# Patient Record
Sex: Female | Born: 1955 | Race: Black or African American | Hispanic: No | Marital: Single | Smoking: Never smoker
Health system: Southern US, Community
[De-identification: ages and names within clinical notes are randomized; demographics above are authoritative.]

## PROBLEM LIST (undated history)

## (undated) DIAGNOSIS — Z923 Personal history of irradiation: Secondary | ICD-10-CM

## (undated) DIAGNOSIS — M1990 Unspecified osteoarthritis, unspecified site: Secondary | ICD-10-CM

## (undated) DIAGNOSIS — E119 Type 2 diabetes mellitus without complications: Secondary | ICD-10-CM

## (undated) DIAGNOSIS — I10 Essential (primary) hypertension: Secondary | ICD-10-CM

## (undated) DIAGNOSIS — Z9221 Personal history of antineoplastic chemotherapy: Secondary | ICD-10-CM

## (undated) DIAGNOSIS — C801 Malignant (primary) neoplasm, unspecified: Secondary | ICD-10-CM

## (undated) DIAGNOSIS — C50919 Malignant neoplasm of unspecified site of unspecified female breast: Secondary | ICD-10-CM

## (undated) DIAGNOSIS — R519 Headache, unspecified: Secondary | ICD-10-CM

## (undated) DIAGNOSIS — K219 Gastro-esophageal reflux disease without esophagitis: Secondary | ICD-10-CM

## (undated) DIAGNOSIS — Z9289 Personal history of other medical treatment: Secondary | ICD-10-CM

## (undated) HISTORY — DX: Personal history of other medical treatment: Z92.89

## (undated) HISTORY — DX: Malignant neoplasm of unspecified site of unspecified female breast: C50.919

## (undated) HISTORY — DX: Type 2 diabetes mellitus without complications: E11.9

## (undated) HISTORY — DX: Essential (primary) hypertension: I10

## (undated) HISTORY — DX: Unspecified osteoarthritis, unspecified site: M19.90

---

## 2020-01-11 ENCOUNTER — Other Ambulatory Visit: Payer: Self-pay | Admitting: Family Medicine

## 2020-01-11 DIAGNOSIS — N6311 Unspecified lump in the right breast, upper outer quadrant: Secondary | ICD-10-CM

## 2020-01-18 ENCOUNTER — Other Ambulatory Visit: Payer: Self-pay

## 2020-01-18 DIAGNOSIS — N6310 Unspecified lump in the right breast, unspecified quadrant: Secondary | ICD-10-CM

## 2020-01-24 ENCOUNTER — Ambulatory Visit: Payer: Self-pay | Admitting: *Deleted

## 2020-01-24 ENCOUNTER — Other Ambulatory Visit: Payer: Self-pay

## 2020-01-24 VITALS — BP 122/86 | Temp 97.3°F | Wt 218.2 lb

## 2020-01-24 DIAGNOSIS — N6311 Unspecified lump in the right breast, upper outer quadrant: Secondary | ICD-10-CM

## 2020-01-24 DIAGNOSIS — N6315 Unspecified lump in the right breast, overlapping quadrants: Secondary | ICD-10-CM

## 2020-01-24 DIAGNOSIS — N644 Mastodynia: Secondary | ICD-10-CM

## 2020-01-24 DIAGNOSIS — Z1239 Encounter for other screening for malignant neoplasm of breast: Secondary | ICD-10-CM

## 2020-01-24 NOTE — Patient Instructions (Signed)
Explained breast self awareness with [name]. Patient did not need a Pap smear today due to last Pap smear and HPV typing was in 2017 per patient. Let her know [facility] will cover Pap smears and HPV typing every 5 years unless has a history of abnormal Pap smears. Referred patient to the [facility] for a diagnostic mammogram. Appointment scheduled Tuesday, January 31, 2020 at 1000. Patient aware of appointment and will be there. [name] verbalized understanding.  Shayleen Eppinger, Arvil Chaco, RN 1:17 PM

## 2020-01-24 NOTE — Progress Notes (Signed)
[name] is a 64 y.o. female who presents to [facility] clinic today with complaint of right breast lump and right axillary pain x 3 months. Patient states the pain comes and goes. Patient rates her pain at a 5 out of 10.    Pap Smear: Pap smear not completed today. Last Pap smear was in 2017 at a clinic in the [place] and was normalwith negative HPV per patient. Per patient has no history of an abnormal Pap smear. Last Pap smear result is not available in Epic.   Physical exam: Breasts Breasts symmetrical. No skin abnormalities bilateral breasts. No nipple retraction bilateral breasts. No nipple discharge bilateral breasts. No lymphadenopathy. No lumps palpated left breast. Palpated two lumps within the right breast at 12 o'clock 1 cm from the nipple and a mobile lump at 11 o'clock 1.5 cm from the nipple. Complaints of right outer breast and axillary tenderness on exam.      Pelvic/Bimanual Pap is not indicated today per [facility] guidelines.   Smoking History: Patient has never smoked.   Patient Navigation: Patient education provided. Access to services provided for patient through [facility] program.   Colorectal Cancer Screening: Per patient has never had colonoscopy completed. No complaints today.    Breast and Cervical Cancer Risk Assessment: Patient does not have family history of breast cancer, known genetic mutations, or radiation treatment to the chest before age 80. Patient does not have history of cervical dysplasia, immunocompromised, or DES exposure in-utero.  Risk Assessment    Risk Scores      01/24/2020   Last edited by: Demetrius Revel, LPN   5-year risk: 1.5 %   Lifetime risk: 6.7 %          A: [facility] exam without pap smear Complaint of right breast lump and pain x 3 months.  P: Referred patient to the [facility] for a diagnostic mammogram. Appointment scheduled Tuesday, January 31, 2020 at 1000.  Loletta Parish, RN 01/24/2020 1:17 PM

## 2020-01-31 ENCOUNTER — Other Ambulatory Visit: Payer: Self-pay | Admitting: Obstetrics and Gynecology

## 2020-01-31 ENCOUNTER — Other Ambulatory Visit: Payer: Self-pay

## 2020-01-31 ENCOUNTER — Ambulatory Visit: Payer: Self-pay

## 2020-01-31 ENCOUNTER — Ambulatory Visit
Admission: RE | Admit: 2020-01-31 | Discharge: 2020-01-31 | Disposition: A | Discharge status: Home / Self Care | Payer: No Typology Code available for payment source | Source: Ambulatory Visit | Attending: Obstetrics and Gynecology | Admitting: Obstetrics and Gynecology

## 2020-01-31 ENCOUNTER — Ambulatory Visit
Admission: RE | Admit: 2020-01-31 | Discharge: 2020-01-31 | Disposition: A | Discharge status: Home / Self Care | Payer: Self-pay | Source: Ambulatory Visit | Attending: Obstetrics and Gynecology | Admitting: Obstetrics and Gynecology

## 2020-01-31 DIAGNOSIS — R928 Other abnormal and inconclusive findings on diagnostic imaging of breast: Secondary | ICD-10-CM

## 2020-01-31 DIAGNOSIS — N6310 Unspecified lump in the right breast, unspecified quadrant: Secondary | ICD-10-CM

## 2020-01-31 IMAGING — US US BREAST*L* LIMITED INC AXILLA
1 series · 5 of 5 positions shown · non-contrast
Comparison: None.

CLINICAL DATA: Patient presents for palpable mass within the right
axilla.

EXAM:
DIGITAL DIAGNOSTIC BILATERAL MAMMOGRAM WITH CAD AND TOMO
ULTRASOUND BILATERAL BREAST

[Series 1: us breast*left* limited inc axilla · 0.06mm/px · 5 of 5 slices shown]
[im 1/5]
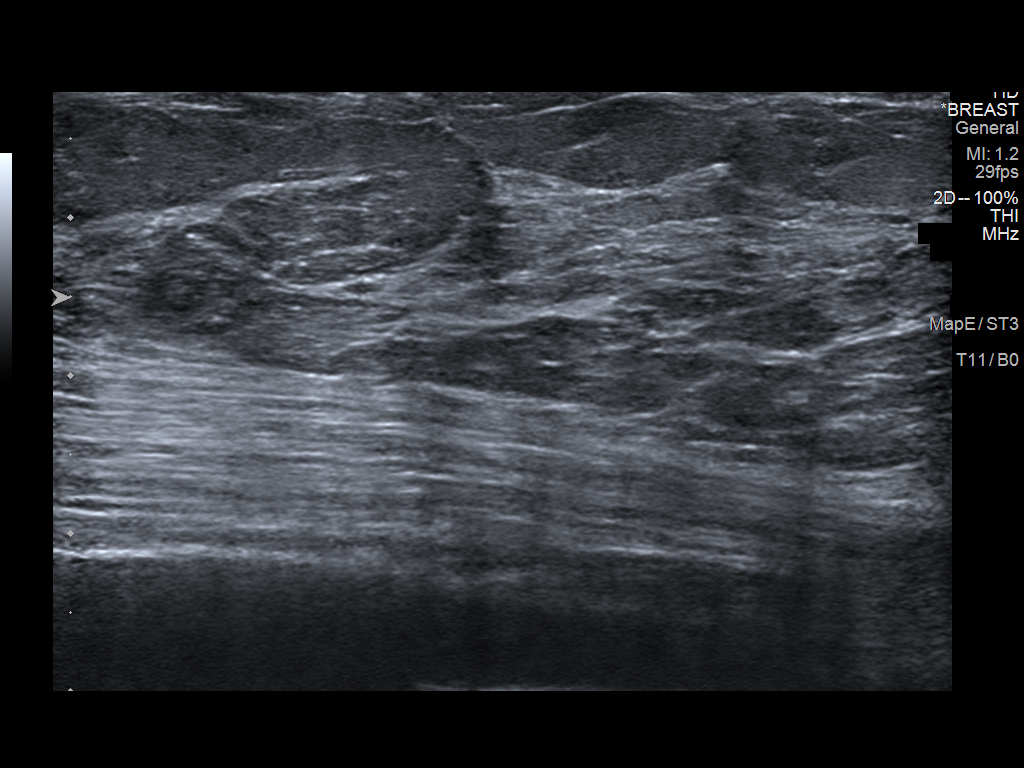
[im 2/5]
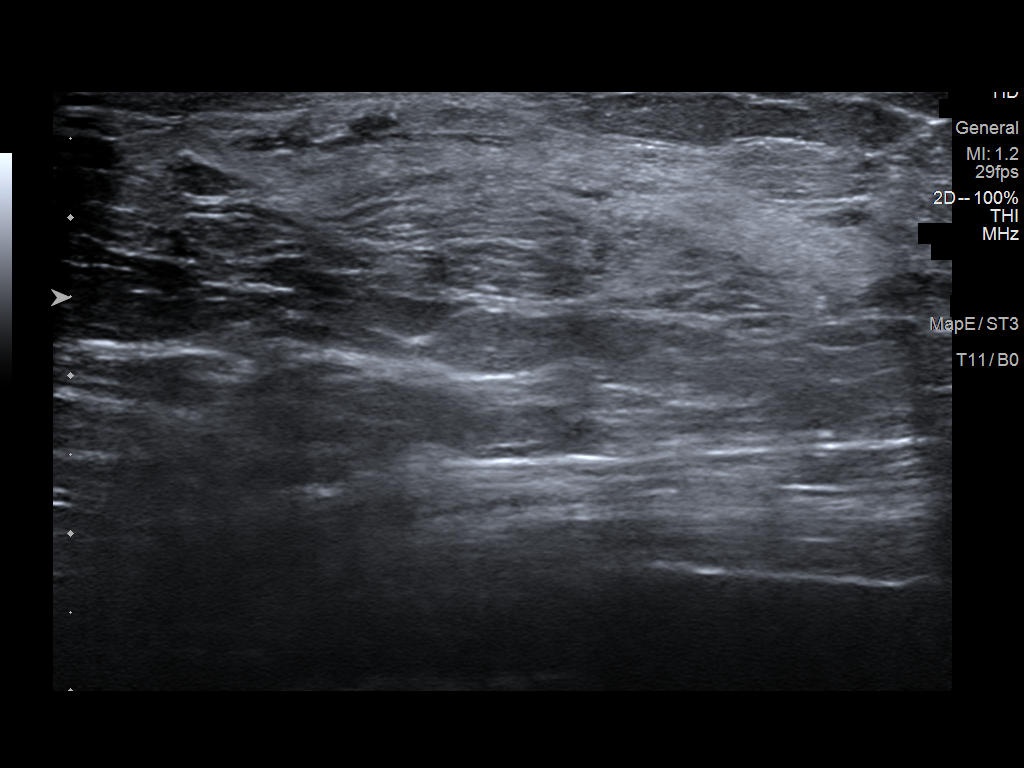
[im 3/5]
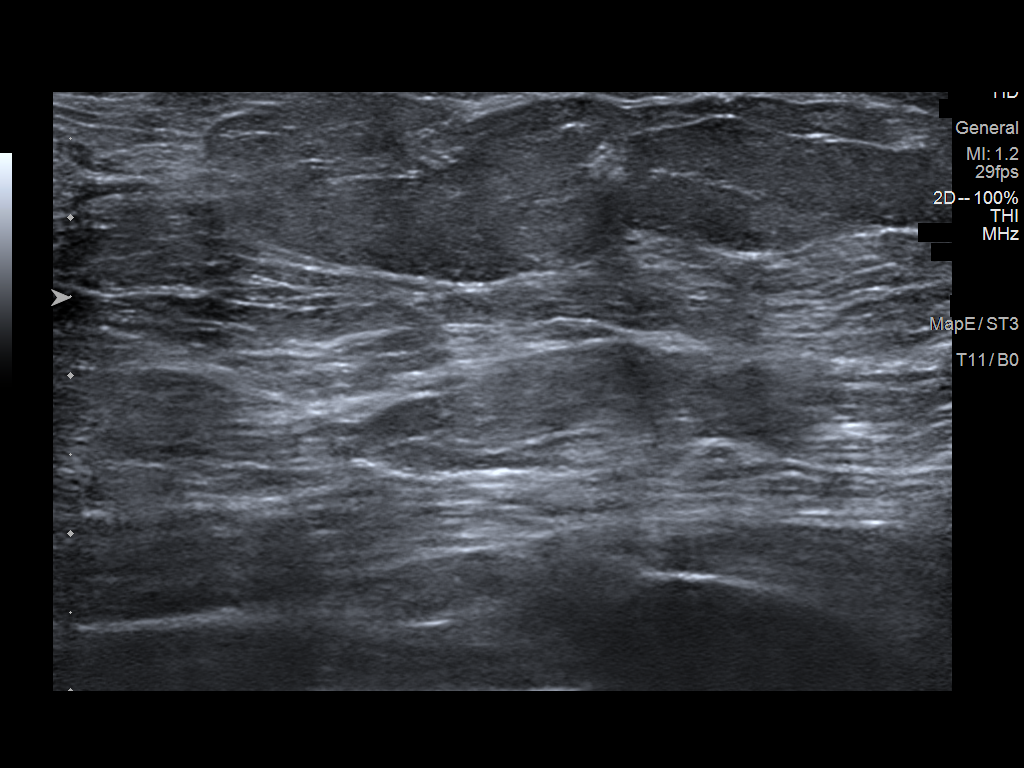
[im 4/5]
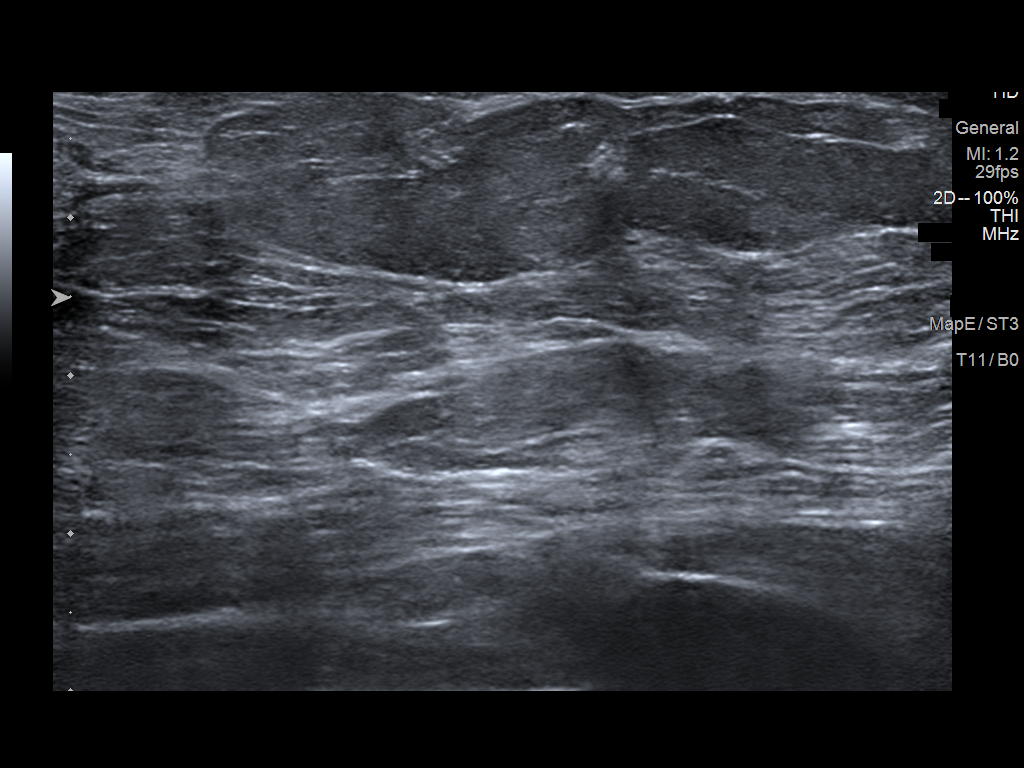
[im 5/5]
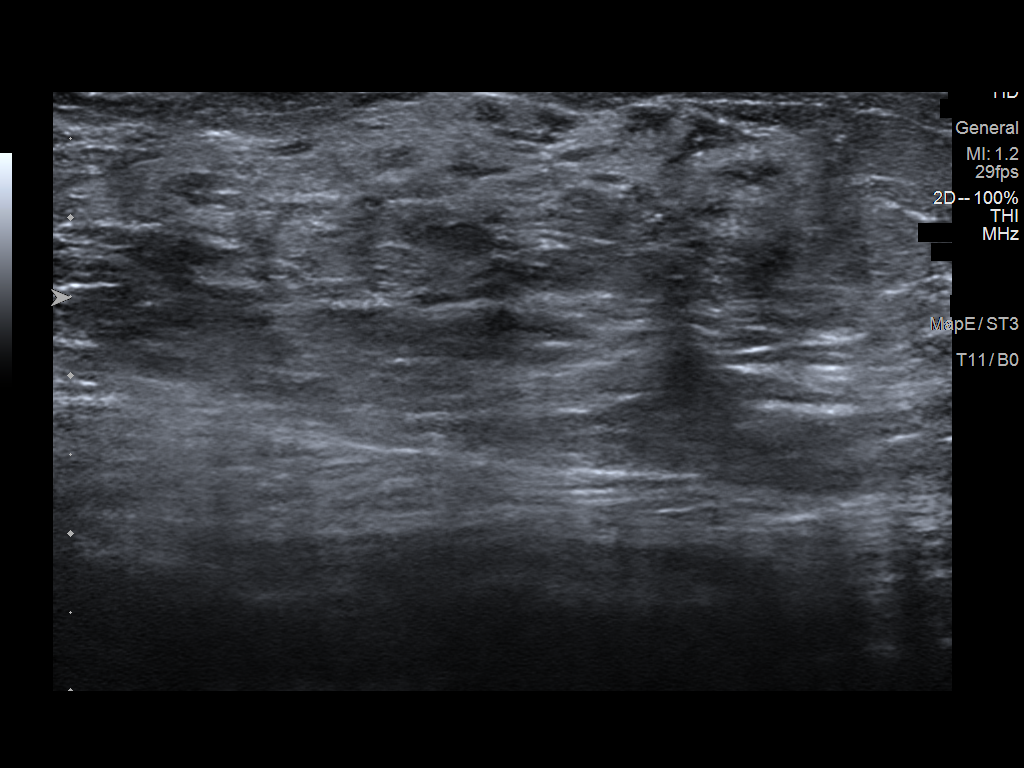

[5 of 5 positions shown; findings below may reference images not displayed]

ACR Breast Density Category b: There are scattered areas of
fibroglandular density.
FINDINGS: Underlying the palpable marker within the right axilla are 2
adjacent enlarged lymph nodes.

Within the anterior central right breast there are 2 adjacent
irregular masses measuring 1.6 and 2.0 cm.

Within the upper inner right breast posterior depth there is a
persistent small lobular mass.

Questioned asymmetry within the outer right breast appeared to
resolve with additional imaging.

Questioned asymmetry within the anterior left breast appeared to
resolve with additional imaging.

Mammographic images were processed with CAD.

Targeted ultrasound is performed, showing a 1.9 x 1.7 x 1.7 cm
irregular hypoechoic right breast mass 12 o'clock position 1 cm from
nipple.

There is an adjacent 2.0 x 2.3 x 1.0 cm irregular hypoechoic mass
right breast 11 o'clock position 3 cm from nipple.

There is a 0.4 x 0.3 x 0.3 cm irregular hypoechoic mass right breast
1 o'clock position 8 cm from nipple.

There are 2 adjacent enlarged thickened lymph nodes measuring up to
2.1 cm.

No suspicious abnormality within the retroareolar left breast. Dense
tissue is visualized.
IMPRESSION: 1. There are 3 suspicious masses within the right breast concerning
primary breast malignancy.
2. Two adjacent enlarged thickened right axillary lymph nodes
concerning for metastatic adenopathy.

RECOMMENDATION:
Ultrasound-guided core needle biopsy of the 3 right breast masses
and 1 of the thickened right axillary lymph nodes.

Patient will be scheduled for 2 separate days.

On the first biopsy day, recommend biopsy of the right breast mass
12 o'clock position and 1 of the thickened right axillary lymph
nodes.

On the second biopsy day, recommend biopsy of the right breast
masses 11 o'clock position and 1 o'clock position.

I have discussed the findings and recommendations with the patient.
If applicable, a reminder letter will be sent to the patient
regarding the next appointment.

BI-RADS CATEGORY  5: Highly suggestive of malignancy.

## 2020-01-31 IMAGING — MG DIGITAL DIAGNOSTIC BILAT W/ TOMO W/ CAD
8 of 18 series · 8 of 40 positions shown · non-contrast
Comparison: None.

CLINICAL DATA: Patient presents for palpable mass within the right
axilla.

EXAM:
DIGITAL DIAGNOSTIC BILATERAL MAMMOGRAM WITH CAD AND TOMO
ULTRASOUND BILATERAL BREAST

[L MLO synth-2D (1 of 2)]
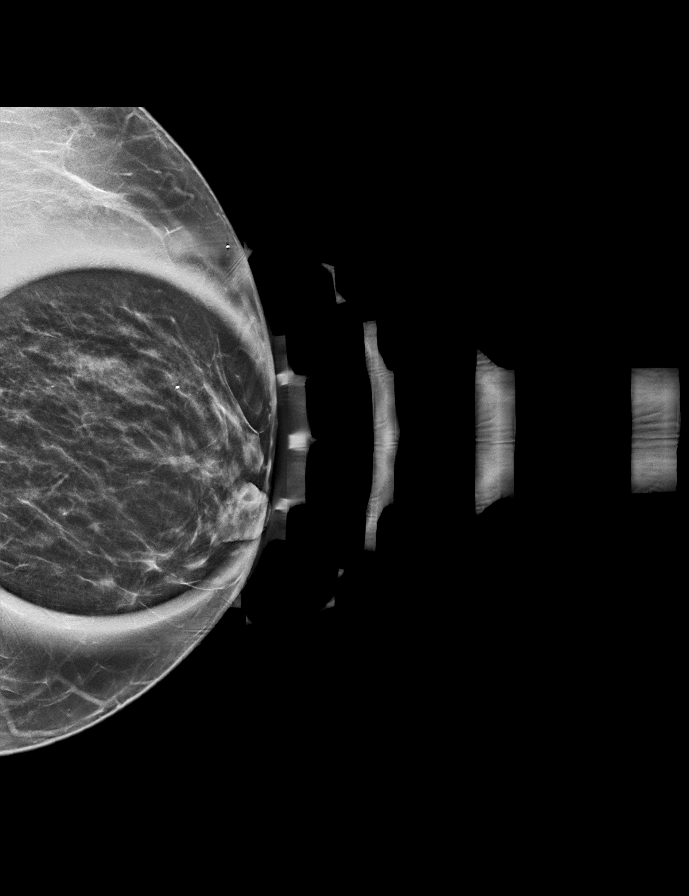

[L ML synth-2D]
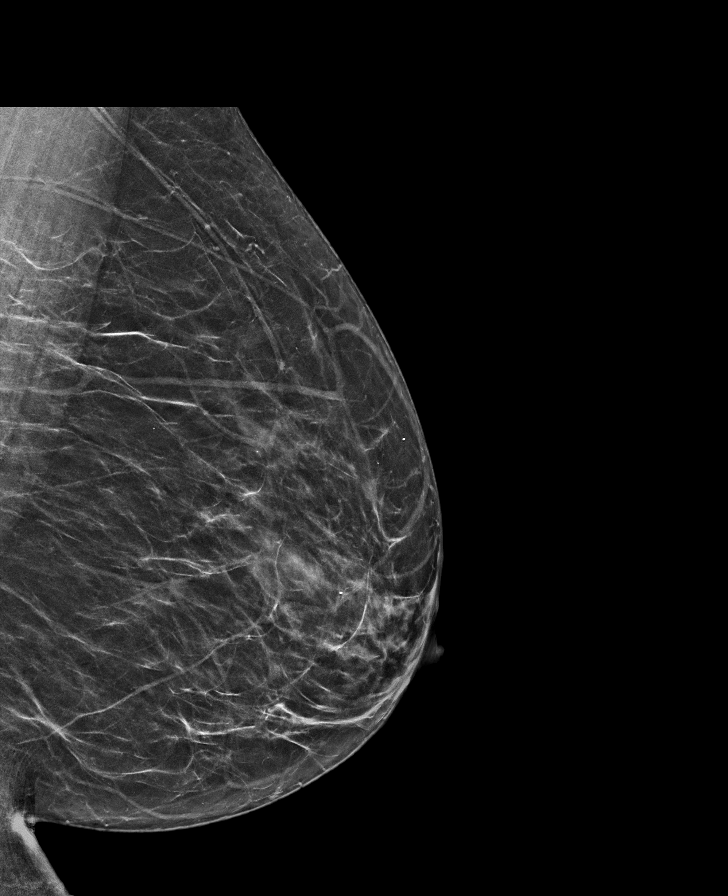

[R TAN synth-2D]
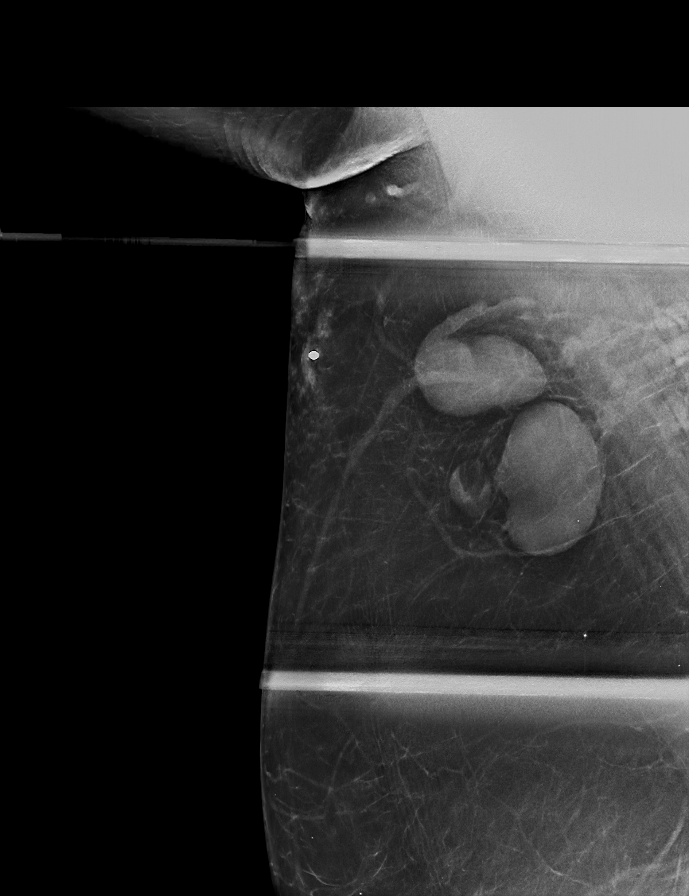

[L MLO synth-2D (2 of 2)]
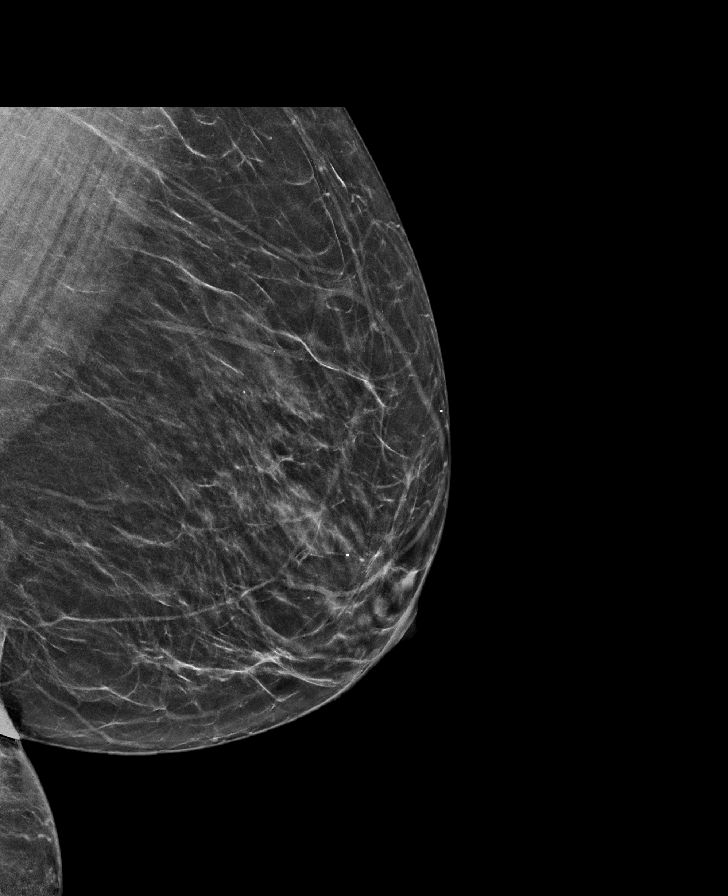

[R CC synth-2D]
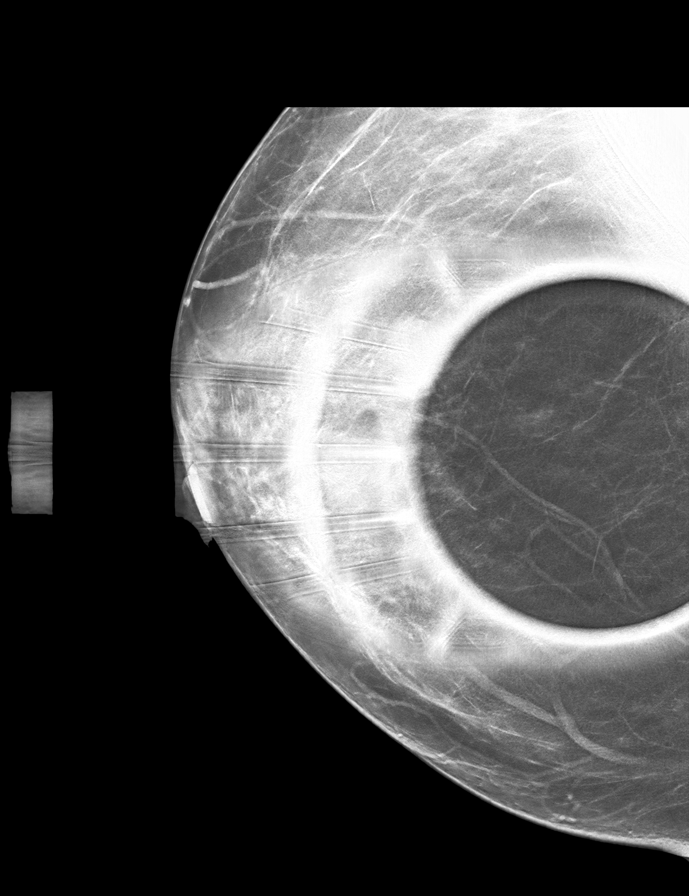

[R MLO synth-2D]
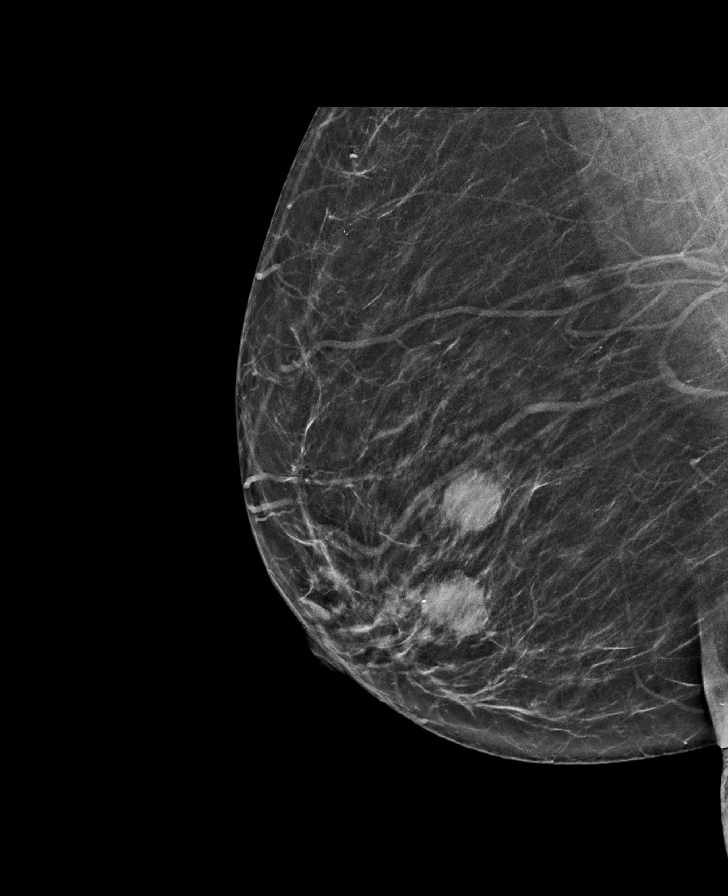

[R ML synth-2D]
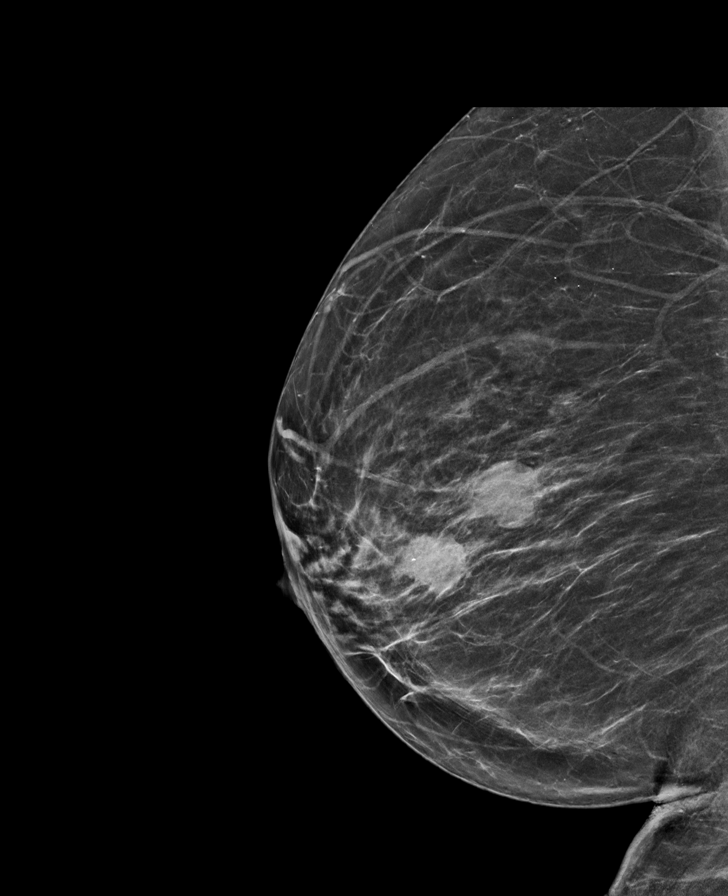

[L CC tomo · tomo slice 31/61.0]
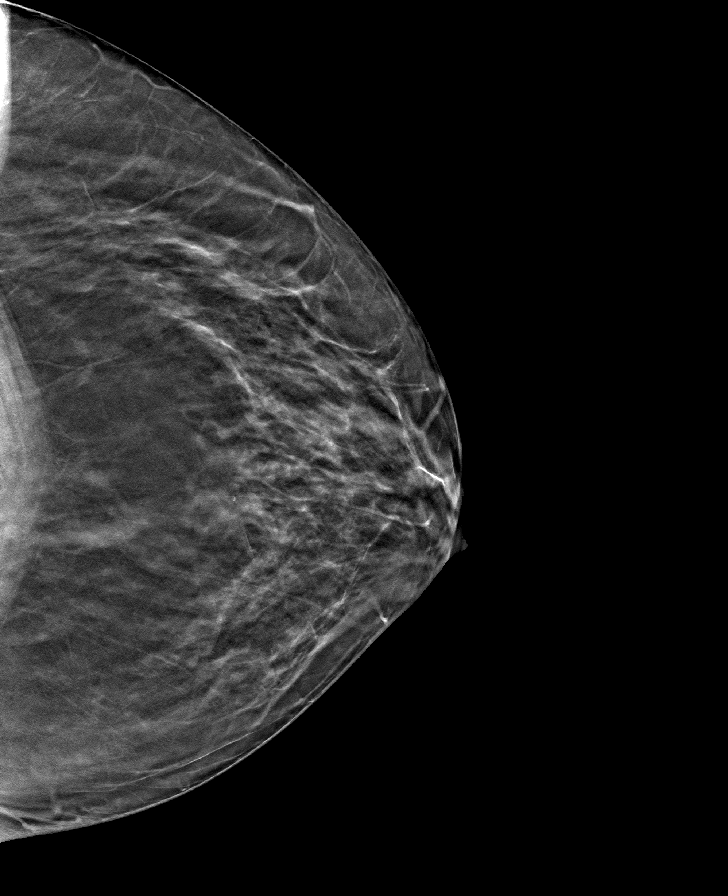

[8 of 40 positions shown; findings below may reference images not displayed]

ACR Breast Density Category b: There are scattered areas of
fibroglandular density.
FINDINGS: Underlying the palpable marker within the right axilla are 2
adjacent enlarged lymph nodes.

Within the anterior central right breast there are 2 adjacent
irregular masses measuring 1.6 and 2.0 cm.

Within the upper inner right breast posterior depth there is a
persistent small lobular mass.

Questioned asymmetry within the outer right breast appeared to
resolve with additional imaging.

Questioned asymmetry within the anterior left breast appeared to
resolve with additional imaging.

Mammographic images were processed with CAD.

Targeted ultrasound is performed, showing a 1.9 x 1.7 x 1.7 cm
irregular hypoechoic right breast mass 12 o'clock position 1 cm from
nipple.

There is an adjacent 2.0 x 2.3 x 1.0 cm irregular hypoechoic mass
right breast 11 o'clock position 3 cm from nipple.

There is a 0.4 x 0.3 x 0.3 cm irregular hypoechoic mass right breast
1 o'clock position 8 cm from nipple.

There are 2 adjacent enlarged thickened lymph nodes measuring up to
2.1 cm.

No suspicious abnormality within the retroareolar left breast. Dense
tissue is visualized.
IMPRESSION: 1. There are 3 suspicious masses within the right breast concerning
primary breast malignancy.
2. Two adjacent enlarged thickened right axillary lymph nodes
concerning for metastatic adenopathy.

RECOMMENDATION:
Ultrasound-guided core needle biopsy of the 3 right breast masses
and 1 of the thickened right axillary lymph nodes.

Patient will be scheduled for 2 separate days.

On the first biopsy day, recommend biopsy of the right breast mass
12 o'clock position and 1 of the thickened right axillary lymph
nodes.

On the second biopsy day, recommend biopsy of the right breast
masses 11 o'clock position and 1 o'clock position.

I have discussed the findings and recommendations with the patient.
If applicable, a reminder letter will be sent to the patient
regarding the next appointment.

BI-RADS CATEGORY  5: Highly suggestive of malignancy.

## 2020-02-03 ENCOUNTER — Ambulatory Visit
Admission: RE | Admit: 2020-02-03 | Discharge: 2020-02-03 | Disposition: A | Discharge status: Home / Self Care | Payer: No Typology Code available for payment source | Source: Ambulatory Visit | Attending: Obstetrics and Gynecology | Admitting: Obstetrics and Gynecology

## 2020-02-03 ENCOUNTER — Other Ambulatory Visit: Payer: Self-pay | Admitting: Obstetrics and Gynecology

## 2020-02-03 ENCOUNTER — Other Ambulatory Visit: Payer: Self-pay

## 2020-02-03 DIAGNOSIS — N6310 Unspecified lump in the right breast, unspecified quadrant: Secondary | ICD-10-CM

## 2020-02-03 HISTORY — PX: BREAST BIOPSY: SHX20

## 2020-02-03 IMAGING — US US  BREAST BX W/ LOC DEV 1ST LESION IMG BX SPEC US GUIDE*R*
1 series · 16 of 20 positions shown · non-contrast
Comparison: Previous exam(s).
COMPARISON: Previous exam(s).

Addendum:
CLINICAL DATA: Suspicious mass in the 12 o'clock region of the
right breast and suspicious right axillary lymph node.

EXAM:
ULTRASOUND GUIDED RIGHT BREAST CORE NEEDLE BIOPSIES

[Series 1: us breast bx w/ loc dev 1st lesion img bx spec us  · 0.09mm/px · 16 of 20 slices shown]
[im 1/20]
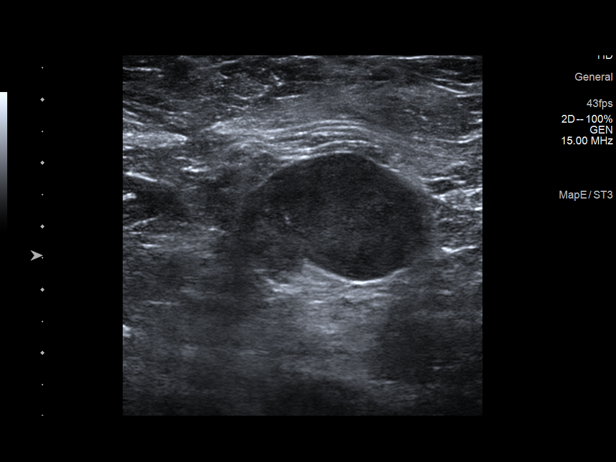
[im 2/20]
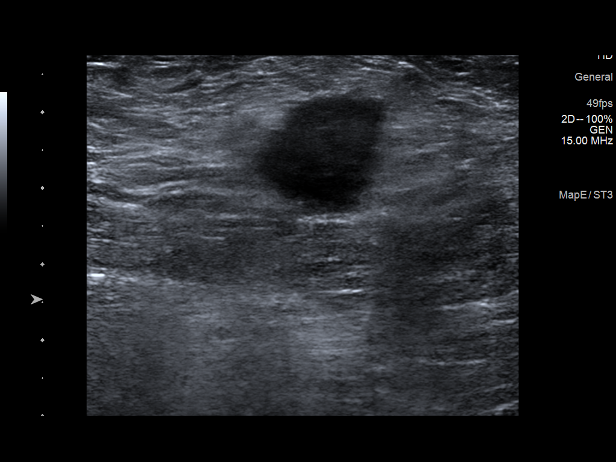
[im 4/20]
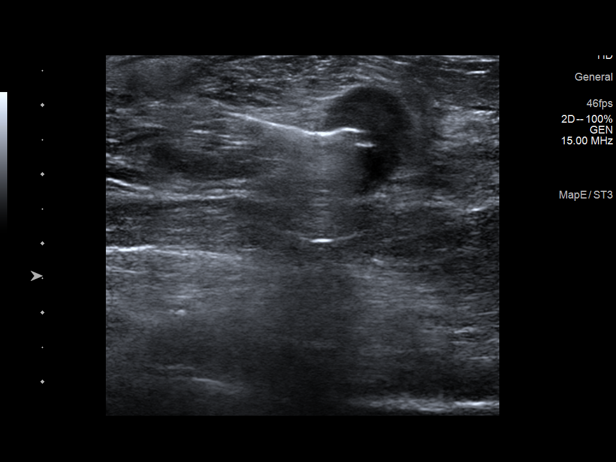
[im 5/20]
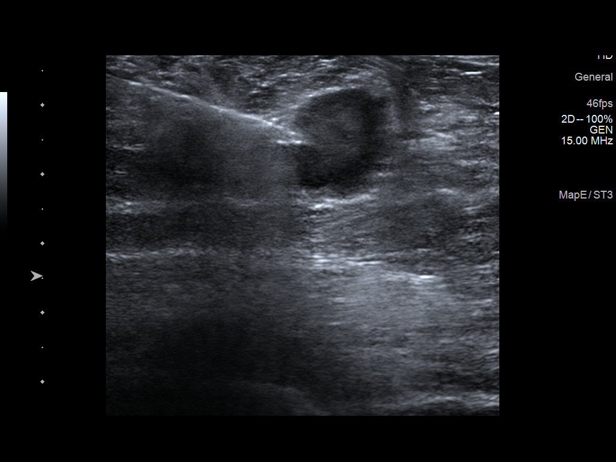
[im 6/20]
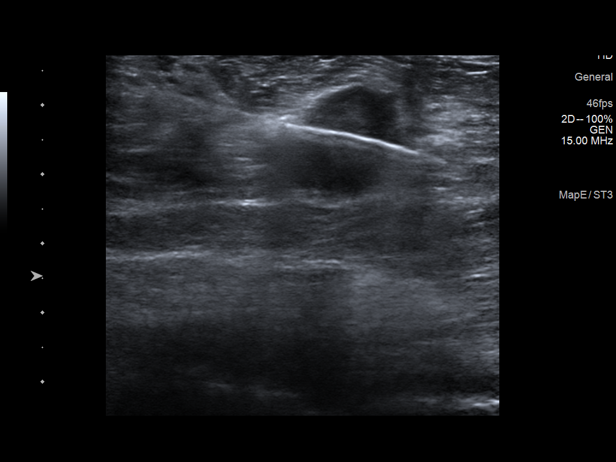
[im 7/20]
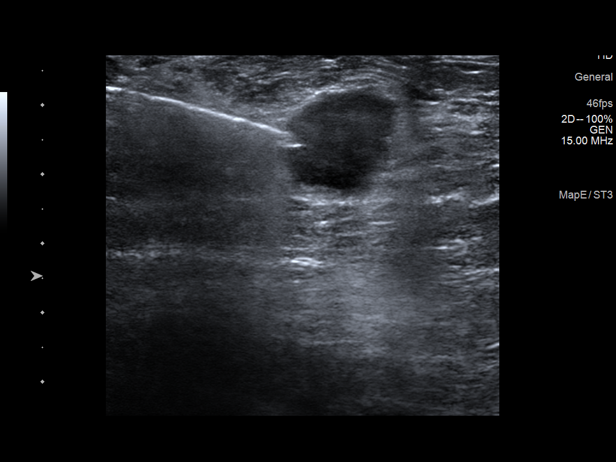
[im 9/20]
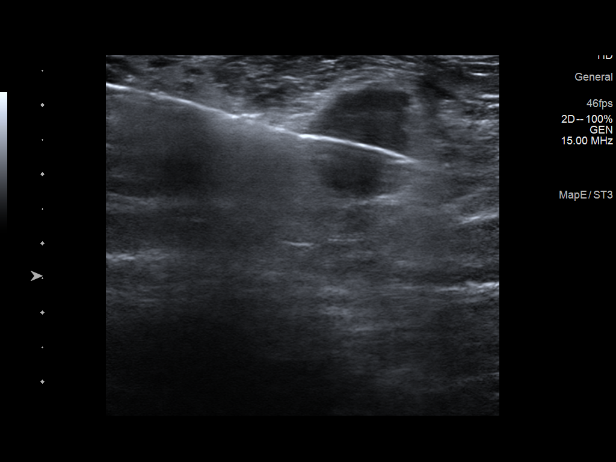
[im 10/20]
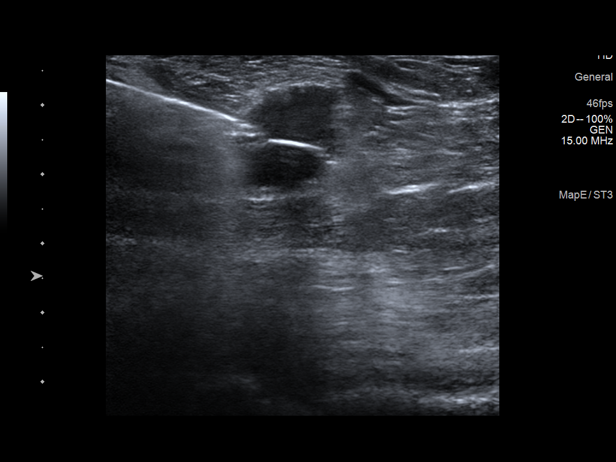
[im 11/20]
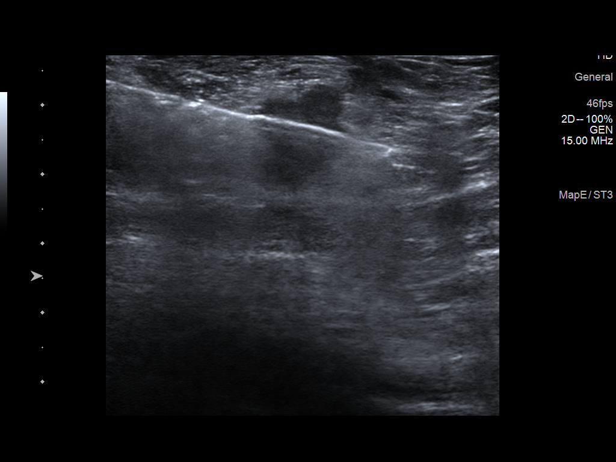
[im 12/20]
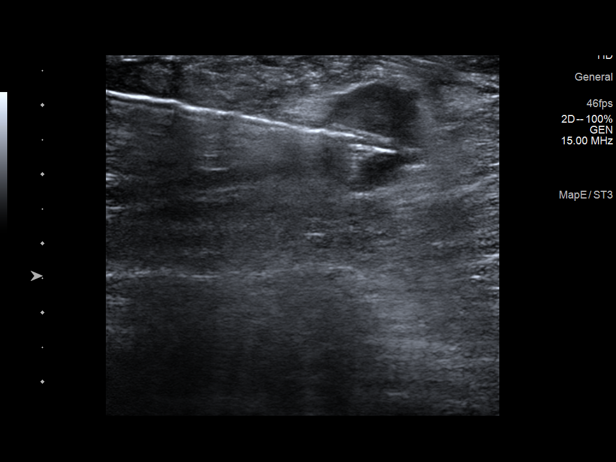
[im 14/20]
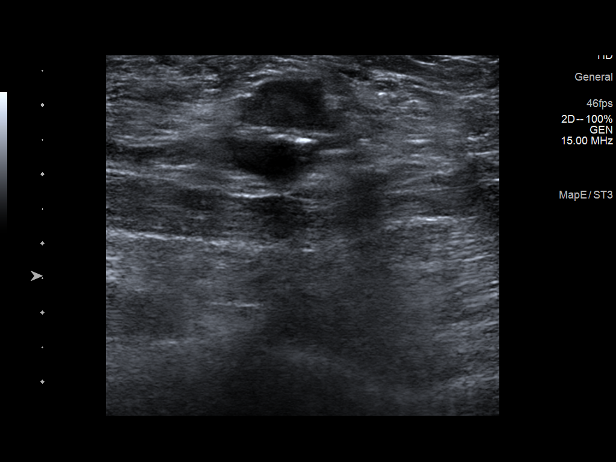
[im 15/20]
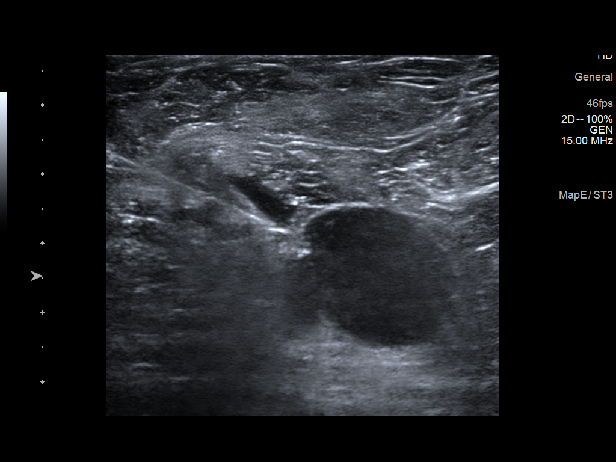
[im 16/20]
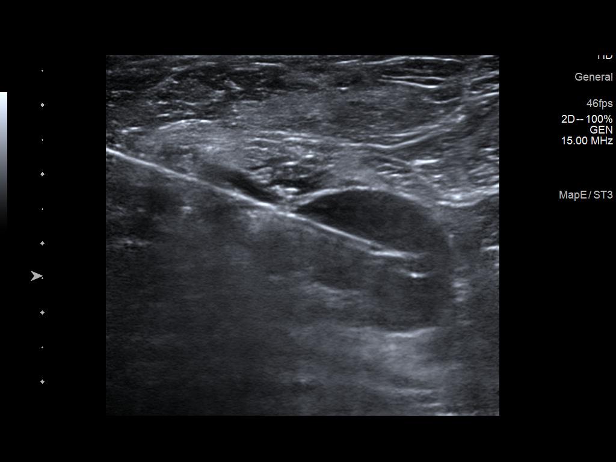
[im 17/20]
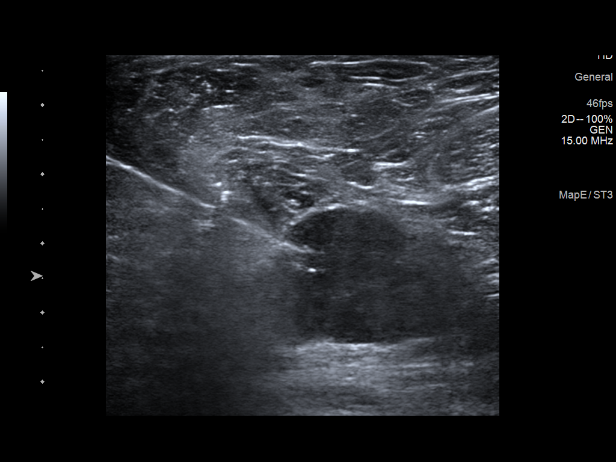
[im 19/20]
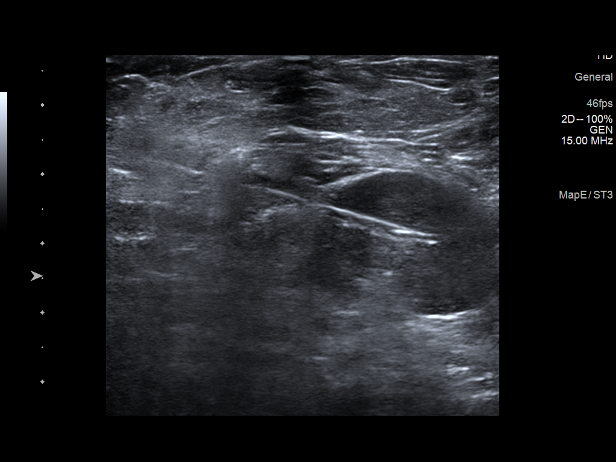
[im 20/20]
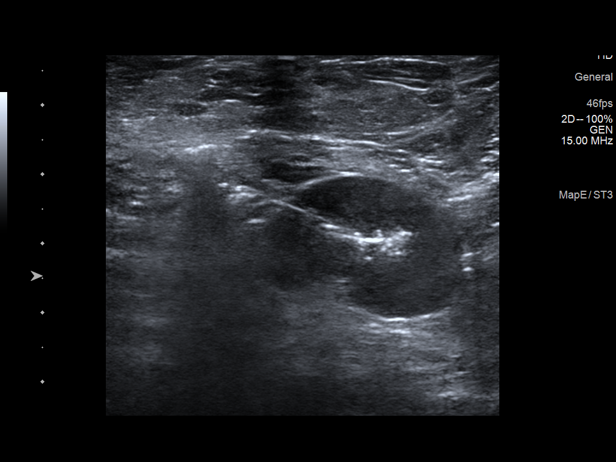

[16 of 20 positions shown; findings below may reference images not displayed]



Lesion quadrant: 12 o'clock

Using sterile technique and 1% lidocaine and 1% lidocaine with
epinephrine as local anesthetic, under direct ultrasound
visualization, a 14 gauge JUMPER device was used to perform
biopsy of a mass in the 12 o'clock region of the right breast using
a lateral to medial approach. At the conclusion of the procedure a
ribbon shaped tissue marker clip was deployed into the biopsy
cavity. Follow up 2 view mammogram was performed and dictated
separately.

I met with the patient and we discussed the procedure of
ultrasound-guided biopsy, including benefits and alternatives. We
discussed the high likelihood of a successful procedure. We
discussed the risks of the procedure, including infection, bleeding,
tissue injury, clip migration, and inadequate sampling. Informed
written consent was given. The usual time-out protocol was performed
immediately prior to the procedure.

Lesion quadrant: Right axilla

Using sterile technique and 1% lidocaine and 1% lidocaine with
epinephrine as local anesthetic, under direct ultrasound
visualization, a 14 gauge JUMPER device was used to perform
biopsy of a right axillary lymph node using a lateral to medial
approach. At the conclusion of the procedure HydroMARK tissue marker
clip was deployed into the biopsy cavity. Follow up 2 view mammogram
was performed and dictated separately.
IMPRESSION: Ultrasound guided biopsies of a mass in the 12 o'clock region of the
right breast and a right axillary lymph node. No apparent
complications.

ADDENDUM:
Pathology revealed GRADE III INVASIVE DUCTAL CARCINOMA of the RIGHT
breast, 12 o'clock. This was found to be concordant by Dr. JUMPER
JUMPER.

Pathology revealed METASTATIC CARCINOMA INVOLVING A LYMPH NODE of
the RIGHT axilla. This was found to be concordant by Dr. JUMPER.

Pathology results were discussed with the patient by telephone. The
patient reported doing well after the biopsies with tenderness at
the sites. Post biopsy instructions and care were reviewed and
questions were answered. The patient was encouraged to call The

2 additional ultrasound guided biopsies of RIGHT breast are
scheduled for [DATE].

Surgical consultation has been arranged with Dr. JUMPER at
[REDACTED] on [DATE].

Pathology results reported by JUMPER RN on [DATE].



Lesion quadrant: 12 o'clock

Using sterile technique and 1% lidocaine and 1% lidocaine with
epinephrine as local anesthetic, under direct ultrasound
visualization, a 14 gauge JUMPER device was used to perform
biopsy of a mass in the 12 o'clock region of the right breast using
a lateral to medial approach. At the conclusion of the procedure a
ribbon shaped tissue marker clip was deployed into the biopsy
cavity. Follow up 2 view mammogram was performed and dictated
separately.

I met with the patient and we discussed the procedure of
ultrasound-guided biopsy, including benefits and alternatives. We
discussed the high likelihood of a successful procedure. We
discussed the risks of the procedure, including infection, bleeding,
tissue injury, clip migration, and inadequate sampling. Informed
written consent was given. The usual time-out protocol was performed
immediately prior to the procedure.

Lesion quadrant: Right axilla

Using sterile technique and 1% lidocaine and 1% lidocaine with
epinephrine as local anesthetic, under direct ultrasound
visualization, a 14 gauge JUMPER device was used to perform
biopsy of a right axillary lymph node using a lateral to medial
approach. At the conclusion of the procedure HydroMARK tissue marker
clip was deployed into the biopsy cavity. Follow up 2 view mammogram
was performed and dictated separately.
IMPRESSION: Ultrasound guided biopsies of a mass in the 12 o'clock region of the
right breast and a right axillary lymph node. No apparent
complications.

## 2020-02-03 IMAGING — MG MM BREAST LOCALIZATION CLIP
6 of 10 series · 6 of 30 positions shown · non-contrast
Comparison: Previous exam(s).

CLINICAL DATA: Status post ultrasound-guided core biopsies of a
mass in the 12 o'clock region of the right breast and a right
axillary lymph node.

EXAM:
3D DIAGNOSTIC right MAMMOGRAM POST ULTRASOUND BIOPSIES

[R ML synth-2D]
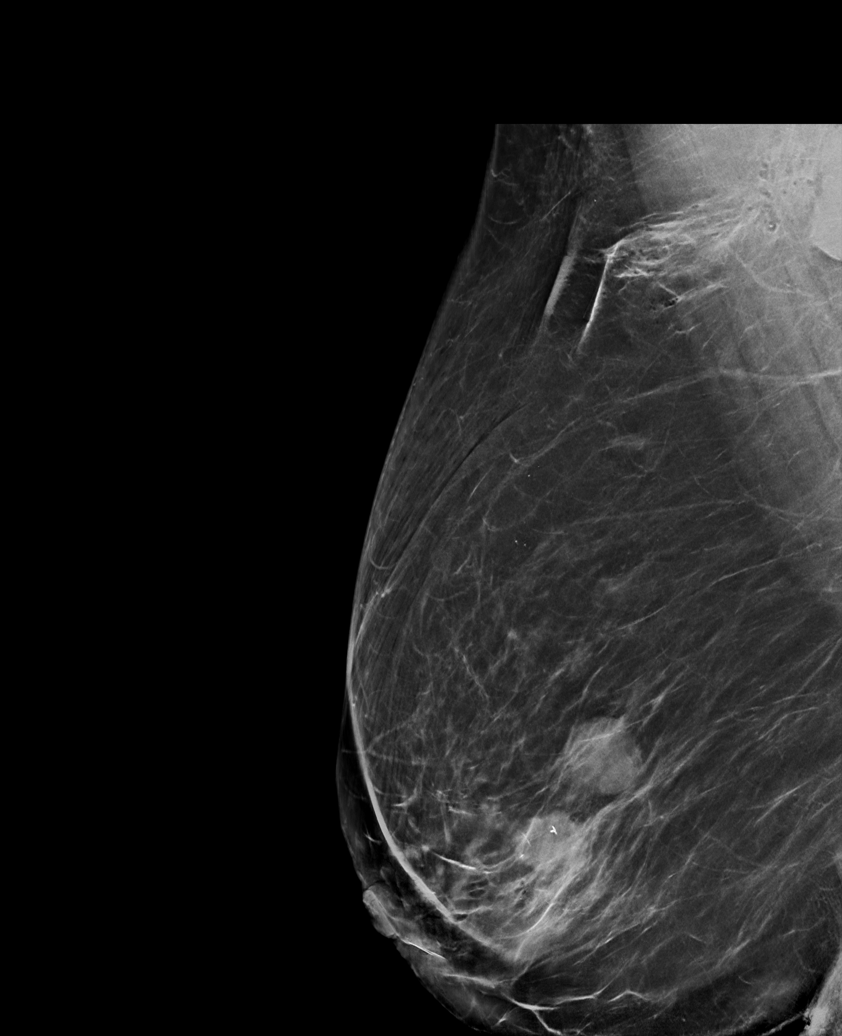

[R CC synth-2D]
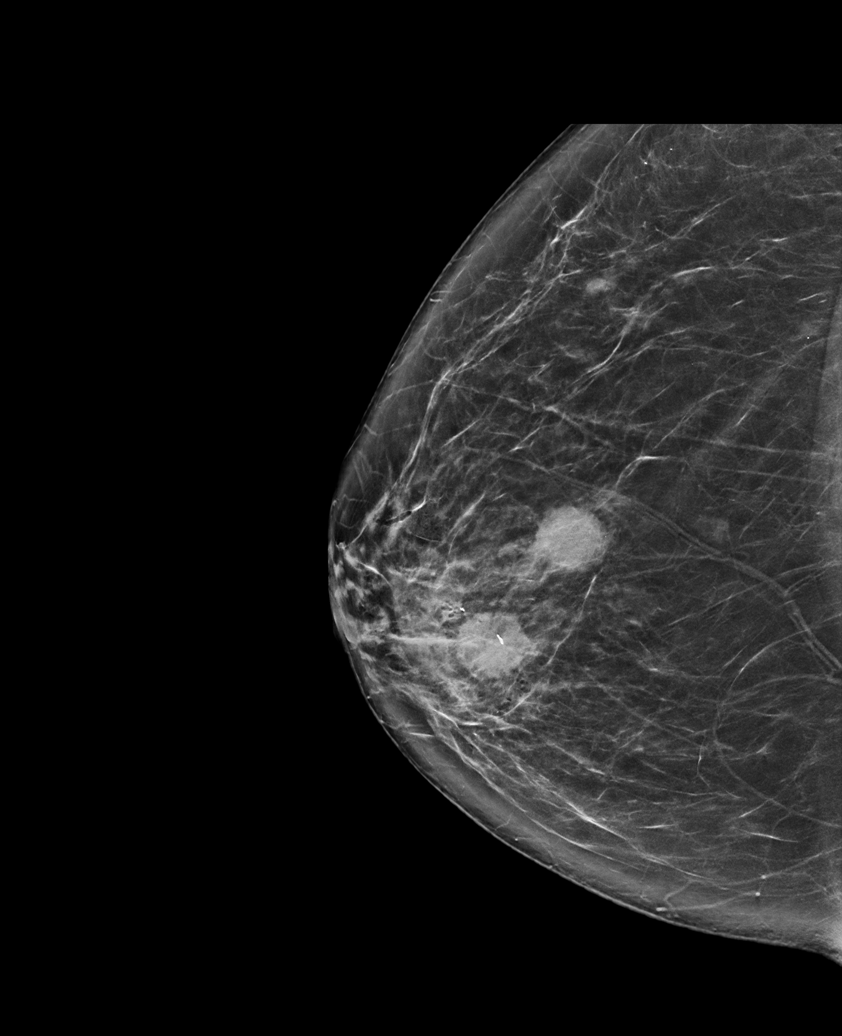

[R TAN synth-2D]
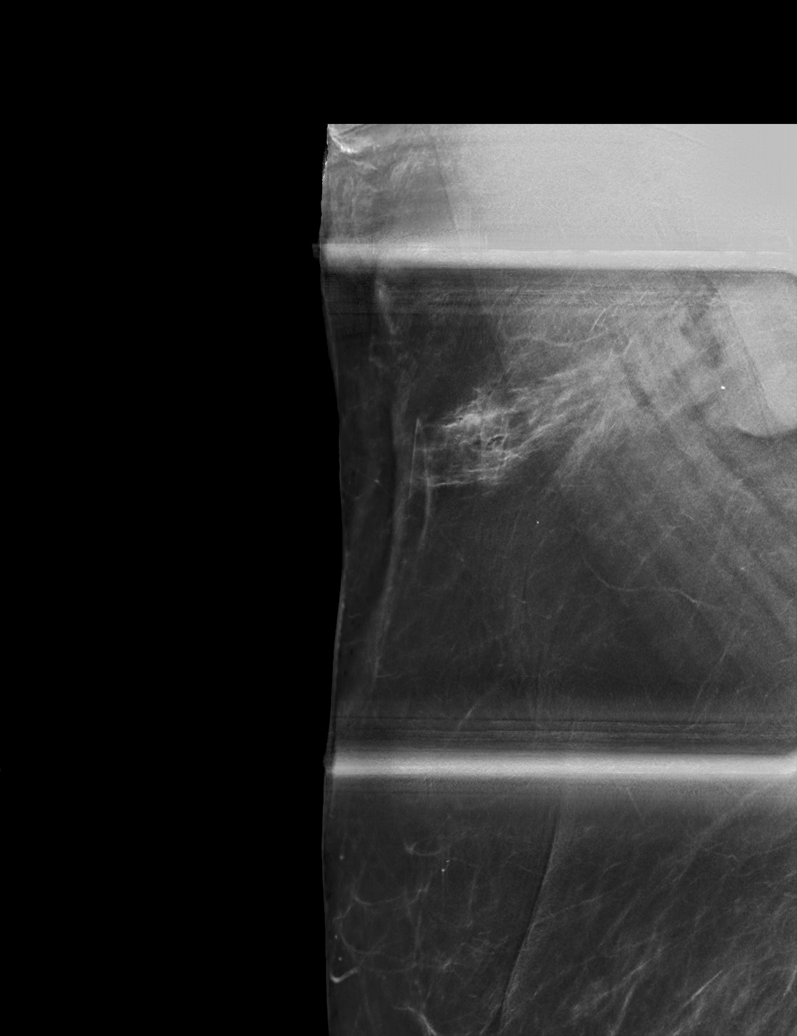

[R MLO synth-2D (1 of 2)]
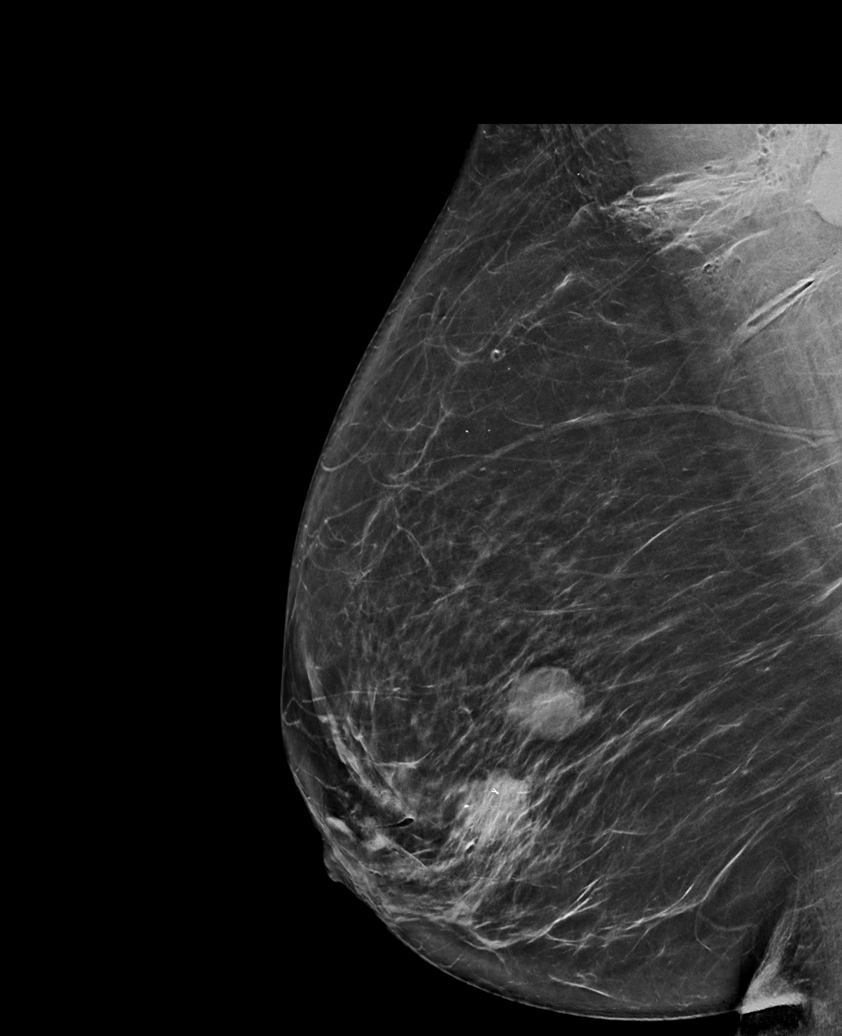

[R MLO synth-2D (2 of 2)]
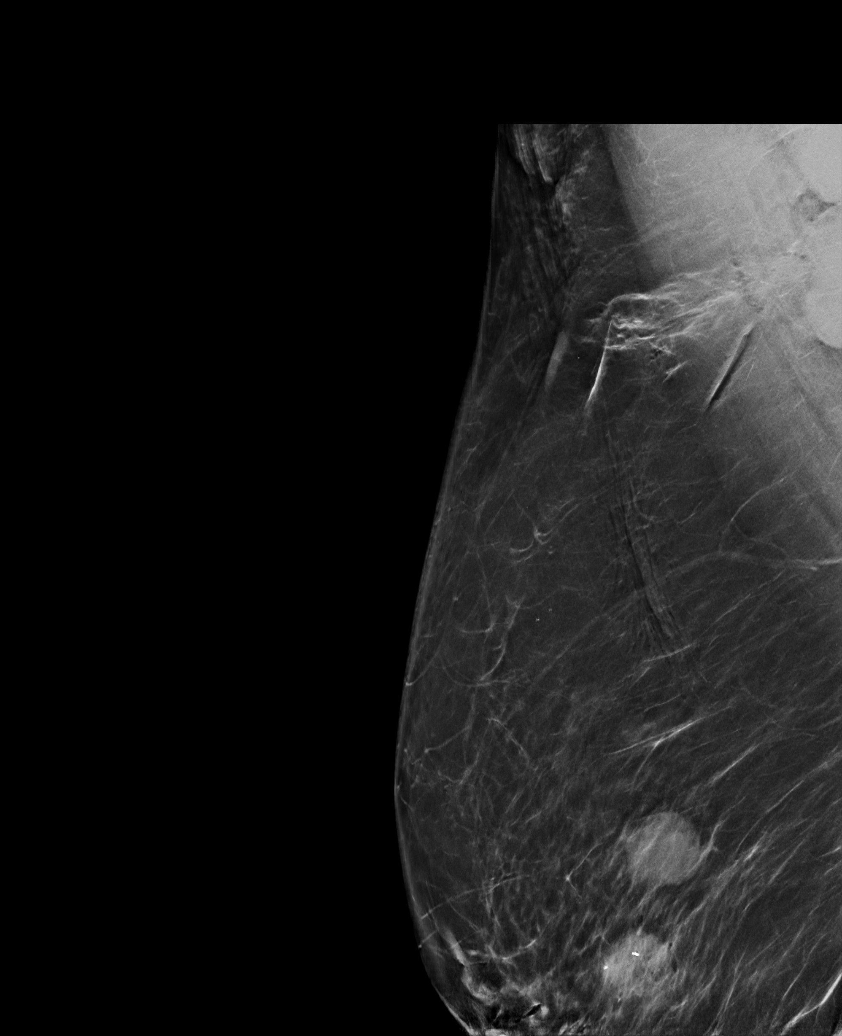

[R CC tomo · tomo slice 41/81.0]
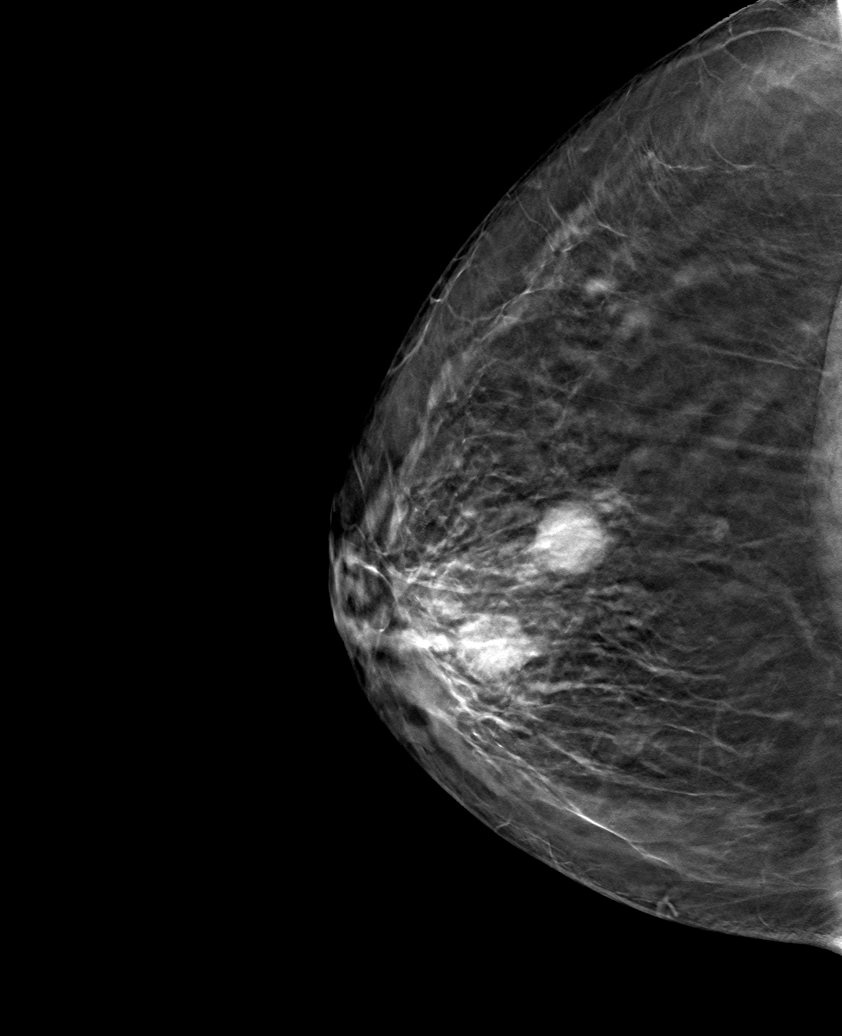

[6 of 30 positions shown; findings below may reference images not displayed]

FINDINGS: 3D Mammographic images were obtained following ultrasound guided
core biopsies of a mass in the 12 o'clock region of the right breast
and a right axillary lymph node. Mammographic images show there is a
ribbon shaped clip in the mass in the 12 o'clock region of the right
breast. The HydroMARK clip in the right axilla is not visualized
secondary to the posterior location of the lymph node.
IMPRESSION: Appropriate positioning of the ribbon shaped clip in the 12 o'clock
region of the right breast. The HydroMARK clip was not visualized
secondary to the posterior location of the lymph node.

Final Assessment: Post Procedure Mammograms for Marker Placement

## 2020-02-06 ENCOUNTER — Other Ambulatory Visit: Payer: Self-pay | Admitting: Obstetrics and Gynecology

## 2020-02-06 ENCOUNTER — Ambulatory Visit
Admission: RE | Admit: 2020-02-06 | Discharge: 2020-02-06 | Disposition: A | Discharge status: Home / Self Care | Payer: No Typology Code available for payment source | Source: Ambulatory Visit | Attending: Obstetrics and Gynecology | Admitting: Obstetrics and Gynecology

## 2020-02-06 ENCOUNTER — Other Ambulatory Visit: Payer: Self-pay

## 2020-02-06 DIAGNOSIS — N6310 Unspecified lump in the right breast, unspecified quadrant: Secondary | ICD-10-CM

## 2020-02-06 HISTORY — PX: BREAST BIOPSY: SHX20

## 2020-02-06 IMAGING — US US  BREAST BX W/ LOC DEV 1ST LESION IMG BX SPEC US GUIDE*R*
1 series · 12 of 19 positions shown · non-contrast
Comparison: Previous exam(s).
COMPARISON: Previous exam(s).

Addendum:
CLINICAL DATA: [age] presenting for ultrasound-guided
biopsy of 2 right breast masses.

EXAM:
ULTRASOUND GUIDED RIGHT BREAST CORE NEEDLE BIOPSY

[Series 1: us breast bx w/ loc dev 1st lesion img bx spec us  · 0.06mm/px · 12 of 19 slices shown]
[im 1/19]
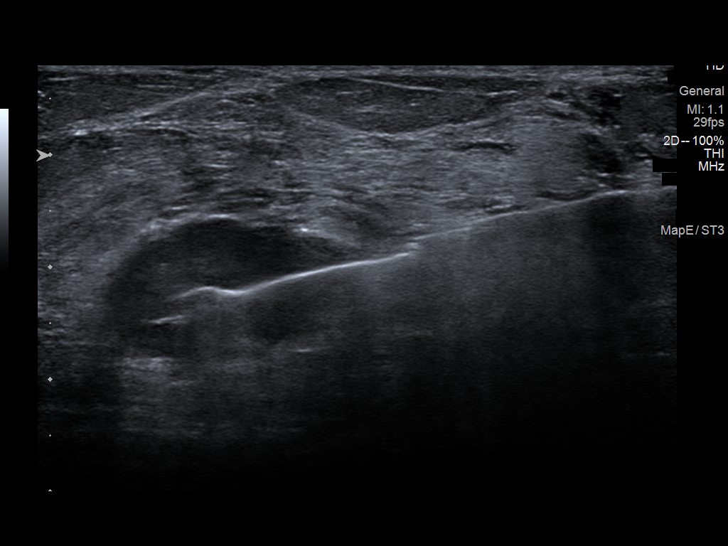
[im 3/19]
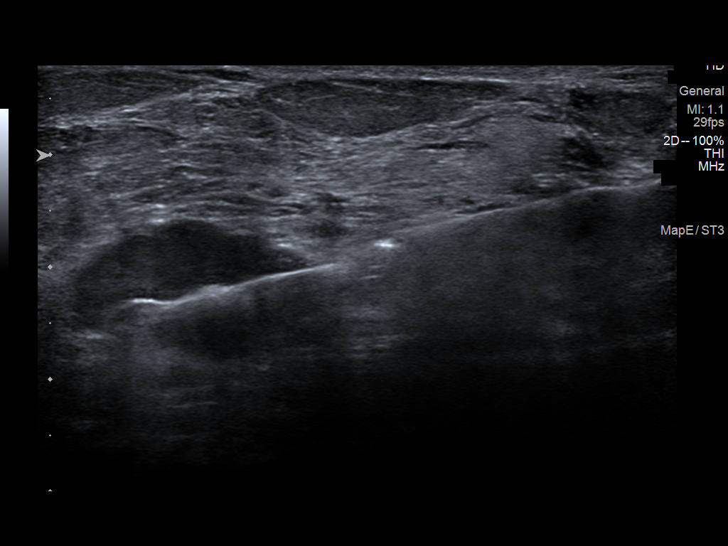
[im 4/19]
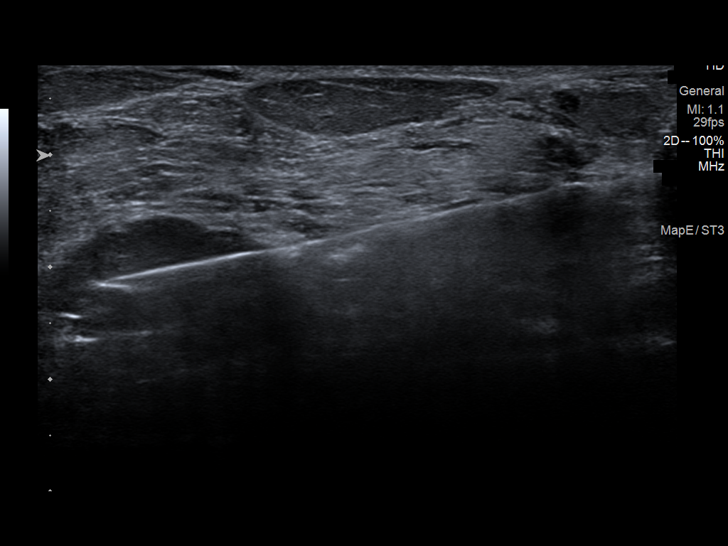
[im 6/19]
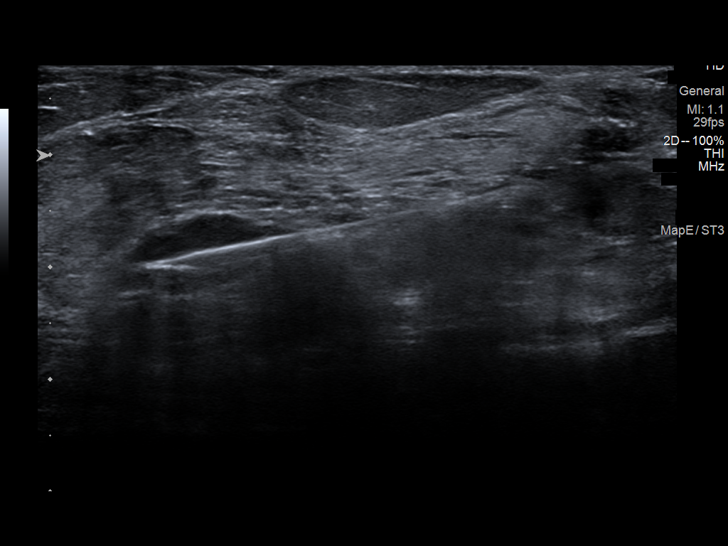
[im 8/19]
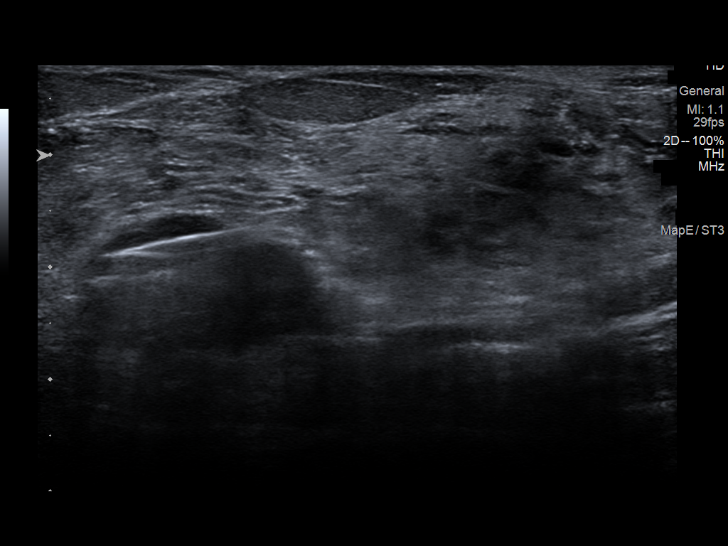
[im 9/19]
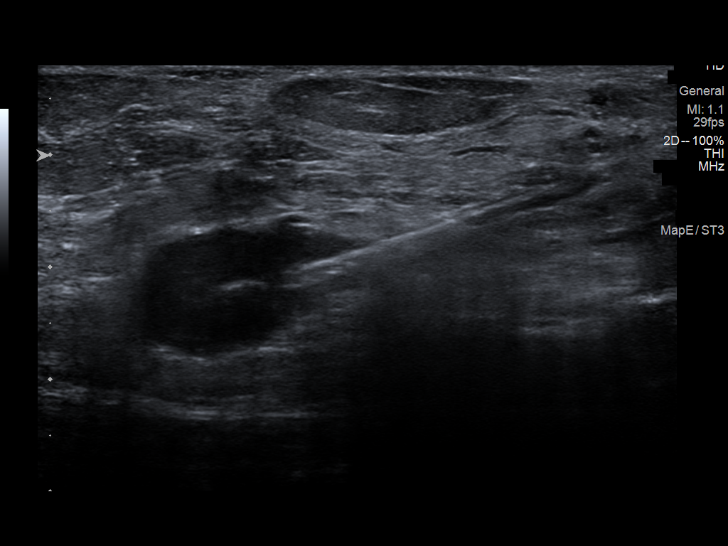
[im 11/19]
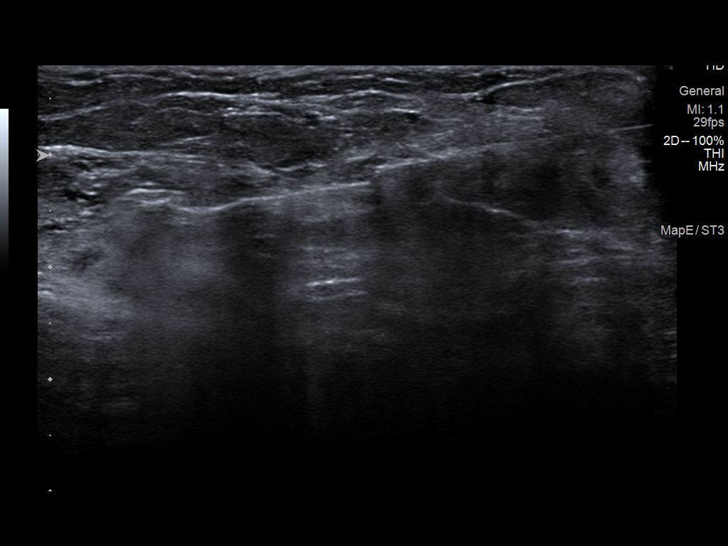
[im 12/19]
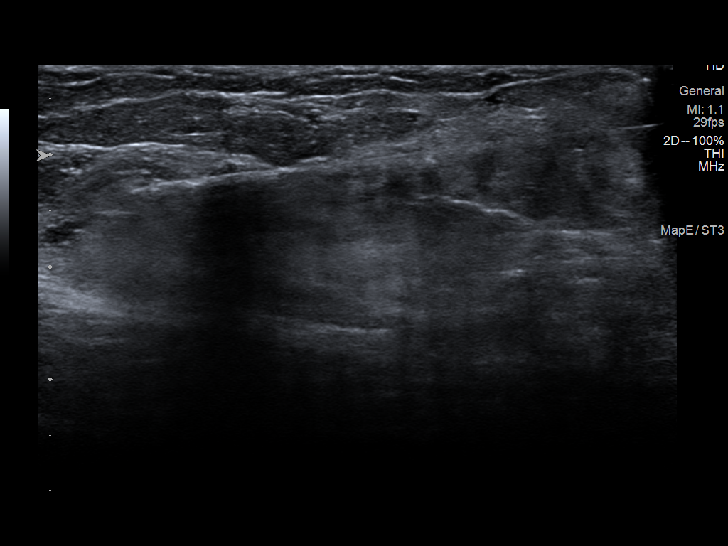
[im 14/19]
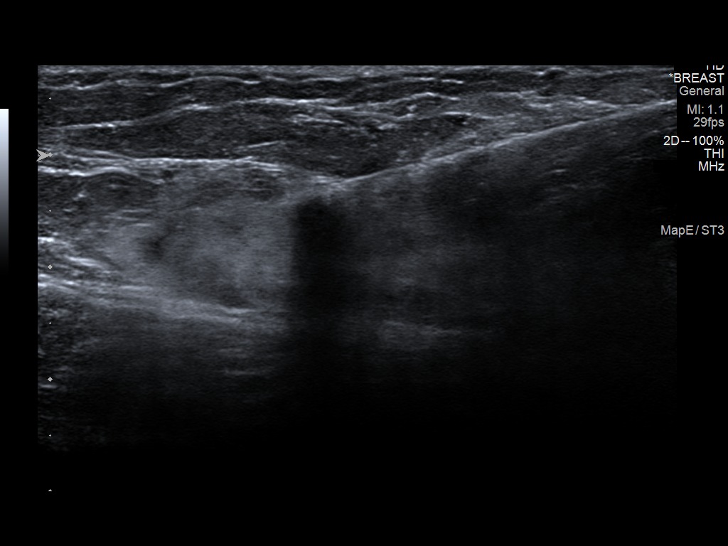
[im 16/19]
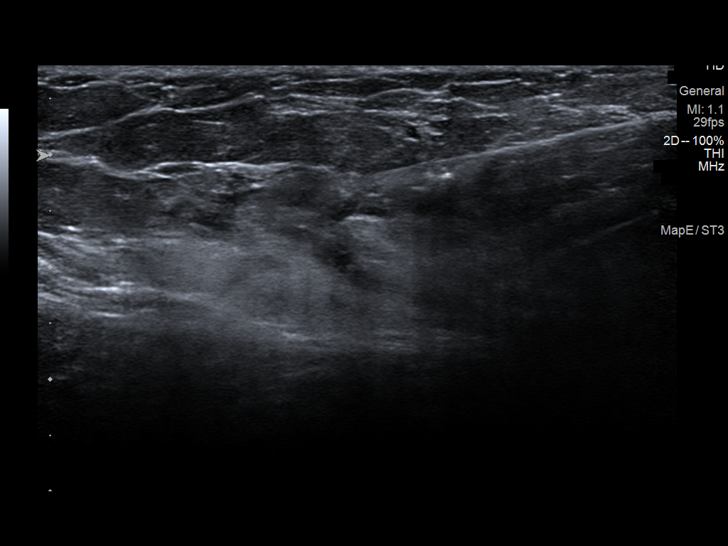
[im 17/19]
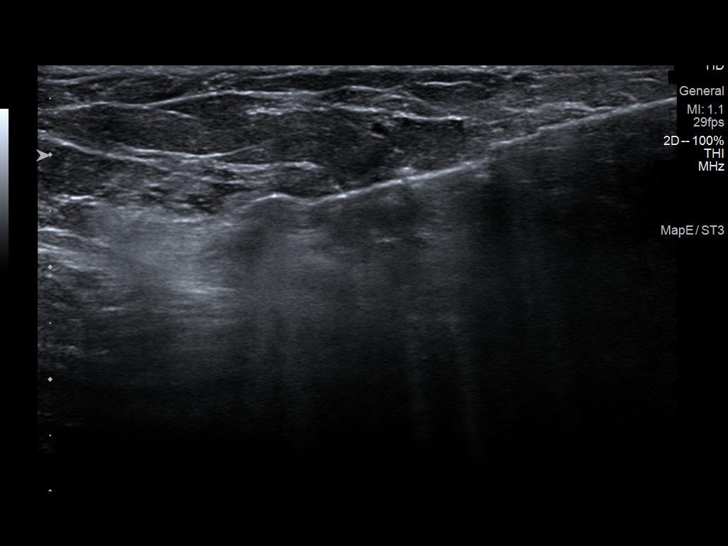
[im 19/19]
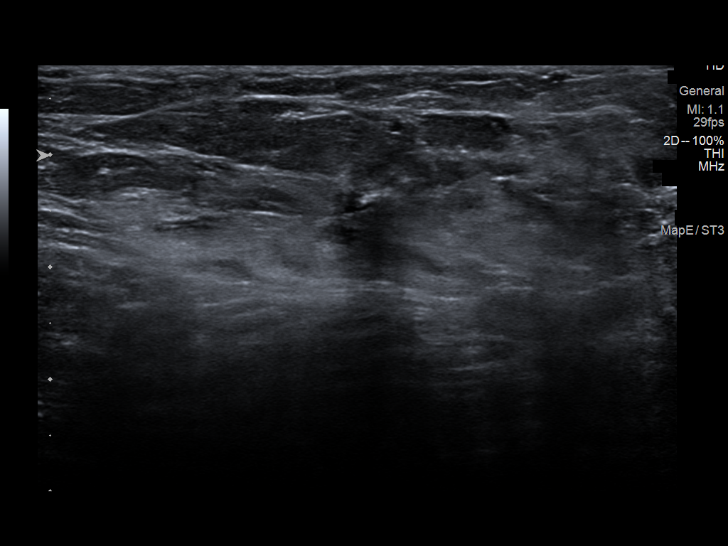

[12 of 19 positions shown; findings below may reference images not displayed]



Lesion quadrant: Upper outer quadrant

Using sterile technique and 1% Lidocaine as local anesthetic, under
direct ultrasound visualization, a 14 gauge NOMASIBULELE device was
used to perform biopsy of a mass in right breast at 11 o'clock using
an inferior approach. At the conclusion of the procedure a coil
shaped tissue marker clip was deployed into the biopsy cavity.

--------------------------------------------------

Lesion quadrant: Upper inner quadrant

Using sterile technique and 1% Lidocaine as local anesthetic, under
direct ultrasound visualization, a 14 gauge NOMASIBULELE device was
used to perform biopsy of a mass in the right breast at 1 o'clock
using an inferolateral approach. At the conclusion of the procedure
a heart shaped tissue marker clip was deployed into the biopsy
cavity.

Follow up 2 view mammogram was performed and dictated separately.
IMPRESSION: 1. Ultrasound guided biopsy of a right breast mass at 11 o'clock. No
apparent complications.

2. Ultrasound-guided biopsy of a right breast mass at 1 o'clock. No
apparent complications.

ADDENDUM:
Pathology revealed GRADE II INVASIVE DUCTAL CARCINOMA of the RIGHT
breast, 11 o'clock mass. This was found to be concordant by Dr.
NOMASIBULELE.

Pathology revealed FIBROCYSTIC CHANGE of the Right breast, 1 o'clock
mass. This was found to be concordant by Dr. NOMASIBULELE.

Pathology results were discussed with the patient and her daughter
by telephone. The patient reported doing well after the biopsies
with tenderness at the sites. Post biopsy instructions and care were
reviewed and questions were answered. The patient was encouraged to
call The [REDACTED] for any additional
concerns.

Surgical consultation has been arranged with Dr. NOMASIBULELE at
[REDACTED] on [DATE].

Pathology results reported by NOMASIBULELE RN on [DATE].



Lesion quadrant: Upper outer quadrant

Using sterile technique and 1% Lidocaine as local anesthetic, under
direct ultrasound visualization, a 14 gauge NOMASIBULELE device was
used to perform biopsy of a mass in right breast at 11 o'clock using
an inferior approach. At the conclusion of the procedure a coil
shaped tissue marker clip was deployed into the biopsy cavity.

--------------------------------------------------

Lesion quadrant: Upper inner quadrant

Using sterile technique and 1% Lidocaine as local anesthetic, under
direct ultrasound visualization, a 14 gauge NOMASIBULELE device was
used to perform biopsy of a mass in the right breast at 1 o'clock
using an inferolateral approach. At the conclusion of the procedure
a heart shaped tissue marker clip was deployed into the biopsy
cavity.

Follow up 2 view mammogram was performed and dictated separately.
IMPRESSION: 1. Ultrasound guided biopsy of a right breast mass at 11 o'clock. No
apparent complications.

2. Ultrasound-guided biopsy of a right breast mass at 1 o'clock. No
apparent complications.

## 2020-02-06 IMAGING — MG MM BREAST LOCALIZATION CLIP
4 series · 4 of 12 positions shown · non-contrast
Comparison: Previous exam(s).

CLINICAL DATA: Post biopsy mammogram of the right breast for clip
placement.

EXAM:
DIAGNOSTIC RIGHT MAMMOGRAM POST ULTRASOUND BIOPSY

[R CC synth-2D]
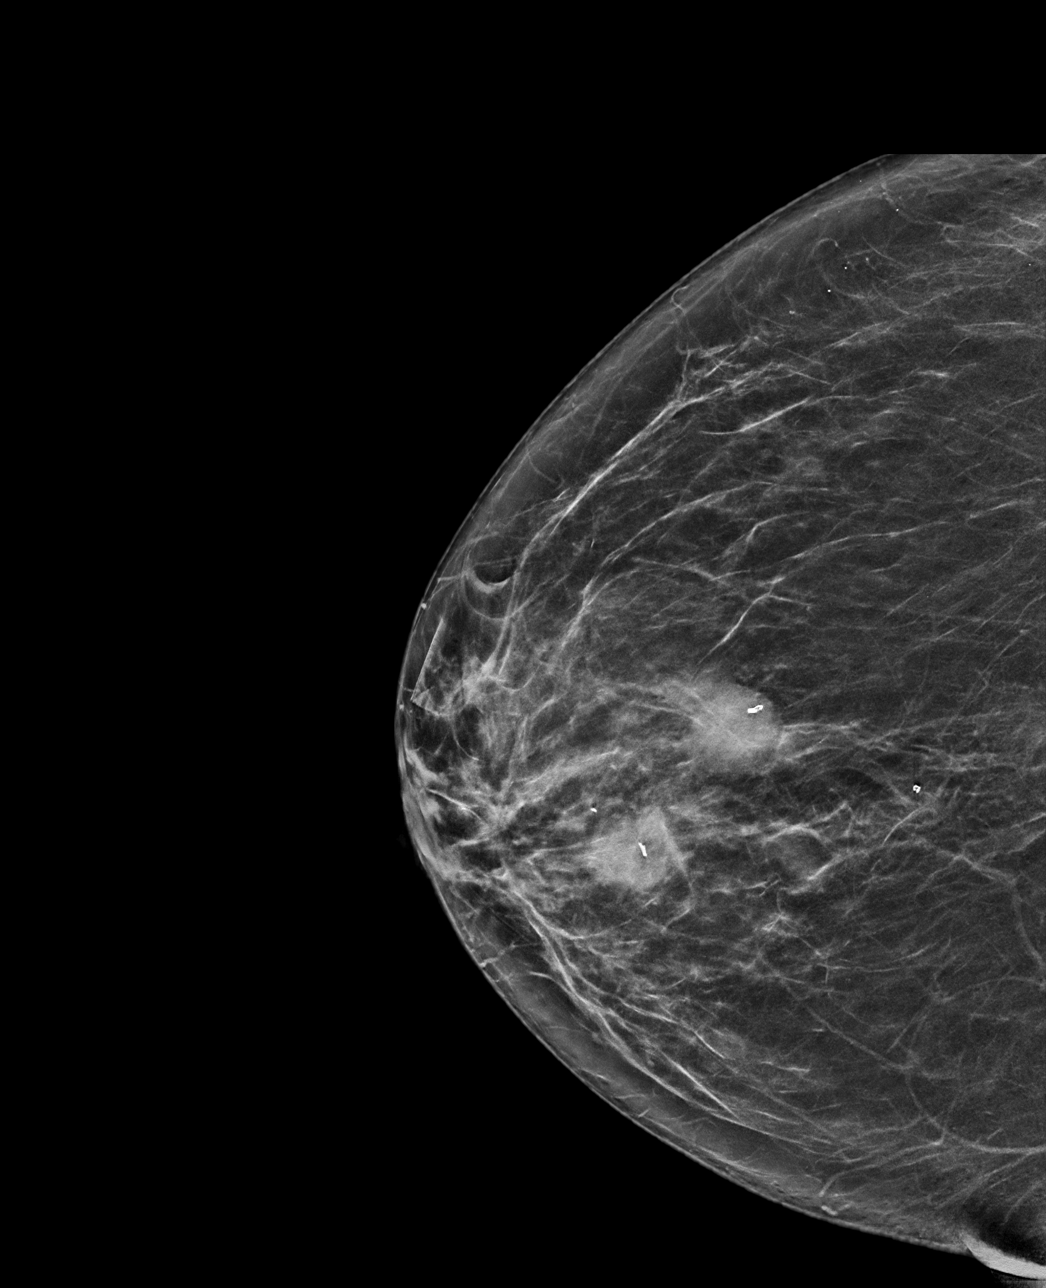

[R ML synth-2D]
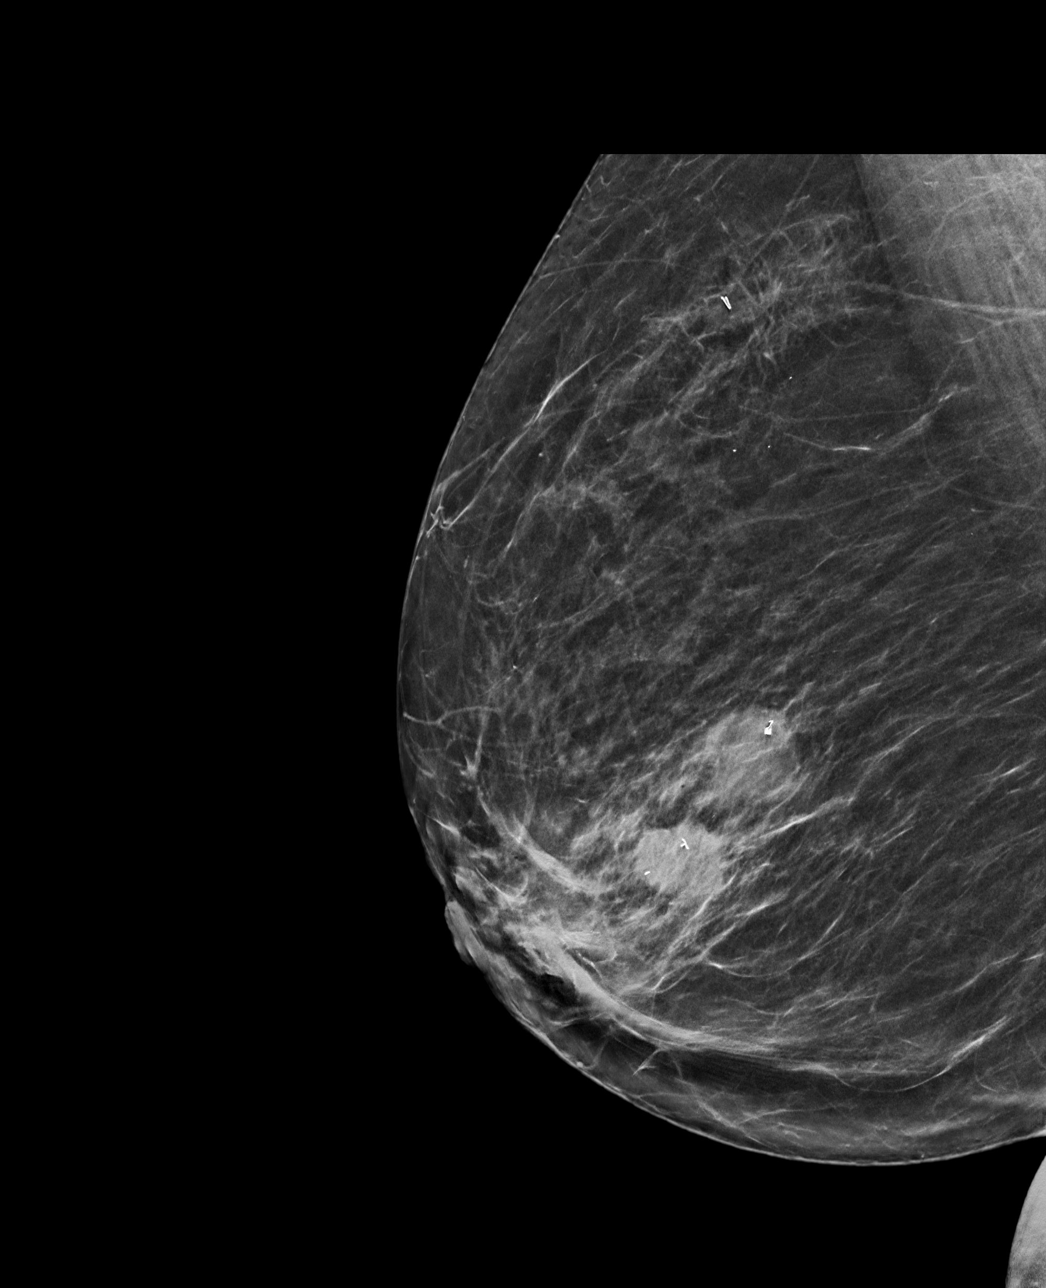

[R CC tomo · tomo slice 37/72.0]
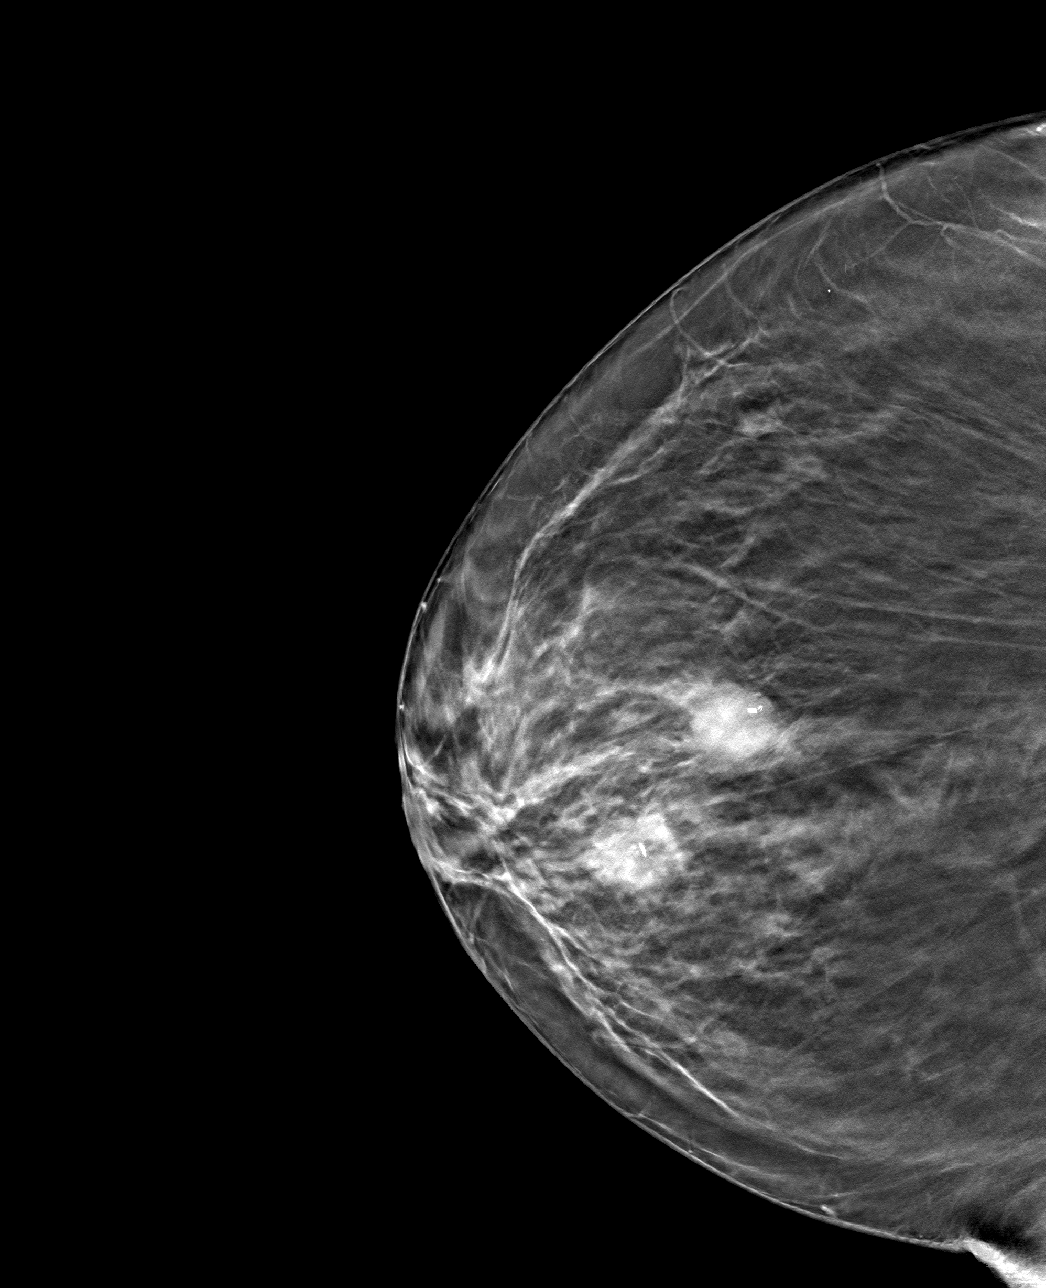

[R ML tomo · tomo slice 34/67.0]
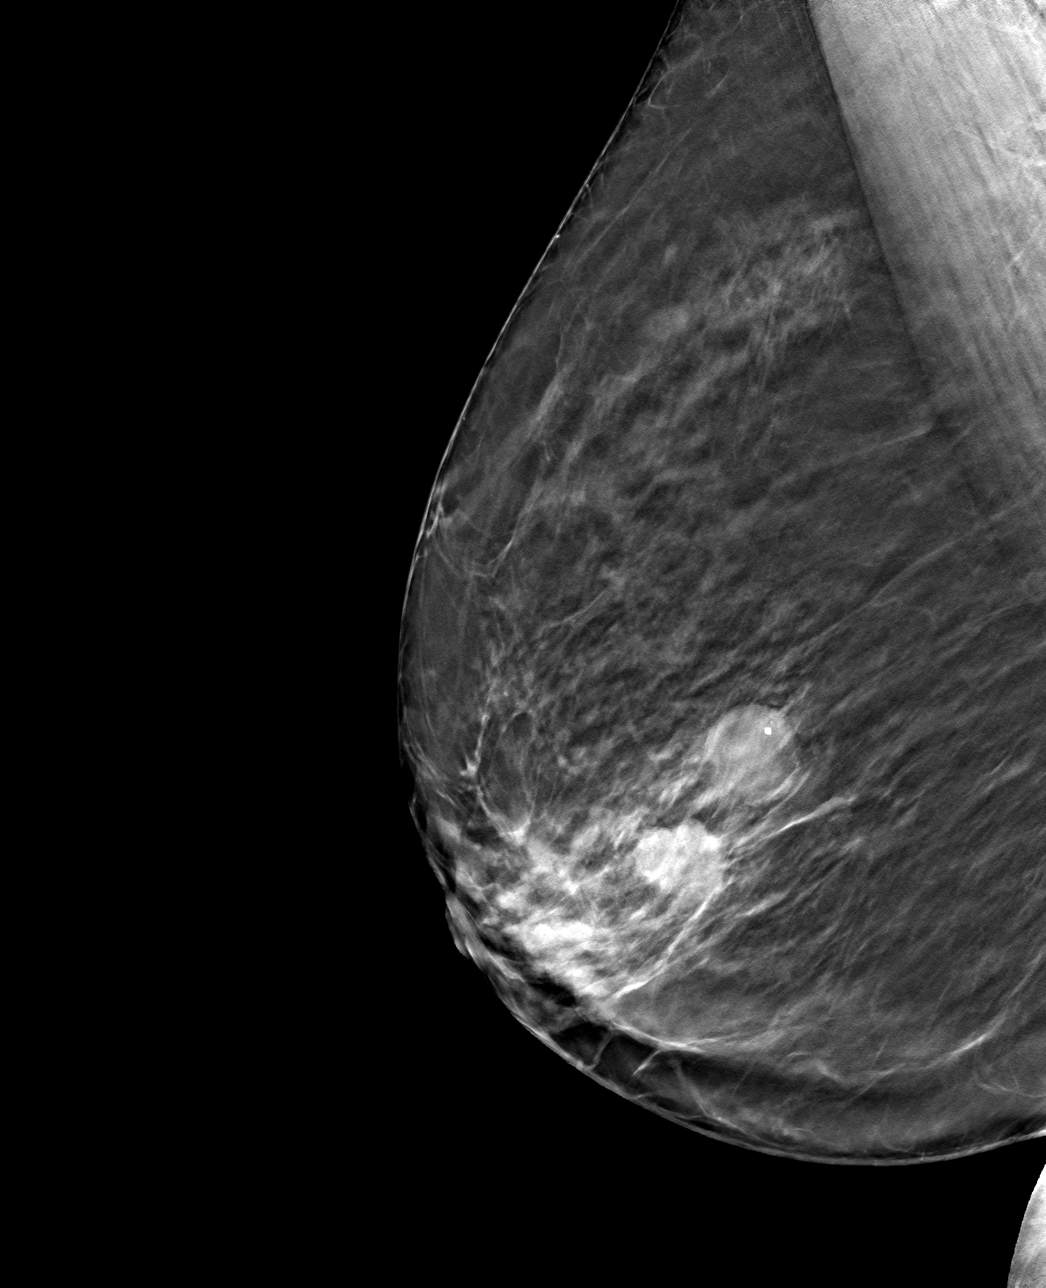

[4 of 12 positions shown; findings below may reference images not displayed]

FINDINGS: Mammographic images were obtained following ultrasound guided biopsy
of 2 masses in the right breast. The biopsy marking clips in
expected position at the sites of biopsy.
IMPRESSION: 1. Appropriate positioning of the coil shaped biopsy marking clip at
the site of biopsy in the upper-outer right breast (11 o'clock).

2. Appropriate positioning of the heart shaped biopsy marking clip
in the upper inner right breast (1 o'clock).

Final Assessment: Post Procedure Mammograms for Marker Placement

## 2020-02-13 ENCOUNTER — Other Ambulatory Visit: Payer: Self-pay | Admitting: *Deleted

## 2020-02-13 ENCOUNTER — Ambulatory Visit: Payer: Self-pay | Admitting: Surgery

## 2020-02-13 ENCOUNTER — Encounter: Payer: Self-pay | Admitting: *Deleted

## 2020-02-13 DIAGNOSIS — C50911 Malignant neoplasm of unspecified site of right female breast: Secondary | ICD-10-CM

## 2020-02-13 DIAGNOSIS — Z170 Estrogen receptor positive status [ER+]: Secondary | ICD-10-CM

## 2020-02-13 NOTE — H&P (Signed)
[name] Appointment: 02/13/2020 9:20 AM Location: [facility] Patient #: [id number] DOB: 1956/03/10 Single / Language: [name] [name] / Race: Black or African American Female  History of Present Illness Marcello Moores A. Waunetta Riggle MD; 02/13/2020 12:15 PM) Patient words: Patient seen at request of the [facility] [place] for multifocal right breast cancer. She has not had a recent mammogram but presented for mammography last month. 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 His many years since her last mammogram. She has no complaints. There were 2 areas in the right breast at 11 and 12:00. She also lymph node biopsy that was positive was noted to have multiple large lymph nodes by ultrasound. Left breast was normal.       ADDITIONAL INFORMATION: 1. FLUORESCENCE IN-SITU HYBRIDIZATION Results: GROUP 5: HER2 **NEGATIVE** Equivocal form of amplification of the HER2 gene was detected in the IHC 2+ tissue sample received from this individual. HER2 FISH was performed by a technologist and cell imaging and analysis on the [employer]. RATIO OF HER2/CEN17 SIGNALS 1.29 AVERAGE HER2 COPY NUMBER PER CELL 2.70 The ratio of HER2/CEN 17 is within the range < 2.0 of HER2/CEN 17 and a copy number of HER2 signals per cell is <4.0. Arch Pathol Lab Med 1:1,2018 Thressa Sheller MD Pathologist, Electronic Signature ( Signed 02/12/2020) 1. PROGNOSTIC INDICATORS Results: IMMUNOHISTOCHEMICAL AND MORPHOMETRIC ANALYSIS PERFORMED MANUALLY The tumor cells are EQUIVOCAL for Her2 (2+). Her2 by FISH will be performed and results reported separately. Estrogen Receptor: 100%, POSITIVE, STRONG STAINING INTENSITY Progesterone Receptor: 0%, NEGATIVE Proliferation Marker Ki67: 15% COMMENT: The negative hormone receptor study(ies) in this case has no internal positive control. REFERENCE RANGE  ESTROGEN RECEPTOR NEGATIVE 0% 1 of 3 FINAL for [name] [name], [name] [name] ([id number]) ADDITIONAL INFORMATION:(continued) POSITIVE =>1% REFERENCE RANGE PROGESTERONE RECEPTOR NEGATIVE 0% POSITIVE =>1% All controls stained appropriately Thressa Sheller MD Pathologist, Electronic Signature ( Signed 02/08/2020) FINAL DIAGNOSIS Diagnosis 1. 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 2. Breast, right, needle core biopsy, 1 o'clock mass - FIBROCYSTIC CHANGE. - NO MALIGNANCY IDENTIFIED. Microscopic Comment 1. The carcinoma appears grade 2 and measures 11 mm in greatest linear extent. Prognostic makers will be ordered. The case was called to [facility] [employer] on 02/07/2020. [name] Males MD Pathologist, Electronic Signature (Case signed 02/07/2020)     Diagnosis 1. Breast, right, needle core biopsy, 12 o'clock - INVASIVE DUCTAL CARCINOMA - SEE COMMENT 2. Lymph node, needle/core biopsy, axilla - METASTATIC CARCINOMA INVOLVING A LYMPH NODE - SEE COMMENT.  The patient is a 64 year old female.   Past Surgical History (Chanel Teressa Senter, [place]; 02/13/2020 9:22 AM) No pertinent past surgical history  Diagnostic Studies History (Chanel Teressa Senter, [place]; 02/13/2020 9:22 AM) Colonoscopy never Mammogram within last year Pap Smear 1-5 years ago  Allergies (Chanel Teressa Senter, CMA; 02/13/2020 9:22 AM) Sulfa Antibiotics Allergies Reconciled  Medication History (Chanel Teressa Senter, CMA; 02/13/2020 9:23 AM) Carvedilol (6.25MG Tablet, Oral) Active. Losartan Potassium (100MG Tablet, Oral) Active. metFORMIN HCl (1000MG Tablet, Oral) Active. CeleBREX (200MG Capsule, Oral) Active. Medications Reconciled  Social History Antonietta Jewel, CMA; 02/13/2020 9:22 AM) Alcohol use Occasional alcohol use. Caffeine use Coffee, Tea. No drug use Tobacco use Former smoker.  Family History ([place], [place]; 02/13/2020 9:22 AM) Diabetes Mellitus Mother. Heart  Disease Mother.  Pregnancy / Birth History Antonietta Jewel, [place]; 02/13/2020 9:22 AM) Age  of menopause 51-55 Gravida 1 Irregular periods Length (months) of breastfeeding 3-6 Maternal age 75-30 Para 1  Other Problems (Chanel Teressa Senter, [place]; 02/13/2020 9:22 AM) Arthritis Back Pain Bladder Problems Diabetes Mellitus Gastroesophageal Reflux Disease Heart murmur High blood pressure     Review of Systems (Chanel Nolan CMA; 02/13/2020 9:22 AM) General Not Present- Appetite Loss, Chills, Fatigue, Fever, Night Sweats, Weight Gain and Weight Loss. Skin Not Present- Change in Wart/Mole, Dryness, Hives, Jaundice, New Lesions, Non-Healing Wounds, Rash and Ulcer. HEENT Present- Ringing in the Ears, Visual Disturbances and Wears glasses/contact lenses. Not Present- Earache, Hearing Loss, Hoarseness, Nose Bleed, Oral Ulcers, Seasonal Allergies, Sinus Pain, Sore Throat and Yellow Eyes. Respiratory Not Present- Bloody sputum, Chronic Cough, Difficulty Breathing, Snoring and Wheezing. Breast Present- Breast Mass. Not Present- Breast Pain, Nipple Discharge and Skin Changes. Cardiovascular Not Present- Chest Pain, Difficulty Breathing Lying Down, Leg Cramps, Palpitations, Rapid Heart Rate, Shortness of Breath and Swelling of Extremities. Gastrointestinal Present- Indigestion. Not Present- Abdominal Pain, Bloating, Bloody Stool, Change in Bowel Habits, Chronic diarrhea, Constipation, Difficulty Swallowing, Excessive gas, Gets full quickly at meals, Hemorrhoids, Nausea, Rectal Pain and Vomiting. Female Genitourinary Present- Painful Urination and Pelvic Pain. Not Present- Frequency, Nocturia and Urgency. Musculoskeletal Present- Back Pain, Joint Pain, Joint Stiffness and Swelling of Extremities. Not Present- Muscle Pain and Muscle Weakness. Neurological Present- Headaches. Not Present- Decreased Memory, Fainting, Numbness, Seizures, Tingling, Tremor, Trouble walking and Weakness. Psychiatric Not  Present- Anxiety, Bipolar, Change in Sleep Pattern, Depression, Fearful and Frequent crying. Endocrine Present- Hot flashes. Not Present- Cold Intolerance, Excessive Hunger, Hair Changes, Heat Intolerance and New Diabetes. Hematology Not Present- Blood Thinners, Easy Bruising, Excessive bleeding, Gland problems, HIV and Persistent Infections.  Vitals (Chanel Nolan CMA; 02/13/2020 9:23 AM) 02/13/2020 9:23 AM Weight: 218.25 lb Height: 66in Body Surface Area: 2.08 m Body Mass Index: 35.23 kg/m  Temp.: 97.61F  Pulse: 68 (Regular)  BP: 126/74(Sitting, Left Arm, Standard)        Physical Exam (Brittanni Cariker A. Falisa Lamora MD; 02/13/2020 12:16 PM)  General Mental Status-Alert. General Appearance-Consistent with stated age. Hydration-Well hydrated. Voice-Normal.  Head and Neck Head-normocephalic, atraumatic with no lesions or palpable masses. Trachea-midline. Thyroid Gland Characteristics - normal size and consistency.  Chest and Lung Exam Chest and lung exam reveals -quiet, even and easy respiratory effort with no use of accessory muscles and on auscultation, normal breath sounds, no adventitious sounds and normal vocal resonance. Inspection Chest Wall - Normal. Back - normal.  Breast Note: Right breast has postbiopsy changes at the 11 and 12 o'clock position. Left breast is normal.  Cardiovascular Cardiovascular examination reveals -normal heart sounds, regular rate and rhythm with no murmurs and normal pedal pulses bilaterally.  Neurologic Neurologic evaluation reveals -alert and oriented x 3 with no impairment of recent or remote memory. Mental Status-Normal.  Musculoskeletal Normal Exam - Left-Upper Extremity Strength Normal and Lower Extremity Strength Normal. Normal Exam - Right-Upper Extremity Strength Normal and Lower Extremity Strength Normal.  Lymphatic Axillary -Note:Bulky right axillary adenopathy. Nonfixed No left axillary  adenopathy.     Assessment & Plan (Ovide Dusek A. Jaylon Boylen MD; 02/13/2020 12:19 PM)  BREAST CANCER METASTASIZED TO AXILLARY LYMPH NODE, RIGHT (C50.911) Impression: Locally advanced multifocal right breast cancer. Refer to medical oncology Referring radiation oncology Attending magnetic resonance imaging to get a better idea of size and given multifocal nature we'll plan surgical management. Risk of lumpectomy include bleeding, infection, seroma, more surgery, use of seed/wire, wound care, cosmetic deformity and the need for other treatments, death ,  blood clots, death. Pt agrees to proceed. Pt requires port placement for chemotherapy. Risk include bleeding, infection, pneumothorax, hemothorax, mediastinal injury, nerve injury , blood vessel injury, stroke, blood clots, death, migration. embolization and need for additional procedures. Pt agrees to proceed. Risk of lymph node dissection including bleeding, infection, major vascular injury, major nerve injury, lymphedema, decreased function, pain, stiffness, need for additional treatments She should be a good right breast lumpectomy candidate given her breast size even with multifocal disease which she is in agreement to. She will need a right axillary lymph node dissection due to the bulky adenopathy she has as well. She may be chemotherapy therefore will have her see medical oncology and talk to her today about port placement as well. She and her daughter understand the above plan. She would like to conserve her breast if possible.  total time 45 minutes  Current Plans You are being scheduled for surgery- Our schedulers will call you.  You should hear from our office's scheduling department within 5 working days about the location, date, and time of surgery. We try to make accommodations for patient's preferences in scheduling surgery, but sometimes the OR schedule or the surgeon's schedule prevents [place] from making those accommodations.  If you have  not heard from our office [phone]) in 5 working days, call the office and ask for your surgeon's nurse.  If you have other questions about your diagnosis, plan, or surgery, call the office and ask for your surgeon's nurse.  Pt Education - [facility] Breast Cancer Information Given - [facility] "Breast Journey" Package Pt Education - [facility] breast cancer surgery: discussed with patient and provided information. We discussed the staging and pathophysiology of breast cancer. We discussed all of the different options for treatment for breast cancer including surgery, chemotherapy, radiation therapy, Herceptin, and antiestrogen therapy. We discussed a sentinel lymph node biopsy as she does not appear to having lymph node involvement right now. We discussed the performance of that with injection of radioactive tracer and blue dye. We discussed that she would have an incision underneath her axillary hairline. We discussed that there is a bout a 10-20% chance of having a positive node with a sentinel lymph node biopsy and we will await the permanent pathology to make any other first further decisions in terms of her treatment. One of these options might be to return to the operating room to perform an axillary lymph node dissection. We discussed about a 1-2% risk lifetime of chronic shoulder pain as well as lymphedema associated with a sentinel lymph node biopsy. We discussed the options for treatment of the breast cancer which included lumpectomy versus a mastectomy. We discussed the performance of the lumpectomy with a wire placement. We discussed a 10-20% chance of a positive margin requiring reexcision in the operating room. We also discussed that she may need radiation therapy or antiestrogen therapy or both if she undergoes lumpectomy. We discussed the mastectomy and the postoperative care for that as well. We discussed that there is no difference in her survival whether she undergoes lumpectomy with radiation  therapy or antiestrogen therapy versus a mastectomy. There is a slight difference in the local recurrence rate being 3-5% with lumpectomy and about 1% with a mastectomy. We discussed the risks of operation including bleeding, infection, possible reoperation. She understands her further therapy will be based on what her stages at the time of her operation.  Pt Education - ABC (After Breast Cancer) Class Info: discussed with patient and provided information.

## 2020-02-14 ENCOUNTER — Other Ambulatory Visit: Payer: Self-pay | Admitting: *Deleted

## 2020-02-14 DIAGNOSIS — C50411 Malignant neoplasm of upper-outer quadrant of right female breast: Secondary | ICD-10-CM

## 2020-02-15 ENCOUNTER — Ambulatory Visit: Payer: No Typology Code available for payment source | Attending: Surgery | Admitting: Physical Therapy

## 2020-02-15 ENCOUNTER — Inpatient Hospital Stay: Payer: Self-pay | Attending: Oncology | Admitting: Oncology

## 2020-02-15 ENCOUNTER — Encounter: Payer: Self-pay | Admitting: Physical Therapy

## 2020-02-15 ENCOUNTER — Telehealth: Payer: Self-pay | Admitting: *Deleted

## 2020-02-15 ENCOUNTER — Other Ambulatory Visit: Payer: Self-pay

## 2020-02-15 ENCOUNTER — Inpatient Hospital Stay: Payer: No Typology Code available for payment source

## 2020-02-15 VITALS — BP 171/83 | HR 61 | Temp 97.4°F | Resp 20 | Ht 66.0 in | Wt 218.3 lb

## 2020-02-15 DIAGNOSIS — C50911 Malignant neoplasm of unspecified site of right female breast: Secondary | ICD-10-CM | POA: Insufficient documentation

## 2020-02-15 DIAGNOSIS — C50411 Malignant neoplasm of upper-outer quadrant of right female breast: Secondary | ICD-10-CM

## 2020-02-15 DIAGNOSIS — Z170 Estrogen receptor positive status [ER+]: Secondary | ICD-10-CM

## 2020-02-15 DIAGNOSIS — Z79899 Other long term (current) drug therapy: Secondary | ICD-10-CM | POA: Insufficient documentation

## 2020-02-15 DIAGNOSIS — R293 Abnormal posture: Secondary | ICD-10-CM | POA: Insufficient documentation

## 2020-02-15 DIAGNOSIS — Z5111 Encounter for antineoplastic chemotherapy: Secondary | ICD-10-CM | POA: Insufficient documentation

## 2020-02-15 LAB — CBC WITH DIFFERENTIAL (CANCER CENTER ONLY)
Abs Immature Granulocytes: 0.02 10*3/uL (ref 0.00–0.07)
Basophils Absolute: 0.1 10*3/uL (ref 0.0–0.1)
Basophils Relative: 1 %
Eosinophils Absolute: 0.1 10*3/uL (ref 0.0–0.5)
Eosinophils Relative: 2 %
HCT: 38.2 % (ref 36.0–46.0)
Hemoglobin: 12.2 g/dL (ref 12.0–15.0)
Immature Granulocytes: 0 %
Lymphocytes Relative: 41 %
Lymphs Abs: 2.6 10*3/uL (ref 0.7–4.0)
MCH: 28.6 pg (ref 26.0–34.0)
MCHC: 31.9 g/dL (ref 30.0–36.0)
MCV: 89.5 fL (ref 80.0–100.0)
Monocytes Absolute: 0.4 10*3/uL (ref 0.1–1.0)
Monocytes Relative: 6 %
Neutro Abs: 3.2 10*3/uL (ref 1.7–7.7)
Neutrophils Relative %: 50 %
Platelet Count: 252 10*3/uL (ref 150–400)
RBC: 4.27 MIL/uL (ref 3.87–5.11)
RDW: 13.8 % (ref 11.5–15.5)
WBC Count: 6.3 10*3/uL (ref 4.0–10.5)
nRBC: 0 % (ref 0.0–0.2)

## 2020-02-15 LAB — CMP (CANCER CENTER ONLY)
ALT: 38 U/L (ref 0–44)
AST: 23 U/L (ref 15–41)
Albumin: 4 g/dL (ref 3.5–5.0)
Alkaline Phosphatase: 81 U/L (ref 38–126)
Anion gap: 5 (ref 5–15)
BUN: 9 mg/dL (ref 8–23)
CO2: 30 mmol/L (ref 22–32)
Calcium: 9.5 mg/dL (ref 8.9–10.3)
Chloride: 105 mmol/L (ref 98–111)
Creatinine: 0.75 mg/dL (ref 0.44–1.00)
GFR, Estimated: >60 mL/min (ref >60)
Glucose, Bld: 132 mg/dL — ABNORMAL HIGH (ref 70–99)
Potassium: 4 mmol/L (ref 3.5–5.1)
Sodium: 140 mmol/L (ref 135–145)
Total Bilirubin: 0.5 mg/dL (ref 0.3–1.2)
Total Protein: 7.1 g/dL (ref 6.5–8.1)

## 2020-02-15 MED ORDER — PROCHLORPERAZINE MALEATE 10 MG PO TABS: 10.0000 mg | ORAL_TABLET | Freq: Three times a day (TID) | ORAL | 1 refills | Status: DC

## 2020-02-15 MED ORDER — DEXAMETHASONE 4 MG PO TABS: 8.0000 mg | ORAL_TABLET | Freq: Two times a day (BID) | ORAL | 1 refills | Status: DC

## 2020-02-15 MED ORDER — LIDOCAINE-PRILOCAINE 2.5-2.5 % EX CREA: TOPICAL_CREAM | CUTANEOUS | 3 refills | Status: DC

## 2020-02-15 MED ORDER — LORAZEPAM 0.5 MG PO TABS: 0.5000 mg | ORAL_TABLET | Freq: Every evening | ORAL | 0 refills | Status: DC | PRN

## 2020-02-15 NOTE — Progress Notes (Signed)
START OFF PATHWAY REGIMEN - Breast   OFF00004:Docetaxel + Cyclophosphamide (TC):   A cycle is every 21 days:     Docetaxel      Cyclophosphamide   **Always confirm dose/schedule in your pharmacy ordering system**  Patient Characteristics: Preoperative or Nonsurgical Candidate (Clinical Staging), Neoadjuvant Therapy followed by Surgery, Invasive Disease, Chemotherapy, HER2 Negative/Unknown/Equivocal, ER Positive Therapeutic Status: Preoperative or Nonsurgical Candidate (Clinical Staging) AJCC M Category: cM0 AJCC Grade: G3 Breast Surgical Plan: Neoadjuvant Therapy followed by Surgery ER Status: Positive (+) AJCC 8 Stage Grouping: IIIA HER2 Status: Negative (-) AJCC T Category: cT2 AJCC N Category: cN1 PR Status: Negative (-) Intent of Therapy: Curative Intent, Discussed with Patient

## 2020-02-15 NOTE — Telephone Encounter (Signed)
°  Oncology Nurse Navigator Documentation  Navigator Location: [facility] (02/15/20 1700)   )Navigator Encounter Type: Appt/Treatment Plan Review (02/15/20 1700)                                                    Time Spent with Patient: 15 (02/15/20 1700)

## 2020-02-15 NOTE — Progress Notes (Signed)
[place]  [phone]) [phone] [phone]     ID: Leonarda Leis DOB: [birth year]  MR#: [id number]  DXA#:[id number]  Patient Care Team: Patient, No Pcp Per as PCP - General (General Practice) Mauro Kaufmann, RN as Oncology Nurse Navigator Rockwell Germany, RN as Oncology Nurse Navigator Drexel Ivey, Virgie Dad, MD as Consulting Physician (Oncology) Erroll Luna, MD as Consulting Physician (General Surgery) Eppie Gibson, MD as Attending Physician (Radiation Oncology) Chauncey Cruel, MD OTHER MD:  CHIEF COMPLAINT: Estrogen receptor positive breast cancer  CURRENT TREATMENT: Neoadjuvant chemotherapy   HISTORY OF CURRENT ILLNESS: Ennis Delpozo herself palpated a mass in the right axilla "a long time ago", more recently with complaints of associated pain. She brought it to medical attention and underwent bilateral diagnostic mammography with tomography and bilateral breast ultrasonography at [facility] on 01/31/2020 showing: breast density category B; three right breast masses-- 1.9 cm at 12 o'clock, 2.3 cm at 11 o'clock, 0.4 cm at 1 o'clock; two adjacent enlarged thickened lymph nodes measuring up to 2.1 cm; no evidence of left breast malignancy.  Accordingly on 02/03/2020 she proceeded to biopsy of the right breast area at 12 o'clock and right axilla. The pathology from this procedure ([id number]) showed: invasive ductal carcinoma, grade 3. Prognostic indicators significant for: estrogen receptor, 100% positive with strong staining intensity and progesterone receptor, 0% negative. Proliferation marker Ki67 at 60%. HER2 equivocal by immunohistochemistry (2+), but negative by fluorescent in situ hybridization with a signals ratio 0.81 and number per cell 2.7.  The biopsied lymph node was found to show metastatic carcinoma. The morphology was considered different from the biopsied mass, and a prognostic panel was performed. Estrogen receptor, 100% positive with  strong staining intensity and progesterone receptor, 0% negative. Proliferation marker Ki67 at 70%. HER2 equivcoal by immunohistochemistry (2+), but negative) by fluorescent in situ hybridization with a signals ratio 1.16 and number per cell 3.9.  She underwent additional right breast biopsies on 02/06/2020. Pathology [id number]) from the mass at 11 o'clock showed: invasive ductal carcinoma, grade 2. Prognostic indicators significant for: estrogen receptor, 100% positive with strong staining intensity and progesterone receptor, 0% negative. Proliferation marker Ki67 at 15%. HER2 equivocal by immunohistochemistry (2+), but negative by fluorescent in situ hybridization with a signals ratio 1.29 and number per cell 2.7.  The biopsied mass at 1 o'clock showed only fibrocystic change.  The patient's subsequent history is as detailed below.   INTERVAL HISTORY: Tujuana was evaluated in the breast cancer clinic on 02/15/2020.  Her daughter [name] participated by speaker phone.  The patient's case was also presented at the multidisciplinary breast cancer conference on the same day. At that time a preliminary plan was proposed: Neoadjuvant chemotherapy, followed by breast conserving surgery, adjuvant radiation, and antiestrogens.   REVIEW OF SYSTEMS:  The patient denies unusual headaches, visual changes, nausea, vomiting, stiff neck, dizziness, or gait imbalance.  She uses a cane and has very bad knees she says.  At home she mostly takes care of the 2 grandchildren sitting down.  She does not do much housework, or cooking.  There has been no cough, phlegm production, or pleurisy, no chest pain or pressure, and no change in bowel or bladder habits. The patient denies fever, rash, bleeding, unexplained fatigue or unexplained weight loss.  She received both doses of a vaccine but she does not recall the name.  A detailed review of systems was otherwise noncontributory   PAST MEDICAL HISTORY: Past Medical History:   Diagnosis Date  .  Arthritis   . Diabetes mellitus without complication ([place])   . Hypertension     PAST SURGICAL HISTORY: No past surgical history on file.  FAMILY HISTORY: Family History  Problem Relation Age of Onset  . Asthma Mother   . Arthritis Sister   . Asthma Brother   . Arthritis Brother   [name] died in his 5s from causes unknown to the patient.  [name] died from heart disease at age 73.  The patient had 4 brothers, 1 sister, with no history of cancer in the family to her knowledge   GYNECOLOGIC HISTORY:  No LMP recorded. Patient is postmenopausal. Menarche: 64 years old Age at first live birth: 64 years old [place] P 1 LMP 54s Contraceptive HRT no  Hysterectomy? no BSO? no   SOCIAL HISTORY: (updated 02/2020)  Eiko is originally from [place].  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  She describes herself as single.  At home she lives with her daughter [name] who works for [employer], her husband [name] who is a English as a second language teacher, and their children who are 58 months old and 50 years old.  The patient is in [place]: Not in place.  The patient tells me she would intend to name her daughter [name] as her healthcare power of attorney   HEALTH MAINTENANCE: Social History   Tobacco Use  . Smoking status: Never Smoker  . Smokeless tobacco: Never Used  Vaping Use  . Vaping Use: Never used  Substance Use Topics  . Alcohol use: Yes    Comment: occasionally  . Drug use: Never     Colonoscopy: never done  PAP: 2017 (performed in [place])  Bone density:    Allergies  Allergen Reactions  . Sulfa Antibiotics Itching    Current Outpatient Medications  Medication Sig Dispense Refill  . carvedilol (COREG) 6.25 MG tablet Take 6.25 mg by mouth 2 (two) times daily with a meal.    . celecoxib (CELEBREX) 200 MG capsule Take 200 mg by mouth daily.     Marland Kitchen losartan (COZAAR) 100 MG tablet Take 100  mg by mouth daily.    . metFORMIN (GLUCOPHAGE) 1000 MG tablet Take 1,000 mg by mouth daily with breakfast.      No current facility-administered medications for this visit.    OBJECTIVE: African-American woman who appears older than stated age  81:   02/15/20 1602  BP: (!) 171/83  Pulse: 61  Resp: 20  Temp: (!) 97.4 F (36.3 C)  SpO2: 100%     Body mass index is 35.23 kg/m.   Wt Readings from Last 3 Encounters:  02/15/20 218 lb 4.8 oz (99 kg)  01/24/20 218 lb 3.2 oz (99 kg)      ECOG FS:2 - Symptomatic, <50% confined to bed  Ocular: Sclerae unicteric, pupils round and equal Ear-nose-throat: Wearing a mask Lymphatic: No cervical or supraclavicular adenopathy Lungs no rales or rhonchi Heart regular rate and rhythm Abd soft, nontender, positive bowel sounds MSK no focal spinal tenderness, no joint edema Neuro: non-focal, well-oriented, appropriate affect Breasts: The mass in the right breast is palpable and movable, difficult to delineate, with no skin or nipple involvement.  The left breast is benign.  I do not palpate any adenopathy in either axilla   LAB RESULTS:  CMP  No results found for: NA, K, CL, CO2, GLUCOSE, BUN, CREATININE, CALCIUM, PROT, ALBUMIN, AST, ALT, ALKPHOS, BILITOT, GFRNONAA, GFRAA  No results found for: TOTALPROTELP, ALBUMINELP, A1GS, A2GS, BETS, BETA2SER, GAMS, MSPIKE, SPEI  Lab Results  Component Value Date   WBC 6.3 02/15/2020   NEUTROABS 3.2 02/15/2020   HGB 12.2 02/15/2020   HCT 38.2 02/15/2020   MCV 89.5 02/15/2020   PLT 252 02/15/2020    No results found for: LABCA2  No components found for: [id number]  No results for input(s): INR in the last 168 hours.  No results found for: LABCA2  No results found for: XYD289  No results found for: TVN504  No results found for: HJS438  No results found for: CA2729  No components found for: HGQUANT  No results found for: CEA1 / No results found for: CEA1   No results found for:  AFPTUMOR  No results found for: CHROMOGRNA  No results found for: KPAFRELGTCHN, LAMBDASER, KAPLAMBRATIO (kappa/lambda light chains)  No results found for: HGBA, HGBA2QUANT, HGBFQUANT, HGBSQUAN (Hemoglobinopathy evaluation)   No results found for: LDH  No results found for: IRON, TIBC, IRONPCTSAT (Iron and TIBC)  No results found for: FERRITIN  Urinalysis No results found for: COLORURINE, APPEARANCEUR, LABSPEC, PHURINE, GLUCOSEU, HGBUR, BILIRUBINUR, KETONESUR, PROTEINUR, UROBILINOGEN, NITRITE, LEUKOCYTESUR   STUDIES: US BREAST LTD UNI LEFT INC AXILLA  Result Date: 01/31/2020 CLINICAL DATA:  Patient presents for palpable mass within the right axilla. EXAM: DIGITAL DIAGNOSTIC BILATERAL MAMMOGRAM WITH CAD AND TOMO ULTRASOUND BILATERAL BREAST COMPARISON:  None. ACR Breast Density Category b: There are scattered areas of fibroglandular density. FINDINGS: Underlying the palpable marker within the right axilla are 2 adjacent enlarged lymph nodes. Within the anterior central right breast there are 2 adjacent irregular masses measuring 1.6 and 2.0 cm. Within the upper inner right breast posterior depth there is a persistent small lobular mass. Questioned asymmetry within the outer right breast appeared to resolve with additional imaging. Questioned asymmetry within the anterior left breast appeared to resolve with additional imaging. Mammographic images were processed with CAD. Targeted ultrasound is performed, showing a 1.9 x 1.7 x 1.7 cm irregular hypoechoic right breast mass 12 o'clock position 1 cm from nipple. There is an adjacent 2.0 x 2.3 x 1.0 cm irregular hypoechoic mass right breast 11 o'clock position 3 cm from nipple. There is a 0.4 x 0.3 x 0.3 cm irregular hypoechoic mass right breast 1 o'clock position 8 cm from nipple. There are 2 adjacent enlarged thickened lymph nodes measuring up to 2.1 cm. No suspicious abnormality within the retroareolar left breast. Dense tissue is visualized.  IMPRESSION: 1. There are 3 suspicious masses within the right breast concerning primary breast malignancy. 2. Two adjacent enlarged thickened right axillary lymph nodes concerning for metastatic adenopathy. RECOMMENDATION: Ultrasound-guided core needle biopsy of the 3 right breast masses and 1 of the thickened right axillary lymph nodes. Patient will be scheduled for 2 separate days. On the first biopsy day, recommend biopsy of the right breast mass 12 o'clock position and 1 of the thickened right axillary lymph nodes. On the second biopsy day, recommend biopsy of the right breast masses 11 o'clock position and 1 o'clock position. I have discussed the findings and recommendations with the patient. If applicable, a reminder letter will be sent to the patient regarding the next appointment. BI-RADS CATEGORY  5: Highly suggestive of malignancy. Electronically Signed   By: Annia Belt M.D.   On: 01/31/2020 13:37   US BREAST LTD UNI RIGHT INC AXILLA  Result Date: 01/31/2020 CLINICAL DATA:  Patient presents for palpable mass within the right axilla. EXAM: DIGITAL DIAGNOSTIC  BILATERAL MAMMOGRAM WITH CAD AND TOMO ULTRASOUND BILATERAL BREAST COMPARISON:  None. ACR Breast Density Category b: There are scattered areas of fibroglandular density. FINDINGS: Underlying the palpable marker within the right axilla are 2 adjacent enlarged lymph nodes. Within the anterior central right breast there are 2 adjacent irregular masses measuring 1.6 and 2.0 cm. Within the upper inner right breast posterior depth there is a persistent small lobular mass. Questioned asymmetry within the outer right breast appeared to resolve with additional imaging. Questioned asymmetry within the anterior left breast appeared to resolve with additional imaging. Mammographic images were processed with CAD. Targeted ultrasound is performed, showing a 1.9 x 1.7 x 1.7 cm irregular hypoechoic right breast mass 12 o'clock position 1 cm from nipple. There is an  adjacent 2.0 x 2.3 x 1.0 cm irregular hypoechoic mass right breast 11 o'clock position 3 cm from nipple. There is a 0.4 x 0.3 x 0.3 cm irregular hypoechoic mass right breast 1 o'clock position 8 cm from nipple. There are 2 adjacent enlarged thickened lymph nodes measuring up to 2.1 cm. No suspicious abnormality within the retroareolar left breast. Dense tissue is visualized. IMPRESSION: 1. There are 3 suspicious masses within the right breast concerning primary breast malignancy. 2. Two adjacent enlarged thickened right axillary lymph nodes concerning for metastatic adenopathy. RECOMMENDATION: Ultrasound-guided core needle biopsy of the 3 right breast masses and 1 of the thickened right axillary lymph nodes. Patient will be scheduled for 2 separate days. On the first biopsy day, recommend biopsy of the right breast mass 12 o'clock position and 1 of the thickened right axillary lymph nodes. On the second biopsy day, recommend biopsy of the right breast masses 11 o'clock position and 1 o'clock position. I have discussed the findings and recommendations with the patient. If applicable, a reminder letter will be sent to the patient regarding the next appointment. BI-RADS CATEGORY  5: Highly suggestive of malignancy. Electronically Signed   By: Lovey Newcomer M.D.   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 This was found to be concordant by Dr. Lillia Mountain. Pathology revealed METASTATIC CARCINOMA INVOLVING A LYMPH NODE of the RIGHT axilla. This was found to be concordant by Dr. Lillia Mountain. Pathology results were discussed with the patient by telephone. The patient reported doing well after the biopsies with tenderness at the sites. Post biopsy instructions and care were reviewed and questions were answered. The patient was encouraged to call [facility] for any additional concerns. 2 additional ultrasound guided biopsies of RIGHT breast are scheduled for 02/06/2020. Surgical consultation has been arranged with Dr. Erroll Luna at [facility] on February 13, 2020. Pathology results reported by Stacie Acres RN on 02/08/2020. Electronically Signed   By: Lillia Mountain M.D.   On: 02/08/2020 08:35   Result Date: 02/08/2020 CLINICAL DATA:  Suspicious mass in the 12 o'clock region of the right breast and suspicious right axillary lymph node. EXAM: ULTRASOUND GUIDED RIGHT BREAST CORE NEEDLE BIOPSIES COMPARISON:  Previous exam(s). PROCEDURE: I met with the patient and we discussed the procedure of ultrasound-guided biopsy, including benefits and alternatives. We discussed the high likelihood of a successful procedure. We discussed the risks of the procedure, including infection, bleeding, tissue injury, clip migration, and inadequate sampling. Informed written consent was given. The usual time-out protocol was performed  immediately prior to the procedure. Lesion quadrant: 12 o'clock Using sterile technique and 1% lidocaine and 1% lidocaine with epinephrine as local anesthetic, under direct ultrasound visualization, a 14 gauge spring-loaded device was used to perform biopsy of a mass in the 12 o'clock region of the right breast using a lateral to medial approach. At the conclusion of the procedure a ribbon shaped tissue marker clip was deployed into the biopsy cavity. Follow up 2 view mammogram was performed and dictated separately. I met with the patient and we discussed the procedure of ultrasound-guided biopsy, including benefits and alternatives. We discussed the high likelihood of a successful procedure. We discussed the risks of the procedure, including infection, bleeding, tissue injury, clip migration, and inadequate sampling. Informed written consent was given. The usual time-out protocol was performed immediately prior  to the procedure. Lesion quadrant: Right axilla Using sterile technique and 1% lidocaine and 1% lidocaine with epinephrine as local anesthetic, under direct ultrasound visualization, a 14 gauge spring-loaded device was used to perform biopsy of a right axillary lymph node using a lateral to medial approach. At the conclusion of the procedure [facility] tissue marker clip was deployed into the biopsy cavity. Follow up 2 view mammogram was performed and dictated separately. IMPRESSION: Ultrasound guided biopsies of a mass in the 12 o'clock region of the right breast and a right axillary lymph node. No apparent complications. Electronically Signed: By: Lillia Mountain M.D. On: 02/03/2020 14:46   MS DIGITAL DIAG TOMO BILAT  Result Date: 01/31/2020 CLINICAL DATA:  Patient presents for palpable mass within the right axilla. EXAM: DIGITAL DIAGNOSTIC BILATERAL MAMMOGRAM WITH CAD AND TOMO ULTRASOUND BILATERAL BREAST COMPARISON:  None. ACR Breast Density Category b: There are scattered areas of fibroglandular density. FINDINGS: Underlying the palpable marker within the right axilla are 2 adjacent enlarged lymph nodes. Within the anterior central right breast there are 2 adjacent irregular masses measuring 1.6 and 2.0 cm. Within the upper inner right breast posterior depth there is a persistent small lobular mass. Questioned asymmetry within the outer right breast appeared to resolve with additional imaging. Questioned asymmetry within the anterior left breast appeared to resolve with additional imaging. Mammographic images were processed with CAD. Targeted ultrasound is performed, showing a 1.9 x 1.7 x 1.7 cm irregular hypoechoic right breast mass 12 o'clock position 1 cm from nipple. There is an adjacent 2.0 x 2.3 x 1.0 cm irregular hypoechoic mass right breast 11 o'clock position 3 cm from nipple. There is a 0.4 x 0.3 x 0.3 cm irregular hypoechoic mass right breast 1 o'clock position 8 cm from nipple. There are 2 adjacent  enlarged thickened lymph nodes measuring up to 2.1 cm. No suspicious abnormality within the retroareolar left breast. Dense tissue is visualized. IMPRESSION: 1. There are 3 suspicious masses within the right breast concerning primary breast malignancy. 2. Two adjacent enlarged thickened right axillary lymph nodes concerning for metastatic adenopathy. RECOMMENDATION: Ultrasound-guided core needle biopsy of the 3 right breast masses and 1 of the thickened right axillary lymph nodes. Patient will be scheduled for 2 separate days. On the first biopsy day, recommend biopsy of the right breast mass 12 o'clock position and 1 of the thickened right axillary lymph nodes. On the second biopsy day, recommend biopsy of the right breast masses 11 o'clock position and 1 o'clock position. I have discussed the findings and recommendations with the patient. If applicable, a reminder letter will be sent to the patient regarding the next appointment. BI-RADS CATEGORY  5: Highly suggestive  of malignancy. Electronically Signed   By: Lovey Newcomer M.D.   On: 01/31/2020 13:37   MM CLIP PLACEMENT RIGHT  Result Date: 02/06/2020 CLINICAL DATA:  Post biopsy mammogram of the right breast for clip placement. EXAM: DIAGNOSTIC RIGHT MAMMOGRAM POST ULTRASOUND BIOPSY COMPARISON:  Previous exam(s). FINDINGS: Mammographic images were obtained following ultrasound guided biopsy of 2 masses in the right breast. The biopsy marking clips in expected position at the sites of biopsy. IMPRESSION: 1. Appropriate positioning of the coil shaped biopsy marking clip at the site of biopsy in the upper-outer right breast (11 o'clock). 2. Appropriate positioning of the heart shaped biopsy marking clip in the upper inner right breast (1 o'clock). Final Assessment: Post Procedure Mammograms for Marker Placement Electronically Signed   By: Ammie Ferrier M.D.   On: 02/06/2020 10:34   MM CLIP PLACEMENT RIGHT  Result Date: 02/03/2020 CLINICAL DATA:  Status post  ultrasound-guided core biopsies of a mass in the 12 o'clock region of the right breast and a right axillary lymph node. EXAM: 3D DIAGNOSTIC right MAMMOGRAM POST ULTRASOUND BIOPSIES COMPARISON:  Previous exam(s). FINDINGS: 3D Mammographic images were obtained following ultrasound guided core biopsies of a mass in the 12 o'clock region of the right breast and a right axillary lymph node. Mammographic images show there is a ribbon shaped clip in the mass in the 12 o'clock region of the right breast. The HydroMARK clip in the right axilla is not visualized secondary to the posterior location of the lymph node. IMPRESSION: Appropriate positioning of the ribbon shaped clip in the 12 o'clock region of the right breast. The HydroMARK clip was not visualized secondary to the posterior location of the lymph node. Final Assessment: Post Procedure Mammograms for Marker Placement Electronically Signed   By: Lillia Mountain M.D.   On: 02/03/2020 15:15   [place] RT BREAST BX W LOC DEV 1ST LESION IMG BX SPEC US GUIDE  Addendum Date: 02/08/2020   ADDENDUM REPORT: 02/08/2020 08:35 ADDENDUM: Pathology revealed GRADE III INVASIVE DUCTAL CARCINOMA of the RIGHT breast, 12 o'clock. This was found to be concordant by Dr. Lillia Mountain. Pathology revealed METASTATIC CARCINOMA INVOLVING A LYMPH NODE of the RIGHT axilla. This was found to be concordant by Dr. Lillia Mountain. Pathology results were discussed with the patient by telephone. The patient reported doing well after the biopsies with tenderness at the sites. Post biopsy instructions and care were reviewed and questions were answered. The patient was encouraged to call [facility] for any additional concerns. 2 additional ultrasound guided biopsies of RIGHT breast are scheduled for 02/06/2020. Surgical consultation has been arranged with Dr. Erroll Luna at [facility] on February 13, 2020. Pathology results reported by Stacie Acres RN on 02/08/2020.  Electronically Signed   By: Lillia Mountain M.D.   On: 02/08/2020 08:35   Result Date: 02/08/2020 CLINICAL DATA:  Suspicious mass in the 12 o'clock region of the right breast and suspicious right axillary lymph node. EXAM: ULTRASOUND GUIDED RIGHT BREAST CORE NEEDLE BIOPSIES COMPARISON:  Previous exam(s). PROCEDURE: I met with the patient and we discussed the procedure of ultrasound-guided biopsy, including benefits and alternatives. We discussed the high likelihood of a successful procedure. We discussed the risks of the procedure, including infection, bleeding, tissue injury, clip migration, and inadequate sampling. Informed written consent was given. The usual time-out protocol was performed immediately prior to the procedure. Lesion quadrant: 12 o'clock Using sterile technique and 1% lidocaine and 1% lidocaine with epinephrine as  local anesthetic, under direct ultrasound visualization, a 14 gauge spring-loaded device was used to perform biopsy of a mass in the 12 o'clock region of the right breast using a lateral to medial approach. At the conclusion of the procedure a ribbon shaped tissue marker clip was deployed into the biopsy cavity. Follow up 2 view mammogram was performed and dictated separately. I met with the patient and we discussed the procedure of ultrasound-guided biopsy, including benefits and alternatives. We discussed the high likelihood of a successful procedure. We discussed the risks of the procedure, including infection, bleeding, tissue injury, clip migration, and inadequate sampling. Informed written consent was given. The usual time-out protocol was performed immediately prior to the procedure. Lesion quadrant: Right axilla Using sterile technique and 1% lidocaine and 1% lidocaine with epinephrine as local anesthetic, under direct ultrasound visualization, a 14 gauge spring-loaded device was used to perform biopsy of a right axillary lymph node using a lateral to medial approach. At the  conclusion of the procedure [facility] tissue marker clip was deployed into the biopsy cavity. Follow up 2 view mammogram was performed and dictated separately. IMPRESSION: Ultrasound guided biopsies of a mass in the 12 o'clock region of the right breast and a right axillary lymph node. No apparent complications. Electronically Signed: By: Lillia Mountain M.D. On: 02/03/2020 14:46   [place] RT BREAST BX W LOC DEV 1ST LESION IMG BX SPEC US GUIDE  Addendum Date: 02/07/2020   ADDENDUM REPORT: 02/07/2020 15:00 ADDENDUM: Pathology revealed GRADE II INVASIVE DUCTAL CARCINOMA of the RIGHT breast, 11 o'clock mass. This was found to be concordant by Dr. Ammie Ferrier. 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 This was found to be concordant by Dr. Ammie Ferrier. Pathology results were discussed with the patient and her daughter by telephone. The patient reported doing well after the biopsies with tenderness at the sites. Post biopsy instructions and care were reviewed and questions were answered. The patient was encouraged to call [facility] for any additional concerns. Surgical consultation has been arranged with Dr. Erroll Luna at [facility] on February 13, 2020. Pathology results reported by Stacie Acres RN on 02/07/2020. Electronically Signed   By: Ammie Ferrier M.D.   On: 02/07/2020 15:00   Result Date: 02/07/2020 CLINICAL DATA:  81 year presenting for ultrasound-guided biopsy of 2 right breast masses. EXAM: ULTRASOUND GUIDED RIGHT BREAST CORE NEEDLE BIOPSY COMPARISON:  Previous exam(s). PROCEDURE: I met with the patient and we discussed the procedure of ultrasound-guided biopsy, including benefits and alternatives. We discussed the high likelihood of a successful procedure. We discussed the risks of the procedure, including infection, bleeding, tissue injury, clip migration, and inadequate sampling. Informed written consent was  given. The usual time-out protocol was performed immediately prior to the procedure. Lesion quadrant: Upper outer quadrant Using sterile technique and 1% Lidocaine as local anesthetic, under direct ultrasound visualization, a 14 gauge spring-loaded device was used to perform biopsy of a mass in right breast at 11 o'clock using an inferior approach. At the conclusion of the procedure a coil shaped tissue marker clip was deployed into the biopsy cavity. -------------------------------------------------- Lesion quadrant: Upper inner quadrant Using sterile technique and 1% Lidocaine as local anesthetic, under direct ultrasound visualization, a 14 gauge spring-loaded device was used to perform biopsy of a mass in the right breast at 1 o'clock using an inferolateral approach. At the conclusion of the procedure a heart shaped tissue marker clip was deployed into  the biopsy cavity. Follow up 2 view mammogram was performed and dictated separately. IMPRESSION: 1. Ultrasound guided biopsy of a right breast mass at 11 o'clock. No apparent complications. 2. Ultrasound-guided biopsy of a right breast mass at 1 o'clock. No apparent complications. Electronically Signed: By: Ammie Ferrier M.D. On: 02/06/2020 10:26   [place] RT BREAST BX W LOC DEV EA ADD LESION IMG BX SPEC US GUIDE  Addendum Date: 02/07/2020   ADDENDUM REPORT: 02/07/2020 15:00 ADDENDUM: Pathology revealed GRADE II INVASIVE DUCTAL CARCINOMA of the RIGHT breast, 11 o'clock mass. This was found to be concordant by Dr. Ammie Ferrier. 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 This was found to be concordant by Dr. Ammie Ferrier. Pathology results were discussed with the patient and her daughter by telephone. The patient reported doing well after the biopsies with tenderness at the sites. Post biopsy instructions and care were reviewed and questions were answered. The patient was encouraged to call [facility] for any additional concerns. Surgical consultation has been arranged with [facility]. Erroll Luna at [facility] on February 13, 2020. Pathology results reported by Stacie Acres RN on 02/07/2020. Electronically Signed   By: Ammie Ferrier M.D.   On: 02/07/2020 15:00   Result Date: 02/07/2020 CLINICAL DATA:  84 year presenting for ultrasound-guided biopsy of 2 right breast masses. EXAM: ULTRASOUND GUIDED RIGHT BREAST CORE NEEDLE BIOPSY COMPARISON:  Previous exam(s). PROCEDURE: I met with the patient and we discussed the procedure of ultrasound-guided biopsy, including benefits and alternatives. We discussed the high likelihood of a successful procedure. We discussed the risks of the procedure, including infection, bleeding, tissue injury, clip migration, and inadequate sampling. Informed written consent was given. The usual time-out protocol was performed immediately prior to the procedure. Lesion quadrant: Upper outer quadrant Using sterile technique and 1% Lidocaine as local anesthetic, under direct ultrasound visualization, a 14 gauge spring-loaded device was used to perform biopsy of a mass in right breast at 11 o'clock using an inferior approach. At the conclusion of the procedure a coil shaped tissue marker clip was deployed into the biopsy cavity. -------------------------------------------------- Lesion quadrant: Upper inner quadrant Using sterile technique and 1% Lidocaine as local anesthetic, under direct ultrasound visualization, a 14 gauge spring-loaded device was used to perform biopsy of a mass in the right breast at 1 o'clock using an inferolateral approach. At the conclusion of the procedure a heart shaped tissue marker clip was deployed into the biopsy cavity. Follow up 2 view mammogram was performed and dictated separately. IMPRESSION: 1. Ultrasound guided biopsy of a right breast mass at 11 o'clock. No apparent complications. 2. Ultrasound-guided biopsy of a right breast  mass at 1 o'clock. No apparent complications. Electronically Signed: By: Ammie Ferrier M.D. On: 02/06/2020 10:26     ELIGIBLE FOR AVAILABLE RESEARCH PROTOCOL: AET  ASSESSMENT: 64 y.o. 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  (a) biopsy of a second right upper outer quadrant T2 lesion (11 o'clock) 02/06/2020 also showed invasive ductal carcinoma, but grade 2, estrogen receptor positive, progesterone receptor and HER-2 negative, with an MIB-1 of 15%  (b) biopsy of a third right breast lesion (1:00) was benign    (1) neoadjuvant chemotherapy will consist of cyclophosphamide and docetaxel every 21  days x 4  (2) definitive surgery to follow  (3) adjuvant radiation  (4) antiestrogens  PLAN: I met today with Ivie to review her new diagnosis. Specifically we discussed the biology of her breast cancer, its diagnosis, staging, treatment  options and prognosis. We first reviewed the fact that cancer is not one disease but more than 100 different diseases and that it is important to keep them separate-- otherwise when friends and relatives discuss their own cancer experiences with Alannie confusion can result. Similarly we explained that if breast cancer spreads to the bone or liver, the patient would not have bone cancer or liver cancer, but breast cancer in the bone and breast cancer in the liver: one cancer in three places-- not 3 different cancers which otherwise would have to be treated in 3 different ways.  In terms of loco-regional treatment, lumpectomy plus radiation is equivalent to mastectomy as far as survival is concerned. For this reason, and because the cosmetic results are generally superior, we recommend breast conserving surgery.  We also  noted that in terms of sequencing of treatments, whether systemic therapy or surgery is done first does not affect the ultimate outcome.  In her case the surgeon would prefer that we do neoadjuvant chemo to shrink the tumor as much as possible prior to surgery.  This will make the surgery easier and provide better cosmesis.  Next we went over the options for systemic therapy which are anti-estrogens, anti-HER-2 immunotherapy, and chemotherapy. Glender does not meet criteria for anti-HER-2 immunotherapy. She is a good candidate for anti-estrogens.  The question of chemotherapy is more complicated. Chemotherapy is most effective in rapidly growing, aggressive tumors like her.  For that reason, and given the progesterone receptor negative a day, we are bypassing the Oncotype and proceeding with chemotherapy.  More specifically we are going to go with cyclophosphamide and docetaxel.  I do not believe Ms. Dexter could tolerate more aggressive or longer chemotherapy than that.  Tentatively we could start 03/05/2020.  Before that she would need to come for chemo school and have a port placed today we did discuss the possible toxicities of effects and complications and a preliminary way and she is particularly aware of concerns regarding hair loss and immunocompromise  I will see her 03/05/2020 at 9 AM and I will see her again a week later to make sure she tolerated the first cycle well  Nemiah has a good understanding of the overall plan. She agrees with it. She knows the goal of treatment in her case is cure. She will call with any problems that may develop before her next visit here.  Total encounter time 65 minutes.Sarajane Jews C. Braidyn Peace, MD 02/15/2020 4:21 PM Medical Oncology and Hematology [facility] [place], [place] [address] Tel. [phone]    Fax. [phone]   This document serves as a record of services personally performed by Lurline Del, MD. It was created  on his behalf by Wilburn Mylar, a trained medical scribe. The creation of this record is based on the scribe's personal observations and the provider's statements to them.   I, Lurline Del MD, have reviewed the above documentation for accuracy and completeness, and I agree with the above.    *Total Encounter Time as defined by the Centers for Medicare and Medicaid Services includes, in addition to the face-to-face time of a patient visit (documented in the note above) non-face-to-face time: obtaining and reviewing outside history, ordering and reviewing  medications, tests or procedures, care coordination (communications with other health care professionals or caregivers) and documentation in the medical record.  

## 2020-02-15 NOTE — Therapy (Signed)
[place], [place], [address] Phone: [phone]   Fax:  [phone]  Physical Therapy Evaluation  Patient Details  Name: [name] [name] MRN: [id number] Date of Birth: [birth year] Referring Provider (PT): Cornett   Encounter Date: 02/15/2020   PT End of Session - 02/15/20 1441    Visit Number 1    Number of Visits 2    Date for PT Re-Evaluation 04/11/20    PT Start Time 1402    PT Stop Time 1440    PT Time Calculation (min) 38 min    Activity Tolerance Patient tolerated treatment well    Behavior During Therapy [facility] for tasks assessed/performed           Past Medical History:  Diagnosis Date  . Arthritis   . Diabetes mellitus without complication ([place])   . Hypertension     History reviewed. No pertinent surgical history.  There were no vitals filed for this visit.    Subjective Assessment - 02/15/20 1405    Subjective I am not really sure why they sent me here today.    Patient is accompained by: Family member    Pertinent History R breast cancer with mets to lymphnodes, plan is to undergo a lumpectomy and node biopsy, diabetes, plan is to undergo radiation after the lumpectomy    Patient Stated Goals to gain info from provider    Currently in Pain? No/denies    Multiple Pain Sites No              [facility] PT Assessment - 02/15/20 0001      Assessment   Medical Diagnosis R breast cancer    Referring Provider (PT) Cornett    Hand Dominance Left    Prior Therapy none      Precautions   Precautions Other (comment)    Precaution Comments active cancer      Restrictions   Weight Bearing Restrictions No      Balance Screen   Has the patient fallen in the past 6 months No    Has the patient had a decrease in activity level because of a fear of falling?  Yes   pt reports she has bad knees and needs replacements   Is the patient reluctant to leave their home because of a fear of  falling?  No      Home Social worker Private residence    Living Arrangements Children   daughter   Available Help at Discharge Family    Type of [place]      Prior Function   Level of [place] Retired    Leisure pt reports she walks but not regularly      Cognition   Overall Cognitive Status Within Functional Limits for tasks assessed      Posture/Postural Control   Posture/Postural Control Postural limitations    Postural Limitations Rounded Shoulders;Forward head      ROM / Strength   AROM / PROM / Strength AROM      AROM   AROM Assessment Site Shoulder    Right/Left Shoulder Right;Left    Right Shoulder Extension 73 Degrees    Right Shoulder Flexion 161 Degrees    Right Shoulder ABduction 160 Degrees    Right Shoulder Internal Rotation 51 Degrees    Right Shoulder External Rotation 90 Degrees    Left Shoulder Extension 74 Degrees    Left Shoulder Flexion 170 Degrees  Left Shoulder ABduction 167 Degrees    Left Shoulder Internal Rotation 68 Degrees    Left Shoulder External Rotation 90 Degrees             LYMPHEDEMA/ONCOLOGY QUESTIONNAIRE - 02/15/20 0001      Type   Cancer Type right breast cancer      Lymphedema Assessments   Lymphedema Assessments Upper extremities      Right Upper Extremity Lymphedema   15 cm Proximal to Olecranon Process 32 cm    Olecranon Process 28 cm    15 cm Proximal to Ulnar Styloid Process 24.5 cm    Just Proximal to Ulnar Styloid Process 18 cm    Across Hand at [employer] 21 cm    At [place] of 2nd Digit 7 cm      Left Upper Extremity Lymphedema   15 cm Proximal to Olecranon Process 34.6 cm    Olecranon Process 29 cm    15 cm Proximal to Ulnar Styloid Process 26.1 cm    Just Proximal to Ulnar Styloid Process 18.5 cm    Across Hand at [employer] 23 cm    At [place] of 2nd Digit 7.5 cm           L-DEX FLOWSHEETS - 02/15/20 1400      L-DEX LYMPHEDEMA SCREENING    Measurement Type Unilateral    L-DEX MEASUREMENT EXTREMITY Upper Extremity    POSITION  Standing    DOMINANT SIDE Left    At Risk Side Right    BASELINE SCORE (UNILATERAL) 0           The patient was assessed using the L-Dex machine today to produce a lymphedema index baseline score. The patient will be reassessed on a regular basis (typically every 3 months) to obtain new L-Dex scores. If the score is > 6.5 points away from his/her baseline score indicating onset of subclinical lymphedema, it will be recommended to wear a compression garment for 4 weeks, 12 hours per day and then be reassessed. If the score continues to be > 6.5 points from baseline at reassessment, we will initiate lymphedema treatment. Assessing in this manner has a 95% rate of preventing clinically significant lymphedema.        Objective measurements completed on examination: See above findings.               PT Education - 02/15/20 1446    Education Details anatomy and physioogy of lymphatic system, lymphedema risk reduction practices, post op breast exercises, ABC class          Patient was instructed today in a home exercise program today for post op shoulder range of motion. These included active assist shoulder flexion in sitting, scapular retraction, wall walking with shoulder abduction, and hands behind head external rotation.  She was encouraged to do these twice a day, holding 3 seconds and repeating 5 times when permitted by her physician.         PT Long Term Goals - 02/15/20 1445      PT LONG TERM GOAL #1   Title Pt will return to baseline measurements for shoulder ROM and RUE circumferences to return to PLOF.    Time 8    Period Weeks    Status New    Target Date 04/11/20           Breast Clinic Goals - 02/15/20 1445      Patient will be able to verbalize understanding of pertinent  lymphedema risk reduction practices relevant to her diagnosis specifically related to skin  care.   Time 1    Period Days    Status Achieved      Patient will be able to return demonstrate and/or verbalize understanding of the post-op home exercise program related to regaining shoulder range of motion.   Time 1    Period Days    Status Achieved      Patient will be able to verbalize understanding of the importance of attending the postoperative After Breast Cancer Class for further lymphedema risk reduction education and therapeutic exercise.   Time 1    Period Days    Status Achieved                 Plan - 02/15/20 1441    Clinical Impression Statement Pt presents to PT with recently diagnosed R breast cancer that has metastasized the the lymph nodes. She will be having an MRI to look for additional mets. Pt plans on undergoing a lumpectomy and either node biopsy of dissection in the next 2 weeks. She will then have to complete radiation.Pt was instructed in post op exercises and educated that if she has a mastectomy then not to begin exercises until a week after the last drain is removed. Baseline ROM and SOZO measurements taken today. She will benefit from a post op PT reassessment to determine needs and in every 3 months for additional L dex screening to detect subclinical lymphedema.    Personal Factors and Comorbidities Comorbidity 1    Comorbidities diabetes    Stability/Clinical Decision Making Stable/Uncomplicated    Clinical Decision Making Low    Rehab Potential Excellent    PT Frequency --   eval and 1 f/u visit post op   PT Duration 8 weeks    PT Treatment/Interventions ADLs/Self Care Home Management;Patient/family education;Therapeutic exercise    PT Next Visit Plan remeasure baselines    PT Home Exercise Plan post op breast ex    Consulted and Agree with Plan of Care Patient           Patient will benefit from skilled therapeutic intervention in order to improve the following deficits and impairments:  Postural dysfunction, Pain, Decreased knowledge  of precautions  Visit Diagnosis: Abnormal posture  Malignant neoplasm of right female breast, unspecified estrogen receptor status, unspecified site of breast [facility])   Patient will follow up at outpatient cancer rehab 3-4 weeks following surgery.  If the patient requires physical therapy at that time, a specific plan will be dictated and sent to the referring physician for approval. The patient was educated today on appropriate basic range of motion exercises to begin post operatively and the importance of attending the After Breast Cancer class following surgery.  Patient was educated today on lymphedema risk reduction practices as it pertains to recommendations that will benefit the patient immediately following surgery.  She verbalized good understanding.      Problem List Patient Active Problem List   Diagnosis Date Noted  . Malignant neoplasm of upper-outer quadrant of right breast in female, estrogen receptor positive ([place]) 02/13/2020    Allyson Sabal [facility] 02/15/2020, 2:47 PM  [place], [place], [address] Phone: [phone]   Fax:  [phone]  Name: Demaria Deeney MRN: [id number] Date of Birth: 30-Sep-1955  Manus Gunning, PT 02/15/20 2:47 PM

## 2020-02-16 ENCOUNTER — Encounter: Payer: Self-pay | Admitting: *Deleted

## 2020-02-16 ENCOUNTER — Telehealth: Payer: Self-pay | Admitting: *Deleted

## 2020-02-16 NOTE — Telephone Encounter (Signed)
Called pt and spoke to daughter [name]. Provided navigation resources and contact information. Denies questions or concerns regarding dx or treatment care plan. Discussed port placement with IR as well as calls for chemo class and chemo infusions. Encourage pt and daughter to call with needs. Received verbal understanding.

## 2020-02-20 ENCOUNTER — Telehealth: Payer: Self-pay | Admitting: Oncology

## 2020-02-20 ENCOUNTER — Encounter: Payer: Self-pay | Admitting: Licensed Clinical Social Worker

## 2020-02-20 NOTE — Telephone Encounter (Signed)
Per 10/6 LOS called patient  Patient is aware of upcoming appointments

## 2020-02-20 NOTE — Progress Notes (Signed)
[place] Work  Holiday representative contacted pt's daughter by phone to offer support and assess for needs for patient with newly diagnosed breast cancer.  Pt lives with daughter, son-in-law, and grandchildren. No current resource concerns in regard to transportation, food, housing, utilities.  Patient has application pending submission for [employer] Medicaid.  CSW informed pt's daughter of the support team and support services at [facility].  CSW provided contact information and encouraged patient to call with any questions or concerns.      Jaquala Fuller, [place], [place] Worker [facility]

## 2020-02-21 ENCOUNTER — Encounter: Payer: Self-pay | Admitting: *Deleted

## 2020-02-22 ENCOUNTER — Other Ambulatory Visit: Payer: Self-pay | Admitting: Physician Assistant

## 2020-02-22 ENCOUNTER — Ambulatory Visit
Admission: RE | Admit: 2020-02-22 | Discharge: 2020-02-22 | Disposition: A | Discharge status: Home / Self Care | Payer: No Typology Code available for payment source | Source: Ambulatory Visit | Attending: Radiation Oncology | Admitting: Radiation Oncology

## 2020-02-22 ENCOUNTER — Encounter: Payer: Self-pay | Admitting: Radiation Oncology

## 2020-02-22 ENCOUNTER — Other Ambulatory Visit: Payer: Self-pay | Admitting: Student

## 2020-02-22 DIAGNOSIS — Z170 Estrogen receptor positive status [ER+]: Secondary | ICD-10-CM

## 2020-02-22 NOTE — Progress Notes (Signed)
Radiation Oncology         [phone] ________________________________  Initial Outpatient Consultation by telephone.  The patient opted for telemedicine to maximize safety during the pandemic.  MyChart video was not obtainable.   Name: [name] [name] MRN: [id number]  Date: 02/22/2020  DOB: 09-09-1955  FT:DDUKGUR, No Pcp Per  Erroll Luna, MD   REFERRING PHYSICIAN: Erroll Luna, MD  DIAGNOSIS:    ICD-10-CM   1. Malignant neoplasm of upper-outer quadrant of right breast in female, estrogen receptor positive ([place])  C50.411    Z17.0     Cancer Staging Malignant neoplasm of upper-outer quadrant of right breast in female, estrogen receptor positive ([place]) Staging form: Breast, AJCC 8th Edition - Clinical stage from 02/06/2020: Stage IIIA (cT2, cN1, cM0, G3, ER+, PR-, HER2-) - Signed by Eppie Gibson, MD on 02/22/2020  Stage IIB (T1c, N1) Right Breast UOQ, Invasive Ductal Carcinoma, ER+ / PR- / Her2-, Grade 3  CHIEF COMPLAINT: Here to discuss management of right breast cancer  HISTORY OF PRESENT ILLNESS::[name] [name] is a 64 y.o. female who presented with right breast abnormality on the following imaging: bilateral diagnostic mammogram on the date of 01/31/2020. Symptoms, if any, at that time, were: three-month history of right breast lump and right axillary pain. Ultrasound of breast on 01/31/2020 revealed three suspicious masses within the right breast that were concerning for malignancy. It also showed two adjacent enlarged thickened right axillary lymph nodes that were concerning for metastatic adenopathy. Biopsy of the mass at the 12 o'clock position on the date of 02/03/2020 showed invasive ductal carcinoma with metastatic carcinoma involving a right axillary lymph node. ER status: 100% strong; PR status: 0%, Her2 status: negative; Grade 3. Biopsy of the mass at the 11 o'clock position on the date of 02/06/2020 showed invasive ductal carcinoma. ER status: 100% strong; PR status: 0%,  Her2 status: negative; Grade 2. An additional mass at the 1 o'clock position was also biopsied and showed fibrocystic change without malignancy.  The patient was seen in consultation with Dr. Brantley Stage on 02/13/2020 and with Dr. Jana Hakim on 02/15/2020. It was been recommended that she proceed with neoadjuvant chemotherapy consisting of Cyclophosphamide and Docetaxel followed by surgery, adjuvant radiation, and antiestrogens.  Lymphedema issues, if any:  Patient denies     Pain issues, if any:  Patient denies any breast pain, but does report chronic right hip pain (she states he has some impact on her mobility)   SAFETY ISSUES:  Prior radiation? No  Pacemaker/ICD? No  Possible current pregnancy? No-postmenopausal  Is the patient on methotrexate? No  She received the [employer] COVID-19 vaccine in [place].  PREVIOUS RADIATION THERAPY: No  PAST MEDICAL HISTORY:  has a past medical history of Arthritis, Diabetes mellitus without complication ([place]), and Hypertension.    PAST SURGICAL HISTORY:History reviewed. No pertinent surgical history.  FAMILY HISTORY: family history includes Arthritis in her brother and sister; Asthma in her brother and mother.  SOCIAL HISTORY:  reports that she has never smoked. She has never used smokeless tobacco. She reports current alcohol use of about 1.0 standard drink of alcohol per week. She reports that she does not use drugs.  ALLERGIES: Sulfa antibiotics  MEDICATIONS:  Current Outpatient Medications  Medication Sig Dispense Refill  . amLODipine (NORVASC) 5 MG tablet Take 5 mg by mouth daily.    . ASPIRIN 81 81 MG chewable tablet Chew 1 tablet by mouth daily.    Marland Kitchen atorvastatin (LIPITOR) 40 MG tablet Take 40 mg by mouth daily.    Marland Kitchen  carvedilol (COREG) 6.25 MG tablet Take 6.25 mg by mouth 2 (two) times daily with a meal.    . celecoxib (CELEBREX) 200 MG capsule Take 200 mg by mouth daily.     Marland Kitchen dexamethasone (DECADRON) 4 MG tablet Take 2 tablets (8 mg  total) by mouth 2 (two) times daily. Start the day before Taxotere. Then again the day after chemo for 3 days. 30 tablet 1  . lansoprazole (PREVACID) 30 MG capsule Take 30 mg by mouth every morning.    . lidocaine-prilocaine (EMLA) cream Apply to affected area once 30 g 3  . LORazepam (ATIVAN) 0.5 MG tablet Take 1 tablet (0.5 mg total) by mouth at bedtime as needed (Nausea or vomiting). 30 tablet 0  . losartan (COZAAR) 100 MG tablet Take 100 mg by mouth daily.    . metFORMIN (GLUCOPHAGE) 1000 MG tablet Take 1,000 mg by mouth daily with breakfast.     . prochlorperazine (COMPAZINE) 10 MG tablet Take 1 tablet (10 mg total) by mouth 4 (four) times daily -  before meals and at bedtime. Start the evening of chemotherapy and continue for 2 days, then take as needed. 30 tablet 1   No current facility-administered medications for this encounter.    REVIEW OF SYSTEMS: As above   PHYSICAL EXAM:  vitals were not taken for this visit.   General: Alert and oriented, in no acute distress   LABORATORY DATA:  Lab Results  Component Value Date   WBC 6.3 02/15/2020   HGB 12.2 02/15/2020   HCT 38.2 02/15/2020   MCV 89.5 02/15/2020   PLT 252 02/15/2020   CMP     Component Value Date/Time   NA 140 02/15/2020 1552   K 4.0 02/15/2020 1552   CL 105 02/15/2020 1552   CO2 30 02/15/2020 1552   GLUCOSE 132 (H) 02/15/2020 1552   BUN 9 02/15/2020 1552   CREATININE 0.75 02/15/2020 1552   CALCIUM 9.5 02/15/2020 1552   PROT 7.1 02/15/2020 1552   ALBUMIN 4.0 02/15/2020 1552   AST 23 02/15/2020 1552   ALT 38 02/15/2020 1552   ALKPHOS 81 02/15/2020 1552   BILITOT 0.5 02/15/2020 1552   GFRNONAA >60 02/15/2020 1552         RADIOGRAPHY: US BREAST LTD UNI LEFT INC AXILLA  Result Date: 01/31/2020 CLINICAL DATA:  Patient presents for palpable mass within the right axilla. EXAM: DIGITAL DIAGNOSTIC BILATERAL MAMMOGRAM WITH CAD AND TOMO ULTRASOUND BILATERAL BREAST COMPARISON:  None. ACR Breast Density Category  b: There are scattered areas of fibroglandular density. FINDINGS: Underlying the palpable marker within the right axilla are 2 adjacent enlarged lymph nodes. Within the anterior central right breast there are 2 adjacent irregular masses measuring 1.6 and 2.0 cm. Within the upper inner right breast posterior depth there is a persistent small lobular mass. Questioned asymmetry within the outer right breast appeared to resolve with additional imaging. Questioned asymmetry within the anterior left breast appeared to resolve with additional imaging. Mammographic images were processed with CAD. Targeted ultrasound is performed, showing a 1.9 x 1.7 x 1.7 cm irregular hypoechoic right breast mass 12 o'clock position 1 cm from nipple. There is an adjacent 2.0 x 2.3 x 1.0 cm irregular hypoechoic mass right breast 11 o'clock position 3 cm from nipple. There is a 0.4 x 0.3 x 0.3 cm irregular hypoechoic mass right breast 1 o'clock position 8 cm from nipple. There are 2 adjacent enlarged thickened lymph nodes measuring up to 2.1 cm. No suspicious abnormality  within the retroareolar left breast. Dense tissue is visualized. IMPRESSION: 1. There are 3 suspicious masses within the right breast concerning primary breast malignancy. 2. Two adjacent enlarged thickened right axillary lymph nodes concerning for metastatic adenopathy. RECOMMENDATION: Ultrasound-guided core needle biopsy of the 3 right breast masses and 1 of the thickened right axillary lymph nodes. Patient will be scheduled for 2 separate days. On the first biopsy day, recommend biopsy of the right breast mass 12 o'clock position and 1 of the thickened right axillary lymph nodes. On the second biopsy day, recommend biopsy of the right breast masses 11 o'clock position and 1 o'clock position. I have discussed the findings and recommendations with the patient. If applicable, a reminder letter will be sent to the patient regarding the next appointment. BI-RADS CATEGORY  5:  Highly suggestive of malignancy. Electronically Signed   By: Lovey Newcomer M.D.   On: 01/31/2020 13:37   US BREAST LTD UNI RIGHT INC AXILLA  Result Date: 01/31/2020 CLINICAL DATA:  Patient presents for palpable mass within the right axilla. EXAM: DIGITAL DIAGNOSTIC BILATERAL MAMMOGRAM WITH CAD AND TOMO ULTRASOUND BILATERAL BREAST COMPARISON:  None. ACR Breast Density Category b: There are scattered areas of fibroglandular density. FINDINGS: Underlying the palpable marker within the right axilla are 2 adjacent enlarged lymph nodes. Within the anterior central right breast there are 2 adjacent irregular masses measuring 1.6 and 2.0 cm. Within the upper inner right breast posterior depth there is a persistent small lobular mass. Questioned asymmetry within the outer right breast appeared to resolve with additional imaging. Questioned asymmetry within the anterior left breast appeared to resolve with additional imaging. Mammographic images were processed with CAD. Targeted ultrasound is performed, showing a 1.9 x 1.7 x 1.7 cm irregular hypoechoic right breast mass 12 o'clock position 1 cm from nipple. There is an adjacent 2.0 x 2.3 x 1.0 cm irregular hypoechoic mass right breast 11 o'clock position 3 cm from nipple. There is a 0.4 x 0.3 x 0.3 cm irregular hypoechoic mass right breast 1 o'clock position 8 cm from nipple. There are 2 adjacent enlarged thickened lymph nodes measuring up to 2.1 cm. No suspicious abnormality within the retroareolar left breast. Dense tissue is visualized. IMPRESSION: 1. There are 3 suspicious masses within the right breast concerning primary breast malignancy. 2. Two adjacent enlarged thickened right axillary lymph nodes concerning for metastatic adenopathy. RECOMMENDATION: Ultrasound-guided core needle biopsy of the 3 right breast masses and 1 of the thickened right axillary lymph nodes. Patient will be scheduled for 2 separate days. On the first biopsy day, recommend biopsy of the right  breast mass 12 o'clock position and 1 of the thickened right axillary lymph nodes. On the second biopsy day, recommend biopsy of the right breast masses 11 o'clock position and 1 o'clock position. I have discussed the findings and recommendations with the patient. If applicable, a reminder letter will be sent to the patient regarding the next appointment. BI-RADS CATEGORY  5: Highly suggestive of malignancy. Electronically Signed   By: Lovey Newcomer M.D.   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 This was found to be concordant by Dr. Lillia Mountain. Pathology revealed METASTATIC CARCINOMA INVOLVING A LYMPH NODE of the RIGHT axilla. This was found to be concordant by Dr. Lillia Mountain. Pathology results were discussed with the patient by telephone. The  patient reported doing well after the biopsies with tenderness at the sites. Post biopsy instructions and care were reviewed and questions were answered. The patient was encouraged to call [facility] for any additional concerns. 2 additional ultrasound guided biopsies of RIGHT breast are scheduled for 02/06/2020. Surgical consultation has been arranged with Dr. Erroll Luna at [facility] on February 13, 2020. Pathology results reported by Stacie Acres RN on 02/08/2020. Electronically Signed   By: Lillia Mountain M.D.   On: 02/08/2020 08:35   Result Date: 02/08/2020 CLINICAL DATA:  Suspicious mass in the 12 o'clock region of the right breast and suspicious right axillary lymph node. EXAM: ULTRASOUND GUIDED RIGHT BREAST CORE NEEDLE BIOPSIES COMPARISON:  Previous exam(s). PROCEDURE: I met with the patient and we discussed the procedure of ultrasound-guided biopsy, including benefits and alternatives. We discussed the high likelihood of a successful procedure. We  discussed the risks of the procedure, including infection, bleeding, tissue injury, clip migration, and inadequate sampling. Informed written consent was given. The usual time-out protocol was performed immediately prior to the procedure. Lesion quadrant: 12 o'clock Using sterile technique and 1% lidocaine and 1% lidocaine with epinephrine as local anesthetic, under direct ultrasound visualization, a 14 gauge spring-loaded device was used to perform biopsy of a mass in the 12 o'clock region of the right breast using a lateral to medial approach. At the conclusion of the procedure a ribbon shaped tissue marker clip was deployed into the biopsy cavity. Follow up 2 view mammogram was performed and dictated separately. I met with the patient and we discussed the procedure of ultrasound-guided biopsy, including benefits and alternatives. We discussed the high likelihood of a successful procedure. We discussed the risks of the procedure, including infection, bleeding, tissue injury, clip migration, and inadequate sampling. Informed written consent was given. The usual time-out protocol was performed immediately prior to the procedure. Lesion quadrant: Right axilla Using sterile technique and 1% lidocaine and 1% lidocaine with epinephrine as local anesthetic, under direct ultrasound visualization, a 14 gauge spring-loaded device was used to perform biopsy of a right axillary lymph node using a lateral to medial approach. At the conclusion of the procedure [facility] tissue marker clip was deployed into the biopsy cavity. Follow up 2 view mammogram was performed and dictated separately. IMPRESSION: Ultrasound guided biopsies of a mass in the 12 o'clock region of the right breast and a right axillary lymph node. No apparent complications. Electronically Signed: By: Lillia Mountain M.D. On: 02/03/2020 14:46   MS DIGITAL DIAG TOMO BILAT  Result Date: 01/31/2020 CLINICAL DATA:  Patient presents for palpable mass within the right  axilla. EXAM: DIGITAL DIAGNOSTIC BILATERAL MAMMOGRAM WITH CAD AND TOMO ULTRASOUND BILATERAL BREAST COMPARISON:  None. ACR Breast Density Category b: There are scattered areas of fibroglandular density. FINDINGS: Underlying the palpable marker within the right axilla are 2 adjacent enlarged lymph nodes. Within the anterior central right breast there are 2 adjacent irregular masses measuring 1.6 and 2.0 cm. Within the upper inner right breast posterior depth there is a persistent small lobular mass. Questioned asymmetry within the outer right breast appeared to resolve with additional imaging. Questioned asymmetry within the anterior left breast appeared to resolve with additional imaging. Mammographic images were processed with CAD. Targeted ultrasound is performed, showing a 1.9 x 1.7 x 1.7 cm irregular hypoechoic right breast mass 12 o'clock position 1 cm from nipple. There is an adjacent 2.0 x 2.3 x 1.0 cm irregular hypoechoic mass right breast  11 o'clock position 3 cm from nipple. There is a 0.4 x 0.3 x 0.3 cm irregular hypoechoic mass right breast 1 o'clock position 8 cm from nipple. There are 2 adjacent enlarged thickened lymph nodes measuring up to 2.1 cm. No suspicious abnormality within the retroareolar left breast. Dense tissue is visualized. IMPRESSION: 1. There are 3 suspicious masses within the right breast concerning primary breast malignancy. 2. Two adjacent enlarged thickened right axillary lymph nodes concerning for metastatic adenopathy. RECOMMENDATION: Ultrasound-guided core needle biopsy of the 3 right breast masses and 1 of the thickened right axillary lymph nodes. Patient will be scheduled for 2 separate days. On the first biopsy day, recommend biopsy of the right breast mass 12 o'clock position and 1 of the thickened right axillary lymph nodes. On the second biopsy day, recommend biopsy of the right breast masses 11 o'clock position and 1 o'clock position. I have discussed the findings and  recommendations with the patient. If applicable, a reminder letter will be sent to the patient regarding the next appointment. BI-RADS CATEGORY  5: Highly suggestive of malignancy. Electronically Signed   By: Lovey Newcomer M.D.   On: 01/31/2020 13:37   MM CLIP PLACEMENT RIGHT  Result Date: 02/06/2020 CLINICAL DATA:  Post biopsy mammogram of the right breast for clip placement. EXAM: DIAGNOSTIC RIGHT MAMMOGRAM POST ULTRASOUND BIOPSY COMPARISON:  Previous exam(s). FINDINGS: Mammographic images were obtained following ultrasound guided biopsy of 2 masses in the right breast. The biopsy marking clips in expected position at the sites of biopsy. IMPRESSION: 1. Appropriate positioning of the coil shaped biopsy marking clip at the site of biopsy in the upper-outer right breast (11 o'clock). 2. Appropriate positioning of the heart shaped biopsy marking clip in the upper inner right breast (1 o'clock). Final Assessment: Post Procedure Mammograms for Marker Placement Electronically Signed   By: Ammie Ferrier M.D.   On: 02/06/2020 10:34   MM CLIP PLACEMENT RIGHT  Result Date: 02/03/2020 CLINICAL DATA:  Status post ultrasound-guided core biopsies of a mass in the 12 o'clock region of the right breast and a right axillary lymph node. EXAM: 3D DIAGNOSTIC right MAMMOGRAM POST ULTRASOUND BIOPSIES COMPARISON:  Previous exam(s). FINDINGS: 3D Mammographic images were obtained following ultrasound guided core biopsies of a mass in the 12 o'clock region of the right breast and a right axillary lymph node. Mammographic images show there is a ribbon shaped clip in the mass in the 12 o'clock region of the right breast. The HydroMARK clip in the right axilla is not visualized secondary to the posterior location of the lymph node. IMPRESSION: Appropriate positioning of the ribbon shaped clip in the 12 o'clock region of the right breast. The HydroMARK clip was not visualized secondary to the posterior location of the lymph node.  Final Assessment: Post Procedure Mammograms for Marker Placement Electronically Signed   By: Lillia Mountain M.D.   On: 02/03/2020 15:15   [place] RT BREAST BX W LOC DEV 1ST LESION IMG BX SPEC US GUIDE  Addendum Date: 02/08/2020   ADDENDUM REPORT: 02/08/2020 08:35 ADDENDUM: Pathology revealed GRADE III INVASIVE DUCTAL CARCINOMA of the RIGHT breast, 12 o'clock. This was found to be concordant by Dr. Lillia Mountain. Pathology revealed METASTATIC CARCINOMA INVOLVING A LYMPH NODE of the RIGHT axilla. This was found to be concordant by Dr. Lillia Mountain. Pathology results were discussed with the patient by telephone. The patient reported doing well after the biopsies with tenderness at the sites. Post biopsy instructions and care were reviewed and questions  were answered. The patient was encouraged to call [facility] for any additional concerns. 2 additional ultrasound guided biopsies of RIGHT breast are scheduled for 02/06/2020. Surgical consultation has been arranged with Dr. Erroll Luna at [facility] on February 13, 2020. Pathology results reported by Stacie Acres RN on 02/08/2020. Electronically Signed   By: Lillia Mountain M.D.   On: 02/08/2020 08:35   Result Date: 02/08/2020 CLINICAL DATA:  Suspicious mass in the 12 o'clock region of the right breast and suspicious right axillary lymph node. EXAM: ULTRASOUND GUIDED RIGHT BREAST CORE NEEDLE BIOPSIES COMPARISON:  Previous exam(s). PROCEDURE: I met with the patient and we discussed the procedure of ultrasound-guided biopsy, including benefits and alternatives. We discussed the high likelihood of a successful procedure. We discussed the risks of the procedure, including infection, bleeding, tissue injury, clip migration, and inadequate sampling. Informed written consent was given. The usual time-out protocol was performed immediately prior to the procedure. Lesion quadrant: 12 o'clock Using sterile technique and 1% lidocaine and 1% lidocaine  with epinephrine as local anesthetic, under direct ultrasound visualization, a 14 gauge spring-loaded device was used to perform biopsy of a mass in the 12 o'clock region of the right breast using a lateral to medial approach. At the conclusion of the procedure a ribbon shaped tissue marker clip was deployed into the biopsy cavity. Follow up 2 view mammogram was performed and dictated separately. I met with the patient and we discussed the procedure of ultrasound-guided biopsy, including benefits and alternatives. We discussed the high likelihood of a successful procedure. We discussed the risks of the procedure, including infection, bleeding, tissue injury, clip migration, and inadequate sampling. Informed written consent was given. The usual time-out protocol was performed immediately prior to the procedure. Lesion quadrant: Right axilla Using sterile technique and 1% lidocaine and 1% lidocaine with epinephrine as local anesthetic, under direct ultrasound visualization, a 14 gauge spring-loaded device was used to perform biopsy of a right axillary lymph node using a lateral to medial approach. At the conclusion of the procedure [facility] tissue marker clip was deployed into the biopsy cavity. Follow up 2 view mammogram was performed and dictated separately. IMPRESSION: Ultrasound guided biopsies of a mass in the 12 o'clock region of the right breast and a right axillary lymph node. No apparent complications. Electronically Signed: By: Lillia Mountain M.D. On: 02/03/2020 14:46   [place] RT BREAST BX W LOC DEV 1ST LESION IMG BX SPEC US GUIDE  Addendum Date: 02/07/2020   ADDENDUM REPORT: 02/07/2020 15:00 ADDENDUM: Pathology revealed GRADE II INVASIVE DUCTAL CARCINOMA of the RIGHT breast, 11 o'clock mass. This was found to be concordant by Dr. Ammie Ferrier. 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 This was found to be concordant by Dr. Ammie Ferrier. Pathology results were discussed  with the patient and her daughter by telephone. The patient reported doing well after the biopsies with tenderness at the sites. Post biopsy instructions and care were reviewed and questions were answered. The patient was encouraged to call [facility] for any additional concerns. Surgical consultation has been arranged with Dr. Erroll Luna at [facility] on February 13, 2020. Pathology results reported by Stacie Acres RN on 02/07/2020. Electronically Signed   By: Ammie Ferrier M.D.   On: 02/07/2020 15:00   Result Date: 02/07/2020 CLINICAL DATA:  39 year presenting for ultrasound-guided biopsy of 2 right breast masses. EXAM: ULTRASOUND GUIDED RIGHT BREAST CORE  NEEDLE BIOPSY COMPARISON:  Previous exam(s). PROCEDURE: I met with the patient and we discussed the procedure of ultrasound-guided biopsy, including benefits and alternatives. We discussed the high likelihood of a successful procedure. We discussed the risks of the procedure, including infection, bleeding, tissue injury, clip migration, and inadequate sampling. Informed written consent was given. The usual time-out protocol was performed immediately prior to the procedure. Lesion quadrant: Upper outer quadrant Using sterile technique and 1% Lidocaine as local anesthetic, under direct ultrasound visualization, a 14 gauge spring-loaded device was used to perform biopsy of a mass in right breast at 11 o'clock using an inferior approach. At the conclusion of the procedure a coil shaped tissue marker clip was deployed into the biopsy cavity. -------------------------------------------------- Lesion quadrant: Upper inner quadrant Using sterile technique and 1% Lidocaine as local anesthetic, under direct ultrasound visualization, a 14 gauge spring-loaded device was used to perform biopsy of a mass in the right breast at 1 o'clock using an inferolateral approach. At the conclusion of the procedure a heart shaped  tissue marker clip was deployed into the biopsy cavity. Follow up 2 view mammogram was performed and dictated separately. IMPRESSION: 1. Ultrasound guided biopsy of a right breast mass at 11 o'clock. No apparent complications. 2. Ultrasound-guided biopsy of a right breast mass at 1 o'clock. No apparent complications. Electronically Signed: By: Ammie Ferrier M.D. On: 02/06/2020 10:26   [place] RT BREAST BX W LOC DEV EA ADD LESION IMG BX SPEC US GUIDE  Addendum Date: 02/07/2020   ADDENDUM REPORT: 02/07/2020 15:00 ADDENDUM: Pathology revealed GRADE II INVASIVE DUCTAL CARCINOMA of the RIGHT breast, 11 o'clock mass. This was found to be concordant by Dr. Ammie Ferrier. 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 This was found to be concordant by Dr. Ammie Ferrier. Pathology results were discussed with the patient and her daughter by telephone. The patient reported doing well after the biopsies with tenderness at the sites. Post biopsy instructions and care were reviewed and questions were answered. The patient was encouraged to call [facility] for any additional concerns. Surgical consultation has been arranged with Dr. Erroll Luna at [facility] on February 13, 2020. Pathology results reported by Stacie Acres RN on 02/07/2020. Electronically Signed   By: Ammie Ferrier M.D.   On: 02/07/2020 15:00   Result Date: 02/07/2020 CLINICAL DATA:  69 year presenting for ultrasound-guided biopsy of 2 right breast masses. EXAM: ULTRASOUND GUIDED RIGHT BREAST CORE NEEDLE BIOPSY COMPARISON:  Previous exam(s). PROCEDURE: I met with the patient and we discussed the procedure of ultrasound-guided biopsy, including benefits and alternatives. We discussed the high likelihood of a successful procedure. We discussed the risks of the procedure, including infection, bleeding, tissue injury, clip migration, and inadequate sampling. Informed written  consent was given. The usual time-out protocol was performed immediately prior to the procedure. Lesion quadrant: Upper outer quadrant Using sterile technique and 1% Lidocaine as local anesthetic, under direct ultrasound visualization, a 14 gauge spring-loaded device was used to perform biopsy of a mass in right breast at 11 o'clock using an inferior approach. At the conclusion of the procedure a coil shaped tissue marker clip was deployed into the biopsy cavity. -------------------------------------------------- Lesion quadrant: Upper inner quadrant Using sterile technique and 1% Lidocaine as local anesthetic, under direct ultrasound visualization, a 14 gauge spring-loaded device was used to perform biopsy of a mass in the right breast at 1 o'clock using an inferolateral approach. At the conclusion of  the procedure a heart shaped tissue marker clip was deployed into the biopsy cavity. Follow up 2 view mammogram was performed and dictated separately. IMPRESSION: 1. Ultrasound guided biopsy of a right breast mass at 11 o'clock. No apparent complications. 2. Ultrasound-guided biopsy of a right breast mass at 1 o'clock. No apparent complications. Electronically Signed: By: Ammie Ferrier M.D. On: 02/06/2020 10:26      IMPRESSION/PLAN: Right breast cancer  It was a pleasure meeting the patient today. We discussed the risks, benefits, and side effects of radiotherapy. I recommend radiotherapy to the right breast and regional nodes to reduce her risk of locoregional recurrence by 2/3.  We discussed that radiation would take approximately 6 weeks to complete and that I would give the patient a few weeks to heal following surgery before starting treatment planning.  We spoke about acute effects including skin irritation and fatigue as well as much less common late effects including internal organ injury or irritation. We spoke about the latest technology that is used to minimize the risk of late effects for patients  undergoing radiotherapy to the breast or chest wall. No guarantees of treatment were given. The patient is enthusiastic about proceeding with treatment. I look forward to participating in the patient's care.  I will await her referral back to me for postoperative follow-up and eventual CT simulation/treatment planning.  On date of service, in total, I spent 32 minutes on this encounter.  This encounter was provided by telemedicine platform by telephone.  The patient opted for telemedicine to maximize safety during the pandemic.  MyChart video was not obtainable. The patient has given verbal consent for this type of encounter and has been advised to only accept a meeting of this type in a secure network environment. The attendants for this meeting include Eppie Gibson  and [name].  During the encounter, Eppie Gibson was located at [facility].  Sierria Bruney was located at home.    __________________________________________   Eppie Gibson, MD  This document serves as a record of services personally performed by Eppie Gibson, MD. It was created on his behalf by Clerance Lav, a trained medical scribe. The creation of this record is based on the scribe's personal observations and the provider's statements to them. This document has been checked and approved by the attending provider.

## 2020-02-22 NOTE — Progress Notes (Signed)
Location of Breast Cancer: Malignant neoplasm of upper-outer quadrant of RIGHT breast, estrogen receptor positive   Histology per Pathology Report: (definitive pathology pending on future lumpectomy) 02/03/2020 Diagnosis 1. Breast, right, needle core biopsy, 12 o'clock - INVASIVE DUCTAL CARCINOMA - SEE COMMENT 2. Lymph node, needle/core biopsy, axilla - METASTATIC CARCINOMA INVOLVING A LYMPH NODE - SEE COMMENT  Receptor Status:  Breast: ER(100%), PR (0%), Her2-neu (negative), Ki-67(60%) Lymph node: ER(100%), PR (0%), Her2-neu (equivocal), Ki-67(70%)  Did patient present with symptoms (if so, please note symptoms) or was this found on screening mammography?:  Patient palpated a mass in the right axilla "a long time ago", more recently with complaints of associated pain. She brought it to medical attention and underwent bilateral diagnostic mammography with tomography and bilateral breast ultrasonography at [facility] on 01/31/2020 showing: breast density category B; three right breast masses-- 1.9 cm at 12 o'clock, 2.3 cm at 11 o'clock, 0.4 cm at 1 o'clock; two adjacent enlarged thickened lymph nodes measuring up to 2.1 cm; no evidence of left breast malignancy.  Past/Anticipated interventions by surgeon, if any: Plan for right breast lumpectomy and axillary node dissection by Dr. Marcello Moores Cornett: TBD  Past/Anticipated interventions by medical oncology, if any:  Under care of Dr. Sarajane Jews Magrinat 02/15/2020 (1) neoadjuvant chemotherapy will consist of cyclophosphamide and docetaxel every 21 days x 4 (2) definitive surgery to follow (3) adjuvant radiation (4) antiestrogens I will see her 03/05/2020 at 9 AM and I will see her again a week later to make sure she tolerated the first cycle well  Lymphedema issues, if any:  Patient denies     Pain issues, if any:  Patient denies any breast pain, but does report chronic right hip pain (she states he has some impact on her mobility)    SAFETY ISSUES:  Prior radiation? No  Pacemaker/ICD? No  Possible current pregnancy? No-postmenopausal  Is the patient on methotrexate? No  Current Complaints / other details:  Received COVID vaccine [employer]eron[employer]) in [place]. Lives with her daughter, son-in-law and 2 grandchildren

## 2020-02-23 ENCOUNTER — Ambulatory Visit (HOSPITAL_COMMUNITY)
Admission: RE | Admit: 2020-02-23 | Discharge: 2020-02-23 | Disposition: A | Discharge status: Home / Self Care | Payer: No Typology Code available for payment source | Source: Ambulatory Visit | Attending: Surgery | Admitting: Surgery

## 2020-02-23 ENCOUNTER — Other Ambulatory Visit: Payer: Self-pay

## 2020-02-23 ENCOUNTER — Ambulatory Visit (HOSPITAL_COMMUNITY)
Admission: RE | Admit: 2020-02-23 | Discharge: 2020-02-23 | Disposition: A | Discharge status: Home / Self Care | Payer: Self-pay | Source: Ambulatory Visit | Attending: Surgery | Admitting: Surgery

## 2020-02-23 ENCOUNTER — Other Ambulatory Visit: Payer: Self-pay | Admitting: Surgery

## 2020-02-23 ENCOUNTER — Encounter (HOSPITAL_COMMUNITY): Payer: Self-pay

## 2020-02-23 DIAGNOSIS — Z79899 Other long term (current) drug therapy: Secondary | ICD-10-CM | POA: Insufficient documentation

## 2020-02-23 DIAGNOSIS — Z170 Estrogen receptor positive status [ER+]: Secondary | ICD-10-CM

## 2020-02-23 DIAGNOSIS — I10 Essential (primary) hypertension: Secondary | ICD-10-CM | POA: Insufficient documentation

## 2020-02-23 DIAGNOSIS — Z7982 Long term (current) use of aspirin: Secondary | ICD-10-CM | POA: Insufficient documentation

## 2020-02-23 DIAGNOSIS — Z7984 Long term (current) use of oral hypoglycemic drugs: Secondary | ICD-10-CM | POA: Insufficient documentation

## 2020-02-23 DIAGNOSIS — C50411 Malignant neoplasm of upper-outer quadrant of right female breast: Secondary | ICD-10-CM | POA: Insufficient documentation

## 2020-02-23 DIAGNOSIS — E119 Type 2 diabetes mellitus without complications: Secondary | ICD-10-CM | POA: Insufficient documentation

## 2020-02-23 HISTORY — PX: IR IMAGING GUIDED PORT INSERTION: IMG5740

## 2020-02-23 LAB — GLUCOSE, CAPILLARY: Glucose-Capillary: 110 mg/dL — ABNORMAL HIGH (ref 70–99)

## 2020-02-23 IMAGING — US IR IMAGING GUIDED PORT INSERTION
2 series · 2 of 2 positions shown · non-contrast
Comparison: none

CLINICAL DATA: Right breast carcinoma and need for porta cath to
begin chemotherapy.

[Series 1: (id) · 1 of 1 slices shown]
[im 1/1]
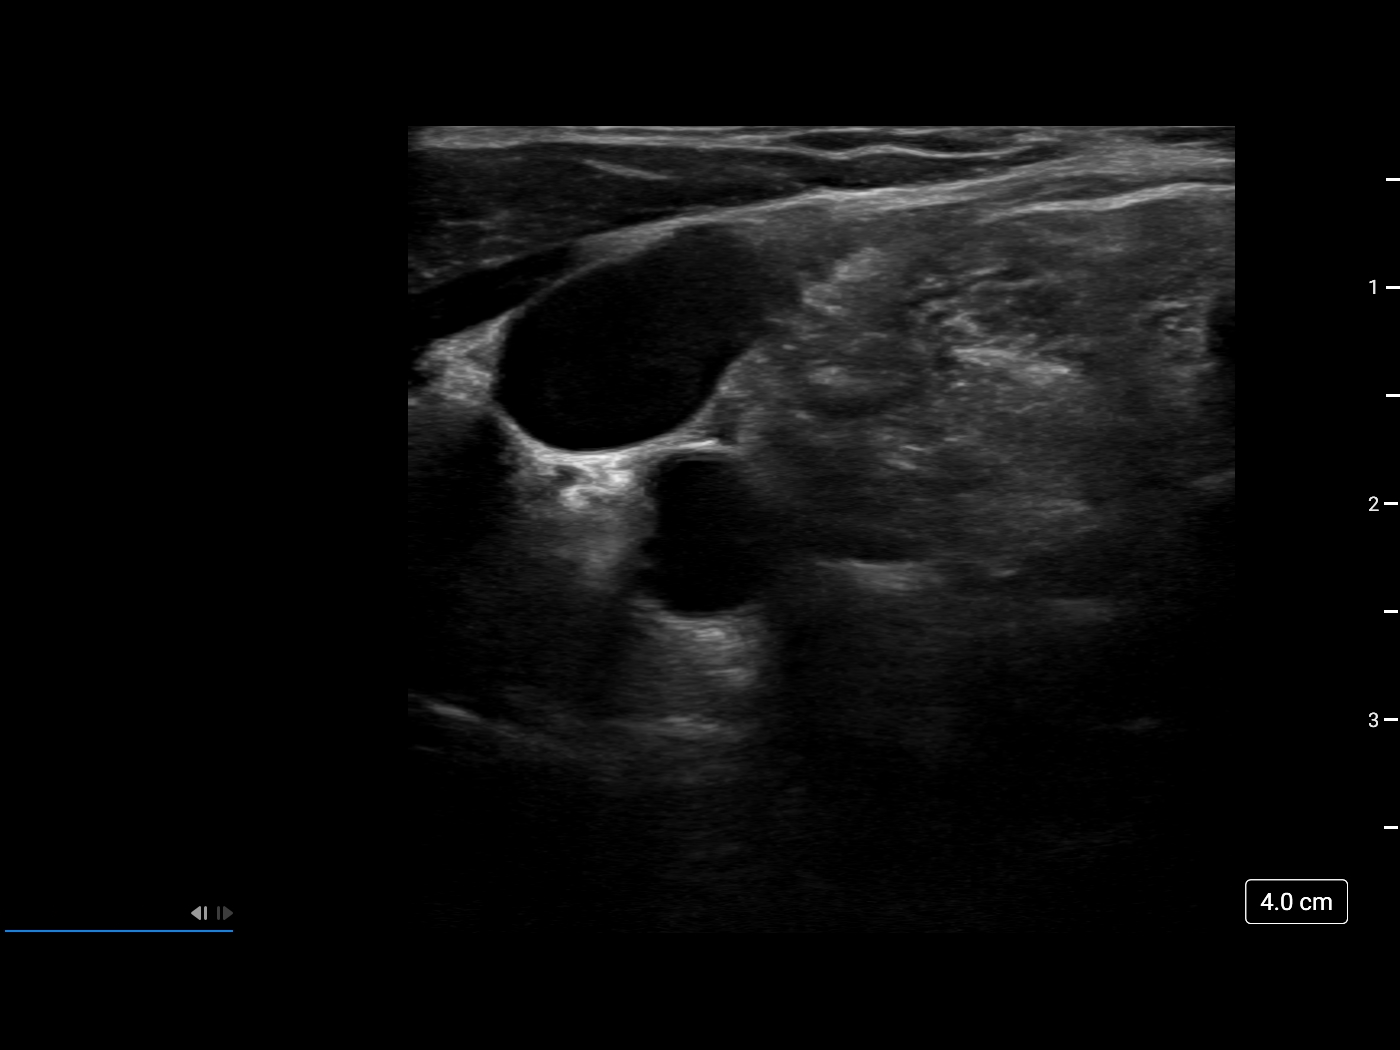

[Series 300: line placements · 1 of 1 slices shown]
[im 1/1]
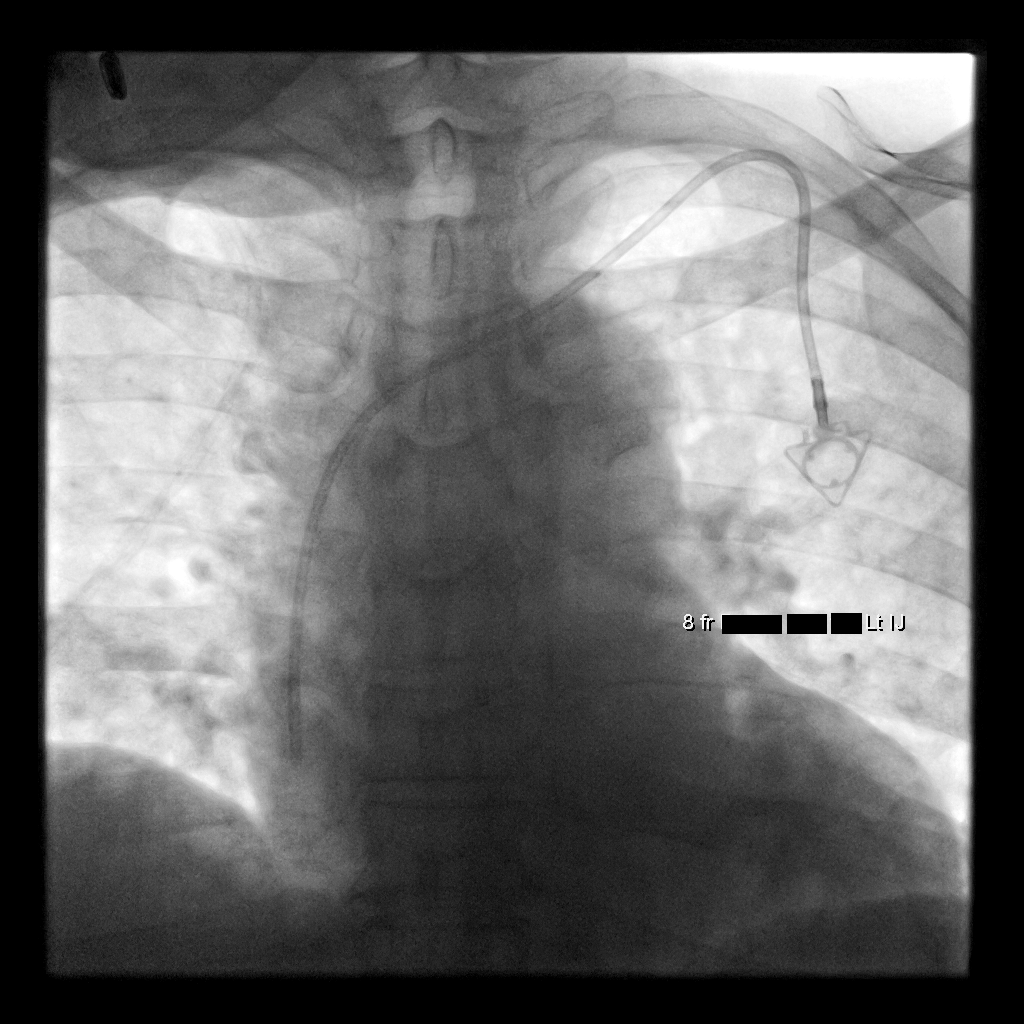

[2 of 2 positions shown; findings below may reference images not displayed]

EXAM:
IMPLANTED PORT A CATH PLACEMENT WITH ULTRASOUND AND FLUOROSCOPIC
GUIDANCE

ANESTHESIA/SEDATION:
4.0 mg IV Versed; 100 mcg IV Fentanyl

Total Moderate Sedation Time:  37 minutes

The patient's level of consciousness and physiologic status were
continuously monitored during the procedure by Radiology nursing.

Additional Medications: 2 g IV Ancef.

FLUOROSCOPY TIME:  30 seconds.  9.0 mGy.

PROCEDURE:
The procedure, risks, benefits, and alternatives were explained to
the patient. Questions regarding the procedure were encouraged and
answered. The patient understands and consents to the procedure. A
time-out was performed prior to initiating the procedure.

Ultrasound was utilized to confirm patency of the left internal
jugular vein. The left neck and chest were prepped with
chlorhexidine in a sterile fashion, and a sterile drape was applied
covering the operative field. Maximum barrier sterile technique with
sterile gowns and gloves were used for the procedure. Local
anesthesia was provided with 1% lidocaine.

After creating a small venotomy incision, a 21 gauge needle was
advanced into the left internal jugular vein under direct, real-time
ultrasound guidance. Ultrasound image documentation was performed.
After securing guidewire access, an 8 Fr dilator was placed. A
J-wire was kinked to measure appropriate catheter length.

A subcutaneous port pocket was then created along the upper chest
wall utilizing sharp and blunt dissection. Portable cautery was
utilized. The pocket was irrigated with sterile saline.

A single lumen power injectable port was chosen for placement. The 8
Fr catheter was tunneled from the port pocket site to the venotomy
incision. The port was placed in the pocket. External catheter was
trimmed to appropriate length based on guidewire measurement.

At the venotomy, an 8 Fr peel-away sheath was placed over a
guidewire. The catheter was then placed through the sheath and the
sheath removed. Final catheter positioning was confirmed and
documented with a fluoroscopic spot image. The port was accessed
with a needle and aspirated and flushed with heparinized saline. The
access needle was removed.

The venotomy and port pocket incisions were closed with subcutaneous
3-0 Monocryl and subcuticular 4-0 Vicryl. Dermabond was applied to
both incisions.

COMPLICATIONS:
COMPLICATIONS
None
FINDINGS: After catheter placement, the tip lies at the SAYRA junction.
The catheter aspirates normally and is ready for immediate use.
IMPRESSION: Placement of single lumen port a cath via left internal jugular
vein. The catheter tip lies at the SAYRA junction. A power
injectable port a cath was placed and is ready for immediate use.

## 2020-02-23 MED ORDER — FENTANYL CITRATE (PF) 100 MCG/2ML IJ SOLN
INTRAMUSCULAR | Status: AC | PRN
  Administered 2020-02-23 (×2): 50 ug via INTRAVENOUS

## 2020-02-23 MED ORDER — LIDOCAINE HCL (PF) 1 % IJ SOLN
INTRAMUSCULAR | Status: AC | PRN
  Administered 2020-02-23 (×2): 8 mL via INTRADERMAL

## 2020-02-23 MED ORDER — HEPARIN SOD (PORK) LOCK FLUSH 100 UNIT/ML IV SOLN
INTRAVENOUS | Status: AC
  Filled 2020-02-23: qty 5

## 2020-02-23 MED ORDER — HEPARIN SOD (PORK) LOCK FLUSH 100 UNIT/ML IV SOLN
INTRAVENOUS | Status: AC | PRN
  Administered 2020-02-23: 500 [IU] via INTRAVENOUS

## 2020-02-23 MED ORDER — CEFAZOLIN SODIUM-DEXTROSE 2-4 GM/100ML-% IV SOLN: 2.0000 g | Freq: Once | INTRAVENOUS | Status: AC

## 2020-02-23 MED ORDER — LIDOCAINE HCL 1 % IJ SOLN
INTRAMUSCULAR | Status: AC
  Filled 2020-02-23: qty 20

## 2020-02-23 MED ORDER — MIDAZOLAM HCL 2 MG/2ML IJ SOLN
INTRAMUSCULAR | Status: AC
  Filled 2020-02-23: qty 4

## 2020-02-23 MED ORDER — MIDAZOLAM HCL 2 MG/2ML IJ SOLN
INTRAMUSCULAR | Status: AC | PRN
  Administered 2020-02-23 (×4): 1 mg via INTRAVENOUS

## 2020-02-23 MED ORDER — FENTANYL CITRATE (PF) 100 MCG/2ML IJ SOLN
INTRAMUSCULAR | Status: AC
  Filled 2020-02-23: qty 2

## 2020-02-23 MED ORDER — CEFAZOLIN SODIUM-DEXTROSE 2-4 GM/100ML-% IV SOLN
INTRAVENOUS | Status: AC
  Administered 2020-02-23: 2 g via INTRAVENOUS
  Filled 2020-02-23: qty 100

## 2020-02-23 MED ORDER — SODIUM CHLORIDE 0.9 % IV SOLN: INTRAVENOUS | Status: DC

## 2020-02-23 NOTE — Discharge Instructions (Signed)
Implanted Port Insertion, Care After °This sheet gives you information about how to care for yourself after your procedure. Your health care provider may also give you more specific instructions. If you have problems or questions, contact your health care provider. °What can I expect after the procedure? °After the procedure, it is common to have: °· Discomfort at the port insertion site. °· Bruising on the skin over the port. This should improve over 3-4 days. °Follow these instructions at home: °Port care °· After your port is placed, you will get a manufacturer's information card. The card has information about your port. Keep this card with you at all times. °· Take care of the port as told by your health care provider. Ask your health care provider if you or a family member can get training for taking care of the port at home. A home health care nurse may also take care of the port. °· Make sure to remember what type of port you have. °Incision care ° °  ° °· Follow instructions from your health care provider about how to take care of your port insertion site. Make sure you: °? Wash your hands with soap and water before and after you change your bandage (dressing). If soap and water are not available, use hand sanitizer. °? Change your dressing as told by your health care provider. °? Leave stitches (sutures), skin glue, or adhesive strips in place. These skin closures may need to stay in place for 2 weeks or longer. If adhesive strip edges start to loosen and curl up, you may trim the loose edges. Do not remove adhesive strips completely unless your health care provider tells you to do that. °· Check your port insertion site every day for signs of infection. Check for: °? Redness, swelling, or pain. °? Fluid or blood. °? Warmth. °? Pus or a bad smell. °Activity °· Return to your normal activities as told by your health care provider. Ask your health care provider what activities are safe for you. °· Do not  lift anything that is heavier than 10 lb (4.5 kg), or the limit that you are told, until your health care provider says that it is safe. °General instructions °· Take over-the-counter and prescription medicines only as told by your health care provider. °· Do not take baths, swim, or use a hot tub until your health care provider approves. Ask your health care provider if you may take showers. You may only be allowed to take sponge baths. °· Do not drive for 24 hours if you were given a sedative during your procedure. °· Wear a medical alert bracelet in case of an emergency. This will tell any health care providers that you have a port. °· Keep all follow-up visits as told by your health care provider. This is important. °Contact a health care provider if: °· You cannot flush your port with saline as directed, or you cannot draw blood from the port. °· You have a fever or chills. °· You have redness, swelling, or pain around your port insertion site. °· You have fluid or blood coming from your port insertion site. °· Your port insertion site feels warm to the touch. °· You have pus or a bad smell coming from the port insertion site. °Get help right away if: °· You have chest pain or shortness of breath. °· You have bleeding from your port that you cannot control. °Summary °· Take care of the port as told by your health   care provider. Keep the manufacturer's information card with you at all times. °· Change your dressing as told by your health care provider. °· Contact a health care provider if you have a fever or chills or if you have redness, swelling, or pain around your port insertion site. °· Keep all follow-up visits as told by your health care provider. °This information is not intended to replace advice given to you by your health care provider. Make sure you discuss any questions you have with your health care provider. °Document Revised: 11/24/2017 Document Reviewed: 11/24/2017 °Elsevier Patient Education ©  2020 [employer] °Moderate Conscious Sedation, Adult, Care After °These instructions provide you with information about caring for yourself after your procedure. Your health care provider may also give you more specific instructions. Your treatment has been planned according to current medical practices, but problems sometimes occur. Call your health care provider if you have any problems or questions after your procedure. °What can I expect after the procedure? °After your procedure, it is common: °· To feel sleepy for several hours. °· To feel clumsy and have poor balance for several hours. °· To have poor judgment for several hours. °· To vomit if you eat too soon. °Follow these instructions at home: °For at least 24 hours after the procedure: ° °· Do not: °? Participate in activities where you could fall or become injured. °? Drive. °? Use heavy machinery. °? Drink alcohol. °? Take sleeping pills or medicines that cause drowsiness. °? Make important decisions or sign legal documents. °? Take care of children on your own. °· Rest. °Eating and drinking °· Follow the diet recommended by your health care provider. °· If you vomit: °? Drink water, juice, or soup when you can drink without vomiting. °? Make sure you have little or no nausea before eating solid foods. °General instructions °· Have a responsible adult stay with you until you are awake and alert. °· Take over-the-counter and prescription medicines only as told by your health care provider. °· If you smoke, do not smoke without supervision. °· Keep all follow-up visits as told by your health care provider. This is important. °Contact a health care provider if: °· You keep feeling nauseous or you keep vomiting. °· You feel light-headed. °· You develop a rash. °· You have a fever. °Get help right away if: °· You have trouble breathing. °This information is not intended to replace advice given to you by your health care provider. Make sure you discuss any  questions you have with your health care provider. °Document Revised: 04/10/2017 Document Reviewed: 08/18/2015 °Elsevier Patient Education © 2020 [employer] ° °

## 2020-02-23 NOTE — H&P (Signed)
Chief Complaint: Patient was seen in consultation today for breast cancer/Port-a-cath placement.  Referring Physician(s): Cornett,Thomas  Supervising Physician: Aletta Edouard  Patient Status: [facility] - Out-pt  History of Present Illness: [name] [name] is a 64 y.o. female with a past medical history of hypertension, diabetes mellitus, breast cancer, and arthritis. She was unfortunately diagnosed with breast cancer in 01/2020. Her cancer is managed by Dr. Gwenlyn Perking (medical oncology) and Dr. Brantley Stage (surgery). She has tentative plans to begin systemic chemotherapy as management.  IR consulted by Dr. Brantley Stage for possible image-guided Port-a-cath placement. Patient awake and alert laying in bed watching TV with no complaints at this time. Denies fever, chills, chest pain, dyspnea, abdominal pain, or headache.   Past Medical History:  Diagnosis Date  . Arthritis   . Diabetes mellitus without complication ([place])   . Hypertension     History reviewed. No pertinent surgical history.  Allergies: Sulfa antibiotics  Medications: Prior to Admission medications   Medication Sig Start Date End Date Taking? Authorizing Provider  amLODipine (NORVASC) 5 MG tablet Take 5 mg by mouth daily. 01/09/20  Yes [provider]  ASPIRIN 81 81 MG chewable tablet Chew 1 tablet by mouth daily. 12/05/19  Yes [provider]  atorvastatin (LIPITOR) 40 MG tablet Take 40 mg by mouth daily. 02/16/20  Yes [provider]  carvedilol (COREG) 6.25 MG tablet Take 6.25 mg by mouth 2 (two) times daily with a meal.   Yes [provider]  celecoxib (CELEBREX) 200 MG capsule Take 200 mg by mouth daily.    Yes [provider]  lansoprazole (PREVACID) 30 MG capsule Take 30 mg by mouth every morning. 11/30/19  Yes [provider]  losartan (COZAAR) 100 MG tablet Take 100 mg by mouth daily.   Yes [provider]  metFORMIN (GLUCOPHAGE) 1000 MG tablet Take 1,000 mg by  mouth daily with breakfast.    Yes [provider]  dexamethasone (DECADRON) 4 MG tablet Take 2 tablets (8 mg total) by mouth 2 (two) times daily. Start the day before Taxotere. Then again the day after chemo for 3 days. 02/15/20   Magrinat, Virgie Dad, MD  lidocaine-prilocaine (EMLA) cream Apply to affected area once 02/15/20   Magrinat, Virgie Dad, MD  LORazepam (ATIVAN) 0.5 MG tablet Take 1 tablet (0.5 mg total) by mouth at bedtime as needed (Nausea or vomiting). 02/15/20   Magrinat, Virgie Dad, MD  prochlorperazine (COMPAZINE) 10 MG tablet Take 1 tablet (10 mg total) by mouth 4 (four) times daily -  before meals and at bedtime. Start the evening of chemotherapy and continue for 2 days, then take as needed. 02/15/20   Magrinat, Virgie Dad, MD     Family History  Problem Relation Age of Onset  . Asthma Mother   . Arthritis Sister   . Asthma Brother   . Arthritis Brother     Social History   Socioeconomic History  . Marital status: Single    Spouse name: Not on file  . Number of children: 1  . Years of education: Not on file  . Highest education level: 9th grade  Occupational History  . Not on file  Tobacco Use  . Smoking status: Never Smoker  . Smokeless tobacco: Never Used  Vaping Use  . Vaping Use: Never used  Substance and Sexual Activity  . Alcohol use: Yes    Alcohol/week: 1.0 standard drink    Types: 1 Glasses of wine per week    Comment: occasionally  .  Drug use: Never  . Sexual activity: Not Currently  Other Topics Concern  . Not on file  Social History Narrative  . Not on file   Social Determinants of Health   Financial Resource Strain:   . Difficulty of Paying Living Expenses: Not on file  Food Insecurity:   . Worried About Charity fundraiser in the Last Year: Not on file  . Ran Out of Food in the Last Year: Not on file  Transportation Needs: No Transportation Needs  . Lack of Transportation (Medical): No  . Lack of Transportation (Non-Medical): No    Physical Activity:   . Days of Exercise per Week: Not on file  . Minutes of Exercise per Session: Not on file  Stress:   . Feeling of Stress : Not on file  Social Connections:   . Frequency of Communication with Friends and Family: Not on file  . Frequency of Social Gatherings with Friends and Family: Not on file  . Attends Religious Services: Not on file  . Active Member of Clubs or Organizations: Not on file  . Attends Archivist Meetings: Not on file  . Marital Status: Not on file     Review of Systems: A 12 point ROS discussed and pertinent positives are indicated in the HPI above.  All other systems are negative.  Review of Systems  Constitutional: Negative for chills and fever.  Respiratory: Negative for shortness of breath and wheezing.   Cardiovascular: Negative for chest pain and palpitations.  Gastrointestinal: Negative for abdominal pain.  Neurological: Negative for headaches.  Psychiatric/Behavioral: Negative for behavioral problems and confusion.    Vital Signs: BP (!) 161/89   Pulse 65   Temp 98.9 F (37.2 C) (Oral)   Resp 16   SpO2 100%   Physical Exam Vitals and nursing note reviewed.  Constitutional:      General: She is not in acute distress.    Appearance: Normal appearance.  Cardiovascular:     Rate and Rhythm: Normal rate and regular rhythm.     Heart sounds: Normal heart sounds. No murmur heard.   Pulmonary:     Effort: Pulmonary effort is normal. No respiratory distress.     Breath sounds: Normal breath sounds. No wheezing.  Skin:    General: Skin is warm and dry.  Neurological:     Mental Status: She is alert and oriented to person, place, and time.  Psychiatric:        Mood and Affect: Mood normal.        Behavior: Behavior normal.      MD Evaluation Airway: WNL Heart: WNL Abdomen: WNL Chest/ Lungs: WNL ASA  Classification: 3 Mallampati/Airway Score: Two   Imaging: US BREAST LTD UNI LEFT INC AXILLA  Result Date:  01/31/2020 CLINICAL DATA:  Patient presents for palpable mass within the right axilla. EXAM: DIGITAL DIAGNOSTIC BILATERAL MAMMOGRAM WITH CAD AND TOMO ULTRASOUND BILATERAL BREAST COMPARISON:  None. ACR Breast Density Category b: There are scattered areas of fibroglandular density. FINDINGS: Underlying the palpable marker within the right axilla are 2 adjacent enlarged lymph nodes. Within the anterior central right breast there are 2 adjacent irregular masses measuring 1.6 and 2.0 cm. Within the upper inner right breast posterior depth there is a persistent small lobular mass. Questioned asymmetry within the outer right breast appeared to resolve with additional imaging. Questioned asymmetry within the anterior left breast appeared to resolve with additional imaging. Mammographic images were processed with CAD. Targeted ultrasound is  performed, showing a 1.9 x 1.7 x 1.7 cm irregular hypoechoic right breast mass 12 o'clock position 1 cm from nipple. There is an adjacent 2.0 x 2.3 x 1.0 cm irregular hypoechoic mass right breast 11 o'clock position 3 cm from nipple. There is a 0.4 x 0.3 x 0.3 cm irregular hypoechoic mass right breast 1 o'clock position 8 cm from nipple. There are 2 adjacent enlarged thickened lymph nodes measuring up to 2.1 cm. No suspicious abnormality within the retroareolar left breast. Dense tissue is visualized. IMPRESSION: 1. There are 3 suspicious masses within the right breast concerning primary breast malignancy. 2. Two adjacent enlarged thickened right axillary lymph nodes concerning for metastatic adenopathy. RECOMMENDATION: Ultrasound-guided core needle biopsy of the 3 right breast masses and 1 of the thickened right axillary lymph nodes. Patient will be scheduled for 2 separate days. On the first biopsy day, recommend biopsy of the right breast mass 12 o'clock position and 1 of the thickened right axillary lymph nodes. On the second biopsy day, recommend biopsy of the right breast masses 11  o'clock position and 1 o'clock position. I have discussed the findings and recommendations with the patient. If applicable, a reminder letter will be sent to the patient regarding the next appointment. BI-RADS CATEGORY  5: Highly suggestive of malignancy. Electronically Signed   By: Lovey Newcomer M.D.   On: 01/31/2020 13:37   US BREAST LTD UNI RIGHT INC AXILLA  Result Date: 01/31/2020 CLINICAL DATA:  Patient presents for palpable mass within the right axilla. EXAM: DIGITAL DIAGNOSTIC BILATERAL MAMMOGRAM WITH CAD AND TOMO ULTRASOUND BILATERAL BREAST COMPARISON:  None. ACR Breast Density Category b: There are scattered areas of fibroglandular density. FINDINGS: Underlying the palpable marker within the right axilla are 2 adjacent enlarged lymph nodes. Within the anterior central right breast there are 2 adjacent irregular masses measuring 1.6 and 2.0 cm. Within the upper inner right breast posterior depth there is a persistent small lobular mass. Questioned asymmetry within the outer right breast appeared to resolve with additional imaging. Questioned asymmetry within the anterior left breast appeared to resolve with additional imaging. Mammographic images were processed with CAD. Targeted ultrasound is performed, showing a 1.9 x 1.7 x 1.7 cm irregular hypoechoic right breast mass 12 o'clock position 1 cm from nipple. There is an adjacent 2.0 x 2.3 x 1.0 cm irregular hypoechoic mass right breast 11 o'clock position 3 cm from nipple. There is a 0.4 x 0.3 x 0.3 cm irregular hypoechoic mass right breast 1 o'clock position 8 cm from nipple. There are 2 adjacent enlarged thickened lymph nodes measuring up to 2.1 cm. No suspicious abnormality within the retroareolar left breast. Dense tissue is visualized. IMPRESSION: 1. There are 3 suspicious masses within the right breast concerning primary breast malignancy. 2. Two adjacent enlarged thickened right axillary lymph nodes concerning for metastatic adenopathy.  RECOMMENDATION: Ultrasound-guided core needle biopsy of the 3 right breast masses and 1 of the thickened right axillary lymph nodes. Patient will be scheduled for 2 separate days. On the first biopsy day, recommend biopsy of the right breast mass 12 o'clock position and 1 of the thickened right axillary lymph nodes. On the second biopsy day, recommend biopsy of the right breast masses 11 o'clock position and 1 o'clock position. I have discussed the findings and recommendations with the patient. If applicable, a reminder letter will be sent to the patient regarding the next appointment. BI-RADS CATEGORY  5: Highly suggestive of malignancy. Electronically Signed   By: Lovey Newcomer  M.D.   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 This was found to be concordant by Dr. Lillia Mountain. Pathology revealed METASTATIC CARCINOMA INVOLVING A LYMPH NODE of the RIGHT axilla. This was found to be concordant by Dr. Lillia Mountain. Pathology results were discussed with the patient by telephone. The patient reported doing well after the biopsies with tenderness at the sites. Post biopsy instructions and care were reviewed and questions were answered. The patient was encouraged to call [facility] for any additional concerns. 2 additional ultrasound guided biopsies of RIGHT breast are scheduled for 02/06/2020. Surgical consultation has been arranged with Dr. Erroll Luna at [facility] on February 13, 2020. Pathology results reported by Stacie Acres RN on 02/08/2020. Electronically Signed   By: Lillia Mountain M.D.   On: 02/08/2020 08:35   Result Date: 02/08/2020 CLINICAL DATA:  Suspicious mass in the 12 o'clock region of the right breast and suspicious right axillary lymph node. EXAM: ULTRASOUND GUIDED RIGHT BREAST CORE NEEDLE  BIOPSIES COMPARISON:  Previous exam(s). PROCEDURE: I met with the patient and we discussed the procedure of ultrasound-guided biopsy, including benefits and alternatives. We discussed the high likelihood of a successful procedure. We discussed the risks of the procedure, including infection, bleeding, tissue injury, clip migration, and inadequate sampling. Informed written consent was given. The usual time-out protocol was performed immediately prior to the procedure. Lesion quadrant: 12 o'clock Using sterile technique and 1% lidocaine and 1% lidocaine with epinephrine as local anesthetic, under direct ultrasound visualization, a 14 gauge spring-loaded device was used to perform biopsy of a mass in the 12 o'clock region of the right breast using a lateral to medial approach. At the conclusion of the procedure a ribbon shaped tissue marker clip was deployed into the biopsy cavity. Follow up 2 view mammogram was performed and dictated separately. I met with the patient and we discussed the procedure of ultrasound-guided biopsy, including benefits and alternatives. We discussed the high likelihood of a successful procedure. We discussed the risks of the procedure, including infection, bleeding, tissue injury, clip migration, and inadequate sampling. Informed written consent was given. The usual time-out protocol was performed immediately prior to the procedure. Lesion quadrant: Right axilla Using sterile technique and 1% lidocaine and 1% lidocaine with epinephrine as local anesthetic, under direct ultrasound visualization, a 14 gauge spring-loaded device was used to perform biopsy of a right axillary lymph node using a lateral to medial approach. At the conclusion of the procedure [facility] tissue marker clip was deployed into the biopsy cavity. Follow up 2 view mammogram was performed and dictated separately. IMPRESSION: Ultrasound guided biopsies of a mass in the 12 o'clock region of the right breast and a right  axillary lymph node. No apparent complications. Electronically Signed: By: Lillia Mountain M.D. On: 02/03/2020 14:46   MS DIGITAL DIAG TOMO BILAT  Result Date: 01/31/2020 CLINICAL DATA:  Patient presents for palpable mass within the right axilla. EXAM: DIGITAL DIAGNOSTIC BILATERAL MAMMOGRAM WITH CAD AND TOMO ULTRASOUND BILATERAL BREAST COMPARISON:  None. ACR Breast Density Category b: There are scattered areas of fibroglandular density. FINDINGS: Underlying the palpable marker within the right axilla are 2 adjacent enlarged lymph nodes. Within the anterior central right breast there are 2 adjacent irregular masses measuring 1.6 and 2.0 cm. Within the upper inner right breast  posterior depth there is a persistent small lobular mass. Questioned asymmetry within the outer right breast appeared to resolve with additional imaging. Questioned asymmetry within the anterior left breast appeared to resolve with additional imaging. Mammographic images were processed with CAD. Targeted ultrasound is performed, showing a 1.9 x 1.7 x 1.7 cm irregular hypoechoic right breast mass 12 o'clock position 1 cm from nipple. There is an adjacent 2.0 x 2.3 x 1.0 cm irregular hypoechoic mass right breast 11 o'clock position 3 cm from nipple. There is a 0.4 x 0.3 x 0.3 cm irregular hypoechoic mass right breast 1 o'clock position 8 cm from nipple. There are 2 adjacent enlarged thickened lymph nodes measuring up to 2.1 cm. No suspicious abnormality within the retroareolar left breast. Dense tissue is visualized. IMPRESSION: 1. There are 3 suspicious masses within the right breast concerning primary breast malignancy. 2. Two adjacent enlarged thickened right axillary lymph nodes concerning for metastatic adenopathy. RECOMMENDATION: Ultrasound-guided core needle biopsy of the 3 right breast masses and 1 of the thickened right axillary lymph nodes. Patient will be scheduled for 2 separate days. On the first biopsy day, recommend biopsy of the  right breast mass 12 o'clock position and 1 of the thickened right axillary lymph nodes. On the second biopsy day, recommend biopsy of the right breast masses 11 o'clock position and 1 o'clock position. I have discussed the findings and recommendations with the patient. If applicable, a reminder letter will be sent to the patient regarding the next appointment. BI-RADS CATEGORY  5: Highly suggestive of malignancy. Electronically Signed   By: Lovey Newcomer M.D.   On: 01/31/2020 13:37   MM CLIP PLACEMENT RIGHT  Result Date: 02/06/2020 CLINICAL DATA:  Post biopsy mammogram of the right breast for clip placement. EXAM: DIAGNOSTIC RIGHT MAMMOGRAM POST ULTRASOUND BIOPSY COMPARISON:  Previous exam(s). FINDINGS: Mammographic images were obtained following ultrasound guided biopsy of 2 masses in the right breast. The biopsy marking clips in expected position at the sites of biopsy. IMPRESSION: 1. Appropriate positioning of the coil shaped biopsy marking clip at the site of biopsy in the upper-outer right breast (11 o'clock). 2. Appropriate positioning of the heart shaped biopsy marking clip in the upper inner right breast (1 o'clock). Final Assessment: Post Procedure Mammograms for Marker Placement Electronically Signed   By: Ammie Ferrier M.D.   On: 02/06/2020 10:34   MM CLIP PLACEMENT RIGHT  Result Date: 02/03/2020 CLINICAL DATA:  Status post ultrasound-guided core biopsies of a mass in the 12 o'clock region of the right breast and a right axillary lymph node. EXAM: 3D DIAGNOSTIC right MAMMOGRAM POST ULTRASOUND BIOPSIES COMPARISON:  Previous exam(s). FINDINGS: 3D Mammographic images were obtained following ultrasound guided core biopsies of a mass in the 12 o'clock region of the right breast and a right axillary lymph node. Mammographic images show there is a ribbon shaped clip in the mass in the 12 o'clock region of the right breast. The HydroMARK clip in the right axilla is not visualized secondary to the  posterior location of the lymph node. IMPRESSION: Appropriate positioning of the ribbon shaped clip in the 12 o'clock region of the right breast. The HydroMARK clip was not visualized secondary to the posterior location of the lymph node. Final Assessment: Post Procedure Mammograms for Marker Placement Electronically Signed   By: Lillia Mountain M.D.   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 This was found to be concordant by Dr. Lillia Mountain. Pathology revealed METASTATIC CARCINOMA INVOLVING A LYMPH NODE of the RIGHT axilla. This was found to be concordant by Dr. Lillia Mountain. Pathology results were discussed with the patient by telephone. The patient reported doing well after the biopsies with tenderness at the sites. Post biopsy instructions and care were reviewed and questions were answered. The patient was encouraged to call [facility] for any additional concerns. 2 additional ultrasound guided biopsies of RIGHT breast are scheduled for 02/06/2020. Surgical consultation has been arranged with Dr. Erroll Luna at [facility] on February 13, 2020. Pathology results reported by Stacie Acres RN on 02/08/2020. Electronically Signed   By: Lillia Mountain M.D.   On: 02/08/2020 08:35   Result Date: 02/08/2020 CLINICAL DATA:  Suspicious mass in the 12 o'clock region of the right breast and suspicious right axillary lymph node. EXAM: ULTRASOUND GUIDED RIGHT BREAST CORE NEEDLE BIOPSIES COMPARISON:  Previous exam(s). PROCEDURE: I met with the patient and we discussed the procedure of ultrasound-guided biopsy, including benefits and alternatives. We discussed the high likelihood of a successful procedure. We discussed the risks of the procedure, including infection, bleeding, tissue injury,  clip migration, and inadequate sampling. Informed written consent was given. The usual time-out protocol was performed immediately prior to the procedure. Lesion quadrant: 12 o'clock Using sterile technique and 1% lidocaine and 1% lidocaine with epinephrine as local anesthetic, under direct ultrasound visualization, a 14 gauge spring-loaded device was used to perform biopsy of a mass in the 12 o'clock region of the right breast using a lateral to medial approach. At the conclusion of the procedure a ribbon shaped tissue marker clip was deployed into the biopsy cavity. Follow up 2 view mammogram was performed and dictated separately. I met with the patient and we discussed the procedure of ultrasound-guided biopsy, including benefits and alternatives. We discussed the high likelihood of a successful procedure. We discussed the risks of the procedure, including infection, bleeding, tissue injury, clip migration, and inadequate sampling. Informed written consent was given. The usual time-out protocol was performed immediately prior to the procedure. Lesion quadrant: Right axilla Using sterile technique and 1% lidocaine and 1% lidocaine with epinephrine as local anesthetic, under direct ultrasound visualization, a 14 gauge spring-loaded device was used to perform biopsy of a right axillary lymph node using a lateral to medial approach. At the conclusion of the procedure [facility] tissue marker clip was deployed into the biopsy cavity. Follow up 2 view mammogram was performed and dictated separately. IMPRESSION: Ultrasound guided biopsies of a mass in the 12 o'clock region of the right breast and a right axillary lymph node. No apparent complications. Electronically Signed: By: Lillia Mountain M.D. On: 02/03/2020 14:46   [place] RT BREAST BX W LOC DEV 1ST LESION IMG BX SPEC US GUIDE  Addendum Date: 02/07/2020   ADDENDUM REPORT: 02/07/2020 15:00 ADDENDUM: Pathology revealed GRADE II INVASIVE DUCTAL CARCINOMA of the RIGHT  breast, 11 o'clock mass. This was found to be concordant by Dr. Ammie Ferrier. 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 This was found to be concordant by Dr. Ammie Ferrier. Pathology results were discussed with the patient and her daughter by telephone. The patient reported doing well after the biopsies with tenderness at the sites. Post biopsy instructions and care were reviewed and questions were answered. The  patient was encouraged to call [facility] for any additional concerns. Surgical consultation has been arranged with Dr. Erroll Luna at [facility] on February 13, 2020. Pathology results reported by Stacie Acres RN on 02/07/2020. Electronically Signed   By: Ammie Ferrier M.D.   On: 02/07/2020 15:00   Result Date: 02/07/2020 CLINICAL DATA:  75 year presenting for ultrasound-guided biopsy of 2 right breast masses. EXAM: ULTRASOUND GUIDED RIGHT BREAST CORE NEEDLE BIOPSY COMPARISON:  Previous exam(s). PROCEDURE: I met with the patient and we discussed the procedure of ultrasound-guided biopsy, including benefits and alternatives. We discussed the high likelihood of a successful procedure. We discussed the risks of the procedure, including infection, bleeding, tissue injury, clip migration, and inadequate sampling. Informed written consent was given. The usual time-out protocol was performed immediately prior to the procedure. Lesion quadrant: Upper outer quadrant Using sterile technique and 1% Lidocaine as local anesthetic, under direct ultrasound visualization, a 14 gauge spring-loaded device was used to perform biopsy of a mass in right breast at 11 o'clock using an inferior approach. At the conclusion of the procedure a coil shaped tissue marker clip was deployed into the biopsy cavity. -------------------------------------------------- Lesion quadrant: Upper inner quadrant Using sterile technique and 1% Lidocaine  as local anesthetic, under direct ultrasound visualization, a 14 gauge spring-loaded device was used to perform biopsy of a mass in the right breast at 1 o'clock using an inferolateral approach. At the conclusion of the procedure a heart shaped tissue marker clip was deployed into the biopsy cavity. Follow up 2 view mammogram was performed and dictated separately. IMPRESSION: 1. Ultrasound guided biopsy of a right breast mass at 11 o'clock. No apparent complications. 2. Ultrasound-guided biopsy of a right breast mass at 1 o'clock. No apparent complications. Electronically Signed: By: Ammie Ferrier M.D. On: 02/06/2020 10:26   [place] RT BREAST BX W LOC DEV EA ADD LESION IMG BX SPEC US GUIDE  Addendum Date: 02/07/2020   ADDENDUM REPORT: 02/07/2020 15:00 ADDENDUM: Pathology revealed GRADE II INVASIVE DUCTAL CARCINOMA of the RIGHT breast, 11 o'clock mass. This was found to be concordant by Dr. Ammie Ferrier. 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 This was found to be concordant by Dr. Ammie Ferrier. Pathology results were discussed with the patient and her daughter by telephone. The patient reported doing well after the biopsies with tenderness at the sites. Post biopsy instructions and care were reviewed and questions were answered. The patient was encouraged to call [facility] for any additional concerns. Surgical consultation has been arranged with Dr. Erroll Luna at [facility] on February 13, 2020. Pathology results reported by Stacie Acres RN on 02/07/2020. Electronically Signed   By: Ammie Ferrier M.D.   On: 02/07/2020 15:00   Result Date: 02/07/2020 CLINICAL DATA:  16 year presenting for ultrasound-guided biopsy of 2 right breast masses. EXAM: ULTRASOUND GUIDED RIGHT BREAST CORE NEEDLE BIOPSY COMPARISON:  Previous exam(s). PROCEDURE: I met with the patient and we discussed the procedure of ultrasound-guided biopsy,  including benefits and alternatives. We discussed the high likelihood of a successful procedure. We discussed the risks of the procedure, including infection, bleeding, tissue injury, clip migration, and inadequate sampling. Informed written consent was given. The usual time-out protocol was performed immediately prior to the procedure. Lesion quadrant: Upper outer quadrant Using sterile technique and 1% Lidocaine as local anesthetic, under direct ultrasound visualization, a 14 gauge spring-loaded device was used to  perform biopsy of a mass in right breast at 11 o'clock using an inferior approach. At the conclusion of the procedure a coil shaped tissue marker clip was deployed into the biopsy cavity. -------------------------------------------------- Lesion quadrant: Upper inner quadrant Using sterile technique and 1% Lidocaine as local anesthetic, under direct ultrasound visualization, a 14 gauge spring-loaded device was used to perform biopsy of a mass in the right breast at 1 o'clock using an inferolateral approach. At the conclusion of the procedure a heart shaped tissue marker clip was deployed into the biopsy cavity. Follow up 2 view mammogram was performed and dictated separately. IMPRESSION: 1. Ultrasound guided biopsy of a right breast mass at 11 o'clock. No apparent complications. 2. Ultrasound-guided biopsy of a right breast mass at 1 o'clock. No apparent complications. Electronically Signed: By: Ammie Ferrier M.D. On: 02/06/2020 10:26    Labs:  CBC: Recent Labs    02/15/20 1552  WBC 6.3  HGB 12.2  HCT 38.2  PLT 252    COAGS: No results for input(s): INR, APTT in the last 8760 hours.  BMP: Recent Labs    02/15/20 1552  NA 140  K 4.0  CL 105  CO2 30  GLUCOSE 132*  BUN 9  CALCIUM 9.5  CREATININE 0.75  GFRNONAA >60    LIVER FUNCTION TESTS: Recent Labs    02/15/20 1552  BILITOT 0.5  AST 23  ALT 38  ALKPHOS 81  PROT 7.1  ALBUMIN 4.0     Assessment and  Plan:  Breast cancer with tentative plans to begin systemic chemotherapy as management. Plan for image-guided Port-a-cath placement today in IR. Patient is NPO. Afebrile. She does not take blood thinners.  Risks and benefits of image-guided Port-a-catheter placement were discussed with the patient including, but not limited to bleeding, infection, pneumothorax, or fibrin sheath development and need for additional procedures. All of the patient's questions were answered, patient is agreeable to proceed. Consent signed and in chart.   Thank you for this interesting consult.  I greatly enjoyed meeting Dorethy Tomey and look forward to participating in their care.  A copy of this report was sent to the requesting provider on this date.  Electronically Signed: Earley Abide, PA-C 02/23/2020, 9:03 AM   I spent a total of 30 Minutes in face to face in clinical consultation, greater than 50% of which was counseling/coordinating care for breast cancer/Port-a-cath placement.

## 2020-02-23 NOTE — Procedures (Signed)
Interventional Radiology Procedure Note  Procedure: Single Lumen Power Port Placement    Access:  Left IJ vein.  Findings: Catheter tip positioned at SVC/RA junction. Port is ready for immediate use.   Complications: None  EBL: < 10 mL  Recommendations:  - Ok to shower in 24 hours - Do not submerge for 7 days - Routine line care   Keyondre Hepburn T. Braeley Buskey, M.D Pager:  [phone]   

## 2020-02-24 ENCOUNTER — Other Ambulatory Visit (HOSPITAL_COMMUNITY): Payer: No Typology Code available for payment source

## 2020-02-27 ENCOUNTER — Other Ambulatory Visit: Payer: Self-pay

## 2020-02-27 ENCOUNTER — Inpatient Hospital Stay: Payer: No Typology Code available for payment source

## 2020-02-27 ENCOUNTER — Encounter: Payer: Self-pay | Admitting: Oncology

## 2020-02-27 NOTE — Progress Notes (Signed)
Met with patient at registration to introduce myself as Arboriculturist and to offer available resources.  Discussed one-time $1000 Radio broadcast assistant to assist with personal expenses while going though treatment.  Gave her my card if interested in applying and for any additional financial questions or concerns.

## 2020-03-01 ENCOUNTER — Telehealth: Payer: Self-pay | Admitting: *Deleted

## 2020-03-01 NOTE — Progress Notes (Signed)
Pharmacist Chemotherapy Monitoring - Initial Assessment    Anticipated start date: 03/05/2020   Regimen:  . Are orders appropriate based on the patient's diagnosis, regimen, and cycle? Yes . Does the plan date match the patient's scheduled date? Yes . Is the sequencing of drugs appropriate? Yes . Are the premedications appropriate for the patient's regimen? Yes . Prior Authorization for treatment is: Pending o If applicable, is the correct biosimilar selected based on the patient's insurance? not applicable  Organ Function and Labs: Marland Kitchen Are dose adjustments needed based on the patient's renal function, hepatic function, or hematologic function? Yes . Are appropriate labs ordered prior to the start of patient's treatment? Yes . Other organ system assessment, if indicated: N/A . The following baseline labs, if indicated, have been ordered: N/A  Dose Assessment: . Are the drug doses appropriate? Yes . Are the following correct: o Drug concentrations Yes o IV fluid compatible with drug Yes o Administration routes Yes o Timing of therapy Yes . If applicable, does the patient have documented access for treatment and/or plans for port-a-cath placement? yes . If applicable, have lifetime cumulative doses been properly documented and assessed? not applicable Lifetime Dose Tracking  No doses have been documented on this patient for the following tracked chemicals: Doxorubicin, Epirubicin, Idarubicin, Daunorubicin, Mitoxantrone, Bleomycin, Oxaliplatin, Carboplatin, Liposomal Doxorubicin  o   Toxicity Monitoring/Prevention: . The patient has the following take home antiemetics prescribed: Prochlorperazine and Dexamethasone . The patient has the following take home medications prescribed: N/A . Medication allergies and previous infusion related reactions, if applicable, have been reviewed and addressed. No . The patient's current medication list has been assessed for drug-drug interactions with  their chemotherapy regimen. no significant drug-drug interactions were identified on review.  Order Review: . Are the treatment plan orders signed? No . Is the patient scheduled to see a provider prior to their treatment? Yes  I verify that I have reviewed each item in the above checklist and answered each question accordingly.  Sameka Bagent D 03/01/2020 3:59 PM

## 2020-03-01 NOTE — Telephone Encounter (Signed)
Left vm informing dexamethasone is being fill at confirmed pharmacy and ready for pick up. Contact information provided for further questions or needs.

## 2020-03-02 ENCOUNTER — Other Ambulatory Visit: Payer: Self-pay | Admitting: *Deleted

## 2020-03-02 DIAGNOSIS — C50411 Malignant neoplasm of upper-outer quadrant of right female breast: Secondary | ICD-10-CM

## 2020-03-02 NOTE — Progress Notes (Signed)
The following Medication is approved for drug replacement program by [employer] for [employer] . The enrollment period is from 02/29/2020 to 03/02/2021. Reason for Assistance: Self Pay. ID: [id number] First DOS covered is 03/07/2020.

## 2020-03-04 NOTE — Progress Notes (Signed)
[place]  [phone] [phone]     ID: [name] [name] DOB: 1955-12-09  MR#: [id number]  VPX#:[id number]  Patient Care Team: Patient, No Pcp Per as PCP - General (General Practice) Mauro Kaufmann, RN as Oncology Nurse Navigator Rockwell Germany, RN as Oncology Nurse Navigator Magaline Steinberg, Virgie Dad, MD as Consulting Physician (Oncology) Erroll Luna, MD as Consulting Physician (General Surgery) Eppie Gibson, MD as Attending Physician (Radiation Oncology) Chauncey Cruel, MD OTHER MD:  CHIEF COMPLAINT: Estrogen receptor positive breast cancer  CURRENT TREATMENT: Neoadjuvant chemotherapy   INTERVAL HISTORY: [name] [name] returns today for follow up of her estrogen receptor positive breast cancer. She was evaluated in the breast cancer clinic on 02/15/2020.  She underwent port placement on 02/23/2020 in anticipation of beginning neoadjuvant chemotherapy today.   Her chemotherapy will consist of cyclophosphamide and docetaxel every 21 days for 4 cycles.  She met with the chemo teaching nurse and received a root map of how to take her medications.  She tells me she took 1 tablet of Decadron yesterday morning and then realized her mistake and took 2 in the evening.  She slept okay.  She had no side effects from that medication  She also met with Dr. Isidore Moos on 02/22/2020 to discuss adjuvant radiation therapy. The plan is for radiation therapy to take place following chemotherapy and definitive surgery.   REVIEW OF SYSTEMS: [name] [name] is appropriately concerned and a little bit anxious since she has not had chemo before but otherwise a detailed review of systems today was stable.  She does not check her blood sugar regularly at home and this is discussed below.  A detailed review of systems was otherwise noncontributory   HISTORY OF CURRENT ILLNESS: From the original intake note:  [name] [name] herself palpated a mass in the right axilla "a long time ago", more  recently with complaints of associated pain. She brought it to medical attention and underwent bilateral diagnostic mammography with tomography and bilateral breast ultrasonography at [facility] on 01/31/2020 showing: breast density category B; three right breast masses-- 1.9 cm at 12 o'clock, 2.3 cm at 11 o'clock, 0.4 cm at 1 o'clock; two adjacent enlarged thickened lymph nodes measuring up to 2.1 cm; no evidence of left breast malignancy.  Accordingly on 02/03/2020 she proceeded to biopsy of the right breast area at 12 o'clock and right axilla. The pathology from this procedure ([id number]) showed: invasive ductal carcinoma, grade 3. Prognostic indicators significant for: estrogen receptor, 100% positive with strong staining intensity and progesterone receptor, 0% negative. Proliferation marker Ki67 at 60%. HER2 equivocal by immunohistochemistry (2+), but negative by fluorescent in situ hybridization with a signals ratio 0.81 and number per cell 2.7.  The biopsied lymph node was found to show metastatic carcinoma. The morphology was considered different from the biopsied mass, and a prognostic panel was performed. Estrogen receptor, 100% positive with strong staining intensity and progesterone receptor, 0% negative. Proliferation marker Ki67 at 70%. HER2 equivcoal by immunohistochemistry (2+), but negative) by fluorescent in situ hybridization with a signals ratio 1.16 and number per cell 3.9.  She underwent additional right breast biopsies on 02/06/2020. Pathology [id number]) from the mass at 11 o'clock showed: invasive ductal carcinoma, grade 2. Prognostic indicators significant for: estrogen receptor, 100% positive with strong staining intensity and progesterone receptor, 0% negative. Proliferation marker Ki67 at 15%. HER2 equivocal by immunohistochemistry (2+), but negative by fluorescent in situ hybridization with a signals ratio 1.29 and number per cell 2.7.  The  biopsied mass at 1 o'clock  showed only fibrocystic change.  The patient's subsequent history is as detailed below.   PAST MEDICAL HISTORY: Past Medical History:  Diagnosis Date   Arthritis    Diabetes mellitus without complication ([place])    Hypertension     PAST SURGICAL HISTORY: Past Surgical History:  Procedure Laterality Date   IR IMAGING GUIDED PORT INSERTION  02/23/2020    FAMILY HISTORY: Family History  Problem Relation Age of Onset   Asthma Mother    Arthritis Sister    Asthma Brother    Arthritis Brother   [name] died in his 15s from causes unknown to the patient.  [name] died from heart disease at age 67.  The patient had 4 brothers, 1 sister, with no history of cancer in the family to her knowledge   GYNECOLOGIC HISTORY:  No LMP recorded. Patient is postmenopausal. Menarche: 64 years old Age at first live birth: 64 years old [place] P 1 LMP 46s Contraceptive HRT no  Hysterectomy? no BSO? no   SOCIAL HISTORY: (updated 02/2020)  [name] [name] is originally from [place].  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  She describes herself as single.  At home she lives with her daughter [name] [name] who works for [employer], her daughters husband [name] who is a English as a second language teacher, and their children who are 31 months old and 59 years old.  The patient is in [place]: Not in place.  The patient tells me she would intend to name her daughter [name] [name] as her healthcare power of attorney   HEALTH MAINTENANCE: Social History   Tobacco Use   Smoking status: Never Smoker   Smokeless tobacco: Never Used  Vaping Use   Vaping Use: Never used  Substance Use Topics   Alcohol use: Yes    Alcohol/week: 1.0 standard drink    Types: 1 Glasses of wine per week    Comment: occasionally   Drug use: Never     Colonoscopy: never done  PAP: 2017 (performed in [place])  Bone density:    Allergies  Allergen Reactions   Sulfa  Antibiotics Itching    Current Outpatient Medications  Medication Sig Dispense Refill   amLODipine (NORVASC) 5 MG tablet Take 5 mg by mouth daily.     ASPIRIN 81 81 MG chewable tablet Chew 1 tablet by mouth daily.     atorvastatin (LIPITOR) 40 MG tablet Take 40 mg by mouth daily.     carvedilol (COREG) 6.25 MG tablet Take 6.25 mg by mouth 2 (two) times daily with a meal.     celecoxib (CELEBREX) 200 MG capsule Take 200 mg by mouth daily.      dexamethasone (DECADRON) 4 MG tablet Take 2 tablets (8 mg total) by mouth 2 (two) times daily. Start the day before Taxotere. Then again the day after chemo for 3 days. 30 tablet 1   lansoprazole (PREVACID) 30 MG capsule Take 30 mg by mouth every morning.     lidocaine-prilocaine (EMLA) cream Apply to affected area once 30 g 3   LORazepam (ATIVAN) 0.5 MG tablet Take 1 tablet (0.5 mg total) by mouth at bedtime as needed (Nausea or vomiting). 30 tablet 0   losartan (COZAAR) 100 MG tablet Take 100 mg by mouth daily.     metFORMIN (GLUCOPHAGE) 1000 MG tablet Take 1,000 mg by mouth daily with breakfast.      prochlorperazine (COMPAZINE) 10 MG  tablet Take 1 tablet (10 mg total) by mouth 4 (four) times daily -  before meals and at bedtime. Start the evening of chemotherapy and continue for 2 days, then take as needed. 30 tablet 1   No current facility-administered medications for this visit.    OBJECTIVE: African-American woman who appears older than stated age  65:   03/05/20 0902  BP: (!) 141/76  Pulse: 79  Resp: 18  Temp: 97.9 F (36.6 C)  SpO2: 100%     Body mass index is 35.48 kg/m.   Wt Readings from Last 3 Encounters:  03/05/20 219 lb 12.8 oz (99.7 kg)  02/15/20 218 lb 4.8 oz (99 kg)  01/24/20 218 lb 3.2 oz (99 kg)      ECOG FS:2 - Symptomatic, <50% confined to bed  Sclerae unicteric, EOMs intact Wearing a mask No cervical or supraclavicular adenopathy Lungs no rales or rhonchi Heart regular rate and rhythm Abd soft,  nontender, positive bowel sounds MSK no focal spinal tenderness, no upper extremity lymphedema Neuro: nonfocal, well oriented, appropriate affect Breasts: The mass in the right breast is perhaps 3 cm by palpation, movable, and does not involve the skin or nipple.  The left breast is benign and both axillae are benign   LAB RESULTS:  CMP     Component Value Date/Time   NA 140 02/15/2020 1552   K 4.0 02/15/2020 1552   CL 105 02/15/2020 1552   CO2 30 02/15/2020 1552   GLUCOSE 132 (H) 02/15/2020 1552   BUN 9 02/15/2020 1552   CREATININE 0.75 02/15/2020 1552   CALCIUM 9.5 02/15/2020 1552   PROT 7.1 02/15/2020 1552   ALBUMIN 4.0 02/15/2020 1552   AST 23 02/15/2020 1552   ALT 38 02/15/2020 1552   ALKPHOS 81 02/15/2020 1552   BILITOT 0.5 02/15/2020 1552   GFRNONAA >60 02/15/2020 1552    No results found for: TOTALPROTELP, ALBUMINELP, A1GS, A2GS, BETS, BETA2SER, GAMS, MSPIKE, SPEI  Lab Results  Component Value Date   WBC 6.3 02/15/2020   NEUTROABS 3.2 02/15/2020   HGB 12.2 02/15/2020   HCT 38.2 02/15/2020   MCV 89.5 02/15/2020   PLT 252 02/15/2020    No results found for: LABCA2  No components found for: EHOZYY482  No results for input(s): INR in the last 168 hours.  No results found for: LABCA2  No results found for: NOI370  No results found for: WUG891  No results found for: QXI503  No results found for: CA2729  No components found for: HGQUANT  No results found for: CEA1 / No results found for: CEA1   No results found for: AFPTUMOR  No results found for: CHROMOGRNA  No results found for: KPAFRELGTCHN, LAMBDASER, KAPLAMBRATIO (kappa/lambda light chains)  No results found for: HGBA, HGBA2QUANT, HGBFQUANT, HGBSQUAN (Hemoglobinopathy evaluation)   No results found for: LDH  No results found for: IRON, TIBC, IRONPCTSAT (Iron and TIBC)  No results found for: FERRITIN  Urinalysis No results found for: COLORURINE, APPEARANCEUR, LABSPEC, PHURINE,  GLUCOSEU, HGBUR, BILIRUBINUR, KETONESUR, PROTEINUR, UROBILINOGEN, NITRITE, LEUKOCYTESUR   STUDIES: MM CLIP PLACEMENT RIGHT  Result Date: 02/06/2020 CLINICAL DATA:  Post biopsy mammogram of the right breast for clip placement. EXAM: DIAGNOSTIC RIGHT MAMMOGRAM POST ULTRASOUND BIOPSY COMPARISON:  Previous exam(s). FINDINGS: Mammographic images were obtained following ultrasound guided biopsy of 2 masses in the right breast. The biopsy marking clips in expected position at the sites of biopsy. IMPRESSION: 1. Appropriate positioning of the coil shaped biopsy marking clip at the  site of biopsy in the upper-outer right breast (11 o'clock). 2. Appropriate positioning of the heart shaped biopsy marking clip in the upper inner right breast (1 o'clock). Final Assessment: Post Procedure Mammograms for Marker Placement Electronically Signed   By: Ammie Ferrier M.D.   On: 02/06/2020 10:34   [place] RT BREAST BX W LOC DEV 1ST LESION IMG BX SPEC US GUIDE  Addendum Date: 02/07/2020   ADDENDUM REPORT: 02/07/2020 15:00 ADDENDUM: Pathology revealed GRADE II INVASIVE DUCTAL CARCINOMA of the RIGHT breast, 11 o'clock mass. This was found to be concordant by Dr. Ammie Ferrier. 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 This was found to be concordant by Dr. Ammie Ferrier. Pathology results were discussed with the patient and her daughter by telephone. The patient reported doing well after the biopsies with tenderness at the sites. Post biopsy instructions and care were reviewed and questions were answered. The patient was encouraged to call [facility] for any additional concerns. Surgical consultation has been arranged with Dr. Erroll Luna at [facility] on February 13, 2020. Pathology results reported by Stacie Acres RN on 02/07/2020. Electronically Signed   By: Ammie Ferrier M.D.   On: 02/07/2020 15:00   Result Date: 02/07/2020 CLINICAL DATA:   26 year presenting for ultrasound-guided biopsy of 2 right breast masses. EXAM: ULTRASOUND GUIDED RIGHT BREAST CORE NEEDLE BIOPSY COMPARISON:  Previous exam(s). PROCEDURE: I met with the patient and we discussed the procedure of ultrasound-guided biopsy, including benefits and alternatives. We discussed the high likelihood of a successful procedure. We discussed the risks of the procedure, including infection, bleeding, tissue injury, clip migration, and inadequate sampling. Informed written consent was given. The usual time-out protocol was performed immediately prior to the procedure. Lesion quadrant: Upper outer quadrant Using sterile technique and 1% Lidocaine as local anesthetic, under direct ultrasound visualization, a 14 gauge spring-loaded device was used to perform biopsy of a mass in right breast at 11 o'clock using an inferior approach. At the conclusion of the procedure a coil shaped tissue marker clip was deployed into the biopsy cavity. -------------------------------------------------- Lesion quadrant: Upper inner quadrant Using sterile technique and 1% Lidocaine as local anesthetic, under direct ultrasound visualization, a 14 gauge spring-loaded device was used to perform biopsy of a mass in the right breast at 1 o'clock using an inferolateral approach. At the conclusion of the procedure a heart shaped tissue marker clip was deployed into the biopsy cavity. Follow up 2 view mammogram was performed and dictated separately. IMPRESSION: 1. Ultrasound guided biopsy of a right breast mass at 11 o'clock. No apparent complications. 2. Ultrasound-guided biopsy of a right breast mass at 1 o'clock. No apparent complications. Electronically Signed: By: Ammie Ferrier M.D. On: 02/06/2020 10:26   [place] RT BREAST BX W LOC DEV EA ADD LESION IMG BX SPEC US GUIDE  Addendum Date: 02/07/2020   ADDENDUM REPORT: 02/07/2020 15:00 ADDENDUM: Pathology revealed GRADE II INVASIVE DUCTAL CARCINOMA of the RIGHT  breast, 11 o'clock mass. This was found to be concordant by Dr. Ammie Ferrier. 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 This was found to be concordant by Dr. Ammie Ferrier. Pathology results were discussed with the patient and her daughter by telephone. The patient reported doing well after the biopsies with tenderness at the sites. Post biopsy instructions and care were reviewed and questions were answered. The patient was encouraged to call [facility] for any  additional concerns. Surgical consultation has been arranged with Dr. Erroll Luna at [facility] on February 13, 2020. Pathology results reported by Stacie Acres RN on 02/07/2020. Electronically Signed   By: Ammie Ferrier M.D.   On: 02/07/2020 15:00   Result Date: 02/07/2020 CLINICAL DATA:  58 year presenting for ultrasound-guided biopsy of 2 right breast masses. EXAM: ULTRASOUND GUIDED RIGHT BREAST CORE NEEDLE BIOPSY COMPARISON:  Previous exam(s). PROCEDURE: I met with the patient and we discussed the procedure of ultrasound-guided biopsy, including benefits and alternatives. We discussed the high likelihood of a successful procedure. We discussed the risks of the procedure, including infection, bleeding, tissue injury, clip migration, and inadequate sampling. Informed written consent was given. The usual time-out protocol was performed immediately prior to the procedure. Lesion quadrant: Upper outer quadrant Using sterile technique and 1% Lidocaine as local anesthetic, under direct ultrasound visualization, a 14 gauge spring-loaded device was used to perform biopsy of a mass in right breast at 11 o'clock using an inferior approach. At the conclusion of the procedure a coil shaped tissue marker clip was deployed into the biopsy cavity. -------------------------------------------------- Lesion quadrant: Upper inner quadrant Using sterile technique and 1% Lidocaine  as local anesthetic, under direct ultrasound visualization, a 14 gauge spring-loaded device was used to perform biopsy of a mass in the right breast at 1 o'clock using an inferolateral approach. At the conclusion of the procedure a heart shaped tissue marker clip was deployed into the biopsy cavity. Follow up 2 view mammogram was performed and dictated separately. IMPRESSION: 1. Ultrasound guided biopsy of a right breast mass at 11 o'clock. No apparent complications. 2. Ultrasound-guided biopsy of a right breast mass at 1 o'clock. No apparent complications. Electronically Signed: By: Ammie Ferrier M.D. On: 02/06/2020 10:26   IR IMAGING GUIDED PORT INSERTION  Result Date: 02/23/2020 CLINICAL DATA:  Right breast carcinoma and need for porta cath to begin chemotherapy. EXAM: IMPLANTED PORT A CATH PLACEMENT WITH ULTRASOUND AND FLUOROSCOPIC GUIDANCE ANESTHESIA/SEDATION: 4.0 mg IV Versed; 100 mcg IV Fentanyl Total Moderate Sedation Time:  37 minutes The patient's level of consciousness and physiologic status were continuously monitored during the procedure by Radiology nursing. Additional Medications: 2 g IV Ancef. FLUOROSCOPY TIME:  30 seconds.  9.0 mGy. PROCEDURE: The procedure, risks, benefits, and alternatives were explained to the patient. Questions regarding the procedure were encouraged and answered. The patient understands and consents to the procedure. A time-out was performed prior to initiating the procedure. Ultrasound was utilized to confirm patency of the left internal jugular vein. The left neck and chest were prepped with chlorhexidine in a sterile fashion, and a sterile drape was applied covering the operative field. Maximum barrier sterile technique with sterile gowns and gloves were used for the procedure. Local anesthesia was provided with 1% lidocaine. After creating a small venotomy incision, a 21 gauge needle was advanced into the left internal jugular vein under direct, real-time  ultrasound guidance. Ultrasound image documentation was performed. After securing guidewire access, an 8 Fr dilator was placed. A J-wire was kinked to measure appropriate catheter length. A subcutaneous port pocket was then created along the upper chest wall utilizing sharp and blunt dissection. Portable cautery was utilized. The pocket was irrigated with sterile saline. A single lumen power injectable port was chosen for placement. The 8 Fr catheter was tunneled from the port pocket site to the venotomy incision. The port was placed in the pocket. External catheter was trimmed to appropriate length based on guidewire measurement. At  the venotomy, an 8 Fr peel-away sheath was placed over a guidewire. The catheter was then placed through the sheath and the sheath removed. Final catheter positioning was confirmed and documented with a fluoroscopic spot image. The port was accessed with a needle and aspirated and flushed with heparinized saline. The access needle was removed. The venotomy and port pocket incisions were closed with subcutaneous 3-0 Monocryl and subcuticular 4-0 Vicryl. Dermabond was applied to both incisions. COMPLICATIONS: COMPLICATIONS None FINDINGS: After catheter placement, the tip lies at the cavo-atrial junction. The catheter aspirates normally and is ready for immediate use. IMPRESSION: Placement of single lumen port a cath via left internal jugular vein. The catheter tip lies at the cavo-atrial junction. A power injectable port a cath was placed and is ready for immediate use. Electronically Signed   By: Aletta Edouard M.D.   On: 02/23/2020 12:28     ELIGIBLE FOR AVAILABLE RESEARCH PROTOCOL: AET  ASSESSMENT: 64 y.o. [place] woman status post right breast upper outer quadrant (12:00) and axillary lymph node biopsy 02/03/2020, both positive for a clinical T1c N1, stage IIB invasive ductal carcinoma, grade 3, estrogen receptor positive, HER-2 and progesterone receptor negative, with an  MIB-1 of 60%.  (a) biopsy of a second right upper outer quadrant T2 lesion (11 o'clock) 02/06/2020 also showed invasive ductal carcinoma, but grade 2, estrogen receptor positive, progesterone receptor and HER-2 negative, with an MIB-1 of 15%  (b) biopsy of a third right breast lesion (1:00) was benign   (1) neoadjuvant chemotherapy will consist of cyclophosphamide and docetaxel every 21 days x 4 starting 03/05/2020  (2) definitive surgery to follow  (3) adjuvant radiation  (4) antiestrogens   PLAN:  Autymn will start her chemo treatments today.  The fact that she was able to correct her mistake yesterday is very favorable--it means he is paying attention to the routing sheet and knows how to read it.  We reviewed side effects toxicities and complications again and I have asked her to keep a diary of symptoms beginning tonight.  This will facilitate our troubleshooting problems when she sees [place] again in a week.  I have asked her to check her blood sugar tonight and tomorrow morning.  She should write those down and bring those on next visit.  We might be able to lower her Decadron further about this first time at least we are doing the usual doses.  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  We will have to follow this closely and if necessary discontinue the docetaxel and switch to Gemzar.    She knows she needs to come back in 3 days to get her PEG fill gastrium and then we will see her again in a year to troubleshoot symptoms  Total encounter time 25 minutes.Sarajane Jews C. Shadd Dunstan, MD 03/05/2020 9:16 AM Medical Oncology and Hematology [facility] [place], [place] [address] Tel. [phone]    Fax. [phone]   This document serves as a record of services personally performed by Lurline Del, MD. It was created on his behalf by Wilburn Mylar, a trained medical scribe. The creation of  this record is based on the scribe's personal observations and the provider's statements to them.   I, Lurline Del MD, have reviewed the above documentation for accuracy and completeness, and I agree with the above.   *Total Encounter Time as defined by the Centers for Medicare and Medicaid  Services includes, in addition to the face-to-face time of a patient visit (documented in the note above) non-face-to-face time: obtaining and reviewing outside history, ordering and reviewing medications, tests or procedures, care coordination (communications with other health care professionals or caregivers) and documentation in the medical record.

## 2020-03-05 ENCOUNTER — Inpatient Hospital Stay: Payer: Self-pay

## 2020-03-05 ENCOUNTER — Other Ambulatory Visit: Payer: Self-pay

## 2020-03-05 ENCOUNTER — Encounter: Payer: Self-pay | Admitting: *Deleted

## 2020-03-05 ENCOUNTER — Inpatient Hospital Stay (HOSPITAL_BASED_OUTPATIENT_CLINIC_OR_DEPARTMENT_OTHER): Payer: Self-pay | Admitting: Oncology

## 2020-03-05 VITALS — BP 160/80 | HR 69 | Resp 18

## 2020-03-05 VITALS — BP 141/76 | HR 79 | Temp 97.9°F | Resp 18 | Ht 66.0 in | Wt 219.8 lb

## 2020-03-05 DIAGNOSIS — Z170 Estrogen receptor positive status [ER+]: Secondary | ICD-10-CM

## 2020-03-05 DIAGNOSIS — C50411 Malignant neoplasm of upper-outer quadrant of right female breast: Secondary | ICD-10-CM

## 2020-03-05 DIAGNOSIS — Z95828 Presence of other vascular implants and grafts: Secondary | ICD-10-CM

## 2020-03-05 LAB — CMP (CANCER CENTER ONLY)
ALT: 14 U/L (ref 0–44)
AST: 13 U/L — ABNORMAL LOW (ref 15–41)
Albumin: 3.9 g/dL (ref 3.5–5.0)
Alkaline Phosphatase: 85 U/L (ref 38–126)
Anion gap: 11 (ref 5–15)
BUN: 10 mg/dL (ref 8–23)
CO2: 22 mmol/L (ref 22–32)
Calcium: 10.3 mg/dL (ref 8.9–10.3)
Chloride: 107 mmol/L (ref 98–111)
Creatinine: 0.77 mg/dL (ref 0.44–1.00)
GFR, Estimated: >60 mL/min (ref >60)
Glucose, Bld: 158 mg/dL — ABNORMAL HIGH (ref 70–99)
Potassium: 3.4 mmol/L — ABNORMAL LOW (ref 3.5–5.1)
Sodium: 140 mmol/L (ref 135–145)
Total Bilirubin: 0.6 mg/dL (ref 0.3–1.2)
Total Protein: 7 g/dL (ref 6.5–8.1)

## 2020-03-05 LAB — CBC WITH DIFFERENTIAL (CANCER CENTER ONLY)
Abs Immature Granulocytes: 0.02 10*3/uL (ref 0.00–0.07)
Basophils Absolute: 0 10*3/uL (ref 0.0–0.1)
Basophils Relative: 0 %
Eosinophils Absolute: 0 10*3/uL (ref 0.0–0.5)
Eosinophils Relative: 0 %
HCT: 37.6 % (ref 36.0–46.0)
Hemoglobin: 12.5 g/dL (ref 12.0–15.0)
Immature Granulocytes: 0 %
Lymphocytes Relative: 16 %
Lymphs Abs: 1.2 10*3/uL (ref 0.7–4.0)
MCH: 29.2 pg (ref 26.0–34.0)
MCHC: 33.2 g/dL (ref 30.0–36.0)
MCV: 87.9 fL (ref 80.0–100.0)
Monocytes Absolute: 0.3 10*3/uL (ref 0.1–1.0)
Monocytes Relative: 4 %
Neutro Abs: 6 10*3/uL (ref 1.7–7.7)
Neutrophils Relative %: 80 %
Platelet Count: 270 10*3/uL (ref 150–400)
RBC: 4.28 MIL/uL (ref 3.87–5.11)
RDW: 13.5 % (ref 11.5–15.5)
WBC Count: 7.5 10*3/uL (ref 4.0–10.5)
nRBC: 0 % (ref 0.0–0.2)

## 2020-03-05 MED ORDER — SODIUM CHLORIDE 0.9 % IV SOLN
600.0000 mg/m2 | Freq: Once | INTRAVENOUS | Status: AC
  Administered 2020-03-05: 1300 mg via INTRAVENOUS
  Filled 2020-03-05: qty 65

## 2020-03-05 MED ORDER — SODIUM CHLORIDE 0.9% FLUSH
10.0000 mL | Freq: Once | INTRAVENOUS | Status: AC
  Administered 2020-03-05: 10 mL via INTRAVENOUS
  Filled 2020-03-05: qty 10

## 2020-03-05 MED ORDER — SODIUM CHLORIDE 0.9% FLUSH
10.0000 mL | INTRAVENOUS | Status: DC | PRN
  Administered 2020-03-05: 10 mL
  Filled 2020-03-05: qty 10

## 2020-03-05 MED ORDER — SODIUM CHLORIDE 0.9 % IV SOLN
Freq: Once | INTRAVENOUS | Status: AC
  Filled 2020-03-05: qty 250

## 2020-03-05 MED ORDER — PALONOSETRON HCL INJECTION 0.25 MG/5ML
INTRAVENOUS | Status: AC
  Filled 2020-03-05: qty 5

## 2020-03-05 MED ORDER — PALONOSETRON HCL INJECTION 0.25 MG/5ML
0.2500 mg | Freq: Once | INTRAVENOUS | Status: AC
  Administered 2020-03-05: 0.25 mg via INTRAVENOUS

## 2020-03-05 MED ORDER — HEPARIN SOD (PORK) LOCK FLUSH 100 UNIT/ML IV SOLN
500.0000 [IU] | Freq: Once | INTRAVENOUS | Status: AC | PRN
  Administered 2020-03-05: 500 [IU]
  Filled 2020-03-05: qty 5

## 2020-03-05 MED ORDER — SODIUM CHLORIDE 0.9 % IV SOLN
75.0000 mg/m2 | Freq: Once | INTRAVENOUS | Status: AC
  Administered 2020-03-05: 160 mg via INTRAVENOUS
  Filled 2020-03-05: qty 16

## 2020-03-05 MED ORDER — SODIUM CHLORIDE 0.9 % IV SOLN
10.0000 mg | Freq: Once | INTRAVENOUS | Status: AC
  Administered 2020-03-05: 10 mg via INTRAVENOUS
  Filled 2020-03-05: qty 10

## 2020-03-05 NOTE — Patient Instructions (Signed)
[facility] Discharge Instructions for Patients Receiving Chemotherapy  Today you received the following chemotherapy agents docetaxel, cyclophosphamide.  To help prevent nausea and vomiting after your treatment, we encourage you to take your nausea medication as directed.    If you develop nausea and vomiting that is not controlled by your nausea medication, call the clinic.   BELOW ARE SYMPTOMS THAT SHOULD BE REPORTED IMMEDIATELY:  *FEVER GREATER THAN 100.5 F  *CHILLS WITH OR WITHOUT FEVER  NAUSEA AND VOMITING THAT IS NOT CONTROLLED WITH YOUR NAUSEA MEDICATION  *UNUSUAL SHORTNESS OF BREATH  *UNUSUAL BRUISING OR BLEEDING  TENDERNESS IN MOUTH AND THROAT WITH OR WITHOUT PRESENCE OF ULCERS  *URINARY PROBLEMS  *BOWEL PROBLEMS  UNUSUAL RASH Items with * indicate a potential emergency and should be followed up as soon as possible.  Feel free to call the clinic should you have any questions or concerns. The clinic phone number is [phone].  Please show the [facility] at check-in to the Emergency Department and triage nurse.  Docetaxel injection What is this medicine? DOCETAXEL (doe se TAX el) is a chemotherapy drug. It targets fast dividing cells, like cancer cells, and causes these cells to die. This medicine is used to treat many types of cancers like breast cancer, certain stomach cancers, head and neck cancer, lung cancer, and prostate cancer. This medicine may be used for other purposes; ask your health care provider or pharmacist if you have questions. COMMON BRAND NAME(S): Docefrez, Taxotere What should I tell my health care provider before I take this medicine? They need to know if you have any of these conditions:  infection (especially a virus infection such as chickenpox, cold sores, or herpes)  liver disease  low blood counts, like low white cell, platelet, or red cell counts  an unusual or allergic reaction to docetaxel, polysorbate 80,  other chemotherapy agents, other medicines, foods, dyes, or preservatives  pregnant or trying to get pregnant  breast-feeding How should I use this medicine? This drug is given as an infusion into a vein. It is administered in a hospital or clinic by a specially trained health care professional. Talk to your pediatrician regarding the use of this medicine in children. Special care may be needed. Overdosage: If you think you have taken too much of this medicine contact a poison control center or emergency room at once. NOTE: This medicine is only for you. Do not share this medicine with others. What if I miss a dose? It is important not to miss your dose. Call your doctor or health care professional if you are unable to keep an appointment. What may interact with this medicine?  aprepitant  certain antibiotics like erythromycin or clarithromycin  certain antivirals for HIV or hepatitis  certain medicines for fungal infections like fluconazole, itraconazole, ketoconazole, posaconazole, or voriconazole  cimetidine  ciprofloxacin  conivaptan  cyclosporine  dronedarone  fluvoxamine  grapefruit juice  imatinib  verapamil This list may not describe all possible interactions. Give your health care provider a list of all the medicines, herbs, non-prescription drugs, or dietary supplements you use. Also tell them if you smoke, drink alcohol, or use illegal drugs. Some items may interact with your medicine. What should I watch for while using this medicine? Your condition will be monitored carefully while you are receiving this medicine. You will need important blood work done while you are taking this medicine. Call your doctor or health care professional for advice if you get a fever,  chills or sore throat, or other symptoms of a cold or flu. Do not treat yourself. This drug decreases your body's ability to fight infections. Try to avoid being around people who are sick. Some  products may contain alcohol. Ask your health care professional if this medicine contains alcohol. Be sure to tell all health care professionals you are taking this medicine. Certain medicines, like metronidazole and disulfiram, can cause an unpleasant reaction when taken with alcohol. The reaction includes flushing, headache, nausea, vomiting, sweating, and increased thirst. The reaction can last from 30 minutes to several hours. You may get drowsy or dizzy. Do not drive, use machinery, or do anything that needs mental alertness until you know how this medicine affects you. Do not stand or sit up quickly, especially if you are an older patient. This reduces the risk of dizzy or fainting spells. Alcohol may interfere with the effect of this medicine. Talk to your health care professional about your risk of cancer. You may be more at risk for certain types of cancer if you take this medicine. Do not become pregnant while taking this medicine or for 6 months after stopping it. Women should inform their doctor if they wish to become pregnant or think they might be pregnant. There is a potential for serious side effects to an unborn child. Talk to your health care professional or pharmacist for more information. Do not breast-feed an infant while taking this medicine or for 1 week after stopping it. Males who get this medicine must use a condom during sex with females who can get pregnant. If you get a woman pregnant, the baby could have birth defects. The baby could die before they are born. You will need to continue wearing a condom for 3 months after stopping the medicine. Tell your health care provider right away if your partner becomes pregnant while you are taking this medicine. This may interfere with the ability to father a child. You should talk to your doctor or health care professional if you are concerned about your fertility. What side effects may I notice from receiving this medicine? Side effects  that you should report to your doctor or health care professional as soon as possible:  allergic reactions like skin rash, itching or hives, swelling of the face, lips, or tongue  blurred vision  breathing problems  changes in vision  low blood counts - This drug may decrease the number of white blood cells, red blood cells and platelets. You may be at increased risk for infections and bleeding.  nausea and vomiting  pain, redness or irritation at site where injected  pain, tingling, numbness in the hands or feet  redness, blistering, peeling, or loosening of the skin, including inside the mouth  signs of decreased platelets or bleeding - bruising, pinpoint red spots on the skin, black, tarry stools, nosebleeds  signs of decreased red blood cells - unusually weak or tired, fainting spells, lightheadedness  signs of infection - fever or chills, cough, sore throat, pain or difficulty passing urine  swelling of the ankle, feet, hands Side effects that usually do not require medical attention (report to your doctor or health care professional if they continue or are bothersome):  constipation  diarrhea  fingernail or toenail changes  hair loss  loss of appetite  mouth sores  muscle pain This list may not describe all possible side effects. Call your doctor for medical advice about side effects. You may report side effects to FDA at 1-800-FDA-1088.  Where should I keep my medicine? This drug is given in a hospital or clinic and will not be stored at home. NOTE: This sheet is a summary. It may not cover all possible information. If you have questions about this medicine, talk to your doctor, pharmacist, or health care provider.  2020 Elsevier/Gold Standard (2018-12-23 10:19:06)  Cyclophosphamide Injection What is this medicine? CYCLOPHOSPHAMIDE (sye kloe FOSS fa mide) is a chemotherapy drug. It slows the growth of cancer cells. This medicine is used to treat many types of  cancer like lymphoma, myeloma, leukemia, breast cancer, and ovarian cancer, to name a few. This medicine may be used for other purposes; ask your health care provider or pharmacist if you have questions. COMMON BRAND NAME(S): Cytoxan, Neosar What should I tell my health care provider before I take this medicine? They need to know if you have any of these conditions:  heart disease  history of irregular heartbeat  infection  kidney disease  liver disease  low blood counts, like white cells, platelets, or red blood cells  on hemodialysis  recent or ongoing radiation therapy  scarring or thickening of the lungs  trouble passing urine  an unusual or allergic reaction to cyclophosphamide, other medicines, foods, dyes, or preservatives  pregnant or trying to get pregnant  breast-feeding How should I use this medicine? This drug is usually given as an injection into a vein or muscle or by infusion into a vein. It is administered in a hospital or clinic by a specially trained health care professional. Talk to your pediatrician regarding the use of this medicine in children. Special care may be needed. Overdosage: If you think you have taken too much of this medicine contact a poison control center or emergency room at once. NOTE: This medicine is only for you. Do not share this medicine with others. What if I miss a dose? It is important not to miss your dose. Call your doctor or health care professional if you are unable to keep an appointment. What may interact with this medicine?  amphotericin B  azathioprine  certain antivirals for HIV or hepatitis  certain medicines for blood pressure, heart disease, irregular heart beat  certain medicines that treat or prevent blood clots like warfarin  certain other medicines for cancer  cyclosporine  etanercept  indomethacin  medicines that relax muscles for surgery  medicines to increase blood counts  metronidazole This  list may not describe all possible interactions. Give your health care provider a list of all the medicines, herbs, non-prescription drugs, or dietary supplements you use. Also tell them if you smoke, drink alcohol, or use illegal drugs. Some items may interact with your medicine. What should I watch for while using this medicine? Your condition will be monitored carefully while you are receiving this medicine. You may need blood work done while you are taking this medicine. Drink water or other fluids as directed. Urinate often, even at night. Some products may contain alcohol. Ask your health care professional if this medicine contains alcohol. Be sure to tell all health care professionals you are taking this medicine. Certain medicines, like metronidazole and disulfiram, can cause an unpleasant reaction when taken with alcohol. The reaction includes flushing, headache, nausea, vomiting, sweating, and increased thirst. The reaction can last from 30 minutes to several hours. Do not become pregnant while taking this medicine or for 1 year after stopping it. Women should inform their health care professional if they wish to become pregnant or think they  might be pregnant. Men should not father a child while taking this medicine and for 4 months after stopping it. There is potential for serious side effects to an unborn child. Talk to your health care professional for more information. Do not breast-feed an infant while taking this medicine or for 1 week after stopping it. This medicine has caused ovarian failure in some women. This medicine may make it more difficult to get pregnant. Talk to your health care professional if you are concerned about your fertility. This medicine has caused decreased sperm counts in some men. This may make it more difficult to father a child. Talk to your health care professional if you are concerned about your fertility. Call your health care professional for advice if you  get a fever, chills, or sore throat, or other symptoms of a cold or flu. Do not treat yourself. This medicine decreases your body's ability to fight infections. Try to avoid being around people who are sick. Avoid taking medicines that contain aspirin, acetaminophen, ibuprofen, naproxen, or ketoprofen unless instructed by your health care professional. These medicines may hide a fever. Talk to your health care professional about your risk of cancer. You may be more at risk for certain types of cancer if you take this medicine. If you are going to need surgery or other procedure, tell your health care professional that you are using this medicine. Be careful brushing or flossing your teeth or using a toothpick because you may get an infection or bleed more easily. If you have any dental work done, tell your dentist you are receiving this medicine. What side effects may I notice from receiving this medicine? Side effects that you should report to your doctor or health care professional as soon as possible:  allergic reactions like skin rash, itching or hives, swelling of the face, lips, or tongue  breathing problems  nausea, vomiting  signs and symptoms of bleeding such as bloody or black, tarry stools; red or dark brown urine; spitting up blood or brown material that looks like coffee grounds; red spots on the skin; unusual bruising or bleeding from the eyes, gums, or nose  signs and symptoms of heart failure like fast, irregular heartbeat, sudden weight gain; swelling of the ankles, feet, hands  signs and symptoms of infection like fever; chills; cough; sore throat; pain or trouble passing urine  signs and symptoms of kidney injury like trouble passing urine or change in the amount of urine  signs and symptoms of liver injury like dark yellow or brown urine; general ill feeling or flu-like symptoms; light-colored stools; loss of appetite; nausea; right upper belly pain; unusually weak or tired;  yellowing of the eyes or skin Side effects that usually do not require medical attention (report to your doctor or health care professional if they continue or are bothersome):  confusion  decreased hearing  diarrhea  facial flushing  hair loss  headache  loss of appetite  missed menstrual periods  signs and symptoms of low red blood cells or anemia such as unusually weak or tired; feeling faint or lightheaded; falls  skin discoloration This list may not describe all possible side effects. Call your doctor for medical advice about side effects. You may report side effects to FDA at 1-800-FDA-1088. Where should I keep my medicine? This drug is given in a hospital or clinic and will not be stored at home. NOTE: This sheet is a summary. It may not cover all possible information. If you have  questions about this medicine, talk to your doctor, pharmacist, or health care provider.  2020 Elsevier/Gold Standard (2019-01-31 09:53:29)

## 2020-03-06 ENCOUNTER — Telehealth: Payer: Self-pay | Admitting: *Deleted

## 2020-03-06 NOTE — Telephone Encounter (Signed)
-----   Message from Gillian Shields, RN sent at 03/05/2020  1:33 PM EDT ----- Regarding: Dr. Jana Hakim-- first time follow up First time docetaxel / cyclophosphamide. Tolerated well.

## 2020-03-06 NOTE — Telephone Encounter (Signed)
Called pt to see how she did with her  Treatment yest.  She reports doing well with no c/o's.  Denied any questions/concerns.  She knows how to reach [place].

## 2020-03-07 ENCOUNTER — Inpatient Hospital Stay: Payer: Self-pay

## 2020-03-07 ENCOUNTER — Other Ambulatory Visit: Payer: Self-pay

## 2020-03-07 VITALS — BP 146/90 | HR 86 | Temp 98.4°F | Resp 18

## 2020-03-07 DIAGNOSIS — Z170 Estrogen receptor positive status [ER+]: Secondary | ICD-10-CM

## 2020-03-07 MED ORDER — PEGFILGRASTIM-CBQV 6 MG/0.6ML ~~LOC~~ SOSY
6.0000 mg | PREFILLED_SYRINGE | Freq: Once | SUBCUTANEOUS | Status: AC
  Administered 2020-03-07: 6 mg via SUBCUTANEOUS

## 2020-03-07 MED ORDER — PEGFILGRASTIM-CBQV 6 MG/0.6ML ~~LOC~~ SOSY
PREFILLED_SYRINGE | SUBCUTANEOUS | Status: AC
  Filled 2020-03-07: qty 0.6

## 2020-03-07 NOTE — Patient Instructions (Signed)

## 2020-03-13 ENCOUNTER — Other Ambulatory Visit: Payer: Self-pay

## 2020-03-13 DIAGNOSIS — Z170 Estrogen receptor positive status [ER+]: Secondary | ICD-10-CM

## 2020-03-13 DIAGNOSIS — C50411 Malignant neoplasm of upper-outer quadrant of right female breast: Secondary | ICD-10-CM

## 2020-03-14 ENCOUNTER — Encounter: Payer: Self-pay | Admitting: *Deleted

## 2020-03-14 ENCOUNTER — Other Ambulatory Visit: Payer: Self-pay

## 2020-03-14 ENCOUNTER — Encounter: Payer: Self-pay | Admitting: Adult Health

## 2020-03-14 ENCOUNTER — Inpatient Hospital Stay: Payer: Self-pay

## 2020-03-14 ENCOUNTER — Inpatient Hospital Stay: Payer: Self-pay | Attending: Oncology | Admitting: Adult Health

## 2020-03-14 VITALS — BP 94/56 | HR 79 | Temp 98.1°F | Resp 18 | Ht 66.0 in | Wt 211.9 lb

## 2020-03-14 DIAGNOSIS — C50411 Malignant neoplasm of upper-outer quadrant of right female breast: Secondary | ICD-10-CM | POA: Insufficient documentation

## 2020-03-14 DIAGNOSIS — Z170 Estrogen receptor positive status [ER+]: Secondary | ICD-10-CM | POA: Insufficient documentation

## 2020-03-14 DIAGNOSIS — Z5111 Encounter for antineoplastic chemotherapy: Secondary | ICD-10-CM | POA: Insufficient documentation

## 2020-03-14 DIAGNOSIS — Z79899 Other long term (current) drug therapy: Secondary | ICD-10-CM | POA: Insufficient documentation

## 2020-03-14 DIAGNOSIS — R5383 Other fatigue: Secondary | ICD-10-CM | POA: Insufficient documentation

## 2020-03-14 LAB — CBC WITH DIFFERENTIAL (CANCER CENTER ONLY)
Abs Immature Granulocytes: 3.2 10*3/uL — ABNORMAL HIGH (ref 0.00–0.07)
Band Neutrophils: 10 %
Basophils Absolute: 0 10*3/uL (ref 0.0–0.1)
Basophils Relative: 0 %
Eosinophils Absolute: 0 10*3/uL (ref 0.0–0.5)
Eosinophils Relative: 0 %
HCT: 39.5 % (ref 36.0–46.0)
Hemoglobin: 13.4 g/dL (ref 12.0–15.0)
Lymphocytes Relative: 32 %
Lymphs Abs: 8.6 10*3/uL — ABNORMAL HIGH (ref 0.7–4.0)
MCH: 29.2 pg (ref 26.0–34.0)
MCHC: 33.9 g/dL (ref 30.0–36.0)
MCV: 86.1 fL (ref 80.0–100.0)
Metamyelocytes Relative: 3 %
Monocytes Absolute: 1.9 10*3/uL — ABNORMAL HIGH (ref 0.1–1.0)
Monocytes Relative: 7 %
Myelocytes: 9 %
Neutro Abs: 13.1 10*3/uL — ABNORMAL HIGH (ref 1.7–7.7)
Neutrophils Relative %: 39 %
Platelet Count: 282 10*3/uL (ref 150–400)
RBC: 4.59 MIL/uL (ref 3.87–5.11)
RDW: 13.2 % (ref 11.5–15.5)
WBC Count: 26.8 10*3/uL — ABNORMAL HIGH (ref 4.0–10.5)
nRBC: 0.2 % (ref 0.0–0.2)

## 2020-03-14 LAB — CMP (CANCER CENTER ONLY)
ALT: 17 U/L (ref 0–44)
AST: 13 U/L — ABNORMAL LOW (ref 15–41)
Albumin: 3.5 g/dL (ref 3.5–5.0)
Alkaline Phosphatase: 86 U/L (ref 38–126)
Anion gap: 10 (ref 5–15)
BUN: 9 mg/dL (ref 8–23)
CO2: 25 mmol/L (ref 22–32)
Calcium: 8.9 mg/dL (ref 8.9–10.3)
Chloride: 103 mmol/L (ref 98–111)
Creatinine: 0.83 mg/dL (ref 0.44–1.00)
GFR, Estimated: >60 mL/min (ref >60)
Glucose, Bld: 98 mg/dL (ref 70–99)
Potassium: 3.8 mmol/L (ref 3.5–5.1)
Sodium: 138 mmol/L (ref 135–145)
Total Bilirubin: 0.4 mg/dL (ref 0.3–1.2)
Total Protein: 5.9 g/dL — ABNORMAL LOW (ref 6.5–8.1)

## 2020-03-14 MED ORDER — FLUCONAZOLE 200 MG PO TABS: 200.0000 mg | ORAL_TABLET | Freq: Every day | ORAL | 0 refills | Status: DC

## 2020-03-14 NOTE — Progress Notes (Signed)
Uncertain  [phone] Fax:([phone]     ID: [name] DOB: 09-25-1955  MR#: [id number]  SFK#:[id number]  Patient Care Team: Patient, No Pcp Per as PCP - General (General Practice) Mauro Kaufmann, RN as Oncology Nurse Navigator Rockwell Germany, RN as Oncology Nurse Navigator Magrinat, Virgie Dad, MD as Consulting Physician (Oncology) Erroll Luna, MD as Consulting Physician (General Surgery) Eppie Gibson, MD as Attending Physician (Radiation Oncology) Scot Dock, NP OTHER MD:  CHIEF COMPLAINT: Estrogen receptor positive breast cancer  CURRENT TREATMENT: Neoadjuvant chemotherapy   INTERVAL HISTORY: [name] returns today for follow up of her estrogen receptor positive breast cancer.   She is undergoing neoadjuvant chemotherapy with cyclophosphamide and docetaxel every 21 days for 4 cycles.  She receives growth factor support with Pegfilgrastim (or biosimilar) on day 3 of every cycle.  Today is cycle 1 day 8 of her treatment.  She is here for her nadir check.    REVIEW OF SYSTEMS: [name] [name] is doing moderately well.  She has some dry lips.  She is mildly fatigued.  She had slight loose BMs after she first received chemotherapy that have since resolved.  She denies any appetite decrease and notes she is eating and drinking well. She has mild intermittent numbness in her toes that is chronic.  This has not worsened since her first cycle of chemotherapy.    Otherwise she has no nausea, vomiting, bladder changes, headaches, vision issues, or any other concrens.  A detailed ROS was otherwise non contributory.    HISTORY OF CURRENT ILLNESS: From the original intake note:  [name] [name] herself palpated a mass in the right axilla "a long time ago", more recently with complaints of associated pain. She brought it to medical attention and underwent bilateral diagnostic mammography with tomography and bilateral breast ultrasonography at [facility] on  01/31/2020 showing: breast density category B; three right breast masses-- 1.9 cm at 12 o'clock, 2.3 cm at 11 o'clock, 0.4 cm at 1 o'clock; two adjacent enlarged thickened lymph nodes measuring up to 2.1 cm; no evidence of left breast malignancy.  Accordingly on 02/03/2020 she proceeded to biopsy of the right breast area at 12 o'clock and right axilla. The pathology from this procedure ([id number]) showed: invasive ductal carcinoma, grade 3. Prognostic indicators significant for: estrogen receptor, 100% positive with strong staining intensity and progesterone receptor, 0% negative. Proliferation marker Ki67 at 60%. HER2 equivocal by immunohistochemistry (2+), but negative by fluorescent in situ hybridization with a signals ratio 0.81 and number per cell 2.7.  The biopsied lymph node was found to show metastatic carcinoma. The morphology was considered different from the biopsied mass, and a prognostic panel was performed. Estrogen receptor, 100% positive with strong staining intensity and progesterone receptor, 0% negative. Proliferation marker Ki67 at 70%. HER2 equivcoal by immunohistochemistry (2+), but negative) by fluorescent in situ hybridization with a signals ratio 1.16 and number per cell 3.9.  She underwent additional right breast biopsies on 02/06/2020. Pathology [id number]) from the mass at 11 o'clock showed: invasive ductal carcinoma, grade 2. Prognostic indicators significant for: estrogen receptor, 100% positive with strong staining intensity and progesterone receptor, 0% negative. Proliferation marker Ki67 at 15%. HER2 equivocal by immunohistochemistry (2+), but negative by fluorescent in situ hybridization with a signals ratio 1.29 and number per cell 2.7.  The biopsied mass at 1 o'clock showed only fibrocystic change.  The patient's subsequent history is as detailed below.   PAST MEDICAL HISTORY: Past Medical History:  Diagnosis  Date  . Arthritis   . Diabetes mellitus without  complication ([place])   . Hypertension     PAST SURGICAL HISTORY: Past Surgical History:  Procedure Laterality Date  . IR IMAGING GUIDED PORT INSERTION  02/23/2020    FAMILY HISTORY: Family History  Problem Relation Age of Onset  . Asthma Mother   . Arthritis Sister   . Asthma Brother   . Arthritis Brother   [name] died in his 67s from causes unknown to the patient.  [name] died from heart disease at age 20.  The patient had 4 brothers, 1 sister, with no history of cancer in the family to her knowledge   GYNECOLOGIC HISTORY:  No LMP recorded. Patient is postmenopausal. Menarche: 64 years old Age at first live birth: 64 years old [place] P 1 LMP 12s Contraceptive HRT no  Hysterectomy? no BSO? no   SOCIAL HISTORY: (updated 02/2020)  Mishika is originally from [place].  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  She describes herself as single.  At home she lives with her daughter [name] who works for [employer], her daughters husband [name] who is a English as a second language teacher, and their children who are 26 months old and 37 years old.  The patient is in [place]: Not in place.  The patient tells me she would intend to name her daughter [name] as her healthcare power of attorney   HEALTH MAINTENANCE: Social History   Tobacco Use  . Smoking status: Never Smoker  . Smokeless tobacco: Never Used  Vaping Use  . Vaping Use: Never used  Substance Use Topics  . Alcohol use: Yes    Alcohol/week: 1.0 standard drink    Types: 1 Glasses of wine per week    Comment: occasionally  . Drug use: Never     Colonoscopy: never done  PAP: 2017 (performed in [place])  Bone density:    Allergies  Allergen Reactions  . Sulfa Antibiotics Itching    Current Outpatient Medications  Medication Sig Dispense Refill  . amLODipine (NORVASC) 5 MG tablet Take 5 mg by mouth daily.    . ASPIRIN 81 81 MG chewable tablet Chew 1 tablet by  mouth daily.    Marland Kitchen atorvastatin (LIPITOR) 40 MG tablet Take 40 mg by mouth daily.    . carvedilol (COREG) 6.25 MG tablet Take 6.25 mg by mouth 2 (two) times daily with a meal.    . celecoxib (CELEBREX) 200 MG capsule Take 200 mg by mouth daily.     Marland Kitchen dexamethasone (DECADRON) 4 MG tablet Take 2 tablets (8 mg total) by mouth 2 (two) times daily. Start the day before Taxotere. Then again the day after chemo for 3 days. 30 tablet 1  . lansoprazole (PREVACID) 30 MG capsule Take 30 mg by mouth every morning.    . lidocaine-prilocaine (EMLA) cream Apply to affected area once 30 g 3  . LORazepam (ATIVAN) 0.5 MG tablet Take 1 tablet (0.5 mg total) by mouth at bedtime as needed (Nausea or vomiting). 30 tablet 0  . losartan (COZAAR) 100 MG tablet Take 100 mg by mouth daily.    . metFORMIN (GLUCOPHAGE) 1000 MG tablet Take 1,000 mg by mouth daily with breakfast.     . prochlorperazine (COMPAZINE) 10 MG tablet Take 1 tablet (10 mg total) by mouth 4 (four) times daily -  before meals and at bedtime. Start the evening of chemotherapy and continue for  2 days, then take as needed. 30 tablet 1  . fluconazole (DIFLUCAN) 200 MG tablet Take 1 tablet (200 mg total) by mouth daily. 30 tablet 0   No current facility-administered medications for this visit.    OBJECTIVE:   Vitals:   03/14/20 0815  BP: (!) 94/56  Pulse: 79  Resp: 18  Temp: 98.1 F (36.7 C)  SpO2: 99%     Body mass index is 34.2 kg/m.   Wt Readings from Last 3 Encounters:  03/14/20 211 lb 14.4 oz (96.1 kg)  03/05/20 219 lb 12.8 oz (99.7 kg)  02/15/20 218 lb 4.8 oz (99 kg)      ECOG FS:2 - Symptomatic, <50% confined to bed GENERAL: Patient is a well appearing female in no acute distress HEENT:  Sclerae anicteric. + thrush in buccual mucosa bilaterally, and posterior pharynx. Neck is supple.  NODES:  No cervical, supraclavicular, or axillary lymphadenopathy palpated.  BREAST EXAM:  Deferred. LUNGS:  Clear to auscultation bilaterally.  No  wheezes or rhonchi. HEART:  Regular rate and rhythm. No murmur appreciated. ABDOMEN:  Soft, nontender.  Positive, normoactive bowel sounds. No organomegaly palpated. MSK:  No focal spinal tenderness to palpation. Full range of motion bilaterally in the upper extremities. EXTREMITIES:  No peripheral edema.   SKIN:  Clear with no obvious rashes or skin changes. No nail dyscrasia. NEURO:  Nonfocal. Well oriented.  Appropriate affect.     LAB RESULTS:  CMP     Component Value Date/Time   NA 138 03/14/2020 0756   K 3.8 03/14/2020 0756   CL 103 03/14/2020 0756   CO2 25 03/14/2020 0756   GLUCOSE 98 03/14/2020 0756   BUN 9 03/14/2020 0756   CREATININE 0.83 03/14/2020 0756   CALCIUM 8.9 03/14/2020 0756   PROT 5.9 (L) 03/14/2020 0756   ALBUMIN 3.5 03/14/2020 0756   AST 13 (L) 03/14/2020 0756   ALT 17 03/14/2020 0756   ALKPHOS 86 03/14/2020 0756   BILITOT 0.4 03/14/2020 0756   GFRNONAA >60 03/14/2020 0756    No results found for: TOTALPROTELP, ALBUMINELP, A1GS, A2GS, BETS, BETA2SER, GAMS, MSPIKE, SPEI  Lab Results  Component Value Date   WBC 26.8 (H) 03/14/2020   NEUTROABS 13.1 (H) 03/14/2020   HGB 13.4 03/14/2020   HCT 39.5 03/14/2020   MCV 86.1 03/14/2020   PLT 282 03/14/2020    No results found for: LABCA2  No components found for: OMBTDH741  No results for input(s): INR in the last 168 hours.  No results found for: LABCA2  No results found for: ULA453  No results found for: MIW803  No results found for: OZY248  No results found for: CA2729  No components found for: HGQUANT  No results found for: CEA1 / No results found for: CEA1   No results found for: AFPTUMOR  No results found for: CHROMOGRNA  No results found for: KPAFRELGTCHN, LAMBDASER, KAPLAMBRATIO (kappa/lambda light chains)  No results found for: HGBA, HGBA2QUANT, HGBFQUANT, HGBSQUAN (Hemoglobinopathy evaluation)   No results found for: LDH  No results found for: IRON, TIBC,  IRONPCTSAT (Iron and TIBC)  No results found for: FERRITIN  Urinalysis No results found for: COLORURINE, APPEARANCEUR, LABSPEC, PHURINE, GLUCOSEU, HGBUR, BILIRUBINUR, KETONESUR, PROTEINUR, UROBILINOGEN, NITRITE, LEUKOCYTESUR   STUDIES: IR IMAGING GUIDED PORT INSERTION  Result Date: 02/23/2020 CLINICAL DATA:  Right breast carcinoma and need for porta cath to begin chemotherapy. EXAM: IMPLANTED PORT A CATH PLACEMENT WITH ULTRASOUND AND FLUOROSCOPIC GUIDANCE ANESTHESIA/SEDATION: 4.0 mg IV Versed; 100 mcg IV  Fentanyl Total Moderate Sedation Time:  37 minutes The patient's level of consciousness and physiologic status were continuously monitored during the procedure by Radiology nursing. Additional Medications: 2 g IV Ancef. FLUOROSCOPY TIME:  30 seconds.  9.0 mGy. PROCEDURE: The procedure, risks, benefits, and alternatives were explained to the patient. Questions regarding the procedure were encouraged and answered. The patient understands and consents to the procedure. A time-out was performed prior to initiating the procedure. Ultrasound was utilized to confirm patency of the left internal jugular vein. The left neck and chest were prepped with chlorhexidine in a sterile fashion, and a sterile drape was applied covering the operative field. Maximum barrier sterile technique with sterile gowns and gloves were used for the procedure. Local anesthesia was provided with 1% lidocaine. After creating a small venotomy incision, a 21 gauge needle was advanced into the left internal jugular vein under direct, real-time ultrasound guidance. Ultrasound image documentation was performed. After securing guidewire access, an 8 Fr dilator was placed. A J-wire was kinked to measure appropriate catheter length. A subcutaneous port pocket was then created along the upper chest wall utilizing sharp and blunt dissection. Portable cautery was utilized. The pocket was irrigated with sterile saline. A single lumen power  injectable port was chosen for placement. The 8 Fr catheter was tunneled from the port pocket site to the venotomy incision. The port was placed in the pocket. External catheter was trimmed to appropriate length based on guidewire measurement. At the venotomy, an 8 Fr peel-away sheath was placed over a guidewire. The catheter was then placed through the sheath and the sheath removed. Final catheter positioning was confirmed and documented with a fluoroscopic spot image. The port was accessed with a needle and aspirated and flushed with heparinized saline. The access needle was removed. The venotomy and port pocket incisions were closed with subcutaneous 3-0 Monocryl and subcuticular 4-0 Vicryl. Dermabond was applied to both incisions. COMPLICATIONS: COMPLICATIONS None FINDINGS: After catheter placement, the tip lies at the cavo-atrial junction. The catheter aspirates normally and is ready for immediate use. IMPRESSION: Placement of single lumen port a cath via left internal jugular vein. The catheter tip lies at the cavo-atrial junction. A power injectable port a cath was placed and is ready for immediate use. Electronically Signed   By: Aletta Edouard M.D.   On: 02/23/2020 12:28     ELIGIBLE FOR AVAILABLE RESEARCH PROTOCOL: AET  ASSESSMENT: 64 y.o. [place] woman status post right breast upper outer quadrant (12:00) and axillary lymph node biopsy 02/03/2020, both positive for a clinical T1c N1, stage IIB invasive ductal carcinoma, grade 3, estrogen receptor positive, HER-2 and progesterone receptor negative, with an MIB-1 of 60%.  (a) biopsy of a second right upper outer quadrant T2 lesion (11 o'clock) 02/06/2020 also showed invasive ductal carcinoma, but grade 2, estrogen receptor positive, progesterone receptor and HER-2 negative, with an MIB-1 of 15%  (b) biopsy of a third right breast lesion (1:00) was benign   (1) neoadjuvant chemotherapy will consist of cyclophosphamide and docetaxel every 21  days x 4 starting 03/05/2020  (2) definitive surgery to follow  (3) adjuvant radiation  (4) antiestrogens   PLAN:  [name] [name] is here for f/u after receiving her first cycle of neoadjuvant chemotherapy.  Overall she tolerated it moderately well.  Her CBC is stable, with her WBC demonstrating pegfilgrastim effect, and her CMP is normal.  I reviewed this with her in detail.    Her BP and weight have decreased.  I recommended that  she increase her fluid intake.  She does not have any symptoms of dehydration and knows to call if she develops these.  I am reassured that she is not tachycardic, she has MMM on exam, and is asymptomatic.    Joann has thrush from her chemotherapy.  I sent in Fluconazole 210m for her to take for 3-7 days until this resolves.  Once resolved, I instructed her to start the Fluconazole back on day one of cycle 2 of her chemotherapy.  I wrote these instructions down for her.    EMarikawill return back in 2 weeks for labs, f/u and her next treatment.  She knows to call for any questions that may arise between now and her next appointment.  We are happy to see her sooner if needed.  Total encounter time 30 minutes.*Wilber Bihari NP 03/14/20 9:05 AM Medical Oncology and Hematology [facility] [place] [id number]. [phone]   Fax. [phone]  *Total Encounter Time as defined by the Centers for Medicare and Medicaid Services includes, in addition to the face-to-face time of a patient visit (documented in the note above) non-face-to-face time: obtaining and reviewing outside history, ordering and reviewing medications, tests or procedures, care coordination (communications with other health care professionals or caregivers) and documentation in the medical record.

## 2020-03-19 ENCOUNTER — Telehealth: Payer: Self-pay | Admitting: Adult Health

## 2020-03-19 NOTE — Telephone Encounter (Signed)
Scheduled per 1/13 los. Pt will receive an updated appt calendar per next visit appt notes  

## 2020-03-26 ENCOUNTER — Encounter: Payer: Self-pay | Admitting: *Deleted

## 2020-03-26 ENCOUNTER — Inpatient Hospital Stay: Payer: Self-pay

## 2020-03-26 ENCOUNTER — Other Ambulatory Visit: Payer: Self-pay

## 2020-03-26 ENCOUNTER — Encounter: Payer: Self-pay | Admitting: Adult Health

## 2020-03-26 ENCOUNTER — Inpatient Hospital Stay (HOSPITAL_BASED_OUTPATIENT_CLINIC_OR_DEPARTMENT_OTHER): Payer: No Typology Code available for payment source | Admitting: Adult Health

## 2020-03-26 ENCOUNTER — Encounter: Payer: Self-pay | Admitting: Oncology

## 2020-03-26 VITALS — BP 154/80 | HR 74 | Temp 98.3°F | Resp 18 | Ht 66.0 in | Wt 223.2 lb

## 2020-03-26 DIAGNOSIS — C50411 Malignant neoplasm of upper-outer quadrant of right female breast: Secondary | ICD-10-CM

## 2020-03-26 DIAGNOSIS — Z170 Estrogen receptor positive status [ER+]: Secondary | ICD-10-CM

## 2020-03-26 DIAGNOSIS — Z95828 Presence of other vascular implants and grafts: Secondary | ICD-10-CM

## 2020-03-26 LAB — CBC WITH DIFFERENTIAL (CANCER CENTER ONLY)
Abs Immature Granulocytes: 0.08 10*3/uL — ABNORMAL HIGH (ref 0.00–0.07)
Basophils Absolute: 0 10*3/uL (ref 0.0–0.1)
Basophils Relative: 0 %
Eosinophils Absolute: 0 10*3/uL (ref 0.0–0.5)
Eosinophils Relative: 0 %
HCT: 35.3 % — ABNORMAL LOW (ref 36.0–46.0)
Hemoglobin: 11.5 g/dL — ABNORMAL LOW (ref 12.0–15.0)
Immature Granulocytes: 1 %
Lymphocytes Relative: 10 %
Lymphs Abs: 1 10*3/uL (ref 0.7–4.0)
MCH: 28.6 pg (ref 26.0–34.0)
MCHC: 32.6 g/dL (ref 30.0–36.0)
MCV: 87.8 fL (ref 80.0–100.0)
Monocytes Absolute: 0.6 10*3/uL (ref 0.1–1.0)
Monocytes Relative: 6 %
Neutro Abs: 8.9 10*3/uL — ABNORMAL HIGH (ref 1.7–7.7)
Neutrophils Relative %: 83 %
Platelet Count: 283 10*3/uL (ref 150–400)
RBC: 4.02 MIL/uL (ref 3.87–5.11)
RDW: 14.6 % (ref 11.5–15.5)
WBC Count: 10.6 10*3/uL — ABNORMAL HIGH (ref 4.0–10.5)
nRBC: 0 % (ref 0.0–0.2)

## 2020-03-26 LAB — CMP (CANCER CENTER ONLY)
ALT: 12 U/L (ref 0–44)
AST: 9 U/L — ABNORMAL LOW (ref 15–41)
Albumin: 3.6 g/dL (ref 3.5–5.0)
Alkaline Phosphatase: 67 U/L (ref 38–126)
Anion gap: 9 (ref 5–15)
BUN: 9 mg/dL (ref 8–23)
CO2: 24 mmol/L (ref 22–32)
Calcium: 9.1 mg/dL (ref 8.9–10.3)
Chloride: 108 mmol/L (ref 98–111)
Creatinine: 0.65 mg/dL (ref 0.44–1.00)
GFR, Estimated: >60 mL/min (ref >60)
Glucose, Bld: 108 mg/dL — ABNORMAL HIGH (ref 70–99)
Potassium: 3.6 mmol/L (ref 3.5–5.1)
Sodium: 141 mmol/L (ref 135–145)
Total Bilirubin: 0.7 mg/dL (ref 0.3–1.2)
Total Protein: 6 g/dL — ABNORMAL LOW (ref 6.5–8.1)

## 2020-03-26 MED ORDER — SODIUM CHLORIDE 0.9 % IV SOLN
10.0000 mg | Freq: Once | INTRAVENOUS | Status: AC
  Administered 2020-03-26: 10 mg via INTRAVENOUS
  Filled 2020-03-26: qty 10

## 2020-03-26 MED ORDER — SODIUM CHLORIDE 0.9% FLUSH
10.0000 mL | INTRAVENOUS | Status: DC | PRN
  Administered 2020-03-26: 10 mL
  Filled 2020-03-26: qty 10

## 2020-03-26 MED ORDER — SODIUM CHLORIDE 0.9 % IV SOLN
75.0000 mg/m2 | Freq: Once | INTRAVENOUS | Status: AC
  Administered 2020-03-26: 160 mg via INTRAVENOUS
  Filled 2020-03-26: qty 16

## 2020-03-26 MED ORDER — SODIUM CHLORIDE 0.9 % IV SOLN
600.0000 mg/m2 | Freq: Once | INTRAVENOUS | Status: AC
  Administered 2020-03-26: 1300 mg via INTRAVENOUS
  Filled 2020-03-26: qty 65

## 2020-03-26 MED ORDER — SODIUM CHLORIDE 0.9% FLUSH
10.0000 mL | Freq: Once | INTRAVENOUS | Status: AC
  Administered 2020-03-26: 10 mL
  Filled 2020-03-26: qty 10

## 2020-03-26 MED ORDER — HEPARIN SOD (PORK) LOCK FLUSH 100 UNIT/ML IV SOLN
500.0000 [IU] | Freq: Once | INTRAVENOUS | Status: AC | PRN
  Administered 2020-03-26: 500 [IU]
  Filled 2020-03-26: qty 5

## 2020-03-26 MED ORDER — PALONOSETRON HCL INJECTION 0.25 MG/5ML
INTRAVENOUS | Status: AC
  Filled 2020-03-26: qty 5

## 2020-03-26 MED ORDER — PALONOSETRON HCL INJECTION 0.25 MG/5ML
0.2500 mg | Freq: Once | INTRAVENOUS | Status: AC
  Administered 2020-03-26: 0.25 mg via INTRAVENOUS

## 2020-03-26 MED ORDER — SODIUM CHLORIDE 0.9 % IV SOLN
Freq: Once | INTRAVENOUS | Status: AC
  Filled 2020-03-26: qty 250

## 2020-03-26 NOTE — Patient Instructions (Signed)
[facility] Discharge Instructions for Patients Receiving Chemotherapy  Today you received the following chemotherapy agents Taxotere; Cytoxin  To help prevent nausea and vomiting after your treatment, we encourage you to take your nausea medication as directed   If you develop nausea and vomiting that is not controlled by your nausea medication, call the clinic.   BELOW ARE SYMPTOMS THAT SHOULD BE REPORTED IMMEDIATELY:  *FEVER GREATER THAN 100.5 F  *CHILLS WITH OR WITHOUT FEVER  NAUSEA AND VOMITING THAT IS NOT CONTROLLED WITH YOUR NAUSEA MEDICATION  *UNUSUAL SHORTNESS OF BREATH  *UNUSUAL BRUISING OR BLEEDING  TENDERNESS IN MOUTH AND THROAT WITH OR WITHOUT PRESENCE OF ULCERS  *URINARY PROBLEMS  *BOWEL PROBLEMS  UNUSUAL RASH Items with * indicate a potential emergency and should be followed up as soon as possible.  Feel free to call the clinic should you have any questions or concerns. The clinic phone number is [phone].  Please show the CHEMO ALERT CARD at check-in to the Emergency Department and triage nurse.   

## 2020-03-26 NOTE — Progress Notes (Signed)
[place]  [phone] [phone]     ID: [name] [name] DOB: [birth year]  MR#: [id number]  ESP#:[id number]  Patient Care Team: Patient, No Pcp Per as PCP - General (General Practice) Mauro Kaufmann, RN as Oncology Nurse Navigator Rockwell Germany, RN as Oncology Nurse Navigator Magrinat, Virgie Dad, MD as Consulting Physician (Oncology) Erroll Luna, MD as Consulting Physician (General Surgery) Eppie Gibson, MD as Attending Physician (Radiation Oncology) Scot Dock, NP OTHER MD:  CHIEF COMPLAINT: Estrogen receptor positive breast cancer  CURRENT TREATMENT: Neoadjuvant chemotherapy   INTERVAL HISTORY: [name] [name] returns today for follow up of her estrogen receptor positive breast cancer.   She is undergoing neoadjuvant chemotherapy with cyclophosphamide and docetaxel every 21 days for 4 cycles.  She receives growth factor support with Pegfilgrastim (or biosimilar) on day 3 of every cycle.  Today is cycle 2 day 1 of her treatment.    REVIEW OF SYSTEMS: [name] [name] is doing moderately well.  She has a mild intermittent peripheral neuropathy she notices more when she wakes up from sleeping.  It is in the very tips of the pads of her fingers, but she denies anything currently.  She has a mild dull 3/10 generalized headache.  She has intermittent ringing in the ears.  This has been persistent, but was present prior to her starting chemotherapy.  She says that it started about one year ago.      She had thrush at her last appt.  She was prescribed fluconazole and this has since resolved.  She celebrated her birthday yesterday.  She denies any other issues and a detailed ROS was otherwise non contributory.    HISTORY OF CURRENT ILLNESS: From the original intake note:  [name] [name] herself palpated a mass in the right axilla "a long time ago", more recently with complaints of associated pain. She brought it to medical attention and underwent bilateral  diagnostic mammography with tomography and bilateral breast ultrasonography at [facility] on 01/31/2020 showing: breast density category B; three right breast masses-- 1.9 cm at 12 o'clock, 2.3 cm at 11 o'clock, 0.4 cm at 1 o'clock; two adjacent enlarged thickened lymph nodes measuring up to 2.1 cm; no evidence of left breast malignancy.  Accordingly on 02/03/2020 she proceeded to biopsy of the right breast area at 12 o'clock and right axilla. The pathology from this procedure ([id number]) showed: invasive ductal carcinoma, grade 3. Prognostic indicators significant for: estrogen receptor, 100% positive with strong staining intensity and progesterone receptor, 0% negative. Proliferation marker Ki67 at 60%. HER2 equivocal by immunohistochemistry (2+), but negative by fluorescent in situ hybridization with a signals ratio 0.81 and number per cell 2.7.  The biopsied lymph node was found to show metastatic carcinoma. The morphology was considered different from the biopsied mass, and a prognostic panel was performed. Estrogen receptor, 100% positive with strong staining intensity and progesterone receptor, 0% negative. Proliferation marker Ki67 at 70%. HER2 equivcoal by immunohistochemistry (2+), but negative) by fluorescent in situ hybridization with a signals ratio 1.16 and number per cell 3.9.  She underwent additional right breast biopsies on 02/06/2020. Pathology [id number]) from the mass at 11 o'clock showed: invasive ductal carcinoma, grade 2. Prognostic indicators significant for: estrogen receptor, 100% positive with strong staining intensity and progesterone receptor, 0% negative. Proliferation marker Ki67 at 15%. HER2 equivocal by immunohistochemistry (2+), but negative by fluorescent in situ hybridization with a signals ratio 1.29 and number per cell 2.7.  The biopsied mass at 1 o'clock showed only  fibrocystic change.  The patient's subsequent history is as detailed below.   PAST MEDICAL  HISTORY: Past Medical History:  Diagnosis Date  . Arthritis   . Diabetes mellitus without complication ([place])   . Hypertension     PAST SURGICAL HISTORY: Past Surgical History:  Procedure Laterality Date  . IR IMAGING GUIDED PORT INSERTION  02/23/2020    FAMILY HISTORY: Family History  Problem Relation Age of Onset  . Asthma Mother   . Arthritis Sister   . Asthma Brother   . Arthritis Brother   [name] died in his 63s from causes unknown to the patient.  [name] died from heart disease at age 75.  The patient had 4 brothers, 1 sister, with no history of cancer in the family to her knowledge   GYNECOLOGIC HISTORY:  No LMP recorded. Patient is postmenopausal. Menarche: 64 years old Age at first live birth: 64 years old [place] P 1 LMP 63s Contraceptive HRT no  Hysterectomy? no BSO? no   SOCIAL HISTORY: (updated 02/2020)  [name] [name] is originally from [place].  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  She describes herself as single.  At home she lives with her daughter [name] [name] who works for [employer], her daughters husband [name] [name] who is a English as a second language teacher, and their children who are 6 months old and 51 years old.  The patient is in [place]: Not in place.  The patient tells me she would intend to name her daughter [name] [name] as her healthcare power of attorney   HEALTH MAINTENANCE: Social History   Tobacco Use  . Smoking status: Never Smoker  . Smokeless tobacco: Never Used  Vaping Use  . Vaping Use: Never used  Substance Use Topics  . Alcohol use: Yes    Alcohol/week: 1.0 standard drink    Types: 1 Glasses of wine per week    Comment: occasionally  . Drug use: Never     Colonoscopy: never done  PAP: 2017 (performed in [place])  Bone density:    Allergies  Allergen Reactions  . Sulfa Antibiotics Itching    Current Outpatient Medications  Medication Sig Dispense Refill  . amLODipine (NORVASC) 5  MG tablet Take 5 mg by mouth daily.    Marland Kitchen atorvastatin (LIPITOR) 40 MG tablet Take 40 mg by mouth daily.    . carvedilol (COREG) 6.25 MG tablet Take 6.25 mg by mouth 2 (two) times daily with a meal.    . dexamethasone (DECADRON) 4 MG tablet Take 2 tablets (8 mg total) by mouth 2 (two) times daily. Start the day before Taxotere. Then again the day after chemo for 3 days. 30 tablet 1  . lansoprazole (PREVACID) 30 MG capsule Take 30 mg by mouth every morning.    . lidocaine-prilocaine (EMLA) cream Apply to affected area once 30 g 3  . LORazepam (ATIVAN) 0.5 MG tablet Take 1 tablet (0.5 mg total) by mouth at bedtime as needed (Nausea or vomiting). 30 tablet 0  . losartan (COZAAR) 100 MG tablet Take 100 mg by mouth daily.    . metFORMIN (GLUCOPHAGE) 1000 MG tablet Take 1,000 mg by mouth daily with breakfast.     . prochlorperazine (COMPAZINE) 10 MG tablet Take 1 tablet (10 mg total) by mouth 4 (four) times daily -  before meals and at bedtime. Start the evening of chemotherapy and continue for 2 days, then take as needed. 30 tablet 1  .  ASPIRIN 81 81 MG chewable tablet Chew 1 tablet by mouth daily. (Patient not taking: Reported on 03/26/2020)    . celecoxib (CELEBREX) 200 MG capsule Take 200 mg by mouth daily.     . fluconazole (DIFLUCAN) 200 MG tablet Take 1 tablet (200 mg total) by mouth daily. (Patient not taking: Reported on 03/26/2020) 30 tablet 0   No current facility-administered medications for this visit.    OBJECTIVE:   Vitals:   03/26/20 1029  BP: (!) 154/80  Pulse: 74  Resp: 18  Temp: 98.3 F (36.8 C)  SpO2: 100%     Body mass index is 36.03 kg/m.   Wt Readings from Last 3 Encounters:  03/26/20 223 lb 3.2 oz (101.2 kg)  03/14/20 211 lb 14.4 oz (96.1 kg)  03/05/20 219 lb 12.8 oz (99.7 kg)      ECOG FS:2 - Symptomatic, <50% confined to bed GENERAL: Patient is a well appearing female in no acute distress HEENT:  Sclerae anicteric.  MMM, no oropharyngeal ulcerations or candida  noted. Neck is supple.  NODES:  No cervical, supraclavicular, or axillary lymphadenopathy palpated.  BREAST EXAM:  Deferred. LUNGS:  Clear to auscultation bilaterally.  No wheezes or rhonchi. HEART:  Regular rate and rhythm. No murmur appreciated. ABDOMEN:  Soft, nontender.  Positive, normoactive bowel sounds. No organomegaly palpated. MSK:  No focal spinal tenderness to palpation. Full range of motion bilaterally in the upper extremities. EXTREMITIES:  No peripheral edema.   SKIN:  Clear with no obvious rashes or skin changes. No nail dyscrasia. NEURO:  Nonfocal. Well oriented.  Appropriate affect.     LAB RESULTS:  CMP     Component Value Date/Time   NA 138 03/14/2020 0756   K 3.8 03/14/2020 0756   CL 103 03/14/2020 0756   CO2 25 03/14/2020 0756   GLUCOSE 98 03/14/2020 0756   BUN 9 03/14/2020 0756   CREATININE 0.83 03/14/2020 0756   CALCIUM 8.9 03/14/2020 0756   PROT 5.9 (L) 03/14/2020 0756   ALBUMIN 3.5 03/14/2020 0756   AST 13 (L) 03/14/2020 0756   ALT 17 03/14/2020 0756   ALKPHOS 86 03/14/2020 0756   BILITOT 0.4 03/14/2020 0756   GFRNONAA >60 03/14/2020 0756    No results found for: TOTALPROTELP, ALBUMINELP, A1GS, A2GS, BETS, BETA2SER, GAMS, MSPIKE, SPEI  Lab Results  Component Value Date   WBC 10.6 (H) 03/26/2020   NEUTROABS 8.9 (H) 03/26/2020   HGB 11.5 (L) 03/26/2020   HCT 35.3 (L) 03/26/2020   MCV 87.8 03/26/2020   PLT 283 03/26/2020    No results found for: LABCA2  No components found for: [id number]  No results for input(s): INR in the last 168 hours.  No results found for: LABCA2  No results found for: EPP295  No results found for: JOA416  No results found for: SAY301  No results found for: CA2729  No components found for: HGQUANT  No results found for: CEA1 / No results found for: CEA1   No results found for: AFPTUMOR  No results found for: CHROMOGRNA  No results found for: KPAFRELGTCHN, LAMBDASER, KAPLAMBRATIO (kappa/lambda light  chains)  No results found for: HGBA, HGBA2QUANT, HGBFQUANT, HGBSQUAN (Hemoglobinopathy evaluation)   No results found for: LDH  No results found for: IRON, TIBC, IRONPCTSAT (Iron and TIBC)  No results found for: FERRITIN  Urinalysis No results found for: COLORURINE, APPEARANCEUR, LABSPEC, PHURINE, GLUCOSEU, HGBUR, BILIRUBINUR, KETONESUR, PROTEINUR, UROBILINOGEN, NITRITE, LEUKOCYTESUR   STUDIES: No results found.   ELIGIBLE FOR AVAILABLE  RESEARCH PROTOCOL: AET  ASSESSMENT: 64 y.o. [place] woman status post right breast upper outer quadrant (12:00) and axillary lymph node biopsy 02/03/2020, both positive for a clinical T1c N1, stage IIB invasive ductal carcinoma, grade 3, estrogen receptor positive, HER-2 and progesterone receptor negative, with an MIB-1 of 60%.  (a) biopsy of a second right upper outer quadrant T2 lesion (11 o'clock) 02/06/2020 also showed invasive ductal carcinoma, but grade 2, estrogen receptor positive, progesterone receptor and HER-2 negative, with an MIB-1 of 15%  (b) biopsy of a third right breast lesion (1:00) was benign   (1) neoadjuvant chemotherapy will consist of cyclophosphamide and docetaxel every 21 days x 4 starting 03/05/2020  (2) definitive surgery to follow  (3) adjuvant radiation  (4) antiestrogens   PLAN:  [name] [name] returns today to receive cycle 2 of her neoadjuvant docetaxel and cyclophosphamide.  Her CBC is stable thus far, we are awaiting her CMET.  She will proceed with treatment so long as her CMET is within parameters.    She has a very mild intermittent peripheral neuropathy.  This is not constant, and only present usually in the evenings after sleeping which makes you think it is ? Positional.  We will monitor this closely and I instructed her to pay attention to how often she has it and if she has any issues related to it.    The ringing of the ears is a chronic problem.  I think she would likely benefit from MRI, however we will  monitor her during chemotherapy and may be able to get the MRI after she completes her treatment.  She has no hearing change and it has been no worse than it was previously.    [name] [name] will return in 2 days for her injection and in one week for labs and f/u.  She knows to call for any questions that may arise between now and her next appointment.  We are happy to see her sooner if needed.  Total encounter time 30 minutes.Wilber Bihari, NP 03/26/20 10:45 AM Medical Oncology and Hematology [facility] [place], [place] [address] Tel. [phone]    Fax. [phone]   *Total Encounter Time as defined by the Centers for Medicare and Medicaid Services includes, in addition to the face-to-face time of a patient visit (documented in the note above) non-face-to-face time: obtaining and reviewing outside history, ordering and reviewing medications, tests or procedures, care coordination (communications with other health care professionals or caregivers) and documentation in the medical record.

## 2020-03-27 ENCOUNTER — Telehealth: Payer: Self-pay | Admitting: Adult Health

## 2020-03-27 NOTE — Telephone Encounter (Signed)
No 11/15 los. No changes made to pt's schedule.

## 2020-03-28 ENCOUNTER — Other Ambulatory Visit: Payer: Self-pay

## 2020-03-28 ENCOUNTER — Inpatient Hospital Stay: Payer: Self-pay

## 2020-03-28 VITALS — BP 143/87 | HR 78 | Resp 18

## 2020-03-28 DIAGNOSIS — Z170 Estrogen receptor positive status [ER+]: Secondary | ICD-10-CM

## 2020-03-28 MED ORDER — PEGFILGRASTIM-CBQV 6 MG/0.6ML ~~LOC~~ SOSY
PREFILLED_SYRINGE | SUBCUTANEOUS | Status: AC
  Filled 2020-03-28: qty 0.6

## 2020-03-28 MED ORDER — PEGFILGRASTIM-CBQV 6 MG/0.6ML ~~LOC~~ SOSY
6.0000 mg | PREFILLED_SYRINGE | Freq: Once | SUBCUTANEOUS | Status: AC
  Administered 2020-03-28: 6 mg via SUBCUTANEOUS

## 2020-03-30 ENCOUNTER — Other Ambulatory Visit: Payer: Self-pay

## 2020-03-30 DIAGNOSIS — C50411 Malignant neoplasm of upper-outer quadrant of right female breast: Secondary | ICD-10-CM

## 2020-04-02 ENCOUNTER — Inpatient Hospital Stay (HOSPITAL_BASED_OUTPATIENT_CLINIC_OR_DEPARTMENT_OTHER): Payer: Self-pay | Admitting: Adult Health

## 2020-04-02 ENCOUNTER — Other Ambulatory Visit: Payer: Self-pay

## 2020-04-02 ENCOUNTER — Inpatient Hospital Stay: Payer: Self-pay

## 2020-04-02 ENCOUNTER — Ambulatory Visit (HOSPITAL_COMMUNITY)
Admission: RE | Admit: 2020-04-02 | Discharge: 2020-04-02 | Disposition: A | Discharge status: Home / Self Care | Payer: Self-pay | Source: Ambulatory Visit | Attending: Adult Health | Admitting: Adult Health

## 2020-04-02 ENCOUNTER — Encounter: Payer: Self-pay | Admitting: Adult Health

## 2020-04-02 VITALS — BP 130/64 | HR 75 | Temp 97.7°F | Resp 18 | Ht 66.0 in | Wt 220.0 lb

## 2020-04-02 DIAGNOSIS — C50411 Malignant neoplasm of upper-outer quadrant of right female breast: Secondary | ICD-10-CM | POA: Insufficient documentation

## 2020-04-02 DIAGNOSIS — Z170 Estrogen receptor positive status [ER+]: Secondary | ICD-10-CM

## 2020-04-02 LAB — CBC WITH DIFFERENTIAL (CANCER CENTER ONLY)
Abs Immature Granulocytes: 0.09 10*3/uL — ABNORMAL HIGH (ref 0.00–0.07)
Basophils Absolute: 0 10*3/uL (ref 0.0–0.1)
Basophils Relative: 1 %
Eosinophils Absolute: 0 10*3/uL (ref 0.0–0.5)
Eosinophils Relative: 0 %
HCT: 35.8 % — ABNORMAL LOW (ref 36.0–46.0)
Hemoglobin: 11.6 g/dL — ABNORMAL LOW (ref 12.0–15.0)
Immature Granulocytes: 2 %
Lymphocytes Relative: 18 %
Lymphs Abs: 0.7 10*3/uL (ref 0.7–4.0)
MCH: 28.9 pg (ref 26.0–34.0)
MCHC: 32.4 g/dL (ref 30.0–36.0)
MCV: 89.3 fL (ref 80.0–100.0)
Monocytes Absolute: 0.6 10*3/uL (ref 0.1–1.0)
Monocytes Relative: 15 %
Neutro Abs: 2.5 10*3/uL (ref 1.7–7.7)
Neutrophils Relative %: 64 %
Platelet Count: 273 10*3/uL (ref 150–400)
RBC: 4.01 MIL/uL (ref 3.87–5.11)
RDW: 14.6 % (ref 11.5–15.5)
WBC Count: 3.9 10*3/uL — ABNORMAL LOW (ref 4.0–10.5)
nRBC: 0 % (ref 0.0–0.2)

## 2020-04-02 LAB — CMP (CANCER CENTER ONLY)
ALT: 14 U/L (ref 0–44)
AST: 10 U/L — ABNORMAL LOW (ref 15–41)
Albumin: 3.5 g/dL (ref 3.5–5.0)
Alkaline Phosphatase: 73 U/L (ref 38–126)
Anion gap: 7 (ref 5–15)
BUN: 11 mg/dL (ref 8–23)
CO2: 26 mmol/L (ref 22–32)
Calcium: 9.3 mg/dL (ref 8.9–10.3)
Chloride: 105 mmol/L (ref 98–111)
Creatinine: 0.76 mg/dL (ref 0.44–1.00)
GFR, Estimated: >60 mL/min (ref >60)
Glucose, Bld: 158 mg/dL — ABNORMAL HIGH (ref 70–99)
Potassium: 4.1 mmol/L (ref 3.5–5.1)
Sodium: 138 mmol/L (ref 135–145)
Total Bilirubin: 0.6 mg/dL (ref 0.3–1.2)
Total Protein: 6.1 g/dL — ABNORMAL LOW (ref 6.5–8.1)

## 2020-04-02 MED ORDER — DEXAMETHASONE 4 MG PO TABS: ORAL_TABLET | ORAL | 0 refills | Status: DC

## 2020-04-02 NOTE — Progress Notes (Signed)
Left upper extremity venous duplex has been completed. Preliminary results can be found in CV Proc through chart review.  Results were given to Wilber Bihari NP.  04/02/20 10:58 AM [name] [name] RVT

## 2020-04-02 NOTE — Patient Instructions (Signed)
Take Tylenol 500mg  three times a day as needed for pain.    Take Dexamethasone 4mg  daily x 5 days after you finish your first 3 days of after chemotherapy anti nausea Dexamethasone directions.

## 2020-04-02 NOTE — Progress Notes (Signed)
[place]  [phone] [phone]     ID: [name] [name] DOB: 1955/07/27  MR#: [id number]  VOJ#:[id number]  Patient Care Team: Patient, No Pcp Per as PCP - General (General Practice) [name] Kaufmann, RN as Oncology Nurse Navigator [name] Germany, RN as Oncology Nurse Navigator [name] [name], [name] Dad, MD as Consulting Physician (Oncology) [name] Luna, MD as Consulting Physician (General Surgery) [name] Gibson, MD as Attending Physician (Radiation Oncology) [name] Dock, NP OTHER MD:  CHIEF COMPLAINT: Estrogen receptor positive breast cancer  CURRENT TREATMENT: Neoadjuvant chemotherapy   INTERVAL HISTORY: [name] [name] returns today for follow up of her estrogen receptor positive breast cancer.   She is undergoing neoadjuvant chemotherapy with cyclophosphamide and docetaxel every 21 days for 4 cycles.  She receives growth factor support with Pegfilgrastim (or biosimilar) on day 3 of every cycle.  Today is cycle 2 day 8 of her treatment.    REVIEW OF SYSTEMS: [name] [name] is feeling tired and her bones ache since she received her growth factor injection.  She denies any new issues such as fever or chills.  She notes some left arm swelling.  She has mild intermittent peripheral neuropathy, that she notices while sleeping at night.  She is fatigued, and notes that she is having difficulty with this.    She has no cough, shortness of breath, mucositis, nausea, vomiting, bowel/bladder changes, headaches, or any other concerns today.  She notes she is eating and drinking well.  A detailed ROS was otherwise non contributory.    HISTORY OF CURRENT ILLNESS: From the original intake note:  [name] [name] herself palpated a mass in the right axilla "a long time ago", more recently with complaints of associated pain. She brought it to medical attention and underwent bilateral diagnostic mammography with tomography and bilateral breast ultrasonography at [facility] on  01/31/2020 showing: breast density category B; three right breast masses-- 1.9 cm at 12 o'clock, 2.3 cm at 11 o'clock, 0.4 cm at 1 o'clock; two adjacent enlarged thickened lymph nodes measuring up to 2.1 cm; no evidence of left breast malignancy.  Accordingly on 02/03/2020 she proceeded to biopsy of the right breast area at 12 o'clock and right axilla. The pathology from this procedure ([id number]) showed: invasive ductal carcinoma, grade 3. Prognostic indicators significant for: estrogen receptor, 100% positive with strong staining intensity and progesterone receptor, 0% negative. Proliferation marker Ki67 at 60%. HER2 equivocal by immunohistochemistry (2+), but negative by fluorescent in situ hybridization with a signals ratio 0.81 and number per cell 2.7.  The biopsied lymph node was found to show metastatic carcinoma. The morphology was considered different from the biopsied mass, and a prognostic panel was performed. Estrogen receptor, 100% positive with strong staining intensity and progesterone receptor, 0% negative. Proliferation marker Ki67 at 70%. HER2 equivcoal by immunohistochemistry (2+), but negative) by fluorescent in situ hybridization with a signals ratio 1.16 and number per cell 3.9.  She underwent additional right breast biopsies on 02/06/2020. Pathology [id number]) from the mass at 11 o'clock showed: invasive ductal carcinoma, grade 2. Prognostic indicators significant for: estrogen receptor, 100% positive with strong staining intensity and progesterone receptor, 0% negative. Proliferation marker Ki67 at 15%. HER2 equivocal by immunohistochemistry (2+), but negative by fluorescent in situ hybridization with a signals ratio 1.29 and number per cell 2.7.  The biopsied mass at 1 o'clock showed only fibrocystic change.  The patient's subsequent history is as detailed below.   PAST MEDICAL HISTORY: Past Medical History:  Diagnosis Date  .  Arthritis   . Diabetes mellitus without  complication ([place])   . Hypertension     PAST SURGICAL HISTORY: Past Surgical History:  Procedure Laterality Date  . IR IMAGING GUIDED PORT INSERTION  02/23/2020    FAMILY HISTORY: Family History  Problem Relation Age of Onset  . Asthma Mother   . Arthritis Sister   . Asthma Brother   . Arthritis Brother   [name] died in his 31s from causes unknown to the patient.  [name] died from heart disease at age 53.  The patient had 4 brothers, 1 sister, with no history of cancer in the family to her knowledge   GYNECOLOGIC HISTORY:  No LMP recorded. Patient is postmenopausal. Menarche: 64 years old Age at first live birth: 64 years old [name] [name] 1 LMP 65s Contraceptive HRT no  Hysterectomy? no BSO? no   SOCIAL HISTORY: (updated 02/2020)  [name] [name] is originally from [place].  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  She describes herself as single.  At home she lives with her daughter [name] [name] who works for [employer], her daughters husband [name] [name] who is a English as a second language teacher, and their children who are 32 months old and 77 years old.  The patient is in [place]: Not in place.  The patient tells me she would intend to name her daughter [name] [name] as her healthcare power of attorney   HEALTH MAINTENANCE: Social History   Tobacco Use  . Smoking status: Never Smoker  . Smokeless tobacco: Never Used  Vaping Use  . Vaping Use: Never used  Substance Use Topics  . Alcohol use: Yes    Alcohol/week: 1.0 standard drink    Types: 1 Glasses of wine per week    Comment: occasionally  . Drug use: Never     Colonoscopy: never done  PAP: 2017 (performed in [place])  Bone density:    Allergies  Allergen Reactions  . Sulfa Antibiotics Itching    Current Outpatient Medications  Medication Sig Dispense Refill  . amLODipine (NORVASC) 5 MG tablet Take 5 mg by mouth daily.    . ASPIRIN 81 81 MG chewable tablet Chew 1 tablet by  mouth daily. (Patient not taking: Reported on 03/26/2020)    . atorvastatin (LIPITOR) 40 MG tablet Take 40 mg by mouth daily.    . carvedilol (COREG) 6.25 MG tablet Take 6.25 mg by mouth 2 (two) times daily with a meal.    . celecoxib (CELEBREX) 200 MG capsule Take 200 mg by mouth daily.     Marland Kitchen dexamethasone (DECADRON) 4 MG tablet Take 2 tablets (8 mg total) by mouth 2 (two) times daily. Start the day before Taxotere. Then again the day after chemo for 3 days. 30 tablet 1  . fluconazole (DIFLUCAN) 200 MG tablet Take 1 tablet (200 mg total) by mouth daily. (Patient not taking: Reported on 03/26/2020) 30 tablet 0  . lansoprazole (PREVACID) 30 MG capsule Take 30 mg by mouth every morning.    . lidocaine-prilocaine (EMLA) cream Apply to affected area once 30 g 3  . LORazepam (ATIVAN) 0.5 MG tablet Take 1 tablet (0.5 mg total) by mouth at bedtime as needed (Nausea or vomiting). 30 tablet 0  . losartan (COZAAR) 100 MG tablet Take 100 mg by mouth daily.    . metFORMIN (GLUCOPHAGE) 1000 MG tablet Take 1,000 mg by mouth daily with breakfast.     . prochlorperazine (COMPAZINE) 10 MG  tablet Take 1 tablet (10 mg total) by mouth 4 (four) times daily -  before meals and at bedtime. Start the evening of chemotherapy and continue for 2 days, then take as needed. 30 tablet 1   No current facility-administered medications for this visit.    OBJECTIVE:   Vitals:   04/02/20 0941  BP: 130/64  Pulse: 75  Resp: 18  Temp: 97.7 F (36.5 C)  SpO2: 100%     Body mass index is 35.51 kg/m.   Wt Readings from Last 3 Encounters:  04/02/20 220 lb (99.8 kg)  03/26/20 223 lb 3.2 oz (101.2 kg)  03/14/20 211 lb 14.4 oz (96.1 kg)      ECOG FS:2 - Symptomatic, <50% confined to bed GENERAL: Patient is a well appearing female in no acute distress HEENT:  Sclerae anicteric.  MMM, no oropharyngeal ulcerations or candida noted. Neck is supple.  NODES:  No cervical, supraclavicular, or axillary lymphadenopathy palpated.    BREAST EXAM:  Softer, no progression noted in right breast/axillary mass LUNGS:  Clear to auscultation bilaterally.  No wheezes or rhonchi. HEART:  Regular rate and rhythm. No murmur appreciated. ABDOMEN:  Soft, nontender.  Positive, normoactive bowel sounds. No organomegaly palpated. MSK:  No focal spinal tenderness to palpation. Full range of motion bilaterally in the upper extremities. EXTREMITIES:  No peripheral edema.  Slight swelling in left arm noted, port is on left side SKIN:  Clear with no obvious rashes or skin changes. No nail dyscrasia. NEURO:  Nonfocal. Well oriented.  Appropriate affect.     LAB RESULTS:  CMP     Component Value Date/Time   NA 138 04/02/2020 0900   K 4.1 04/02/2020 0900   CL 105 04/02/2020 0900   CO2 26 04/02/2020 0900   GLUCOSE 158 (H) 04/02/2020 0900   BUN 11 04/02/2020 0900   CREATININE 0.76 04/02/2020 0900   CALCIUM 9.3 04/02/2020 0900   PROT 6.1 (L) 04/02/2020 0900   ALBUMIN 3.5 04/02/2020 0900   AST 10 (L) 04/02/2020 0900   ALT 14 04/02/2020 0900   ALKPHOS 73 04/02/2020 0900   BILITOT 0.6 04/02/2020 0900   GFRNONAA >60 04/02/2020 0900    No results found for: TOTALPROTELP, ALBUMINELP, A1GS, A2GS, BETS, BETA2SER, GAMS, MSPIKE, SPEI  Lab Results  Component Value Date   WBC 3.9 (L) 04/02/2020   NEUTROABS 2.5 04/02/2020   HGB 11.6 (L) 04/02/2020   HCT 35.8 (L) 04/02/2020   MCV 89.3 04/02/2020   PLT 273 04/02/2020    No results found for: LABCA2  No components found for: FGHWEX937  No results for input(s): INR in the last 168 hours.  No results found for: LABCA2  No results found for: JIR678  No results found for: LFY101  No results found for: BPZ025  No results found for: CA2729  No components found for: HGQUANT  No results found for: CEA1 / No results found for: CEA1   No results found for: AFPTUMOR  No results found for: CHROMOGRNA  No results found for: KPAFRELGTCHN, LAMBDASER, KAPLAMBRATIO (kappa/lambda  light chains)  No results found for: HGBA, HGBA2QUANT, HGBFQUANT, HGBSQUAN (Hemoglobinopathy evaluation)   No results found for: LDH  No results found for: IRON, TIBC, IRONPCTSAT (Iron and TIBC)  No results found for: FERRITIN  Urinalysis No results found for: COLORURINE, APPEARANCEUR, LABSPEC, PHURINE, GLUCOSEU, HGBUR, BILIRUBINUR, KETONESUR, PROTEINUR, UROBILINOGEN, NITRITE, LEUKOCYTESUR   STUDIES: No results found.   ELIGIBLE FOR AVAILABLE RESEARCH PROTOCOL: AET  ASSESSMENT: 64 y.o. [place] woman  status post right breast upper outer quadrant (12:00) and axillary lymph node biopsy 02/03/2020, both positive for a clinical T1c N1, stage IIB invasive ductal carcinoma, grade 3, estrogen receptor positive, HER-2 and progesterone receptor negative, with an MIB-1 of 60%.  (a) biopsy of a second right upper outer quadrant T2 lesion (11 o'clock) 02/06/2020 also showed invasive ductal carcinoma, but grade 2, estrogen receptor positive, progesterone receptor and HER-2 negative, with an MIB-1 of 15%  (b) biopsy of a third right breast lesion (1:00) was benign   (1) neoadjuvant chemotherapy will consist of cyclophosphamide and docetaxel every 21 days x 4 starting 03/05/2020  (2) definitive surgery to follow  (3) adjuvant radiation  (4) antiestrogens   PLAN:  [name] [name] returns today for f/u after receiving her second cycle of neoadjuvant chemotherapy.  She notes her mass is improving, which is consistent with clinical response to treatment.  This is favorable.  Her labs are stable today and she is not neutropenic.  I reviewed her labs with her in detail.    I recommended that she continue taking the Tylenol TID PRN for her pain in addition to the celebrex she already takes daily.  This is likely secondary to her growth factor injection and should resolve in a couple of days.  Due to her fatigue I recommended she take Dexamethasone 83m daily x 5 days following her BID dosing after  chemotherapy.  I updated this in her prescription.    She will undergo a left arm doppler to evaluate and r/o thrombus in her left upper extremity since she does have a port on that side.  She will return to clinic if it is positive.    [name] [name] return in 2 weeks for labs, f/u, and her next treatment.  She knows to call for any questions that may arise between now and her next appointment.  We are happy to see her sooner if needed.   Total encounter time 30 minutes.*Wilber Bihari NP 04/02/20 10:12 AM [facility] On[facility] [place] [id number]. [phone]   Fax. [phone]  *Total Encounter Time as defined by the Centers for Medicare and Medicaid Services includes, in addition to the face-to-face time of a patient visit (documented in the note above) non-face-to-face time: obtaining and reviewing outside history, ordering and reviewing medications, tests or procedures, care coordination (communications with other health care professionals or caregivers) and documentation in the medical record.

## 2020-04-03 ENCOUNTER — Telehealth: Payer: Self-pay | Admitting: Adult Health

## 2020-04-03 NOTE — Telephone Encounter (Signed)
No 11/22 los. No changes made to pt's schedule.

## 2020-04-10 ENCOUNTER — Telehealth: Payer: Self-pay

## 2020-04-10 NOTE — Telephone Encounter (Signed)
Pt's daughter called stating pt has tooth ache and facial swelling. This LPN advised pt to see dentist. [name] (daughter) explained that pt would need clearance from [place] for pt to be seen. This LPN spoke with Sandi Mealy, PA and reviewed pt's last tx and labs, Lucianne Lei says OK for pt to be seen by dentist and tooth extraction if needed. [facility] will go to [facility], and Mercedes from [facility] knows to fax [place] a release form if pt needs intervention. [name] is aware of all and is making appt for pt today.

## 2020-04-15 NOTE — Progress Notes (Signed)
[place]  [phone] [phone]     ID: [name] [name] DOB: [birth year]  MR#: [id number]  ACZ#:[id number]  Patient Care Team: Patient, No Pcp Per as PCP - General (General Practice) Mauro Kaufmann, RN as Oncology Nurse Navigator Rockwell Germany, RN as Oncology Nurse Navigator Nixon Sparr, Virgie Dad, MD as Consulting Physician (Oncology) Erroll Luna, MD as Consulting Physician (General Surgery) Eppie Gibson, MD as Attending Physician (Radiation Oncology) Chauncey Cruel, MD OTHER MD:  CHIEF COMPLAINT: Estrogen receptor positive breast cancer  CURRENT TREATMENT: Neoadjuvant chemotherapy   INTERVAL HISTORY: [name] [name] returns today for follow up and treatment of her estrogen receptor positive breast cancer.   She is undergoing neoadjuvant chemotherapy with cyclophosphamide and docetaxel every 21 days for 4 cycles.  She receives growth factor support with Pegfilgrastim (or biosimilar) on day 3 of every cycle.  Today is cycle 3 day 1 of her treatment.    Since her last visit, she underwent left upper extremity venous ultrasound on 04/02/2020 showing no evidence of deep vein or superficial vein thrombosis.   REVIEW OF SYSTEMS: [name] [name] tells me she had mild headaches, a slight nosebleed, a little bit of blood in her tissue after a bowel movement, and some palpitations over the weekend.  It was "not so bad".  However when asked specifically about numbness or tingling in her fingers or toes she says she did have some numbness in her fingertips.  I made sure this was not over the nailbeds (which incidentally are darkening) but actually on the pads of the fingers.  She has none this minute.  However she says her toes are little bit numb in both feet.  A detailed review of systems is otherwise stable.   COVID 19 VACCINATION STATUS:    HISTORY OF CURRENT ILLNESS: From the original intake note:  [name] [name] herself palpated a mass in the right axilla "a long  time ago", more recently with complaints of associated pain. She brought it to medical attention and underwent bilateral diagnostic mammography with tomography and bilateral breast ultrasonography at [facility] on 01/31/2020 showing: breast density category B; three right breast masses-- 1.9 cm at 12 o'clock, 2.3 cm at 11 o'clock, 0.4 cm at 1 o'clock; two adjacent enlarged thickened lymph nodes measuring up to 2.1 cm; no evidence of left breast malignancy.  Accordingly on 02/03/2020 she proceeded to biopsy of the right breast area at 12 o'clock and right axilla. The pathology from this procedure ([id number]) showed: invasive ductal carcinoma, grade 3. Prognostic indicators significant for: estrogen receptor, 100% positive with strong staining intensity and progesterone receptor, 0% negative. Proliferation marker Ki67 at 60%. HER2 equivocal by immunohistochemistry (2+), but negative by fluorescent in situ hybridization with a signals ratio 0.81 and number per cell 2.7.  The biopsied lymph node was found to show metastatic carcinoma. The morphology was considered different from the biopsied mass, and a prognostic panel was performed. Estrogen receptor, 100% positive with strong staining intensity and progesterone receptor, 0% negative. Proliferation marker Ki67 at 70%. HER2 equivcoal by immunohistochemistry (2+), but negative) by fluorescent in situ hybridization with a signals ratio 1.16 and number per cell 3.9.  She underwent additional right breast biopsies on 02/06/2020. Pathology [id number] [id number]) from the mass at 11 o'clock showed: invasive ductal carcinoma, grade 2. Prognostic indicators significant for: estrogen receptor, 100% positive with strong staining intensity and progesterone receptor, 0% negative. Proliferation marker Ki67 at 15%. HER2 equivocal by immunohistochemistry (2+), but negative by fluorescent in situ hybridization  with a signals ratio 1.29 and number per cell 2.7.  The biopsied mass at  1 o'clock showed only fibrocystic change.  The patient's subsequent history is as detailed below.   PAST MEDICAL HISTORY: Past Medical History:  Diagnosis Date  . Arthritis   . Diabetes mellitus without complication ([place])   . Hypertension     PAST SURGICAL HISTORY: Past Surgical History:  Procedure Laterality Date  . IR IMAGING GUIDED PORT INSERTION  02/23/2020    FAMILY HISTORY: Family History  Problem Relation Age of Onset  . Asthma Mother   . Arthritis Sister   . Asthma Brother   . Arthritis Brother   [name] died in his 68s from causes unknown to the patient.  [name] died from heart disease at age 8.  The patient had 4 brothers, 1 sister, with no history of cancer in the family to her knowledge   GYNECOLOGIC HISTORY:  No LMP recorded. Patient is postmenopausal. Menarche: 64 years old Age at first live birth: 64 years old [place] P 1 LMP 63s Contraceptive HRT no  Hysterectomy? no BSO? no   SOCIAL HISTORY: (updated 02/2020)  [name] [name] is originally from [place].  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  She describes herself as single.  At home she lives with her daughter [name] [name] who works for [employer], her daughters husband [name] [name] who is a English as a second language teacher, and their children who are 39 months old and 53 years old.  The patient is in [place]: Not in place.  The patient tells me she would intend to name her daughter [name] [name] as her healthcare power of attorney   HEALTH MAINTENANCE: Social History   Tobacco Use  . Smoking status: Never Smoker  . Smokeless tobacco: Never Used  Vaping Use  . Vaping Use: Never used  Substance Use Topics  . Alcohol use: Yes    Alcohol/week: 1.0 standard drink    Types: 1 Glasses of wine per week    Comment: occasionally  . Drug use: Never     Colonoscopy: never done  PAP: 2017 (performed in [place])  Bone density:    Allergies  Allergen Reactions  .  Sulfa Antibiotics Itching    Current Outpatient Medications  Medication Sig Dispense Refill  . acetaminophen (TYLENOL) 500 MG tablet Take 500 mg by mouth 3 (three) times daily as needed.    Marland Kitchen amLODipine (NORVASC) 5 MG tablet Take 5 mg by mouth daily.    . ASPIRIN 81 81 MG chewable tablet Chew 1 tablet by mouth daily. (Patient not taking: Reported on 03/26/2020)    . atorvastatin (LIPITOR) 40 MG tablet Take 40 mg by mouth daily.    . carvedilol (COREG) 6.25 MG tablet Take 6.25 mg by mouth 2 (two) times daily with a meal.    . celecoxib (CELEBREX) 200 MG capsule Take 200 mg by mouth daily.     Marland Kitchen dexamethasone (DECADRON) 4 MG tablet Take 2 tablets BID the day before chemotherapy, then 2 tablets BID for three days after chemotherapy.  Then take 1 tablet daily x 5 days, then stop. 30 tablet 0  . dexamethasone (DECADRON) 4 MG tablet Take 2 tablets (8 mg total) by mouth daily. Take daily for 3 days after chemo. Take with food. 30 tablet 1  . fluconazole (DIFLUCAN) 200 MG tablet Take 1 tablet (200 mg total) by mouth daily. (Patient not taking: Reported on 03/26/2020) 30 tablet  0  . lansoprazole (PREVACID) 30 MG capsule Take 30 mg by mouth every morning.    Marland Kitchen losartan (COZAAR) 100 MG tablet Take 100 mg by mouth daily.    . metFORMIN (GLUCOPHAGE) 1000 MG tablet Take 1,000 mg by mouth daily with breakfast.     . prochlorperazine (COMPAZINE) 10 MG tablet Take 1 tablet (10 mg total) by mouth every 6 (six) hours as needed (Nausea or vomiting). 30 tablet 1   No current facility-administered medications for this visit.    OBJECTIVE: African-American woman who appears older than stated age  32:   04/16/20 0822  BP: 128/68  Pulse: 100  Resp: 18  Temp: 97.8 F (36.6 C)  SpO2: 100%     Body mass index is 35.8 kg/m.   Wt Readings from Last 3 Encounters:  04/16/20 221 lb 12.8 oz (100.6 kg)  04/02/20 220 lb (99.8 kg)  03/26/20 223 lb 3.2 oz (101.2 kg)      ECOG FS:2 - Symptomatic, <50% confined  to bed  Sclerae unicteric, EOMs intact Wearing a mask No cervical or supraclavicular adenopathy Lungs no rales or rhonchi Heart regular rate and rhythm Abd soft, nontender, positive bowel sounds MSK no focal spinal tenderness, no upper extremity lymphedema Neuro: nonfocal, well oriented, appropriate affect Breasts: The right axillary mass is still palpable.  It is movable.  It is about 2-1/2 cm.  The right breast mass is also still palpable.  It appears slightly smaller and softer than prior.   LAB RESULTS:  CMP     Component Value Date/Time   NA 138 04/02/2020 0900   K 4.1 04/02/2020 0900   CL 105 04/02/2020 0900   CO2 26 04/02/2020 0900   GLUCOSE 158 (H) 04/02/2020 0900   BUN 11 04/02/2020 0900   CREATININE 0.76 04/02/2020 0900   CALCIUM 9.3 04/02/2020 0900   PROT 6.1 (L) 04/02/2020 0900   ALBUMIN 3.5 04/02/2020 0900   AST 10 (L) 04/02/2020 0900   ALT 14 04/02/2020 0900   ALKPHOS 73 04/02/2020 0900   BILITOT 0.6 04/02/2020 0900   GFRNONAA >60 04/02/2020 0900    No results found for: TOTALPROTELP, ALBUMINELP, A1GS, A2GS, BETS, BETA2SER, GAMS, MSPIKE, SPEI  Lab Results  Component Value Date   WBC 3.5 (L) 04/16/2020   NEUTROABS 2.8 04/16/2020   HGB 10.4 (L) 04/16/2020   HCT 31.7 (L) 04/16/2020   MCV 89.5 04/16/2020   PLT 327 04/16/2020    No results found for: LABCA2  No components found for: KYHCWC376  No results for input(s): INR in the last 168 hours.  No results found for: LABCA2  No results found for: EGB151  No results found for: VOH607  No results found for: PXT062  No results found for: CA2729  No components found for: HGQUANT  No results found for: CEA1 / No results found for: CEA1   No results found for: AFPTUMOR  No results found for: CHROMOGRNA  No results found for: KPAFRELGTCHN, LAMBDASER, KAPLAMBRATIO (kappa/lambda light chains)  No results found for: HGBA, HGBA2QUANT, HGBFQUANT, HGBSQUAN (Hemoglobinopathy evaluation)   No  results found for: LDH  No results found for: IRON, TIBC, IRONPCTSAT (Iron and TIBC)  No results found for: FERRITIN  Urinalysis No results found for: COLORURINE, APPEARANCEUR, LABSPEC, PHURINE, GLUCOSEU, HGBUR, BILIRUBINUR, KETONESUR, PROTEINUR, UROBILINOGEN, NITRITE, LEUKOCYTESUR   STUDIES: VAS [place] UPPER EXTREMITY VENOUS DUPLEX  Result Date: 04/02/2020 UPPER VENOUS STUDY  Indications: Swelling Comparison Study: No prior studies. Performing Technologist: Oliver Hum RVT  Examination  Guidelines: A complete evaluation includes B-mode imaging, spectral Doppler, color Doppler, and power Doppler as needed of all accessible portions of each vessel. Bilateral testing is considered an integral part of a complete examination. Limited examinations for reoccurring indications may be performed as noted.  Right Findings: +----------+------------+---------+-----------+----------+-------+ RIGHT     CompressiblePhasicitySpontaneousPropertiesSummary +----------+------------+---------+-----------+----------+-------+ Subclavian    Full       Yes       Yes                      +----------+------------+---------+-----------+----------+-------+  Left Findings: +----------+------------+---------+-----------+----------+-------+ LEFT      CompressiblePhasicitySpontaneousPropertiesSummary +----------+------------+---------+-----------+----------+-------+ IJV           Full       Yes       Yes                      +----------+------------+---------+-----------+----------+-------+ Subclavian    Full       Yes       Yes                      +----------+------------+---------+-----------+----------+-------+ Axillary      Full       Yes       Yes                      +----------+------------+---------+-----------+----------+-------+ Brachial      Full       Yes       Yes                      +----------+------------+---------+-----------+----------+-------+ Radial        Full                                           +----------+------------+---------+-----------+----------+-------+ Ulnar         Full                                          +----------+------------+---------+-----------+----------+-------+ Cephalic      Full                                          +----------+------------+---------+-----------+----------+-------+ Basilic       Full                                          +----------+------------+---------+-----------+----------+-------+  Summary:  Right: No evidence of thrombosis in the subclavian.  Left: No evidence of deep vein thrombosis in the upper extremity. No evidence of superficial vein thrombosis in the upper extremity.  *See table(s) above for measurements and observations.  Diagnosing physician: Monica Martinez MD Electronically signed by Monica Martinez MD on 04/02/2020 at 4:47:11 PM.    Final      ELIGIBLE FOR AVAILABLE RESEARCH PROTOCOL: AET  ASSESSMENT: 64 y.o.  woman status post right breast upper outer quadrant (12:00) and axillary lymph node biopsy 02/03/2020, both positive for a clinical T1c N1, stage IIB invasive ductal carcinoma, grade 3, estrogen receptor positive, HER-2 and progesterone receptor  negative, with an MIB-1 of 60%.  (a) biopsy of a second right upper outer quadrant T2 lesion (11 o'clock) 02/06/2020 also showed invasive ductal carcinoma, but grade 2, estrogen receptor positive, progesterone receptor and HER-2 negative, with an MIB-1 of 15%  (b) biopsy of a third right breast lesion (1:00) was benign   (1) neoadjuvant chemotherapy consisting of cyclophosphamide and docetaxel every 21 days x 4 starting 03/05/2020  (a) discontinued after 2 cycles because of peripheral neuropathy  (b) to start cyclophosphamide/doxorubicin 04/24/2020  (2) definitive surgery to follow  (3) adjuvant radiation  (4) antiestrogens   PLAN: [name] [name] is a little equivocal regarding her symptoms but to the  best of my ability to tell as she is developing some neuropathy.  This is grade 1 at present.  It is today not present in her hands but it is persisting in her feet.  I do not think we can continue the docetaxel or for that matter give her paclitaxel later on.  We are switching her chemotherapy to cyclophosphamide and doxorubicin and we will do 2-4 cycles of those agents depending on tolerance and evidence of response  I did discuss the possible toxicities side effects and complications with the patient.  I have set her up for an echocardiogram hopefully to be done next week.  I am not sure why she has some swelling in the left upper extremity.  I am going to obtain a CT of the chest just to make sure were not missing anything.Marland Kitchen  She will see me again 04/24/2020 hopefully to start her new cycle of chemotherapy at that time.  Total encounter time 30 minutes.Sarajane Jews C. Scarlett Portlock, MD 04/16/20 8:43 AM Medical Oncology and Hematology [facility] [place], [place] [address] Tel. [phone]    Fax. [phone]   I, Wilburn Mylar, am acting as scribe for Dr. Virgie Dad. Brittny Spangle.  I, Lurline Del MD, have reviewed the above documentation for accuracy and completeness, and I agree with the above.    *Total Encounter Time as defined by the Centers for Medicare and Medicaid Services includes, in addition to the face-to-face time of a patient visit (documented in the note above) non-face-to-face time: obtaining and reviewing outside history, ordering and reviewing medications, tests or procedures, care coordination (communications with other health care professionals or caregivers) and documentation in the medical record.

## 2020-04-16 ENCOUNTER — Inpatient Hospital Stay: Payer: Self-pay | Attending: Oncology | Admitting: Oncology

## 2020-04-16 ENCOUNTER — Inpatient Hospital Stay: Payer: Self-pay

## 2020-04-16 ENCOUNTER — Other Ambulatory Visit: Payer: Self-pay

## 2020-04-16 VITALS — BP 128/68 | HR 100 | Temp 97.8°F | Resp 18 | Ht 66.0 in | Wt 221.8 lb

## 2020-04-16 DIAGNOSIS — Z7901 Long term (current) use of anticoagulants: Secondary | ICD-10-CM | POA: Insufficient documentation

## 2020-04-16 DIAGNOSIS — Z86718 Personal history of other venous thrombosis and embolism: Secondary | ICD-10-CM | POA: Insufficient documentation

## 2020-04-16 DIAGNOSIS — E860 Dehydration: Secondary | ICD-10-CM | POA: Insufficient documentation

## 2020-04-16 DIAGNOSIS — E1142 Type 2 diabetes mellitus with diabetic polyneuropathy: Secondary | ICD-10-CM | POA: Insufficient documentation

## 2020-04-16 DIAGNOSIS — Z79899 Other long term (current) drug therapy: Secondary | ICD-10-CM | POA: Insufficient documentation

## 2020-04-16 DIAGNOSIS — Z5111 Encounter for antineoplastic chemotherapy: Secondary | ICD-10-CM | POA: Insufficient documentation

## 2020-04-16 DIAGNOSIS — C50411 Malignant neoplasm of upper-outer quadrant of right female breast: Secondary | ICD-10-CM

## 2020-04-16 DIAGNOSIS — Z170 Estrogen receptor positive status [ER+]: Secondary | ICD-10-CM

## 2020-04-16 LAB — COMPREHENSIVE METABOLIC PANEL
ALT: 34 U/L (ref 0–44)
AST: 19 U/L (ref 15–41)
Albumin: 3.3 g/dL — ABNORMAL LOW (ref 3.5–5.0)
Alkaline Phosphatase: 115 U/L (ref 38–126)
Anion gap: 9 (ref 5–15)
BUN: 9 mg/dL (ref 8–23)
CO2: 23 mmol/L (ref 22–32)
Calcium: 9.5 mg/dL (ref 8.9–10.3)
Chloride: 109 mmol/L (ref 98–111)
Creatinine, Ser: 0.67 mg/dL (ref 0.44–1.00)
GFR, Estimated: >60 mL/min (ref >60)
Glucose, Bld: 204 mg/dL — ABNORMAL HIGH (ref 70–99)
Potassium: 3.6 mmol/L (ref 3.5–5.1)
Sodium: 141 mmol/L (ref 135–145)
Total Bilirubin: 0.4 mg/dL (ref 0.3–1.2)
Total Protein: 6.4 g/dL — ABNORMAL LOW (ref 6.5–8.1)

## 2020-04-16 LAB — CBC WITH DIFFERENTIAL/PLATELET
Abs Immature Granulocytes: 0.03 10*3/uL (ref 0.00–0.07)
Basophils Absolute: 0 10*3/uL (ref 0.0–0.1)
Basophils Relative: 0 %
Eosinophils Absolute: 0 10*3/uL (ref 0.0–0.5)
Eosinophils Relative: 0 %
HCT: 31.7 % — ABNORMAL LOW (ref 36.0–46.0)
Hemoglobin: 10.4 g/dL — ABNORMAL LOW (ref 12.0–15.0)
Immature Granulocytes: 1 %
Lymphocytes Relative: 17 %
Lymphs Abs: 0.6 10*3/uL — ABNORMAL LOW (ref 0.7–4.0)
MCH: 29.4 pg (ref 26.0–34.0)
MCHC: 32.8 g/dL (ref 30.0–36.0)
MCV: 89.5 fL (ref 80.0–100.0)
Monocytes Absolute: 0.1 10*3/uL (ref 0.1–1.0)
Monocytes Relative: 3 %
Neutro Abs: 2.8 10*3/uL (ref 1.7–7.7)
Neutrophils Relative %: 79 %
Platelets: 327 10*3/uL (ref 150–400)
RBC: 3.54 MIL/uL — ABNORMAL LOW (ref 3.87–5.11)
RDW: 15.6 % — ABNORMAL HIGH (ref 11.5–15.5)
WBC: 3.5 10*3/uL — ABNORMAL LOW (ref 4.0–10.5)
nRBC: 0 % (ref 0.0–0.2)

## 2020-04-16 MED ORDER — DEXAMETHASONE 4 MG PO TABS: 8.0000 mg | ORAL_TABLET | Freq: Every day | ORAL | 1 refills | Status: DC

## 2020-04-16 MED ORDER — PROCHLORPERAZINE MALEATE 10 MG PO TABS: 10.0000 mg | ORAL_TABLET | Freq: Four times a day (QID) | ORAL | 1 refills | Status: DC | PRN

## 2020-04-16 NOTE — Progress Notes (Signed)
DISCONTINUE OFF PATHWAY REGIMEN - Breast   OFF00004:Docetaxel + Cyclophosphamide (TC):   A cycle is every 21 days:     Docetaxel      Cyclophosphamide   **Always confirm dose/schedule in your pharmacy ordering system**  REASON: Toxicities / Adverse Event PRIOR TREATMENT: Off Pathway: Docetaxel + Cyclophosphamide (TC) TREATMENT RESPONSE: Partial Response (PR)  START ON PATHWAY REGIMEN - Breast     Cycles 1 through 4: A cycle is every 14 days:     Doxorubicin      Cyclophosphamide      Pegfilgrastim-xxxx    Cycles 5 through 16: A cycle is every 7 days:     Paclitaxel   **Always confirm dose/schedule in your pharmacy ordering system**  Patient Characteristics: Preoperative or Nonsurgical Candidate (Clinical Staging), Neoadjuvant Therapy followed by Surgery, Invasive Disease, Chemotherapy, HER2 Negative/Unknown/Equivocal, ER Positive Therapeutic Status: Preoperative or Nonsurgical Candidate (Clinical Staging) AJCC M Category: cM0 AJCC Grade: G3 Breast Surgical Plan: Neoadjuvant Therapy followed by Surgery ER Status: Positive (+) AJCC 8 Stage Grouping: IIIA HER2 Status: Negative (-) AJCC T Category: cT2 AJCC N Category: cN1 PR Status: Negative (-) Intent of Therapy: Curative Intent, Discussed with Patient

## 2020-04-17 ENCOUNTER — Telehealth: Payer: Self-pay | Admitting: Oncology

## 2020-04-17 ENCOUNTER — Ambulatory Visit (HOSPITAL_COMMUNITY)
Admission: RE | Admit: 2020-04-17 | Discharge: 2020-04-17 | Disposition: A | Discharge status: Home / Self Care | Payer: Self-pay | Source: Ambulatory Visit | Attending: Oncology | Admitting: Oncology

## 2020-04-17 DIAGNOSIS — Z170 Estrogen receptor positive status [ER+]: Secondary | ICD-10-CM | POA: Insufficient documentation

## 2020-04-17 DIAGNOSIS — I10 Essential (primary) hypertension: Secondary | ICD-10-CM | POA: Insufficient documentation

## 2020-04-17 DIAGNOSIS — E119 Type 2 diabetes mellitus without complications: Secondary | ICD-10-CM | POA: Insufficient documentation

## 2020-04-17 DIAGNOSIS — Z0189 Encounter for other specified special examinations: Secondary | ICD-10-CM

## 2020-04-17 DIAGNOSIS — C50411 Malignant neoplasm of upper-outer quadrant of right female breast: Secondary | ICD-10-CM | POA: Insufficient documentation

## 2020-04-17 LAB — ECHOCARDIOGRAM COMPLETE
Area-P 1/2: 2.62 cm2
S' Lateral: 2.6 cm

## 2020-04-17 NOTE — Progress Notes (Signed)
Pharmacist Chemotherapy Monitoring - Initial Assessment    Anticipated start date: 04/24/20   Regimen:  . Are orders appropriate based on the patient's diagnosis, regimen, and cycle? Yes . Does the plan date match the patient's scheduled date? Yes . Is the sequencing of drugs appropriate? Yes . Are the premedications appropriate for the patient's regimen? Yes . Prior Authorization for treatment is: Uninsured o If applicable, is the correct biosimilar selected based on the patient's insurance? yes  Organ Function and Labs: Marland Kitchen Are dose adjustments needed based on the patient's renal function, hepatic function, or hematologic function? No . Are appropriate labs ordered prior to the start of patient's treatment? Yes . Other organ system assessment, if indicated: anthracyclines: Echo/ MUGA . The following baseline labs, if indicated, have been ordered: N/A  Dose Assessment: . Are the drug doses appropriate? Yes . Are the following correct: o Drug concentrations Yes o IV fluid compatible with drug Yes o Administration routes Yes o Timing of therapy Yes . If applicable, does the patient have documented access for treatment and/or plans for port-a-cath placement? yes . If applicable, have lifetime cumulative doses been properly documented and assessed? yes Lifetime Dose Tracking  No doses have been documented on this patient for the following tracked chemicals: Doxorubicin, Epirubicin, Idarubicin, Daunorubicin, Mitoxantrone, Bleomycin, Oxaliplatin, Carboplatin, Liposomal Doxorubicin  o   Toxicity Monitoring/Prevention: . The patient has the following take home antiemetics prescribed: Prochlorperazine and Dexamethasone . The patient has the following take home medications prescribed: N/A . Medication allergies and previous infusion related reactions, if applicable, have been reviewed and addressed. Yes . The patient's current medication list has been assessed for drug-drug interactions with  their chemotherapy regimen. no significant drug-drug interactions were identified on review.  Order Review: . Are the treatment plan orders signed? Yes . Is the patient scheduled to see a provider prior to their treatment? Yes  I verify that I have reviewed each item in the above checklist and answered each question accordingly.  Kennith Center, Pharm.D., CPP 04/17/2020@4 :44 PM

## 2020-04-17 NOTE — Telephone Encounter (Signed)
Scheduled appts per 12/6 los. Pt to get updated appt calendar at next visit per appt notes.

## 2020-04-17 NOTE — Progress Notes (Signed)
  Echocardiogram 2D Echocardiogram has been performed.  Charliegh Vasudevan G Rosmery Duggin 04/17/2020, 11:46 AM

## 2020-04-18 ENCOUNTER — Inpatient Hospital Stay: Payer: Self-pay

## 2020-04-20 ENCOUNTER — Telehealth: Payer: Self-pay | Admitting: Oncology

## 2020-04-20 NOTE — Telephone Encounter (Signed)
Rescheduled appt 1/3 appt per 12/3 sch msg. Pt to get updated appt calendar per appt notes.

## 2020-04-23 ENCOUNTER — Other Ambulatory Visit: Payer: Self-pay

## 2020-04-23 ENCOUNTER — Encounter: Payer: Self-pay | Admitting: Oncology

## 2020-04-23 ENCOUNTER — Ambulatory Visit (HOSPITAL_COMMUNITY)
Admission: RE | Admit: 2020-04-23 | Discharge: 2020-04-23 | Disposition: A | Discharge status: Home / Self Care | Payer: Self-pay | Source: Ambulatory Visit | Attending: Oncology | Admitting: Oncology

## 2020-04-23 DIAGNOSIS — Z170 Estrogen receptor positive status [ER+]: Secondary | ICD-10-CM | POA: Insufficient documentation

## 2020-04-23 DIAGNOSIS — C50411 Malignant neoplasm of upper-outer quadrant of right female breast: Secondary | ICD-10-CM | POA: Insufficient documentation

## 2020-04-23 IMAGING — CT CT CHEST W/ CM
2 of 4 series · 15 of 36 positions shown, 18 images · IV contrast (omnipaque)
Comparison: None.

CLINICAL DATA: Left upper extremity swelling. Estrogen receptor
positive breast cancer on neoadjuvant chemotherapy.

EXAM:
CT CHEST WITH CONTRAST
TECHNIQUE: Multidetector CT imaging of the chest was performed during
intravenous contrast administration.
CONTRAST:  75mL OMNIPAQUE IOHEXOL 300 MG/ML  SOLN

[Series 2: axial st · axial · 0.71mm/px · z∈[-460,-192]mm · 12 of 160 slices shown, 15 images]
[im 13/160  mediastinal]
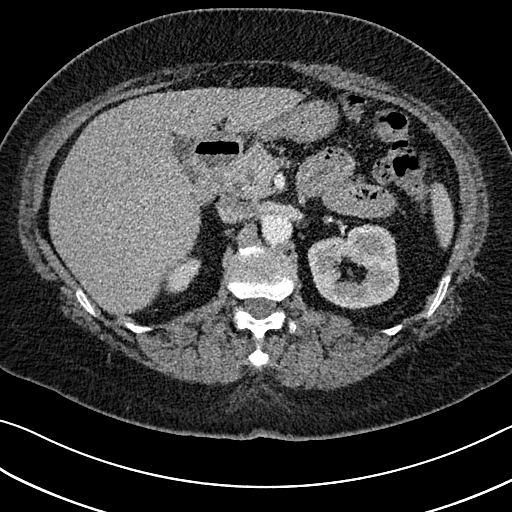
[im 13/160  lung]
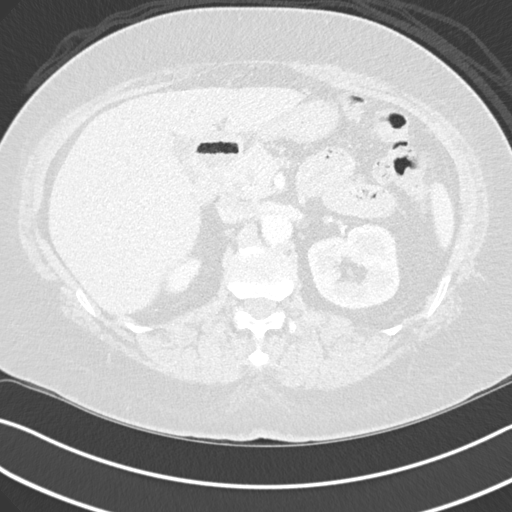
[im 25/160  lung]
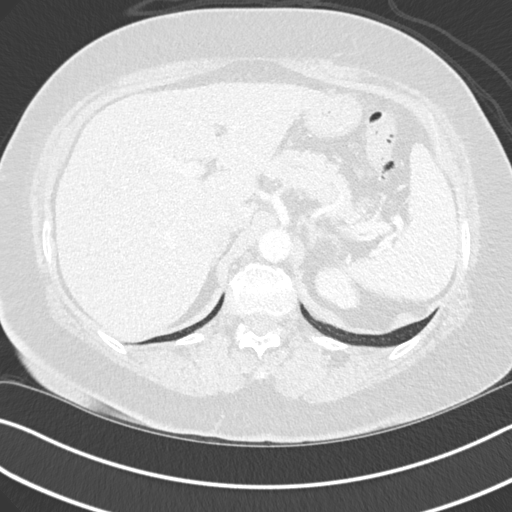
[im 37/160  lung]
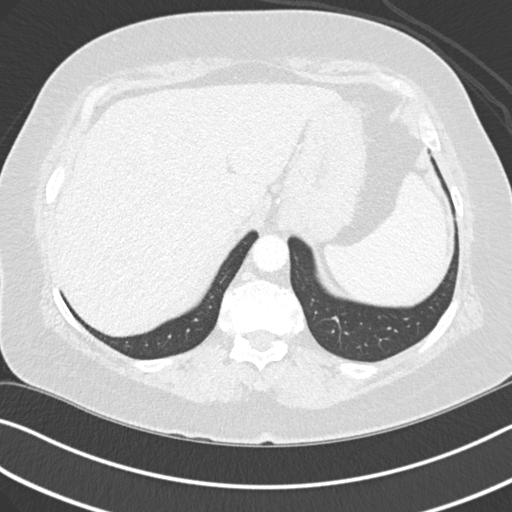
[im 49/160  lung]
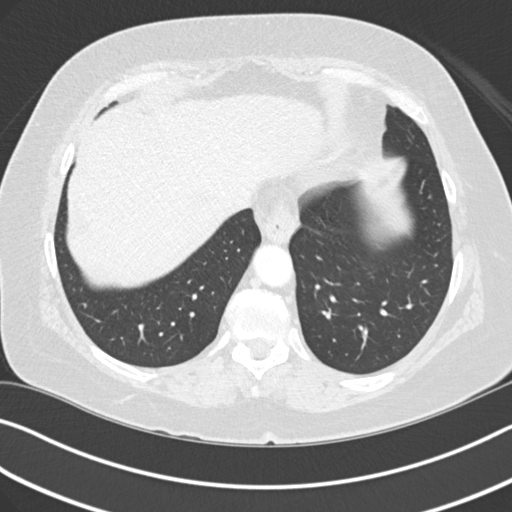
[im 62/160  mediastinal]
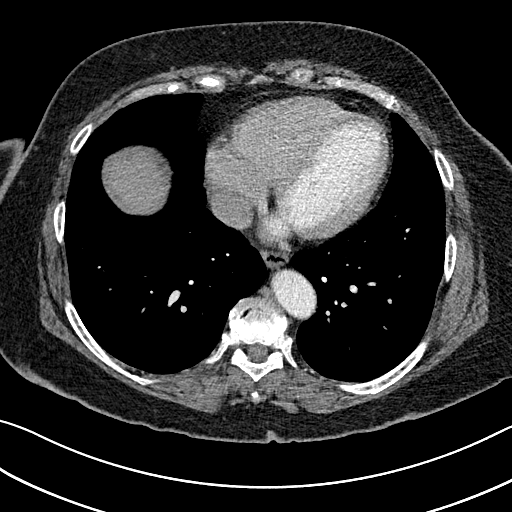
[im 62/160  lung]
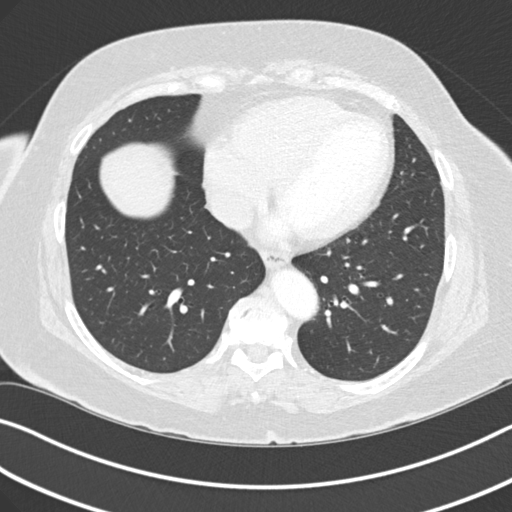
[im 74/160  lung]
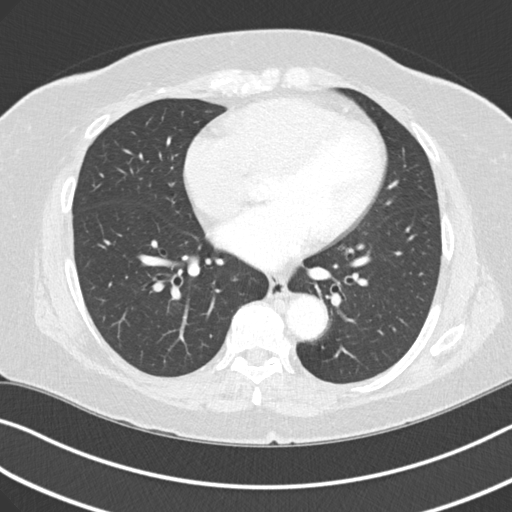
[im 86/160  lung]
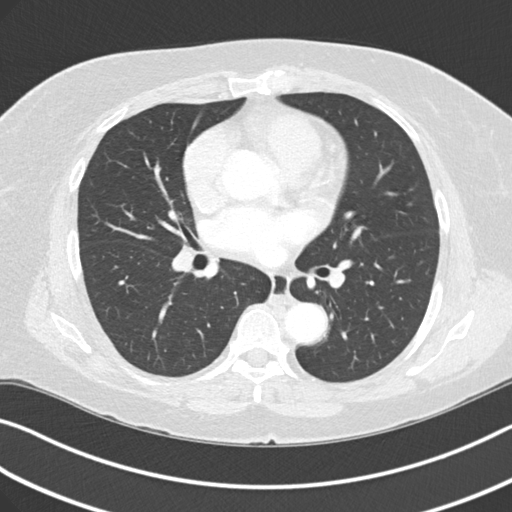
[im 98/160  lung]
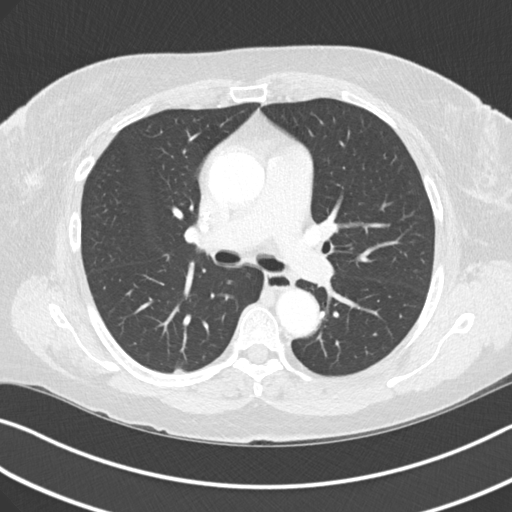
[im 111/160  mediastinal]
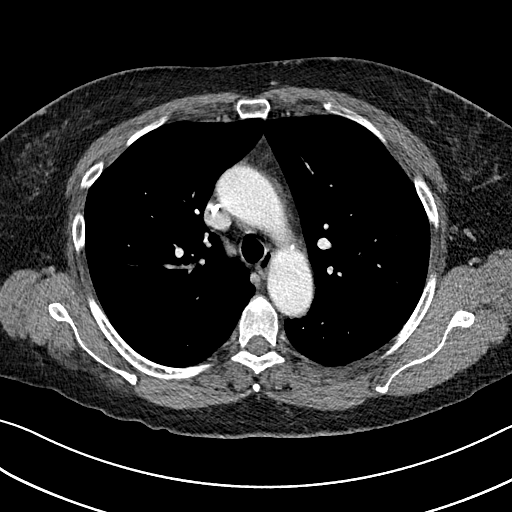
[im 111/160  lung]
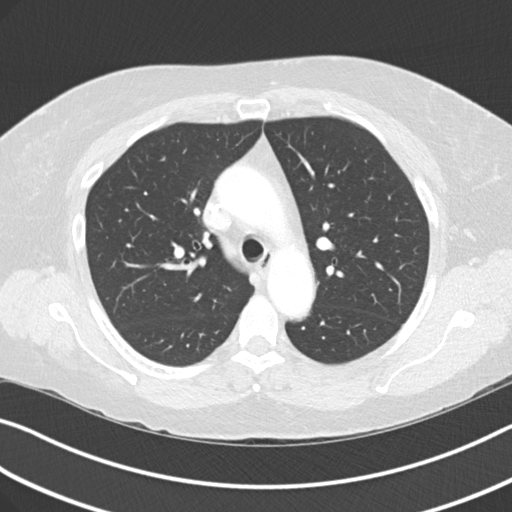
[im 123/160  lung]
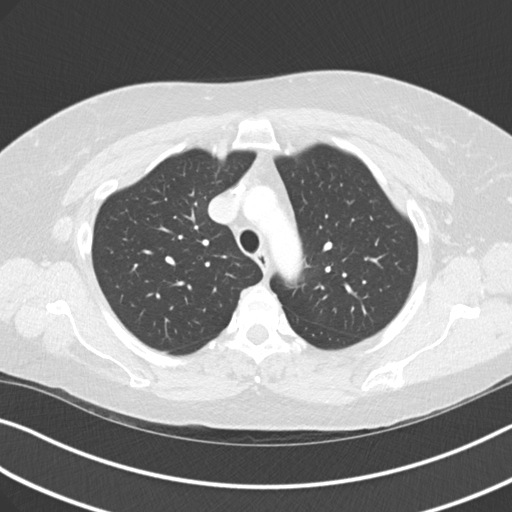
[im 135/160  lung]
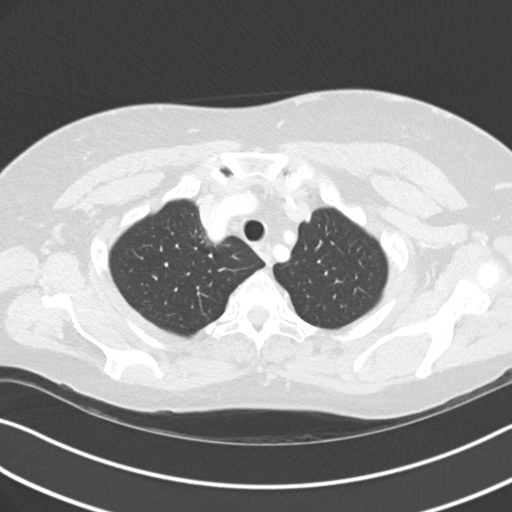
[im 147/160  lung]
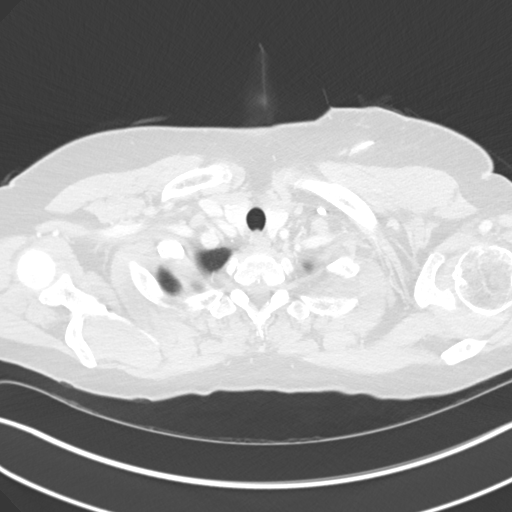

[Series 6: coronal · coronal · 0.64mm/px · 3 of 146 slices shown]
[im 30/146  lung]
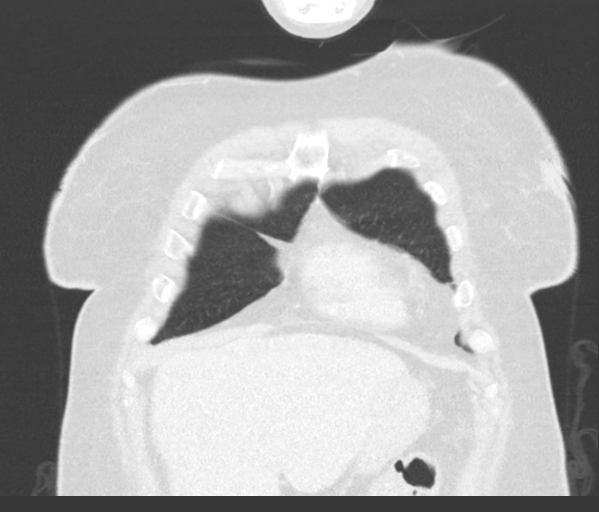
[im 59/146  lung]
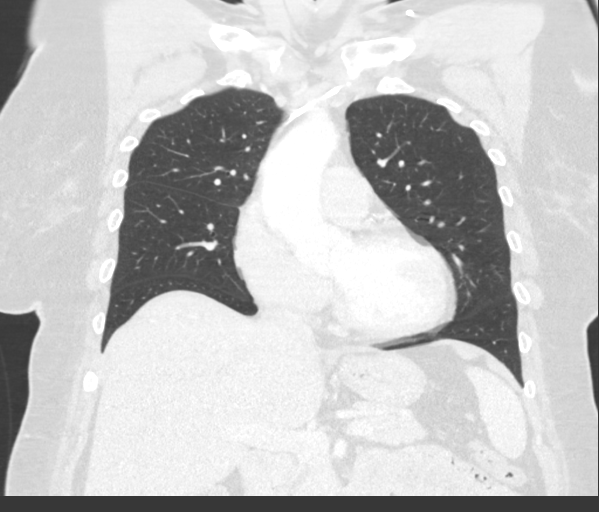
[im 88/146  lung]
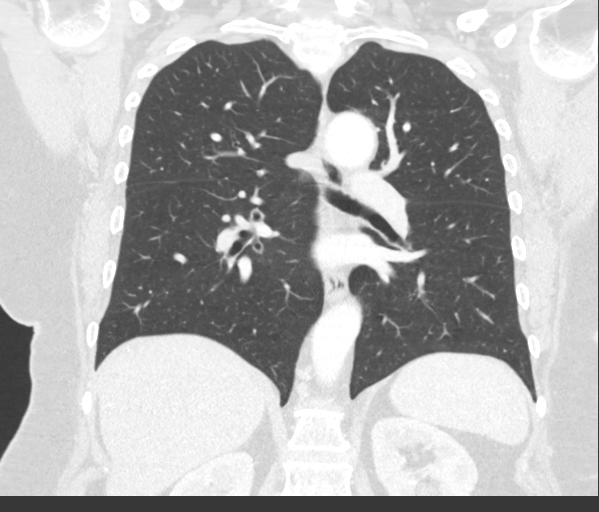

[15 of 36 positions shown; findings below may reference images not displayed]

FINDINGS: Cardiovascular: Normal heart size. No significant pericardial
effusion/thickening. Left internal jugular Port-A-Cath terminates in
the lower third of the SVC. Suggestion of a small nonocclusive
catheter associated thrombus in the left brachiocephalic vein
(series 2/image 31). Atherosclerotic nonaneurysmal thoracic aorta.
Top-normal caliber main pulmonary artery (3.3 cm diameter). No
central pulmonary emboli.

Mediastinum/Nodes: No discrete thyroid nodules. Unremarkable
esophagus. Multiple mildly enlarged right axillary lymph nodes,
largest with short axis diameter 1.4 cm (series 2/image 36). No left
axillary adenopathy.

Lungs/Pleura: No pneumothorax. No pleural effusion. No acute
consolidative airspace disease or lung masses. Tiny 2 mm posterior
right upper lobe pulmonary nodule (series 5/image 27). No additional
significant pulmonary nodules.

Upper abdomen: Small hiatal hernia.

Musculoskeletal: No aggressive appearing focal osseous lesions.
Clustered right breast 1.5 cm and 1.4 cm soft tissue masses (series
2/images 60 and 58, respectively). Moderate lower thoracic
spondylosis.
IMPRESSION: 1. Suggestion of a small nonocclusive catheter associated thrombus
in the left brachiocephalic vein. Left internal jugular Port-A-Cath
is well positioned with tip in the lower third of the SVC.
2. Clustered right breast soft tissue masses, presumably
representing known primary breast malignancy.
3. Multiple mildly enlarged right axillary lymph nodes, compatible
with metastatic disease.
4. Tiny 2 mm posterior right upper lobe pulmonary nodule, for which
3 month chest CT follow-up is recommended.
5. Small hiatal hernia.
6. Aortic Atherosclerosis ([0F]-[0F]).

These results will be called to the ordering clinician or
representative by the Radiologist Assistant, and communication
documented in the PACS or [REDACTED].

## 2020-04-23 MED ORDER — HEPARIN SOD (PORK) LOCK FLUSH 100 UNIT/ML IV SOLN
INTRAVENOUS | Status: AC
  Filled 2020-04-23: qty 5

## 2020-04-23 MED ORDER — SODIUM CHLORIDE (PF) 0.9 % IJ SOLN
INTRAMUSCULAR | Status: AC
  Filled 2020-04-23: qty 50

## 2020-04-23 MED ORDER — IOHEXOL 300 MG/ML  SOLN
75.0000 mL | Freq: Once | INTRAMUSCULAR | Status: AC | PRN
  Administered 2020-04-23: 16:00:00 75 mL via INTRAVENOUS

## 2020-04-23 MED ORDER — HEPARIN SOD (PORK) LOCK FLUSH 100 UNIT/ML IV SOLN
500.0000 [IU] | Freq: Once | INTRAVENOUS | Status: AC
  Administered 2020-04-23: 500 [IU] via INTRAVENOUS

## 2020-04-23 NOTE — Progress Notes (Signed)
Patient's daughter called to discuss grant details. Advised what is needed to apply.  Will meet with patient 04/24/20 to obtain letter of support and grant signature.  They have my card for any additional financial questions or concerns.

## 2020-04-23 NOTE — Progress Notes (Signed)
[place]  [phone] Fax:(336) [phone]     ID: [name] [name] DOB: 1956-03-02  MR#: [id number]  VOH#:[id number]  Patient Care Team: Kristie Cowman, MD as PCP - General (Family Medicine) Mauro Kaufmann, RN as Oncology Nurse Navigator Rockwell Germany, RN as Oncology Nurse Navigator Magrinat, Virgie Dad, MD as Consulting Physician (Oncology) Erroll Luna, MD as Consulting Physician (General Surgery) Eppie Gibson, MD as Attending Physician (Radiation Oncology) Chauncey Cruel, MD OTHER MD:  CHIEF COMPLAINT: Estrogen receptor positive breast cancer  CURRENT TREATMENT: Neoadjuvant chemotherapy   INTERVAL HISTORY: [name] [name] returns today for follow up and treatment of her estrogen receptor positive breast cancer.   We are switching her chemotherapy to cyclophosphamide and doxorubicin because of her problems with neuropathy, and we will do 2-4 cycles of those agents, every 2 or 3 weeks depending on tolerance and evidence of response.  She underwent echocardiogram on 04/17/2020 showing an ejection fraction of 55-60%.  She also underwent chest CT yesterday 04/23/2020 to evaluate for possible causes of her left upper extremity swelling. Results suggest a small nonocclusive thrombus associated with the catheter in the left brachiocephalic; then you see the masses in the right breast and multiple mildly enlarged right axillary lymph nodes.  She has a small hiatal hernia, aortic atherosclerosis, and a 0.2 cm right upper lobe nodule which is of undetermined importance.  There is no clear evidence of metastatic disease   REVIEW OF SYSTEMS: Alaiya is generally doing well.  She is at her baseline.  She has occasional headaches on the back of her head she says.  She has pain in her joints.  Left arm continues swollen but not erythematous or tender.   COVID 19 VACCINATION STATUS: s/p    X2, most recently June 2021   HISTORY OF CURRENT ILLNESS: From the original intake  note:  [name] [name] herself palpated a mass in the right axilla "a long time ago", more recently with complaints of associated pain. She brought it to medical attention and underwent bilateral diagnostic mammography with tomography and bilateral breast ultrasonography at [facility] on 01/31/2020 showing: breast density category B; three right breast masses-- 1.9 cm at 12 o'clock, 2.3 cm at 11 o'clock, 0.4 cm at 1 o'clock; two adjacent enlarged thickened lymph nodes measuring up to 2.1 cm; no evidence of left breast malignancy.  Accordingly on 02/03/2020 she proceeded to biopsy of the right breast area at 12 o'clock and right axilla. The pathology from this procedure ([id number]) showed: invasive ductal carcinoma, grade 3. Prognostic indicators significant for: estrogen receptor, 100% positive with strong staining intensity and progesterone receptor, 0% negative. Proliferation marker Ki67 at 60%. HER2 equivocal by immunohistochemistry (2+), but negative by fluorescent in situ hybridization with a signals ratio 0.81 and number per cell 2.7.  The biopsied lymph node was found to show metastatic carcinoma. The morphology was considered different from the biopsied mass, and a prognostic panel was performed. Estrogen receptor, 100% positive with strong staining intensity and progesterone receptor, 0% negative. Proliferation marker Ki67 at 70%. HER2 equivcoal by immunohistochemistry (2+), but negative) by fluorescent in situ hybridization with a signals ratio 1.16 and number per cell 3.9.  She underwent additional right breast biopsies on 02/06/2020. Pathology [id number]) from the mass at 11 o'clock showed: invasive ductal carcinoma, grade 2. Prognostic indicators significant for: estrogen receptor, 100% positive with strong staining intensity and progesterone receptor, 0% negative. Proliferation marker Ki67 at 15%. HER2 equivocal by immunohistochemistry (2+), but negative by fluorescent in  situ hybridization  with a signals ratio 1.29 and number per cell 2.7.  The biopsied mass at 1 o'clock showed only fibrocystic change.  The patient's subsequent history is as detailed below.   PAST MEDICAL HISTORY: Past Medical History:  Diagnosis Date  . Arthritis   . Diabetes mellitus without complication ([place])   . Hypertension     PAST SURGICAL HISTORY: Past Surgical History:  Procedure Laterality Date  . IR IMAGING GUIDED PORT INSERTION  02/23/2020    FAMILY HISTORY: Family History  Problem Relation Age of Onset  . Asthma Mother   . Arthritis Sister   . Asthma Brother   . Arthritis Brother   [name] died in his 79s from causes unknown to the patient.  [name] died from heart disease at age 38.  The patient had 4 brothers, 1 sister, with no history of cancer in the family to her knowledge   GYNECOLOGIC HISTORY:  No LMP recorded. Patient is postmenopausal. Menarche: 64 years old Age at first live birth: 64 years old [place] P 1 LMP 18s Contraceptive HRT no  Hysterectomy? no BSO? no   SOCIAL HISTORY: (updated 02/2020)  Ariza is originally from [place].  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  She describes herself as single.  At home she lives with her daughter [name] who works for [employer], her daughters husband [name] who is a English as a second language teacher, and their children who are 75 months old and 71 years old.  The patient is in [place]: Not in place.  The patient tells me she would intend to name her daughter [name] as her healthcare power of attorney   HEALTH MAINTENANCE: Social History   Tobacco Use  . Smoking status: Never Smoker  . Smokeless tobacco: Never Used  Vaping Use  . Vaping Use: Never used  Substance Use Topics  . Alcohol use: Yes    Alcohol/week: 1.0 standard drink    Types: 1 Glasses of wine per week    Comment: occasionally  . Drug use: Never     Colonoscopy: never done  PAP: 2017 (performed  in [place])  Bone density:    Allergies  Allergen Reactions  . Sulfa Antibiotics Itching    Current Outpatient Medications  Medication Sig Dispense Refill  . acetaminophen (TYLENOL) 500 MG tablet Take 500 mg by mouth 3 (three) times daily as needed.    Marland Kitchen amLODipine (NORVASC) 5 MG tablet Take 5 mg by mouth daily.    . ASPIRIN 81 81 MG chewable tablet Chew 1 tablet by mouth daily. (Patient not taking: Reported on 03/26/2020)    . atorvastatin (LIPITOR) 40 MG tablet Take 40 mg by mouth daily.    . carvedilol (COREG) 6.25 MG tablet Take 6.25 mg by mouth 2 (two) times daily with a meal.    . celecoxib (CELEBREX) 200 MG capsule Take 200 mg by mouth daily.     Marland Kitchen dexamethasone (DECADRON) 4 MG tablet Take 2 tablets BID the day before chemotherapy, then 2 tablets BID for three days after chemotherapy.  Then take 1 tablet daily x 5 days, then stop. 30 tablet 0  . dexamethasone (DECADRON) 4 MG tablet Take 2 tablets (8 mg total) by mouth daily. Take daily for 3 days after chemo. Take with food. 30 tablet 1  . fluconazole (DIFLUCAN) 200 MG tablet Take 1 tablet (200 mg total) by mouth daily. (Patient not taking: Reported on 03/26/2020)  30 tablet 0  . lansoprazole (PREVACID) 30 MG capsule Take 30 mg by mouth every morning.    Marland Kitchen losartan (COZAAR) 100 MG tablet Take 100 mg by mouth daily.    . metFORMIN (GLUCOPHAGE) 1000 MG tablet Take 1,000 mg by mouth daily with breakfast.     . prochlorperazine (COMPAZINE) 10 MG tablet Take 1 tablet (10 mg total) by mouth every 6 (six) hours as needed (Nausea or vomiting). 30 tablet 1   No current facility-administered medications for this visit.    OBJECTIVE: African-American woman who appears older than stated age  12:   04/24/20 0921  BP: 128/71  Pulse: 99  Resp: 18  Temp: 97.6 F (36.4 C)  SpO2: 100%     Body mass index is 35.51 kg/m.   Wt Readings from Last 3 Encounters:  04/24/20 220 lb (99.8 kg)  04/16/20 221 lb 12.8 oz (100.6 kg)  04/02/20  220 lb (99.8 kg)      ECOG FS:2 - Symptomatic, <50% confined to bed  Sclerae unicteric, EOMs intact Wearing a mask No cervical or supraclavicular adenopathy Lungs no rales or rhonchi Heart regular rate and rhythm Abd soft, nontender, positive bowel sounds MSK no focal spinal tenderness, left upper extremity lymphedema as previously noted, no erythema Neuro: nonfocal, well oriented, appropriate affect Breasts: I do not palpate a well-defined mass in the right breast or right axilla.   LAB RESULTS:  CMP     Component Value Date/Time   NA 141 04/24/2020 0852   K 4.5 04/24/2020 0852   CL 108 04/24/2020 0852   CO2 22 04/24/2020 0852   GLUCOSE 168 (H) 04/24/2020 0852   BUN 10 04/24/2020 0852   CREATININE 0.79 04/24/2020 0852   CREATININE 0.76 04/02/2020 0900   CALCIUM 10.0 04/24/2020 0852   PROT 6.7 04/24/2020 0852   ALBUMIN 3.7 04/24/2020 0852   AST 13 (L) 04/24/2020 0852   AST 10 (L) 04/02/2020 0900   ALT 18 04/24/2020 0852   ALT 14 04/02/2020 0900   ALKPHOS 89 04/24/2020 0852   BILITOT 0.7 04/24/2020 0852   BILITOT 0.6 04/02/2020 0900   GFRNONAA >60 04/24/2020 0852   GFRNONAA >60 04/02/2020 0900    No results found for: TOTALPROTELP, ALBUMINELP, A1GS, A2GS, BETS, BETA2SER, GAMS, MSPIKE, SPEI  Lab Results  Component Value Date   WBC 3.6 (L) 04/24/2020   NEUTROABS 2.8 04/24/2020   HGB 11.2 (L) 04/24/2020   HCT 35.2 (L) 04/24/2020   MCV 92.1 04/24/2020   PLT 367 04/24/2020    No results found for: LABCA2  No components found for: MGNOIB704  No results for input(s): INR in the last 168 hours.  No results found for: LABCA2  No results found for: UGQ916  No results found for: XIH038  No results found for: UEK800  No results found for: CA2729  No components found for: HGQUANT  No results found for: CEA1 / No results found for: CEA1   No results found for: AFPTUMOR  No results found for: CHROMOGRNA  No results found for: KPAFRELGTCHN, LAMBDASER,  KAPLAMBRATIO (kappa/lambda light chains)  No results found for: HGBA, HGBA2QUANT, HGBFQUANT, HGBSQUAN (Hemoglobinopathy evaluation)   No results found for: LDH  No results found for: IRON, TIBC, IRONPCTSAT (Iron and TIBC)  No results found for: FERRITIN  Urinalysis No results found for: COLORURINE, APPEARANCEUR, LABSPEC, PHURINE, GLUCOSEU, HGBUR, BILIRUBINUR, KETONESUR, PROTEINUR, UROBILINOGEN, NITRITE, LEUKOCYTESUR   STUDIES: CT Chest W Contrast  Result Date: 04/24/2020 CLINICAL DATA:  Left upper extremity  swelling. Estrogen receptor positive breast cancer on neoadjuvant chemotherapy. EXAM: CT CHEST WITH CONTRAST TECHNIQUE: Multidetector CT imaging of the chest was performed during intravenous contrast administration. CONTRAST:  12m OMNIPAQUE IOHEXOL 300 MG/ML  SOLN COMPARISON:  None. FINDINGS: Cardiovascular: Normal heart size. No significant pericardial effusion/thickening. Left internal jugular Port-A-Cath terminates in the lower third of the SVC. Suggestion of a small nonocclusive catheter associated thrombus in the left brachiocephalic vein (series 2/image 31). Atherosclerotic nonaneurysmal thoracic aorta. Top-normal caliber main pulmonary artery (3.3 cm diameter). No central pulmonary emboli. Mediastinum/Nodes: No discrete thyroid nodules. Unremarkable esophagus. Multiple mildly enlarged right axillary lymph nodes, largest with short axis diameter 1.4 cm (series 2/image 36). No left axillary adenopathy. Lungs/Pleura: No pneumothorax. No pleural effusion. No acute consolidative airspace disease or lung masses. Tiny 2 mm posterior right upper lobe pulmonary nodule (series 5/image 27). No additional significant pulmonary nodules. Upper abdomen: Small hiatal hernia. Musculoskeletal: No aggressive appearing focal osseous lesions. Clustered right breast 1.5 cm and 1.4 cm soft tissue masses (series 2/images 60 and 58, respectively). Moderate lower thoracic spondylosis. IMPRESSION: 1. Suggestion  of a small nonocclusive catheter associated thrombus in the left brachiocephalic vein. Left internal jugular Port-A-Cath is well positioned with tip in the lower third of the SVC. 2. Clustered right breast soft tissue masses, presumably representing known primary breast malignancy. 3. Multiple mildly enlarged right axillary lymph nodes, compatible with metastatic disease. 4. Tiny 2 mm posterior right upper lobe pulmonary nodule, for which 3 month chest CT follow-up is recommended. 5. Small hiatal hernia. 6. Aortic Atherosclerosis (ICD10-I70.0). These results will be called to the ordering clinician or representative by the Radiologist Assistant, and communication documented in the PACS or [employer] Electronically Signed   By: JIlona SorrelM.D.   On: 04/24/2020 09:10   ECHOCARDIOGRAM COMPLETE  Result Date: 04/17/2020    ECHOCARDIOGRAM REPORT   [facility] Date of Exam: 04/17/2020 Medical Rec #:  [id number]  Height:       66.0 in Accession #:    [id number] Weight:       221.8 lb Date of Birth:  [birth year] BSA:          2.090 m Patient Age:    89years    BP:           97/59 mmHg Patient Gender: F           HR:           69 bpm. Exam Location:  Outpatient Procedure: 2D Echo, 3D Echo, Cardiac Doppler, Color Doppler and Strain Analysis Indications:    Z51.11 Encounter for antineoplastic chemotheraphy  History:        Patient has no prior history of Echocardiogram examinations.                 Risk Factors:Hypertension and Diabetes.  Sonographer:    TJonelle SidleDance Referring Phys: 8[place] 1. Left ventricular ejection fraction, by estimation, is 55 to 60%. The left ventricle has normal function. The left ventricle has no regional wall motion abnormalities. There is mild concentric left ventricular hypertrophy. Left ventricular diastolic parameters are consistent with Grade I diastolic dysfunction (impaired relaxation). The average left ventricular global longitudinal  strain is -16.5 %. The global longitudinal strain is abnormal.  2. Right ventricular systolic function is normal. The right ventricular size is normal. There is normal pulmonary artery systolic pressure.  3. Left atrial size was mildly dilated.  4. The mitral valve is normal in structure. Trivial mitral valve regurgitation. No evidence of mitral stenosis.  5. The aortic valve is normal in structure. Aortic valve regurgitation is not visualized. No aortic stenosis is present.  6. There is borderline dilatation of the ascending aorta, measuring 40 mm.  7. The inferior vena cava is normal in size with greater than 50% respiratory variability, suggesting right atrial pressure of 3 mmHg. Comparison(s): No prior Echocardiogram. FINDINGS  Left Ventricle: Left ventricular ejection fraction, by estimation, is 55 to 60%. The left ventricle has normal function. The left ventricle has no regional wall motion abnormalities. The average left ventricular global longitudinal strain is -16.5 %. The global longitudinal strain is abnormal. The left ventricular internal cavity size was normal in size. There is mild concentric left ventricular hypertrophy. Left ventricular diastolic parameters are consistent with Grade I diastolic dysfunction (impaired relaxation). Normal left ventricular filling pressure. Right Ventricle: The right ventricular size is normal. No increase in right ventricular wall thickness. Right ventricular systolic function is normal. There is normal pulmonary artery systolic pressure. The tricuspid regurgitant velocity is 2.17 m/s, and  with an assumed right atrial pressure of 3 mmHg, the estimated right ventricular systolic pressure is 46.2 mmHg. Left Atrium: Left atrial size was mildly dilated. Right Atrium: Right atrial size was normal in size. Pericardium: There is no evidence of pericardial effusion. Mitral Valve: The mitral valve is normal in structure. Trivial mitral valve regurgitation. No evidence of mitral  valve stenosis. Tricuspid Valve: The tricuspid valve is normal in structure. Tricuspid valve regurgitation is mild . No evidence of tricuspid stenosis. Aortic Valve: The aortic valve is normal in structure. Aortic valve regurgitation is not visualized. No aortic stenosis is present. Pulmonic Valve: The pulmonic valve was normal in structure. Pulmonic valve regurgitation is not visualized. No evidence of pulmonic stenosis. Aorta: The aortic root is normal in size and structure. There is borderline dilatation of the ascending aorta, measuring 40 mm. Venous: The inferior vena cava is normal in size with greater than 50% respiratory variability, suggesting right atrial pressure of 3 mmHg. IAS/Shunts: No atrial level shunt detected by color flow Doppler.  LEFT VENTRICLE PLAX 2D LVIDd:         4.00 cm  Diastology LVIDs:         2.60 cm  LV e' medial:    7.40 cm/s LV PW:         1.40 cm  LV E/e' medial:  10.1 LV IVS:        1.00 cm  LV e' lateral:   9.57 cm/s LVOT diam:     2.00 cm  LV E/e' lateral: 7.8 LV SV:         76 LV SV Index:   36       2D Longitudinal Strain LVOT Area:     3.14 cm 2D Strain GLS Avg:     -16.5 %                          3D Volume EF:                         3D EF:        58 %                         LV EDV:       142 ml  LV ESV:       59 ml                         LV SV:        83 ml RIGHT VENTRICLE             IVC RV Basal diam:  2.90 cm     IVC diam: 1.50 cm RV S prime:     12.70 cm/s TAPSE (M-mode): 2.1 cm LEFT ATRIUM             Index       RIGHT ATRIUM           Index LA diam:        4.30 cm 2.06 cm/m  RA Area:     14.60 cm LA Vol (A2C):   69.3 ml 33.16 ml/m RA Volume:   33.40 ml  15.98 ml/m LA Vol (A4C):   49.6 ml 23.74 ml/m LA Biplane Vol: 63.3 ml 30.29 ml/m  AORTIC VALVE LVOT Vmax:   132.00 cm/s LVOT Vmean:  84.300 cm/s LVOT VTI:    0.242 m  AORTA Ao Root diam: 3.10 cm Ao Asc diam:  4.00 cm MITRAL VALVE               TRICUSPID VALVE MV Area (PHT): 2.62 cm     TR Peak grad:   18.8 mmHg MV Decel Time: 289 msec    TR Vmax:        217.00 cm/s MV E velocity: 74.70 cm/s MV A velocity: 92.40 cm/s  SHUNTS MV E/A ratio:  0.81        Systemic VTI:  0.24 m                            Systemic Diam: 2.00 cm Ena Dawley MD Electronically signed by Ena Dawley MD Signature Date/Time: 04/17/2020/12:05:08 PM    Final    VAS [place] UPPER EXTREMITY VENOUS DUPLEX  Result Date: 04/02/2020 UPPER VENOUS STUDY  Indications: Swelling Comparison Study: No prior studies. Performing Technologist: Oliver Hum RVT  Examination Guidelines: A complete evaluation includes B-mode imaging, spectral Doppler, color Doppler, and power Doppler as needed of all accessible portions of each vessel. Bilateral testing is considered an integral part of a complete examination. Limited examinations for reoccurring indications may be performed as noted.  Right Findings: +----------+------------+---------+-----------+----------+-------+ RIGHT     CompressiblePhasicitySpontaneousPropertiesSummary +----------+------------+---------+-----------+----------+-------+ Subclavian    Full       Yes       Yes                      +----------+------------+---------+-----------+----------+-------+  Left Findings: +----------+------------+---------+-----------+----------+-------+ LEFT      CompressiblePhasicitySpontaneousPropertiesSummary +----------+------------+---------+-----------+----------+-------+ IJV           Full       Yes       Yes                      +----------+------------+---------+-----------+----------+-------+ Subclavian    Full       Yes       Yes                      +----------+------------+---------+-----------+----------+-------+ Axillary      Full       Yes       Yes                      +----------+------------+---------+-----------+----------+-------+  Brachial      Full       Yes       Yes                       +----------+------------+---------+-----------+----------+-------+ Radial        Full                                          +----------+------------+---------+-----------+----------+-------+ Ulnar         Full                                          +----------+------------+---------+-----------+----------+-------+ Cephalic      Full                                          +----------+------------+---------+-----------+----------+-------+ Basilic       Full                                          +----------+------------+---------+-----------+----------+-------+  Summary:  Right: No evidence of thrombosis in the subclavian.  Left: No evidence of deep vein thrombosis in the upper extremity. No evidence of superficial vein thrombosis in the upper extremity.  *See table(s) above for measurements and observations.  Diagnosing physician: Monica Martinez MD Electronically signed by Monica Martinez MD on 04/02/2020 at 4:47:11 PM.    Final      ELIGIBLE FOR AVAILABLE RESEARCH PROTOCOL: AET  ASSESSMENT: 64 y.o. [name] [name] woman status post right breast upper outer quadrant (12:00) and axillary lymph node biopsy 02/03/2020, both positive for a clinical T1c N1, stage IIB invasive ductal carcinoma, grade 3, estrogen receptor positive, HER-2 and progesterone receptor negative, with an MIB-1 of 60%.  (a) biopsy of a second right upper outer quadrant T2 lesion (11 o'clock) 02/06/2020 also showed invasive ductal carcinoma, but grade 2, estrogen receptor positive, progesterone receptor and HER-2 negative, with an MIB-1 of 15%  (b) biopsy of a third right breast lesion (1:00) was benign   (1) neoadjuvant chemotherapy consisting of cyclophosphamide and docetaxel every 21 days x 4 starting 03/05/2020  (a) discontinued after 2 cycles because of peripheral neuropathy  (b) cyclophosphamide/doxorubicin started 04/24/2020, to be repeated x4  (c) echo 04/17/2020 shows EF of 55-60%  (2)  definitive surgery to follow  (3) adjuvant radiation  (4) antiestrogens to follow  (5) CT scan of the chest 04/23/2020 shows a left brachiocephalicl catheter associated clot  (a) rivaroxaban started 04/24/2020   PLAN: [place] will start her new chemotherapy today.  I am going to see her again in 7 days to check her nadir counts and to assess tolerance.  She has a clot in the left brachiocephalic which is the reason for her left upper extremity lymphedema.  I am going to start her on rivaroxaban at standard doses.  She has good renal function.  I asked her to stop her aspirin which she tells me she already was not taking.  Rivaroxaban can be very expensive.  I am writing it through her pharmacy in hopes we can obtain some help so  she can receive this medication at a reasonable cost or for free  If she tolerates the Cytoxan and doxorubicin well we can do it every 2 weeks otherwise it will be every 3 weeks.  I hope to be able to get her through 4 cycles of this  Total encounter time 40 minutes.Sarajane Jews C. Magrinat, MD 04/24/20 9:41 AM Medical Oncology and Hematology [facility] [place], [place] [address] Tel. [phone]    Fax. [phone]   I, Wilburn Mylar, am acting as scribe for Dr. Virgie Dad. Magrinat.  I, Lurline Del MD, have reviewed the above documentation for accuracy and completeness, and I agree with the above.   *Total Encounter Time as defined by the Centers for Medicare and Medicaid Services includes, in addition to the face-to-face time of a patient visit (documented in the note above) non-face-to-face time: obtaining and reviewing outside history, ordering and reviewing medications, tests or procedures, care coordination (communications with other health care professionals or caregivers) and documentation in the medical record.

## 2020-04-24 ENCOUNTER — Other Ambulatory Visit: Payer: Self-pay

## 2020-04-24 ENCOUNTER — Inpatient Hospital Stay: Payer: Self-pay

## 2020-04-24 ENCOUNTER — Telehealth: Payer: Self-pay | Admitting: *Deleted

## 2020-04-24 ENCOUNTER — Telehealth: Payer: Self-pay | Admitting: Oncology

## 2020-04-24 ENCOUNTER — Inpatient Hospital Stay (HOSPITAL_BASED_OUTPATIENT_CLINIC_OR_DEPARTMENT_OTHER): Payer: Self-pay | Admitting: Oncology

## 2020-04-24 ENCOUNTER — Encounter: Payer: Self-pay | Admitting: Oncology

## 2020-04-24 ENCOUNTER — Other Ambulatory Visit: Payer: Self-pay | Admitting: Oncology

## 2020-04-24 VITALS — BP 128/71 | HR 99 | Temp 97.6°F | Resp 18 | Ht 66.0 in | Wt 220.0 lb

## 2020-04-24 DIAGNOSIS — Z170 Estrogen receptor positive status [ER+]: Secondary | ICD-10-CM

## 2020-04-24 DIAGNOSIS — C50411 Malignant neoplasm of upper-outer quadrant of right female breast: Secondary | ICD-10-CM

## 2020-04-24 DIAGNOSIS — I82409 Acute embolism and thrombosis of unspecified deep veins of unspecified lower extremity: Secondary | ICD-10-CM | POA: Insufficient documentation

## 2020-04-24 DIAGNOSIS — I700 Atherosclerosis of aorta: Secondary | ICD-10-CM | POA: Insufficient documentation

## 2020-04-24 DIAGNOSIS — I82622 Acute embolism and thrombosis of deep veins of left upper extremity: Secondary | ICD-10-CM | POA: Insufficient documentation

## 2020-04-24 LAB — CBC WITH DIFFERENTIAL/PLATELET
Abs Immature Granulocytes: 0.02 10*3/uL (ref 0.00–0.07)
Basophils Absolute: 0 10*3/uL (ref 0.0–0.1)
Basophils Relative: 0 %
Eosinophils Absolute: 0 10*3/uL (ref 0.0–0.5)
Eosinophils Relative: 0 %
HCT: 35.2 % — ABNORMAL LOW (ref 36.0–46.0)
Hemoglobin: 11.2 g/dL — ABNORMAL LOW (ref 12.0–15.0)
Immature Granulocytes: 1 %
Lymphocytes Relative: 19 %
Lymphs Abs: 0.7 10*3/uL (ref 0.7–4.0)
MCH: 29.3 pg (ref 26.0–34.0)
MCHC: 31.8 g/dL (ref 30.0–36.0)
MCV: 92.1 fL (ref 80.0–100.0)
Monocytes Absolute: 0.1 10*3/uL (ref 0.1–1.0)
Monocytes Relative: 2 %
Neutro Abs: 2.8 10*3/uL (ref 1.7–7.7)
Neutrophils Relative %: 78 %
Platelets: 367 10*3/uL (ref 150–400)
RBC: 3.82 MIL/uL — ABNORMAL LOW (ref 3.87–5.11)
RDW: 16.2 % — ABNORMAL HIGH (ref 11.5–15.5)
WBC: 3.6 10*3/uL — ABNORMAL LOW (ref 4.0–10.5)
nRBC: 0 % (ref 0.0–0.2)

## 2020-04-24 LAB — COMPREHENSIVE METABOLIC PANEL
ALT: 18 U/L (ref 0–44)
AST: 13 U/L — ABNORMAL LOW (ref 15–41)
Albumin: 3.7 g/dL (ref 3.5–5.0)
Alkaline Phosphatase: 89 U/L (ref 38–126)
Anion gap: 11 (ref 5–15)
BUN: 10 mg/dL (ref 8–23)
CO2: 22 mmol/L (ref 22–32)
Calcium: 10 mg/dL (ref 8.9–10.3)
Chloride: 108 mmol/L (ref 98–111)
Creatinine, Ser: 0.79 mg/dL (ref 0.44–1.00)
GFR, Estimated: >60 mL/min (ref >60)
Glucose, Bld: 168 mg/dL — ABNORMAL HIGH (ref 70–99)
Potassium: 4.5 mmol/L (ref 3.5–5.1)
Sodium: 141 mmol/L (ref 135–145)
Total Bilirubin: 0.7 mg/dL (ref 0.3–1.2)
Total Protein: 6.7 g/dL (ref 6.5–8.1)

## 2020-04-24 MED ORDER — SODIUM CHLORIDE 0.9 % IV SOLN
Freq: Once | INTRAVENOUS | Status: AC
  Filled 2020-04-24: qty 250

## 2020-04-24 MED ORDER — PALONOSETRON HCL INJECTION 0.25 MG/5ML
0.2500 mg | Freq: Once | INTRAVENOUS | Status: AC
  Administered 2020-04-24: 11:00:00 0.25 mg via INTRAVENOUS

## 2020-04-24 MED ORDER — SODIUM CHLORIDE 0.9 % IV SOLN
10.0000 mg | Freq: Once | INTRAVENOUS | Status: AC
  Administered 2020-04-24: 11:00:00 10 mg via INTRAVENOUS
  Filled 2020-04-24: qty 10

## 2020-04-24 MED ORDER — HEPARIN SOD (PORK) LOCK FLUSH 100 UNIT/ML IV SOLN
500.0000 [IU] | Freq: Once | INTRAVENOUS | Status: AC | PRN
  Administered 2020-04-24: 13:00:00 500 [IU]
  Filled 2020-04-24: qty 5

## 2020-04-24 MED ORDER — PALONOSETRON HCL INJECTION 0.25 MG/5ML
INTRAVENOUS | Status: AC
  Filled 2020-04-24: qty 5

## 2020-04-24 MED ORDER — DOXORUBICIN HCL CHEMO IV INJECTION 2 MG/ML
60.0000 mg/m2 | Freq: Once | INTRAVENOUS | Status: AC
  Administered 2020-04-24: 12:00:00 130 mg via INTRAVENOUS
  Filled 2020-04-24: qty 65

## 2020-04-24 MED ORDER — SODIUM CHLORIDE 0.9 % IV SOLN
600.0000 mg/m2 | Freq: Once | INTRAVENOUS | Status: AC
  Administered 2020-04-24: 12:00:00 1300 mg via INTRAVENOUS
  Filled 2020-04-24: qty 65

## 2020-04-24 MED ORDER — RIVAROXABAN 15 MG PO TABS: 15.0000 mg | ORAL_TABLET | Freq: Two times a day (BID) | ORAL | 0 refills | Status: DC

## 2020-04-24 MED ORDER — SODIUM CHLORIDE 0.9% FLUSH
10.0000 mL | INTRAVENOUS | Status: DC | PRN
  Administered 2020-04-24: 13:00:00 10 mL
  Filled 2020-04-24: qty 10

## 2020-04-24 MED ORDER — SODIUM CHLORIDE 0.9 % IV SOLN
150.0000 mg | Freq: Once | INTRAVENOUS | Status: AC
  Administered 2020-04-24: 11:00:00 150 mg via INTRAVENOUS
  Filled 2020-04-24: qty 150

## 2020-04-24 MED FILL — XARELTO 15 MG TABLET: 15 | 21 days supply | Qty: 42 | Fill #0

## 2020-04-24 NOTE — Progress Notes (Signed)
Blood return noted before and after adriamycin.   Patient discharged in stable condition with no complaints.

## 2020-04-24 NOTE — Telephone Encounter (Signed)
Scheduled appts per 12/14 los. Gave pt a print out of AVS.  

## 2020-04-24 NOTE — Progress Notes (Signed)
Met with patient at registration to obtain signature for the [employer].  Discussed in detail expenses and how they are covered. Gave her a copy of the approval letter and expense sheet along with the Outpatient pharmacy information. She received a gas card today from her grant.  She has my card for any additional financial questions or concerns.

## 2020-04-24 NOTE — Patient Instructions (Signed)
[facility] Discharge Instructions for Patients Receiving Chemotherapy  Today you received the following chemotherapy agents Adriamyicin; cytoxin  To help prevent nausea and vomiting after your treatment, we encourage you to take your nausea medication as directed   If you develop nausea and vomiting that is not controlled by your nausea medication, call the clinic.   BELOW ARE SYMPTOMS THAT SHOULD BE REPORTED IMMEDIATELY:  *FEVER GREATER THAN 100.5 F  *CHILLS WITH OR WITHOUT FEVER  NAUSEA AND VOMITING THAT IS NOT CONTROLLED WITH YOUR NAUSEA MEDICATION  *UNUSUAL SHORTNESS OF BREATH  *UNUSUAL BRUISING OR BLEEDING  TENDERNESS IN MOUTH AND THROAT WITH OR WITHOUT PRESENCE OF ULCERS  *URINARY PROBLEMS  *BOWEL PROBLEMS  UNUSUAL RASH Items with * indicate a potential emergency and should be followed up as soon as possible.  Feel free to call the clinic should you have any questions or concerns. The clinic phone number is [phone].  Please show the [facility] at check-in to the Emergency Department and triage nurse.  Doxorubicin injection What is this medicine? DOXORUBICIN (dox oh ROO bi sin) is a chemotherapy drug. It is used to treat many kinds of cancer like leukemia, lymphoma, neuroblastoma, sarcoma, and Wilms' tumor. It is also used to treat bladder cancer, breast cancer, lung cancer, ovarian cancer, stomach cancer, and thyroid cancer. This medicine may be used for other purposes; ask your health care provider or pharmacist if you have questions. COMMON BRAND NAME(S): Adriamycin, Adriamycin PFS, Adriamycin RDF, Rubex What should I tell my health care provider before I take this medicine? They need to know if you have any of these conditions:  heart disease  history of low blood counts caused by a medicine  liver disease  recent or ongoing radiation therapy  an unusual or allergic reaction to doxorubicin, other chemotherapy agents, other  medicines, foods, dyes, or preservatives  pregnant or trying to get pregnant  breast-feeding How should I use this medicine? This drug is given as an infusion into a vein. It is administered in a hospital or clinic by a specially trained health care professional. If you have pain, swelling, burning or any unusual feeling around the site of your injection, tell your health care professional right away. Talk to your pediatrician regarding the use of this medicine in children. Special care may be needed. Overdosage: If you think you have taken too much of this medicine contact a poison control center or emergency room at once. NOTE: This medicine is only for you. Do not share this medicine with others. What if I miss a dose? It is important not to miss your dose. Call your doctor or health care professional if you are unable to keep an appointment. What may interact with this medicine? This medicine may interact with the following medications:  6-mercaptopurine  paclitaxel  phenytoin  St. John's Wort  trastuzumab  verapamil This list may not describe all possible interactions. Give your health care provider a list of all the medicines, herbs, non-prescription drugs, or dietary supplements you use. Also tell them if you smoke, drink alcohol, or use illegal drugs. Some items may interact with your medicine. What should I watch for while using this medicine? This drug may make you feel generally unwell. This is not uncommon, as chemotherapy can affect healthy cells as well as cancer cells. Report any side effects. Continue your course of treatment even though you feel ill unless your doctor tells you to stop. There is a maximum amount of  this medicine you should receive throughout your life. The amount depends on the medical condition being treated and your overall health. Your doctor will watch how much of this medicine you receive in your lifetime. Tell your doctor if you have taken this  medicine before. You may need blood work done while you are taking this medicine. Your urine may turn red for a few days after your dose. This is not blood. If your urine is dark or brown, call your doctor. In some cases, you may be given additional medicines to help with side effects. Follow all directions for their use. Call your doctor or health care professional for advice if you get a fever, chills or sore throat, or other symptoms of a cold or flu. Do not treat yourself. This drug decreases your body's ability to fight infections. Try to avoid being around people who are sick. This medicine may increase your risk to bruise or bleed. Call your doctor or health care professional if you notice any unusual bleeding. Talk to your doctor about your risk of cancer. You may be more at risk for certain types of cancers if you take this medicine. Do not become pregnant while taking this medicine or for 6 months after stopping it. Women should inform their doctor if they wish to become pregnant or think they might be pregnant. Men should not father a child while taking this medicine and for 6 months after stopping it. There is a potential for serious side effects to an unborn child. Talk to your health care professional or pharmacist for more information. Do not breast-feed an infant while taking this medicine. This medicine has caused ovarian failure in some women and reduced sperm counts in some men This medicine may interfere with the ability to have a child. Talk with your doctor or health care professional if you are concerned about your fertility. This medicine may cause a decrease in Co-Enzyme Q-10. You should make sure that you get enough Co-Enzyme Q-10 while you are taking this medicine. Discuss the foods you eat and the vitamins you take with your health care professional. What side effects may I notice from receiving this medicine? Side effects that you should report to your doctor or health care  professional as soon as possible:  allergic reactions like skin rash, itching or hives, swelling of the face, lips, or tongue  breathing problems  chest pain  fast or irregular heartbeat  low blood counts - this medicine may decrease the number of white blood cells, red blood cells and platelets. You may be at increased risk for infections and bleeding.  pain, redness, or irritation at site where injected  signs of infection - fever or chills, cough, sore throat, pain or difficulty passing urine  signs of decreased platelets or bleeding - bruising, pinpoint red spots on the skin, black, tarry stools, blood in the urine  swelling of the ankles, feet, hands  tiredness  weakness Side effects that usually do not require medical attention (report to your doctor or health care professional if they continue or are bothersome):  diarrhea  hair loss  mouth sores  nail discoloration or damage  nausea  red colored urine  vomiting This list may not describe all possible side effects. Call your doctor for medical advice about side effects. You may report side effects to FDA at 1-800-FDA-1088. Where should I keep my medicine? This drug is given in a hospital or clinic and will not be stored at home. NOTE:  This sheet is a summary. It may not cover all possible information. If you have questions about this medicine, talk to your doctor, pharmacist, or health care provider.  2020 [employer]vier/[employer] Standard (2016-12-10 11:01:26)

## 2020-04-24 NOTE — Telephone Encounter (Signed)
No entry 

## 2020-04-25 ENCOUNTER — Telehealth: Payer: Self-pay | Admitting: *Deleted

## 2020-04-25 ENCOUNTER — Encounter: Payer: Self-pay | Admitting: Licensed Clinical Social Worker

## 2020-04-25 NOTE — Progress Notes (Signed)
[facility] CSW Progress Note  Clinical Education officer, museum contacted daughter by phone to follow-up on resources. Pt's Medicaid application was denied. Sherie from [employer] trying to determine cause. CSW gave information to family on [place] Navigator consortium to look into [employer]. Also reached out to [employer] for cone assistance information.  Per daughter, they are still able to manage other non-medical bills, but are open to exploring cancer foundation assistance. CSW e-mailed information as requested.    Christeen Douglas , LCSW

## 2020-04-26 ENCOUNTER — Other Ambulatory Visit: Payer: Self-pay

## 2020-04-26 ENCOUNTER — Inpatient Hospital Stay: Payer: Self-pay

## 2020-04-26 ENCOUNTER — Encounter: Payer: Self-pay | Admitting: Oncology

## 2020-04-26 VITALS — BP 131/76 | HR 70 | Temp 98.6°F | Resp 18

## 2020-04-26 DIAGNOSIS — Z170 Estrogen receptor positive status [ER+]: Secondary | ICD-10-CM

## 2020-04-26 MED ORDER — PEGFILGRASTIM-CBQV 6 MG/0.6ML ~~LOC~~ SOSY
PREFILLED_SYRINGE | SUBCUTANEOUS | Status: AC
  Filled 2020-04-26: qty 0.6

## 2020-04-26 MED ORDER — PEGFILGRASTIM-CBQV 6 MG/0.6ML ~~LOC~~ SOSY
6.0000 mg | PREFILLED_SYRINGE | Freq: Once | SUBCUTANEOUS | Status: AC
  Administered 2020-04-26: 12:00:00 6 mg via SUBCUTANEOUS

## 2020-04-26 NOTE — Patient Instructions (Signed)

## 2020-04-26 NOTE — Progress Notes (Signed)
Received communication from Education officer, museum that patient was not eligible for Medicaid per [employer].   Gave patient CAFA application to apply for hospital discount. Advised all uninsured patients automatically receive a 56% discount for being uninsured and that application is to determine if she is eligible for any more than the 56% discount. Advised she may return to address listed below or to me to be sent to Customer Service via interoffice mail for processing. They will notify her via mail with determination.  She has my card for any additional financial questions or concerns.

## 2020-04-27 ENCOUNTER — Telehealth: Payer: Self-pay

## 2020-04-27 NOTE — Telephone Encounter (Signed)
Patient's daughter, [name], informed that Medicaid application was denied due to patient having less than 5 years of citizenship, per DSS, should check with Market[employer] ([email or web address]) for insurance coverage. [name] stated that she was provided other links for assistance from the Navigator RN. Informed I will call with any changes. [name] verbalized understanding.

## 2020-04-30 NOTE — Progress Notes (Signed)
[place]  [phone] [phone]     ID: [name] [name] DOB: [birth year]  MR#: [id number]  QIH#:[id number]  Patient Care Team: Kristie Cowman, MD as PCP - General (Family Medicine) Mauro Kaufmann, RN as Oncology Nurse Navigator Rockwell Germany, RN as Oncology Nurse Navigator Duchess Armendarez, Virgie Dad, MD as Consulting Physician (Oncology) Erroll Luna, MD as Consulting Physician (General Surgery) Eppie Gibson, MD as Attending Physician (Radiation Oncology) Chauncey Cruel, MD OTHER MD:  CHIEF COMPLAINT: Estrogen receptor positive breast cancer  CURRENT TREATMENT: Neoadjuvant chemotherapy   INTERVAL HISTORY: [name] [name] returns today for follow up and treatment of her estrogen receptor positive breast cancer.   We are switching her chemotherapy to cyclophosphamide and doxorubicin because of her problems with neuropathy, and we will do 2-4 cycles of those agents, every 2 or 3 weeks depending on tolerance and evidence of response.  Today is day 8 cycle one of her cyclophosphamide/ doxorubicin treatments  Her most recent echocardiogram on 04/17/2020 showed an ejection fraction of 55-60%.   REVIEW OF SYSTEMS: [name] [name] tolerated the CA chemotherapy generally well.  She gets some tension headaches at times.  She has had fewer nosebleeds she says with this treatment.  Also her peripheral neuropathy is improving.  She sleeps a lot.  She had a little bit of diarrhea yesterday.  She did not take Imodium for that.  Of course she has lost her hair.  She is using bandanna sun hats to take care of that.  Aside from those issues a detailed review of systems today was stable   COVID 19 VACCINATION STATUS: s/p    X2, most recently June 2021, no booster as of 05/01/2020   HISTORY OF CURRENT ILLNESS: From the original intake note:  [name] [name] herself palpated a mass in the right axilla "a long time ago", more recently with complaints of associated pain. She brought it to  medical attention and underwent bilateral diagnostic mammography with tomography and bilateral breast ultrasonography at [facility] on 01/31/2020 showing: breast density category B; three right breast masses-- 1.9 cm at 12 o'clock, 2.3 cm at 11 o'clock, 0.4 cm at 1 o'clock; two adjacent enlarged thickened lymph nodes measuring up to 2.1 cm; no evidence of left breast malignancy.  Accordingly on 02/03/2020 she proceeded to biopsy of the right breast area at 12 o'clock and right axilla. The pathology from this procedure ([id number]) showed: invasive ductal carcinoma, grade 3. Prognostic indicators significant for: estrogen receptor, 100% positive with strong staining intensity and progesterone receptor, 0% negative. Proliferation marker Ki67 at 60%. HER2 equivocal by immunohistochemistry (2+), but negative by fluorescent in situ hybridization with a signals ratio 0.81 and number per cell 2.7.  The biopsied lymph node was found to show metastatic carcinoma. The morphology was considered different from the biopsied mass, and a prognostic panel was performed. Estrogen receptor, 100% positive with strong staining intensity and progesterone receptor, 0% negative. Proliferation marker Ki67 at 70%. HER2 equivcoal by immunohistochemistry (2+), but negative) by fluorescent in situ hybridization with a signals ratio 1.16 and number per cell 3.9.  She underwent additional right breast biopsies on 02/06/2020. Pathology [phone]) from the mass at 11 o'clock showed: invasive ductal carcinoma, grade 2. Prognostic indicators significant for: estrogen receptor, 100% positive with strong staining intensity and progesterone receptor, 0% negative. Proliferation marker Ki67 at 15%. HER2 equivocal by immunohistochemistry (2+), but negative by fluorescent in situ hybridization with a signals ratio 1.29 and number per cell 2.7.  The biopsied mass  at 1 o'clock showed only fibrocystic change.  The patient's subsequent history  is as detailed below.   PAST MEDICAL HISTORY: Past Medical History:  Diagnosis Date  . Arthritis   . Diabetes mellitus without complication ([place])   . Hypertension     PAST SURGICAL HISTORY: Past Surgical History:  Procedure Laterality Date  . IR IMAGING GUIDED PORT INSERTION  02/23/2020    FAMILY HISTORY: Family History  Problem Relation Age of Onset  . Asthma Mother   . Arthritis Sister   . Asthma Brother   . Arthritis Brother   [name] died in his 1s from causes unknown to the patient.  [name] died from heart disease at age 78.  The patient had 4 brothers, 1 sister, with no history of cancer in the family to her knowledge   GYNECOLOGIC HISTORY:  No LMP recorded. Patient is postmenopausal. Menarche: 64 years old Age at first live birth: 64 years old [place] P 1 LMP 32s Contraceptive HRT no  Hysterectomy? no BSO? no   SOCIAL HISTORY: (updated 02/2020)  [name] [name] is originally from [place].  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  She describes herself as single.  At home she lives with her daughter [name] who works for [employer], her daughters husband [name] who is a English as a second language teacher, and their children who are 71 months old and 61 years old.  The patient is in [place]: Not in place.  The patient tells me she would intend to name her daughter [name] as her healthcare power of attorney   HEALTH MAINTENANCE: Social History   Tobacco Use  . Smoking status: Never Smoker  . Smokeless tobacco: Never Used  Vaping Use  . Vaping Use: Never used  Substance Use Topics  . Alcohol use: Yes    Alcohol/week: 1.0 standard drink    Types: 1 Glasses of wine per week    Comment: occasionally  . Drug use: Never     Colonoscopy: never done  PAP: 2017 (performed in [place])  Bone density:    Allergies  Allergen Reactions  . Sulfa Antibiotics Itching    Current Outpatient Medications  Medication Sig  Dispense Refill  . acetaminophen (TYLENOL) 500 MG tablet Take 500 mg by mouth 3 (three) times daily as needed.    Marland Kitchen amLODipine (NORVASC) 5 MG tablet Take 5 mg by mouth daily.    Marland Kitchen atorvastatin (LIPITOR) 40 MG tablet Take 40 mg by mouth daily.    . carvedilol (COREG) 6.25 MG tablet Take 6.25 mg by mouth 2 (two) times daily with a meal.    . celecoxib (CELEBREX) 200 MG capsule Take 200 mg by mouth daily.     Marland Kitchen dexamethasone (DECADRON) 4 MG tablet Take 2 tablets BID the day before chemotherapy, then 2 tablets BID for three days after chemotherapy.  Then take 1 tablet daily x 5 days, then stop. 30 tablet 0  . dexamethasone (DECADRON) 4 MG tablet Take 2 tablets (8 mg total) by mouth daily. Take daily for 3 days after chemo. Take with food. 30 tablet 1  . fluconazole (DIFLUCAN) 200 MG tablet Take 1 tablet (200 mg total) by mouth daily. (Patient not taking: Reported on 03/26/2020) 30 tablet 0  . lansoprazole (PREVACID) 30 MG capsule Take 30 mg by mouth every morning.    Marland Kitchen losartan (COZAAR) 100 MG tablet Take 100 mg by mouth daily.    . metFORMIN (GLUCOPHAGE)  1000 MG tablet Take 1,000 mg by mouth daily with breakfast.     . prochlorperazine (COMPAZINE) 10 MG tablet Take 1 tablet (10 mg total) by mouth every 6 (six) hours as needed (Nausea or vomiting). 30 tablet 1  . Rivaroxaban (XARELTO) 15 MG TABS tablet Take 1 tablet (15 mg total) by mouth 2 (two) times daily with a meal. 42 tablet 0   No current facility-administered medications for this visit.    OBJECTIVE: African-American woman who appears older than stated age  39:   05/01/20 0952  BP: (!) 106/59  Pulse: 77  Resp: 17  Temp: 97.9 F (36.6 C)  SpO2: 100%     Body mass index is 34.52 kg/m.   Wt Readings from Last 3 Encounters:  05/01/20 213 lb 14.4 oz (97 kg)  04/24/20 220 lb (99.8 kg)  04/16/20 221 lb 12.8 oz (100.6 kg)      ECOG FS:2 - Symptomatic, <50% confined to bed  Sclerae unicteric, EOMs intact Wearing a mask No  cervical or supraclavicular adenopathy Lungs no rales or rhonchi Heart regular rate and rhythm Abd soft, nontender, positive bowel sounds MSK no focal spinal tenderness, no upper extremity lymphedema Neuro: nonfocal, well oriented, appropriate affect Breasts: I no longer feel a mass in the right breast.   LAB RESULTS:  CMP     Component Value Date/Time   NA 141 04/24/2020 0852   K 4.5 04/24/2020 0852   CL 108 04/24/2020 0852   CO2 22 04/24/2020 0852   GLUCOSE 168 (H) 04/24/2020 0852   BUN 10 04/24/2020 0852   CREATININE 0.79 04/24/2020 0852   CREATININE 0.76 04/02/2020 0900   CALCIUM 10.0 04/24/2020 0852   PROT 6.7 04/24/2020 0852   ALBUMIN 3.7 04/24/2020 0852   AST 13 (L) 04/24/2020 0852   AST 10 (L) 04/02/2020 0900   ALT 18 04/24/2020 0852   ALT 14 04/02/2020 0900   ALKPHOS 89 04/24/2020 0852   BILITOT 0.7 04/24/2020 0852   BILITOT 0.6 04/02/2020 0900   GFRNONAA >60 04/24/2020 0852   GFRNONAA >60 04/02/2020 0900    No results found for: TOTALPROTELP, ALBUMINELP, A1GS, A2GS, BETS, BETA2SER, GAMS, MSPIKE, SPEI  Lab Results  Component Value Date   WBC 2.5 (L) 05/01/2020   NEUTROABS PENDING 05/01/2020   HGB 11.3 (L) 05/01/2020   HCT 34.2 (L) 05/01/2020   MCV 90.0 05/01/2020   PLT 165 05/01/2020    No results found for: LABCA2  No components found for: [id number]  No results for input(s): INR in the last 168 hours.  No results found for: LABCA2  No results found for: XNT700  No results found for: FVC944  No results found for: HQP591  No results found for: CA2729  No components found for: HGQUANT  No results found for: CEA1 / No results found for: CEA1   No results found for: AFPTUMOR  No results found for: CHROMOGRNA  No results found for: KPAFRELGTCHN, LAMBDASER, KAPLAMBRATIO (kappa/lambda light chains)  No results found for: HGBA, HGBA2QUANT, HGBFQUANT, HGBSQUAN (Hemoglobinopathy evaluation)   No results found for: LDH  No results found  for: IRON, TIBC, IRONPCTSAT (Iron and TIBC)  No results found for: FERRITIN  Urinalysis No results found for: COLORURINE, APPEARANCEUR, LABSPEC, PHURINE, GLUCOSEU, HGBUR, BILIRUBINUR, KETONESUR, PROTEINUR, UROBILINOGEN, NITRITE, LEUKOCYTESUR   STUDIES: CT Chest W Contrast  Result Date: 04/24/2020 CLINICAL DATA:  Left upper extremity swelling. Estrogen receptor positive breast cancer on neoadjuvant chemotherapy. EXAM: CT CHEST WITH CONTRAST TECHNIQUE: Multidetector CT imaging  of the chest was performed during intravenous contrast administration. CONTRAST:  31m OMNIPAQUE IOHEXOL 300 MG/ML  SOLN COMPARISON:  None. FINDINGS: Cardiovascular: Normal heart size. No significant pericardial effusion/thickening. Left internal jugular Port-A-Cath terminates in the lower third of the SVC. Suggestion of a small nonocclusive catheter associated thrombus in the left brachiocephalic vein (series 2/image 31). Atherosclerotic nonaneurysmal thoracic aorta. Top-normal caliber main pulmonary artery (3.3 cm diameter). No central pulmonary emboli. Mediastinum/Nodes: No discrete thyroid nodules. Unremarkable esophagus. Multiple mildly enlarged right axillary lymph nodes, largest with short axis diameter 1.4 cm (series 2/image 36). No left axillary adenopathy. Lungs/Pleura: No pneumothorax. No pleural effusion. No acute consolidative airspace disease or lung masses. Tiny 2 mm posterior right upper lobe pulmonary nodule (series 5/image 27). No additional significant pulmonary nodules. Upper abdomen: Small hiatal hernia. Musculoskeletal: No aggressive appearing focal osseous lesions. Clustered right breast 1.5 cm and 1.4 cm soft tissue masses (series 2/images 60 and 58, respectively). Moderate lower thoracic spondylosis. IMPRESSION: 1. Suggestion of a small nonocclusive catheter associated thrombus in the left brachiocephalic vein. Left internal jugular Port-A-Cath is well positioned with tip in the lower third of the SVC. 2.  Clustered right breast soft tissue masses, presumably representing known primary breast malignancy. 3. Multiple mildly enlarged right axillary lymph nodes, compatible with metastatic disease. 4. Tiny 2 mm posterior right upper lobe pulmonary nodule, for which 3 month chest CT follow-up is recommended. 5. Small hiatal hernia. 6. Aortic Atherosclerosis (ICD10-I70.0). These results will be called to the ordering clinician or representative by the Radiologist Assistant, and communication documented in the PACS or [employer] Electronically Signed   By: JIlona SorrelM.D.   On: 04/24/2020 09:10   ECHOCARDIOGRAM COMPLETE  Result Date: 04/17/2020    ECHOCARDIOGRAM REPORT   [facility] of Exam: 04/17/2020 Medical Rec #:  [id number]  Height:       66.0 in Accession #:    [id number] Weight:       221.8 lb Date of Birth:  1September 26, 1957 BSA:          2.090 m Patient Age:    89years    BP:           97/59 mmHg Patient Gender: F           HR:           69 bpm. Exam Location:  Outpatient Procedure: 2D Echo, 3D Echo, Cardiac Doppler, Color Doppler and Strain Analysis Indications:    Z51.11 Encounter for antineoplastic chemotheraphy  History:        Patient has no prior history of Echocardiogram examinations.                 Risk Factors:Hypertension and Diabetes.  Sonographer:    TJonelle SidleDance Referring Phys: 8[place] 1. Left ventricular ejection fraction, by estimation, is 55 to 60%. The left ventricle has normal function. The left ventricle has no regional wall motion abnormalities. There is mild concentric left ventricular hypertrophy. Left ventricular diastolic parameters are consistent with Grade I diastolic dysfunction (impaired relaxation). The average left ventricular global longitudinal strain is -16.5 %. The global longitudinal strain is abnormal.  2. Right ventricular systolic function is normal. The right ventricular size is normal. There is normal pulmonary  artery systolic pressure.  3. Left atrial size was mildly dilated.  4. The mitral valve is normal in structure. Trivial mitral valve regurgitation. No evidence of mitral stenosis.  5. The aortic valve is normal in structure. Aortic valve regurgitation is not visualized. No aortic stenosis is present.  6. There is borderline dilatation of the ascending aorta, measuring 40 mm.  7. The inferior vena cava is normal in size with greater than 50% respiratory variability, suggesting right atrial pressure of 3 mmHg. Comparison(s): No prior Echocardiogram. FINDINGS  Left Ventricle: Left ventricular ejection fraction, by estimation, is 55 to 60%. The left ventricle has normal function. The left ventricle has no regional wall motion abnormalities. The average left ventricular global longitudinal strain is -16.5 %. The global longitudinal strain is abnormal. The left ventricular internal cavity size was normal in size. There is mild concentric left ventricular hypertrophy. Left ventricular diastolic parameters are consistent with Grade I diastolic dysfunction (impaired relaxation). Normal left ventricular filling pressure. Right Ventricle: The right ventricular size is normal. No increase in right ventricular wall thickness. Right ventricular systolic function is normal. There is normal pulmonary artery systolic pressure. The tricuspid regurgitant velocity is 2.17 m/s, and  with an assumed right atrial pressure of 3 mmHg, the estimated right ventricular systolic pressure is 89.1 mmHg. Left Atrium: Left atrial size was mildly dilated. Right Atrium: Right atrial size was normal in size. Pericardium: There is no evidence of pericardial effusion. Mitral Valve: The mitral valve is normal in structure. Trivial mitral valve regurgitation. No evidence of mitral valve stenosis. Tricuspid Valve: The tricuspid valve is normal in structure. Tricuspid valve regurgitation is mild . No evidence of tricuspid stenosis. Aortic Valve: The aortic  valve is normal in structure. Aortic valve regurgitation is not visualized. No aortic stenosis is present. Pulmonic Valve: The pulmonic valve was normal in structure. Pulmonic valve regurgitation is not visualized. No evidence of pulmonic stenosis. Aorta: The aortic root is normal in size and structure. There is borderline dilatation of the ascending aorta, measuring 40 mm. Venous: The inferior vena cava is normal in size with greater than 50% respiratory variability, suggesting right atrial pressure of 3 mmHg. IAS/Shunts: No atrial level shunt detected by color flow Doppler.  LEFT VENTRICLE PLAX 2D LVIDd:         4.00 cm  Diastology LVIDs:         2.60 cm  LV e' medial:    7.40 cm/s LV PW:         1.40 cm  LV E/e' medial:  10.1 LV IVS:        1.00 cm  LV e' lateral:   9.57 cm/s LVOT diam:     2.00 cm  LV E/e' lateral: 7.8 LV SV:         76 LV SV Index:   36       2D Longitudinal Strain LVOT Area:     3.14 cm 2D Strain GLS Avg:     -16.5 %                          3D Volume EF:                         3D EF:        58 %                         LV EDV:       142 ml  LV ESV:       59 ml                         LV SV:        83 ml RIGHT VENTRICLE             IVC RV Basal diam:  2.90 cm     IVC diam: 1.50 cm RV S prime:     12.70 cm/s TAPSE (M-mode): 2.1 cm LEFT ATRIUM             Index       RIGHT ATRIUM           Index LA diam:        4.30 cm 2.06 cm/m  RA Area:     14.60 cm LA Vol (A2C):   69.3 ml 33.16 ml/m RA Volume:   33.40 ml  15.98 ml/m LA Vol (A4C):   49.6 ml 23.74 ml/m LA Biplane Vol: 63.3 ml 30.29 ml/m  AORTIC VALVE LVOT Vmax:   132.00 cm/s LVOT Vmean:  84.300 cm/s LVOT VTI:    0.242 m  AORTA Ao Root diam: 3.10 cm Ao Asc diam:  4.00 cm MITRAL VALVE               TRICUSPID VALVE MV Area (PHT): 2.62 cm    TR Peak grad:   18.8 mmHg MV Decel Time: 289 msec    TR Vmax:        217.00 cm/s MV E velocity: 74.70 cm/s MV A velocity: 92.40 cm/s  SHUNTS MV E/A ratio:  0.81        Systemic  VTI:  0.24 m                            Systemic Diam: 2.00 cm [name] Dawley MD Electronically signed by [name] Dawley MD Signature Date/Time: 04/17/2020/12:05:08 PM    Final    VAS [place] UPPER EXTREMITY VENOUS DUPLEX  Result Date: 04/02/2020 UPPER VENOUS STUDY  Indications: Swelling Comparison Study: No prior studies. Performing Technologist: Oliver Hum RVT  Examination Guidelines: A complete evaluation includes B-mode imaging, spectral Doppler, color Doppler, and power Doppler as needed of all accessible portions of each vessel. Bilateral testing is considered an integral part of a complete examination. Limited examinations for reoccurring indications may be performed as noted.  Right Findings: +----------+------------+---------+-----------+----------+-------+ RIGHT     CompressiblePhasicitySpontaneousPropertiesSummary +----------+------------+---------+-----------+----------+-------+ Subclavian    Full       Yes       Yes                      +----------+------------+---------+-----------+----------+-------+  Left Findings: +----------+------------+---------+-----------+----------+-------+ LEFT      CompressiblePhasicitySpontaneousPropertiesSummary +----------+------------+---------+-----------+----------+-------+ IJV           Full       Yes       Yes                      +----------+------------+---------+-----------+----------+-------+ Subclavian    Full       Yes       Yes                      +----------+------------+---------+-----------+----------+-------+ Axillary      Full       Yes       Yes                      +----------+------------+---------+-----------+----------+-------+  Brachial      Full       Yes       Yes                      +----------+------------+---------+-----------+----------+-------+ Radial        Full                                          +----------+------------+---------+-----------+----------+-------+ Ulnar          Full                                          +----------+------------+---------+-----------+----------+-------+ Cephalic      Full                                          +----------+------------+---------+-----------+----------+-------+ Basilic       Full                                          +----------+------------+---------+-----------+----------+-------+  Summary:  Right: No evidence of thrombosis in the subclavian.  Left: No evidence of deep vein thrombosis in the upper extremity. No evidence of superficial vein thrombosis in the upper extremity.  *See table(s) above for measurements and observations.  Diagnosing physician: Monica Martinez MD Electronically signed by Monica Martinez MD on 04/02/2020 at 4:47:11 PM.    Final      ELIGIBLE FOR AVAILABLE RESEARCH PROTOCOL: AET  ASSESSMENT: 64 y.o. [place] woman status post right breast upper outer quadrant (12:00) and axillary lymph node biopsy 02/03/2020, both positive for a clinical T1c N1, stage IIB invasive ductal carcinoma, grade 3, estrogen receptor positive, HER-2 and progesterone receptor negative, with an MIB-1 of 60%.  (a) biopsy of a second right upper outer quadrant T2 lesion (11 o'clock) 02/06/2020 also showed invasive ductal carcinoma, but grade 2, estrogen receptor positive, progesterone receptor and HER-2 negative, with an MIB-1 of 15%  (b) biopsy of a third right breast lesion (1:00) was benign   (1) neoadjuvant chemotherapy consisting of cyclophosphamide and docetaxel every 21 days x 4 starting 03/05/2020  (a) discontinued after 2 cycles because of peripheral neuropathy  (b) cyclophosphamide/doxorubicin started 04/24/2020, to be repeated x4  (c) echo 04/17/2020 shows EF of 55-60%  (2) definitive surgery to follow  (3) adjuvant radiation  (4) antiestrogens to follow  (5) CT scan of the chest 04/23/2020 shows a left brachiocephalicl catheter associated clot  (a) rivaroxaban started  04/24/2020   PLAN: Aline tolerated her first cycle of cyclophosphamide/doxorubicin without any unusual side effects.  Her counts today are adequate.  She will not need between treatment antibiotics prophylactically.  I would like to give her a total of 4 cycles of cyclophosphamide/doxorubicin assuming she took continues to tolerate them as well as she has so far.  She is already scheduled for treatment next week in January 10 and I have added a final treatment on January 24.  Am very encouraged by clinical exam there is no palpable mass at present  I have asked her to make sure to call us if  she develops a fever or any other symptom of concern  Total encounter time 25 minutes.Sarajane Jews C. Ardra Kuznicki, MD 05/01/20 10:11 AM Medical Oncology and Hematology [facility] [place], [place] [address] Tel. [phone]    Fax. [phone]   I, Wilburn Mylar, am acting as scribe for Dr. Virgie Dad. Mayela Bullard.  I, Lurline Del MD, have reviewed the above documentation for accuracy and completeness, and I agree with the above.   *Total Encounter Time as defined by the Centers for Medicare and Medicaid Services includes, in addition to the face-to-face time of a patient visit (documented in the note above) non-face-to-face time: obtaining and reviewing outside history, ordering and reviewing medications, tests or procedures, care coordination (communications with other health care professionals or caregivers) and documentation in the medical record.

## 2020-05-01 ENCOUNTER — Telehealth: Payer: Self-pay | Admitting: Oncology

## 2020-05-01 ENCOUNTER — Other Ambulatory Visit: Payer: Self-pay

## 2020-05-01 ENCOUNTER — Inpatient Hospital Stay: Payer: Self-pay

## 2020-05-01 ENCOUNTER — Inpatient Hospital Stay (HOSPITAL_BASED_OUTPATIENT_CLINIC_OR_DEPARTMENT_OTHER): Payer: Self-pay | Admitting: Oncology

## 2020-05-01 VITALS — BP 106/59 | HR 77 | Temp 97.9°F | Resp 17 | Ht 66.0 in | Wt 213.9 lb

## 2020-05-01 DIAGNOSIS — C50411 Malignant neoplasm of upper-outer quadrant of right female breast: Secondary | ICD-10-CM

## 2020-05-01 DIAGNOSIS — Z170 Estrogen receptor positive status [ER+]: Secondary | ICD-10-CM

## 2020-05-01 DIAGNOSIS — I82622 Acute embolism and thrombosis of deep veins of left upper extremity: Secondary | ICD-10-CM

## 2020-05-01 DIAGNOSIS — Z95828 Presence of other vascular implants and grafts: Secondary | ICD-10-CM

## 2020-05-01 LAB — CBC WITH DIFFERENTIAL/PLATELET
Abs Immature Granulocytes: 0.03 10*3/uL (ref 0.00–0.07)
Basophils Absolute: 0.1 10*3/uL (ref 0.0–0.1)
Basophils Relative: 2 %
Eosinophils Absolute: 0.1 10*3/uL (ref 0.0–0.5)
Eosinophils Relative: 2 %
HCT: 34.2 % — ABNORMAL LOW (ref 36.0–46.0)
Hemoglobin: 11.3 g/dL — ABNORMAL LOW (ref 12.0–15.0)
Immature Granulocytes: 1 %
Lymphocytes Relative: 35 %
Lymphs Abs: 0.9 10*3/uL (ref 0.7–4.0)
MCH: 29.7 pg (ref 26.0–34.0)
MCHC: 33 g/dL (ref 30.0–36.0)
MCV: 90 fL (ref 80.0–100.0)
Monocytes Absolute: 0.1 10*3/uL (ref 0.1–1.0)
Monocytes Relative: 4 %
Neutro Abs: 1.4 10*3/uL — ABNORMAL LOW (ref 1.7–7.7)
Neutrophils Relative %: 56 %
Platelets: 165 10*3/uL (ref 150–400)
RBC: 3.8 MIL/uL — ABNORMAL LOW (ref 3.87–5.11)
RDW: 16.1 % — ABNORMAL HIGH (ref 11.5–15.5)
WBC: 2.5 10*3/uL — ABNORMAL LOW (ref 4.0–10.5)
nRBC: 0 % (ref 0.0–0.2)

## 2020-05-01 LAB — COMPREHENSIVE METABOLIC PANEL
ALT: 12 U/L (ref 0–44)
AST: 12 U/L — ABNORMAL LOW (ref 15–41)
Albumin: 3.5 g/dL (ref 3.5–5.0)
Alkaline Phosphatase: 98 U/L (ref 38–126)
Anion gap: 8 (ref 5–15)
BUN: 9 mg/dL (ref 8–23)
CO2: 23 mmol/L (ref 22–32)
Calcium: 8.8 mg/dL — ABNORMAL LOW (ref 8.9–10.3)
Chloride: 104 mmol/L (ref 98–111)
Creatinine, Ser: 0.75 mg/dL (ref 0.44–1.00)
GFR, Estimated: >60 mL/min (ref >60)
Glucose, Bld: 116 mg/dL — ABNORMAL HIGH (ref 70–99)
Potassium: 4.2 mmol/L (ref 3.5–5.1)
Sodium: 135 mmol/L (ref 135–145)
Total Bilirubin: 0.4 mg/dL (ref 0.3–1.2)
Total Protein: 6.4 g/dL — ABNORMAL LOW (ref 6.5–8.1)

## 2020-05-01 MED ORDER — HEPARIN SOD (PORK) LOCK FLUSH 100 UNIT/ML IV SOLN
250.0000 [IU] | Freq: Once | INTRAVENOUS | Status: AC
Start: 2020-05-01 — End: 2020-05-01
  Administered 2020-05-01: 10:00:00 500 [IU]
  Filled 2020-05-01: qty 5

## 2020-05-01 MED ORDER — SODIUM CHLORIDE 0.9% FLUSH
10.0000 mL | Freq: Once | INTRAVENOUS | Status: AC
  Administered 2020-05-01: 10:00:00 10 mL
  Filled 2020-05-01: qty 10

## 2020-05-01 NOTE — Patient Instructions (Signed)

## 2020-05-01 NOTE — Telephone Encounter (Signed)
Scheduled appts per 12/21 los. Gave pt a print out of AVS.  

## 2020-05-06 NOTE — Progress Notes (Signed)
[place]  [phone]) [phone] Fax:(336) [phone]     ID: [name] [name] DOB: [birth year]  MR#: [id number]  NVB#:[id number]  Patient Care Team: Kristie Cowman, MD as PCP - General (Family Medicine) Mauro Kaufmann, RN as Oncology Nurse Navigator Rockwell Germany, RN as Oncology Nurse Navigator Magrinat, Virgie Dad, MD as Consulting Physician (Oncology) Erroll Luna, MD as Consulting Physician (General Surgery) Eppie Gibson, MD as Attending Physician (Radiation Oncology) Scot Dock, NP OTHER MD:  CHIEF COMPLAINT: Estrogen receptor positive breast cancer  CURRENT TREATMENT: Neoadjuvant chemotherapy   INTERVAL HISTORY: [name] [name] returns today for follow up and treatment of her estrogen receptor positive breast cancer.   We are switching her chemotherapy to cyclophosphamide and doxorubicin because of her problems with neuropathy, and we will do 2-4 cycles of those agents, every 2 or 3 weeks depending on tolerance and evidence of response.    Today she is here for cycle 2 day 1 of treatment with Cyclophosphamdie and Doxorubicin.    Her most recent echocardiogram on 04/17/2020 showed an ejection fraction of 55-60%.   REVIEW OF SYSTEMS: [name] [name] feels tired.  She says she slept on Christmas.  She has a decreased appetite.  She says she doesn't feel hungry.  She notes her peripheral neruopathy is improving.  She denies any easy bruising or bleeding.  She is weak and notes she has had decreased oral intake.  She denies dizziness.  She says she cannot locate her breast mass.  A detailed ROS was otherwise non contributory.     COVID 35 VACCINATION STATUS: s/p    X2, most recently June 2021, no booster as of 05/01/2020   HISTORY OF CURRENT ILLNESS: From the original intake note:  [name] [name] herself palpated a mass in the right axilla "a long time ago", more recently with complaints of associated pain. She brought it to medical attention and underwent bilateral  diagnostic mammography with tomography and bilateral breast ultrasonography at [facility] on 01/31/2020 showing: breast density category B; three right breast masses-- 1.9 cm at 12 o'clock, 2.3 cm at 11 o'clock, 0.4 cm at 1 o'clock; two adjacent enlarged thickened lymph nodes measuring up to 2.1 cm; no evidence of left breast malignancy.  Accordingly on 02/03/2020 she proceeded to biopsy of the right breast area at 12 o'clock and right axilla. The pathology from this procedure ([id number]) showed: invasive ductal carcinoma, grade 3. Prognostic indicators significant for: estrogen receptor, 100% positive with strong staining intensity and progesterone receptor, 0% negative. Proliferation marker Ki67 at 60%. HER2 equivocal by immunohistochemistry (2+), but negative by fluorescent in situ hybridization with a signals ratio 0.81 and number per cell 2.7.  The biopsied lymph node was found to show metastatic carcinoma. The morphology was considered different from the biopsied mass, and a prognostic panel was performed. Estrogen receptor, 100% positive with strong staining intensity and progesterone receptor, 0% negative. Proliferation marker Ki67 at 70%. HER2 equivcoal by immunohistochemistry (2+), but negative) by fluorescent in situ hybridization with a signals ratio 1.16 and number per cell 3.9.  She underwent additional right breast biopsies on 02/06/2020. Pathology [id number]) from the mass at 11 o'clock showed: invasive ductal carcinoma, grade 2. Prognostic indicators significant for: estrogen receptor, 100% positive with strong staining intensity and progesterone receptor, 0% negative. Proliferation marker Ki67 at 15%. HER2 equivocal by immunohistochemistry (2+), but negative by fluorescent in situ hybridization with a signals ratio 1.29 and number per cell 2.7.  The biopsied mass at 1 o'clock showed only fibrocystic  change.  The patient's subsequent history is as detailed below.   PAST MEDICAL  HISTORY: Past Medical History:  Diagnosis Date  . Arthritis   . Diabetes mellitus without complication ([place])   . Hypertension     PAST SURGICAL HISTORY: Past Surgical History:  Procedure Laterality Date  . IR IMAGING GUIDED PORT INSERTION  02/23/2020    FAMILY HISTORY: Family History  Problem Relation Age of Onset  . Asthma Mother   . Arthritis Sister   . Asthma Brother   . Arthritis Brother   [name] died in his 81s from causes unknown to the patient.  [name] died from heart disease at age 23.  The patient had 4 brothers, 1 sister, with no history of cancer in the family to her knowledge   GYNECOLOGIC HISTORY:  No LMP recorded. Patient is postmenopausal. Menarche: 65 years old Age at first live birth: 65 years old [place] P 1 LMP 71s Contraceptive HRT no  Hysterectomy? no BSO? no   SOCIAL HISTORY: (updated 02/2020)  [name] [name] is originally from [place].  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  She describes herself as single.  At home she lives with her daughter [name] [name] who works for [employer], her daughters husband [name] who is a English as a second language teacher, and their children who are 78 months old and 69 years old.  The patient is in [place]: Not in place.  The patient tells me she would intend to name her daughter [name] [name] as her healthcare power of attorney   HEALTH MAINTENANCE: Social History   Tobacco Use  . Smoking status: Never Smoker  . Smokeless tobacco: Never Used  Vaping Use  . Vaping Use: Never used  Substance Use Topics  . Alcohol use: Yes    Alcohol/week: 1.0 standard drink    Types: 1 Glasses of wine per week    Comment: occasionally  . Drug use: Never     Colonoscopy: never done  PAP: 2017 (performed in [place])  Bone density:    Allergies  Allergen Reactions  . Sulfa Antibiotics Itching    Current Outpatient Medications  Medication Sig Dispense Refill  . acetaminophen  (TYLENOL) 500 MG tablet Take 500 mg by mouth 3 (three) times daily as needed.    Marland Kitchen amLODipine (NORVASC) 5 MG tablet Take 5 mg by mouth daily.    Marland Kitchen atorvastatin (LIPITOR) 40 MG tablet Take 40 mg by mouth daily.    . carvedilol (COREG) 6.25 MG tablet Take 6.25 mg by mouth 2 (two) times daily with a meal.    . celecoxib (CELEBREX) 200 MG capsule Take 200 mg by mouth daily.     Marland Kitchen dexamethasone (DECADRON) 4 MG tablet Take 2 tablets BID the day before chemotherapy, then 2 tablets BID for three days after chemotherapy.  Then take 1 tablet daily x 5 days, then stop. 30 tablet 0  . dexamethasone (DECADRON) 4 MG tablet Take 2 tablets (8 mg total) by mouth daily. Take daily for 3 days after chemo. Take with food. 30 tablet 1  . fluconazole (DIFLUCAN) 200 MG tablet Take 1 tablet (200 mg total) by mouth daily. (Patient not taking: Reported on 03/26/2020) 30 tablet 0  . lansoprazole (PREVACID) 30 MG capsule Take 30 mg by mouth every morning.    Marland Kitchen losartan (COZAAR) 100 MG tablet Take 100 mg by mouth daily.    . metFORMIN (GLUCOPHAGE) 1000 MG tablet Take 1,000 mg  by mouth daily with breakfast.     . prochlorperazine (COMPAZINE) 10 MG tablet Take 1 tablet (10 mg total) by mouth every 6 (six) hours as needed (Nausea or vomiting). 30 tablet 1  . Rivaroxaban (XARELTO) 15 MG TABS tablet Take 1 tablet (15 mg total) by mouth 2 (two) times daily with a meal. 42 tablet 0   Current Facility-Administered Medications  Medication Dose Route Frequency Provider Last Rate Last Admin  . 0.9 %  sodium chloride infusion   Intravenous Once Elior Robinette, Charlestine Massed, NP        OBJECTIVE: African-American woman who appears older than stated age  34:   05/07/20 0906  BP: (!) 91/54  Pulse: 83  Resp: 18  Temp: 97.9 F (36.6 C)  SpO2: 100%     Body mass index is 34.19 kg/m.   Wt Readings from Last 3 Encounters:  05/07/20 211 lb 12.8 oz (96.1 kg)  05/01/20 213 lb 14.4 oz (97 kg)  04/24/20 220 lb (99.8 kg)      ECOG  FS:2 - Symptomatic, <50% confined to bed GENERAL: Patient is a well appearing female in no acute distress HEENT:  Sclerae anicteric.  Dry MM. Neck is supple.  NODES:  No cervical, supraclavicular, or axillary lymphadenopathy palpated.  BREAST EXAM:  Unable to palpate right breast mass LUNGS:  Clear to auscultation bilaterally.  No wheezes or rhonchi. HEART:  Regular rate and rhythm. No murmur appreciated. ABDOMEN:  Soft, nontender.  Positive, normoactive bowel sounds. No organomegaly palpated. MSK:  No focal spinal tenderness to palpation. Full range of motion bilaterally in the upper extremities. EXTREMITIES:  No peripheral edema.   SKIN:  Clear with no obvious rashes or skin changes. No nail dyscrasia. NEURO:  Nonfocal. Well oriented.  Appropriate affect.     LAB RESULTS:  CMP     Component Value Date/Time   NA 130 (L) 05/07/2020 0841   K 4.0 05/07/2020 0841   CL 99 05/07/2020 0841   CO2 18 (L) 05/07/2020 0841   GLUCOSE 194 (H) 05/07/2020 0841   BUN 24 (H) 05/07/2020 0841   CREATININE 1.81 (H) 05/07/2020 0841   CREATININE 0.76 04/02/2020 0900   CALCIUM 8.9 05/07/2020 0841   PROT 6.6 05/07/2020 0841   ALBUMIN 3.7 05/07/2020 0841   AST 63 (H) 05/07/2020 0841   AST 10 (L) 04/02/2020 0900   ALT 36 05/07/2020 0841   ALT 14 04/02/2020 0900   ALKPHOS 98 05/07/2020 0841   BILITOT 0.3 05/07/2020 0841   BILITOT 0.6 04/02/2020 0900   GFRNONAA 31 (L) 05/07/2020 0841   GFRNONAA >60 04/02/2020 0900    No results found for: TOTALPROTELP, ALBUMINELP, A1GS, A2GS, BETS, BETA2SER, GAMS, MSPIKE, SPEI  Lab Results  Component Value Date   WBC 4.0 05/07/2020   NEUTROABS 2.5 05/07/2020   HGB 11.3 (L) 05/07/2020   HCT 34.2 (L) 05/07/2020   MCV 87.5 05/07/2020   PLT 191 05/07/2020    No results found for: LABCA2  No components found for: [id number]  No results for input(s): INR in the last 168 hours.  No results found for: LABCA2  No results found for: TFT732  No results found  for: KGU542  No results found for: HCW237  No results found for: CA2729  No components found for: HGQUANT  No results found for: CEA1 / No results found for: CEA1   No results found for: AFPTUMOR  No results found for: CHROMOGRNA  No results found for: KPAFRELGTCHN, LAMBDASER, KAPLAMBRATIO (kappa/lambda light  chains)  No results found for: HGBA, HGBA2QUANT, HGBFQUANT, HGBSQUAN (Hemoglobinopathy evaluation)   No results found for: LDH  No results found for: IRON, TIBC, IRONPCTSAT (Iron and TIBC)  No results found for: FERRITIN  Urinalysis No results found for: COLORURINE, APPEARANCEUR, LABSPEC, PHURINE, GLUCOSEU, HGBUR, BILIRUBINUR, KETONESUR, PROTEINUR, UROBILINOGEN, NITRITE, LEUKOCYTESUR   STUDIES: CT Chest W Contrast  Result Date: 04/24/2020 CLINICAL DATA:  Left upper extremity swelling. Estrogen receptor positive breast cancer on neoadjuvant chemotherapy. EXAM: CT CHEST WITH CONTRAST TECHNIQUE: Multidetector CT imaging of the chest was performed during intravenous contrast administration. CONTRAST:  56m OMNIPAQUE IOHEXOL 300 MG/ML  SOLN COMPARISON:  None. FINDINGS: Cardiovascular: Normal heart size. No significant pericardial effusion/thickening. Left internal jugular Port-A-Cath terminates in the lower third of the SVC. Suggestion of a small nonocclusive catheter associated thrombus in the left brachiocephalic vein (series 2/image 31). Atherosclerotic nonaneurysmal thoracic aorta. Top-normal caliber main pulmonary artery (3.3 cm diameter). No central pulmonary emboli. Mediastinum/Nodes: No discrete thyroid nodules. Unremarkable esophagus. Multiple mildly enlarged right axillary lymph nodes, largest with short axis diameter 1.4 cm (series 2/image 36). No left axillary adenopathy. Lungs/Pleura: No pneumothorax. No pleural effusion. No acute consolidative airspace disease or lung masses. Tiny 2 mm posterior right upper lobe pulmonary nodule (series 5/image 27). No additional  significant pulmonary nodules. Upper abdomen: Small hiatal hernia. Musculoskeletal: No aggressive appearing focal osseous lesions. Clustered right breast 1.5 cm and 1.4 cm soft tissue masses (series 2/images 60 and 58, respectively). Moderate lower thoracic spondylosis. IMPRESSION: 1. Suggestion of a small nonocclusive catheter associated thrombus in the left brachiocephalic vein. Left internal jugular Port-A-Cath is well positioned with tip in the lower third of the SVC. 2. Clustered right breast soft tissue masses, presumably representing known primary breast malignancy. 3. Multiple mildly enlarged right axillary lymph nodes, compatible with metastatic disease. 4. Tiny 2 mm posterior right upper lobe pulmonary nodule, for which 3 month chest CT follow-up is recommended. 5. Small hiatal hernia. 6. Aortic Atherosclerosis (ICD10-I70.0). These results will be called to the ordering clinician or representative by the Radiologist Assistant, and communication documented in the PACS or [employer] Electronically Signed   By: JIlona SorrelM.D.   On: 04/24/2020 09:10   ECHOCARDIOGRAM COMPLETE  Result Date: 04/17/2020    ECHOCARDIOGRAM REPORT   Patient Name:   [facility] Date of Exam: 04/17/2020 Medical Rec #:  [id number]  Height:       66.0 in Accession #:    [id number] Weight:       221.8 lb Date of Birth:  1Jan 16, 1957 BSA:          2.090 m Patient Age:    89years    BP:           97/59 mmHg Patient Gender: F           HR:           69 bpm. Exam Location:  Outpatient Procedure: 2D Echo, 3D Echo, Cardiac Doppler, Color Doppler and Strain Analysis Indications:    Z51.11 Encounter for antineoplastic chemotheraphy  History:        Patient has no prior history of Echocardiogram examinations.                 Risk Factors:Hypertension and Diabetes.  Sonographer:    TJonelle SidleDance Referring Phys: 8[place] 1. Left ventricular ejection fraction, by estimation, is 55 to 60%. The left ventricle  has normal function. The left ventricle has  no regional wall motion abnormalities. There is mild concentric left ventricular hypertrophy. Left ventricular diastolic parameters are consistent with Grade I diastolic dysfunction (impaired relaxation). The average left ventricular global longitudinal strain is -16.5 %. The global longitudinal strain is abnormal.  2. Right ventricular systolic function is normal. The right ventricular size is normal. There is normal pulmonary artery systolic pressure.  3. Left atrial size was mildly dilated.  4. The mitral valve is normal in structure. Trivial mitral valve regurgitation. No evidence of mitral stenosis.  5. The aortic valve is normal in structure. Aortic valve regurgitation is not visualized. No aortic stenosis is present.  6. There is borderline dilatation of the ascending aorta, measuring 40 mm.  7. The inferior vena cava is normal in size with greater than 50% respiratory variability, suggesting right atrial pressure of 3 mmHg. Comparison(s): No prior Echocardiogram. FINDINGS  Left Ventricle: Left ventricular ejection fraction, by estimation, is 55 to 60%. The left ventricle has normal function. The left ventricle has no regional wall motion abnormalities. The average left ventricular global longitudinal strain is -16.5 %. The global longitudinal strain is abnormal. The left ventricular internal cavity size was normal in size. There is mild concentric left ventricular hypertrophy. Left ventricular diastolic parameters are consistent with Grade I diastolic dysfunction (impaired relaxation). Normal left ventricular filling pressure. Right Ventricle: The right ventricular size is normal. No increase in right ventricular wall thickness. Right ventricular systolic function is normal. There is normal pulmonary artery systolic pressure. The tricuspid regurgitant velocity is 2.17 m/s, and  with an assumed right atrial pressure of 3 mmHg, the estimated right ventricular  systolic pressure is 19.3 mmHg. Left Atrium: Left atrial size was mildly dilated. Right Atrium: Right atrial size was normal in size. Pericardium: There is no evidence of pericardial effusion. Mitral Valve: The mitral valve is normal in structure. Trivial mitral valve regurgitation. No evidence of mitral valve stenosis. Tricuspid Valve: The tricuspid valve is normal in structure. Tricuspid valve regurgitation is mild . No evidence of tricuspid stenosis. Aortic Valve: The aortic valve is normal in structure. Aortic valve regurgitation is not visualized. No aortic stenosis is present. Pulmonic Valve: The pulmonic valve was normal in structure. Pulmonic valve regurgitation is not visualized. No evidence of pulmonic stenosis. Aorta: The aortic root is normal in size and structure. There is borderline dilatation of the ascending aorta, measuring 40 mm. Venous: The inferior vena cava is normal in size with greater than 50% respiratory variability, suggesting right atrial pressure of 3 mmHg. IAS/Shunts: No atrial level shunt detected by color flow Doppler.  LEFT VENTRICLE PLAX 2D LVIDd:         4.00 cm  Diastology LVIDs:         2.60 cm  LV e' medial:    7.40 cm/s LV PW:         1.40 cm  LV E/e' medial:  10.1 LV IVS:        1.00 cm  LV e' lateral:   9.57 cm/s LVOT diam:     2.00 cm  LV E/e' lateral: 7.8 LV SV:         76 LV SV Index:   36       2D Longitudinal Strain LVOT Area:     3.14 cm 2D Strain GLS Avg:     -16.5 %                          3D Volume  EF:                         3D EF:        58 %                         LV EDV:       142 ml                         LV ESV:       59 ml                         LV SV:        83 ml RIGHT VENTRICLE             IVC RV Basal diam:  2.90 cm     IVC diam: 1.50 cm RV S prime:     12.70 cm/s TAPSE (M-mode): 2.1 cm LEFT ATRIUM             Index       RIGHT ATRIUM           Index LA diam:        4.30 cm 2.06 cm/m  RA Area:     14.60 cm LA Vol (A2C):   69.3 ml 33.16 ml/m RA  Volume:   33.40 ml  15.98 ml/m LA Vol (A4C):   49.6 ml 23.74 ml/m LA Biplane Vol: 63.3 ml 30.29 ml/m  AORTIC VALVE LVOT Vmax:   132.00 cm/s LVOT Vmean:  84.300 cm/s LVOT VTI:    0.242 m  AORTA Ao Root diam: 3.10 cm Ao Asc diam:  4.00 cm MITRAL VALVE               TRICUSPID VALVE MV Area (PHT): 2.62 cm    TR Peak grad:   18.8 mmHg MV Decel Time: 289 msec    TR Vmax:        217.00 cm/s MV E velocity: 74.70 cm/s MV A velocity: 92.40 cm/s  SHUNTS MV E/A ratio:  0.81        Systemic VTI:  0.24 m                            Systemic Diam: 2.00 cm Ena Dawley MD Electronically signed by Ena Dawley MD Signature Date/Time: 04/17/2020/12:05:08 PM    Final      ELIGIBLE FOR AVAILABLE RESEARCH PROTOCOL: AET  ASSESSMENT: 64 y.o. [place] woman status post right breast upper outer quadrant (12:00) and axillary lymph node biopsy 02/03/2020, both positive for a clinical T1c N1, stage IIB invasive ductal carcinoma, grade 3, estrogen receptor positive, HER-2 and progesterone receptor negative, with an MIB-1 of 60%.  (a) biopsy of a second right upper outer quadrant T2 lesion (11 o'clock) 02/06/2020 also showed invasive ductal carcinoma, but grade 2, estrogen receptor positive, progesterone receptor and HER-2 negative, with an MIB-1 of 15%  (b) biopsy of a third right breast lesion (1:00) was benign   (1) neoadjuvant chemotherapy consisting of cyclophosphamide and docetaxel every 21 days x 4 starting 03/05/2020  (a) discontinued after 2 cycles because of peripheral neuropathy  (b) cyclophosphamide/doxorubicin started 04/24/2020, to be repeated x4  (c) echo 04/17/2020 shows EF of 55-60%  (2) definitive surgery to follow  (3) adjuvant radiation  (4) antiestrogens to follow  (  5) CT scan of the chest 04/23/2020 shows a left brachiocephalicl catheter associated clot  (a) rivaroxaban started 04/24/2020   PLAN: [name] [name] is unfortunately dehydrated and weak following her chemotherapy on 04/24/2020.  Her  creatinine is 1.87.  She will need IV fluids today and to have an addition week off of chemotherapy.  I cannot palpate her breast mass which indicates a continued response to chemotherapy.    She will return on Friday, 05/11/2020 for IV fluids as well.    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  Allyssia and I talked about her overall treatment goals, and surgery considerations.  Lana understands the above and will return as recommended.  She knows to call for any questions that may arise between now and her next appointment.  We are happy to see her sooner if needed.     Total encounter time 30 minutes.Wilber Bihari, NP 05/07/20 9:48 AM Medical Oncology and Hematology [facility] [place], [place] [address] Tel. [phone]    Fax. [phone]    *Total Encounter Time as defined by the Centers for Medicare and Medicaid Services includes, in addition to the face-to-face time of a patient visit (documented in the note above) non-face-to-face time: obtaining and reviewing outside history, ordering and reviewing medications, tests or procedures, care coordination (communications with other health care professionals or caregivers) and documentation in the medical record.

## 2020-05-07 ENCOUNTER — Inpatient Hospital Stay: Payer: Self-pay

## 2020-05-07 ENCOUNTER — Other Ambulatory Visit: Payer: Self-pay

## 2020-05-07 ENCOUNTER — Ambulatory Visit: Payer: No Typology Code available for payment source | Admitting: Nurse Practitioner

## 2020-05-07 ENCOUNTER — Encounter: Payer: Self-pay | Admitting: *Deleted

## 2020-05-07 ENCOUNTER — Inpatient Hospital Stay (HOSPITAL_BASED_OUTPATIENT_CLINIC_OR_DEPARTMENT_OTHER): Payer: Self-pay | Admitting: Adult Health

## 2020-05-07 ENCOUNTER — Encounter: Payer: Self-pay | Admitting: Adult Health

## 2020-05-07 ENCOUNTER — Encounter: Payer: Self-pay | Admitting: Oncology

## 2020-05-07 ENCOUNTER — Telehealth: Payer: Self-pay | Admitting: Oncology

## 2020-05-07 VITALS — BP 91/54 | HR 83 | Temp 97.9°F | Resp 18 | Ht 66.0 in | Wt 211.8 lb

## 2020-05-07 VITALS — BP 105/72 | HR 73 | Resp 16

## 2020-05-07 DIAGNOSIS — Z95828 Presence of other vascular implants and grafts: Secondary | ICD-10-CM

## 2020-05-07 DIAGNOSIS — C50411 Malignant neoplasm of upper-outer quadrant of right female breast: Secondary | ICD-10-CM

## 2020-05-07 DIAGNOSIS — Z170 Estrogen receptor positive status [ER+]: Secondary | ICD-10-CM

## 2020-05-07 LAB — CBC WITH DIFFERENTIAL/PLATELET
Abs Immature Granulocytes: 0.44 10*3/uL — ABNORMAL HIGH (ref 0.00–0.07)
Basophils Absolute: 0 10*3/uL (ref 0.0–0.1)
Basophils Relative: 1 %
Eosinophils Absolute: 0 10*3/uL (ref 0.0–0.5)
Eosinophils Relative: 0 %
HCT: 34.2 % — ABNORMAL LOW (ref 36.0–46.0)
Hemoglobin: 11.3 g/dL — ABNORMAL LOW (ref 12.0–15.0)
Immature Granulocytes: 11 %
Lymphocytes Relative: 16 %
Lymphs Abs: 0.6 10*3/uL — ABNORMAL LOW (ref 0.7–4.0)
MCH: 28.9 pg (ref 26.0–34.0)
MCHC: 33 g/dL (ref 30.0–36.0)
MCV: 87.5 fL (ref 80.0–100.0)
Monocytes Absolute: 0.4 10*3/uL (ref 0.1–1.0)
Monocytes Relative: 11 %
Neutro Abs: 2.5 10*3/uL (ref 1.7–7.7)
Neutrophils Relative %: 61 %
Platelets: 191 10*3/uL (ref 150–400)
RBC: 3.91 MIL/uL (ref 3.87–5.11)
RDW: 15.9 % — ABNORMAL HIGH (ref 11.5–15.5)
WBC: 4 10*3/uL (ref 4.0–10.5)
nRBC: 0 % (ref 0.0–0.2)

## 2020-05-07 LAB — COMPREHENSIVE METABOLIC PANEL
ALT: 36 U/L (ref 0–44)
AST: 63 U/L — ABNORMAL HIGH (ref 15–41)
Albumin: 3.7 g/dL (ref 3.5–5.0)
Alkaline Phosphatase: 98 U/L (ref 38–126)
Anion gap: 13 (ref 5–15)
BUN: 24 mg/dL — ABNORMAL HIGH (ref 8–23)
CO2: 18 mmol/L — ABNORMAL LOW (ref 22–32)
Calcium: 8.9 mg/dL (ref 8.9–10.3)
Chloride: 99 mmol/L (ref 98–111)
Creatinine, Ser: 1.81 mg/dL — ABNORMAL HIGH (ref 0.44–1.00)
GFR, Estimated: 31 mL/min — ABNORMAL LOW (ref >60)
Glucose, Bld: 194 mg/dL — ABNORMAL HIGH (ref 70–99)
Potassium: 4 mmol/L (ref 3.5–5.1)
Sodium: 130 mmol/L — ABNORMAL LOW (ref 135–145)
Total Bilirubin: 0.3 mg/dL (ref 0.3–1.2)
Total Protein: 6.6 g/dL (ref 6.5–8.1)

## 2020-05-07 MED ORDER — SODIUM CHLORIDE 0.9% FLUSH
10.0000 mL | Freq: Once | INTRAVENOUS | Status: AC
  Administered 2020-05-07: 10 mL
  Filled 2020-05-07: qty 10

## 2020-05-07 MED ORDER — HEPARIN SOD (PORK) LOCK FLUSH 100 UNIT/ML IV SOLN
500.0000 [IU] | Freq: Once | INTRAVENOUS | Status: AC
  Administered 2020-05-07: 500 [IU]
  Filled 2020-05-07: qty 5

## 2020-05-07 MED ORDER — SODIUM CHLORIDE 0.9 % IV SOLN
Freq: Once | INTRAVENOUS | Status: AC
  Filled 2020-05-07: qty 250

## 2020-05-07 MED ORDER — SODIUM CHLORIDE 0.9% FLUSH
10.0000 mL | Freq: Once | INTRAVENOUS | Status: AC
  Administered 2020-05-07: 09:00:00 10 mL
  Filled 2020-05-07: qty 10

## 2020-05-07 NOTE — Telephone Encounter (Signed)
Rescheduled appointment per 12/27 schedule message. Patient's daughter is aware of changes.

## 2020-05-07 NOTE — Patient Instructions (Signed)

## 2020-05-07 NOTE — Progress Notes (Signed)
Received document from patient daughter to be submitted with CAFA application.  Placed application in interoffice mail to be submitted to [employer]: Customer Service to be processed.  They will contact patient directly with determination or should any additional information be needed.

## 2020-05-09 ENCOUNTER — Inpatient Hospital Stay: Payer: Self-pay

## 2020-05-11 ENCOUNTER — Other Ambulatory Visit: Payer: Self-pay | Admitting: Adult Health

## 2020-05-11 ENCOUNTER — Other Ambulatory Visit: Payer: Self-pay

## 2020-05-11 ENCOUNTER — Inpatient Hospital Stay: Payer: Self-pay

## 2020-05-11 VITALS — BP 94/51 | HR 71 | Temp 97.0°F | Resp 18

## 2020-05-11 DIAGNOSIS — C50411 Malignant neoplasm of upper-outer quadrant of right female breast: Secondary | ICD-10-CM

## 2020-05-11 DIAGNOSIS — Z95828 Presence of other vascular implants and grafts: Secondary | ICD-10-CM

## 2020-05-11 DIAGNOSIS — Z170 Estrogen receptor positive status [ER+]: Secondary | ICD-10-CM

## 2020-05-11 LAB — BASIC METABOLIC PANEL
Anion gap: 9 (ref 5–15)
BUN: 11 mg/dL (ref 8–23)
CO2: 18 mmol/L — ABNORMAL LOW (ref 22–32)
Calcium: 8.1 mg/dL — ABNORMAL LOW (ref 8.9–10.3)
Chloride: 100 mmol/L (ref 98–111)
Creatinine, Ser: 1.16 mg/dL — ABNORMAL HIGH (ref 0.44–1.00)
GFR, Estimated: 53 mL/min — ABNORMAL LOW (ref >60)
Glucose, Bld: 123 mg/dL — ABNORMAL HIGH (ref 70–99)
Potassium: 4.6 mmol/L (ref 3.5–5.1)
Sodium: 127 mmol/L — ABNORMAL LOW (ref 135–145)

## 2020-05-11 MED ORDER — HEPARIN SOD (PORK) LOCK FLUSH 100 UNIT/ML IV SOLN
500.0000 [IU] | Freq: Once | INTRAVENOUS | Status: AC
  Administered 2020-05-11: 500 [IU]
  Filled 2020-05-11: qty 5

## 2020-05-11 MED ORDER — SODIUM CHLORIDE 0.9 % IV SOLN
Freq: Once | INTRAVENOUS | Status: DC
  Filled 2020-05-11: qty 250

## 2020-05-11 MED ORDER — SODIUM CHLORIDE 1 G PO TABS: 1.0000 g | ORAL_TABLET | Freq: Three times a day (TID) | ORAL | 0 refills | Status: DC

## 2020-05-11 MED ORDER — SODIUM CHLORIDE 0.9 % IV SOLN
Freq: Once | INTRAVENOUS | Status: AC
  Filled 2020-05-11: qty 250

## 2020-05-11 MED ORDER — SODIUM CHLORIDE 0.9% FLUSH
10.0000 mL | Freq: Once | INTRAVENOUS | Status: DC
  Filled 2020-05-11: qty 10

## 2020-05-11 MED FILL — SODIUM CHLORIDE 1 GM TABLET: 1 | 5 days supply | Qty: 15 | Fill #0

## 2020-05-11 NOTE — Progress Notes (Signed)
Spoke with [name] [name] about her low sodium.  She could not provide a urine sample before she left as [name], NP had ordered.  Let her know that salt tabs were called in for her to the pharmacy and she asked if we could change the pharmacy.  RN called Mardella Layman to have the pharmacy changed.  Patient education on her hyponatremia and after visit summary with patient instructions provided to patient. Lorayne Marek, RN

## 2020-05-11 NOTE — Patient Instructions (Signed)
Sodium Chloride for oral solution What is this medicine? SODIUM CHLORIDE (SOE dee um KLOOR ide) is a natural salt that is important for normal body functions. Too much or too little sodium in the body can cause health problems. This medicine is used to replace sodium in the body after excessive sweating in order to prevent and treat heat cramps. It is also used to replace sodium when replacement is needed for other medical reasons. This medicine may be used for other purposes; ask your health care provider or pharmacist if you have questions. What should I tell my health care provider before I take this medicine? They need to know if you have any of these conditions:  heart disease  high blood pressure  high levels of sodium in the blood  kidney disease  liver disease  low salt or sodium diet  an unusual or allergic reaction to any medicines, foods, dyes, or preservatives  pregnant or trying to get pregnant  breast-feeding How should I use this medicine? Take this medicine by mouth. Follow the directions on the prescription label. The tablet is dissolved in water to make a solution that can be swallowed. If dissolving the tablet, mix with 4 ounces (120 ml) of distilled water and use exactly as directed. Do not take your medicine more often than directed. Talk to your pediatrician regarding the use of this medicine in children. Special care may be needed. Overdosage: If you think you have taken too much of this medicine contact a poison control center or emergency room at once. NOTE: This medicine is only for you. Do not share this medicine with others. What if I miss a dose? This medicine is not for regular use. Take only those doses prescribed by your doctor or health care professional. Do not take double or extra doses. What may interact with this medicine?  certain medicines for high blood pressure  lithium This list may not describe all possible interactions. Give your health  care provider a list of all the medicines, herbs, non-prescription drugs, or dietary supplements you use. Also tell them if you smoke, drink alcohol, or use illegal drugs. Some items may interact with your medicine. What should I watch for while using this medicine? Stop taking this medicine and tell your doctor or healthcare professional if your symptoms of heat cramps continue for more than 24 hours or if they get worse. You may have a serious medical condition. Each tablet contains 394 mg of sodium. Inform your doctor or healthcare professional if you are on a low salt or sodium diet. What side effects may I notice from receiving this medicine? Side effects that you should report to your doctor or health care professional as soon as possible:  allergic reactions like skin rash, itching or hives, swelling of the face, lips, or tongue  confusion  muscle twitching  seizures  swelling of the ankles, feet, hands Side effects that usually do not require medical attention (report to your doctor or health care professional if they continue or are bothersome):  increased thirst This list may not describe all possible side effects. Call your doctor for medical advice about side effects. You may report side effects to FDA at 1-800-FDA-1088. Where should I keep my medicine? Keep out of the reach of children. Store at room temperature between 15 and 30 degrees C (59 and 86 degrees F). Keep container tightly closed. Throw away any unused medicine after the expiration date. NOTE: This sheet is a summary. It may  not cover all possible information. If you have questions about this medicine, talk to your doctor, pharmacist, or health care provider.  2020 Elsevier/Gold Standard (2015-05-31 12:47:29) Dehydration, Adult Dehydration is condition in which there is not enough water or other fluids in the body. This happens when a person loses more fluids than he or she takes in. Important body parts cannot  work right without the right amount of fluids. Any loss of fluids from the body can cause dehydration. Dehydration can be mild, worse, or very bad. It should be treated right away to keep it from getting very bad. What are the causes? This condition may be caused by:  Conditions that cause loss of water or other fluids, such as: ? Watery poop (diarrhea). ? Vomiting. ? Sweating a lot. ? Peeing (urinating) a lot.  Not drinking enough fluids, especially when you: ? Are ill. ? Are doing things that take a lot of energy to do.  Other illnesses and conditions, such as fever or infection.  Certain medicines, such as medicines that take extra fluid out of the body (diuretics).  Lack of safe drinking water.  Not being able to get enough water and food. What increases the risk? The following factors may make you more likely to develop this condition:  Having a long-term (chronic) illness that has not been treated the right way, such as: ? Diabetes. ? Heart disease. ? Kidney disease.  Being 20 years of age or older.  Having a disability.  Living in a place that is high above the ground or sea (high in altitude). The thinner, dried air causes more fluid loss.  Doing exercises that put stress on your body for a long time. What are the signs or symptoms? Symptoms of dehydration depend on how bad it is. Mild or worse dehydration  Thirst.  Dry lips or dry mouth.  Feeling dizzy or light-headed, especially when you stand up from sitting.  Muscle cramps.  Your body making: ? Dark pee (urine). Pee may be the color of tea. ? Less pee than normal. ? Less tears than normal.  Headache. Very bad dehydration  Changes in skin. Skin may: ? Be cold to the touch (clammy). ? Be blotchy or pale. ? Not go back to normal right after you lightly pinch it and let it go.  Little or no tears, pee, or sweat.  Changes in vital signs, such as: ? Fast breathing. ? Low blood pressure. ? Weak  pulse. ? Pulse that is more than 100 beats a minute when you are sitting still.  Other changes, such as: ? Feeling very thirsty. ? Eyes that look hollow (sunken). ? Cold hands and feet. ? Being mixed up (confused). ? Being very tired (lethargic) or having trouble waking from sleep. ? Short-term weight loss. ? Loss of consciousness. How is this treated? Treatment for this condition depends on how bad it is. Treatment should start right away. Do not wait until your condition gets very bad. Very bad dehydration is an emergency. You will need to go to a hospital.  Mild or worse dehydration can be treated at home. You may be asked to: ? Drink more fluids. ? Drink an oral rehydration solution (ORS). This drink helps get the right amounts of fluids and salts and minerals in the blood (electrolytes).  Very bad dehydration can be treated: ? With fluids through an IV tube. ? By getting normal levels of salts and minerals in your blood. This is often done by  giving salts and minerals through a tube. The tube is passed through your nose and into your stomach. ? By treating the root cause. Follow these instructions at home: Oral rehydration solution If told by your doctor, drink an ORS:  Make an ORS. Use instructions on the package.  Start by drinking small amounts, about  cup (120 mL) every 5-10 minutes.  Slowly drink more until you have had the amount that your doctor said to have. Eating and drinking         Drink enough clear fluid to keep your pee pale yellow. If you were told to drink an ORS, finish the ORS first. Then, start slowly drinking other clear fluids. Drink fluids such as: ? Water. Do not drink only water. Doing that can make the salt (sodium) level in your body get too low. ? Water from ice chips you suck on. ? Fruit juice that you have added water to (diluted). ? Low-calorie sports drinks.  Eat foods that have the right amounts of salts and minerals, such  as: ? Bananas. ? Oranges. ? Potatoes. ? Tomatoes. ? Spinach.  Do not drink alcohol.  Avoid: ? Drinks that have a lot of sugar. These include:  High-calorie sports drinks.  Fruit juice that you did not add water to.  Soda.  Caffeine. ? Foods that are greasy or have a lot of fat or sugar. General instructions  Take over-the-counter and prescription medicines only as told by your doctor.  Do not take salt tablets. Doing that can make the salt level in your body get too high.  Return to your normal activities as told by your doctor. Ask your doctor what activities are safe for you.  Keep all follow-up visits as told by your doctor. This is important. Contact a doctor if:  You have pain in your belly (abdomen) and the pain: ? Gets worse. ? Stays in one place.  You have a rash.  You have a stiff neck.  You get angry or annoyed (irritable) more easily than normal.  You are more tired or have a harder time waking than normal.  You feel: ? Weak or dizzy. ? Very thirsty. Get help right away if you have:  Any symptoms of very bad dehydration.  Symptoms of vomiting, such as: ? You cannot eat or drink without vomiting. ? Your vomiting gets worse or does not go away. ? Your vomit has blood or green stuff in it.  Symptoms that get worse with treatment.  A fever.  A very bad headache.  Problems with peeing or pooping (having a bowel movement), such as: ? Watery poop that gets worse or does not go away. ? Blood in your poop (stool). This may cause poop to look black and tarry. ? Not peeing in 6-8 hours. ? Peeing only a small amount of very dark pee in 6-8 hours.  Trouble breathing. These symptoms may be an emergency. Do not wait to see if the symptoms will go away. Get medical help right away. Call your local emergency services (911 in the [place] Do not drive yourself to the hospital. Summary  Dehydration is a condition in which there is not enough water or other  fluids in the body. This happens when a person loses more fluids than he or she takes in.  Treatment for this condition depends on how bad it is. Treatment should be started right away. Do not wait until your condition gets very bad.  Drink enough clear fluid to  keep your pee pale yellow. If you were told to drink an oral rehydration solution (ORS), finish the ORS first. Then, start slowly drinking other clear fluids.  Take over-the-counter and prescription medicines only as told by your doctor.  Get help right away if you have any symptoms of very bad dehydration. This information is not intended to replace advice given to you by your health care provider. Make sure you discuss any questions you have with your health care provider. Document Revised: 12/09/2018 Document Reviewed: 12/09/2018 Elsevier Patient Education  [place].

## 2020-05-11 NOTE — Progress Notes (Signed)
[name] [name] is feeling dehydrated and light headed today.  She was seen by me earlier this week for cycle 2 of Doxorubicin and Cyclophosphamide and was dehydrated.  She did not receive chemotherapy and instead received one liter of IV fluids.  She had AKI, and is here for additional fluids.  She has become more dehydrated and feels slightly lightheaded today.  We will get repeat labs and she will get another liter of IV fluids today.  Will reassess following fluids.    [name], NP

## 2020-05-14 ENCOUNTER — Ambulatory Visit: Payer: No Typology Code available for payment source | Admitting: Oncology

## 2020-05-14 ENCOUNTER — Inpatient Hospital Stay (HOSPITAL_BASED_OUTPATIENT_CLINIC_OR_DEPARTMENT_OTHER): Payer: Self-pay | Admitting: Adult Health

## 2020-05-14 ENCOUNTER — Encounter: Payer: Self-pay | Admitting: Adult Health

## 2020-05-14 ENCOUNTER — Inpatient Hospital Stay: Payer: Self-pay | Attending: Oncology

## 2020-05-14 ENCOUNTER — Other Ambulatory Visit: Payer: No Typology Code available for payment source

## 2020-05-14 ENCOUNTER — Other Ambulatory Visit: Payer: Self-pay

## 2020-05-14 ENCOUNTER — Inpatient Hospital Stay: Payer: Self-pay

## 2020-05-14 ENCOUNTER — Other Ambulatory Visit: Payer: Self-pay | Admitting: Adult Health

## 2020-05-14 ENCOUNTER — Encounter: Payer: Self-pay | Admitting: *Deleted

## 2020-05-14 VITALS — BP 105/64 | HR 73 | Temp 97.2°F | Resp 19 | Ht 66.0 in | Wt 211.4 lb

## 2020-05-14 DIAGNOSIS — Z7984 Long term (current) use of oral hypoglycemic drugs: Secondary | ICD-10-CM | POA: Insufficient documentation

## 2020-05-14 DIAGNOSIS — Z79899 Other long term (current) drug therapy: Secondary | ICD-10-CM | POA: Insufficient documentation

## 2020-05-14 DIAGNOSIS — I10 Essential (primary) hypertension: Secondary | ICD-10-CM | POA: Insufficient documentation

## 2020-05-14 DIAGNOSIS — C50411 Malignant neoplasm of upper-outer quadrant of right female breast: Secondary | ICD-10-CM | POA: Insufficient documentation

## 2020-05-14 DIAGNOSIS — E1142 Type 2 diabetes mellitus with diabetic polyneuropathy: Secondary | ICD-10-CM | POA: Insufficient documentation

## 2020-05-14 DIAGNOSIS — Z170 Estrogen receptor positive status [ER+]: Secondary | ICD-10-CM

## 2020-05-14 DIAGNOSIS — Z5111 Encounter for antineoplastic chemotherapy: Secondary | ICD-10-CM | POA: Insufficient documentation

## 2020-05-14 DIAGNOSIS — M1990 Unspecified osteoarthritis, unspecified site: Secondary | ICD-10-CM | POA: Insufficient documentation

## 2020-05-14 LAB — COMPREHENSIVE METABOLIC PANEL
ALT: 20 U/L (ref 0–44)
AST: 14 U/L — ABNORMAL LOW (ref 15–41)
Albumin: 3 g/dL — ABNORMAL LOW (ref 3.5–5.0)
Alkaline Phosphatase: 108 U/L (ref 38–126)
Anion gap: 8 (ref 5–15)
BUN: 6 mg/dL — ABNORMAL LOW (ref 8–23)
CO2: 21 mmol/L — ABNORMAL LOW (ref 22–32)
Calcium: 9.2 mg/dL (ref 8.9–10.3)
Chloride: 110 mmol/L (ref 98–111)
Creatinine, Ser: 0.81 mg/dL (ref 0.44–1.00)
GFR, Estimated: >60 mL/min (ref >60)
Glucose, Bld: 144 mg/dL — ABNORMAL HIGH (ref 70–99)
Potassium: 4.6 mmol/L (ref 3.5–5.1)
Sodium: 139 mmol/L (ref 135–145)
Total Bilirubin: 0.4 mg/dL (ref 0.3–1.2)
Total Protein: 6.5 g/dL (ref 6.5–8.1)

## 2020-05-14 LAB — CBC WITH DIFFERENTIAL/PLATELET
Abs Immature Granulocytes: 0.17 10*3/uL — ABNORMAL HIGH (ref 0.00–0.07)
Basophils Absolute: 0 10*3/uL (ref 0.0–0.1)
Basophils Relative: 0 %
Eosinophils Absolute: 0 10*3/uL (ref 0.0–0.5)
Eosinophils Relative: 0 %
HCT: 29.5 % — ABNORMAL LOW (ref 36.0–46.0)
Hemoglobin: 10 g/dL — ABNORMAL LOW (ref 12.0–15.0)
Immature Granulocytes: 2 %
Lymphocytes Relative: 11 %
Lymphs Abs: 0.8 10*3/uL (ref 0.7–4.0)
MCH: 29.4 pg (ref 26.0–34.0)
MCHC: 33.9 g/dL (ref 30.0–36.0)
MCV: 86.8 fL (ref 80.0–100.0)
Monocytes Absolute: 0.8 10*3/uL (ref 0.1–1.0)
Monocytes Relative: 10 %
Neutro Abs: 5.6 10*3/uL (ref 1.7–7.7)
Neutrophils Relative %: 77 %
Platelets: 537 10*3/uL — ABNORMAL HIGH (ref 150–400)
RBC: 3.4 MIL/uL — ABNORMAL LOW (ref 3.87–5.11)
RDW: 16 % — ABNORMAL HIGH (ref 11.5–15.5)
WBC: 7.3 10*3/uL (ref 4.0–10.5)
nRBC: 0 % (ref 0.0–0.2)

## 2020-05-14 MED ORDER — SODIUM CHLORIDE 0.9 % IV SOLN
600.0000 mg/m2 | Freq: Once | INTRAVENOUS | Status: DC
  Filled 2020-05-14: qty 65

## 2020-05-14 MED ORDER — SODIUM CHLORIDE 0.9% FLUSH
10.0000 mL | INTRAVENOUS | Status: DC | PRN
  Administered 2020-05-14: 10 mL
  Filled 2020-05-14: qty 10

## 2020-05-14 MED ORDER — DEXAMETHASONE SODIUM PHOSPHATE 100 MG/10ML IJ SOLN
10.0000 mg | Freq: Once | INTRAMUSCULAR | Status: AC
  Administered 2020-05-14: 10 mg via INTRAVENOUS
  Filled 2020-05-14: qty 1
  Filled 2020-05-14: qty 10

## 2020-05-14 MED ORDER — HEPARIN SOD (PORK) LOCK FLUSH 100 UNIT/ML IV SOLN
500.0000 [IU] | Freq: Once | INTRAVENOUS | Status: AC | PRN
  Administered 2020-05-14: 500 [IU]
  Filled 2020-05-14: qty 5

## 2020-05-14 MED ORDER — DOXORUBICIN HCL CHEMO IV INJECTION 2 MG/ML
60.0000 mg/m2 | Freq: Once | INTRAVENOUS | Status: DC
Start: 2020-05-14 — End: 2020-05-14
  Filled 2020-05-14: qty 65

## 2020-05-14 MED ORDER — PALONOSETRON HCL INJECTION 0.25 MG/5ML
INTRAVENOUS | Status: AC
  Filled 2020-05-14: qty 5

## 2020-05-14 MED ORDER — RIVAROXABAN 20 MG PO TABS: 20.0000 mg | ORAL_TABLET | Freq: Every day | ORAL | 2 refills | Status: DC

## 2020-05-14 MED ORDER — SODIUM CHLORIDE 0.9 % IV SOLN
500.0000 mg/m2 | Freq: Once | INTRAVENOUS | Status: AC
  Administered 2020-05-14: 1080 mg via INTRAVENOUS
  Filled 2020-05-14: qty 54

## 2020-05-14 MED ORDER — PALONOSETRON HCL INJECTION 0.25 MG/5ML
0.2500 mg | Freq: Once | INTRAVENOUS | Status: AC
  Administered 2020-05-14: 0.25 mg via INTRAVENOUS

## 2020-05-14 MED ORDER — SODIUM CHLORIDE 0.9 % IV SOLN
Freq: Once | INTRAVENOUS | Status: AC
  Filled 2020-05-14: qty 250

## 2020-05-14 MED ORDER — DOXORUBICIN HCL CHEMO IV INJECTION 2 MG/ML
50.0000 mg/m2 | Freq: Once | INTRAVENOUS | Status: AC
  Administered 2020-05-14: 108 mg via INTRAVENOUS
  Filled 2020-05-14: qty 54

## 2020-05-14 MED ORDER — SODIUM CHLORIDE 0.9 % IV SOLN
150.0000 mg | Freq: Once | INTRAVENOUS | Status: AC
  Administered 2020-05-14: 150 mg via INTRAVENOUS
  Filled 2020-05-14: qty 150
  Filled 2020-05-14: qty 5

## 2020-05-14 MED FILL — XARELTO 20 MG TABLET: 20 | 30 days supply | Qty: 30 | Fill #0

## 2020-05-14 NOTE — Patient Instructions (Signed)
[facility] Discharge Instructions for Patients Receiving Chemotherapy  Today you received the following chemotherapy agents Adriamyicin; cytoxin  To help prevent nausea and vomiting after your treatment, we encourage you to take your nausea medication as directed   If you develop nausea and vomiting that is not controlled by your nausea medication, call the clinic.   BELOW ARE SYMPTOMS THAT SHOULD BE REPORTED IMMEDIATELY:  *FEVER GREATER THAN 100.5 F  *CHILLS WITH OR WITHOUT FEVER  NAUSEA AND VOMITING THAT IS NOT CONTROLLED WITH YOUR NAUSEA MEDICATION  *UNUSUAL SHORTNESS OF BREATH  *UNUSUAL BRUISING OR BLEEDING  TENDERNESS IN MOUTH AND THROAT WITH OR WITHOUT PRESENCE OF ULCERS  *URINARY PROBLEMS  *BOWEL PROBLEMS  UNUSUAL RASH Items with * indicate a potential emergency and should be followed up as soon as possible.  Feel free to call the clinic should you have any questions or concerns. The clinic phone number is [phone].  Please show the CHEMO ALERT CARD at check-in to the Emergency Department and triage nurse.  Doxorubicin injection What is this medicine? DOXORUBICIN (dox oh ROO bi sin) is a chemotherapy drug. It is used to treat many kinds of cancer like leukemia, lymphoma, neuroblastoma, sarcoma, and Wilms' tumor. It is also used to treat bladder cancer, breast cancer, lung cancer, ovarian cancer, stomach cancer, and thyroid cancer. This medicine may be used for other purposes; ask your health care provider or pharmacist if you have questions. COMMON BRAND NAME(S): Adriamycin, Adriamycin PFS, Adriamycin RDF, Rubex What should I tell my health care provider before I take this medicine? They need to know if you have any of these conditions:  heart disease  history of low blood counts caused by a medicine  liver disease  recent or ongoing radiation therapy  an unusual or allergic reaction to doxorubicin, other chemotherapy agents, other  medicines, foods, dyes, or preservatives  pregnant or trying to get pregnant  breast-feeding How should I use this medicine? This drug is given as an infusion into a vein. It is administered in a hospital or clinic by a specially trained health care professional. If you have pain, swelling, burning or any unusual feeling around the site of your injection, tell your health care professional right away. Talk to your pediatrician regarding the use of this medicine in children. Special care may be needed. Overdosage: If you think you have taken too much of this medicine contact a poison control center or emergency room at once. NOTE: This medicine is only for you. Do not share this medicine with others. What if I miss a dose? It is important not to miss your dose. Call your doctor or health care professional if you are unable to keep an appointment. What may interact with this medicine? This medicine may interact with the following medications:  6-mercaptopurine  paclitaxel  phenytoin  St. John's Wort  trastuzumab  verapamil This list may not describe all possible interactions. Give your health care provider a list of all the medicines, herbs, non-prescription drugs, or dietary supplements you use. Also tell them if you smoke, drink alcohol, or use illegal drugs. Some items may interact with your medicine. What should I watch for while using this medicine? This drug may make you feel generally unwell. This is not uncommon, as chemotherapy can affect healthy cells as well as cancer cells. Report any side effects. Continue your course of treatment even though you feel ill unless your doctor tells you to stop. There is a maximum amount of   this medicine you should receive throughout your life. The amount depends on the medical condition being treated and your overall health. Your doctor will watch how much of this medicine you receive in your lifetime. Tell your doctor if you have taken this  medicine before. You may need blood work done while you are taking this medicine. Your urine may turn red for a few days after your dose. This is not blood. If your urine is dark or brown, call your doctor. In some cases, you may be given additional medicines to help with side effects. Follow all directions for their use. Call your doctor or health care professional for advice if you get a fever, chills or sore throat, or other symptoms of a cold or flu. Do not treat yourself. This drug decreases your body's ability to fight infections. Try to avoid being around people who are sick. This medicine may increase your risk to bruise or bleed. Call your doctor or health care professional if you notice any unusual bleeding. Talk to your doctor about your risk of cancer. You may be more at risk for certain types of cancers if you take this medicine. Do not become pregnant while taking this medicine or for 6 months after stopping it. Women should inform their doctor if they wish to become pregnant or think they might be pregnant. Men should not father a child while taking this medicine and for 6 months after stopping it. There is a potential for serious side effects to an unborn child. Talk to your health care professional or pharmacist for more information. Do not breast-feed an infant while taking this medicine. This medicine has caused ovarian failure in some women and reduced sperm counts in some men This medicine may interfere with the ability to have a child. Talk with your doctor or health care professional if you are concerned about your fertility. This medicine may cause a decrease in Co-Enzyme Q-10. You should make sure that you get enough Co-Enzyme Q-10 while you are taking this medicine. Discuss the foods you eat and the vitamins you take with your health care professional. What side effects may I notice from receiving this medicine? Side effects that you should report to your doctor or health care  professional as soon as possible:  allergic reactions like skin rash, itching or hives, swelling of the face, lips, or tongue  breathing problems  chest pain  fast or irregular heartbeat  low blood counts - this medicine may decrease the number of white blood cells, red blood cells and platelets. You may be at increased risk for infections and bleeding.  pain, redness, or irritation at site where injected  signs of infection - fever or chills, cough, sore throat, pain or difficulty passing urine  signs of decreased platelets or bleeding - bruising, pinpoint red spots on the skin, black, tarry stools, blood in the urine  swelling of the ankles, feet, hands  tiredness  weakness Side effects that usually do not require medical attention (report to your doctor or health care professional if they continue or are bothersome):  diarrhea  hair loss  mouth sores  nail discoloration or damage  nausea  red colored urine  vomiting This list may not describe all possible side effects. Call your doctor for medical advice about side effects. You may report side effects to FDA at 1-800-FDA-1088. Where should I keep my medicine? This drug is given in a hospital or clinic and will not be stored at home. NOTE:  This sheet is a summary. It may not cover all possible information. If you have questions about this medicine, talk to your doctor, pharmacist, or health care provider.  2020 Elsevier/Gold Standard (2016-12-10 11:01:26)  

## 2020-05-14 NOTE — Progress Notes (Signed)
[place]  Telephone:(336) [phone] Fax:(336) [phone]     ID: [name] [name] DOB: 1955/09/15  MR#: [id number]  WCB#:[id number]  Patient Care Team: Kristie Cowman, MD as PCP - General (Family Medicine) Mauro Kaufmann, RN as Oncology Nurse Navigator Rockwell Germany, RN as Oncology Nurse Navigator Magrinat, [name] Dad, MD as Consulting Physician (Oncology) Erroll Luna, MD as Consulting Physician (General Surgery) Eppie Gibson, MD as Attending Physician (Radiation Oncology) Scot Dock, NP OTHER MD:  CHIEF COMPLAINT: Estrogen receptor positive breast cancer  CURRENT TREATMENT: Neoadjuvant chemotherapy   INTERVAL HISTORY: [name] [name] returns today for follow up and treatment of her estrogen receptor positive breast cancer.   We are switching her chemotherapy to cyclophosphamide and doxorubicin because of her problems with neuropathy, and we will do 2-4 cycles of those agents, every 2 or 3 weeks depending on tolerance and evidence of response.    Today she is here for cycle 2 day 1 of treatment with Cyclophosphamdie and Doxorubicin.  I held her treatment last week due to her AKI, dehydration due to nausea, and hyponatremia.  She received one liter of IV fluids and returned the following Friday 05/11/20 for IV fluids.  On that day her kidney function had improved some, and her sodium was lower.  She took some salt tabs and today her labs have improved.  She is feeling better.    Her most recent echocardiogram on 04/17/2020 showed an ejection fraction of 55-60%.   REVIEW OF SYSTEMS: [name] [name] is feeling better today.  She notes the past 2 days have been good.  She denies any recent fever, chills, chest pain, palpitations, cough, bowel/bladder changes, headaches, vision issues, nausea, vomiting, or further concerns.  A detailed ROS Was otherwise non contributory.     COVID 4 VACCINATION STATUS: s/p    X2, most recently June 2021, no booster as of 05/01/2020   HISTORY OF  CURRENT ILLNESS: From the original intake note:  [name] [name] herself palpated a mass in the right axilla "a long time ago", more recently with complaints of associated pain. She brought it to medical attention and underwent bilateral diagnostic mammography with tomography and bilateral breast ultrasonography at [facility] on 01/31/2020 showing: breast density category B; three right breast masses-- 1.9 cm at 12 o'clock, 2.3 cm at 11 o'clock, 0.4 cm at 1 o'clock; two adjacent enlarged thickened lymph nodes measuring up to 2.1 cm; no evidence of left breast malignancy.  Accordingly on 02/03/2020 she proceeded to biopsy of the right breast area at 12 o'clock and right axilla. The pathology from this procedure ([id number]) showed: invasive ductal carcinoma, grade 3. Prognostic indicators significant for: estrogen receptor, 100% positive with strong staining intensity and progesterone receptor, 0% negative. Proliferation marker Ki67 at 60%. HER2 equivocal by immunohistochemistry (2+), but negative by fluorescent in situ hybridization with a signals ratio 0.81 and number per cell 2.7.  The biopsied lymph node was found to show metastatic carcinoma. The morphology was considered different from the biopsied mass, and a prognostic panel was performed. Estrogen receptor, 100% positive with strong staining intensity and progesterone receptor, 0% negative. Proliferation marker Ki67 at 70%. HER2 equivcoal by immunohistochemistry (2+), but negative) by fluorescent in situ hybridization with a signals ratio 1.16 and number per cell 3.9.  She underwent additional right breast biopsies on 02/06/2020. Pathology [id number]) from the mass at 11 o'clock showed: invasive ductal carcinoma, grade 2. Prognostic indicators significant for: estrogen receptor, 100% positive with strong staining intensity and progesterone receptor, 0%  negative. Proliferation marker Ki67 at 15%. HER2 equivocal by immunohistochemistry (2+), but  negative by fluorescent in situ hybridization with a signals ratio 1.29 and number per cell 2.7.  The biopsied mass at 1 o'clock showed only fibrocystic change.  The patient's subsequent history is as detailed below.   PAST MEDICAL HISTORY: Past Medical History:  Diagnosis Date  . Arthritis   . Diabetes mellitus without complication ([place])   . Hypertension     PAST SURGICAL HISTORY: Past Surgical History:  Procedure Laterality Date  . IR IMAGING GUIDED PORT INSERTION  02/23/2020    FAMILY HISTORY: Family History  Problem Relation Age of Onset  . Asthma Mother   . Arthritis Sister   . Asthma Brother   . Arthritis Brother   [name] died in his 2s from causes unknown to the patient.  [name] died from heart disease at age 67.  The patient had 4 brothers, 1 sister, with no history of cancer in the family to her knowledge   GYNECOLOGIC HISTORY:  No LMP recorded. Patient is postmenopausal. Menarche: 65 years old Age at first live birth: 65 years old [place] P 1 LMP 46s Contraceptive HRT no  Hysterectomy? no BSO? no   SOCIAL HISTORY: (updated 02/2020)  [name] [name] is originally from [place].  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  She describes herself as single.  At home she lives with her daughter [name] [name] who works for [employer], her daughters husband [name] who is a English as a second language teacher, and their children who are 40 months old and 32 years old.  The patient is in [place]: Not in place.  The patient tells me she would intend to name her daughter [name] [name] as her healthcare power of attorney   HEALTH MAINTENANCE: Social History   Tobacco Use  . Smoking status: Never Smoker  . Smokeless tobacco: Never Used  Vaping Use  . Vaping Use: Never used  Substance Use Topics  . Alcohol use: Yes    Alcohol/week: 1.0 standard drink    Types: 1 Glasses of wine per week    Comment: occasionally  . Drug use: Never      Colonoscopy: never done  PAP: 2017 (performed in [place])  Bone density:    Allergies  Allergen Reactions  . Sulfa Antibiotics Itching    Current Outpatient Medications  Medication Sig Dispense Refill  . acetaminophen (TYLENOL) 500 MG tablet Take 500 mg by mouth 3 (three) times daily as needed.    Marland Kitchen amLODipine (NORVASC) 5 MG tablet Take 5 mg by mouth daily.    Marland Kitchen atorvastatin (LIPITOR) 40 MG tablet Take 40 mg by mouth daily.    . carvedilol (COREG) 6.25 MG tablet Take 6.25 mg by mouth 2 (two) times daily with a meal.    . celecoxib (CELEBREX) 200 MG capsule Take 200 mg by mouth daily.     Marland Kitchen dexamethasone (DECADRON) 4 MG tablet Take 2 tablets BID the day before chemotherapy, then 2 tablets BID for three days after chemotherapy.  Then take 1 tablet daily x 5 days, then stop. 30 tablet 0  . dexamethasone (DECADRON) 4 MG tablet Take 2 tablets (8 mg total) by mouth daily. Take daily for 3 days after chemo. Take with food. 30 tablet 1  . fluconazole (DIFLUCAN) 200 MG tablet Take 1 tablet (200 mg total) by mouth daily. (Patient not taking: Reported on 03/26/2020) 30 tablet 0  . lansoprazole (  PREVACID) 30 MG capsule Take 30 mg by mouth every morning.    Marland Kitchen losartan (COZAAR) 100 MG tablet Take 100 mg by mouth daily.    . metFORMIN (GLUCOPHAGE) 1000 MG tablet Take 1,000 mg by mouth daily with breakfast.     . prochlorperazine (COMPAZINE) 10 MG tablet Take 1 tablet (10 mg total) by mouth every 6 (six) hours as needed (Nausea or vomiting). 30 tablet 1  . Rivaroxaban (XARELTO) 15 MG TABS tablet Take 1 tablet (15 mg total) by mouth 2 (two) times daily with a meal. 42 tablet 0  . sodium chloride 1 g tablet Take 1 tablet (1 g total) by mouth 3 (three) times daily. 15 tablet 0   No current facility-administered medications for this visit.    OBJECTIVE: African-American woman who appears older than stated age  65:   05/14/20 1021  BP: 105/64  Pulse: 73  Resp: 19  Temp: (!) 97.2 F  (36.2 C)  SpO2: 100%     Body mass index is 34.12 kg/m.   Wt Readings from Last 3 Encounters:  05/14/20 211 lb 6.4 oz (95.9 kg)  05/07/20 211 lb 12.8 oz (96.1 kg)  05/01/20 213 lb 14.4 oz (97 kg)      ECOG FS:2 - Symptomatic, <50% confined to bed GENERAL: Patient is a well appearing female in no acute distress HEENT:  Sclerae anicteric.  MMM. Neck is supple.  NODES:  No cervical, supraclavicular, or axillary lymphadenopathy palpated.  BREAST EXAM:  Unable to palpate right breast mass LUNGS:  Clear to auscultation bilaterally.  No wheezes or rhonchi. HEART:  Regular rate and rhythm. No murmur appreciated. ABDOMEN:  Soft, nontender.  Positive, normoactive bowel sounds. No organomegaly palpated. MSK:  No focal spinal tenderness to palpation. Full range of motion bilaterally in the upper extremities. EXTREMITIES:  No peripheral edema.   SKIN:  Clear with no obvious rashes or skin changes. No nail dyscrasia. NEURO:  Nonfocal. Well oriented.  Appropriate affect.     LAB RESULTS:  CMP     Component Value Date/Time   NA 139 05/14/2020 0935   K 4.6 05/14/2020 0935   CL 110 05/14/2020 0935   CO2 21 (L) 05/14/2020 0935   GLUCOSE 144 (H) 05/14/2020 0935   BUN 6 (L) 05/14/2020 0935   CREATININE 0.81 05/14/2020 0935   CREATININE 0.76 04/02/2020 0900   CALCIUM 9.2 05/14/2020 0935   PROT 6.5 05/14/2020 0935   ALBUMIN 3.0 (L) 05/14/2020 0935   AST 14 (L) 05/14/2020 0935   AST 10 (L) 04/02/2020 0900   ALT 20 05/14/2020 0935   ALT 14 04/02/2020 0900   ALKPHOS 108 05/14/2020 0935   BILITOT 0.4 05/14/2020 0935   BILITOT 0.6 04/02/2020 0900   GFRNONAA >60 05/14/2020 0935   GFRNONAA >60 04/02/2020 0900    No results found for: TOTALPROTELP, ALBUMINELP, A1GS, A2GS, BETS, BETA2SER, GAMS, MSPIKE, SPEI  Lab Results  Component Value Date   WBC 7.3 05/14/2020   NEUTROABS 5.6 05/14/2020   HGB 10.0 (L) 05/14/2020   HCT 29.5 (L) 05/14/2020   MCV 86.8 05/14/2020   PLT 537 (H) 05/14/2020     No results found for: LABCA2  No components found for: BWLSLH734  No results for input(s): INR in the last 168 hours.  No results found for: LABCA2  No results found for: KAJ681  No results found for: LXB262  No results found for: MBT597  No results found for: CA2729  No components found for: HGQUANT  No results found for: CEA1 / No results found for: CEA1   No results found for: AFPTUMOR  No results found for: CHROMOGRNA  No results found for: KPAFRELGTCHN, LAMBDASER, KAPLAMBRATIO (kappa/lambda light chains)  No results found for: HGBA, HGBA2QUANT, HGBFQUANT, HGBSQUAN (Hemoglobinopathy evaluation)   No results found for: LDH  No results found for: IRON, TIBC, IRONPCTSAT (Iron and TIBC)  No results found for: FERRITIN  Urinalysis No results found for: COLORURINE, APPEARANCEUR, LABSPEC, PHURINE, GLUCOSEU, HGBUR, BILIRUBINUR, KETONESUR, PROTEINUR, UROBILINOGEN, NITRITE, LEUKOCYTESUR   STUDIES: CT Chest W Contrast  Result Date: 04/24/2020 CLINICAL DATA:  Left upper extremity swelling. Estrogen receptor positive breast cancer on neoadjuvant chemotherapy. EXAM: CT CHEST WITH CONTRAST TECHNIQUE: Multidetector CT imaging of the chest was performed during intravenous contrast administration. CONTRAST:  24m OMNIPAQUE IOHEXOL 300 MG/ML  SOLN COMPARISON:  None. FINDINGS: Cardiovascular: Normal heart size. No significant pericardial effusion/thickening. Left internal jugular Port-A-Cath terminates in the lower third of the SVC. Suggestion of a small nonocclusive catheter associated thrombus in the left brachiocephalic vein (series 2/image 31). Atherosclerotic nonaneurysmal thoracic aorta. Top-normal caliber main pulmonary artery (3.3 cm diameter). No central pulmonary emboli. Mediastinum/Nodes: No discrete thyroid nodules. Unremarkable esophagus. Multiple mildly enlarged right axillary lymph nodes, largest with short axis diameter 1.4 cm (series 2/image 36). No left axillary  adenopathy. Lungs/Pleura: No pneumothorax. No pleural effusion. No acute consolidative airspace disease or lung masses. Tiny 2 mm posterior right upper lobe pulmonary nodule (series 5/image 27). No additional significant pulmonary nodules. Upper abdomen: Small hiatal hernia. Musculoskeletal: No aggressive appearing focal osseous lesions. Clustered right breast 1.5 cm and 1.4 cm soft tissue masses (series 2/images 60 and 58, respectively). Moderate lower thoracic spondylosis. IMPRESSION: 1. Suggestion of a small nonocclusive catheter associated thrombus in the left brachiocephalic vein. Left internal jugular Port-A-Cath is well positioned with tip in the lower third of the SVC. 2. Clustered right breast soft tissue masses, presumably representing known primary breast malignancy. 3. Multiple mildly enlarged right axillary lymph nodes, compatible with metastatic disease. 4. Tiny 2 mm posterior right upper lobe pulmonary nodule, for which 3 month chest CT follow-up is recommended. 5. Small hiatal hernia. 6. Aortic Atherosclerosis (ICD10-I70.0). These results will be called to the ordering clinician or representative by the Radiologist Assistant, and communication documented in the PACS or [employer] Electronically Signed   By: JIlona SorrelM.D.   On: 04/24/2020 09:10   ECHOCARDIOGRAM COMPLETE  Result Date: 04/17/2020    ECHOCARDIOGRAM REPORT   [facility] Date of Exam: 04/17/2020 Medical Rec #:  [id number]  Height:       66.0 in Accession #:    [id number] Weight:       221.8 lb Date of Birth:  1Dec 21, 1957 BSA:          2.090 m Patient Age:    89years    BP:           97/59 mmHg Patient Gender: F           HR:           69 bpm. Exam Location:  Outpatient Procedure: 2D Echo, 3D Echo, Cardiac Doppler, Color Doppler and Strain Analysis Indications:    Z51.11 Encounter for antineoplastic chemotheraphy  History:        Patient has no prior history of Echocardiogram examinations.                  Risk Factors:Hypertension and Diabetes.  Sonographer:  [name] Dance Referring Phys: [place]  1. Left ventricular ejection fraction, by estimation, is 55 to 60%. The left ventricle has normal function. The left ventricle has no regional wall motion abnormalities. There is mild concentric left ventricular hypertrophy. Left ventricular diastolic parameters are consistent with Grade I diastolic dysfunction (impaired relaxation). The average left ventricular global longitudinal strain is -16.5 %. The global longitudinal strain is abnormal.  2. Right ventricular systolic function is normal. The right ventricular size is normal. There is normal pulmonary artery systolic pressure.  3. Left atrial size was mildly dilated.  4. The mitral valve is normal in structure. Trivial mitral valve regurgitation. No evidence of mitral stenosis.  5. The aortic valve is normal in structure. Aortic valve regurgitation is not visualized. No aortic stenosis is present.  6. There is borderline dilatation of the ascending aorta, measuring 40 mm.  7. The inferior vena cava is normal in size with greater than 50% respiratory variability, suggesting right atrial pressure of 3 mmHg. Comparison(s): No prior Echocardiogram. FINDINGS  Left Ventricle: Left ventricular ejection fraction, by estimation, is 55 to 60%. The left ventricle has normal function. The left ventricle has no regional wall motion abnormalities. The average left ventricular global longitudinal strain is -16.5 %. The global longitudinal strain is abnormal. The left ventricular internal cavity size was normal in size. There is mild concentric left ventricular hypertrophy. Left ventricular diastolic parameters are consistent with Grade I diastolic dysfunction (impaired relaxation). Normal left ventricular filling pressure. Right Ventricle: The right ventricular size is normal. No increase in right ventricular wall thickness. Right ventricular systolic  function is normal. There is normal pulmonary artery systolic pressure. The tricuspid regurgitant velocity is 2.17 m/s, and  with an assumed right atrial pressure of 3 mmHg, the estimated right ventricular systolic pressure is 96.7 mmHg. Left Atrium: Left atrial size was mildly dilated. Right Atrium: Right atrial size was normal in size. Pericardium: There is no evidence of pericardial effusion. Mitral Valve: The mitral valve is normal in structure. Trivial mitral valve regurgitation. No evidence of mitral valve stenosis. Tricuspid Valve: The tricuspid valve is normal in structure. Tricuspid valve regurgitation is mild . No evidence of tricuspid stenosis. Aortic Valve: The aortic valve is normal in structure. Aortic valve regurgitation is not visualized. No aortic stenosis is present. Pulmonic Valve: The pulmonic valve was normal in structure. Pulmonic valve regurgitation is not visualized. No evidence of pulmonic stenosis. Aorta: The aortic root is normal in size and structure. There is borderline dilatation of the ascending aorta, measuring 40 mm. Venous: The inferior vena cava is normal in size with greater than 50% respiratory variability, suggesting right atrial pressure of 3 mmHg. IAS/Shunts: No atrial level shunt detected by color flow Doppler.  LEFT VENTRICLE PLAX 2D LVIDd:         4.00 cm  Diastology LVIDs:         2.60 cm  LV e' medial:    7.40 cm/s LV PW:         1.40 cm  LV E/e' medial:  10.1 LV IVS:        1.00 cm  LV e' lateral:   9.57 cm/s LVOT diam:     2.00 cm  LV E/e' lateral: 7.8 LV SV:         76 LV SV Index:   36       2D Longitudinal Strain LVOT Area:     3.14 cm 2D Strain GLS Avg:     -  16.5 %                          3D Volume EF:                         3D EF:        58 %                         LV EDV:       142 ml                         LV ESV:       59 ml                         LV SV:        83 ml RIGHT VENTRICLE             IVC RV Basal diam:  2.90 cm     IVC diam: 1.50 cm RV S prime:      12.70 cm/s TAPSE (M-mode): 2.1 cm LEFT ATRIUM             Index       RIGHT ATRIUM           Index LA diam:        4.30 cm 2.06 cm/m  RA Area:     14.60 cm LA Vol (A2C):   69.3 ml 33.16 ml/m RA Volume:   33.40 ml  15.98 ml/m LA Vol (A4C):   49.6 ml 23.74 ml/m LA Biplane Vol: 63.3 ml 30.29 ml/m  AORTIC VALVE LVOT Vmax:   132.00 cm/s LVOT Vmean:  84.300 cm/s LVOT VTI:    0.242 m  AORTA Ao Root diam: 3.10 cm Ao Asc diam:  4.00 cm MITRAL VALVE               TRICUSPID VALVE MV Area (PHT): 2.62 cm    TR Peak grad:   18.8 mmHg MV Decel Time: 289 msec    TR Vmax:        217.00 cm/s MV E velocity: 74.70 cm/s MV A velocity: 92.40 cm/s  SHUNTS MV E/A ratio:  0.81        Systemic VTI:  0.24 m                            Systemic Diam: 2.00 cm Ena Dawley MD Electronically signed by Ena Dawley MD Signature Date/Time: 04/17/2020/12:05:08 PM    Final      ELIGIBLE FOR AVAILABLE RESEARCH PROTOCOL: AET  ASSESSMENT: 65 y.o. [place] woman status post right breast upper outer quadrant (12:00) and axillary lymph node biopsy 02/03/2020, both positive for a clinical T1c N1, stage IIB invasive ductal carcinoma, grade 3, estrogen receptor positive, HER-2 and progesterone receptor negative, with an MIB-1 of 60%.  (a) biopsy of a second right upper outer quadrant T2 lesion (11 o'clock) 02/06/2020 also showed invasive ductal carcinoma, but grade 2, estrogen receptor positive, progesterone receptor and HER-2 negative, with an MIB-1 of 15%  (b) biopsy of a third right breast lesion (1:00) was benign   (1) neoadjuvant chemotherapy consisting of cyclophosphamide and docetaxel every 21 days x 4 starting 03/05/2020  (a) discontinued after 2 cycles because of peripheral neuropathy  (b) cyclophosphamide/doxorubicin  started 04/24/2020, to be repeated x4  (c) echo 04/17/2020 shows EF of 55-60%  (2) definitive surgery to follow  (3) adjuvant radiation  (4) antiestrogens to follow  (5) CT scan of the chest  04/23/2020 shows a left brachiocephalicl catheter associated clot  (a) rivaroxaban started 04/24/2020   PLAN: Emanuela is looking better since taking an additional week off of chemotherapy, and getting rehydrated.  She will proceed with her treatment today with Doxorubicin and Cyclophosphamide, however will do this at a dose reduction of 60m/m2 and 5059mm2.  I reviewed this change with pharmacy and this change was reviewed and discussed with Dr. GuLindi Adien detail.    EdVirnaill return more frequently for hydration, and we will continue to monitor her sodium level.  She has f/u with Dr. MaJana Hakimext week.    [name] [name] understands the above and will return as recommended.  She knows to call for any questions that may arise between now and her next appointment.  We are happy to see her sooner if needed.     Total encounter time 30 minutes.* Wilber BihariNP 05/14/20 10:54 AM [facility] On[facility] H[facility]4[place] [id number]. [phone]  Fax. [phone]  *Total Encounter Time as defined by the Centers for Medicare and Medicaid Services includes, in addition to the face-to-face time of a patient visit (documented in the note above) non-face-to-face time: obtaining and reviewing outside history, ordering and reviewing medications, tests or procedures, care coordination (communications with other health care professionals or caregivers) and documentation in the medical record.

## 2020-05-14 NOTE — Progress Notes (Signed)
Per NP Mardella Layman, will decrease both cyclophosphamide and doxorubicin this cycle. Will decrease cyclophosphamide to 500 mg/m2 and doxorubicin to 50 mg/m2. Orders are adjusted for this cycle and subsequent cycles.   Fara Olden, PharmD, BCPS PGY-2 Hematology/Oncology Pharmacy Resident

## 2020-05-16 ENCOUNTER — Other Ambulatory Visit: Payer: Self-pay

## 2020-05-16 ENCOUNTER — Inpatient Hospital Stay: Payer: Self-pay

## 2020-05-16 VITALS — BP 151/85 | HR 77 | Temp 98.4°F | Resp 18

## 2020-05-16 DIAGNOSIS — Z170 Estrogen receptor positive status [ER+]: Secondary | ICD-10-CM

## 2020-05-16 DIAGNOSIS — Z95828 Presence of other vascular implants and grafts: Secondary | ICD-10-CM

## 2020-05-16 DIAGNOSIS — C50411 Malignant neoplasm of upper-outer quadrant of right female breast: Secondary | ICD-10-CM

## 2020-05-16 MED ORDER — SODIUM CHLORIDE 0.9% FLUSH
10.0000 mL | Freq: Once | INTRAVENOUS | Status: AC
  Administered 2020-05-16: 10 mL
  Filled 2020-05-16: qty 10

## 2020-05-16 MED ORDER — SODIUM CHLORIDE 0.9 % IV SOLN
INTRAVENOUS | Status: AC
  Filled 2020-05-16 (×2): qty 250

## 2020-05-16 MED ORDER — HEPARIN SOD (PORK) LOCK FLUSH 100 UNIT/ML IV SOLN
500.0000 [IU] | Freq: Once | INTRAVENOUS | Status: AC
  Administered 2020-05-16: 500 [IU]
  Filled 2020-05-16: qty 5

## 2020-05-16 NOTE — Patient Instructions (Signed)

## 2020-05-19 ENCOUNTER — Inpatient Hospital Stay: Payer: Self-pay

## 2020-05-19 ENCOUNTER — Other Ambulatory Visit: Payer: Self-pay

## 2020-05-19 ENCOUNTER — Other Ambulatory Visit: Payer: Self-pay | Admitting: Adult Health

## 2020-05-19 VITALS — BP 100/70 | HR 74 | Temp 97.8°F | Resp 18

## 2020-05-19 DIAGNOSIS — C50411 Malignant neoplasm of upper-outer quadrant of right female breast: Secondary | ICD-10-CM

## 2020-05-19 DIAGNOSIS — Z170 Estrogen receptor positive status [ER+]: Secondary | ICD-10-CM

## 2020-05-19 DIAGNOSIS — Z95828 Presence of other vascular implants and grafts: Secondary | ICD-10-CM

## 2020-05-19 MED ORDER — SODIUM CHLORIDE 0.9% FLUSH
10.0000 mL | Freq: Once | INTRAVENOUS | Status: AC
  Administered 2020-05-19: 10 mL
  Filled 2020-05-19: qty 10

## 2020-05-19 MED ORDER — SODIUM CHLORIDE 0.9 % IV SOLN
Freq: Once | INTRAVENOUS | Status: AC
  Filled 2020-05-19: qty 250

## 2020-05-19 MED ORDER — HEPARIN SOD (PORK) LOCK FLUSH 100 UNIT/ML IV SOLN
500.0000 [IU] | Freq: Once | INTRAVENOUS | Status: AC
  Administered 2020-05-19: 500 [IU]
  Filled 2020-05-19: qty 5

## 2020-05-19 MED ORDER — SODIUM CHLORIDE 0.9 % IV SOLN
Freq: Once | INTRAVENOUS | Status: DC
  Filled 2020-05-19: qty 250

## 2020-05-19 NOTE — Patient Instructions (Signed)

## 2020-05-20 NOTE — Progress Notes (Signed)
[place]  Telephone:([phone] Fax:([phone]) [phone]     ID: [name] [name] DOB: November 29, 1955  MR#: [id number]  EPP#:[id number]  Patient Care Team: Kristie Cowman, MD as PCP - General (Family Medicine) Mauro Kaufmann, RN as Oncology Nurse Navigator Rockwell Germany, RN as Oncology Nurse Navigator Hiilei Gerst, Virgie Dad, MD as Consulting Physician (Oncology) Erroll Luna, MD as Consulting Physician (General Surgery) Eppie Gibson, MD as Attending Physician (Radiation Oncology) Chauncey Cruel, MD OTHER MD:  CHIEF COMPLAINT: Estrogen receptor positive breast cancer  CURRENT TREATMENT: Neoadjuvant chemotherapy   INTERVAL HISTORY: Radhika returns today for follow up and treatment of her estrogen receptor positive breast cancer.   She continues on neoadjuvant chemotherapy, now consisting of Cyclophosphamdie and Doxorubicin. Today is day 8 cycle 2 of AC (her fourth chemotherapy cycle altogether).  Her most recent echocardiogram on 04/17/2020 showed an ejection fraction of 55-60%.   REVIEW OF SYSTEMS: Azariya tells me her peripheral neuropathy is improved although not completely resolved.  She says her head "spins a bit" sometimes but she has not had any falls.  She feels tired.  She has a dry mouth but denies sores.  She has not had significant nausea or vomiting problems.  She had some loose bowel movements and took Imodium for that.  Sometimes when she lies down she can hear the heartbeat but it does not seem to have been fast.  She tells me her taste perversion is also improved.   COVID 19 VACCINATION STATUS: s/p  [employer]  X2, most recently June 2021, no booster as of 05/01/2020   HISTORY OF CURRENT ILLNESS: From the original intake note:  Ledia Hanford herself palpated a mass in the right axilla "a long time ago", more recently with complaints of associated pain. She brought it to medical attention and underwent bilateral diagnostic mammography with tomography and  bilateral breast ultrasonography at [facility] on 01/31/2020 showing: breast density category B; three right breast masses-- 1.9 cm at 12 o'clock, 2.3 cm at 11 o'clock, 0.4 cm at 1 o'clock; two adjacent enlarged thickened lymph nodes measuring up to 2.1 cm; no evidence of left breast malignancy.  Accordingly on 02/03/2020 she proceeded to biopsy of the right breast area at 12 o'clock and right axilla. The pathology from this procedure ([id number]) showed: invasive ductal carcinoma, grade 3. Prognostic indicators significant for: estrogen receptor, 100% positive with strong staining intensity and progesterone receptor, 0% negative. Proliferation marker Ki67 at 60%. HER2 equivocal by immunohistochemistry (2+), but negative by fluorescent in situ hybridization with a signals ratio 0.81 and number per cell 2.7.  The biopsied lymph node was found to show metastatic carcinoma. The morphology was considered different from the biopsied mass, and a prognostic panel was performed. Estrogen receptor, 100% positive with strong staining intensity and progesterone receptor, 0% negative. Proliferation marker Ki67 at 70%. HER2 equivcoal by immunohistochemistry (2+), but negative) by fluorescent in situ hybridization with a signals ratio 1.16 and number per cell 3.9.  She underwent additional right breast biopsies on 02/06/2020. Pathology [id number]) from the mass at 11 o'clock showed: invasive ductal carcinoma, grade 2. Prognostic indicators significant for: estrogen receptor, 100% positive with strong staining intensity and progesterone receptor, 0% negative. Proliferation marker Ki67 at 15%. HER2 equivocal by immunohistochemistry (2+), but negative by fluorescent in situ hybridization with a signals ratio 1.29 and number per cell 2.7.  The biopsied mass at 1 o'clock showed only fibrocystic change.  The patient's subsequent history is as detailed below.   PAST  MEDICAL HISTORY: Past Medical History:  Diagnosis  Date  . Arthritis   . Diabetes mellitus without complication ([address])   . Hypertension     PAST SURGICAL HISTORY: Past Surgical History:  Procedure Laterality Date  . IR IMAGING GUIDED PORT INSERTION  02/23/2020    FAMILY HISTORY: Family History  Problem Relation Age of Onset  . Asthma Mother   . Arthritis Sister   . Asthma Brother   . Arthritis Brother   [name] died in his 69s from causes unknown to the patient.  [name] died from heart disease at age 7.  The patient had 4 brothers, 1 sister, with no history of cancer in the family to her knowledge   GYNECOLOGIC HISTORY:  No LMP recorded. Patient is postmenopausal. Menarche: 65 years old Age at first live birth: 65 years old [place] P 1 LMP 35s Contraceptive HRT no  Hysterectomy? no BSO? no   SOCIAL HISTORY: (updated 02/2020)  Jelani is originally from [place].  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  She describes herself as single.  At home she lives with her daughter [name] who works for [employer], her daughters husband [name] who is a English as a second language teacher, and their children who are 20 months old and 93 years old.  The patient is in [place]: Not in place.  The patient tells me she would intend to name her daughter [name] as her healthcare power of attorney   HEALTH MAINTENANCE: Social History   Tobacco Use  . Smoking status: Never Smoker  . Smokeless tobacco: Never Used  Vaping Use  . Vaping Use: Never used  Substance Use Topics  . Alcohol use: Yes    Alcohol/week: 1.0 standard drink    Types: 1 Glasses of wine per week    Comment: occasionally  . Drug use: Never     Colonoscopy: never done  PAP: 2017 (performed in [place])  Bone density:    Allergies  Allergen Reactions  . Sulfa Antibiotics Itching    Current Outpatient Medications  Medication Sig Dispense Refill  . acetaminophen (TYLENOL) 500 MG tablet Take 500 mg by mouth 3  (three) times daily as needed.    Marland Kitchen amLODipine (NORVASC) 5 MG tablet Take 5 mg by mouth daily.    Marland Kitchen atorvastatin (LIPITOR) 40 MG tablet Take 40 mg by mouth daily.    . carvedilol (COREG) 6.25 MG tablet Take 6.25 mg by mouth 2 (two) times daily with a meal.    . celecoxib (CELEBREX) 200 MG capsule Take 200 mg by mouth daily.     Marland Kitchen dexamethasone (DECADRON) 4 MG tablet Take 2 tablets (8 mg total) by mouth daily. Take daily for 3 days after chemo. Take with food. 30 tablet 1  . fluconazole (DIFLUCAN) 200 MG tablet Take 1 tablet (200 mg total) by mouth daily. (Patient not taking: Reported on 03/26/2020) 30 tablet 0  . lansoprazole (PREVACID) 30 MG capsule Take 30 mg by mouth every morning.    Marland Kitchen losartan (COZAAR) 100 MG tablet Take 100 mg by mouth daily.    . metFORMIN (GLUCOPHAGE) 1000 MG tablet Take 1,000 mg by mouth daily with breakfast.     . prochlorperazine (COMPAZINE) 10 MG tablet Take 1 tablet (10 mg total) by mouth every 6 (six) hours as needed (Nausea or vomiting). 30 tablet 1  . rivaroxaban (XARELTO) 20 MG TABS tablet Take 1 tablet (20 mg total) by mouth  daily with supper. 90 tablet 2   No current facility-administered medications for this visit.    OBJECTIVE: African-American woman who appears older than stated age  Vitals:   05/21/20 1123  BP: 121/70  Pulse: 72  Resp: 18  Temp: 97.7 F (36.5 C)  SpO2: 99%     Body mass index is 34.41 kg/m.   Wt Readings from Last 3 Encounters:  05/21/20 213 lb 3.2 oz (96.7 kg)  05/14/20 211 lb 6.4 oz (95.9 kg)  05/07/20 211 lb 12.8 oz (96.1 kg)      ECOG FS:2 - Symptomatic, <50% confined to bed  Sclerae unicteric, EOMs intact Wearing a mask No cervical or supraclavicular adenopathy Lungs no rales or rhonchi Heart regular rate and rhythm Abd soft, nontender, positive bowel sounds MSK no focal spinal tenderness, no upper extremity lymphedema Neuro: nonfocal, well oriented, appropriate affect Breasts: I was not able to palpate a  well-defined right breast mass.   LAB RESULTS:  CMP     Component Value Date/Time   NA 139 05/14/2020 0935   K 4.6 05/14/2020 0935   CL 110 05/14/2020 0935   CO2 21 (L) 05/14/2020 0935   GLUCOSE 144 (H) 05/14/2020 0935   BUN 6 (L) 05/14/2020 0935   CREATININE 0.81 05/14/2020 0935   CREATININE 0.76 04/02/2020 0900   CALCIUM 9.2 05/14/2020 0935   PROT 6.5 05/14/2020 0935   ALBUMIN 3.0 (L) 05/14/2020 0935   AST 14 (L) 05/14/2020 0935   AST 10 (L) 04/02/2020 0900   ALT 20 05/14/2020 0935   ALT 14 04/02/2020 0900   ALKPHOS 108 05/14/2020 0935   BILITOT 0.4 05/14/2020 0935   BILITOT 0.6 04/02/2020 0900   GFRNONAA >60 05/14/2020 0935   GFRNONAA >60 04/02/2020 0900    No results found for: TOTALPROTELP, ALBUMINELP, A1GS, A2GS, BETS, BETA2SER, GAMS, MSPIKE, SPEI  Lab Results  Component Value Date   WBC 7.3 05/14/2020   NEUTROABS 5.6 05/14/2020   HGB 10.0 (L) 05/14/2020   HCT 29.5 (L) 05/14/2020   MCV 86.8 05/14/2020   PLT 537 (H) 05/14/2020    No results found for: LABCA2  No components found for: ASNKNL976  No results for input(s): INR in the last 168 hours.  No results found for: LABCA2  No results found for: BHA193  No results found for: XTK240  No results found for: XBD532  No results found for: CA2729  No components found for: HGQUANT  No results found for: CEA1 / No results found for: CEA1   No results found for: AFPTUMOR  No results found for: CHROMOGRNA  No results found for: KPAFRELGTCHN, LAMBDASER, KAPLAMBRATIO (kappa/lambda light chains)  No results found for: HGBA, HGBA2QUANT, HGBFQUANT, HGBSQUAN (Hemoglobinopathy evaluation)   No results found for: LDH  No results found for: IRON, TIBC, IRONPCTSAT (Iron and TIBC)  No results found for: FERRITIN  Urinalysis No results found for: COLORURINE, APPEARANCEUR, LABSPEC, PHURINE, GLUCOSEU, HGBUR, BILIRUBINUR, KETONESUR, PROTEINUR, UROBILINOGEN, NITRITE, LEUKOCYTESUR   STUDIES: CT Chest W  Contrast  Result Date: 04/24/2020 CLINICAL DATA:  Left upper extremity swelling. Estrogen receptor positive breast cancer on neoadjuvant chemotherapy. EXAM: CT CHEST WITH CONTRAST TECHNIQUE: Multidetector CT imaging of the chest was performed during intravenous contrast administration. CONTRAST:  22mL OMNIPAQUE IOHEXOL 300 MG/ML  SOLN COMPARISON:  None. FINDINGS: Cardiovascular: Normal heart size. No significant pericardial effusion/thickening. Left internal jugular Port-A-Cath terminates in the lower third of the SVC. Suggestion of a small nonocclusive catheter associated thrombus in the left brachiocephalic vein (series  2/image 31). Atherosclerotic nonaneurysmal thoracic aorta. Top-normal caliber main pulmonary artery (3.3 cm diameter). No central pulmonary emboli. Mediastinum/Nodes: No discrete thyroid nodules. Unremarkable esophagus. Multiple mildly enlarged right axillary lymph nodes, largest with short axis diameter 1.4 cm (series 2/image 36). No left axillary adenopathy. Lungs/Pleura: No pneumothorax. No pleural effusion. No acute consolidative airspace disease or lung masses. Tiny 2 mm posterior right upper lobe pulmonary nodule (series 5/image 27). No additional significant pulmonary nodules. Upper abdomen: Small hiatal hernia. Musculoskeletal: No aggressive appearing focal osseous lesions. Clustered right breast 1.5 cm and 1.4 cm soft tissue masses (series 2/images 60 and 58, respectively). Moderate lower thoracic spondylosis. IMPRESSION: 1. Suggestion of a small nonocclusive catheter associated thrombus in the left brachiocephalic vein. Left internal jugular Port-A-Cath is well positioned with tip in the lower third of the SVC. 2. Clustered right breast soft tissue masses, presumably representing known primary breast malignancy. 3. Multiple mildly enlarged right axillary lymph nodes, compatible with metastatic disease. 4. Tiny 2 mm posterior right upper lobe pulmonary nodule, for which 3 month chest CT  follow-up is recommended. 5. Small hiatal hernia. 6. Aortic Atherosclerosis (ICD10-I70.0). These results will be called to the ordering clinician or representative by the Radiologist Assistant, and communication documented in the PACS or [employer]. Electronically Signed   By: Ilona Sorrel M.D.   On: 04/24/2020 09:10     ELIGIBLE FOR AVAILABLE RESEARCH PROTOCOL: AET  ASSESSMENT: 65 y.o. [place] woman status post right breast upper outer quadrant (12:00) and axillary lymph node biopsy 02/03/2020, both positive for a clinical T1c N1, stage IIB invasive ductal carcinoma, grade 3, estrogen receptor positive, HER-2 and progesterone receptor negative, with an MIB-1 of 60%.  (a) biopsy of a second right upper outer quadrant T2 lesion (11 o'clock) 02/06/2020 also showed invasive ductal carcinoma, but grade 2, estrogen receptor positive, progesterone receptor and HER-2 negative, with an MIB-1 of 15%  (b) biopsy of a third right breast lesion (1:00) was benign   (1) neoadjuvant chemotherapy consisting of cyclophosphamide and docetaxel every 21 days x 4 starting 03/05/2020  (a) discontinued after 2 cycles because of peripheral neuropathy  (b) cyclophosphamide/doxorubicin started 04/24/2020, to be repeated x4  (c) echo 04/17/2020 shows EF of 55-60%  (2) definitive surgery to follow  (3) adjuvant radiation  (4) antiestrogens to follow  (5) CT scan of the chest 04/23/2020 shows a left brachiocephalicl catheter associated clot  (a) rivaroxaban started 04/24/2020   PLAN: [place] is tolerating treatment generally well.  We are proceeding with cycle #3 06/05/2020.  Assuming all goes well then she will complete her chemotherapy 3 weeks after that.  I suggested if she wishes to receive a booster the best time to do it is approximately 4 days after her next chemo dose.  I am pleased that her peripheral neuropathy is improving.  We are going to give her some fluids today for general support.  She  knows to call for any other issue that may develop before the next visit  Total encounter time 25 minutes.Sarajane Jews C. Feliberto Stockley, MD 05/21/20 11:30 AM Medical Oncology and Hematology [facility] [place], [place] [address] Tel. [phone]    Fax. [phone]   I, Wilburn Mylar, am acting as scribe for Dr. Virgie Dad. Elnore Cosens.  I, Lurline Del MD, have reviewed the above documentation for accuracy and completeness, and I agree with the above.    *Total Encounter Time as defined by the Centers for Medicare and Medicaid Services  includes, in addition to the face-to-face time of a patient visit (documented in the note above) non-face-to-face time: obtaining and reviewing outside history, ordering and reviewing medications, tests or procedures, care coordination (communications with other health care professionals or caregivers) and documentation in the medical record.

## 2020-05-21 ENCOUNTER — Inpatient Hospital Stay (HOSPITAL_BASED_OUTPATIENT_CLINIC_OR_DEPARTMENT_OTHER): Payer: Self-pay | Admitting: Oncology

## 2020-05-21 ENCOUNTER — Inpatient Hospital Stay: Payer: Self-pay

## 2020-05-21 ENCOUNTER — Other Ambulatory Visit: Payer: Self-pay

## 2020-05-21 VITALS — BP 121/70 | HR 72 | Temp 97.7°F | Resp 18 | Ht 66.0 in | Wt 213.2 lb

## 2020-05-21 VITALS — BP 108/65 | HR 85

## 2020-05-21 DIAGNOSIS — C50411 Malignant neoplasm of upper-outer quadrant of right female breast: Secondary | ICD-10-CM

## 2020-05-21 DIAGNOSIS — Z95828 Presence of other vascular implants and grafts: Secondary | ICD-10-CM

## 2020-05-21 DIAGNOSIS — Z170 Estrogen receptor positive status [ER+]: Secondary | ICD-10-CM

## 2020-05-21 LAB — CBC WITH DIFFERENTIAL/PLATELET
Abs Immature Granulocytes: 0.09 10*3/uL — ABNORMAL HIGH (ref 0.00–0.07)
Basophils Absolute: 0 10*3/uL (ref 0.0–0.1)
Basophils Relative: 0 %
Eosinophils Absolute: 0 10*3/uL (ref 0.0–0.5)
Eosinophils Relative: 1 %
HCT: 29.2 % — ABNORMAL LOW (ref 36.0–46.0)
Hemoglobin: 9.8 g/dL — ABNORMAL LOW (ref 12.0–15.0)
Immature Granulocytes: 3 %
Lymphocytes Relative: 13 %
Lymphs Abs: 0.4 10*3/uL — ABNORMAL LOW (ref 0.7–4.0)
MCH: 29.6 pg (ref 26.0–34.0)
MCHC: 33.6 g/dL (ref 30.0–36.0)
MCV: 88.2 fL (ref 80.0–100.0)
Monocytes Absolute: 0 10*3/uL — ABNORMAL LOW (ref 0.1–1.0)
Monocytes Relative: 1 %
Neutro Abs: 2.4 10*3/uL (ref 1.7–7.7)
Neutrophils Relative %: 82 %
Platelets: 280 10*3/uL (ref 150–400)
RBC: 3.31 MIL/uL — ABNORMAL LOW (ref 3.87–5.11)
RDW: 15.3 % (ref 11.5–15.5)
WBC: 2.9 10*3/uL — ABNORMAL LOW (ref 4.0–10.5)
nRBC: 0 % (ref 0.0–0.2)

## 2020-05-21 LAB — COMPREHENSIVE METABOLIC PANEL
ALT: 10 U/L (ref 0–44)
AST: 7 U/L — ABNORMAL LOW (ref 15–41)
Albumin: 3 g/dL — ABNORMAL LOW (ref 3.5–5.0)
Alkaline Phosphatase: 66 U/L (ref 38–126)
Anion gap: 8 (ref 5–15)
BUN: 13 mg/dL (ref 8–23)
CO2: 24 mmol/L (ref 22–32)
Calcium: 8.8 mg/dL — ABNORMAL LOW (ref 8.9–10.3)
Chloride: 105 mmol/L (ref 98–111)
Creatinine, Ser: 0.7 mg/dL (ref 0.44–1.00)
GFR, Estimated: >60 mL/min (ref >60)
Glucose, Bld: 109 mg/dL — ABNORMAL HIGH (ref 70–99)
Potassium: 4.1 mmol/L (ref 3.5–5.1)
Sodium: 137 mmol/L (ref 135–145)
Total Bilirubin: 0.6 mg/dL (ref 0.3–1.2)
Total Protein: 5.8 g/dL — ABNORMAL LOW (ref 6.5–8.1)

## 2020-05-21 MED ORDER — HEPARIN SOD (PORK) LOCK FLUSH 100 UNIT/ML IV SOLN
500.0000 [IU] | Freq: Once | INTRAVENOUS | Status: AC
  Administered 2020-05-21: 500 [IU]
  Filled 2020-05-21: qty 5

## 2020-05-21 MED ORDER — SODIUM CHLORIDE 0.9 % IV SOLN
Freq: Once | INTRAVENOUS | Status: AC
  Filled 2020-05-21: qty 250

## 2020-05-21 MED ORDER — SODIUM CHLORIDE 0.9% FLUSH
10.0000 mL | Freq: Once | INTRAVENOUS | Status: AC
  Administered 2020-05-21: 10 mL
  Filled 2020-05-21: qty 10

## 2020-05-21 NOTE — Patient Instructions (Signed)

## 2020-05-22 ENCOUNTER — Telehealth: Payer: Self-pay | Admitting: Oncology

## 2020-05-22 NOTE — Telephone Encounter (Signed)
Cancelled injection appt per 1/10 los. No other changes made to pt's schedule.

## 2020-05-23 ENCOUNTER — Ambulatory Visit: Payer: Self-pay

## 2020-05-25 NOTE — Telephone Encounter (Deleted)
No entry 

## 2020-05-25 NOTE — Telephone Encounter (Signed)
No entry 

## 2020-05-29 NOTE — Telephone Encounter (Deleted)
No entry 

## 2020-06-04 NOTE — Progress Notes (Signed)
[facility]  [phone]) [phone] [phone]     ID: [name] [name] DOB: 03/11/1956  MR#: [id number]  GMW#:[id number]  Patient Care Team: Knox Royalty, MD as PCP - General (Family Medicine) Pershing Proud, RN as Oncology Nurse Navigator Donnelly Angelica, RN as Oncology Nurse Navigator Gearl Baratta, Valentino Hue, MD as Consulting Physician (Oncology) Harriette Bouillon, MD as Consulting Physician (General Surgery) Lonie Peak, MD as Attending Physician (Radiation Oncology) Lowella Dell, MD OTHER MD:  CHIEF COMPLAINT: Estrogen receptor positive breast cancer  CURRENT TREATMENT: Neoadjuvant chemotherapy   INTERVAL HISTORY: [name] [name] returns today for follow up and treatment of her estrogen receptor positive breast cancer. Her daughter Elmarie Shiley participated by speaker phone  She continues on neoadjuvant chemotherapy, now consisting of Cyclophosphamdie and Doxorubicin. Recall she started with cytoxan/taxotere but developed neuraopathy after 2 cycles. She was then switched to [facility], with four AC cycles planned. She is receiving these every 21 days (not dose dense). Today is day 1 cycle 3 of AC (her fifth chemotherapy cycle altogether).  Her most recent echocardiogram on 04/17/2020 showed an ejection fraction of 55-60%.   REVIEW OF SYSTEMS: [name] [name] generally tolerates treatment well. She had some gum swelling she says but no ulcers. That kept her from eating normally and she may have lost a little weight, which she then regained. She has occasional mild headaches which she treats with tylenol. She is constipated. 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 She still has some numbness in her toes.   COVID 19 VACCINATION STATUS: s/p  [employer]  X2, most recently June 2021, no booster as of 05/01/2020   HISTORY OF CURRENT ILLNESS: From the original intake note:  [name] herself palpated a mass in the right axilla "a long time ago", more recently with complaints of  associated pain. She brought it to medical attention and underwent bilateral diagnostic mammography with tomography and bilateral breast ultrasonography at [facility] on 01/31/2020 showing: breast density category B; three right breast masses-- 1.9 cm at 12 o'clock, 2.3 cm at 11 o'clock, 0.4 cm at 1 o'clock; two adjacent enlarged thickened lymph nodes measuring up to 2.1 cm; no evidence of left breast malignancy.  Accordingly on 02/03/2020 she proceeded to biopsy of the right breast area at 12 o'clock and right axilla. The pathology from this procedure ([id number]) showed: invasive ductal carcinoma, grade 3. Prognostic indicators significant for: estrogen receptor, 100% positive with strong staining intensity and progesterone receptor, 0% negative. Proliferation marker Ki67 at 60%. HER2 equivocal by immunohistochemistry (2+), but negative by fluorescent in situ hybridization with a signals ratio 0.81 and number per cell 2.7.  The biopsied lymph node was found to show metastatic carcinoma. The morphology was considered different from the biopsied mass, and a prognostic panel was performed. Estrogen receptor, 100% positive with strong staining intensity and progesterone receptor, 0% negative. Proliferation marker Ki67 at 70%. HER2 equivcoal by immunohistochemistry (2+), but negative) by fluorescent in situ hybridization with a signals ratio 1.16 and number per cell 3.9.  She underwent additional right breast biopsies on 02/06/2020. Pathology [id number]) from the mass at 11 o'clock showed: invasive ductal carcinoma, grade 2. Prognostic indicators significant for: estrogen receptor, 100% positive with strong staining intensity and progesterone receptor, 0% negative. Proliferation marker Ki67 at 15%. HER2 equivocal by immunohistochemistry (2+), but negative by fluorescent in situ hybridization with a signals ratio 1.29 and number per cell 2.7.  The biopsied mass at 1 o'clock showed only fibrocystic  change.  The patient's subsequent history  is as detailed below.   PAST MEDICAL HISTORY: Past Medical History:  Diagnosis Date  . Arthritis   . Diabetes mellitus without complication (HCC)   . Hypertension     PAST SURGICAL HISTORY: Past Surgical History:  Procedure Laterality Date  . IR IMAGING GUIDED PORT INSERTION  02/23/2020    FAMILY HISTORY: Family History  Problem Relation Age of Onset  . Asthma Mother   . Arthritis Sister   . Asthma Brother   . Arthritis Brother   [name] died in his 11s from causes unknown to the patient.  [name] died from heart disease at age 74.  The patient had 4 brothers, 1 sister, with no history of cancer in the family to her knowledge   GYNECOLOGIC HISTORY:  No LMP recorded. Patient is postmenopausal. Menarche: 65 years old Age at first live birth: 65 years old GX P 1 LMP 86s Contraceptive HRT no  Hysterectomy? no BSO? no   SOCIAL HISTORY: (updated 02/2020)  [name] [name] is originally from [place].  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  She describes herself as single.  At home she lives with her daughter [name] [name] who works for [employer], her daughters husband [name] [name] who is a Insurance underwriter, and their children who are 40 months old and 44 years old.  The patient is in [place]    ADVANCED DIRECTIVES: Not in place.  The patient tells me she would intend to name her daughter Elmarie Shiley as her healthcare power of attorney   HEALTH MAINTENANCE: Social History   Tobacco Use  . Smoking status: Never Smoker  . Smokeless tobacco: Never Used  Vaping Use  . Vaping Use: Never used  Substance Use Topics  . Alcohol use: Yes    Alcohol/week: 1.0 standard drink    Types: 1 Glasses of wine per week    Comment: occasionally  . Drug use: Never     Colonoscopy: never done  PAP: 2017 (performed in [place])  Bone density:    Allergies  Allergen Reactions  . Sulfa Antibiotics Itching     Current Outpatient Medications  Medication Sig Dispense Refill  . acetaminophen (TYLENOL) 500 MG tablet Take 500 mg by mouth 3 (three) times daily as needed.    Marland Kitchen amLODipine (NORVASC) 5 MG tablet Take 5 mg by mouth daily.    Marland Kitchen atorvastatin (LIPITOR) 40 MG tablet Take 40 mg by mouth daily.    . carvedilol (COREG) 6.25 MG tablet Take 6.25 mg by mouth 2 (two) times daily with a meal.    . celecoxib (CELEBREX) 200 MG capsule Take 200 mg by mouth daily.     Marland Kitchen dexamethasone (DECADRON) 4 MG tablet Take 2 tablets (8 mg total) by mouth daily. Take daily for 3 days after chemo. Take with food. 30 tablet 1  . fluconazole (DIFLUCAN) 200 MG tablet Take 1 tablet (200 mg total) by mouth daily. (Patient not taking: Reported on 03/26/2020) 30 tablet 0  . lansoprazole (PREVACID) 30 MG capsule Take 30 mg by mouth every morning.    Marland Kitchen losartan (COZAAR) 100 MG tablet Take 100 mg by mouth daily.    . metFORMIN (GLUCOPHAGE) 1000 MG tablet Take 1,000 mg by mouth daily with breakfast.     . prochlorperazine (COMPAZINE) 10 MG tablet Take 1 tablet (10 mg total) by mouth every 6 (six) hours as needed (Nausea or vomiting). 30 tablet 1  . rivaroxaban (XARELTO) 20 MG TABS tablet Take 1  tablet (20 mg total) by mouth daily with supper. 90 tablet 2   No current facility-administered medications for this visit.    OBJECTIVE: African-American woman in no acute distress  Vitals:   06/05/20 0815  BP: 123/71  Pulse: 90  Resp: 17  Temp: (!) 97.5 F (36.4 C)  SpO2: 100%     Body mass index is 34.64 kg/m.   Wt Readings from Last 3 Encounters:  06/05/20 214 lb 9.6 oz (97.3 kg)  05/21/20 213 lb 3.2 oz (96.7 kg)  05/14/20 211 lb 6.4 oz (95.9 kg)      ECOG FS:1 - Symptomatic but completely ambulatory  Sclerae unicteric, EOMs intact Wearing a mask No cervical or supraclavicular adenopathy Lungs no rales or rhonchi Heart regular rate and rhythm Abd soft, nontender, positive bowel sounds MSK no focal spinal  tenderness, no upper extremity lymphedema Neuro: nonfocal, well oriented, appropriate affect Breasts: I do not palpate a mass in the right breast. Both axillae are benign   LAB RESULTS:  CMP     Component Value Date/Time   NA 137 05/21/2020 1150   K 4.1 05/21/2020 1150   CL 105 05/21/2020 1150   CO2 24 05/21/2020 1150   GLUCOSE 109 (H) 05/21/2020 1150   BUN 13 05/21/2020 1150   CREATININE 0.70 05/21/2020 1150   CREATININE 0.76 04/02/2020 0900   CALCIUM 8.8 (L) 05/21/2020 1150   PROT 5.8 (L) 05/21/2020 1150   ALBUMIN 3.0 (L) 05/21/2020 1150   AST 7 (L) 05/21/2020 1150   AST 10 (L) 04/02/2020 0900   ALT 10 05/21/2020 1150   ALT 14 04/02/2020 0900   ALKPHOS 66 05/21/2020 1150   BILITOT 0.6 05/21/2020 1150   BILITOT 0.6 04/02/2020 0900   GFRNONAA >60 05/21/2020 1150   GFRNONAA >60 04/02/2020 0900    No results found for: TOTALPROTELP, ALBUMINELP, A1GS, A2GS, BETS, BETA2SER, GAMS, MSPIKE, SPEI  Lab Results  Component Value Date   WBC 6.8 06/05/2020   NEUTROABS 5.6 06/05/2020   HGB 9.5 (L) 06/05/2020   HCT 29.0 (L) 06/05/2020   MCV 91.2 06/05/2020   PLT 366 06/05/2020    No results found for: LABCA2  No components found for: [id number]  No results for input(s): INR in the last 168 hours.  No results found for: LABCA2  No results found for: WUJ811  No results found for: BJY782  No results found for: NFA213  No results found for: CA2729  No components found for: HGQUANT  No results found for: CEA1 / No results found for: CEA1   No results found for: AFPTUMOR  No results found for: CHROMOGRNA  No results found for: KPAFRELGTCHN, LAMBDASER, KAPLAMBRATIO (kappa/lambda light chains)  No results found for: HGBA, HGBA2QUANT, HGBFQUANT, HGBSQUAN (Hemoglobinopathy evaluation)   No results found for: LDH  No results found for: IRON, TIBC, IRONPCTSAT (Iron and TIBC)  No results found for: FERRITIN  Urinalysis No results found for: COLORURINE,  APPEARANCEUR, LABSPEC, PHURINE, GLUCOSEU, HGBUR, BILIRUBINUR, KETONESUR, PROTEINUR, UROBILINOGEN, NITRITE, LEUKOCYTESUR   STUDIES: No results found.   ELIGIBLE FOR AVAILABLE RESEARCH PROTOCOL: AET  ASSESSMENT: 65 y.o. [place] woman status post right breast upper outer quadrant (12:00) and axillary lymph node biopsy 02/03/2020, both positive for a clinical T1c N1, stage IIB invasive ductal carcinoma, grade 3, estrogen receptor positive, HER-2 and progesterone receptor negative, with an MIB-1 of 60%.  (a) biopsy of a second right upper outer quadrant T2 lesion (11 o'clock) 02/06/2020 also showed invasive ductal carcinoma, but grade 2, estrogen  receptor positive, progesterone receptor and HER-2 negative, with an MIB-1 of 15%  (b) biopsy of a third right breast lesion (1:00) was benign   (1) neoadjuvant chemotherapy consisting of cyclophosphamide and docetaxel every 21 days x 4 starting 03/05/2020  (a) discontinued after 2 cycles because of peripheral neuropathy  (b) cyclophosphamide/doxorubicin started 04/24/2020, to be repeated every 21 days x4  (c) echo 04/17/2020 shows EF of 55-60%  (2) definitive surgery to follow  (3) adjuvant radiation  (4) antiestrogens to follow  (5) CT scan of the chest 04/23/2020 shows a left brachiocephalicl catheter associated clot  (a) rivaroxaban started 04/24/2020   PLAN: [place]ell  Will proceed to the 5th of her 6 planned chemotherapy cycles today. She is moderately anemic but not symptomatic so we are not planning to transfuse.  She will receive IVF days 3 and 5. She returns in 3 weeks for her final cycle, after which she will have her repeat breast MRI.  Total encounter time 25 min. Raymond Gurney C. Ahijah Devery, MD 06/05/20 8:17 AM Medical Oncology and Hematology [facility] [address] [place], [place] [address] Tel. [phone]    Fax. [phone]   I, Mickie Bail, am acting as scribe for Dr. Valentino Hue. Lewi Drost.  I, Ruthann Cancer MD, have reviewed the above documentation for accuracy and completeness, and I agree with the above.   *Total Encounter Time as defined by the Centers for Medicare and Medicaid Services includes, in addition to the face-to-face time of a patient visit (documented in the note above) non-face-to-face time: obtaining and reviewing outside history, ordering and reviewing medications, tests or procedures, care coordination (communications with other health care professionals or caregivers) and documentation in the medical record.

## 2020-06-05 ENCOUNTER — Encounter: Payer: Self-pay | Admitting: *Deleted

## 2020-06-05 ENCOUNTER — Inpatient Hospital Stay: Payer: Self-pay

## 2020-06-05 ENCOUNTER — Other Ambulatory Visit: Payer: Self-pay

## 2020-06-05 ENCOUNTER — Inpatient Hospital Stay (HOSPITAL_BASED_OUTPATIENT_CLINIC_OR_DEPARTMENT_OTHER): Payer: Self-pay | Admitting: Oncology

## 2020-06-05 VITALS — BP 123/71 | HR 90 | Temp 97.5°F | Resp 17 | Ht 66.0 in | Wt 214.6 lb

## 2020-06-05 DIAGNOSIS — C50411 Malignant neoplasm of upper-outer quadrant of right female breast: Secondary | ICD-10-CM

## 2020-06-05 DIAGNOSIS — Z95828 Presence of other vascular implants and grafts: Secondary | ICD-10-CM

## 2020-06-05 DIAGNOSIS — Z170 Estrogen receptor positive status [ER+]: Secondary | ICD-10-CM

## 2020-06-05 LAB — CBC WITH DIFFERENTIAL/PLATELET
Abs Immature Granulocytes: 0.09 10*3/uL — ABNORMAL HIGH (ref 0.00–0.07)
Basophils Absolute: 0 10*3/uL (ref 0.0–0.1)
Basophils Relative: 0 %
Eosinophils Absolute: 0 10*3/uL (ref 0.0–0.5)
Eosinophils Relative: 0 %
HCT: 29 % — ABNORMAL LOW (ref 36.0–46.0)
Hemoglobin: 9.5 g/dL — ABNORMAL LOW (ref 12.0–15.0)
Immature Granulocytes: 1 %
Lymphocytes Relative: 11 %
Lymphs Abs: 0.7 10*3/uL (ref 0.7–4.0)
MCH: 29.9 pg (ref 26.0–34.0)
MCHC: 32.8 g/dL (ref 30.0–36.0)
MCV: 91.2 fL (ref 80.0–100.0)
Monocytes Absolute: 0.3 10*3/uL (ref 0.1–1.0)
Monocytes Relative: 5 %
Neutro Abs: 5.6 10*3/uL (ref 1.7–7.7)
Neutrophils Relative %: 83 %
Platelets: 366 10*3/uL (ref 150–400)
RBC: 3.18 MIL/uL — ABNORMAL LOW (ref 3.87–5.11)
RDW: 16 % — ABNORMAL HIGH (ref 11.5–15.5)
WBC: 6.8 10*3/uL (ref 4.0–10.5)
nRBC: 0 % (ref 0.0–0.2)

## 2020-06-05 LAB — COMPREHENSIVE METABOLIC PANEL
ALT: 10 U/L (ref 0–44)
AST: 12 U/L — ABNORMAL LOW (ref 15–41)
Albumin: 3.3 g/dL — ABNORMAL LOW (ref 3.5–5.0)
Alkaline Phosphatase: 74 U/L (ref 38–126)
Anion gap: 13 (ref 5–15)
BUN: 9 mg/dL (ref 8–23)
CO2: 20 mmol/L — ABNORMAL LOW (ref 22–32)
Calcium: 9.7 mg/dL (ref 8.9–10.3)
Chloride: 108 mmol/L (ref 98–111)
Creatinine, Ser: 0.75 mg/dL (ref 0.44–1.00)
GFR, Estimated: >60 mL/min (ref >60)
Glucose, Bld: 210 mg/dL — ABNORMAL HIGH (ref 70–99)
Potassium: 3.7 mmol/L (ref 3.5–5.1)
Sodium: 141 mmol/L (ref 135–145)
Total Bilirubin: 0.3 mg/dL (ref 0.3–1.2)
Total Protein: 6.1 g/dL — ABNORMAL LOW (ref 6.5–8.1)

## 2020-06-05 MED ORDER — SODIUM CHLORIDE 0.9% FLUSH
10.0000 mL | INTRAVENOUS | Status: DC | PRN
Start: 2020-06-05 — End: 2020-06-05
  Administered 2020-06-05: 10 mL
  Filled 2020-06-05: qty 10

## 2020-06-05 MED ORDER — PALONOSETRON HCL INJECTION 0.25 MG/5ML
0.2500 mg | Freq: Once | INTRAVENOUS | Status: AC
  Administered 2020-06-05: 0.25 mg via INTRAVENOUS

## 2020-06-05 MED ORDER — SODIUM CHLORIDE 0.9 % IV SOLN
150.0000 mg | Freq: Once | INTRAVENOUS | Status: AC
  Administered 2020-06-05: 150 mg via INTRAVENOUS
  Filled 2020-06-05: qty 150

## 2020-06-05 MED ORDER — DOXORUBICIN HCL CHEMO IV INJECTION 2 MG/ML
50.0000 mg/m2 | Freq: Once | INTRAVENOUS | Status: AC
  Administered 2020-06-05: 108 mg via INTRAVENOUS
  Filled 2020-06-05: qty 54

## 2020-06-05 MED ORDER — HEPARIN SOD (PORK) LOCK FLUSH 100 UNIT/ML IV SOLN
500.0000 [IU] | Freq: Once | INTRAVENOUS | Status: AC | PRN
  Administered 2020-06-05: 500 [IU]
  Filled 2020-06-05: qty 5

## 2020-06-05 MED ORDER — SODIUM CHLORIDE 0.9% FLUSH
10.0000 mL | Freq: Once | INTRAVENOUS | Status: AC
  Administered 2020-06-05: 10 mL
  Filled 2020-06-05: qty 10

## 2020-06-05 MED ORDER — PALONOSETRON HCL INJECTION 0.25 MG/5ML
INTRAVENOUS | Status: AC
  Filled 2020-06-05: qty 5

## 2020-06-05 MED ORDER — CYCLOPHOSPHAMIDE CHEMO INJECTION 1 GM
500.0000 mg/m2 | Freq: Once | INTRAMUSCULAR | Status: AC
  Administered 2020-06-05: 1080 mg via INTRAVENOUS
  Filled 2020-06-05: qty 54

## 2020-06-05 MED ORDER — SODIUM CHLORIDE 0.9 % IV SOLN
10.0000 mg | Freq: Once | INTRAVENOUS | Status: AC
  Administered 2020-06-05: 10 mg via INTRAVENOUS
  Filled 2020-06-05: qty 10

## 2020-06-05 MED ORDER — SODIUM CHLORIDE 0.9 % IV SOLN
Freq: Once | INTRAVENOUS | Status: AC
  Filled 2020-06-05: qty 250

## 2020-06-05 NOTE — Patient Instructions (Signed)
[facility] Discharge Instructions for Patients Receiving Chemotherapy  Today you received the following chemotherapy agents: Adriamycin, Cytoxan  To help prevent nausea and vomiting after your treatment, we encourage you to take your nausea medication as directed.   If you develop nausea and vomiting that is not controlled by your nausea medication, call the clinic.   BELOW ARE SYMPTOMS THAT SHOULD BE REPORTED IMMEDIATELY:  *FEVER GREATER THAN 100.5 F  *CHILLS WITH OR WITHOUT FEVER  NAUSEA AND VOMITING THAT IS NOT CONTROLLED WITH YOUR NAUSEA MEDICATION  *UNUSUAL SHORTNESS OF BREATH  *UNUSUAL BRUISING OR BLEEDING  TENDERNESS IN MOUTH AND THROAT WITH OR WITHOUT PRESENCE OF ULCERS  *URINARY PROBLEMS  *BOWEL PROBLEMS  UNUSUAL RASH Items with * indicate a potential emergency and should be followed up as soon as possible.  Feel free to call the clinic should you have any questions or concerns. The clinic phone number is [phone].  Please show the CHEMO ALERT CARD at check-in to the Emergency Department and triage nurse.   

## 2020-06-06 ENCOUNTER — Telehealth: Payer: Self-pay | Admitting: Oncology

## 2020-06-06 NOTE — Telephone Encounter (Signed)
Scheduled appts per 1/25 los. Pt's daughter confirmed appt dates and times.

## 2020-06-07 ENCOUNTER — Inpatient Hospital Stay: Payer: Self-pay

## 2020-06-07 ENCOUNTER — Other Ambulatory Visit: Payer: Self-pay

## 2020-06-07 VITALS — BP 120/63 | HR 67 | Temp 98.8°F | Resp 18

## 2020-06-07 DIAGNOSIS — Z95828 Presence of other vascular implants and grafts: Secondary | ICD-10-CM

## 2020-06-07 DIAGNOSIS — C50411 Malignant neoplasm of upper-outer quadrant of right female breast: Secondary | ICD-10-CM

## 2020-06-07 MED ORDER — PEGFILGRASTIM-CBQV 6 MG/0.6ML ~~LOC~~ SOSY
PREFILLED_SYRINGE | SUBCUTANEOUS | Status: AC
  Filled 2020-06-07: qty 0.6

## 2020-06-07 MED ORDER — SODIUM CHLORIDE 0.9% FLUSH
10.0000 mL | Freq: Once | INTRAVENOUS | Status: AC
  Administered 2020-06-07: 10 mL via INTRAVENOUS
  Filled 2020-06-07: qty 10

## 2020-06-07 MED ORDER — SODIUM CHLORIDE 0.9 % IV SOLN
INTRAVENOUS | Status: DC
  Filled 2020-06-07 (×2): qty 250

## 2020-06-07 MED ORDER — HEPARIN SOD (PORK) LOCK FLUSH 100 UNIT/ML IV SOLN
500.0000 [IU] | Freq: Once | INTRAVENOUS | Status: AC
  Administered 2020-06-07: 500 [IU] via INTRAVENOUS
  Filled 2020-06-07: qty 5

## 2020-06-07 MED ORDER — PEGFILGRASTIM-CBQV 6 MG/0.6ML ~~LOC~~ SOSY
6.0000 mg | PREFILLED_SYRINGE | Freq: Once | SUBCUTANEOUS | Status: AC
  Administered 2020-06-07: 6 mg via SUBCUTANEOUS

## 2020-06-07 NOTE — Patient Instructions (Signed)
Rehydration, Adult Rehydration is the replacement of body fluids, salts, and minerals (electrolytes) that are lost during dehydration. Dehydration is when there is not enough water or other fluids in the body. This happens when you lose more fluids than you take in. Common causes of dehydration include:  Not drinking enough fluids. This can occur when you are ill or doing activities that require a lot of energy, especially in hot weather.  Conditions that cause loss of water or other fluids, such as diarrhea, vomiting, sweating, or urinating a lot.  Other illnesses, such as fever or infection.  Certain medicines, such as those that remove excess fluid from the body (diuretics). Symptoms of mild or moderate dehydration may include thirst, dry lips and mouth, and dizziness. Symptoms of severe dehydration may include increased heart rate, confusion, fainting, and not urinating. For severe dehydration, you may need to get fluids through an IV at the hospital. For mild or moderate dehydration, you can usually rehydrate at home by drinking certain fluids as told by your health care provider. What are the risks? Generally, rehydration is safe. However, taking in too much fluid (overhydration) can be a problem. This is rare. Overhydration can cause an electrolyte imbalance, kidney failure, or a decrease in salt (sodium) levels in the body. Supplies needed You will need an oral rehydration solution (ORS) if your health care provider tells you to use one. This is a drink to treat dehydration. It can be found in pharmacies and retail stores. How to rehydrate Fluids Follow instructions from your health care provider for rehydration. The kind of fluid and the amount you should drink depend on your condition. In general, you should choose drinks that you prefer.  If told by your health care provider, drink an ORS. ? Make an ORS by following instructions on the package. ? Start by drinking small amounts,  about  cup (120 mL) every 5-10 minutes. ? Slowly increase how much you drink until you have taken the amount recommended by your health care provider.  Drink enough clear fluids to keep your urine pale yellow. If you were told to drink an ORS, finish it first, then start slowly drinking other clear fluids. Drink fluids such as: ? Water. This includes sparkling water and flavored water. Drinking only water can lead to having too little sodium in your body (hyponatremia). Follow the advice of your health care provider. ? Water from ice chips you suck on. ? Fruit juice with water you add to it (diluted). ? Sports drinks. ? Hot or cold herbal teas. ? Broth-based soups. ? Milk or milk products. Food Follow instructions from your health care provider about what to eat while you rehydrate. Your health care provider may recommend that you slowly begin eating regular foods in small amounts.  Eat foods that contain a healthy balance of electrolytes, such as bananas, oranges, potatoes, tomatoes, and spinach.  Avoid foods that are greasy or contain a lot of sugar. In some cases, you may get nutrition through a feeding tube that is passed through your nose and into your stomach (nasogastric tube, or NG tube). This may be done if you have uncontrolled vomiting or diarrhea.   Beverages to avoid Certain beverages may make dehydration worse. While you rehydrate, avoid drinking alcohol.   How to tell if you are recovering from dehydration You may be recovering from dehydration if:  You are urinating more often than before you started rehydrating.  Your urine is pale yellow.  Your energy level   improves.  You vomit less frequently.  You have diarrhea less frequently.  Your appetite improves or returns to normal.  You feel less dizzy or less light-headed.  Your skin tone and color start to look more normal. Follow these instructions at home:  Take over-the-counter and prescription medicines only  as told by your health care provider.  Do not take sodium tablets. Doing this can lead to having too much sodium in your body (hypernatremia). Contact a health care provider if:  You continue to have symptoms of mild or moderate dehydration, such as: ? Thirst. ? Dry lips. ? Slightly dry mouth. ? Dizziness. ? Dark urine or less urine than normal. ? Muscle cramps.  You continue to vomit or have diarrhea. Get help right away if you:  Have symptoms of dehydration that get worse.  Have a fever.  Have a severe headache.  Have been vomiting and the following happens: ? Your vomiting gets worse or does not go away. ? Your vomit includes blood or green matter (bile). ? You cannot eat or drink without vomiting.  Have problems with urination or bowel movements, such as: ? Diarrhea that gets worse or does not go away. ? Blood in your stool (feces). This may cause stool to look black and tarry. ? Not urinating, or urinating only a small amount of very dark urine, within 6-8 hours.  Have trouble breathing.  Have symptoms that get worse with treatment. These symptoms may represent a serious problem that is an emergency. Do not wait to see if the symptoms will go away. Get medical help right away. Call your local emergency services (911 in the [place] Do not drive yourself to the hospital. Summary  Rehydration is the replacement of body fluids and minerals (electrolytes) that are lost during dehydration.  Follow instructions from your health care provider for rehydration. The kind of fluid and amount you should drink depend on your condition.  Slowly increase how much you drink until you have taken the amount recommended by your health care provider.  Contact your health care provider if you continue to show signs of mild or moderate dehydration. This information is not intended to replace advice given to you by your health care provider. Make sure you discuss any questions you have with  your health care provider. Document Revised: 06/29/2019 Document Reviewed: 05/09/2019 Elsevier Patient Education  2021 [employer]  

## 2020-06-09 ENCOUNTER — Inpatient Hospital Stay: Payer: Self-pay

## 2020-06-09 ENCOUNTER — Other Ambulatory Visit: Payer: Self-pay

## 2020-06-09 VITALS — BP 113/76 | HR 82 | Temp 98.1°F | Resp 16

## 2020-06-09 DIAGNOSIS — Z95828 Presence of other vascular implants and grafts: Secondary | ICD-10-CM

## 2020-06-09 MED ORDER — HEPARIN SOD (PORK) LOCK FLUSH 100 UNIT/ML IV SOLN
500.0000 [IU] | Freq: Once | INTRAVENOUS | Status: AC
  Administered 2020-06-09: 500 [IU] via INTRAVENOUS
  Filled 2020-06-09: qty 5

## 2020-06-09 MED ORDER — SODIUM CHLORIDE 0.9 % IV SOLN
INTRAVENOUS | Status: DC
  Filled 2020-06-09 (×2): qty 250

## 2020-06-09 MED ORDER — SODIUM CHLORIDE 0.9% FLUSH
10.0000 mL | INTRAVENOUS | Status: DC | PRN
  Administered 2020-06-09: 10 mL via INTRAVENOUS
  Filled 2020-06-09: qty 10

## 2020-06-11 ENCOUNTER — Encounter: Payer: Self-pay | Admitting: *Deleted

## 2020-06-13 MED FILL — XARELTO 20 MG TABLET: 20 | 30 days supply | Qty: 30 | Fill #1

## 2020-06-24 NOTE — Progress Notes (Signed)
[name] [name]  Telephone:([phone]) [phone] Fax:(336) [phone]     ID: [name] DOB: 22-Feb-1956  MR#: [id number]  JFH#:[id number]  Patient Care Team: Kristie Cowman, MD as PCP - General (Family Medicine) Mauro Kaufmann, RN as Oncology Nurse Navigator Rockwell Germany, RN as Oncology Nurse Navigator Rhegan Trunnell, Virgie Dad, MD as Consulting Physician (Oncology) Erroll Luna, MD as Consulting Physician (General Surgery) Eppie Gibson, MD as Attending Physician (Radiation Oncology) Chauncey Cruel, MD OTHER MD:  CHIEF COMPLAINT: Estrogen receptor positive breast cancer  CURRENT TREATMENT: Neoadjuvant chemotherapy   INTERVAL HISTORY: [name] returns today for follow up and treatment of her estrogen receptor positive breast cancer.  She completes her chemotherapy today.  She initially received cytoxan/taxotere but developed neuraopathy after 2 cycles. She was then switched to [facility], with four AC cycles planned. She is receiving these every 21 days (not dose dense). Today is day 1 cycle 4 of AC (her seventh chemotherapy cycle altogether).  Her most recent echocardiogram on 04/17/2020 showed an ejection fraction of 55-60%.  She is scheduled for breast MRI on 07/02/2020.   REVIEW OF SYSTEMS: [name] tells me she still has grade 1 neuropathy in her fingers and toes but it is getting slightly better.  If she rubs it with alcohol it goes away for a while.  She has a little bit of discomfort and she wants to take Tylenol.  She understands it is okay for her to do that even though she is on rivaroxaban.  She has had no bleeding or bruising.  Her port is working well.  And a detailed review of systems today was otherwise stable.   COVID 19 VACCINATION STATUS: s/p  [employer]  X2, most recently June 2021, no booster as of 05/01/2020   HISTORY OF CURRENT ILLNESS: From the original intake note:  [name] [name] herself palpated a mass in the right axilla "a long time ago", more recently with  complaints of associated pain. She brought it to medical attention and underwent bilateral diagnostic mammography with tomography and bilateral breast ultrasonography at [facility] on 01/31/2020 showing: breast density category B; three right breast masses-- 1.9 cm at 12 o'clock, 2.3 cm at 11 o'clock, 0.4 cm at 1 o'clock; two adjacent enlarged thickened lymph nodes measuring up to 2.1 cm; no evidence of left breast malignancy.  Accordingly on 02/03/2020 she proceeded to biopsy of the right breast area at 12 o'clock and right axilla. The pathology from this procedure ([id number]) showed: invasive ductal carcinoma, grade 3. Prognostic indicators significant for: estrogen receptor, 100% positive with strong staining intensity and progesterone receptor, 0% negative. Proliferation marker Ki67 at 60%. HER2 equivocal by immunohistochemistry (2+), but negative by fluorescent in situ hybridization with a signals ratio 0.81 and number per cell 2.7.  The biopsied lymph node was found to show metastatic carcinoma. The morphology was considered different from the biopsied mass, and a prognostic panel was performed. Estrogen receptor, 100% positive with strong staining intensity and progesterone receptor, 0% negative. Proliferation marker Ki67 at 70%. HER2 equivcoal by immunohistochemistry (2+), but negative) by fluorescent in situ hybridization with a signals ratio 1.16 and number per cell 3.9.  She underwent additional right breast biopsies on 02/06/2020. Pathology [id number]) from the mass at 11 o'clock showed: invasive ductal carcinoma, grade 2. Prognostic indicators significant for: estrogen receptor, 100% positive with strong staining intensity and progesterone receptor, 0% negative. Proliferation marker Ki67 at 15%. HER2 equivocal by immunohistochemistry (2+), but negative by fluorescent in situ hybridization with a signals  ratio 1.29 and number per cell 2.7.  The biopsied mass at 1 o'clock showed only  fibrocystic change.  The patient's subsequent history is as detailed below.   PAST MEDICAL HISTORY: Past Medical History:  Diagnosis Date   Arthritis    Diabetes mellitus without complication ([place])    Hypertension     PAST SURGICAL HISTORY: Past Surgical History:  Procedure Laterality Date   IR IMAGING GUIDED PORT INSERTION  02/23/2020    FAMILY HISTORY: Family History  Problem Relation Age of Onset   Asthma Mother    Arthritis Sister    Asthma Brother    Arthritis Brother   [name] died in his 54s from causes unknown to the patient.  [name] died from heart disease at age 63.  The patient had 4 brothers, 1 sister, with no history of cancer in the family to her knowledge   GYNECOLOGIC HISTORY:  No LMP recorded. Patient is postmenopausal. Menarche: 65 years old Age at first live birth: 65 years old [name] 1 LMP 53s Contraceptive HRT no  Hysterectomy? no BSO? no   SOCIAL HISTORY: (updated 02/2020)  Chanice is originally from [place].  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  She describes herself as single.  At home she lives with her daughter [name] [name] who works for [employer], her daughters husband [name] who is a English as a second language teacher, and their children who are 52 months old and 91 years old.  The patient is in [place]: Not in place.  The patient tells me she would intend to name her daughter [name] [name] as her healthcare power of attorney   HEALTH MAINTENANCE: Social History   Tobacco Use   Smoking status: Never Smoker   Smokeless tobacco: Never Used  Vaping Use   Vaping Use: Never used  Substance Use Topics   Alcohol use: Yes    Alcohol/week: 1.0 standard drink    Types: 1 Glasses of wine per week    Comment: occasionally   Drug use: Never     Colonoscopy: never done  PAP: 2017 (performed in [place])  Bone density:    Allergies  Allergen Reactions   Sulfa Antibiotics Itching     Current Outpatient Medications  Medication Sig Dispense Refill   acetaminophen (TYLENOL) 500 MG tablet Take 500 mg by mouth 3 (three) times daily as needed.     amLODipine (NORVASC) 5 MG tablet Take 5 mg by mouth daily.     atorvastatin (LIPITOR) 40 MG tablet Take 40 mg by mouth daily.     carvedilol (COREG) 6.25 MG tablet Take 6.25 mg by mouth 2 (two) times daily with a meal.     celecoxib (CELEBREX) 200 MG capsule Take 200 mg by mouth daily.      dexamethasone (DECADRON) 4 MG tablet Take 2 tablets (8 mg total) by mouth daily. Take daily for 3 days after chemo. Take with food. 30 tablet 1   fluconazole (DIFLUCAN) 200 MG tablet Take 1 tablet (200 mg total) by mouth daily. (Patient not taking: Reported on 03/26/2020) 30 tablet 0   lansoprazole (PREVACID) 30 MG capsule Take 30 mg by mouth every morning.     losartan (COZAAR) 100 MG tablet Take 100 mg by mouth daily.     metFORMIN (GLUCOPHAGE) 1000 MG tablet Take 1,000 mg by mouth daily with breakfast.      prochlorperazine (COMPAZINE) 10 MG tablet Take 1 tablet (10 mg total)  by mouth every 6 (six) hours as needed (Nausea or vomiting). 30 tablet 1   rivaroxaban (XARELTO) 20 MG TABS tablet Take 1 tablet (20 mg total) by mouth daily with supper. 90 tablet 2   No current facility-administered medications for this visit.    OBJECTIVE: African-American woman in no acute distress  Vitals:   06/25/20 1253  BP: 135/71  Pulse: 90  Resp: 16  Temp: (!) 97.5 F (36.4 C)  SpO2: 100%     Body mass index is 34.51 kg/m.   Wt Readings from Last 3 Encounters:  06/25/20 213 lb 12.8 oz (97 kg)  06/05/20 214 lb 9.6 oz (97.3 kg)  05/21/20 213 lb 3.2 oz (96.7 kg)      ECOG FS:1 - Symptomatic but completely ambulatory  Sclerae unicteric, EOMs intact Wearing a mask No cervical or supraclavicular adenopathy Lungs no rales or rhonchi Heart regular rate and rhythm Abd soft, nontender, positive bowel sounds MSK no focal spinal  tenderness, no upper extremity lymphedema Neuro: nonfocal, well oriented, appropriate affect Breasts: I do not palpate a mass in the right breast or right axilla.  The left breast and left axilla are benign   LAB RESULTS:  CMP     Component Value Date/Time   NA 141 06/05/2020 0802   K 3.7 06/05/2020 0802   CL 108 06/05/2020 0802   CO2 20 (L) 06/05/2020 0802   GLUCOSE 210 (H) 06/05/2020 0802   BUN 9 06/05/2020 0802   CREATININE 0.75 06/05/2020 0802   CREATININE 0.76 04/02/2020 0900   CALCIUM 9.7 06/05/2020 0802   PROT 6.1 (L) 06/05/2020 0802   ALBUMIN 3.3 (L) 06/05/2020 0802   AST 12 (L) 06/05/2020 0802   AST 10 (L) 04/02/2020 0900   ALT 10 06/05/2020 0802   ALT 14 04/02/2020 0900   ALKPHOS 74 06/05/2020 0802   BILITOT 0.3 06/05/2020 0802   BILITOT 0.6 04/02/2020 0900   GFRNONAA >60 06/05/2020 0802   GFRNONAA >60 04/02/2020 0900    No results found for: TOTALPROTELP, ALBUMINELP, A1GS, A2GS, BETS, BETA2SER, GAMS, MSPIKE, SPEI  Lab Results  Component Value Date   WBC 9.3 06/25/2020   NEUTROABS 7.1 06/25/2020   HGB 9.5 (L) 06/25/2020   HCT 29.0 (L) 06/25/2020   MCV 94.8 06/25/2020   PLT 365 06/25/2020    No results found for: LABCA2  No components found for: RXVQMG867  No results for input(s): INR in the last 168 hours.  No results found for: LABCA2  No results found for: YPP509  No results found for: TOI712  No results found for: WPY099  No results found for: CA2729  No components found for: HGQUANT  No results found for: CEA1 / No results found for: CEA1   No results found for: AFPTUMOR  No results found for: CHROMOGRNA  No results found for: KPAFRELGTCHN, LAMBDASER, KAPLAMBRATIO (kappa/lambda light chains)  No results found for: HGBA, HGBA2QUANT, HGBFQUANT, HGBSQUAN (Hemoglobinopathy evaluation)   No results found for: LDH  No results found for: IRON, TIBC, IRONPCTSAT (Iron and TIBC)  No results found for: FERRITIN  Urinalysis No  results found for: COLORURINE, APPEARANCEUR, LABSPEC, PHURINE, GLUCOSEU, HGBUR, BILIRUBINUR, KETONESUR, PROTEINUR, UROBILINOGEN, NITRITE, LEUKOCYTESUR   STUDIES: No results found.   ELIGIBLE FOR AVAILABLE RESEARCH PROTOCOL: AET  ASSESSMENT: 65 y.o. [place] woman status post right breast upper outer quadrant (12:00) and axillary lymph node biopsy 02/03/2020, both positive for a clinical T1c N1, stage IIB invasive ductal carcinoma, grade 3, estrogen receptor positive, HER-2 and  progesterone receptor negative, with an MIB-1 of 60%.  (a) biopsy of a second right upper outer quadrant T2 lesion (11 o'clock) 02/06/2020 also showed invasive ductal carcinoma, but grade 2, estrogen receptor positive, progesterone receptor and HER-2 negative, with an MIB-1 of 15%  (b) biopsy of a third right breast lesion (1:00) was benign   (1) neoadjuvant chemotherapy consisting of cyclophosphamide and docetaxel every 21 days x 4 starting 03/05/2020  (a) discontinued after 2 cycles because of peripheral neuropathy  (b) cyclophosphamide/doxorubicin started 04/24/2020, to be repeated every 21 days x4  (c) echo 04/17/2020 shows EF of 55-60%  (2) definitive surgery to follow  (3) adjuvant radiation  (4) antiestrogens to follow  (5) CT scan of the chest 04/23/2020 shows a left brachiocephalicl catheter associated clot  (a) rivaroxaban started 04/24/2020   PLAN: Breasia has tolerated her chemotherapy remarkably well and she completes her treatment today.  Clinically she has had a wonderful response and I do not palpate a mass in the right breast or right axilla.  She is scheduled for MRI of the breast 07/02/2020 and I am hoping for good news there.  Alerted Dr. Brantley Stage asked to her MRI.  I expect she will be having surgery early March.  Of course she will need to go off the rivaroxaban usually with the last dose 2 days before the planned surgery.  We will continue the rivaroxaban postop for at least a month after  her port is removed--I anticipate the port will be removed at the time of surgery  I am going to have her return to see [place] in early March just to review where things are and then after surgery of course she will need adjuvant radiation and then antiestrogens  She knows to call for any other issue that may develop before then  Total encounter time 30 minutes.Sarajane Jews C. Burma Ketcher, MD 06/25/20 1:02 PM Medical Oncology and Hematology [facility] [place], [place] [address] Tel. [phone]    Fax. [phone]   I, Wilburn Mylar, am acting as scribe for Dr. Virgie Dad. Rylann Munford.  I, Lurline Del MD, have reviewed the above documentation for accuracy and completeness, and I agree with the above.   *Total Encounter Time as defined by the Centers for Medicare and Medicaid Services includes, in addition to the face-to-face time of a patient visit (documented in the note above) non-face-to-face time: obtaining and reviewing outside history, ordering and reviewing medications, tests or procedures, care coordination (communications with other health care professionals or caregivers) and documentation in the medical record.

## 2020-06-25 ENCOUNTER — Inpatient Hospital Stay: Payer: Self-pay

## 2020-06-25 ENCOUNTER — Inpatient Hospital Stay: Payer: Self-pay | Attending: Oncology | Admitting: Oncology

## 2020-06-25 ENCOUNTER — Other Ambulatory Visit: Payer: Self-pay

## 2020-06-25 ENCOUNTER — Encounter: Payer: Self-pay | Admitting: *Deleted

## 2020-06-25 VITALS — BP 135/71 | HR 90 | Temp 97.5°F | Resp 16 | Ht 66.0 in | Wt 213.8 lb

## 2020-06-25 DIAGNOSIS — Z170 Estrogen receptor positive status [ER+]: Secondary | ICD-10-CM

## 2020-06-25 DIAGNOSIS — Z79899 Other long term (current) drug therapy: Secondary | ICD-10-CM | POA: Insufficient documentation

## 2020-06-25 DIAGNOSIS — C50411 Malignant neoplasm of upper-outer quadrant of right female breast: Secondary | ICD-10-CM | POA: Insufficient documentation

## 2020-06-25 DIAGNOSIS — Z95828 Presence of other vascular implants and grafts: Secondary | ICD-10-CM

## 2020-06-25 DIAGNOSIS — Z5111 Encounter for antineoplastic chemotherapy: Secondary | ICD-10-CM | POA: Insufficient documentation

## 2020-06-25 LAB — COMPREHENSIVE METABOLIC PANEL
ALT: 13 U/L (ref 0–44)
AST: 19 U/L (ref 15–41)
Albumin: 3.9 g/dL (ref 3.5–5.0)
Alkaline Phosphatase: 59 U/L (ref 38–126)
Anion gap: 7 (ref 5–15)
BUN: 7 mg/dL — ABNORMAL LOW (ref 8–23)
CO2: 22 mmol/L (ref 22–32)
Calcium: 9.3 mg/dL (ref 8.9–10.3)
Chloride: 107 mmol/L (ref 98–111)
Creatinine, Ser: 0.73 mg/dL (ref 0.44–1.00)
GFR, Estimated: >60 mL/min (ref >60)
Glucose, Bld: 110 mg/dL — ABNORMAL HIGH (ref 70–99)
Potassium: 3.9 mmol/L (ref 3.5–5.1)
Sodium: 136 mmol/L (ref 135–145)
Total Bilirubin: 0.4 mg/dL (ref 0.3–1.2)
Total Protein: 6.2 g/dL — ABNORMAL LOW (ref 6.5–8.1)

## 2020-06-25 LAB — CBC WITH DIFFERENTIAL/PLATELET
Abs Immature Granulocytes: 0.06 10*3/uL (ref 0.00–0.07)
Basophils Absolute: 0 10*3/uL (ref 0.0–0.1)
Basophils Relative: 0 %
Eosinophils Absolute: 0 10*3/uL (ref 0.0–0.5)
Eosinophils Relative: 0 %
HCT: 29 % — ABNORMAL LOW (ref 36.0–46.0)
Hemoglobin: 9.5 g/dL — ABNORMAL LOW (ref 12.0–15.0)
Immature Granulocytes: 1 %
Lymphocytes Relative: 11 %
Lymphs Abs: 1 10*3/uL (ref 0.7–4.0)
MCH: 31 pg (ref 26.0–34.0)
MCHC: 32.8 g/dL (ref 30.0–36.0)
MCV: 94.8 fL (ref 80.0–100.0)
Monocytes Absolute: 1.1 10*3/uL — ABNORMAL HIGH (ref 0.1–1.0)
Monocytes Relative: 12 %
Neutro Abs: 7.1 10*3/uL (ref 1.7–7.7)
Neutrophils Relative %: 76 %
Platelets: 365 10*3/uL (ref 150–400)
RBC: 3.06 MIL/uL — ABNORMAL LOW (ref 3.87–5.11)
RDW: 19.2 % — ABNORMAL HIGH (ref 11.5–15.5)
WBC: 9.3 10*3/uL (ref 4.0–10.5)
nRBC: 0 % (ref 0.0–0.2)

## 2020-06-25 MED ORDER — PALONOSETRON HCL INJECTION 0.25 MG/5ML
INTRAVENOUS | Status: AC
  Filled 2020-06-25: qty 5

## 2020-06-25 MED ORDER — DOXORUBICIN HCL CHEMO IV INJECTION 2 MG/ML
50.0000 mg/m2 | Freq: Once | INTRAVENOUS | Status: AC
  Administered 2020-06-25: 108 mg via INTRAVENOUS
  Filled 2020-06-25: qty 54

## 2020-06-25 MED ORDER — SODIUM CHLORIDE 0.9 % IV SOLN
150.0000 mg | Freq: Once | INTRAVENOUS | Status: AC
  Administered 2020-06-25: 150 mg via INTRAVENOUS
  Filled 2020-06-25: qty 150

## 2020-06-25 MED ORDER — CYCLOPHOSPHAMIDE CHEMO INJECTION 1 GM
500.0000 mg/m2 | Freq: Once | INTRAMUSCULAR | Status: AC
  Administered 2020-06-25: 1080 mg via INTRAVENOUS
  Filled 2020-06-25: qty 54

## 2020-06-25 MED ORDER — SODIUM CHLORIDE 0.9 % IV SOLN
10.0000 mg | Freq: Once | INTRAVENOUS | Status: AC
  Administered 2020-06-25: 10 mg via INTRAVENOUS
  Filled 2020-06-25: qty 10

## 2020-06-25 MED ORDER — RIVAROXABAN 20 MG PO TABS: 20.0000 mg | ORAL_TABLET | Freq: Every day | ORAL | 2 refills | Status: DC

## 2020-06-25 MED ORDER — SODIUM CHLORIDE 0.9 % IV SOLN
INTRAVENOUS | Status: DC
  Filled 2020-06-25: qty 250

## 2020-06-25 MED ORDER — SODIUM CHLORIDE 0.9% FLUSH
10.0000 mL | INTRAVENOUS | Status: DC | PRN
  Administered 2020-06-25: 10 mL
  Filled 2020-06-25: qty 10

## 2020-06-25 MED ORDER — SODIUM CHLORIDE 0.9 % IV SOLN
Freq: Once | INTRAVENOUS | Status: AC
  Filled 2020-06-25: qty 250

## 2020-06-25 MED ORDER — HEPARIN SOD (PORK) LOCK FLUSH 100 UNIT/ML IV SOLN
500.0000 [IU] | Freq: Once | INTRAVENOUS | Status: AC | PRN
  Administered 2020-06-25: 500 [IU]
  Filled 2020-06-25: qty 5

## 2020-06-25 MED ORDER — SODIUM CHLORIDE 0.9% FLUSH
10.0000 mL | INTRAVENOUS | Status: DC | PRN
  Administered 2020-06-25: 10 mL via INTRAVENOUS
  Filled 2020-06-25: qty 10

## 2020-06-25 MED ORDER — PALONOSETRON HCL INJECTION 0.25 MG/5ML
0.2500 mg | Freq: Once | INTRAVENOUS | Status: AC
  Administered 2020-06-25: 0.25 mg via INTRAVENOUS

## 2020-06-25 NOTE — Patient Instructions (Signed)
Implanted Port Insertion, Care After This sheet gives you information about how to care for yourself after your procedure. Your health care provider may also give you more specific instructions. If you have problems or questions, contact your health care provider. What can I expect after the procedure? After the procedure, it is common to have:  Discomfort at the port insertion site.  Bruising on the skin over the port. This should improve over 3-4 days. Follow these instructions at home: Port care  After your port is placed, you will get a manufacturer's information card. The card has information about your port. Keep this card with you at all times.  Take care of the port as told by your health care provider. Ask your health care provider if you or a family member can get training for taking care of the port at home. A home health care nurse may also take care of the port.  Make sure to remember what type of port you have. Incision care  Follow instructions from your health care provider about how to take care of your port insertion site. Make sure you: ? Wash your hands with soap and water before and after you change your bandage (dressing). If soap and water are not available, use hand sanitizer. ? Change your dressing as told by your health care provider. ? Leave stitches (sutures), skin glue, or adhesive strips in place. These skin closures may need to stay in place for 2 weeks or longer. If adhesive strip edges start to loosen and curl up, you may trim the loose edges. Do not remove adhesive strips completely unless your health care provider tells you to do that.  Check your port insertion site every day for signs of infection. Check for: ? Redness, swelling, or pain. ? Fluid or blood. ? Warmth. ? Pus or a bad smell.      Activity  Return to your normal activities as told by your health care provider. Ask your health care provider what activities are safe for you.  Do not  lift anything that is heavier than 10 lb (4.5 kg), or the limit that you are told, until your health care provider says that it is safe. General instructions  Take over-the-counter and prescription medicines only as told by your health care provider.  Do not take baths, swim, or use a hot tub until your health care provider approves. Ask your health care provider if you may take showers. You may only be allowed to take sponge baths.  Do not drive for 24 hours if you were given a sedative during your procedure.  Wear a medical alert bracelet in case of an emergency. This will tell any health care providers that you have a port.  Keep all follow-up visits as told by your health care provider. This is important. Contact a health care provider if:  You cannot flush your port with saline as directed, or you cannot draw blood from the port.  You have a fever or chills.  You have redness, swelling, or pain around your port insertion site.  You have fluid or blood coming from your port insertion site.  Your port insertion site feels warm to the touch.  You have pus or a bad smell coming from the port insertion site. Get help right away if:  You have chest pain or shortness of breath.  You have bleeding from your port that you cannot control. Summary  Take care of the port as told by your   health care provider. Keep the manufacturer's information card with you at all times.  Change your dressing as told by your health care provider.  Contact a health care provider if you have a fever or chills or if you have redness, swelling, or pain around your port insertion site.  Keep all follow-up visits as told by your health care provider. This information is not intended to replace advice given to you by your health care provider. Make sure you discuss any questions you have with your health care provider. Document Revised: 11/24/2017 Document Reviewed: 11/24/2017 Elsevier Patient Education   2021 Elsevier Inc.  

## 2020-06-25 NOTE — Progress Notes (Signed)
Per Dr Jana Hakim pt ok to treat today with HGB: 9.5 and HCT: 29.0

## 2020-06-25 NOTE — Patient Instructions (Signed)
[facility] Discharge Instructions for Patients Receiving Chemotherapy  Today you received the following chemotherapy agents: doxorubicin/cyclophosphamide.  To help prevent nausea and vomiting after your treatment, we encourage you to take your nausea medication as directed.   If you develop nausea and vomiting that is not controlled by your nausea medication, call the clinic.   BELOW ARE SYMPTOMS THAT SHOULD BE REPORTED IMMEDIATELY:  *FEVER GREATER THAN 100.5 F  *CHILLS WITH OR WITHOUT FEVER  NAUSEA AND VOMITING THAT IS NOT CONTROLLED WITH YOUR NAUSEA MEDICATION  *UNUSUAL SHORTNESS OF BREATH  *UNUSUAL BRUISING OR BLEEDING  TENDERNESS IN MOUTH AND THROAT WITH OR WITHOUT PRESENCE OF ULCERS  *URINARY PROBLEMS  *BOWEL PROBLEMS  UNUSUAL RASH Items with * indicate a potential emergency and should be followed up as soon as possible.  Feel free to call the clinic should you have any questions or concerns. The clinic phone number is [phone].  Please show the CHEMO ALERT CARD at check-in to the Emergency Department and triage nurse.   

## 2020-06-26 ENCOUNTER — Telehealth: Payer: Self-pay | Admitting: Oncology

## 2020-06-26 NOTE — Telephone Encounter (Signed)
Scheduled appts per 2/14 los. Pt's daughter confirmed appt date and time.

## 2020-06-27 ENCOUNTER — Other Ambulatory Visit: Payer: Self-pay

## 2020-06-27 ENCOUNTER — Inpatient Hospital Stay: Payer: Self-pay

## 2020-06-27 VITALS — BP 93/62 | HR 78 | Temp 98.4°F | Resp 18

## 2020-06-27 DIAGNOSIS — Z95828 Presence of other vascular implants and grafts: Secondary | ICD-10-CM

## 2020-06-27 DIAGNOSIS — Z170 Estrogen receptor positive status [ER+]: Secondary | ICD-10-CM

## 2020-06-27 DIAGNOSIS — C50411 Malignant neoplasm of upper-outer quadrant of right female breast: Secondary | ICD-10-CM

## 2020-06-27 MED ORDER — SODIUM CHLORIDE 0.9 % IV SOLN
INTRAVENOUS | Status: DC
  Filled 2020-06-27 (×2): qty 250

## 2020-06-27 MED ORDER — PEGFILGRASTIM-CBQV 6 MG/0.6ML ~~LOC~~ SOSY
6.0000 mg | PREFILLED_SYRINGE | Freq: Once | SUBCUTANEOUS | Status: AC
  Administered 2020-06-27: 6 mg via SUBCUTANEOUS

## 2020-06-27 MED ORDER — PEGFILGRASTIM-CBQV 6 MG/0.6ML ~~LOC~~ SOSY
PREFILLED_SYRINGE | SUBCUTANEOUS | Status: AC
  Filled 2020-06-27: qty 0.6

## 2020-06-27 NOTE — Patient Instructions (Signed)
Rehydration, Adult Rehydration is the replacement of body fluids, salts, and minerals (electrolytes) that are lost during dehydration. Dehydration is when there is not enough water or other fluids in the body. This happens when you lose more fluids than you take in. Common causes of dehydration include:  Not drinking enough fluids. This can occur when you are ill or doing activities that require a lot of energy, especially in hot weather.  Conditions that cause loss of water or other fluids, such as diarrhea, vomiting, sweating, or urinating a lot.  Other illnesses, such as fever or infection.  Certain medicines, such as those that remove excess fluid from the body (diuretics). Symptoms of mild or moderate dehydration may include thirst, dry lips and mouth, and dizziness. Symptoms of severe dehydration may include increased heart rate, confusion, fainting, and not urinating. For severe dehydration, you may need to get fluids through an IV at the hospital. For mild or moderate dehydration, you can usually rehydrate at home by drinking certain fluids as told by your health care provider. What are the risks? Generally, rehydration is safe. However, taking in too much fluid (overhydration) can be a problem. This is rare. Overhydration can cause an electrolyte imbalance, kidney failure, or a decrease in salt (sodium) levels in the body. Supplies needed You will need an oral rehydration solution (ORS) if your health care provider tells you to use one. This is a drink to treat dehydration. It can be found in pharmacies and retail stores. How to rehydrate Fluids Follow instructions from your health care provider for rehydration. The kind of fluid and the amount you should drink depend on your condition. In general, you should choose drinks that you prefer.  If told by your health care provider, drink an ORS. ? Make an ORS by following instructions on the package. ? Start by drinking small amounts,  about  cup (120 mL) every 5-10 minutes. ? Slowly increase how much you drink until you have taken the amount recommended by your health care provider.  Drink enough clear fluids to keep your urine pale yellow. If you were told to drink an ORS, finish it first, then start slowly drinking other clear fluids. Drink fluids such as: ? Water. This includes sparkling water and flavored water. Drinking only water can lead to having too little sodium in your body (hyponatremia). Follow the advice of your health care provider. ? Water from ice chips you suck on. ? Fruit juice with water you add to it (diluted). ? Sports drinks. ? Hot or cold herbal teas. ? Broth-based soups. ? Milk or milk products. Food Follow instructions from your health care provider about what to eat while you rehydrate. Your health care provider may recommend that you slowly begin eating regular foods in small amounts.  Eat foods that contain a healthy balance of electrolytes, such as bananas, oranges, potatoes, tomatoes, and spinach.  Avoid foods that are greasy or contain a lot of sugar. In some cases, you may get nutrition through a feeding tube that is passed through your nose and into your stomach (nasogastric tube, or NG tube). This may be done if you have uncontrolled vomiting or diarrhea.   Beverages to avoid Certain beverages may make dehydration worse. While you rehydrate, avoid drinking alcohol.   How to tell if you are recovering from dehydration You may be recovering from dehydration if:  You are urinating more often than before you started rehydrating.  Your urine is pale yellow.  Your energy level   improves.  You vomit less frequently.  You have diarrhea less frequently.  Your appetite improves or returns to normal.  You feel less dizzy or less light-headed.  Your skin tone and color start to look more normal. Follow these instructions at home:  Take over-the-counter and prescription medicines only  as told by your health care provider.  Do not take sodium tablets. Doing this can lead to having too much sodium in your body (hypernatremia). Contact a health care provider if:  You continue to have symptoms of mild or moderate dehydration, such as: ? Thirst. ? Dry lips. ? Slightly dry mouth. ? Dizziness. ? Dark urine or less urine than normal. ? Muscle cramps.  You continue to vomit or have diarrhea. Get help right away if you:  Have symptoms of dehydration that get worse.  Have a fever.  Have a severe headache.  Have been vomiting and the following happens: ? Your vomiting gets worse or does not go away. ? Your vomit includes blood or green matter (bile). ? You cannot eat or drink without vomiting.  Have problems with urination or bowel movements, such as: ? Diarrhea that gets worse or does not go away. ? Blood in your stool (feces). This may cause stool to look black and tarry. ? Not urinating, or urinating only a small amount of very dark urine, within 6-8 hours.  Have trouble breathing.  Have symptoms that get worse with treatment. These symptoms may represent a serious problem that is an emergency. Do not wait to see if the symptoms will go away. Get medical help right away. Call your local emergency services (911 in the [place] Do not drive yourself to the hospital. Summary  Rehydration is the replacement of body fluids and minerals (electrolytes) that are lost during dehydration.  Follow instructions from your health care provider for rehydration. The kind of fluid and amount you should drink depend on your condition.  Slowly increase how much you drink until you have taken the amount recommended by your health care provider.  Contact your health care provider if you continue to show signs of mild or moderate dehydration. This information is not intended to replace advice given to you by your health care provider. Make sure you discuss any questions you have with  your health care provider. Document Revised: 06/29/2019 Document Reviewed: 05/09/2019 Elsevier Patient Education  2021 [employer]  

## 2020-06-29 ENCOUNTER — Other Ambulatory Visit: Payer: Self-pay

## 2020-06-29 ENCOUNTER — Inpatient Hospital Stay: Payer: Self-pay

## 2020-06-29 VITALS — BP 109/67 | HR 88 | Temp 99.3°F | Resp 18

## 2020-06-29 DIAGNOSIS — Z95828 Presence of other vascular implants and grafts: Secondary | ICD-10-CM

## 2020-06-29 MED ORDER — SODIUM CHLORIDE 0.9 % IV SOLN
INTRAVENOUS | Status: DC
  Filled 2020-06-29 (×2): qty 250

## 2020-06-29 NOTE — Patient Instructions (Signed)

## 2020-07-02 ENCOUNTER — Ambulatory Visit
Admission: RE | Admit: 2020-07-02 | Discharge: 2020-07-02 | Disposition: A | Discharge status: Home / Self Care | Payer: No Typology Code available for payment source | Source: Ambulatory Visit | Attending: Surgery | Admitting: Surgery

## 2020-07-02 DIAGNOSIS — C50411 Malignant neoplasm of upper-outer quadrant of right female breast: Secondary | ICD-10-CM

## 2020-07-02 DIAGNOSIS — Z170 Estrogen receptor positive status [ER+]: Secondary | ICD-10-CM

## 2020-07-02 IMAGING — MR MR BREAST BILAT WO/W CM
8 of 12 series · 32 of 48 positions shown · IV contrast (gadavist)
Comparison: No prior MRI available for comparison. Correlation made
with prior mammogram and ultrasound images.

CLINICAL DATA: 64-year-old female diagnosed with grade 3 invasive
ductal carcinoma in the right breast at 12 o'clock (ribbon clip),
and in the right breast at 11 o'clock (coil clip) as well as a
metastatic right axillary lymph node. She had an additional biopsy
of a site in the right breast at 1 o'clock, 8 cm from the nipple
(heart clip) which was benign fibrocystic change.

LABS:  Creatinine of 0.73 mg/dL and GFR of greater than 60 on
[DATE].
EXAM:
BILATERAL BREAST MRI WITH AND WITHOUT CONTRAST
TECHNIQUE: Multiplanar, multisequence MR images of both breasts were obtained
prior to and following the intravenous administration of 10 ml of
Gadavist

[Series 2: t2_tirm_tra ipat (a-p) · axial · 3.0mm · 0.78mm/px · 1 of 61 slices shown]
[im 1/61]
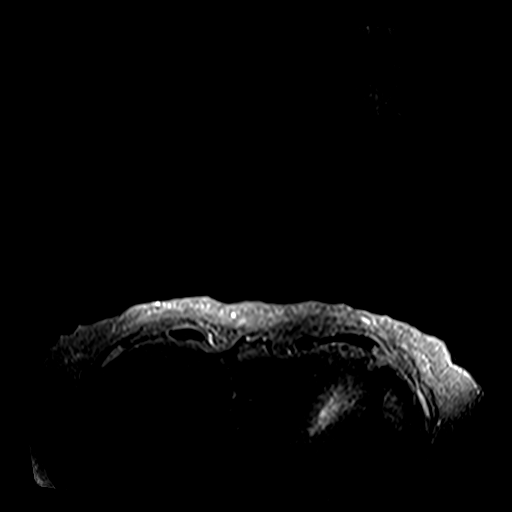

[Series 3: fl3d pre-cm no · axial · non-contrast · 1.2mm · 1.04mm/px · z∈[-132,+59]mm · 5 of 160 slices shown]
[im 1/160]
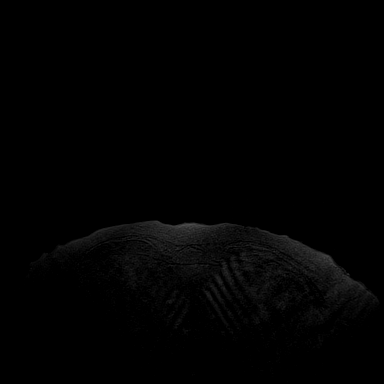
[im 40/160]
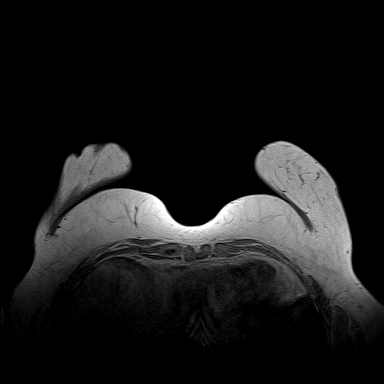
[im 80/160]
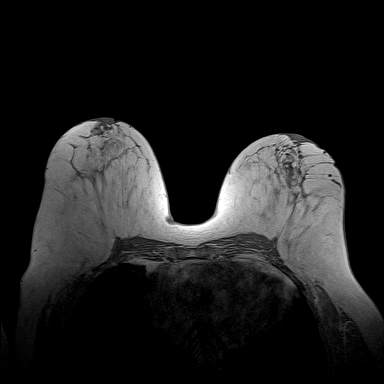
[im 120/160]
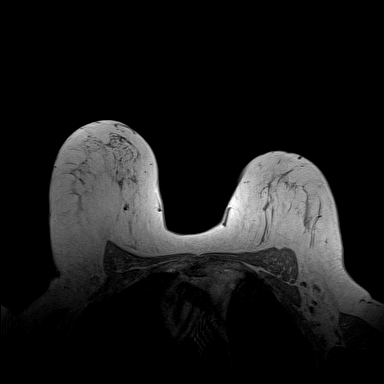
[im 160/160]
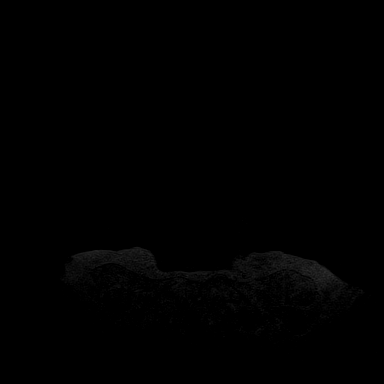

[Series 4: fl3d pre-cm · axial · non-contrast · 1.2mm · 1.04mm/px · z∈[-132,+59]mm · 5 of 160 slices shown]
[im 1/160]
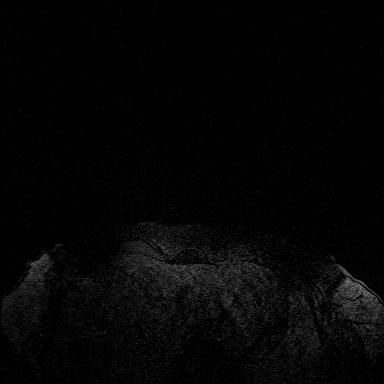
[im 40/160]
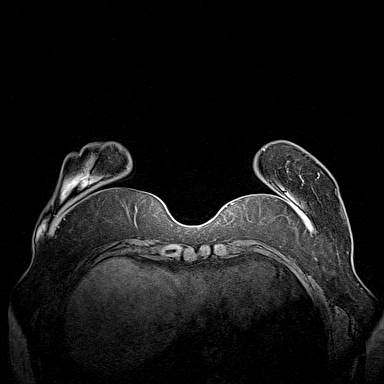
[im 80/160]
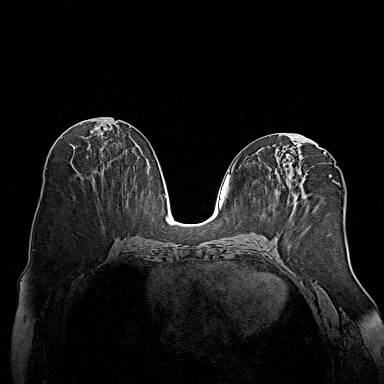
[im 120/160]
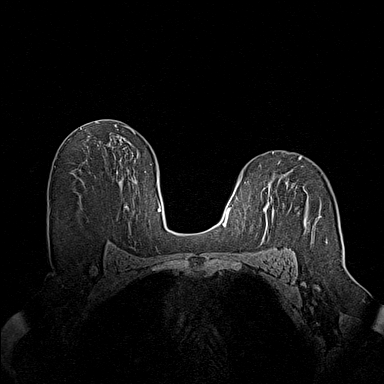
[im 160/160]
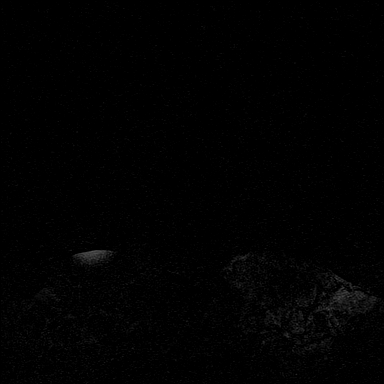

[Series 5: fl3d post-cm 20 · axial · 1.2mm · 1.04mm/px · z∈[-132,+59]mm · 5 of 160 slices shown (1 of 3)]
[im 1/160]
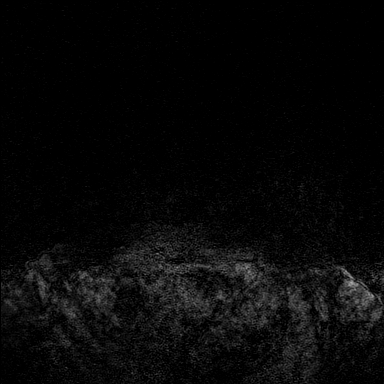
[im 40/160]
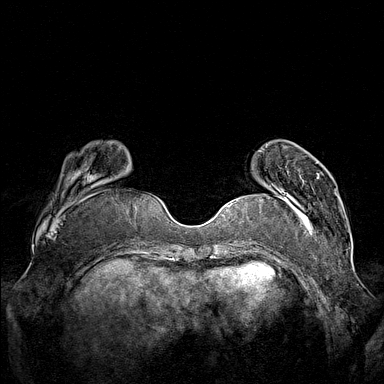
[im 80/160]
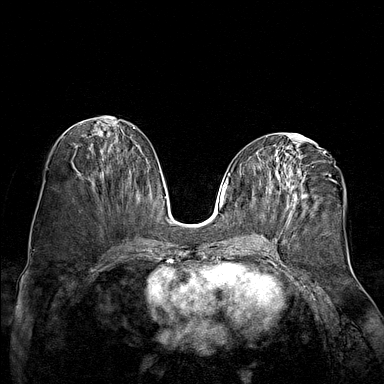
[im 120/160]
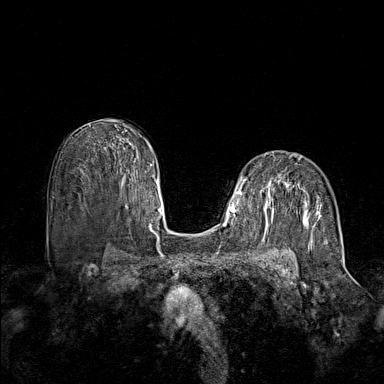
[im 160/160]
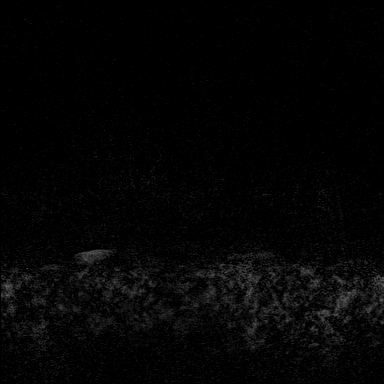

[Series 6: fl3d post-cm 20 · axial · 1.2mm · 1.04mm/px · z∈[-132,+59]mm · 5 of 160 slices shown (2 of 3)]
[im 1/160]
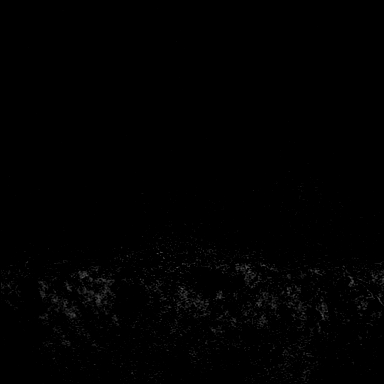
[im 40/160]
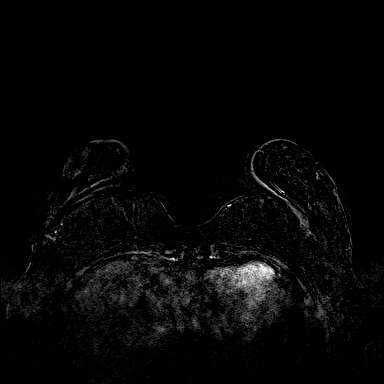
[im 80/160]
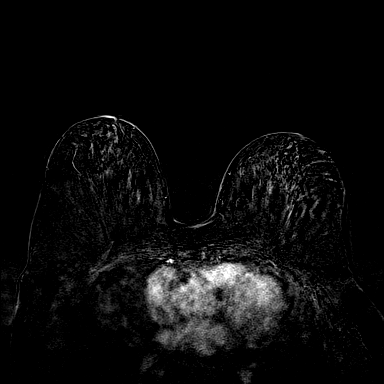
[im 120/160]
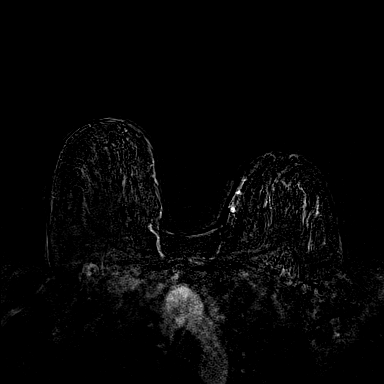
[im 160/160]
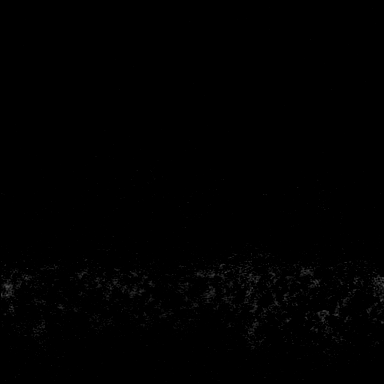

[Series 7: fl3d post-cm 20 · axial · 192.0mm · 1.04mm/px · 1 of 1 slices shown (3 of 3)]
[im 1/1]
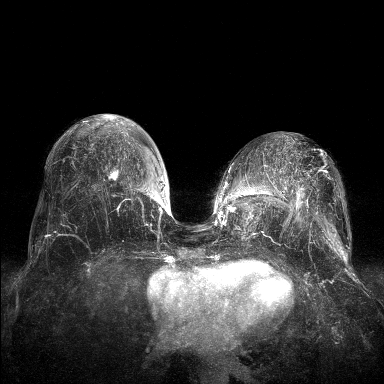

[Series 8: fl3d post-cm 3min · axial · 1.2mm · 1.04mm/px · z∈[-132,+59]mm · 6 of 160 slices shown]
[im 1/160]
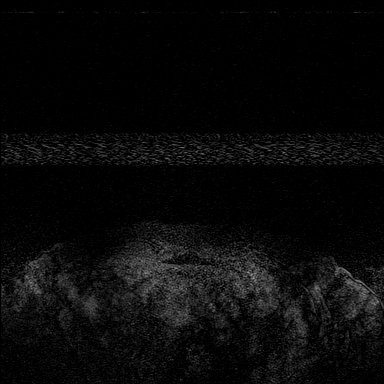
[im 32/160]
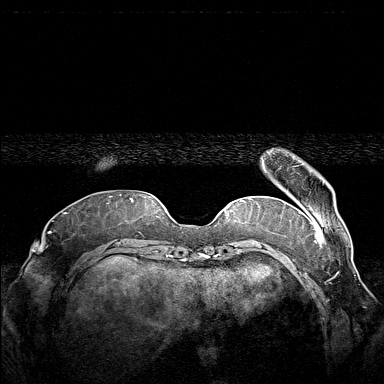
[im 64/160]
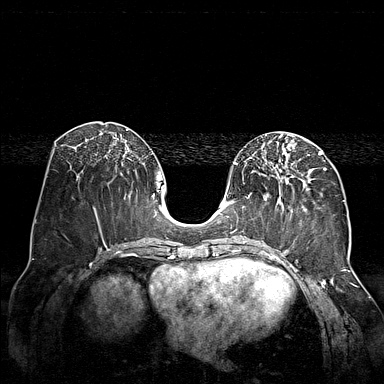
[im 96/160]
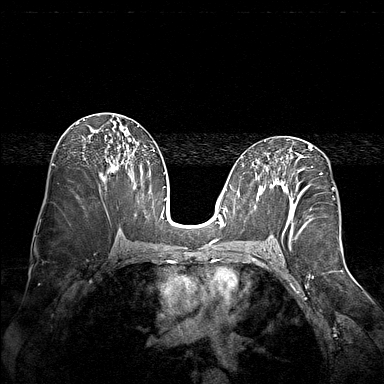
[im 128/160]
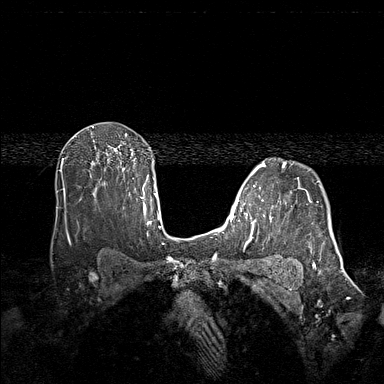
[im 160/160]
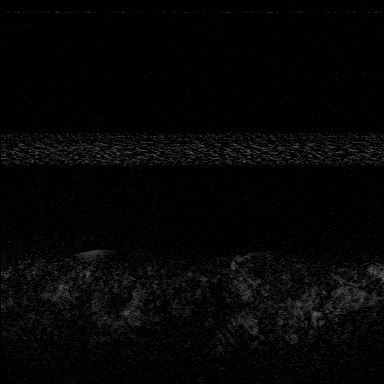

[Series 9: fl3d post-cm 3min_sub · axial · 1.2mm · 1.04mm/px · z∈[-132,-20]mm · 4 of 156 slices shown]
[im 1/156]
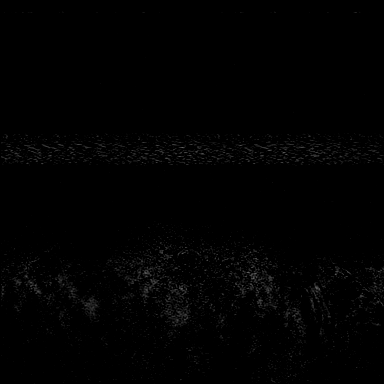
[im 32/156]
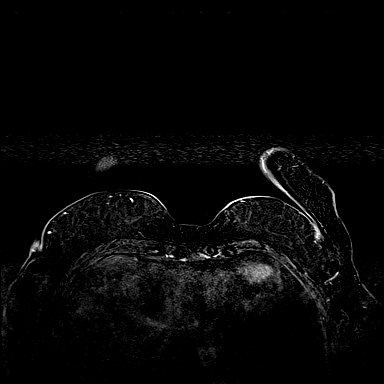
[im 63/156]
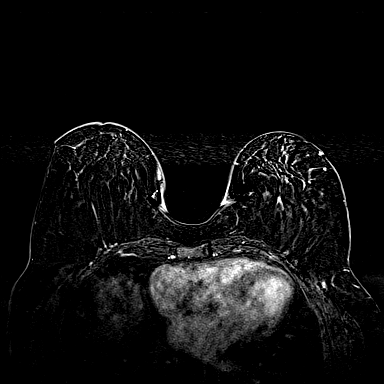
[im 94/156]
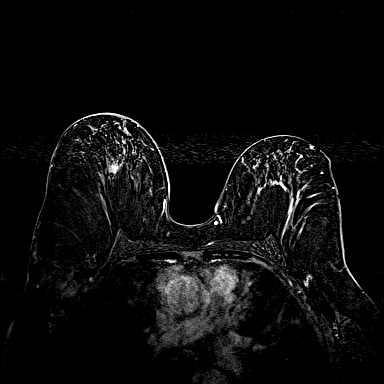

[32 of 48 positions shown; findings below may reference images not displayed]

Three-dimensional MR images were rendered by post-processing of the
original MR data on an independent workstation. The
three-dimensional MR images were interpreted, and findings are
reported in the following complete MRI report for this study. Three
dimensional images were evaluated at the independent interpreting
workstation using the DynaCAD thin client.
FINDINGS: Breast composition: b. Scattered fibroglandular tissue.

Background parenchymal enhancement: Mild

Right breast: There is an irregular 1.6 cm mass in the superior
central right breast. This is associated with susceptibility
artifact from a biopsy marking clip, and corresponds with the right
breast mass at 11 o'clock on ultrasound. There is another site of
susceptibility artifact in the superior anterior right breast
corresponding to the biopsy marking clip at 12 o'clock at the site
of known cancer. No clear associated enhancement is identified. No
other suspicious sites of enhancement are identified in the right
breast.

Left breast: No mass or abnormal enhancement.

Lymph nodes: There is at least one, and possibly two
abnormal-appearing level 1 lymph nodes identified in the right
axilla. Susceptibility artifact from the biopsy marking clip is not
well visualized.

Ancillary findings:  None.
IMPRESSION: 1. There is a 1.6 cm enhancing mass at the site of the known
malignancy at 11 o'clock. No enhancement is seen at the
biopsy-proven malignancy at 12 o'clock.

2. There is at least one, and possibly two abnormal-appearing level
1 lymph nodes in the right axilla.

3.  No evidence of left breast malignancy.

RECOMMENDATION:
1.  Continue treatment plan.

2.  Bilateral diagnostic mammogram is recommended in [DATE].

BI-RADS CATEGORY  6: Known biopsy-proven malignancy.

## 2020-07-02 MED ORDER — GADOBUTROL 1 MMOL/ML IV SOLN
10.0000 mL | Freq: Once | INTRAVENOUS | Status: AC | PRN
  Administered 2020-07-02: 10 mL via INTRAVENOUS

## 2020-07-03 ENCOUNTER — Encounter: Payer: Self-pay | Admitting: *Deleted

## 2020-07-04 ENCOUNTER — Ambulatory Visit: Payer: Self-pay | Admitting: Surgery

## 2020-07-04 DIAGNOSIS — C50911 Malignant neoplasm of unspecified site of right female breast: Secondary | ICD-10-CM

## 2020-07-04 NOTE — H&P (Signed)
[name] Appointment: 07/04/2020 2:20 PM Location: [facility] Patient #: [id number] DOB: 01/21/1956 Single / Language: Cleophus Molt / Race: Black or African American Female  History of Present Illness Marcello Moores A. Meda Dudzinski MD; 07/04/2020 2:34 PM) Patient words: Diagnosis 1. 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 2. Breast, right, needle core biopsy, 1 o'clock mass - FIBROCYSTIC CHANGE. - NO MALIGNANCY IDENTIFIED. Microscopic        Diagnosis 1. Breast, right, needle core biopsy, 12 o'clock - INVASIVE DUCTAL CARCINOMA - SEE COMMENT 2. Lymph node, needle/core biopsy, axilla - METASTATIC CARCINOMA INVOLVING A LYMPH NODE - SEE COMMENT   Patient returns after neoadjuvant chemotherapy for discussion of surgical options. Her most recent magnetic resonance imaging shows significant reduction of her breast disease on the right as well as lymph node disease in the right axilla. She would like to proceed with breast conserving surgery. Also, she is doing chemotherapy and her Port-A-Cath can be removed.  The patient is a 65 year old female.   Problem List/Past Medical Sharyn Lull R. Brooks, CMA; 07/04/2020 2:18 PM) BREAST CANCER METASTASIZED TO AXILLARY LYMPH NODE, RIGHT (C50.911)  Past Surgical History Sharyn Lull R. Rolena Infante, CMA; 07/04/2020 2:18 PM) No pertinent past surgical history  Diagnostic Studies History Sharyn Lull R. Rolena Infante, CMA; 07/04/2020 2:18 PM) Colonoscopy never Mammogram within last year Pap Smear 1-5 years ago  Allergies Sharyn Lull R. Rolena Infante, CMA; 07/04/2020 2:18 PM) Sulfa Antibiotics  Medication History Sharyn Lull R. Brooks, CMA; 07/04/2020 2:18 PM) Carvedilol (6.25MG  Tablet, Oral) Active. Losartan Potassium (100MG  Tablet, Oral) Active. metFORMIN HCl (1000MG  Tablet, Oral) Active. CeleBREX (200MG  Capsule, Oral) Active. Medications Reconciled  Social History Sharyn Lull R. Brooks, CMA; 07/04/2020 2:18  PM) Alcohol use Occasional alcohol use. Caffeine use Coffee, Tea. No drug use Tobacco use Former smoker.  Family History Sharyn Lull R. Rolena Infante, CMA; 07/04/2020 2:18 PM) Diabetes Mellitus Mother. Heart Disease Mother.  Pregnancy / Birth History Sharyn Lull R. Rolena Infante, CMA; 07/04/2020 2:18 PM) Age of menopause 10-55 Gravida 1 Irregular periods Length (months) of breastfeeding 3-6 Maternal age 61-30 Para 1  Other Problems Sharyn Lull R. Brooks, CMA; 07/04/2020 2:18 PM) Arthritis Back Pain Bladder Problems Diabetes Mellitus Gastroesophageal Reflux Disease Heart murmur High blood pressure     Physical Exam (Elisia Stepp A. Saul Dorsi MD; 07/04/2020 2:34 PM)  Breast Breast - Left-Symmetric, Non Tender, No Biopsy scars, no Dimpling - Left, No Inflammation, No Lumpectomy scars, No Mastectomy scars, No Peau d' Orange. Breast - Right-Symmetric, Non Tender, No Biopsy scars, no Dimpling - Right, No Inflammation, No Lumpectomy scars, No Mastectomy scars, No Peau d' Orange. Breast Lump-No Palpable Breast Mass.  Cardiovascular Cardiovascular examination reveals -normal heart sounds, regular rate and rhythm with no murmurs and normal pedal pulses bilaterally.  Neurologic Neurologic evaluation reveals -alert and oriented x 3 with no impairment of recent or remote memory. Mental Status-Normal.  Lymphatic Head & Neck  General Head & Neck Lymphatics: Bilateral - Description - Normal. Axillary  General Axillary Region: Bilateral - Description - Normal. Tenderness - Non Tender.    Assessment & Plan (Silvie Obremski A. Charm Stenner MD; 07/04/2020 2:27 PM)  BREAST CANCER METASTASIZED TO AXILLARY LYMPH NODE, RIGHT (C50.911) Impression: total time 45 minutes   right breast seed lumpectomy x 2 and targeted right axillary lymph node biopsy and SLN mapping  port removal  Current Plans You are being scheduled for surgery- Our schedulers will call you.  You should hear from our  office's scheduling department within 5 working days about the location, date,  and time of surgery. We try to make accommodations for patient's preferences in scheduling surgery, but sometimes the OR schedule or the surgeon's schedule prevents [place] from making those accommodations.  If you have not heard from our office [phone]) in 5 working days, call the office and ask for your surgeon's nurse.  If you have other questions about your diagnosis, plan, or surgery, call the office and ask for your surgeon's nurse.  Pt Education - [facility] Breast Biopsy HCI: discussed with patient and provided information. We discussed the staging and pathophysiology of breast cancer. We discussed all of the different options for treatment for breast cancer including surgery, chemotherapy, radiation therapy, Herceptin, and antiestrogen therapy. We discussed a sentinel lymph node biopsy as she does not appear to having lymph node involvement right now. We discussed the performance of that with injection of radioactive tracer and blue dye. We discussed that she would have an incision underneath her axillary hairline. We discussed that there is a bout a 10-20% chance of having a positive node with a sentinel lymph node biopsy and we will await the permanent pathology to make any other first further decisions in terms of her treatment. One of these options might be to return to the operating room to perform an axillary lymph node dissection. We discussed about a 1-2% risk lifetime of chronic shoulder pain as well as lymphedema associated with a sentinel lymph node biopsy. We discussed the options for treatment of the breast cancer which included lumpectomy versus a mastectomy. We discussed the performance of the lumpectomy with a wire placement. We discussed a 10-20% chance of a positive margin requiring reexcision in the operating room. We also discussed that she may need radiation therapy or antiestrogen therapy or both if she  undergoes lumpectomy. We discussed the mastectomy and the postoperative care for that as well. We discussed that there is no difference in her survival whether she undergoes lumpectomy with radiation therapy or antiestrogen therapy versus a mastectomy. There is a slight difference in the local recurrence rate being 3-5% with lumpectomy and about 1% with a mastectomy. We discussed the risks of operation including bleeding, infection, possible reoperation. She understands her further therapy will be based on what her stages at the time of her operation.  Pt Education - [facility] breast cancer surgery: discussed with patient and provided information.

## 2020-07-04 NOTE — H&P (View-Only) (Signed)
[name] Appointment: 07/04/2020 2:20 PM Location: [facility] Patient #: [id number] DOB: 11-28-1955 Single / Language: Cleophus Molt / Race: Black or African American Female  History of Present Illness Marcello Moores A. Nalee Lightle MD; 07/04/2020 2:34 PM) Patient words: Diagnosis 1. 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 2. Breast, right, needle core biopsy, 1 o'clock mass - FIBROCYSTIC CHANGE. - NO MALIGNANCY IDENTIFIED. Microscopic        Diagnosis 1. Breast, right, needle core biopsy, 12 o'clock - INVASIVE DUCTAL CARCINOMA - SEE COMMENT 2. Lymph node, needle/core biopsy, axilla - METASTATIC CARCINOMA INVOLVING A LYMPH NODE - SEE COMMENT   Patient returns after neoadjuvant chemotherapy for discussion of surgical options. Her most recent magnetic resonance imaging shows significant reduction of her breast disease on the right as well as lymph node disease in the right axilla. She would like to proceed with breast conserving surgery. Also, she is doing chemotherapy and her Port-A-Cath can be removed.  The patient is a 65 year old female.   Problem List/Past Medical Sharyn Lull R. Brooks, CMA; 07/04/2020 2:18 PM) BREAST CANCER METASTASIZED TO AXILLARY LYMPH NODE, RIGHT (C50.911)  Past Surgical History Sharyn Lull R. Rolena Infante, CMA; 07/04/2020 2:18 PM) No pertinent past surgical history  Diagnostic Studies History Sharyn Lull R. Rolena Infante, CMA; 07/04/2020 2:18 PM) Colonoscopy never Mammogram within last year Pap Smear 1-5 years ago  Allergies Sharyn Lull R. Rolena Infante, CMA; 07/04/2020 2:18 PM) Sulfa Antibiotics  Medication History Sharyn Lull R. Brooks, CMA; 07/04/2020 2:18 PM) Carvedilol (6.25MG  Tablet, Oral) Active. Losartan Potassium (100MG  Tablet, Oral) Active. metFORMIN HCl (1000MG  Tablet, Oral) Active. CeleBREX (200MG  Capsule, Oral) Active. Medications Reconciled  Social History Sharyn Lull R. Brooks, CMA; 07/04/2020 2:18  PM) Alcohol use Occasional alcohol use. Caffeine use Coffee, Tea. No drug use Tobacco use Former smoker.  Family History Sharyn Lull R. Rolena Infante, CMA; 07/04/2020 2:18 PM) Diabetes Mellitus Mother. Heart Disease Mother.  Pregnancy / Birth History Sharyn Lull R. Rolena Infante, CMA; 07/04/2020 2:18 PM) Age of menopause 93-55 Gravida 1 Irregular periods Length (months) of breastfeeding 3-6 Maternal age 58-30 Para 1  Other Problems Sharyn Lull R. Brooks, CMA; 07/04/2020 2:18 PM) Arthritis Back Pain Bladder Problems Diabetes Mellitus Gastroesophageal Reflux Disease Heart murmur High blood pressure     Physical Exam (Danecia Underdown A. Mikaelyn Arthurs MD; 07/04/2020 2:34 PM)  Breast Breast - Left-Symmetric, Non Tender, No Biopsy scars, no Dimpling - Left, No Inflammation, No Lumpectomy scars, No Mastectomy scars, No Peau d' Orange. Breast - Right-Symmetric, Non Tender, No Biopsy scars, no Dimpling - Right, No Inflammation, No Lumpectomy scars, No Mastectomy scars, No Peau d' Orange. Breast Lump-No Palpable Breast Mass.  Cardiovascular Cardiovascular examination reveals -normal heart sounds, regular rate and rhythm with no murmurs and normal pedal pulses bilaterally.  Neurologic Neurologic evaluation reveals -alert and oriented x 3 with no impairment of recent or remote memory. Mental Status-Normal.  Lymphatic Head & Neck  General Head & Neck Lymphatics: Bilateral - Description - Normal. Axillary  General Axillary Region: Bilateral - Description - Normal. Tenderness - Non Tender.    Assessment & Plan (Rozlyn Yerby A. Ladine Kiper MD; 07/04/2020 2:27 PM)  BREAST CANCER METASTASIZED TO AXILLARY LYMPH NODE, RIGHT (C50.911) Impression: total time 45 minutes   right breast seed lumpectomy x 2 and targeted right axillary lymph node biopsy and SLN mapping  port removal  Current Plans You are being scheduled for surgery- Our schedulers will call you.  You should hear from our  office's scheduling department within 5 working days about the location, date,  and time of surgery. We try to make accommodations for patient's preferences in scheduling surgery, but sometimes the OR schedule or the surgeon's schedule prevents [place] from making those accommodations.  If you have not heard from our office [phone]) in 5 working days, call the office and ask for your surgeon's nurse.  If you have other questions about your diagnosis, plan, or surgery, call the office and ask for your surgeon's nurse.  Pt Education - [facility] Breast Biopsy HCI: discussed with patient and provided information. We discussed the staging and pathophysiology of breast cancer. We discussed all of the different options for treatment for breast cancer including surgery, chemotherapy, radiation therapy, Herceptin, and antiestrogen therapy. We discussed a sentinel lymph node biopsy as she does not appear to having lymph node involvement right now. We discussed the performance of that with injection of radioactive tracer and blue dye. We discussed that she would have an incision underneath her axillary hairline. We discussed that there is a bout a 10-20% chance of having a positive node with a sentinel lymph node biopsy and we will await the permanent pathology to make any other first further decisions in terms of her treatment. One of these options might be to return to the operating room to perform an axillary lymph node dissection. We discussed about a 1-2% risk lifetime of chronic shoulder pain as well as lymphedema associated with a sentinel lymph node biopsy. We discussed the options for treatment of the breast cancer which included lumpectomy versus a mastectomy. We discussed the performance of the lumpectomy with a wire placement. We discussed a 10-20% chance of a positive margin requiring reexcision in the operating room. We also discussed that she may need radiation therapy or antiestrogen therapy or both if she  undergoes lumpectomy. We discussed the mastectomy and the postoperative care for that as well. We discussed that there is no difference in her survival whether she undergoes lumpectomy with radiation therapy or antiestrogen therapy versus a mastectomy. There is a slight difference in the local recurrence rate being 3-5% with lumpectomy and about 1% with a mastectomy. We discussed the risks of operation including bleeding, infection, possible reoperation. She understands her further therapy will be based on what her stages at the time of her operation.  Pt Education - [facility] breast cancer surgery: discussed with patient and provided information.

## 2020-07-09 ENCOUNTER — Other Ambulatory Visit: Payer: Self-pay | Admitting: Surgery

## 2020-07-09 ENCOUNTER — Encounter: Payer: Self-pay | Admitting: *Deleted

## 2020-07-09 DIAGNOSIS — C50911 Malignant neoplasm of unspecified site of right female breast: Secondary | ICD-10-CM

## 2020-07-16 ENCOUNTER — Encounter: Payer: Self-pay | Admitting: Oncology

## 2020-07-18 ENCOUNTER — Inpatient Hospital Stay: Payer: Self-pay | Attending: Oncology | Admitting: Oncology

## 2020-07-18 ENCOUNTER — Telehealth: Payer: Self-pay | Admitting: Oncology

## 2020-07-18 ENCOUNTER — Other Ambulatory Visit: Payer: Self-pay

## 2020-07-18 VITALS — BP 109/69 | HR 87 | Temp 97.8°F | Resp 17 | Ht 66.0 in | Wt 212.8 lb

## 2020-07-18 DIAGNOSIS — Z170 Estrogen receptor positive status [ER+]: Secondary | ICD-10-CM | POA: Insufficient documentation

## 2020-07-18 DIAGNOSIS — C50411 Malignant neoplasm of upper-outer quadrant of right female breast: Secondary | ICD-10-CM | POA: Insufficient documentation

## 2020-07-18 DIAGNOSIS — I82622 Acute embolism and thrombosis of deep veins of left upper extremity: Secondary | ICD-10-CM

## 2020-07-18 MED ORDER — RIVAROXABAN 20 MG PO TABS: 20.0000 mg | ORAL_TABLET | Freq: Every day | ORAL | 0 refills | Status: DC

## 2020-07-18 MED FILL — XARELTO 20 MG TABLET: 20 | 30 days supply | Qty: 30 | Fill #2

## 2020-07-18 NOTE — Progress Notes (Signed)
[facility]  [phone] [phone]     ID: [name] [name] DOB: 1955/10/24  MR#: [id number]  JAS#:[id number]  Patient Care Team: [name] Cowman, MD as PCP - General (Family Medicine) [name] Kaufmann, RN as Oncology Nurse Navigator [name] Germany, RN as Oncology Nurse Navigator [name] [name], [name] Dad, MD as Consulting Physician (Oncology) [name] Luna, MD as Consulting Physician (General Surgery) [name] Gibson, MD as Attending Physician (Radiation Oncology) [name] Cruel, MD OTHER MD:  CHIEF COMPLAINT: Estrogen receptor positive breast cancer  CURRENT TREATMENT: Definitive surgery pending   INTERVAL HISTORY: [name] [name] returns today for follow up of her estrogen receptor positive breast cancer.   Since her last visit, she underwent breast MRI on 07/02/2020 showing: breast composition B; 1.6 cm enhancing mass at site of known malignancy at 11 o'clock; no enhancement seen at biopsy-proven malignancy at 12 o'clock; at least one, possibly two, abnormal-appearing level 1 lymph nodes in right axilla; no evidence left breast malignancy.  She is scheduled for right breast lumpectomy x2 on 07/26/2020 under Dr. Brantley [name].   REVIEW OF SYSTEMS: Daily tells me her hands are still numb sometimes, but not all the time.  She thought that she was done with the rivaroxaban but of course she is not.  She has not taken it for the last 2 days as she ran out.  She says sometimes she has pain on the inside of both thighs.  She has had no unusual headaches no sudden visual changes no nausea or vomiting no mouth sores no cough phlegm production or pleurisy and no change in bowel or bladder habits.  Otherwise a detailed review of systems today was stable   COVID 19 VACCINATION STATUS: s/p  [employer]  X2, most recently June 2021, no booster as of 05/01/2020   HISTORY OF CURRENT ILLNESS: From the original intake note:  [name] [name] herself palpated a mass in the right axilla "a  long time ago", more recently with complaints of associated pain. She brought it to medical attention and underwent bilateral diagnostic mammography with tomography and bilateral breast ultrasonography at [facility] on 01/31/2020 showing: breast density category B; three right breast masses-- 1.9 cm at 12 o'clock, 2.3 cm at 11 o'clock, 0.4 cm at 1 o'clock; two adjacent enlarged thickened lymph nodes measuring up to 2.1 cm; no evidence of left breast malignancy.  Accordingly on 02/03/2020 she proceeded to biopsy of the right breast area at 12 o'clock and right axilla. The pathology from this procedure ([id number]) showed: invasive ductal carcinoma, grade 3. Prognostic indicators significant for: estrogen receptor, 100% positive with strong staining intensity and progesterone receptor, 0% negative. Proliferation marker Ki67 at 60%. HER2 equivocal by immunohistochemistry (2+), but negative by fluorescent in situ hybridization with a signals ratio 0.81 and number per cell 2.7.  The biopsied lymph node was found to show metastatic carcinoma. The morphology was considered different from the biopsied mass, and a prognostic panel was performed. Estrogen receptor, 100% positive with strong staining intensity and progesterone receptor, 0% negative. Proliferation marker Ki67 at 70%. HER2 equivcoal by immunohistochemistry (2+), but negative) by fluorescent in situ hybridization with a signals ratio 1.16 and number per cell 3.9.  She underwent additional right breast biopsies on 02/06/2020. Pathology [id number]) from the mass at 11 o'clock showed: invasive ductal carcinoma, grade 2. Prognostic indicators significant for: estrogen receptor, 100% positive with strong staining intensity and progesterone receptor, 0% negative. Proliferation marker Ki67 at 15%. HER2 equivocal by immunohistochemistry (2+), but negative by fluorescent  in situ hybridization with a signals ratio 1.29 and number per cell 2.7.  The biopsied  mass at 1 o'clock showed only fibrocystic change.  The patient's subsequent history is as detailed below.   PAST MEDICAL HISTORY: Past Medical History:  Diagnosis Date  . Arthritis   . Diabetes mellitus without complication ([place])   . Hypertension     PAST SURGICAL HISTORY: Past Surgical History:  Procedure Laterality Date  . IR IMAGING GUIDED PORT INSERTION  02/23/2020    FAMILY HISTORY: Family History  Problem Relation Age of Onset  . Asthma Mother   . Arthritis Sister   . Asthma Brother   . Arthritis Brother   [name] died in his 87s from causes unknown to the patient.  [name] died from heart disease at age 47.  The patient had 4 brothers, 1 sister, with no history of cancer in the family to her knowledge   GYNECOLOGIC HISTORY:  No LMP recorded. Patient is postmenopausal. Menarche: 65 years old Age at first live birth: 65 years old [place] P 1 LMP 2s Contraceptive HRT no  Hysterectomy? no BSO? no   SOCIAL HISTORY: (updated 02/2020)  [name] [name] is originally from [place].  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  She describes herself as single.  At home she lives with her daughter [name] who works for [employer], her daughters husband [name] who is a English as a second language teacher, and their children who are 59 months old and 21 years old.  The patient is in [place]: Not in place.  The patient tells me she would intend to name her daughter [name] as her healthcare power of attorney   HEALTH MAINTENANCE: Social History   Tobacco Use  . Smoking status: Never Smoker  . Smokeless tobacco: Never Used  Vaping Use  . Vaping Use: Never used  Substance Use Topics  . Alcohol use: Yes    Alcohol/week: 1.0 standard drink    Types: 1 Glasses of wine per week    Comment: occasionally  . Drug use: Never     Colonoscopy: never done  PAP: 2017 (performed in [place])  Bone density:    Allergies  Allergen Reactions   . Sulfa Antibiotics Itching    Current Outpatient Medications  Medication Sig Dispense Refill  . acetaminophen (TYLENOL) 500 MG tablet Take 500 mg by mouth 3 (three) times daily as needed.    Marland Kitchen amLODipine (NORVASC) 5 MG tablet Take 5 mg by mouth daily.    Marland Kitchen atorvastatin (LIPITOR) 40 MG tablet Take 40 mg by mouth daily.    . carvedilol (COREG) 6.25 MG tablet Take 6.25 mg by mouth 2 (two) times daily with a meal.    . celecoxib (CELEBREX) 200 MG capsule Take 200 mg by mouth daily.     Marland Kitchen dexamethasone (DECADRON) 4 MG tablet Take 2 tablets (8 mg total) by mouth daily. Take daily for 3 days after chemo. Take with food. 30 tablet 1  . fluconazole (DIFLUCAN) 200 MG tablet Take 1 tablet (200 mg total) by mouth daily. (Patient not taking: Reported on 03/26/2020) 30 tablet 0  . lansoprazole (PREVACID) 30 MG capsule Take 30 mg by mouth every morning.    Marland Kitchen losartan (COZAAR) 100 MG tablet Take 100 mg by mouth daily.    . metFORMIN (GLUCOPHAGE) 1000 MG tablet Take 1,000 mg by mouth daily with breakfast.     . prochlorperazine (COMPAZINE) 10 MG tablet  Take 1 tablet (10 mg total) by mouth every 6 (six) hours as needed (Nausea or vomiting). 30 tablet 1  . rivaroxaban (XARELTO) 20 MG TABS tablet Take 1 tablet (20 mg total) by mouth daily with supper. 35 tablet 0   No current facility-administered medications for this visit.    OBJECTIVE: African-American woman who appears older than stated age  1:   07/18/20 1534  BP: 109/69  Pulse: 87  Resp: 17  Temp: 97.8 F (36.6 C)  SpO2: 99%     Body mass index is 34.35 kg/m.   Wt Readings from Last 3 Encounters:  07/18/20 212 lb 12.8 oz (96.5 kg)  06/25/20 213 lb 12.8 oz (97 kg)  06/05/20 214 lb 9.6 oz (97.3 kg)      ECOG FS:1 - Symptomatic but completely ambulatory  Sclerae unicteric, EOMs intact Wearing a mask No cervical or supraclavicular adenopathy Lungs no rales or rhonchi Heart regular rate and rhythm Abd soft, nontender, positive  bowel sounds MSK no focal spinal tenderness, uses a cane Neuro: nonfocal, well oriented, appropriate affect Breasts: I do not palpate a mass in the right breast over the right axilla.  The left breast is benign.   LAB RESULTS:  CMP     Component Value Date/Time   NA 136 06/25/2020 1230   K 3.9 06/25/2020 1230   CL 107 06/25/2020 1230   CO2 22 06/25/2020 1230   GLUCOSE 110 (H) 06/25/2020 1230   BUN 7 (L) 06/25/2020 1230   CREATININE 0.73 06/25/2020 1230   CREATININE 0.76 04/02/2020 0900   CALCIUM 9.3 06/25/2020 1230   PROT 6.2 (L) 06/25/2020 1230   ALBUMIN 3.9 06/25/2020 1230   AST 19 06/25/2020 1230   AST 10 (L) 04/02/2020 0900   ALT 13 06/25/2020 1230   ALT 14 04/02/2020 0900   ALKPHOS 59 06/25/2020 1230   BILITOT 0.4 06/25/2020 1230   BILITOT 0.6 04/02/2020 0900   GFRNONAA >60 06/25/2020 1230   GFRNONAA >60 04/02/2020 0900    No results found for: TOTALPROTELP, ALBUMINELP, A1GS, A2GS, BETS, BETA2SER, GAMS, MSPIKE, SPEI  Lab Results  Component Value Date   WBC 9.3 06/25/2020   NEUTROABS 7.1 06/25/2020   HGB 9.5 (L) 06/25/2020   HCT 29.0 (L) 06/25/2020   MCV 94.8 06/25/2020   PLT 365 06/25/2020    No results found for: LABCA2  No components found for: MHDQQI297  No results for input(s): INR in the last 168 hours.  No results found for: LABCA2  No results found for: LGX211  No results found for: HER740  No results found for: CXK481  No results found for: CA2729  No components found for: HGQUANT  No results found for: CEA1 / No results found for: CEA1   No results found for: AFPTUMOR  No results found for: CHROMOGRNA  No results found for: KPAFRELGTCHN, LAMBDASER, KAPLAMBRATIO (kappa/lambda light chains)  No results found for: HGBA, HGBA2QUANT, HGBFQUANT, HGBSQUAN (Hemoglobinopathy evaluation)   No results found for: LDH  No results found for: IRON, TIBC, IRONPCTSAT (Iron and TIBC)  No results found for: FERRITIN  Urinalysis No results  found for: COLORURINE, APPEARANCEUR, LABSPEC, PHURINE, GLUCOSEU, HGBUR, BILIRUBINUR, KETONESUR, PROTEINUR, UROBILINOGEN, NITRITE, LEUKOCYTESUR   STUDIES: MR BREAST BILATERAL W WO CONTRAST INC CAD  Result Date: 07/02/2020 CLINICAL DATA:  65 year old female diagnosed with grade 3 invasive ductal carcinoma in the right breast at 12 o'clock (ribbon clip), and in the right breast at 11 o'clock (coil clip) as well as a metastatic  right axillary lymph node. She had an additional biopsy of a site in the right breast at 1 o'clock, 8 cm from the nipple (heart clip) which was benign fibrocystic change. LABS:  Creatinine of 0.73 mg/dL and GFR of greater than 60 on 06/25/2020. EXAM: BILATERAL BREAST MRI WITH AND WITHOUT CONTRAST TECHNIQUE: Multiplanar, multisequence MR images of both breasts were obtained prior to and following the intravenous administration of 10 ml of Gadavist Three-dimensional MR images were rendered by post-processing of the original MR data on an independent workstation. The three-dimensional MR images were interpreted, and findings are reported in the following complete MRI report for this study. Three dimensional images were evaluated at the independent interpreting workstation using the DynaCAD thin client. COMPARISON:  No prior MRI available for comparison. Correlation made with prior mammogram and ultrasound images. FINDINGS: Breast composition: b. Scattered fibroglandular tissue. Background parenchymal enhancement: Mild Right breast: There is an irregular 1.6 cm mass in the superior central right breast. This is associated with susceptibility artifact from a biopsy marking clip, and corresponds with the right breast mass at 11 o'clock on ultrasound. There is another site of susceptibility artifact in the superior anterior right breast corresponding to the biopsy marking clip at 12 o'clock at the site of known cancer. No clear associated enhancement is identified. No other suspicious sites of  enhancement are identified in the right breast. Left breast: No mass or abnormal enhancement. Lymph nodes: There is at least one, and possibly two abnormal-appearing level 1 lymph nodes identified in the right axilla. Susceptibility artifact from the biopsy marking clip is not well visualized. Ancillary findings:  None. IMPRESSION: 1. There is a 1.6 cm enhancing mass at the site of the known malignancy at 11 o'clock. No enhancement is seen at the biopsy-proven malignancy at 12 o'clock. 2. There is at least one, and possibly two abnormal-appearing level 1 lymph nodes in the right axilla. 3.  No evidence of left breast malignancy. RECOMMENDATION: 1.  Continue treatment plan. 2.  Bilateral diagnostic mammogram is recommended in September 2022. BI-RADS CATEGORY  6: Known biopsy-proven malignancy. Electronically Signed   By: Ammie Ferrier M.D.   On: 07/02/2020 14:27     ELIGIBLE FOR AVAILABLE RESEARCH PROTOCOL: AET  ASSESSMENT: 65 y.o. [name] [name] woman status post right breast upper outer quadrant (12:00) and axillary lymph node biopsy 02/03/2020, both positive for a clinical T1c N1, [name] IIB invasive ductal carcinoma, grade 3, estrogen receptor positive, HER-2 and progesterone receptor negative, with an MIB-1 of 60%.  (a) biopsy of a second right upper outer quadrant T2 lesion (11 o'clock) 02/06/2020 also showed invasive ductal carcinoma, but grade 2, estrogen receptor positive, progesterone receptor and HER-2 negative, with an MIB-1 of 15%  (b) biopsy of a third right breast lesion (1:00) was benign   (1) neoadjuvant chemotherapy consisting of cyclophosphamide and docetaxel every 21 days x 4 starting 03/05/2020  (a) discontinued after 2 cycles because of peripheral neuropathy  (b) cyclophosphamide/doxorubicin started 04/24/2020, repeated every 21 days x4, completed 06/25/2020  (c) echo 04/17/2020 showed EF of 55-60%  (2) definitive surgery to follow  (3) adjuvant radiation  (4) antiestrogens to  follow  (5) CT scan of the chest 04/23/2020 shows a left brachiocephalicl catheter associated clot  (a) rivaroxaban started 04/24/2020   PLAN: [name] [name] did generally well with her chemotherapy.  The MRI in February did not show complete resolution so I expect there will be residual disease.  However her tumor is estrogen receptor positive and we will maximize  antiestrogen treatment postop.  We will have her port removed at the time of surgery.  I would like to continue the rivaroxaban for 1 month after port removal and I have failed in that prescription for her.  She understands to take her last rivaroxaban on 03 15, not take it on 03 16 00 317, and to resume it 03 18 assuming no bleeding postop  I am going to see her virtually on 08/22/2020 just to make sure she is recovered well D-dimer sometime in May to make sure that she stops the rivaroxaban the following day.  I will then set her up for a repeat Doppler studies and D-dimer sometime in May.  She will be ready to start radiation sometime around that time as well.  Total encounter time 30 minutes.Sarajane Jews C. Nysa Sarin, MD 07/18/20 4:08 PM Medical Oncology and Hematology [facility] [place], [place] [address] Tel. [phone]    Fax. [phone]   I, Wilburn Mylar, am acting as scribe for Dr. Virgie [name]. Charlene Cowdrey.  I, Lurline Del MD, have reviewed the above documentation for accuracy and completeness, and I agree with the above.   *Total Encounter Time as defined by the Centers for Medicare and Medicaid Services includes, in addition to the face-to-face time of a patient visit (documented in the note above) non-face-to-face time: obtaining and reviewing outside history, ordering and reviewing medications, tests or procedures, care coordination (communications with other health care professionals or caregivers) and documentation in the medical record.

## 2020-07-18 NOTE — Telephone Encounter (Signed)
Scheduled appts per 3/9 los. Pt stated she would refer to mychart for appts.

## 2020-07-19 ENCOUNTER — Encounter (HOSPITAL_BASED_OUTPATIENT_CLINIC_OR_DEPARTMENT_OTHER): Payer: Self-pay | Admitting: Surgery

## 2020-07-20 ENCOUNTER — Encounter (HOSPITAL_BASED_OUTPATIENT_CLINIC_OR_DEPARTMENT_OTHER)
Admission: RE | Admit: 2020-07-20 | Discharge: 2020-07-20 | Disposition: A | Discharge status: Home / Self Care | Payer: No Typology Code available for payment source | Source: Ambulatory Visit | Attending: Surgery | Admitting: Surgery

## 2020-07-20 DIAGNOSIS — Z01818 Encounter for other preprocedural examination: Secondary | ICD-10-CM | POA: Insufficient documentation

## 2020-07-20 LAB — CBC WITH DIFFERENTIAL/PLATELET
Abs Immature Granulocytes: 0.01 K/uL (ref 0.00–0.07)
Basophils Absolute: 0 K/uL (ref 0.0–0.1)
Basophils Relative: 1 %
Eosinophils Absolute: 0 K/uL (ref 0.0–0.5)
Eosinophils Relative: 1 %
HCT: 33.5 % — ABNORMAL LOW (ref 36.0–46.0)
Hemoglobin: 10.9 g/dL — ABNORMAL LOW (ref 12.0–15.0)
Immature Granulocytes: 0 %
Lymphocytes Relative: 32 %
Lymphs Abs: 1 K/uL (ref 0.7–4.0)
MCH: 32.4 pg (ref 26.0–34.0)
MCHC: 32.5 g/dL (ref 30.0–36.0)
MCV: 99.7 fL (ref 80.0–100.0)
Monocytes Absolute: 0.5 K/uL (ref 0.1–1.0)
Monocytes Relative: 14 %
Neutro Abs: 1.6 K/uL — ABNORMAL LOW (ref 1.7–7.7)
Neutrophils Relative %: 52 %
Platelets: 357 K/uL (ref 150–400)
RBC: 3.36 MIL/uL — ABNORMAL LOW (ref 3.87–5.11)
RDW: 17.3 % — ABNORMAL HIGH (ref 11.5–15.5)
WBC: 3.2 K/uL — ABNORMAL LOW (ref 4.0–10.5)
nRBC: 0 % (ref 0.0–0.2)

## 2020-07-20 LAB — COMPREHENSIVE METABOLIC PANEL
ALT: 21 U/L (ref 0–44)
AST: 28 U/L (ref 15–41)
Albumin: 4 g/dL (ref 3.5–5.0)
Alkaline Phosphatase: 59 U/L (ref 38–126)
Anion gap: 6 (ref 5–15)
BUN: 5 mg/dL — ABNORMAL LOW (ref 8–23)
CO2: 27 mmol/L (ref 22–32)
Calcium: 9.5 mg/dL (ref 8.9–10.3)
Chloride: 105 mmol/L (ref 98–111)
Creatinine, Ser: 0.53 mg/dL (ref 0.44–1.00)
GFR, Estimated: >60 mL/min (ref >60)
Glucose, Bld: 102 mg/dL — ABNORMAL HIGH (ref 70–99)
Potassium: 4.4 mmol/L (ref 3.5–5.1)
Sodium: 138 mmol/L (ref 135–145)
Total Bilirubin: 0.4 mg/dL (ref 0.3–1.2)
Total Protein: 6.2 g/dL — ABNORMAL LOW (ref 6.5–8.1)

## 2020-07-20 NOTE — Progress Notes (Signed)
      Enhanced Recovery after Surgery for Orthopedics Enhanced Recovery after Surgery is a protocol used to improve the stress on your body and your recovery after surgery.  Patient Instructions  . The night before surgery:  o No food after midnight. ONLY clear liquids after midnight  . The day of surgery (if you do NOT have diabetes):  o Drink ONE (1) Pre-Surgery Clear Ensure as directed.   o This drink was given to you during your hospital  pre-op appointment visit. o The pre-op nurse will instruct you on the time to drink the  Pre-Surgery Ensure depending on your surgery time. o Finish the drink at the designated time by the pre-op nurse.  o Nothing else to drink after completing the  Pre-Surgery Clear Ensure.  . The day of surgery (if you have diabetes): o Drink ONE (1) Gatorade 2 (G2) as directed. o This drink was given to you during your hospital  pre-op appointment visit.  o The pre-op nurse will instruct you on the time to drink the   Gatorade 2 (G2) depending on your surgery time. o Color of the Gatorade may vary. Red is not allowed. o Nothing else to drink after completing the  Gatorade 2 (G2).         If you have questions, please contact your surgeon's office.  Given surgical soap and instructions. Verbalized understanding. 

## 2020-07-23 ENCOUNTER — Other Ambulatory Visit (HOSPITAL_COMMUNITY)
Admission: RE | Admit: 2020-07-23 | Discharge: 2020-07-23 | Disposition: A | Discharge status: Home / Self Care | Payer: No Typology Code available for payment source | Source: Ambulatory Visit | Attending: Surgery | Admitting: Surgery

## 2020-07-23 DIAGNOSIS — Z20822 Contact with and (suspected) exposure to covid-19: Secondary | ICD-10-CM | POA: Insufficient documentation

## 2020-07-23 DIAGNOSIS — Z01812 Encounter for preprocedural laboratory examination: Secondary | ICD-10-CM | POA: Insufficient documentation

## 2020-07-24 LAB — SARS CORONAVIRUS 2 (TAT 6-24 HRS): SARS Coronavirus 2: NEGATIVE

## 2020-07-25 ENCOUNTER — Ambulatory Visit
Admission: RE | Admit: 2020-07-25 | Discharge: 2020-07-25 | Disposition: A | Discharge status: Home / Self Care | Payer: No Typology Code available for payment source | Source: Ambulatory Visit | Attending: Surgery | Admitting: Surgery

## 2020-07-25 ENCOUNTER — Other Ambulatory Visit: Payer: Self-pay

## 2020-07-25 ENCOUNTER — Other Ambulatory Visit: Payer: Self-pay | Admitting: Surgery

## 2020-07-25 DIAGNOSIS — C50911 Malignant neoplasm of unspecified site of right female breast: Secondary | ICD-10-CM

## 2020-07-25 IMAGING — MG MM BREAST LOCALIZATION CLIP
4 series · 4 of 12 positions shown · non-contrast
Comparison: Previous exam(s).

CLINICAL DATA: Status post ultrasound-guided radioactive seed
localization invasive ductal carcinomas in the 12 o'clock and 11
o'clock position of the right breast and a metastatic right axillary
lymph node.

EXAM:
DIAGNOSTIC RIGHT MAMMOGRAM POST ULTRASOUND-GUIDED RADIOACTIVE SEED
PLACEMENT X 3

[R MLO synth-2D]
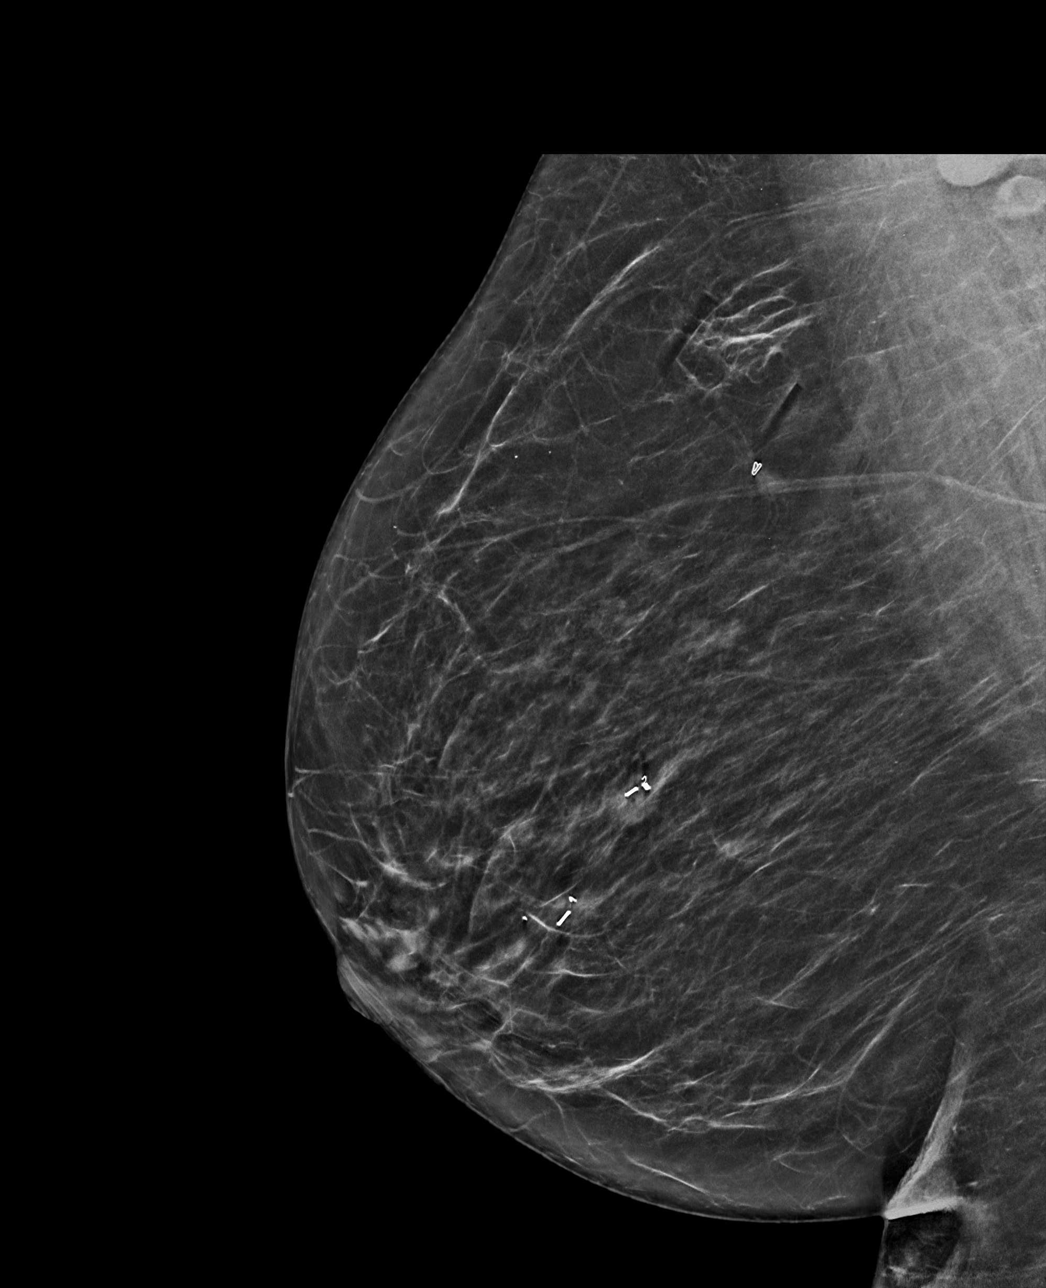

[R CC synth-2D]
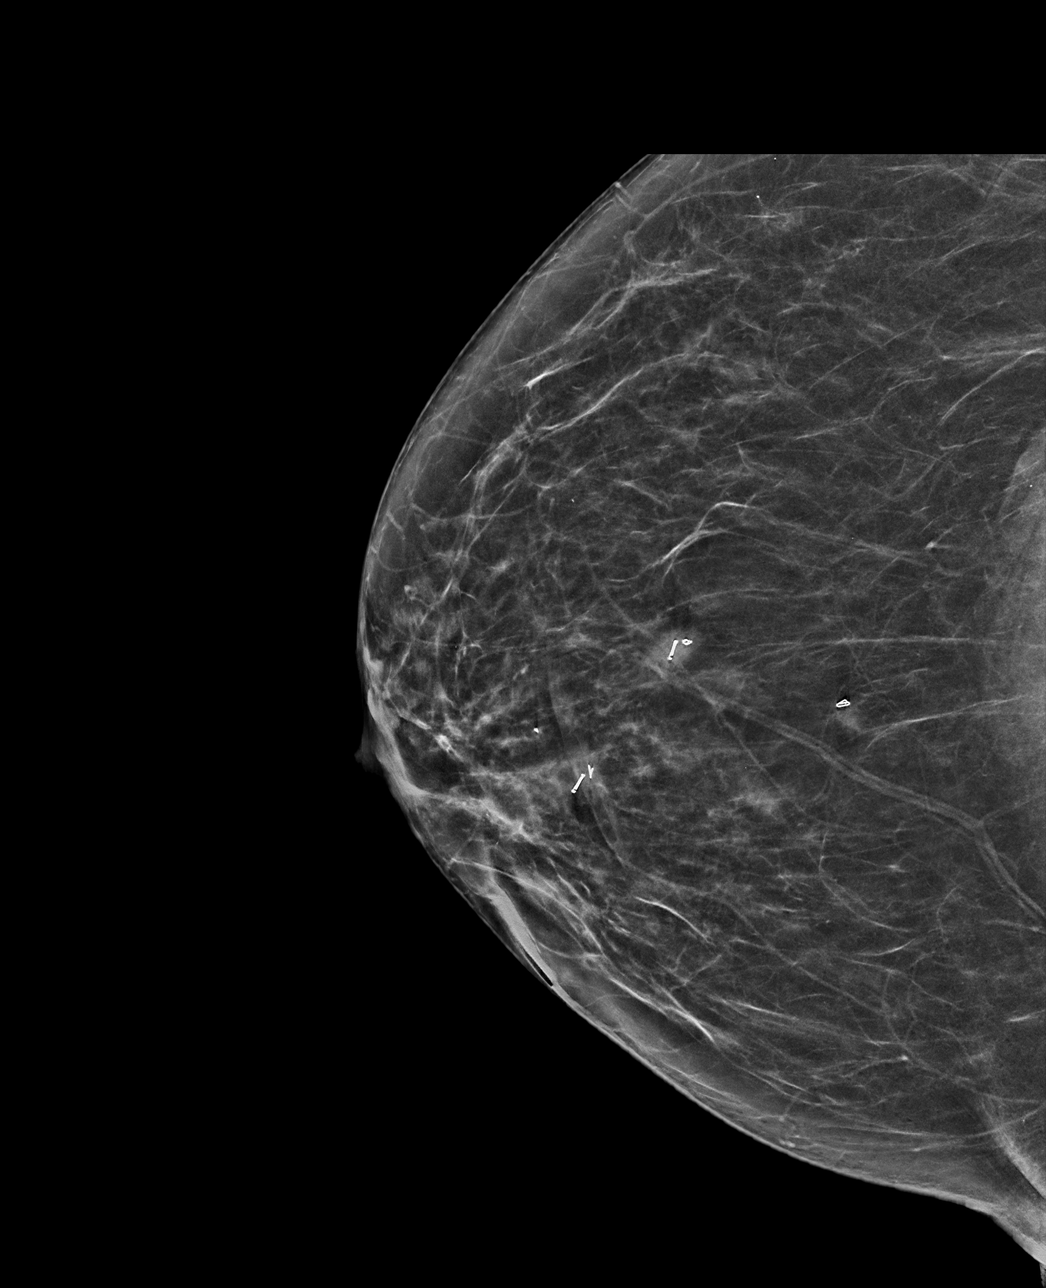

[R CC tomo · tomo slice 35/68.0]
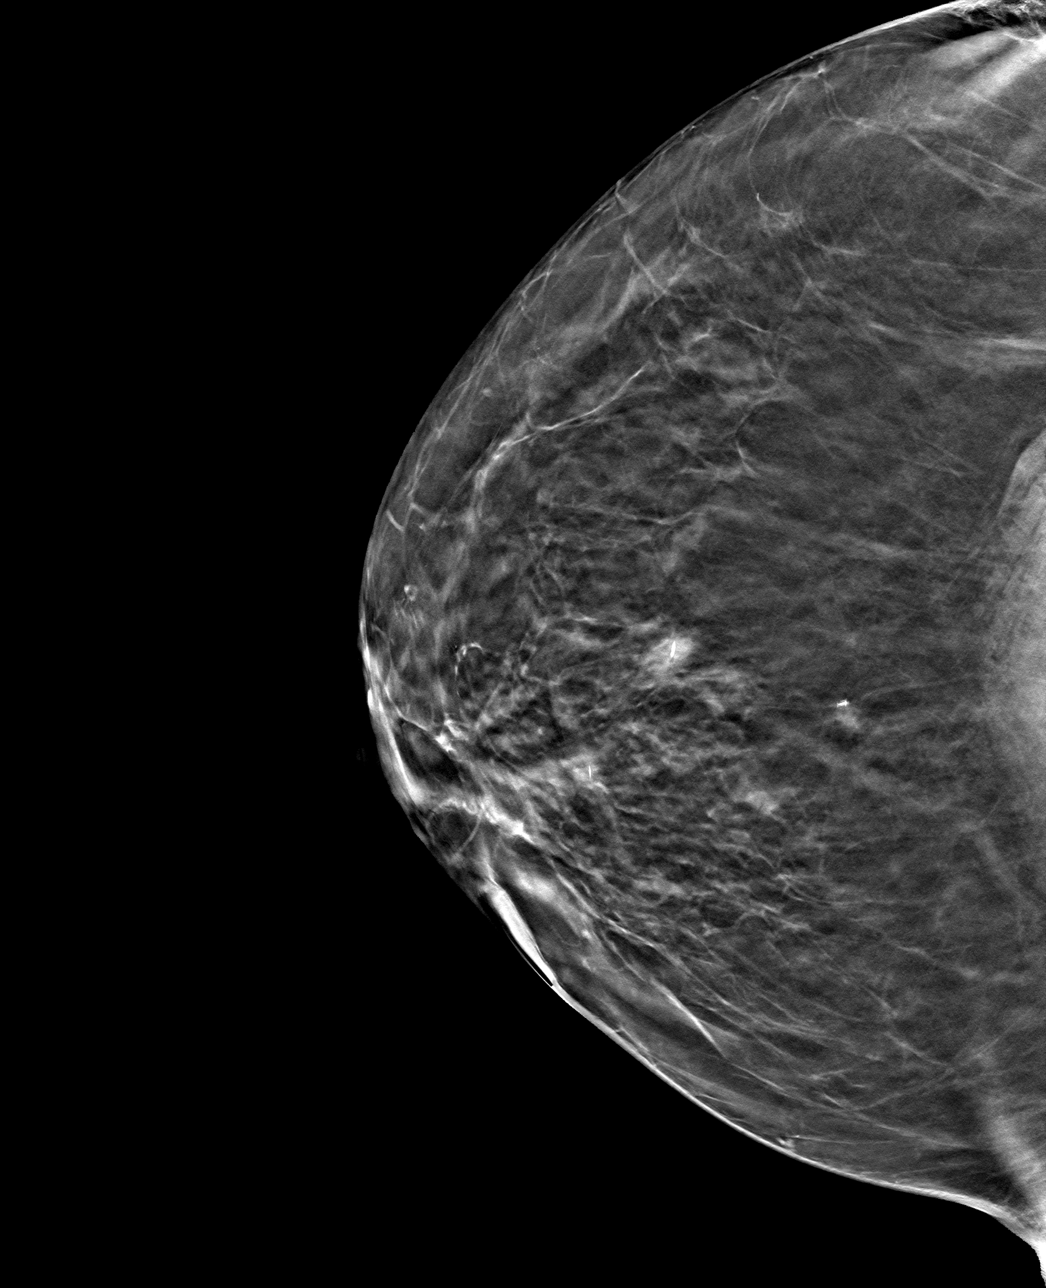

[R MLO tomo · tomo slice 33/66.0]
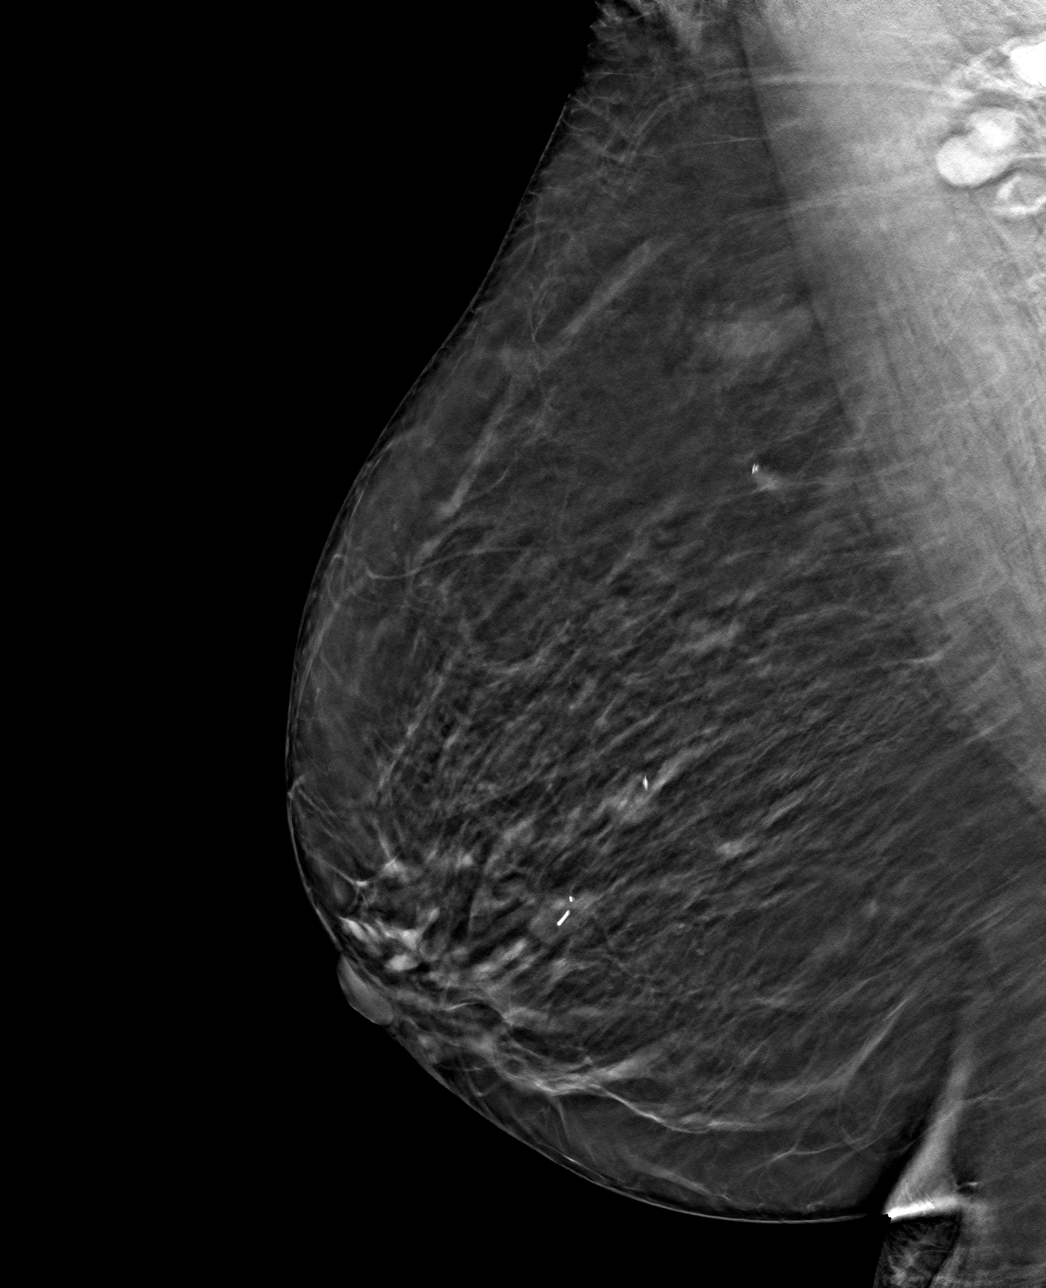

[4 of 12 positions shown; findings below may reference images not displayed]

FINDINGS: Mammographic images were obtained following ultrasound-guided
radioactive seed placement x 3. These demonstrate a radioactive seed
adjacent to the ribbon shaped biopsy marker clip in the 12 o'clock
position of the right breast, a radioactive seed adjacent to the
coil shaped biopsy marker clip in the 11 o'clock position of the
right breast and a radioactive seed in the previously biopsied right
axillary lymph node. The HydroMARK clip in the right axilla again
could not be included but was well seen at ultrasound in the
biopsied and localized lymph node.
IMPRESSION: Appropriate positions of 2 radioactive seeds in the right breast and
1 radioactive seed in the right axilla.

Final Assessment: Post Procedure Mammograms for Seed Placement

## 2020-07-25 IMAGING — US US NEEDLE LOCALIZATION*R*
1 series · 7 of 7 positions shown · non-contrast
Comparison: Previous exam(s).

CLINICAL DATA: Right breast invasive ductal carcinoma diagnosed in
the 12 o'clock and 11 o'clock positions of the breast as well as a
metastatic right axillary lymph node in [DATE]. She has
subsequently undergone neoadjuvant chemotherapy. Pre lumpectomy
localizations.

EXAM:
ULTRASOUND GUIDED RADIOACTIVE SEED LOCALIZATION OF THE RIGHT BREAST
X 2
ULTRASOUND-GUIDED RADIOACTIVE SEED LOCALIZATION THE RIGHT AXILLA

[Series 1: us needle localization*right* · 0.08mm/px · 7 of 7 slices shown]
[im 1/7]
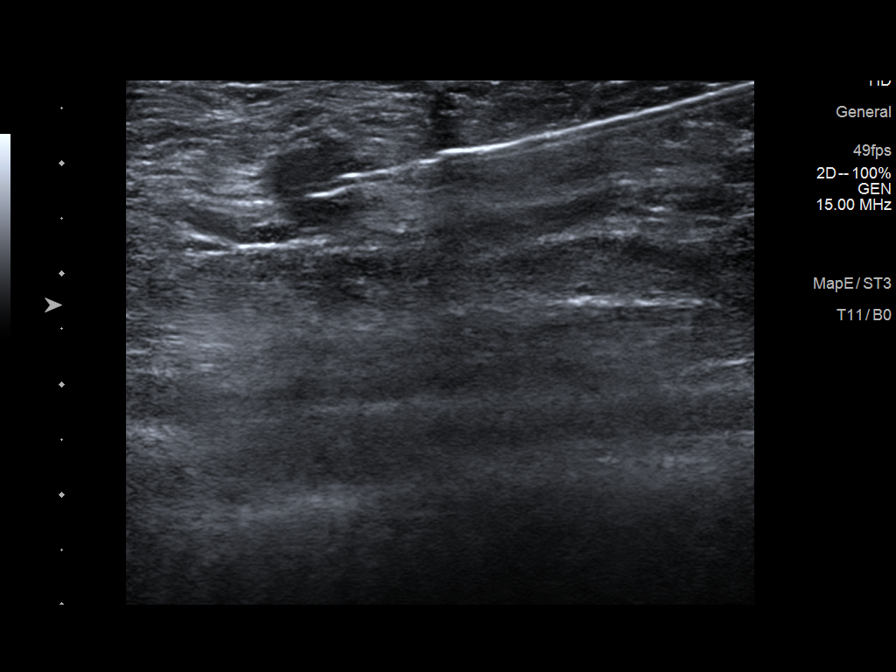
[im 2/7]
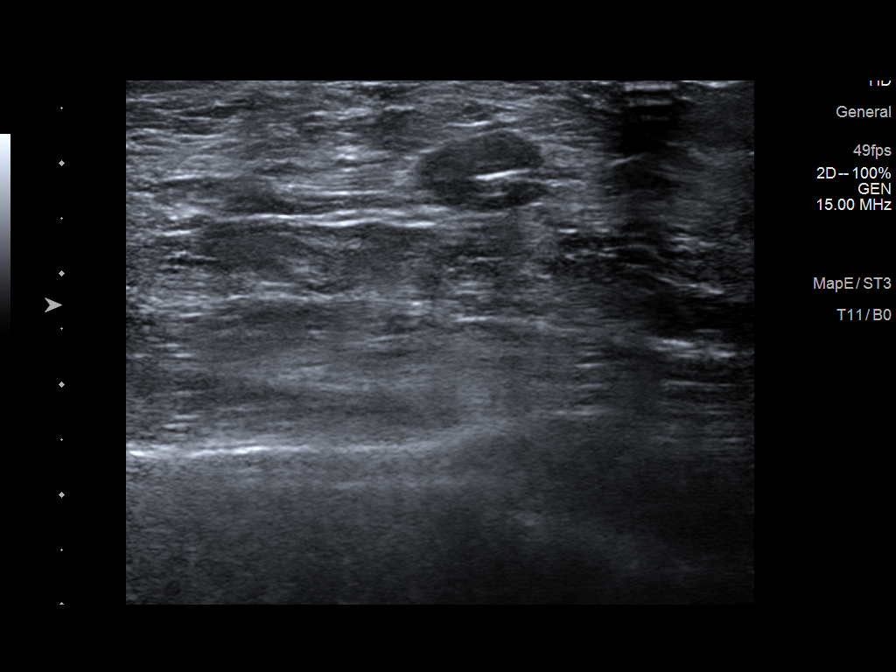
[im 3/7]
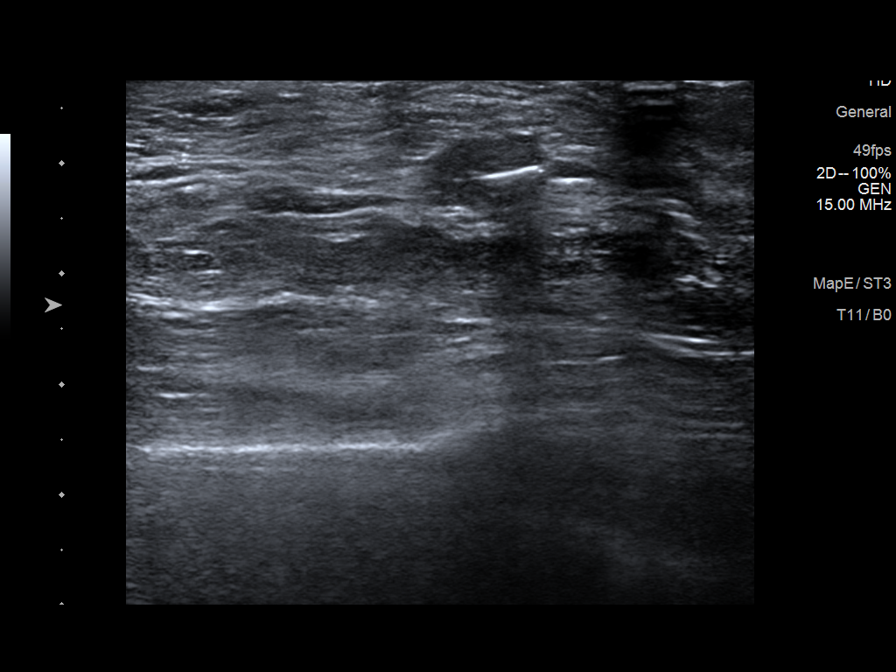
[im 4/7]
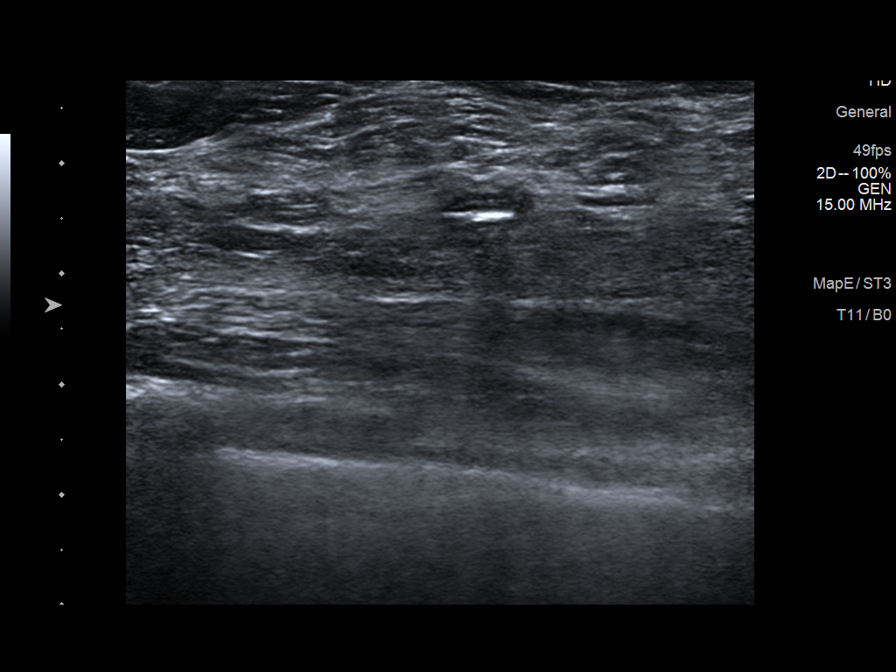
[im 5/7]
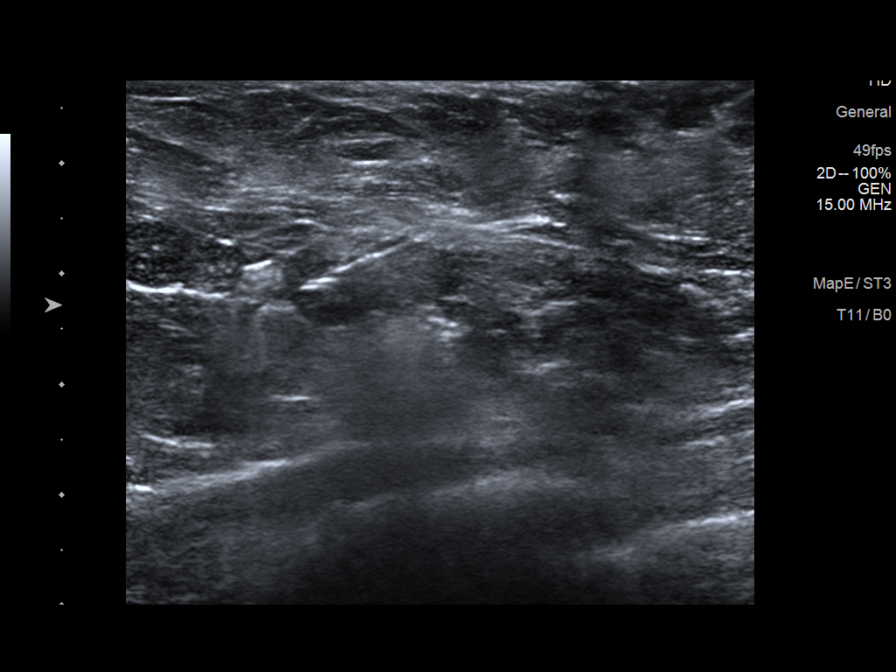
[im 6/7]
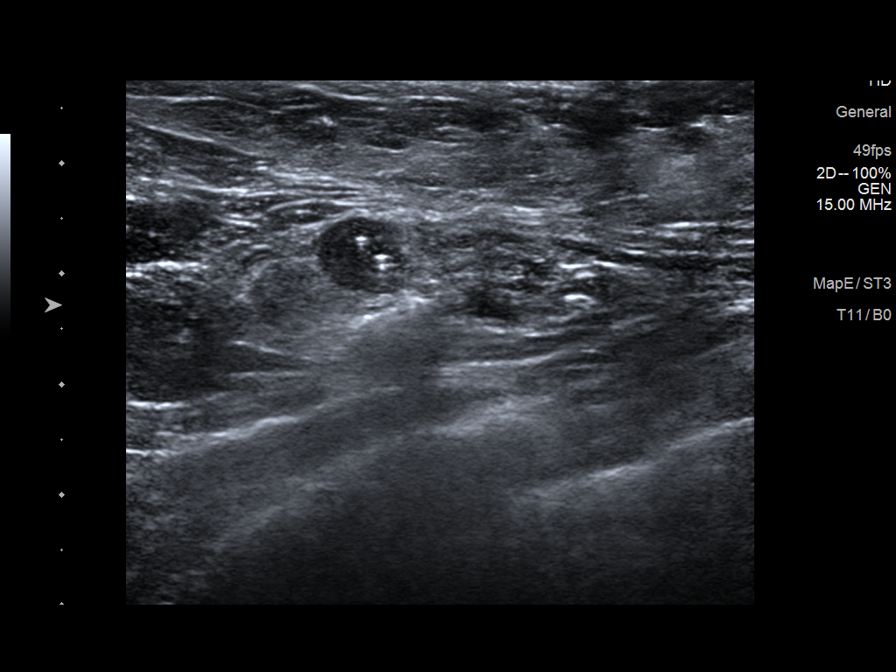
[im 7/7]
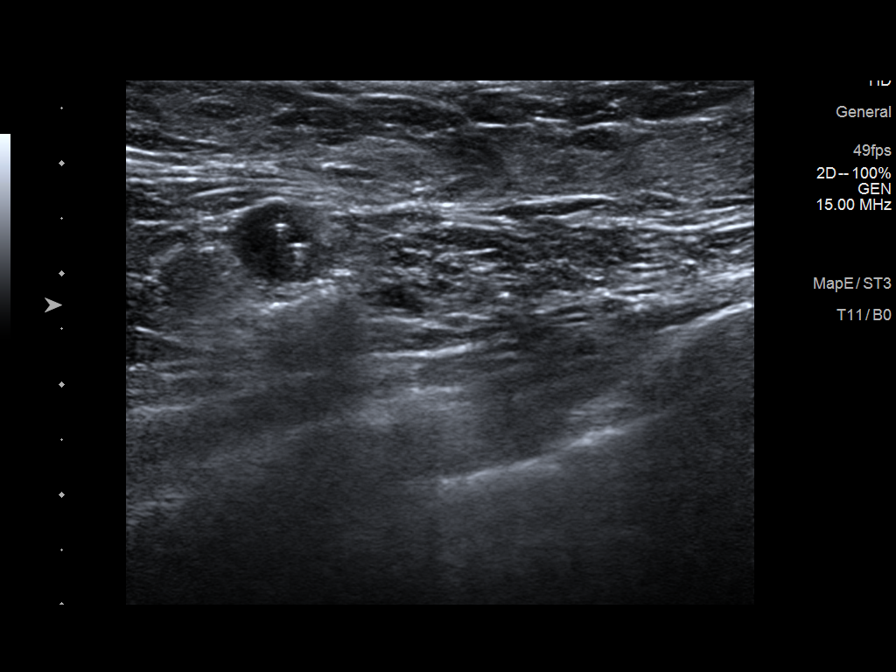

[7 of 7 positions shown; findings below may reference images not displayed]

FINDINGS: Patient presents for radioactive seed localizations prior to right
lumpectomy. I met with the patient and we discussed the procedure of
seed localization including benefits and alternatives. We discussed
the high likelihood of successful procedures. We discussed the risks
of the procedures including infection, bleeding, tissue injury and
further surgery. We discussed the low dose of radioactivity involved
in the procedures. Informed, written consent was given.

The usual time-out protocol was performed immediately prior to the
procedures.

SITE 1: MASS IN THE 12 O'CLOCK POSITION OF THE RIGHT BREAST
CONTAINING A BIOPSY MARKER CLIP

Using ultrasound guidance, sterile technique, 1% lidocaine and an
[[place]] radioactive seed, the mass containing a biopsy marker clip in
the 12 o'clock position of the right breast, 1 cm from the nipple,
was localized using a medial approach. The follow-up mammogram
images confirm the seed in the expected location and were marked for
Dr. GAHONA.

Follow-up survey of the patient confirms presence of the radioactive
seed.

Order number of [[place]] seed:  [PHONE_NUMBER].

Total activity:  0.247 mCi reference Date: [DATE]

SITE 2: MASS IN THE 11 O'CLOCK POSITION OF THE RIGHT BREAST
CONTAINING A BIOPSY MARKER CLIP

Using ultrasound guidance, sterile technique, 1% lidocaine and an
[[place]] radioactive seed, the mass containing a biopsy marker clip in
the 11 o'clock position of the right breast, 3 cm from the nipple,
was localized using a medial approach. The follow-up mammogram
images confirm the seed in the expected location and were marked for
Dr. GAHONA.

Follow-up survey of the patient confirms presence of the radioactive
seed.

Order number of [[place]] seed:  [PHONE_NUMBER].

Total activity:  0.247 mCi reference Date: [DATE]

SITE 3: RIGHT AXILLARY LYMPH NODE CONTAINING A BIOPSY MARKER CLIP

Using ultrasound guidance, sterile technique, 1% lidocaine and an
[[place]] radioactive seed, the abnormal right axillary lymph node
containing a biopsy marker clip was localized using an inferolateral
approach. The follow-up mammogram images confirm the seed in the
expected location and were marked for Dr. GAHONA.

Follow-up survey of the patient confirms presence of the radioactive
seed.

Order number of [[place]] seed:  [PHONE_NUMBER].

Total activity:  0.247 mCi reference Date: [DATE]

The patient tolerated the procedures well and was released from the
[REDACTED]. She was given instructions regarding seed removal.
IMPRESSION: Radioactive seed localization right breast x 2 and right axilla. No
apparent complications.

## 2020-07-26 ENCOUNTER — Ambulatory Visit
Admission: RE | Admit: 2020-07-26 | Discharge: 2020-07-26 | Disposition: A | Discharge status: Home / Self Care | Payer: No Typology Code available for payment source | Source: Ambulatory Visit | Attending: Surgery | Admitting: Surgery

## 2020-07-26 ENCOUNTER — Ambulatory Visit (HOSPITAL_BASED_OUTPATIENT_CLINIC_OR_DEPARTMENT_OTHER): Payer: No Typology Code available for payment source | Admitting: Anesthesiology

## 2020-07-26 ENCOUNTER — Other Ambulatory Visit: Payer: Self-pay

## 2020-07-26 ENCOUNTER — Encounter (HOSPITAL_BASED_OUTPATIENT_CLINIC_OR_DEPARTMENT_OTHER): Payer: Self-pay | Admitting: Surgery

## 2020-07-26 ENCOUNTER — Encounter (HOSPITAL_BASED_OUTPATIENT_CLINIC_OR_DEPARTMENT_OTHER)
Admission: EM | Disposition: A | Discharge status: Home / Self Care | Payer: Self-pay | Source: Home / Self Care | Attending: Surgery

## 2020-07-26 ENCOUNTER — Ambulatory Visit (HOSPITAL_BASED_OUTPATIENT_CLINIC_OR_DEPARTMENT_OTHER)
Admission: EM | Admit: 2020-07-26 | Discharge: 2020-07-26 | Disposition: A | Discharge status: Home / Self Care | Payer: No Typology Code available for payment source | Attending: Surgery | Admitting: Surgery

## 2020-07-26 ENCOUNTER — Ambulatory Visit (HOSPITAL_COMMUNITY)
Admission: RE | Admit: 2020-07-26 | Discharge: 2020-07-26 | Disposition: A | Discharge status: Home / Self Care | Payer: No Typology Code available for payment source | Source: Ambulatory Visit | Attending: Surgery | Admitting: Surgery

## 2020-07-26 DIAGNOSIS — C50411 Malignant neoplasm of upper-outer quadrant of right female breast: Secondary | ICD-10-CM | POA: Insufficient documentation

## 2020-07-26 DIAGNOSIS — C50911 Malignant neoplasm of unspecified site of right female breast: Secondary | ICD-10-CM

## 2020-07-26 DIAGNOSIS — C773 Secondary and unspecified malignant neoplasm of axilla and upper limb lymph nodes: Secondary | ICD-10-CM | POA: Insufficient documentation

## 2020-07-26 DIAGNOSIS — E119 Type 2 diabetes mellitus without complications: Secondary | ICD-10-CM | POA: Insufficient documentation

## 2020-07-26 DIAGNOSIS — Z882 Allergy status to sulfonamides status: Secondary | ICD-10-CM | POA: Insufficient documentation

## 2020-07-26 DIAGNOSIS — Z8249 Family history of ischemic heart disease and other diseases of the circulatory system: Secondary | ICD-10-CM | POA: Insufficient documentation

## 2020-07-26 DIAGNOSIS — Z170 Estrogen receptor positive status [ER+]: Secondary | ICD-10-CM | POA: Insufficient documentation

## 2020-07-26 DIAGNOSIS — Z9221 Personal history of antineoplastic chemotherapy: Secondary | ICD-10-CM | POA: Insufficient documentation

## 2020-07-26 DIAGNOSIS — Z7984 Long term (current) use of oral hypoglycemic drugs: Secondary | ICD-10-CM | POA: Insufficient documentation

## 2020-07-26 DIAGNOSIS — Z79899 Other long term (current) drug therapy: Secondary | ICD-10-CM | POA: Insufficient documentation

## 2020-07-26 DIAGNOSIS — Z833 Family history of diabetes mellitus: Secondary | ICD-10-CM | POA: Insufficient documentation

## 2020-07-26 HISTORY — PX: BREAST LUMPECTOMY WITH RADIOACTIVE SEED AND SENTINEL LYMPH NODE BIOPSY: SHX6550

## 2020-07-26 HISTORY — PX: BREAST LUMPECTOMY: SHX2

## 2020-07-26 HISTORY — PX: PORT-A-CATH REMOVAL: SHX5289

## 2020-07-26 HISTORY — DX: Malignant (primary) neoplasm, unspecified: C80.1

## 2020-07-26 LAB — GLUCOSE, CAPILLARY
Glucose-Capillary: 98 mg/dL (ref 70–99)
Glucose-Capillary: 97 mg/dL (ref 70–99)
Glucose-Capillary: 133 mg/dL — ABNORMAL HIGH (ref 70–99)

## 2020-07-26 IMAGING — DX MM BREAST SURGICAL SPECIMEN
1 series · 2 of 2 positions shown · non-contrast
Comparison: Previous exam(s).

CLINICAL DATA: Status post right breast lumpectomy.

EXAM:
SPECIMEN RADIOGRAPH OF THE RIGHT BREAST

[Series 2: specimen digital x-ray, derived · right · 0.07mm/px · 2 of 2 slices shown]
[im 1/2]
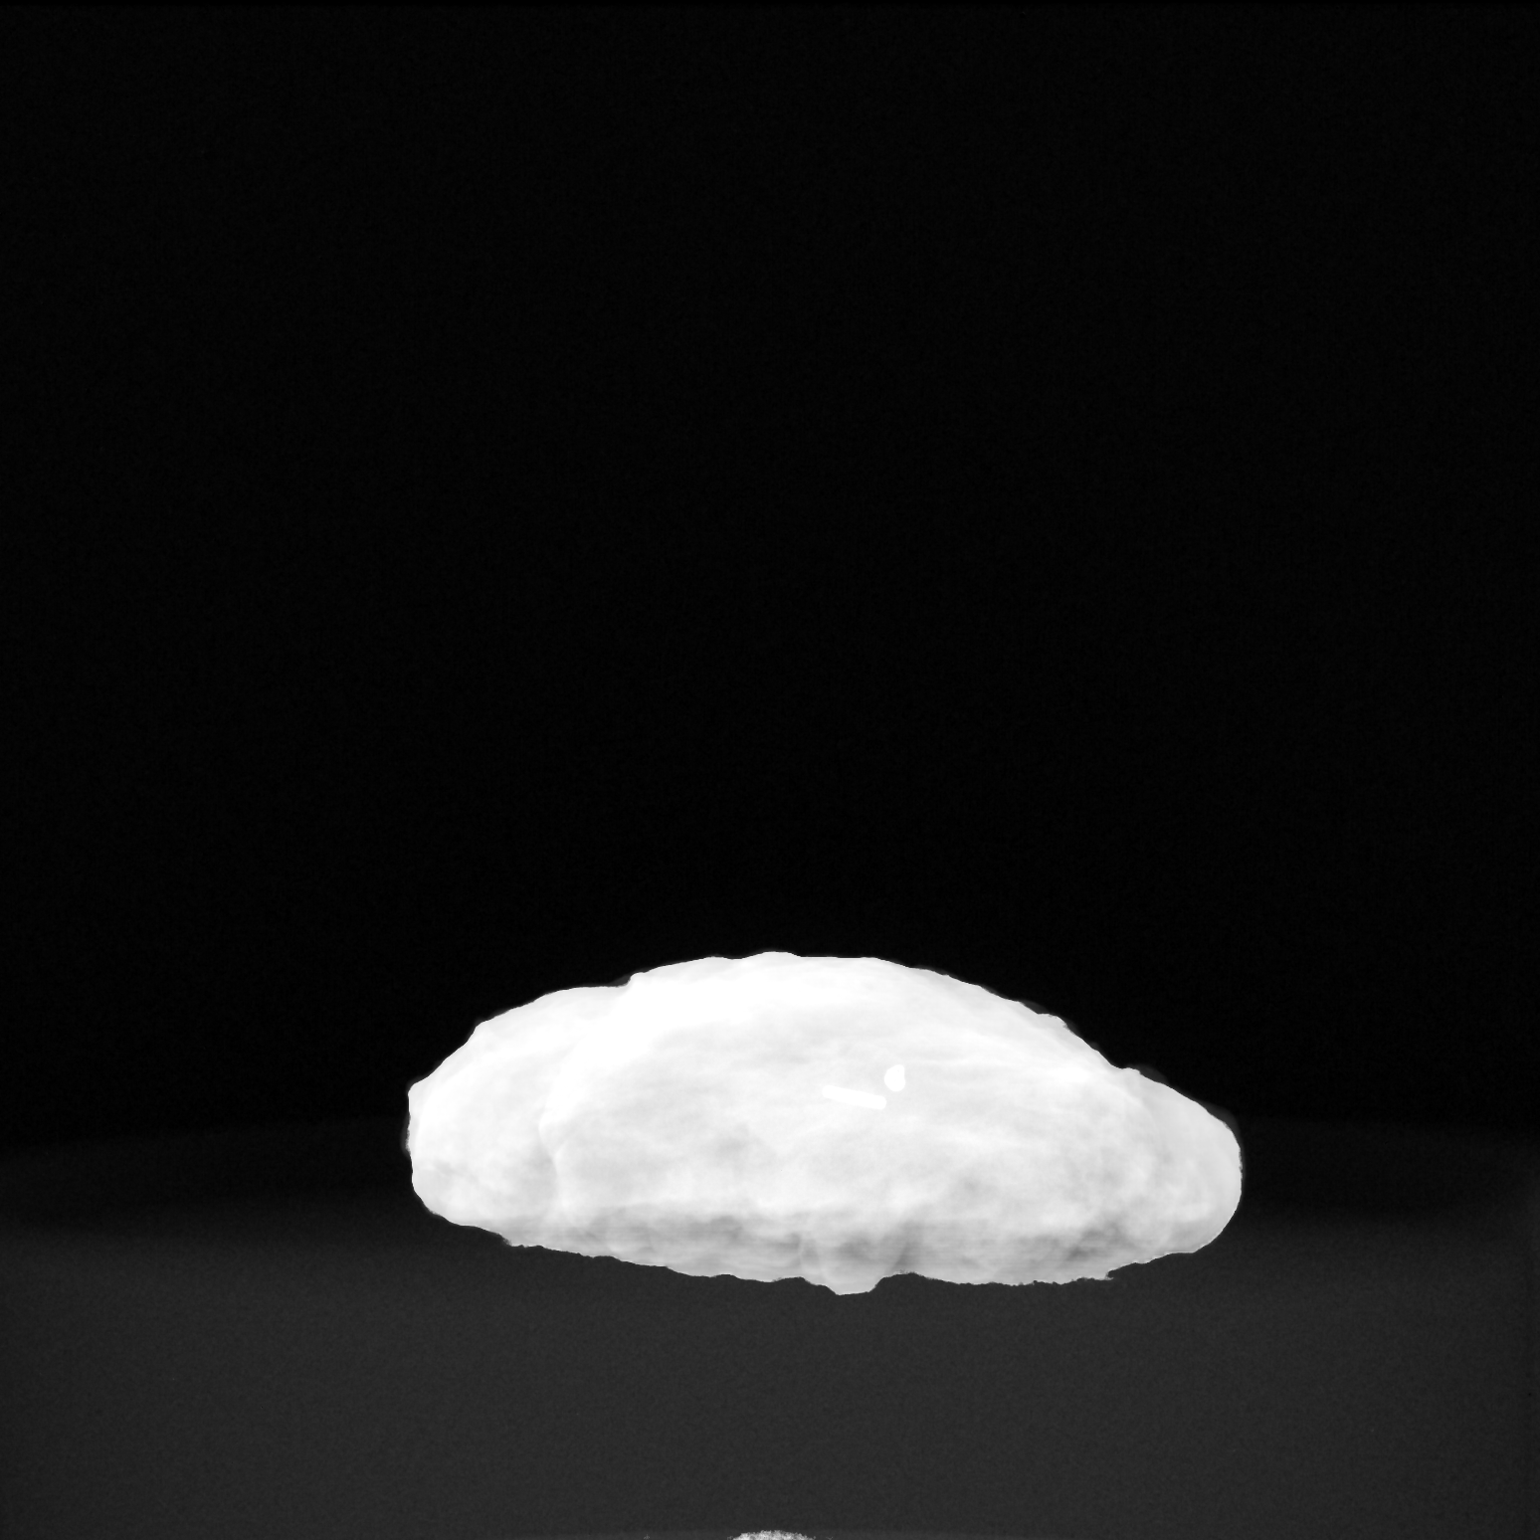
[im 2/2]
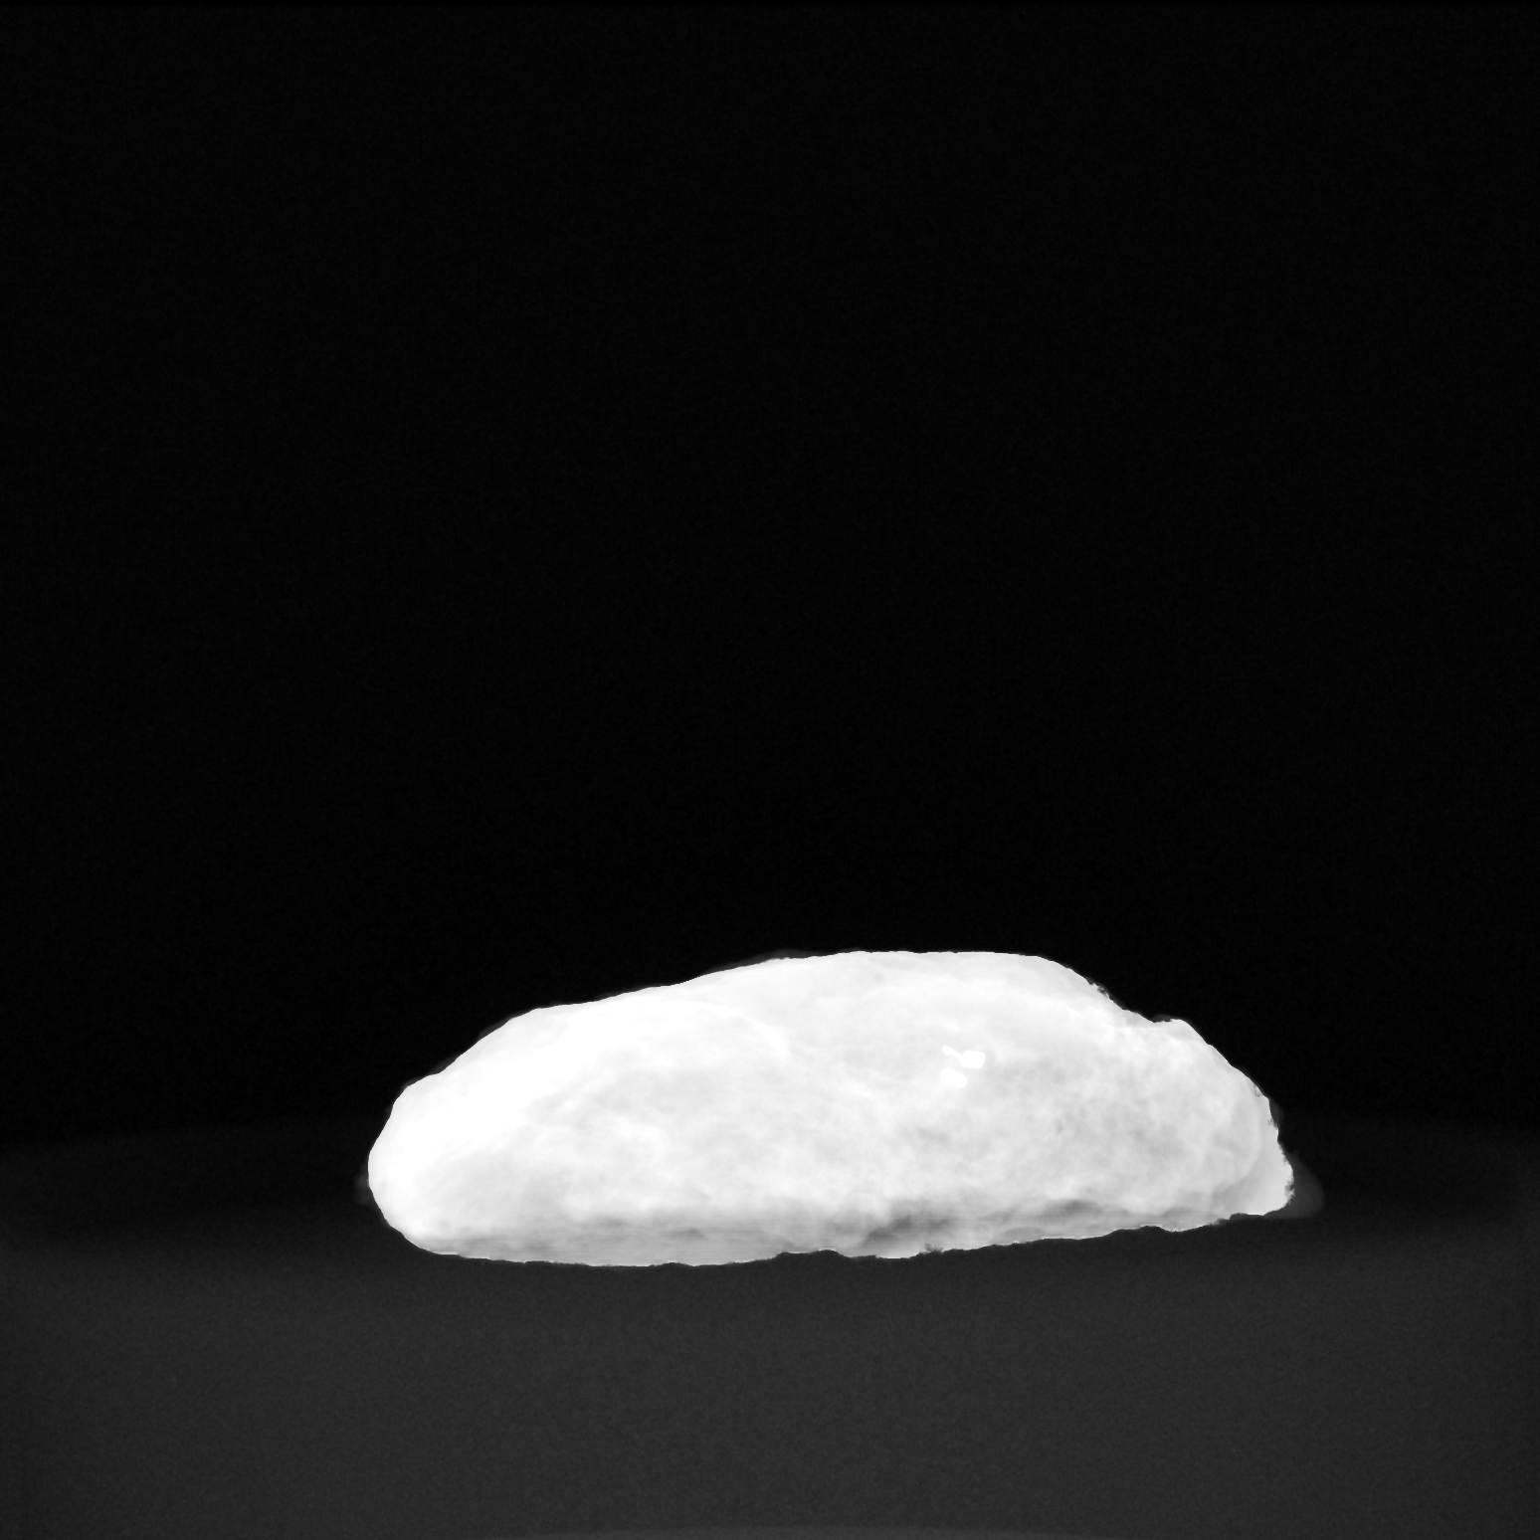

[2 of 2 positions shown; findings below may reference images not displayed]

FINDINGS: Status post excision of the right breast. The radioactive seed and
biopsy marker clip are present and completely intact.
IMPRESSION: Specimen radiograph of the right breast.

## 2020-07-26 IMAGING — DX MM BREAST SURGICAL SPECIMEN
1 series · 2 of 2 positions shown · non-contrast
Comparison: Previous exam(s).

CLINICAL DATA: Patient status post right lumpectomy and node
excision.

EXAM:
SPECIMEN RADIOGRAPH OF THE RIGHT BREAST

[Series 2: specimen digital x-ray, derived · right · 0.07mm/px · 2 of 2 slices shown]
[im 1/2]
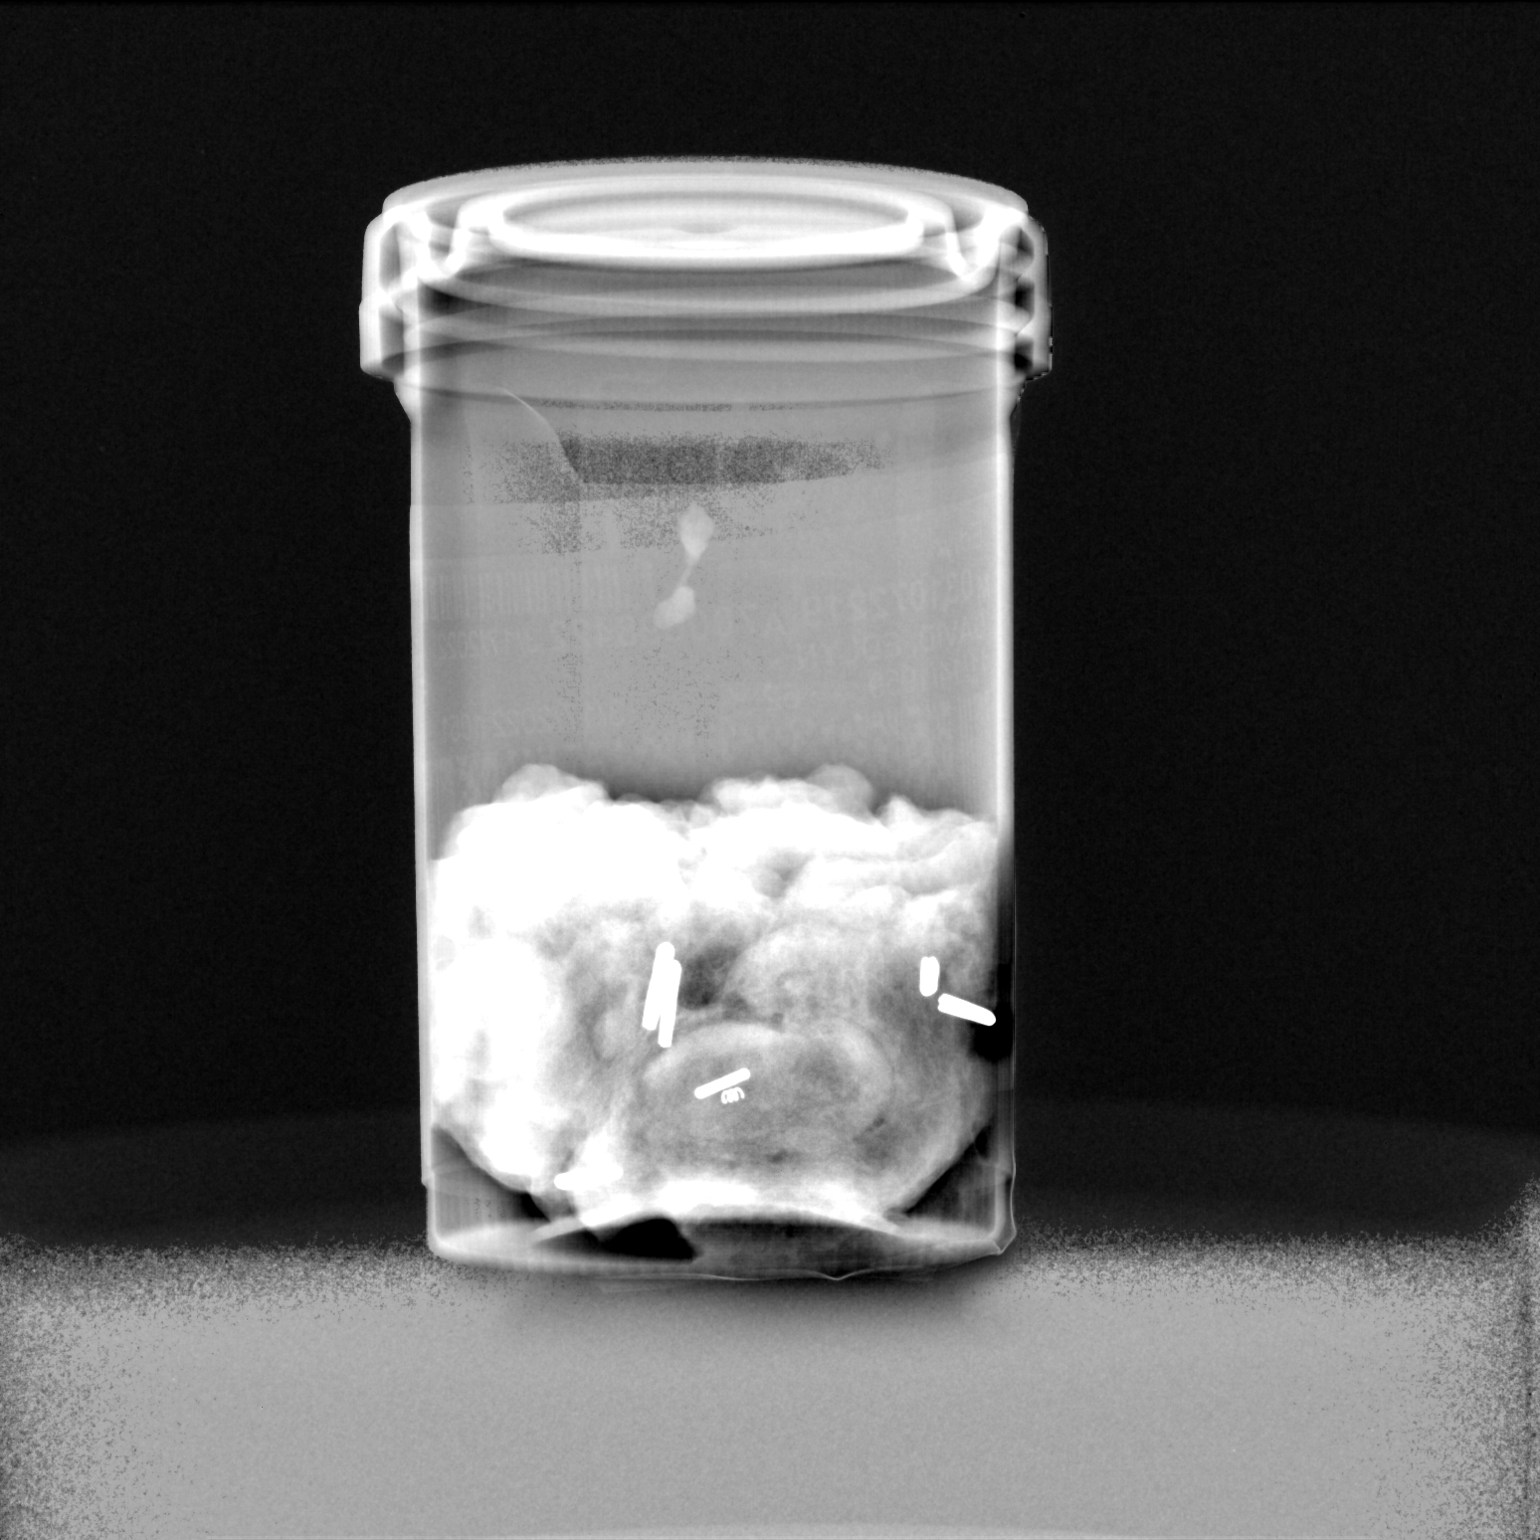
[im 2/2]
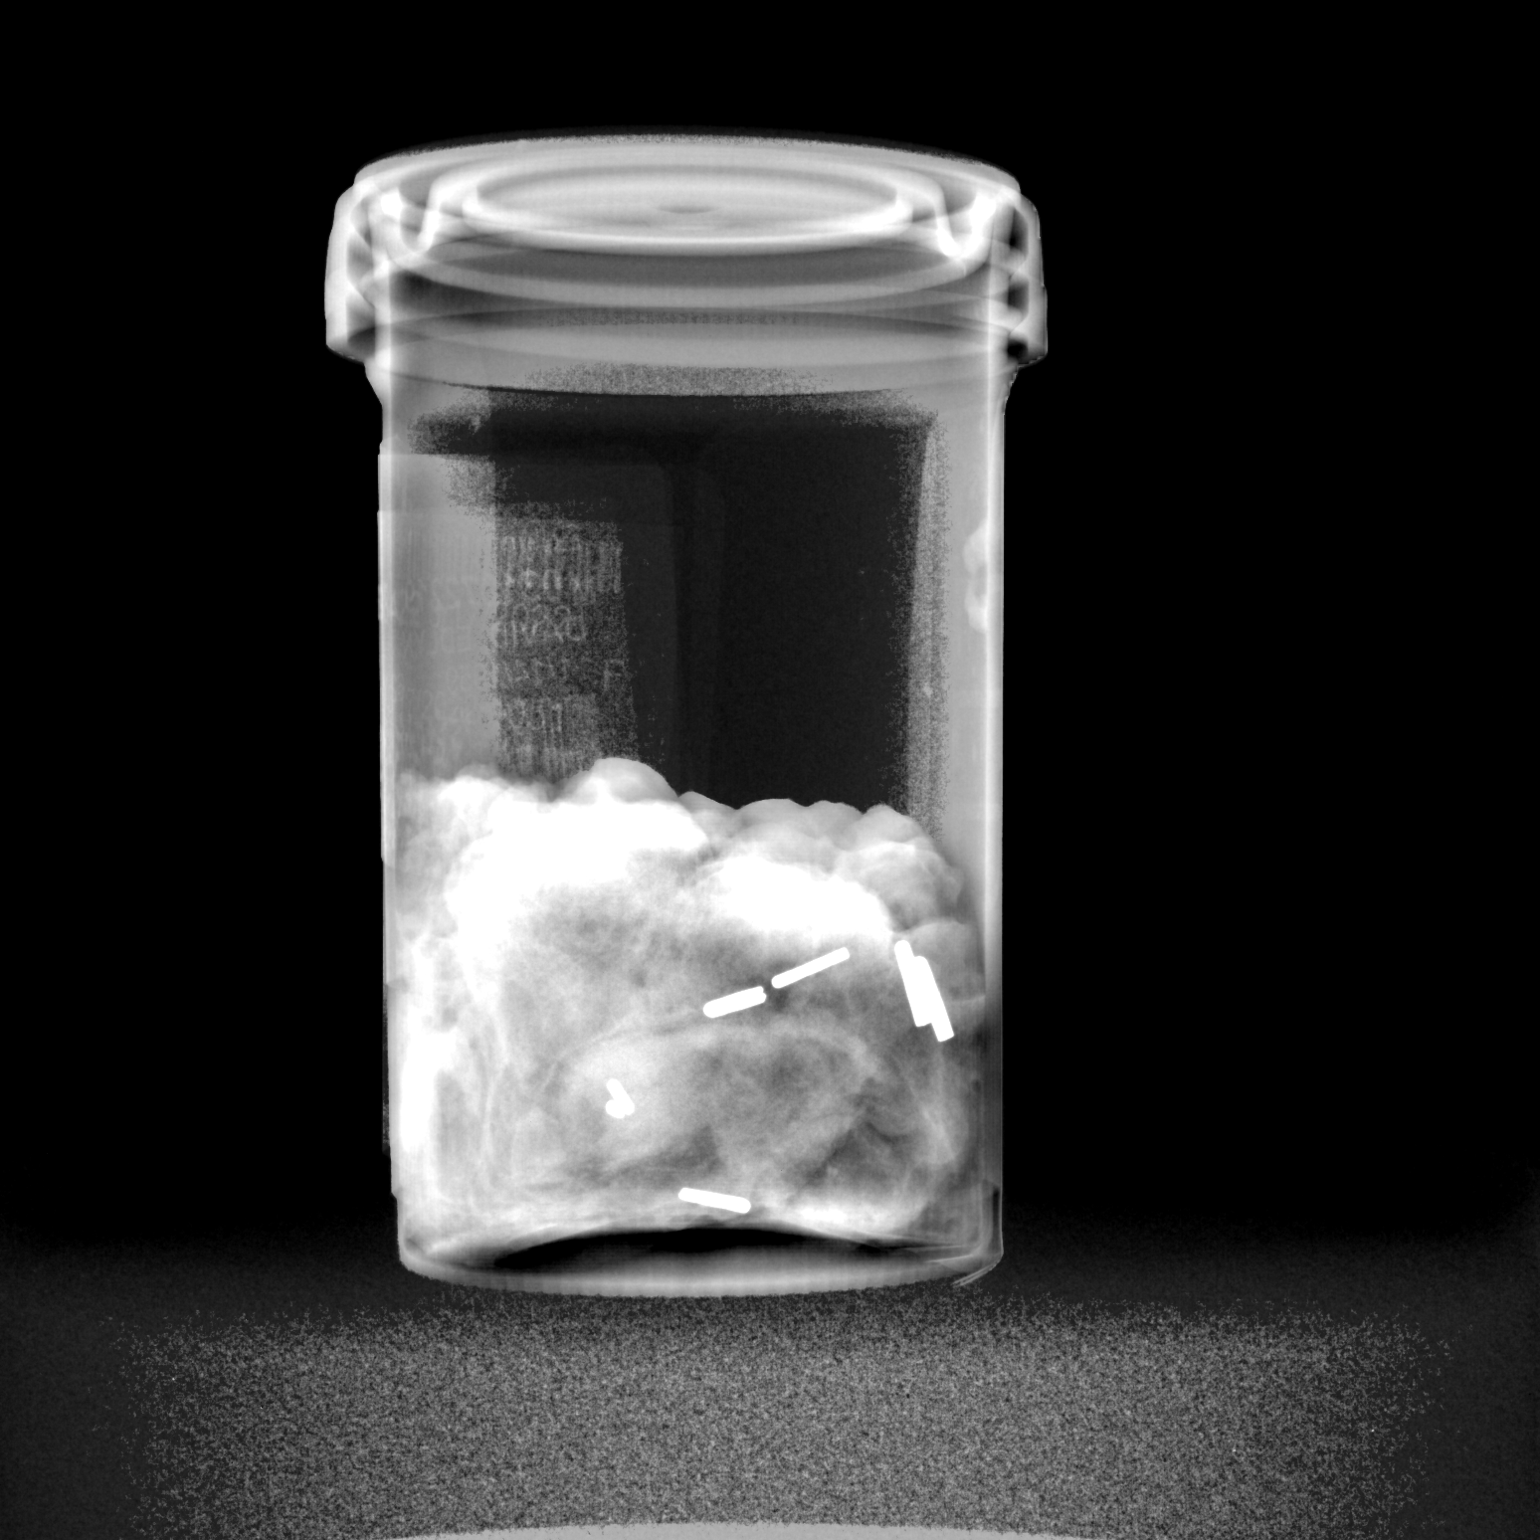

[2 of 2 positions shown; findings below may reference images not displayed]

FINDINGS: Status post excision of the right breast. The radioactive seed and
biopsy marker clips are present and completely intact.
IMPRESSION: Specimen radiograph of the right breast.

## 2020-07-26 IMAGING — MG MM BREAST SURGICAL SPECIMEN
1 series · 2 of 2 positions shown · non-contrast
Comparison: Previous exam(s).

CLINICAL DATA: Patient status post right breast lumpectomy.

EXAM:
SPECIMEN RADIOGRAPH OF THE RIGHT BREAST

[Series 2: R · right · 0.07mm/px · 2 of 2 slices shown]
[im 1/2]
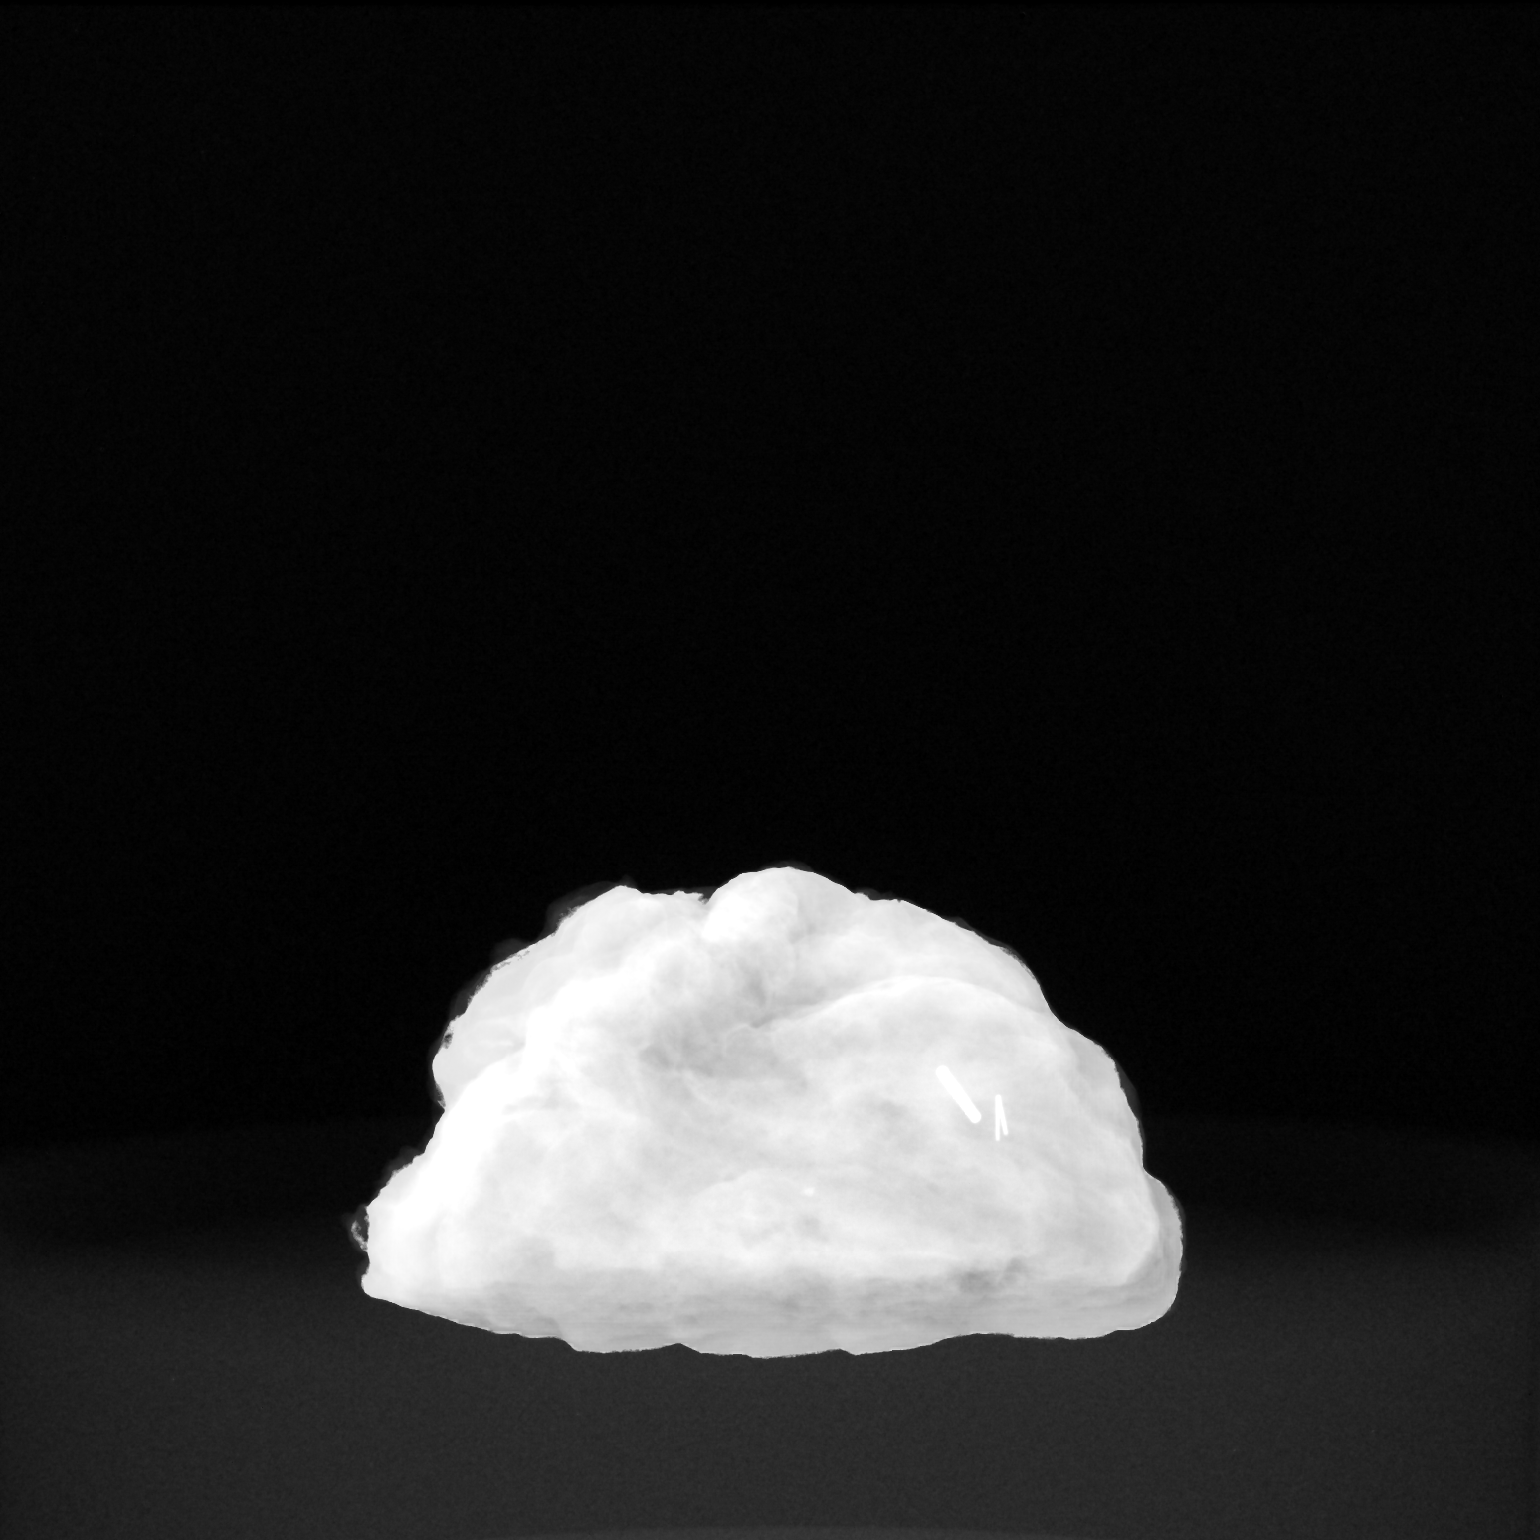
[im 2/2]
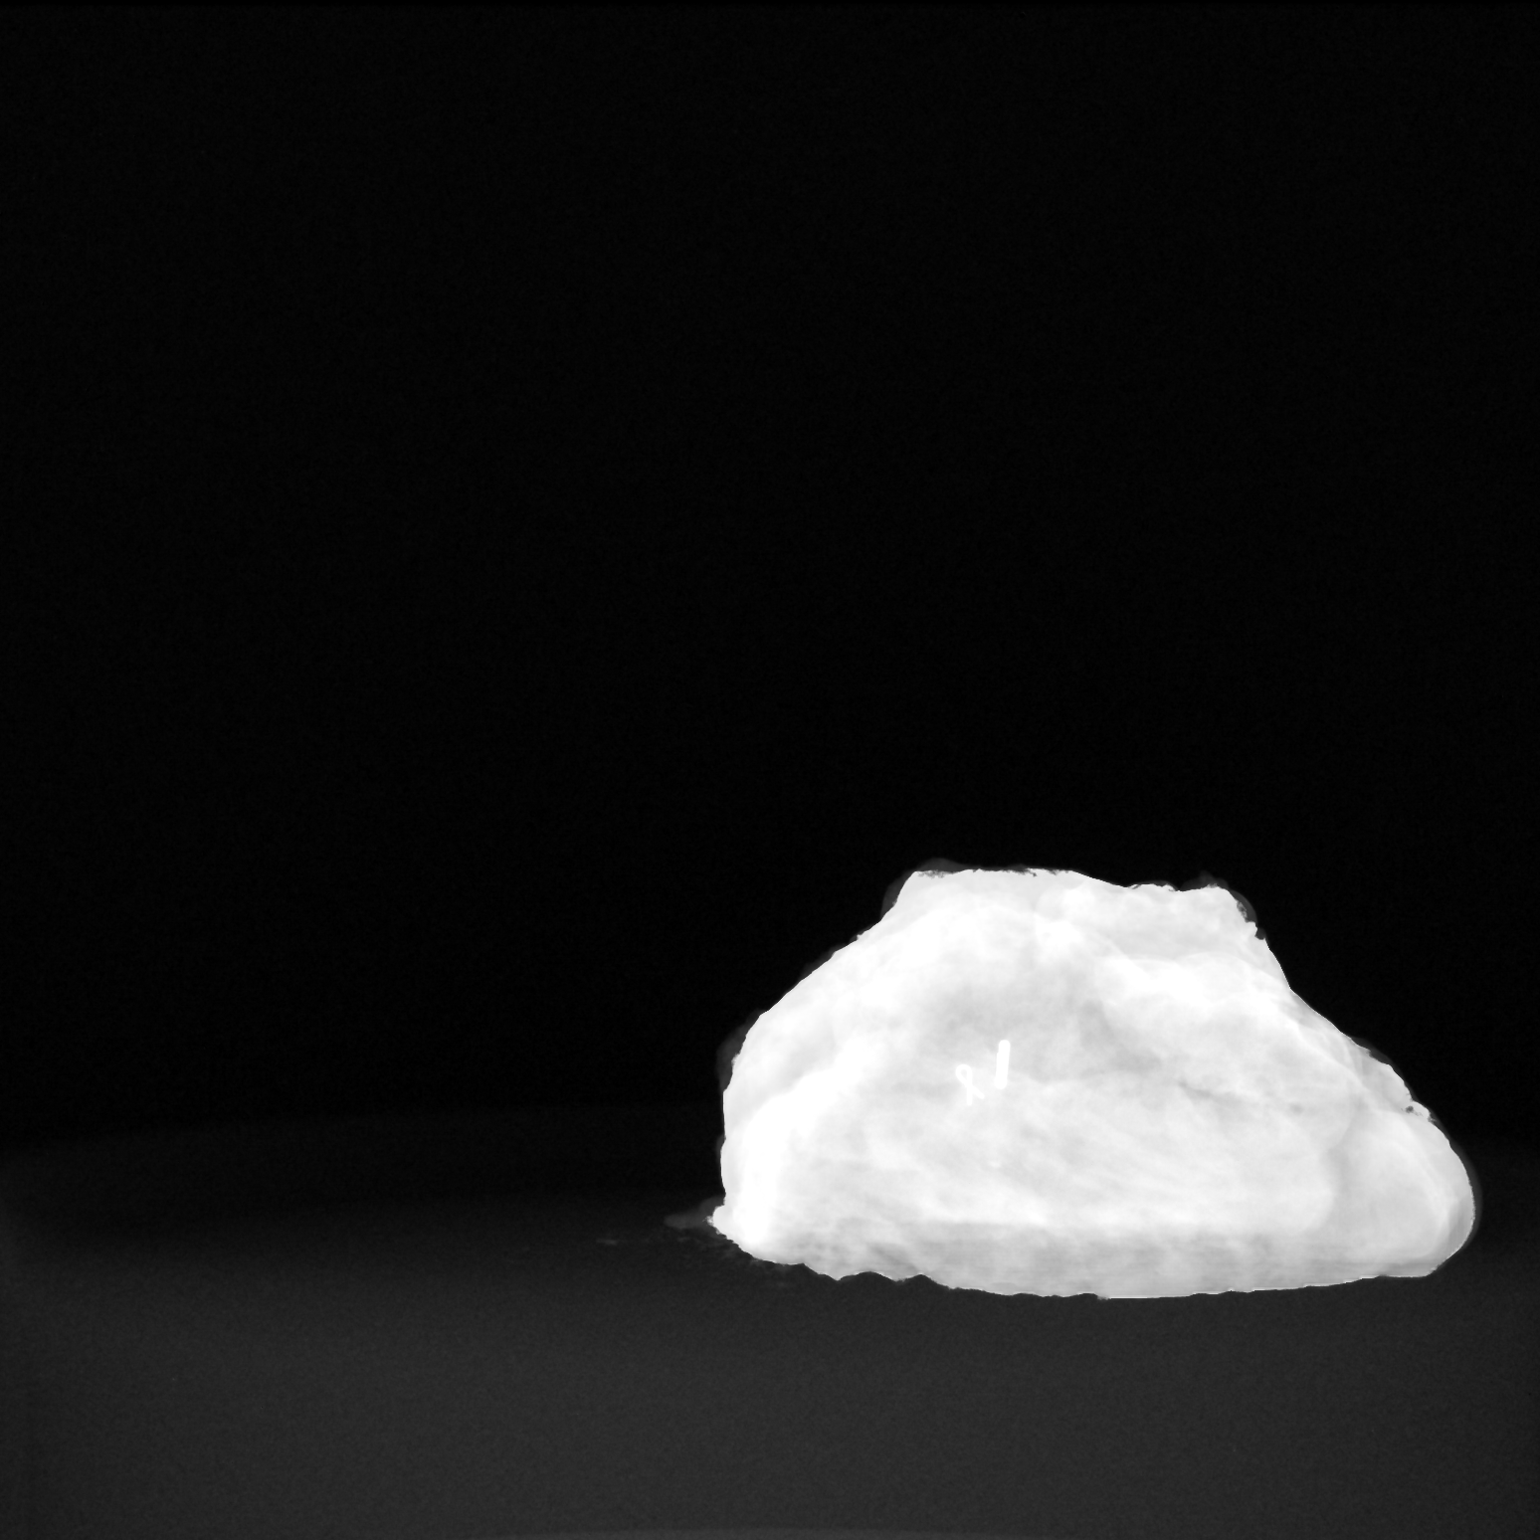

[2 of 2 positions shown; findings below may reference images not displayed]

FINDINGS: Status post excision of the right breast. The radioactive seed and
biopsy marker clip are present and completely intact.
IMPRESSION: Specimen radiograph of the right breast.

## 2020-07-26 SURGERY — REMOVAL PORT-A-CATH
Anesthesia: General | Site: Breast | Laterality: Right

## 2020-07-26 MED ORDER — CEFAZOLIN SODIUM-DEXTROSE 2-4 GM/100ML-% IV SOLN
INTRAVENOUS | Status: AC
  Filled 2020-07-26: qty 100

## 2020-07-26 MED ORDER — SODIUM CHLORIDE (PF) 0.9 % IJ SOLN
INTRAVENOUS | Status: DC | PRN
  Administered 2020-07-26: 3 mL via SUBCUTANEOUS

## 2020-07-26 MED ORDER — SUCCINYLCHOLINE CHLORIDE 20 MG/ML IJ SOLN
INTRAMUSCULAR | Status: DC | PRN
  Administered 2020-07-26: 140 mg via INTRAVENOUS

## 2020-07-26 MED ORDER — MIDAZOLAM HCL 2 MG/2ML IJ SOLN
2.0000 mg | Freq: Once | INTRAMUSCULAR | Status: AC
  Administered 2020-07-26: 1 mg via INTRAVENOUS

## 2020-07-26 MED ORDER — PHENYLEPHRINE 40 MCG/ML (10ML) SYRINGE FOR IV PUSH (FOR BLOOD PRESSURE SUPPORT)
PREFILLED_SYRINGE | INTRAVENOUS | Status: AC
  Filled 2020-07-26: qty 10

## 2020-07-26 MED ORDER — DEXTROSE 5 % IV SOLN
3.0000 g | INTRAVENOUS | Status: AC
  Administered 2020-07-26: 2 g via INTRAVENOUS
  Filled 2020-07-26: qty 3000

## 2020-07-26 MED ORDER — FENTANYL CITRATE (PF) 100 MCG/2ML IJ SOLN: 25.0000 ug | INTRAMUSCULAR | Status: DC | PRN

## 2020-07-26 MED ORDER — FENTANYL CITRATE (PF) 100 MCG/2ML IJ SOLN
INTRAMUSCULAR | Status: DC | PRN
  Administered 2020-07-26: 50 ug via INTRAVENOUS
  Administered 2020-07-26: 25 ug via INTRAVENOUS
  Administered 2020-07-26 (×2): 50 ug via INTRAVENOUS
  Administered 2020-07-26: 25 ug via INTRAVENOUS

## 2020-07-26 MED ORDER — FENTANYL CITRATE (PF) 100 MCG/2ML IJ SOLN
100.0000 ug | Freq: Once | INTRAMUSCULAR | Status: AC
  Administered 2020-07-26: 50 ug via INTRAVENOUS

## 2020-07-26 MED ORDER — ONDANSETRON HCL 4 MG/2ML IJ SOLN
INTRAMUSCULAR | Status: AC
  Filled 2020-07-26: qty 2

## 2020-07-26 MED ORDER — LIDOCAINE 2% (20 MG/ML) 5 ML SYRINGE
INTRAMUSCULAR | Status: AC
  Filled 2020-07-26: qty 5

## 2020-07-26 MED ORDER — FENTANYL CITRATE (PF) 100 MCG/2ML IJ SOLN
INTRAMUSCULAR | Status: AC
  Filled 2020-07-26: qty 2

## 2020-07-26 MED ORDER — MIDAZOLAM HCL 2 MG/2ML IJ SOLN: 2.0000 mg | Freq: Once | INTRAMUSCULAR | Status: DC

## 2020-07-26 MED ORDER — TECHNETIUM TC 99M TILMANOCEPT KIT
1.0000 | PACK | Freq: Once | INTRAVENOUS | Status: AC | PRN
  Administered 2020-07-26: 1 via INTRADERMAL

## 2020-07-26 MED ORDER — ONDANSETRON HCL 4 MG/2ML IJ SOLN
INTRAMUSCULAR | Status: DC | PRN
  Administered 2020-07-26: 4 mg via INTRAVENOUS

## 2020-07-26 MED ORDER — IBUPROFEN 800 MG PO TABS: 800.0000 mg | ORAL_TABLET | Freq: Three times a day (TID) | ORAL | 0 refills | Status: DC | PRN

## 2020-07-26 MED ORDER — CHLORHEXIDINE GLUCONATE CLOTH 2 % EX PADS: 6.0000 | MEDICATED_PAD | Freq: Once | CUTANEOUS | Status: DC

## 2020-07-26 MED ORDER — BUPIVACAINE-EPINEPHRINE 0.25% -1:200000 IJ SOLN
INTRAMUSCULAR | Status: DC | PRN
  Administered 2020-07-26: 10 mL

## 2020-07-26 MED ORDER — ROCURONIUM BROMIDE 100 MG/10ML IV SOLN
INTRAVENOUS | Status: DC | PRN
  Administered 2020-07-26: 30 mg via INTRAVENOUS
  Administered 2020-07-26: 10 mg via INTRAVENOUS

## 2020-07-26 MED ORDER — ROCURONIUM BROMIDE 10 MG/ML (PF) SYRINGE
PREFILLED_SYRINGE | INTRAVENOUS | Status: AC
  Filled 2020-07-26: qty 10

## 2020-07-26 MED ORDER — SUGAMMADEX SODIUM 200 MG/2ML IV SOLN
INTRAVENOUS | Status: DC | PRN
  Administered 2020-07-26: 200 mg via INTRAVENOUS

## 2020-07-26 MED ORDER — EPHEDRINE SULFATE 50 MG/ML IJ SOLN
INTRAMUSCULAR | Status: DC | PRN
  Administered 2020-07-26: 10 mg via INTRAVENOUS

## 2020-07-26 MED ORDER — LACTATED RINGERS IV SOLN: INTRAVENOUS | Status: DC

## 2020-07-26 MED ORDER — DEXAMETHASONE SODIUM PHOSPHATE 4 MG/ML IJ SOLN
INTRAMUSCULAR | Status: DC | PRN
  Administered 2020-07-26: 5 mg via INTRAVENOUS

## 2020-07-26 MED ORDER — PROPOFOL 10 MG/ML IV BOLUS
INTRAVENOUS | Status: AC
  Filled 2020-07-26: qty 20

## 2020-07-26 MED ORDER — DEXAMETHASONE SODIUM PHOSPHATE 10 MG/ML IJ SOLN
INTRAMUSCULAR | Status: AC
  Filled 2020-07-26: qty 1

## 2020-07-26 MED ORDER — SODIUM CHLORIDE 0.9 % IV SOLN
INTRAVENOUS | Status: AC
  Filled 2020-07-26: qty 10

## 2020-07-26 MED ORDER — OXYCODONE HCL 5 MG PO TABS: 5.0000 mg | ORAL_TABLET | Freq: Four times a day (QID) | ORAL | 0 refills | Status: DC | PRN

## 2020-07-26 MED ORDER — MIDAZOLAM HCL 2 MG/2ML IJ SOLN
INTRAMUSCULAR | Status: AC
  Filled 2020-07-26: qty 2

## 2020-07-26 MED ORDER — PHENYLEPHRINE HCL (PRESSORS) 10 MG/ML IV SOLN
INTRAVENOUS | Status: DC | PRN
  Administered 2020-07-26: 120 ug via INTRAVENOUS
  Administered 2020-07-26 (×2): 80 ug via INTRAVENOUS
  Administered 2020-07-26: 120 ug via INTRAVENOUS

## 2020-07-26 MED ORDER — EPHEDRINE 5 MG/ML INJ
INTRAVENOUS | Status: AC
  Filled 2020-07-26: qty 10

## 2020-07-26 MED ORDER — PROPOFOL 10 MG/ML IV BOLUS
INTRAVENOUS | Status: DC | PRN
  Administered 2020-07-26: 150 mg via INTRAVENOUS
  Administered 2020-07-26: 40 mg via INTRAVENOUS
  Administered 2020-07-26: 30 mg via INTRAVENOUS
  Administered 2020-07-26: 20 mg via INTRAVENOUS

## 2020-07-26 MED ORDER — SUCCINYLCHOLINE CHLORIDE 200 MG/10ML IV SOSY
PREFILLED_SYRINGE | INTRAVENOUS | Status: AC
  Filled 2020-07-26: qty 10

## 2020-07-26 MED ORDER — LIDOCAINE HCL (CARDIAC) PF 100 MG/5ML IV SOSY
PREFILLED_SYRINGE | INTRAVENOUS | Status: DC | PRN
  Administered 2020-07-26: 100 mg via INTRAVENOUS

## 2020-07-26 SURGICAL SUPPLY — 59 items
ADH SKN CLS APL DERMABOND .7 (GAUZE/BANDAGES/DRESSINGS) ×4
APL PRP STRL LF DISP 70% ISPRP (MISCELLANEOUS) ×2
APL SKNCLS STERI-STRIP NONHPOA (GAUZE/BANDAGES/DRESSINGS)
APPLIER CLIP 9.375 MED OPEN (MISCELLANEOUS) ×9
APR CLP MED 9.3 20 MLT OPN (MISCELLANEOUS) ×6
BENZOIN TINCTURE PRP APPL 2/3 (GAUZE/BANDAGES/DRESSINGS) IMPLANT
BINDER BREAST LRG (GAUZE/BANDAGES/DRESSINGS) IMPLANT
BINDER BREAST MEDIUM (GAUZE/BANDAGES/DRESSINGS) IMPLANT
BINDER BREAST XLRG (GAUZE/BANDAGES/DRESSINGS) ×3 IMPLANT
BINDER BREAST XXLRG (GAUZE/BANDAGES/DRESSINGS) IMPLANT
BIOPATCH RED 1 DISK 7.0 (GAUZE/BANDAGES/DRESSINGS) ×3 IMPLANT
BLADE SURG 15 STRL LF DISP TIS (BLADE) ×2 IMPLANT
BLADE SURG 15 STRL SS (BLADE) ×3
CANISTER SUC SOCK COL 7IN (MISCELLANEOUS) IMPLANT
CANISTER SUCT 1200ML W/VALVE (MISCELLANEOUS) ×3 IMPLANT
CHLORAPREP W/TINT 26 (MISCELLANEOUS) ×3 IMPLANT
CLIP APPLIE 9.375 MED OPEN (MISCELLANEOUS) ×6 IMPLANT
COVER BACK TABLE 60X90IN (DRAPES) ×3 IMPLANT
COVER MAYO STAND STRL (DRAPES) ×3 IMPLANT
COVER PROBE W GEL 5X96 (DRAPES) ×3 IMPLANT
COVER WAND RF STERILE (DRAPES) IMPLANT
DECANTER SPIKE VIAL GLASS SM (MISCELLANEOUS) ×3 IMPLANT
DERMABOND ADVANCED (GAUZE/BANDAGES/DRESSINGS) ×2
DERMABOND ADVANCED .7 DNX12 (GAUZE/BANDAGES/DRESSINGS) ×4 IMPLANT
DRAIN CHANNEL 19F RND (DRAIN) ×3 IMPLANT
DRAPE LAPAROSCOPIC ABDOMINAL (DRAPES) ×3 IMPLANT
DRAPE LAPAROTOMY 100X72 PEDS (DRAPES) ×3 IMPLANT
DRAPE UTILITY XL STRL (DRAPES) ×3 IMPLANT
ELECT COATED BLADE 2.86 ST (ELECTRODE) ×3 IMPLANT
ELECT REM PT RETURN 9FT ADLT (ELECTROSURGICAL) ×3
ELECTRODE REM PT RTRN 9FT ADLT (ELECTROSURGICAL) ×2 IMPLANT
GLOVE SRG 8 PF TXTR STRL LF DI (GLOVE) ×2 IMPLANT
GLOVE SURG LTX SZ8 (GLOVE) ×3 IMPLANT
GLOVE SURG UNDER POLY LF SZ8 (GLOVE) ×3
GOWN STRL REUS W/ TWL LRG LVL3 (GOWN DISPOSABLE) ×4 IMPLANT
GOWN STRL REUS W/ TWL XL LVL3 (GOWN DISPOSABLE) ×2 IMPLANT
GOWN STRL REUS W/TWL LRG LVL3 (GOWN DISPOSABLE) ×6
GOWN STRL REUS W/TWL XL LVL3 (GOWN DISPOSABLE) ×3
HEMOSTAT ARISTA ABSORB 3G PWDR (HEMOSTASIS) ×3 IMPLANT
HEMOSTAT SNOW SURGICEL 2X4 (HEMOSTASIS) IMPLANT
KIT MARKER MARGIN INK (KITS) ×3 IMPLANT
NDL SAFETY ECLIPSE 18X1.5 (NEEDLE) IMPLANT
NEEDLE HYPO 18GX1.5 SHARP (NEEDLE)
NEEDLE HYPO 25X1 1.5 SAFETY (NEEDLE) ×3 IMPLANT
NS IRRIG 1000ML POUR BTL (IV SOLUTION) ×3 IMPLANT
PACK BASIN DAY SURGERY FS (CUSTOM PROCEDURE TRAY) ×3 IMPLANT
PENCIL SMOKE EVACUATOR (MISCELLANEOUS) ×3 IMPLANT
SLEEVE SCD COMPRESS KNEE MED (STOCKING) ×3 IMPLANT
SPONGE LAP 4X18 RFD (DISPOSABLE) ×9 IMPLANT
STRIP CLOSURE SKIN 1/2X4 (GAUZE/BANDAGES/DRESSINGS) IMPLANT
SUT ETHILON 2 0 FSLX (SUTURE) ×3 IMPLANT
SUT MNCRL AB 4-0 PS2 18 (SUTURE) ×6 IMPLANT
SUT MON AB 4-0 PC3 18 (SUTURE) IMPLANT
SUT VICRYL 3-0 CR8 SH (SUTURE) ×6 IMPLANT
SYR CONTROL 10ML LL (SYRINGE) ×3 IMPLANT
TOWEL GREEN STERILE FF (TOWEL DISPOSABLE) ×3 IMPLANT
TRAY FAXITRON CT DISP (TRAY / TRAY PROCEDURE) ×3 IMPLANT
TUBE CONNECTING 20X1/4 (TUBING) ×3 IMPLANT
YANKAUER SUCT BULB TIP NO VENT (SUCTIONS) ×3 IMPLANT

## 2020-07-26 NOTE — Op Note (Signed)
Preoperative diagnosis: Multifocal right breast cancer stage III  Postoperative diagnosis: Same  Procedure: Right breast seed lumpectomy x2 with targeted right axillary sentinel lymph node mapping, sentinel lymph node mapping and completion right axillary lymph node dissection and port removal  Surgeon: Erroll Luna, MD  Anesthesia: General with pectoral block local anesthetic with 0.25% Marcaine plain  EBL: 30 cc  Drains: 19 round drain to the axilla  Specimen: Right breast masses x2 with seeds and clips in both specimens verified by the Faxitron as well as right axillary targeted node with seed and clip with right axillary lymph node basin  Indications for procedure: The patient is a 65 year old female with a history of right breast cancer.  She underwent neoadjuvant chemotherapy.  She presents for breast conserving surgery and evaluation of her right axilla as well as port removal.  Risks and benefits of surgery were discussed.  Complications of surgery were discussed.The procedure has been discussed with the patient. Alternatives to surgery have been discussed with the patient.  Risks of surgery include bleeding,  Infection,  Seroma formation, death,  and the need for further surgery.   The patient understands and wishes to proceed.Sentinel lymph node mapping and dissection has been discussed with the patient.  Risk of bleeding, lymphedema infection,  Seroma formation,  Additional procedures,,  Shoulder weakness ,  Shoulder stiffness,  Nerve and blood vessel injury and reaction to the mapping dyes have been discussed.  Alternatives to surgery have been discussed with the patient.  The patient agrees to proceed.   Description of procedure: The patient was met in the holding area and questions were answered.  She had 2 seeds placed in both mass lesions in the right which are in the right upper outer quadrant.  She had another seed placed into the lymph node in the right axilla that was  biopsied prior to chemotherapy was positive.  She underwent injection with technetium sulfur colloid per radiology protocol.  The neoprobe was used to verify  that all seeds were present.  All questions were answered.  She also had a pectoral block placed by anesthesia.  She was then taken back to the operative room.  She is placed supine upon the OR table.  After induction of general anesthesia, the upper chest regions were prepped and draped in a sterile fashion bilaterally.  Timeout was done.  She received appropriate preoperative antibiotics.  The neoprobe was used to identify both seeds in the right breast upper outer quadrant.  A circumareolar incision was made.  Dissection was carried down to the first more proximal lesion was resected.  This was at about 9:00.  All tissue around the seed and clip were excised.  The margins appear grossly negative.  The same incision, we were able to access the lesion at 11:00.  We dissected down to remove the entire lesion in its entirety.  Both the seed and clip were present.  This constituted one lumpectomy cavity, therefore, I excised all the margins as one which would encompass both lesions.  Irrigation was used and hemostasis achieved with cautery.  Clips were placed.  The wound was closed with a deep layer 3-0 Vicryl and 4 Monocryl for the skin in a subcuticular fashion.  4 cc of methylene blue dye were injected in a subareolar position.  The patient was rolled to her left.  Neoprobe was used to identify the hotspot in the right axilla.  The dye was massaged for 5 minutes.  Incision was then made  in the right axilla.  Neoprobe used to identify the seed that was contained in the initial positive lymph node.  Upon inspection this was a dissected out.  There is no blue dye in the axilla.  There are multiple sentinel nodes that were hot from the technetium.  There are multiple abnormal nodes as well.  In light of this, the node basin was dissected out in a standard fashion  as an axillary lymph node dissection.  The long thoracic nerve, thoracodorsal trunk and axillary vein were all preserved.  Clips were used for hemostasis.  Once the lymph node basin was removed it was evaluated with the Faxitron and the node containing the seed was present.  Neoprobe was used and at least there for the sentinel nodes that were present as well that were hot.  The cavity was irrigated.  Arista was placed in the cavity.  Through a separate stab incision a 19 round drain was placed and secured to the skin with 2-0 nylon.  After ensuring hemostasis the wound was closed with 3-0 Vicryl and 4 Monocryl.  The left-sided port was removed.  Incision was made to the previous scar.  Dissection was carried down the port identified and dissected free.  The port was removed in its entirety.  The track was closed with 3-0 Vicryl.  Wound closed with 3-0 Vicryl for Monocryl.  Dermabond applied.  Breast binder placed.  All counts were found to be correct.  The patient was awoke extubated taken to recovery in satisfactory condition.

## 2020-07-26 NOTE — Anesthesia Preprocedure Evaluation (Addendum)
Anesthesia Evaluation  Patient identified by MRN, date of birth, ID band Patient awake    Reviewed: Allergy & Precautions, NPO status , Patient's Chart, lab work & pertinent test results  Airway Mallampati: II  TM Distance: >3 FB     Dental   Pulmonary neg pulmonary ROS,    breath sounds clear to auscultation       Cardiovascular hypertension,  Rhythm:Regular Rate:Normal     Neuro/Psych negative neurological ROS     GI/Hepatic negative GI ROS, Neg liver ROS,   Endo/Other  diabetes  Renal/GU      Musculoskeletal   Abdominal   Peds  Hematology   Anesthesia Other Findings   Reproductive/Obstetrics                             Anesthesia Physical Anesthesia Plan  ASA: III  Anesthesia Plan: General   Post-op Pain Management:    Induction: Intravenous  PONV Risk Score and Plan: 3 and Ondansetron, Dexamethasone and Midazolam  Airway Management Planned: Oral ETT  Additional Equipment:   Intra-op Plan:   Post-operative Plan: Extubation in OR  Informed Consent: I have reviewed the patients History and Physical, chart, labs and discussed the procedure including the risks, benefits and alternatives for the proposed anesthesia with the patient or authorized representative who has indicated his/her understanding and acceptance.       Plan Discussed with: CRNA and Anesthesiologist  Anesthesia Plan Comments:        Anesthesia Quick Evaluation

## 2020-07-26 NOTE — Anesthesia Procedure Notes (Signed)
Procedure Name: Intubation Date/Time: 07/26/2020 2:41 PM Performed by: Glory Buff, CRNA Pre-anesthesia Checklist: Patient identified, Emergency Drugs available, Suction available and Patient being monitored Patient Re-evaluated:Patient Re-evaluated prior to induction Oxygen Delivery Method: Circle system utilized Preoxygenation: Pre-oxygenation with 100% oxygen Induction Type: IV induction Ventilation: Mask ventilation without difficulty Laryngoscope Size: Miller and 3 Grade View: Grade I Tube type: Oral Tube size: 7.0 mm Number of attempts: 1 Airway Equipment and Method: Stylet and Oral airway Placement Confirmation: ETT inserted through vocal cords under direct vision,  positive ETCO2 and breath sounds checked- equal and bilateral Secured at: 22 cm Tube secured with: Tape Dental Injury: Teeth and Oropharynx as per pre-operative assessment

## 2020-07-26 NOTE — Anesthesia Procedure Notes (Addendum)
Anesthesia Regional Block: Pectoralis block   Pre-Anesthetic Checklist: ,, timeout performed,, Correct Site,, Correct Procedure,, site marked,,,,,,  Laterality: Right  Prep: chloraprep       Needles:  Injection technique: Single-shot  Needle Type: Echogenic Stimulator Needle          Additional Needles:   Procedures: Doppler guided,,,, ultrasound used (permanent image in chart),,,,  Narrative:  Start time: 07/26/2020 1:25 PM End time: 07/26/2020 1:40 PM Injection made incrementally with aspirations every 5 mL.  Performed by: Personally  Anesthesiologist: Belinda Block, MD

## 2020-07-26 NOTE — Progress Notes (Signed)
Assisted Dr. Kenzlei Runions with right, ultrasound guided, pectoralis block. Side rails up, monitors on throughout procedure. See vital signs in flow sheet. Tolerated Procedure well. 

## 2020-07-26 NOTE — Anesthesia Postprocedure Evaluation (Signed)
Anesthesia Post Note  [name]  Procedure(s) Performed: REMOVAL PORT-A-CATH (N/A Breast) RIGHT BREAST LUMPECTOMY WITH RADIOACTIVE SEED X 2 AND SENTINEL LYMPH NODE BIOPSY (Right Breast)     Patient location during evaluation: PACU Anesthesia Type: General Level of consciousness: awake Pain management: pain level controlled Respiratory status: spontaneous breathing Cardiovascular status: stable Postop Assessment: no apparent nausea or vomiting Anesthetic complications: no   No complications documented.  Last Vitals:  Vitals:   07/26/20 1340 07/26/20 1643  BP: 123/74 119/76  Pulse: 90 86  Resp: 16 (!) 6  Temp:  37.2 C  SpO2: 100% 100%    Last Pain:  Vitals:   07/26/20 1643  TempSrc:   PainSc: Asleep                 Khristine Verno

## 2020-07-26 NOTE — Interval H&P Note (Signed)
History and Physical Interval Note:  07/26/2020 12:08 PM  [name] [name]  has presented today for surgery, with the diagnosis of RIGHT BREAST CANCER.  The various methods of treatment have been discussed with the patient and family. After consideration of risks, benefits and other options for treatment, the patient has consented to  Procedure(s): REMOVAL PORT-A-CATH (N/A) RIGHT BREAST LUMPECTOMY WITH RADIOACTIVE SEED X 2 AND SENTINEL LYMPH NODE BIOPSY (Right) as a surgical intervention.  The patient's history has been reviewed, patient examined, no change in status, stable for surgery.  I have reviewed the patient's chart and labs.  Questions were answered to the patient's satisfaction.     [place]

## 2020-07-26 NOTE — Progress Notes (Signed)
Nuc med injections completed. Patient tolerated well.   

## 2020-07-26 NOTE — Transfer of Care (Signed)
Immediate Anesthesia Transfer of Care Note  [name]  Procedure(s) Performed: REMOVAL PORT-A-CATH (N/A Breast) RIGHT BREAST LUMPECTOMY WITH RADIOACTIVE SEED X 2 AND SENTINEL LYMPH NODE BIOPSY (Right Breast)  Patient Location: PACU  Anesthesia Type:General  Level of Consciousness: drowsy, patient cooperative and responds to stimulation  Airway & Oxygen Therapy: Patient Spontanous Breathing and Patient connected to face mask oxygen  Post-op Assessment: Report given to RN and Post -op Vital signs reviewed and stable  Post vital signs: Reviewed and stable  Last Vitals:  Vitals Value Taken Time  BP    Temp    Pulse 86 07/26/20 1643  Resp 6 07/26/20 1643  SpO2 100 % 07/26/20 1643  Vitals shown include unvalidated device data.  Last Pain:  Vitals:   07/26/20 1147  TempSrc: Oral  PainSc: 9          Complications: No complications documented.

## 2020-07-26 NOTE — Discharge Instructions (Signed)
[place] [facility] Phone Number [phone]  BREAST BIOPSY/ PARTIAL MASTECTOMY: POST OP INSTRUCTIONS  Always review your discharge instruction sheet given to you by the facility where your surgery was performed.  IF YOU HAVE DISABILITY OR FAMILY LEAVE FORMS, YOU MUST BRING THEM TO THE OFFICE FOR PROCESSING.  DO NOT GIVE THEM TO YOUR DOCTOR.  1. A prescription for pain medication may be given to you upon discharge.  Take your pain medication as prescribed, if needed.  If narcotic pain medicine is not needed, then you may take acetaminophen (Tylenol) or ibuprofen (Advil) as needed. 2. Take your usually prescribed medications unless otherwise directed 3. If you need a refill on your pain medication, please contact your pharmacy.  They will contact our office to request authorization.  Prescriptions will not be filled after 5pm or on week-ends. 4. You should eat very light the first 24 hours after surgery, such as soup, crackers, pudding, etc.  Resume your normal diet the day after surgery. 5. Most patients will experience some swelling and bruising in the breast.  Ice packs and a good support bra will help.  Swelling and bruising can take several days to resolve.  6. It is common to experience some constipation if taking pain medication after surgery.  Increasing fluid intake and taking a stool softener will usually help or prevent this problem from occurring.  A mild laxative (Milk of Magnesia or Miralax) should be taken according to package directions if there are no bowel movements after 48 hours. 7. Unless discharge instructions indicate otherwise, you may remove your bandages 24-48 hours after surgery, and you may shower at that time.  You may have steri-strips (small skin tapes) in place directly over the incision.  These strips should be left on the skin for 7-10 days.  If your surgeon used skin glue on the incision, you may shower in 24 hours.  The glue will flake off over the  next 2-3 weeks.  Any sutures or staples will be removed at the office during your follow-up visit. 8. ACTIVITIES:  You may resume regular daily activities (gradually increasing) beginning the next day.  Wearing a good support bra or sports bra minimizes pain and swelling.  You may have sexual intercourse when it is comfortable. a. You may drive when you no longer are taking prescription pain medication, you can comfortably wear a seatbelt, and you can safely maneuver your car and apply brakes. b. RETURN TO WORK:  ______________________________________________________________________________________ 9. You should see your doctor in the office for a follow-up appointment approximately two weeks after your surgery.  Your doctor's nurse will typically make your follow-up appointment when she calls you with your pathology report.  Expect your pathology report 2-3 business days after your surgery.  You may call to check if you do not hear from [place] after three days. 10. OTHER INSTRUCTIONS: _______________________________________________________________________________________________ _____________________________________________________________________________________________________________________________________ _____________________________________________________________________________________________________________________________________ _____________________________________________________________________________________________________________________________________  WHEN TO CALL YOUR DOCTOR: 1. Fever over 101.0 2. Nausea and/or vomiting. 3. Extreme swelling or bruising. 4. Continued bleeding from incision. 5. Increased pain, redness, or drainage from the incision.  The clinic staff is available to answer your questions during regular business hours.  Please don't hesitate to call and ask to speak to one of the nurses for clinical concerns.  If you have a medical emergency, go to the nearest  emergency room or call 911.  A surgeon from [facility] is always on call at the hospital.  For further questions, please visit [email or web address]  .  Surgical [facility] Surgical drains are used to remove extra fluid that normally builds up in a surgical wound after surgery. A surgical drain helps to heal a surgical wound. Different kinds of surgical drains include:  Active drains. These drains use suction to pull drainage away from the surgical wound. Drainage flows through a tube to a container outside of the body. With these drains, you need to keep the bulb or the drainage container flat (compressed) at all times, except while you empty it. Flattening the bulb or container creates suction.  Passive drains. These drains allow fluid to drain naturally, by gravity. Drainage flows through a tube to a bandage (dressing) or a container outside of the body. Passive drains do not need to be emptied. A drain is placed during surgery. Right after surgery, drainage is usually bright red and a little thicker than water. The drainage may gradually turn yellow or pink and become thinner. It is likely that your health care provider will remove the drain when the drainage stops or when the amount decreases to 1-2 Tbsp (15-30 mL) during a 24-hour period. Supplies needed:  Tape.  Germ-free cleaning solution (sterile saline).  Cotton swabs.  Split gauze drain sponge: 4 x 4 inches (10 x 10 cm).  Gauze square: 4 x 4 inches (10 x 10 cm). How to care for your surgical drain Care for your drain as told by your health care provider. This is important to help prevent infection. If your drain is placed at your back, or any other hard-to-reach area, ask another person to assist you in performing the following tasks: General care  Keep the skin around the drain dry and covered with a dressing at all times.  Check your drain area every day for signs of infection. Check for: ? Redness,  swelling, or pain. ? Pus or a bad smell. ? Cloudy drainage. ? Tenderness or pressure at the drain exit site. Changing the dressing Follow instructions from your health care provider about how to change your dressing. Change your dressing at least once a day. Change it more often if needed to keep the dressing dry. Make sure you: 1. Gather your supplies. 2. Wash your hands with soap and water before you change your dressing. If soap and water are not available, use hand sanitizer. 3. Remove the old dressing. Avoid using scissors to do that. 4. Wash your hands with soap and water again after removing the old dressing. 5. Use sterile saline to clean your skin around the drain. You may need to use a cotton swab to clean the skin. 6. Place the tube through the slit in a drain sponge. Place the drain sponge so that it covers your wound. 7. Place the gauze square or another drain sponge on top of the drain sponge that is on the wound. Make sure the tube is between those layers. 8. Tape the dressing to your skin. 9. Tape the drainage tube to your skin 1-2 inches (2.5-5 cm) below the place where the tube enters your body. Taping keeps the tube from pulling on any stitches (sutures) that you have. 10. Wash your hands with soap and water. 11. Write down the color of your drainage and how often you change your dressing. How to empty your active drain 1. Make sure that you have a measuring cup that you can empty your drainage into. 2. Wash your hands with soap and water. If soap and water are not available, use hand sanitizer. 3. Loosen  any pins or clips that hold the tube in place. 4. If your health care provider tells you to strip the tube to prevent clots and tube blockages: ? Hold the tube at the skin with one hand. Use your other hand to pinch the tubing with your thumb and first finger. ? Gently move your fingers down the tube while squeezing very lightly. This clears any drainage, clots, or tissue  from the tube. ? You may need to do this several times each day to keep the tube clear. Do not pull on the tube.  5. Open the bulb cap or the drain plug. Do not touch the inside of the cap or the bottom of the plug. 6. Turn the device upside down and gently squeeze. 7. Empty all of the drainage into the measuring cup. 8. Compress the bulb or the container and replace the cap or the plug. To compress the bulb or the container, squeeze it firmly in the middle while you close the cap or plug the container. 9. Write down the amount of drainage that you have in each 24-hour period. If you have less than 2 Tbsp (30 mL) of drainage during 24 hours, contact your health care provider. 10. Flush the drainage down the toilet. 11. Wash your hands with soap and water.    Contact a health care provider if:  You have redness, swelling, or pain around your drain area.  You have pus or a bad smell coming from your drain area.  You have a fever or chills.  The skin around your drain is warm to the touch.  The amount of drainage that you have is increasing instead of decreasing.  You have drainage that is cloudy.  There is a sudden stop or a sudden decrease in the amount of drainage that you have.  Your drain tube falls out.  Your active drain does not stay compressed after you empty it. Summary  Surgical drains are used to remove extra fluid that normally builds up in a surgical wound after surgery.  Different kinds of surgical drains include active drains and passive drains. Active drains use suction to pull drainage away from the surgical wound, and passive drains allow fluid to drain naturally.  It is important to care for your drain to prevent infection. If your drain is placed at your back, or any other hard-to-reach area, ask another person to assist you.  Contact your health care provider if you have redness, swelling, or pain around your drain area. This information is not intended to  replace advice given to you by your health care provider. Make sure you discuss any questions you have with your health care provider. Document Revised: 06/02/2018 Document Reviewed: 06/02/2018 Elsevier Patient Education  2021 [place] Instructions  Activity: Get plenty of rest for the remainder of the day. A responsible individual must stay with you for 24 hours following the procedure.  For the next 24 hours, DO NOT: -Drive a car -Paediatric nurse -Drink alcoholic beverages -Take any medication unless instructed by your physician -Make any legal decisions or sign important papers.  Meals: Start with liquid foods such as gelatin or soup. Progress to regular foods as tolerated. Avoid greasy, spicy, heavy foods. If nausea and/or vomiting occur, drink only clear liquids until the nausea and/or vomiting subsides. Call your physician if vomiting continues.  Special Instructions/Symptoms: Your throat may feel dry or sore from the anesthesia or the breathing tube placed in  your throat during surgery. If this causes discomfort, gargle with warm salt water. The discomfort should disappear within 24 hours.  If you had a scopolamine patch placed behind your ear for the management of post- operative nausea and/or vomiting:  1. The medication in the patch is effective for 72 hours, after which it should be removed.  Wrap patch in a tissue and discard in the trash. Wash hands thoroughly with soap and water. 2. You may remove the patch earlier than 72 hours if you experience unpleasant side effects which may include dry mouth, dizziness or visual disturbances. 3. Avoid touching the patch. Wash your hands with soap and water after contact with the patch.     About my Jackson-Pratt Bulb Drain  What is a Jackson-Pratt bulb? A Jackson-Pratt is a soft, round device used to collect drainage. It is connected to a long, thin drainage catheter, which is held in place by  one or two small stiches near your surgical incision site. When the bulb is squeezed, it forms a vacuum, forcing the drainage to empty into the bulb.  Emptying the Jackson-Pratt bulb- To empty the bulb: 1. Release the plug on the top of the bulb. 2. Pour the bulb's contents into a measuring container which your nurse will provide. 3. Record the time emptied and amount of drainage. Empty the drain(s) as often as your     doctor or nurse recommends.  Date                          Time                                              Amount (Drain 1)                   _____________________________________________________________________  _____________________________________________________________________  _____________________________________________________________________  _____________________________________________________________________  _____________________________________________________________________  _____________________________________________________________________  _____________________________________________________________________  _____________________________________________________________________  Squeezing the Jackson-Pratt Bulb- To squeeze the bulb: 1. Make sure the plug at the top of the bulb is open. 2. Squeeze the bulb tightly in your fist. You will hear air squeezing from the bulb. 3. Replace the plug while the bulb is squeezed. 4. Use a safety pin to attach the bulb to your clothing. This will keep the catheter from     pulling at the bulb insertion site.  When to call your doctor- Call your doctor if:  Drain site becomes red, swollen or hot.  You have a fever greater than 101 degrees F.  There is oozing at the drain site.  Drain falls out (apply a guaze bandage over the drain hole and secure it with tape).  Drainage increases daily not related to activity patterns. (You will usually have more drainage when you are active than when you are  resting.)  Drainage has a bad odor.

## 2020-07-27 ENCOUNTER — Encounter (HOSPITAL_BASED_OUTPATIENT_CLINIC_OR_DEPARTMENT_OTHER): Payer: Self-pay | Admitting: Surgery

## 2020-07-31 ENCOUNTER — Other Ambulatory Visit: Payer: Self-pay | Admitting: *Deleted

## 2020-07-31 DIAGNOSIS — C50411 Malignant neoplasm of upper-outer quadrant of right female breast: Secondary | ICD-10-CM

## 2020-07-31 DIAGNOSIS — Z170 Estrogen receptor positive status [ER+]: Secondary | ICD-10-CM

## 2020-08-01 ENCOUNTER — Encounter: Payer: Self-pay | Admitting: *Deleted

## 2020-08-03 LAB — SURGICAL PATHOLOGY

## 2020-08-16 ENCOUNTER — Ambulatory Visit: Payer: No Typology Code available for payment source | Attending: Surgery | Admitting: Physical Therapy

## 2020-08-16 ENCOUNTER — Other Ambulatory Visit: Payer: Self-pay

## 2020-08-16 DIAGNOSIS — M6281 Muscle weakness (generalized): Secondary | ICD-10-CM | POA: Insufficient documentation

## 2020-08-16 DIAGNOSIS — Z483 Aftercare following surgery for neoplasm: Secondary | ICD-10-CM | POA: Insufficient documentation

## 2020-08-16 DIAGNOSIS — M25611 Stiffness of right shoulder, not elsewhere classified: Secondary | ICD-10-CM | POA: Insufficient documentation

## 2020-08-16 DIAGNOSIS — R293 Abnormal posture: Secondary | ICD-10-CM | POA: Insufficient documentation

## 2020-08-16 NOTE — Patient Instructions (Signed)
SHOULDER: Flexion - Supine (Cane)        Cancer Rehab [phone]    Hold cane in both hands. Raise arms up overhead. Do not allow back to arch. Hold _5__ seconds. Do __5-10__ times; __1-2__ times a day.   SELF ASSISTED WITH OBJECT: Shoulder Abduction / Adduction - Supine    Hold cane with both hands. Move both arms from side to side, keep elbows straight.  Hold when stretch felt for __5__ seconds. Repeat __5-10__ times; __1-2__ times a day. Once this becomes easier progress to third picture bringing affected arm towards ear by staying out to side. Same hold for _5_seconds. Repeat  _5-10_ times, _1-2_ times/day.  Shoulder Blade Stretch    Clasp fingers behind head with elbows touching in front of face. Pull elbows back while pressing shoulder blades together. Relax and hold as tolerated, can place pillow under elbow here for comfort as needed and to allow for prolonged stretch.  Repeat __5__ times. Do __1-2__ sessions per day.      Copyright  VHI. All rights reserved.       

## 2020-08-16 NOTE — Therapy (Signed)
[place], [place], [address] Phone: [phone]   Fax:  [phone]  Physical Therapy Re- Evaluation  Patient Details  Name: [name] [name] MRN: [id number] Date of Birth: 1955-05-19 Referring Provider (PT): Cornett   Encounter Date: 08/16/2020   PT End of Session - 08/16/20 1739    Visit Number 2    Number of Visits 10    Date for PT Re-Evaluation 09/17/20    PT Start Time 1400    PT Stop Time 1445    PT Time Calculation (min) 45 min    Activity Tolerance Patient tolerated treatment well    Behavior During Therapy [facility] for tasks assessed/performed           Past Medical History:  Diagnosis Date  . Arthritis   . Cancer ([place])    breast  . Diabetes mellitus without complication ([place])   . Hypertension     Past Surgical History:  Procedure Laterality Date  . BREAST LUMPECTOMY WITH RADIOACTIVE SEED AND SENTINEL LYMPH NODE BIOPSY Right 07/26/2020   Procedure: RIGHT BREAST LUMPECTOMY WITH RADIOACTIVE SEED X 2 AND SENTINEL LYMPH NODE BIOPSY;  Surgeon: Erroll Luna, MD;  Location: [facility];  Service: General;  Laterality: Right;  . IR IMAGING GUIDED PORT INSERTION  02/23/2020  . PORT-A-CATH REMOVAL N/A 07/26/2020   Procedure: REMOVAL PORT-A-CATH;  Surgeon: Erroll Luna, MD;  Location: [facility];  Service: General;  Laterality: N/A;    There were no vitals filed for this visit.    Subjective Assessment - 08/16/20 1404    Subjective Pt says she is having some stiffness in the armpit and some numbness. Pt also interested in getting a sleeve as she wil be flying in about 2 or 3 months.  She is having severe pain, swelling stiffness in both knees that is is limiting her walking    Patient is accompained by: Family member   daughter   Pertinent History R breast cancer with mets to lymphnodes, Pt had chemotherapy, lumpectomy with full axillary node dissection on March 7  and plans to have radiation. History includes arthritis ( severe in knees and kneecaps per daughter)  diabetes, and hypertension    Patient Stated Goals to get arm moving and to fit for a sleeve    Currently in Pain? Yes    Pain Score 5     Pain Location Axilla    Pain Orientation Right    Pain Descriptors / Indicators --   stings and feels pulling down into upper arm   Pain Type Surgical pain    Pain Radiating Towards down back of upper arm    Pain Onset 1 to 4 weeks ago    Pain Frequency Intermittent    Aggravating Factors  stretching arm    Pain Relieving Factors repositioning arm    Effect of Pain on Daily Activities unable to reach overhead              [facility] PT Assessment - 08/16/20 0001      Assessment   Medical Diagnosis R breast cancer    Referring Provider (PT) Cornett    Hand Dominance Left    Prior Therapy none      Precautions   Precautions Other (comment)    Precaution Comments active cancer      Restrictions   Weight Bearing Restrictions No      Home Environment   Living Environment Private residence    Living  Arrangements Children   daughter   Available Help at Discharge Family    Type of Home House      Prior Function   Level of [place] Retired    Leisure --      Cognition   Overall Cognitive Status Within Functional Limits for tasks assessed      Observation/Other Assessments   Observations Pt with healing incisions in right breast and axilla. One incision is not approximated, but has visible white tissue as incision is healing from the bottom up. There is no drainage or odor. Pt wears a dressing overtop of it to keep it from being rubbed by clothing. . Pt has difficutly with sit to stand and getting started walking.    Other Surveys  Quick Dash    Quick DASH  45.45      Posture/Postural Control   Posture/Postural Control Postural limitations    Postural Limitations Rounded Shoulders;Forward head      ROM /  Strength   AROM / PROM / Strength AROM      AROM   AROM Assessment Site Shoulder    Right/Left Shoulder Right    Right Shoulder Extension --    Right Shoulder Flexion 155 Degrees    Right Shoulder ABduction 135 Degrees    Right Shoulder Internal Rotation --    Right Shoulder External Rotation 90 Degrees    Left Shoulder Extension --    Left Shoulder Flexion --    Left Shoulder ABduction --    Left Shoulder Internal Rotation --    Left Shoulder External Rotation --             LYMPHEDEMA/ONCOLOGY QUESTIONNAIRE - 08/16/20 0001      Right Upper Extremity Lymphedema   15 cm Proximal to Olecranon Process 32 cm    Olecranon Process 27 cm    15 cm Proximal to Ulnar Styloid Process 25 cm    Just Proximal to Ulnar Styloid Process 17.5 cm    Across Hand at [employer] 21 cm    At [place] of 2nd Digit 7 cm                 Quick Dash - 08/16/20 0001    Open a tight or new jar Moderate difficulty    Do heavy household chores (wash walls, wash floors) Moderate difficulty    Carry a shopping bag or briefcase Moderate difficulty    Wash your back Unable    Use a knife to cut food No difficulty    Recreational activities in which you take some force or impact through your arm, shoulder, or hand (golf, hammering, tennis) Moderate difficulty    During the past week, to what extent has your arm, shoulder or hand problem interfered with your normal social activities with family, friends, neighbors, or groups? Slightly    During the past week, to what extent has your arm, shoulder or hand problem limited your work or other regular daily activities Slightly    Arm, shoulder, or hand pain. Moderate    Tingling (pins and needles) in your arm, shoulder, or hand Moderate    Difficulty Sleeping Moderate difficulty    DASH Score 45.45 %            Objective measurements completed on examination: See above findings.       [facility] Adult PT Treatment/Exercise - 08/16/20 0001       Self-Care   Self-Care Other Self-Care Comments  Other Self-Care Comments  pt scheduled to be fit for prophylactic compression sleeve on Thursday April 14 at 3:00 , [employer] form signed      Exercises   Exercises Shoulder      Shoulder Exercises: Supine   Flexion AAROM;Both;5 reps    Flexion Limitations with dowel    ABduction AAROM;Right;Both    ABduction Limitations with dowel within limits    Other Supine Exercises stargazer stretch on right with pillow under elbow to support arm   pt and daughter instructed in radiation position                 PT Education - 08/16/20 1739    Education Details supine dowel exercises    Person(s) Educated Patient    Methods Explanation;Demonstration;Handout    Comprehension Verbalized understanding               PT Long Term Goals - 08/16/20 1747      PT LONG TERM GOAL #1   Title Pt will return to baseline measurements for shoulder ROM and RUE circumferences to return to PLOF.    Time 4    Period Weeks    Status On-going      PT LONG TERM GOAL #2   Title Pt will be independent in a HEP for shoulder ROM and strength    Time 4    Period Weeks    Status New      PT LONG TERM GOAL #3   Title Pt will be knowledgeable of lymphedema risk reduction and how to sue prophylactic sleeve for flying    Time 4    Period Weeks    Status New      PT LONG TERM GOAL #4   Title Pt will decrease Quick DASH score to < 30 indicating an improvment in UE function    Baseline 45.45 on 08/16/2020    Time 4    Period Weeks    Status New                  Plan - 08/16/20 1740    Clinical Impression Statement Pt comes back to PT for post op recheck after surgery for 2 lumpectomies and axillary node dissection. Circumferences of right arm are the same as before surgery..  She has one incsion that is delayed in healing, but is not draining or have have odor.  She has fairly good ROM and was instructed in dowel exercises to do at home to  prepare for radiation and will benefit from ongoing PT for shoudler ROM and strength. She was given an appointment for measurement of prophylactic sleeve and glove that will be paid for by [employer]. Pt has significant limitation in mobility from arthritis in both knees and would benefit from PT for that when she is finsihed with cancer treatment.    Personal Factors and Comorbidities Comorbidity 2;Comorbidity 3+    Comorbidities diabetes, bilateral knee arthritis, chemotherapy.    Examination-Activity Limitations Reach Overhead;Carry;Bathing;Dressing    Stability/Clinical Decision Making Evolving/Moderate complexity    Clinical Decision Making Moderate    Rehab Potential Excellent    PT Frequency 2x / week    PT Duration 4 weeks    PT Treatment/Interventions ADLs/Self Care Home Management;Patient/family education;Therapeutic exercise;DME Instruction;Therapeutic activities;Passive range of motion;Manual techniques;Manual lymph drainage;Scar mobilization    PT Next Visit Plan shoulder ROM stretching and strengthening with manual techniques and exercise. Make sure pt was measured for sleeve    Consulted and Agree with  Plan of Care Patient           Patient will benefit from skilled therapeutic intervention in order to improve the following deficits and impairments:  Postural dysfunction,Pain,Decreased knowledge of precautions,Decreased range of motion,Decreased scar mobility,Decreased strength,Increased fascial restricitons,Impaired UE functional use  Visit Diagnosis: Abnormal posture - Plan: PT plan of care cert/re-cert  Aftercare following surgery for neoplasm - Plan: PT plan of care cert/re-cert  Stiffness of right shoulder, not elsewhere classified - Plan: PT plan of care cert/re-cert  Muscle weakness (generalized) - Plan: PT plan of care cert/re-cert     Problem List Patient Active Problem List   Diagnosis Date Noted  . Aortic atherosclerosis ([place]) 04/24/2020  . 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 Port-A-Cath in place 03/26/2020  . Malignant neoplasm of upper-outer quadrant of right breast in female, estrogen receptor positive ([place]) 02/13/2020  Donato Heinz. Owens Shark PT  Norwood Levo 08/16/2020, 5:58 PM  [place], [place], [address] Phone: [phone]   Fax:  [phone]  Name: [name] [name] MRN: [id number] Date of Birth: 12-07-1955

## 2020-08-17 ENCOUNTER — Other Ambulatory Visit (HOSPITAL_COMMUNITY): Payer: Self-pay

## 2020-08-21 NOTE — Progress Notes (Signed)
[place]  [phone] [phone]     ID: [name] [name] DOB: October 12, 1955  MR#: [id number]  FIE#:[id number]  Patient Care Team: Kristie Cowman, MD as PCP - General (Family Medicine) Mauro Kaufmann, RN as Oncology Nurse Navigator Rockwell Germany, RN as Oncology Nurse Navigator Zan Orlick, Virgie Dad, MD as Consulting Physician (Oncology) Erroll Luna, MD as Consulting Physician (General Surgery) Eppie Gibson, MD as Attending Physician (Radiation Oncology) Chauncey Cruel, MD OTHER MD:  I connected with [name] [name] on 08/22/20 at  3:30 PM EDT by video enabled telemedicine visit however while we could see each other we could not hear each other and I was not able to call her directly on her phone  Other persons participating in the visit and their role in the encounter: Patient's daughter  Patient's location: Home Provider's location: [place]  Total time spent: x min   CHIEF COMPLAINT: Estrogen receptor positive breast cancer  CURRENT TREATMENT:    INTERVAL HISTORY: [name] [name] was contacted today for follow up of her estrogen receptor positive breast cancer.   Since her last visit, she underwent right breast lumpectomy x2 on 07/26/2020 under Dr. Brantley Stage. Pathology from the procedure [id number]) showed: 1. Right Breast, Subareolar  - no residual invasive carcinoma 2. Right Breast, Lateral  - residual invasive ductal carcinoma, 1.2 cm, grade 2  - margins not involved 3. Right Lymph Nodes (6)  - two with metastatic carcinoma (2/6)  She also had her port removed at that time.   REVIEW OF SYSTEMS: [name] [name]    COVID 31 VACCINATION STATUS: s/p  [employer]  X2, most recently June 2021, no booster as of 05/01/2020   HISTORY OF CURRENT ILLNESS: From the original intake note:  [name] [name] herself palpated a mass in the right axilla "a long time ago", more recently with complaints of associated pain. She brought it to medical  attention and underwent bilateral diagnostic mammography with tomography and bilateral breast ultrasonography at [facility] on 01/31/2020 showing: breast density category B; three right breast masses-- 1.9 cm at 12 o'clock, 2.3 cm at 11 o'clock, 0.4 cm at 1 o'clock; two adjacent enlarged thickened lymph nodes measuring up to 2.1 cm; no evidence of left breast malignancy.  Accordingly on 02/03/2020 she proceeded to biopsy of the right breast area at 12 o'clock and right axilla. The pathology from this procedure ([id number]) showed: invasive ductal carcinoma, grade 3. Prognostic indicators significant for: estrogen receptor, 100% positive with strong staining intensity and progesterone receptor, 0% negative. Proliferation marker Ki67 at 60%. HER2 equivocal by immunohistochemistry (2+), but negative by fluorescent in situ hybridization with a signals ratio 0.81 and number per cell 2.7.  The biopsied lymph node was found to show metastatic carcinoma. The morphology was considered different from the biopsied mass, and a prognostic panel was performed. Estrogen receptor, 100% positive with strong staining intensity and progesterone receptor, 0% negative. Proliferation marker Ki67 at 70%. HER2 equivcoal by immunohistochemistry (2+), but negative) by fluorescent in situ hybridization with a signals ratio 1.16 and number per cell 3.9.  She underwent additional right breast biopsies on 02/06/2020. Pathology [id number]) from the mass at 11 o'clock showed: invasive ductal carcinoma, grade 2. Prognostic indicators significant for: estrogen receptor, 100% positive with strong staining intensity and progesterone receptor, 0% negative. Proliferation marker Ki67 at 15%. HER2 equivocal by immunohistochemistry (2+), but negative by fluorescent in situ hybridization with a signals ratio 1.29 and number per cell 2.7.  The biopsied mass at 1  o'clock showed only fibrocystic change.  The patient's subsequent history is as  detailed below.   PAST MEDICAL HISTORY: Past Medical History:  Diagnosis Date  . Arthritis   . Cancer ([place])    breast  . Diabetes mellitus without complication ([place])   . Hypertension     PAST SURGICAL HISTORY: Past Surgical History:  Procedure Laterality Date  . BREAST LUMPECTOMY WITH RADIOACTIVE SEED AND SENTINEL LYMPH NODE BIOPSY Right 07/26/2020   Procedure: RIGHT BREAST LUMPECTOMY WITH RADIOACTIVE SEED X 2 AND SENTINEL LYMPH NODE BIOPSY;  Surgeon: Erroll Luna, MD;  Location: [facility];  Service: General;  Laterality: Right;  . IR IMAGING GUIDED PORT INSERTION  02/23/2020  . PORT-A-CATH REMOVAL N/A 07/26/2020   Procedure: REMOVAL PORT-A-CATH;  Surgeon: Erroll Luna, MD;  Location: [facility];  Service: General;  Laterality: N/A;    FAMILY HISTORY: Family History  Problem Relation Age of Onset  . Asthma Mother   . Arthritis Sister   . Asthma Brother   . Arthritis Brother   [name] died in his 4s from causes unknown to the patient.  [name] died from heart disease at age 4.  The patient had 4 brothers, 1 sister, with no history of cancer in the family to her knowledge   GYNECOLOGIC HISTORY:  No LMP recorded. Patient is postmenopausal. Menarche: 65 years old Age at first live birth: 64 years old [place] P 1 LMP 37s Contraceptive HRT no  Hysterectomy? no BSO? no   SOCIAL HISTORY: (updated 02/2020)  [name] [name] is originally from [place].  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  She describes herself as single.  At home she lives with her daughter [name] who works for [employer], her daughters husband [name] who is a English as a second language teacher, and their children who are 64 months old and 29 years old.  The patient is in [place]: Not in place.  The patient tells me she would intend to name her daughter [name] as her healthcare power of attorney   HEALTH MAINTENANCE: Social History    Tobacco Use  . Smoking status: Never Smoker  . Smokeless tobacco: Never Used  Vaping Use  . Vaping Use: Never used  Substance Use Topics  . Alcohol use: Yes    Alcohol/week: 1.0 standard drink    Types: 1 Glasses of wine per week    Comment: occasionally  . Drug use: Never     Colonoscopy: never done  PAP: 2017 (performed in [place])  Bone density:    Allergies  Allergen Reactions  . Sulfa Antibiotics Itching    Current Outpatient Medications  Medication Sig Dispense Refill  . acetaminophen (TYLENOL) 500 MG tablet Take 500 mg by mouth 3 (three) times daily as needed.    Marland Kitchen amLODipine (NORVASC) 5 MG tablet Take 5 mg by mouth daily.    Marland Kitchen atorvastatin (LIPITOR) 40 MG tablet Take 40 mg by mouth daily.    . carvedilol (COREG) 6.25 MG tablet Take 6.25 mg by mouth 2 (two) times daily with a meal.    . celecoxib (CELEBREX) 200 MG capsule Take 200 mg by mouth daily.     Marland Kitchen ibuprofen (ADVIL) 800 MG tablet Take 1 tablet (800 mg total) by mouth every 8 (eight) hours as needed. 30 tablet 0  . lansoprazole (PREVACID) 30 MG capsule Take 30 mg by mouth every morning.    Marland Kitchen losartan (COZAAR) 100 MG  tablet Take 100 mg by mouth daily.    . metFORMIN (GLUCOPHAGE) 1000 MG tablet Take 1,000 mg by mouth daily with breakfast.     . oxyCODONE (OXY IR/ROXICODONE) 5 MG immediate release tablet Take 1 tablet (5 mg total) by mouth every 6 (six) hours as needed for severe pain. 15 tablet 0  . prochlorperazine (COMPAZINE) 10 MG tablet Take 1 tablet (10 mg total) by mouth every 6 (six) hours as needed (Nausea or vomiting). 30 tablet 1  . rivaroxaban (XARELTO) 20 MG TABS tablet Take 1 tablet (20 mg total) by mouth daily with supper. 35 tablet 0  . rivaroxaban (XARELTO) 20 MG TABS tablet TAKE 1 TABLET BY MOUTH ONCE A DAY WITH SUPPER 90 tablet 2   No current facility-administered medications for this visit.    OBJECTIVE: African-American woman who appears older than stated age  There were no vitals  filed for this visit.   There is no height or weight on file to calculate BMI.   Wt Readings from Last 3 Encounters:  07/26/20 207 lb 3.7 oz (94 kg)  07/18/20 212 lb 12.8 oz (96.5 kg)  06/25/20 213 lb 12.8 oz (97 kg)      ECOG FS:1 - Symptomatic but completely ambulatory  Telemedicine visit 08/22/2020     LAB RESULTS:  CMP     Component Value Date/Time   NA 138 07/20/2020 1530   K 4.4 07/20/2020 1530   CL 105 07/20/2020 1530   CO2 27 07/20/2020 1530   GLUCOSE 102 (H) 07/20/2020 1530   BUN 5 (L) 07/20/2020 1530   CREATININE 0.53 07/20/2020 1530   CREATININE 0.76 04/02/2020 0900   CALCIUM 9.5 07/20/2020 1530   PROT 6.2 (L) 07/20/2020 1530   ALBUMIN 4.0 07/20/2020 1530   AST 28 07/20/2020 1530   AST 10 (L) 04/02/2020 0900   ALT 21 07/20/2020 1530   ALT 14 04/02/2020 0900   ALKPHOS 59 07/20/2020 1530   BILITOT 0.4 07/20/2020 1530   BILITOT 0.6 04/02/2020 0900   GFRNONAA >60 07/20/2020 1530   GFRNONAA >60 04/02/2020 0900    No results found for: TOTALPROTELP, ALBUMINELP, A1GS, A2GS, BETS, BETA2SER, GAMS, MSPIKE, SPEI  Lab Results  Component Value Date   WBC 3.2 (L) 07/20/2020   NEUTROABS 1.6 (L) 07/20/2020   HGB 10.9 (L) 07/20/2020   HCT 33.5 (L) 07/20/2020   MCV 99.7 07/20/2020   PLT 357 07/20/2020    No results found for: LABCA2  No components found for: IWPYKD983  No results for input(s): INR in the last 168 hours.  No results found for: LABCA2  No results found for: JAS505  No results found for: LZJ673  No results found for: ALP379  No results found for: CA2729  No components found for: HGQUANT  No results found for: CEA1 / No results found for: CEA1   No results found for: AFPTUMOR  No results found for: CHROMOGRNA  No results found for: KPAFRELGTCHN, LAMBDASER, KAPLAMBRATIO (kappa/lambda light chains)  No results found for: HGBA, HGBA2QUANT, HGBFQUANT, HGBSQUAN (Hemoglobinopathy evaluation)   No results found for: LDH  No results  found for: IRON, TIBC, IRONPCTSAT (Iron and TIBC)  No results found for: FERRITIN  Urinalysis No results found for: COLORURINE, APPEARANCEUR, LABSPEC, PHURINE, GLUCOSEU, HGBUR, BILIRUBINUR, KETONESUR, PROTEINUR, UROBILINOGEN, NITRITE, LEUKOCYTESUR   STUDIES: NM Sentinel Node Inj-No Rpt (Breast)  Result Date: 07/26/2020 Sulfur colloid was injected by the nuclear medicine technologist for melanoma sentinel node.   MM Breast Surgical Specimen  Result  Date: 07/26/2020 CLINICAL DATA:  Patient status post right lumpectomy and node excision. EXAM: SPECIMEN RADIOGRAPH OF THE RIGHT BREAST COMPARISON:  Previous exam(s). FINDINGS: Status post excision of the right breast. The radioactive seed and biopsy marker clips are present and completely intact. IMPRESSION: Specimen radiograph of the right breast. Electronically Signed   By: Lovey Newcomer M.D.   On: 07/26/2020 16:10   MM Breast Surgical Specimen  Result Date: 07/26/2020 CLINICAL DATA:  Status post right breast lumpectomy. EXAM: SPECIMEN RADIOGRAPH OF THE RIGHT BREAST COMPARISON:  Previous exam(s). FINDINGS: Status post excision of the right breast. The radioactive seed and biopsy marker clip are present and completely intact. IMPRESSION: Specimen radiograph of the right breast. Electronically Signed   By: Lovey Newcomer M.D.   On: 07/26/2020 16:09   MM Breast Surgical Specimen  Result Date: 07/26/2020 CLINICAL DATA:  Patient status post right breast lumpectomy. EXAM: SPECIMEN RADIOGRAPH OF THE RIGHT BREAST COMPARISON:  Previous exam(s). FINDINGS: Status post excision of the right breast. The radioactive seed and biopsy marker clip are present and completely intact. IMPRESSION: Specimen radiograph of the right breast. Electronically Signed   By: Lovey Newcomer M.D.   On: 07/26/2020 16:09   [place] RT RADIOACTIVE SEED LOC  Result Date: 07/25/2020 CLINICAL DATA:  Right breast invasive ductal carcinoma diagnosed in the 12 o'clock and 11 o'clock positions of the  breast as well as a metastatic right axillary lymph node in September 2021. She has subsequently undergone neoadjuvant chemotherapy. Pre lumpectomy localizations. EXAM: ULTRASOUND GUIDED RADIOACTIVE SEED LOCALIZATION OF THE RIGHT BREAST X 2 ULTRASOUND-GUIDED RADIOACTIVE SEED LOCALIZATION THE RIGHT AXILLA COMPARISON:  Previous exam(s). FINDINGS: Patient presents for radioactive seed localizations prior to right lumpectomy. I met with the patient and we discussed the procedure of seed localization including benefits and alternatives. We discussed the high likelihood of successful procedures. We discussed the risks of the procedures including infection, bleeding, tissue injury and further surgery. We discussed the low dose of radioactivity involved in the procedures. Informed, written consent was given. The usual time-out protocol was performed immediately prior to the procedures. SITE 1: MASS IN THE 12 O'CLOCK POSITION OF THE RIGHT BREAST CONTAINING A BIOPSY MARKER CLIP Using ultrasound guidance, sterile technique, 1% lidocaine and an I-125 radioactive seed, the mass containing a biopsy marker clip in the 12 o'clock position of the right breast, 1 cm from the nipple, was localized using a medial approach. The follow-up mammogram images confirm the seed in the expected location and were marked for Dr. Brantley Stage. Follow-up survey of the patient confirms presence of the radioactive seed. Order number of I-125 seed:  [id number]. Total activity:  0.247 mCi reference Date: 07/12/2020 SITE 2: MASS IN THE 11 O'CLOCK POSITION OF THE RIGHT BREAST CONTAINING A BIOPSY MARKER CLIP Using ultrasound guidance, sterile technique, 1% lidocaine and an I-125 radioactive seed, the mass containing a biopsy marker clip in the 11 o'clock position of the right breast, 3 cm from the nipple, was localized using a medial approach. The follow-up mammogram images confirm the seed in the expected location and were marked for Dr. Brantley Stage. Follow-up  survey of the patient confirms presence of the radioactive seed. Order number of I-125 seed:  [id number]. Total activity:  0.247 mCi reference Date: 07/12/2020 SITE 3: RIGHT AXILLARY LYMPH NODE CONTAINING A BIOPSY MARKER CLIP Using ultrasound guidance, sterile technique, 1% lidocaine and an I-125 radioactive seed, the abnormal right axillary lymph node containing a biopsy marker clip was localized using an inferolateral approach. The  follow-up mammogram images confirm the seed in the expected location and were marked for Dr. Brantley Stage. Follow-up survey of the patient confirms presence of the radioactive seed. Order number of I-125 seed:  [id number]. Total activity:  0.247 mCi reference Date: 07/12/2020 The patient tolerated the procedures well and was released from the [facility]. She was given instructions regarding seed removal. IMPRESSION: Radioactive seed localization right breast x 2 and right axilla. No apparent complications. Electronically Signed   By: Claudie Revering M.D.   On: 07/25/2020 12:34   [place] RT RADIOACTIVE SEED EA ADD LESION  Result Date: 07/25/2020 CLINICAL DATA:  Right breast invasive ductal carcinoma diagnosed in the 12 o'clock and 11 o'clock positions of the breast as well as a metastatic right axillary lymph node in September 2021. She has subsequently undergone neoadjuvant chemotherapy. Pre lumpectomy localizations. EXAM: ULTRASOUND GUIDED RADIOACTIVE SEED LOCALIZATION OF THE RIGHT BREAST X 2 ULTRASOUND-GUIDED RADIOACTIVE SEED LOCALIZATION THE RIGHT AXILLA COMPARISON:  Previous exam(s). FINDINGS: Patient presents for radioactive seed localizations prior to right lumpectomy. I met with the patient and we discussed the procedure of seed localization including benefits and alternatives. We discussed the high likelihood of successful procedures. We discussed the risks of the procedures including infection, bleeding, tissue injury and further surgery. We discussed the low dose of radioactivity  involved in the procedures. Informed, written consent was given. The usual time-out protocol was performed immediately prior to the procedures. SITE 1: MASS IN THE 12 O'CLOCK POSITION OF THE RIGHT BREAST CONTAINING A BIOPSY MARKER CLIP Using ultrasound guidance, sterile technique, 1% lidocaine and an I-125 radioactive seed, the mass containing a biopsy marker clip in the 12 o'clock position of the right breast, 1 cm from the nipple, was localized using a medial approach. The follow-up mammogram images confirm the seed in the expected location and were marked for Dr. Brantley Stage. Follow-up survey of the patient confirms presence of the radioactive seed. Order number of I-125 seed:  [id number]. Total activity:  0.247 mCi reference Date: 07/12/2020 SITE 2: MASS IN THE 11 O'CLOCK POSITION OF THE RIGHT BREAST CONTAINING A BIOPSY MARKER CLIP Using ultrasound guidance, sterile technique, 1% lidocaine and an I-125 radioactive seed, the mass containing a biopsy marker clip in the 11 o'clock position of the right breast, 3 cm from the nipple, was localized using a medial approach. The follow-up mammogram images confirm the seed in the expected location and were marked for Dr. Brantley Stage. Follow-up survey of the patient confirms presence of the radioactive seed. Order number of I-125 seed:  [id number]. Total activity:  0.247 mCi reference Date: 07/12/2020 SITE 3: RIGHT AXILLARY LYMPH NODE CONTAINING A BIOPSY MARKER CLIP Using ultrasound guidance, sterile technique, 1% lidocaine and an I-125 radioactive seed, the abnormal right axillary lymph node containing a biopsy marker clip was localized using an inferolateral approach. The follow-up mammogram images confirm the seed in the expected location and were marked for Dr. Brantley Stage. Follow-up survey of the patient confirms presence of the radioactive seed. Order number of I-125 seed:  [id number]. Total activity:  0.247 mCi reference Date: 07/12/2020 The patient tolerated the procedures well  and was released from the [facility]. She was given instructions regarding seed removal. IMPRESSION: Radioactive seed localization right breast x 2 and right axilla. No apparent complications. Electronically Signed   By: Claudie Revering M.D.   On: 07/25/2020 12:34   [place] RT RADIOACTIVE SEED EA ADD LESION  Result Date: 07/25/2020 CLINICAL DATA:  Right breast invasive ductal carcinoma diagnosed in the  12 o'clock and 11 o'clock positions of the breast as well as a metastatic right axillary lymph node in September 2021. She has subsequently undergone neoadjuvant chemotherapy. Pre lumpectomy localizations. EXAM: ULTRASOUND GUIDED RADIOACTIVE SEED LOCALIZATION OF THE RIGHT BREAST X 2 ULTRASOUND-GUIDED RADIOACTIVE SEED LOCALIZATION THE RIGHT AXILLA COMPARISON:  Previous exam(s). FINDINGS: Patient presents for radioactive seed localizations prior to right lumpectomy. I met with the patient and we discussed the procedure of seed localization including benefits and alternatives. We discussed the high likelihood of successful procedures. We discussed the risks of the procedures including infection, bleeding, tissue injury and further surgery. We discussed the low dose of radioactivity involved in the procedures. Informed, written consent was given. The usual time-out protocol was performed immediately prior to the procedures. SITE 1: MASS IN THE 12 O'CLOCK POSITION OF THE RIGHT BREAST CONTAINING A BIOPSY MARKER CLIP Using ultrasound guidance, sterile technique, 1% lidocaine and an I-125 radioactive seed, the mass containing a biopsy marker clip in the 12 o'clock position of the right breast, 1 cm from the nipple, was localized using a medial approach. The follow-up mammogram images confirm the seed in the expected location and were marked for Dr. Brantley Stage. Follow-up survey of the patient confirms presence of the radioactive seed. Order number of I-125 seed:  [id number]. Total activity:  0.247 mCi reference Date: 07/12/2020  SITE 2: MASS IN THE 11 O'CLOCK POSITION OF THE RIGHT BREAST CONTAINING A BIOPSY MARKER CLIP Using ultrasound guidance, sterile technique, 1% lidocaine and an I-125 radioactive seed, the mass containing a biopsy marker clip in the 11 o'clock position of the right breast, 3 cm from the nipple, was localized using a medial approach. The follow-up mammogram images confirm the seed in the expected location and were marked for Dr. Brantley Stage. Follow-up survey of the patient confirms presence of the radioactive seed. Order number of I-125 seed:  [id number]. Total activity:  0.247 mCi reference Date: 07/12/2020 SITE 3: RIGHT AXILLARY LYMPH NODE CONTAINING A BIOPSY MARKER CLIP Using ultrasound guidance, sterile technique, 1% lidocaine and an I-125 radioactive seed, the abnormal right axillary lymph node containing a biopsy marker clip was localized using an inferolateral approach. The follow-up mammogram images confirm the seed in the expected location and were marked for Dr. Brantley Stage. Follow-up survey of the patient confirms presence of the radioactive seed. Order number of I-125 seed:  [id number]. Total activity:  0.247 mCi reference Date: 07/12/2020 The patient tolerated the procedures well and was released from the [facility]. She was given instructions regarding seed removal. IMPRESSION: Radioactive seed localization right breast x 2 and right axilla. No apparent complications. Electronically Signed   By: Claudie Revering M.D.   On: 07/25/2020 12:34   MM CLIP PLACEMENT RIGHT  Result Date: 07/25/2020 CLINICAL DATA:  Status post ultrasound-guided radioactive seed localization invasive ductal carcinomas in the 12 o'clock and 11 o'clock position of the right breast and a metastatic right axillary lymph node. EXAM: DIAGNOSTIC RIGHT MAMMOGRAM POST ULTRASOUND-GUIDED RADIOACTIVE SEED PLACEMENT X 3 COMPARISON:  Previous exam(s). FINDINGS: Mammographic images were obtained following ultrasound-guided radioactive seed placement x 3.  These demonstrate a radioactive seed adjacent to the ribbon shaped biopsy marker clip in the 12 o'clock position of the right breast, a radioactive seed adjacent to the coil shaped biopsy marker clip in the 11 o'clock position of the right breast and a radioactive seed in the previously biopsied right axillary lymph node. The HydroMARK clip in the right axilla again could not be included but was well seen  at ultrasound in the biopsied and localized lymph node. IMPRESSION: Appropriate positions of 2 radioactive seeds in the right breast and 1 radioactive seed in the right axilla. Final Assessment: Post Procedure Mammograms for Seed Placement Electronically Signed   By: Claudie Revering M.D.   On: 07/25/2020 11:43     ELIGIBLE FOR AVAILABLE RESEARCH PROTOCOL: AET  ASSESSMENT: 65 y.o. [place] woman status post right breast upper outer quadrant (12:00) and axillary lymph node biopsy 02/03/2020, both positive for a clinical T1c N1, stage IIB invasive ductal carcinoma, grade 3, estrogen receptor positive, HER-2 and progesterone receptor negative, with an MIB-1 of 60%.  (a) biopsy of a second right upper outer quadrant T2 lesion (11 o'clock) 02/06/2020 also showed invasive ductal carcinoma, but grade 2, estrogen receptor positive, progesterone receptor and HER-2 negative, with an MIB-1 of 15%  (b) biopsy of a third right breast lesion (1:00) was benign   (1) neoadjuvant chemotherapy consisting of cyclophosphamide and docetaxel every 21 days x 4 starting 03/05/2020  (a) discontinued after 2 cycles because of peripheral neuropathy  (b) cyclophosphamide/doxorubicin started 04/24/2020, repeated every 21 days x4, completed 06/25/2020  (c) echo 04/17/2020 showed EF of 55-60%  (2) status post right lumpectomy and sentinel lymph node sampling 07/26/2020 for a residual ypT1c ypN1 invasive ductal carcinoma, with negative margins  (a) a total of 6 right axillary lymph nodes removed, 2 positive  (3) adjuvant radiation  pending  (4) antiestrogens to follow  (5) CT scan of the chest 04/23/2020 shows a left brachiocephalicl catheter associated clot  (a) rivaroxaban started 04/24/2020   PLAN: [name] [name] to get on video and we could see each other but not here each other.  I tried to call her on her phone but it went straight to voicemail and then told me the voicemail was full.  She is coming for simulation 08/29/2020 at 9 AM and I will have her drop in and see me right after the procedure.   Virgie Dad. Nina Mondor, MD 08/22/20 8:16 PM Medical Oncology and Hematology [facility] [place], [place] [address] Tel. [phone]    Fax. [phone]   I, Wilburn Mylar, am acting as scribe for Dr. Virgie Dad. Amay Mijangos.  I, Lurline Del MD, have reviewed the above documentation for accuracy and completeness, and I agree with the above.   *Total Encounter Time as defined by the Centers for Medicare and Medicaid Services includes, in addition to the face-to-face time of a patient visit (documented in the note above) non-face-to-face time: obtaining and reviewing outside history, ordering and reviewing medications, tests or procedures, care coordination (communications with other health care professionals or caregivers) and documentation in the medical record.

## 2020-08-22 ENCOUNTER — Inpatient Hospital Stay: Payer: Self-pay | Attending: Oncology | Admitting: Oncology

## 2020-08-22 ENCOUNTER — Encounter: Payer: Self-pay | Admitting: Oncology

## 2020-08-22 DIAGNOSIS — Z170 Estrogen receptor positive status [ER+]: Secondary | ICD-10-CM | POA: Insufficient documentation

## 2020-08-22 DIAGNOSIS — C50411 Malignant neoplasm of upper-outer quadrant of right female breast: Secondary | ICD-10-CM | POA: Insufficient documentation

## 2020-08-22 DIAGNOSIS — Z79899 Other long term (current) drug therapy: Secondary | ICD-10-CM | POA: Insufficient documentation

## 2020-08-24 NOTE — Progress Notes (Incomplete)
Location of Breast Cancer: Malignant neoplasm of upper-outer quadrant of RIGHT breast, estrogen receptor positive   Histology per Pathology Report:  07/26/2020 FINAL MICROSCOPIC DIAGNOSIS:  A. BREAST, RIGHT SUBAREOLAR, LUMPECTOMY:  - No residual invasive carcinoma status post neoadjuvant treatment.  - See oncology table.  B. BREAST, RIGHT LATERAL, LUMPECTOMY:  - Residual invasive ductal carcinoma status post neoadjuvant treatment.  - Margins of resection are not involved (Closest margin: 1-2 mm, lateral).  - Biopsy site.  - See oncology table.  C. BREAST, RIGHT ANTERIOSUPERIOR MARGIN, EXCISION:  - Breast tissue, negative for carcinoma.  D. BREAST, RIGHT MEDIOPOSTERIOR MARGIN, EXCISION:  - Breast tissue, negative for carcinoma.  E. BREAST, RIGHT INFERIOLATERAL MARGIN, EXCISION:  - Breast tissue, negative for carcinoma.  F. 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  - Biopsy site.  G. 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  H. SENTINEL LYMPH NODE, RIGHT, BIOPSY:  - One lymph node, negative for carcinoma (0/1).  I. SENTINEL LYMPH NODE, RIGHT, BIOPSY:  - One lymph node, negative for carcinoma (0/1).  J. SENTINEL LYMPH NODE, RIGHT, BIOPSY:  - One lymph node, negative for carcinoma (0/1).  K. SENTINEL LYMPH NODE, RIGHT, BIOPSY:  - One lymph node, negative for carcinoma (0/1).  ONCOLOGY TABLE - A:  INVASIVE CARCINOMA OF THE BREAST, STATUS POST NEOADJUVANT TREATMENT: Resection   Receptor Status: ER(100%; breast and lymph node), PR (0%; breast and lymph node), Her2-neu (Negative via FISH; breast and lymph node), Ki-67(60% breast; 70% lymph node)  Did patient present with symptoms (if so, please note symptoms) or was this found on screening mammography?:  Patient palpated a mass in the right axilla "a long time ago", more recently with complaints of associated pain. She brought it to medical  attention and underwent bilateral diagnostic mammography with tomography and bilateral breast ultrasonography at [facility] on 01/31/2020 showing: breast density category B; three right breast masses-- 1.9 cm at 12 o'clock, 2.3 cm at 11 o'clock, 0.4 cm at 1 o'clock; two adjacent enlarged thickened lymph nodes measuring up to 2.1 cm; no evidence of left breast malignancy.  Past/Anticipated interventions by surgeon, if any: 07/26/2020 Dr. Erroll Luna --Right breast seed lumpectomy x2 with targeted right axillary sentinel lymph node mapping --sentinel lymph node mapping  --completion right axillary lymph node dissection  --port removal  Past/Anticipated interventions by medical oncology, if any:  Under care of Dr. Sarajane Jews Magrinat 08/22/2020 (1) neoadjuvant chemotherapy consisting of cyclophosphamide and docetaxel every 21 days x 4 starting 03/05/2020             (a) discontinued after 2 cycles because of peripheral neuropathy             (b) cyclophosphamide/doxorubicin started 04/24/2020, repeated every 21 days x4, completed 06/25/2020             (c) echo 04/17/2020 showed EF of 55-60% (2) status post right lumpectomy and sentinel lymph node sampling 07/26/2020 for a residual ypT1c ypN1 invasive ductal carcinoma, with negative margins             (a) a total of 6 right axillary lymph nodes removed, 2 positive (3) adjuvant radiation pending (4) antiestrogens to follow (5) CT scan of the chest 04/23/2020 shows a left brachiocephalicl catheter associated clot             (a) rivaroxaban started 04/24/2020 --She is coming for simulation 08/29/2020 at 9 AM and  I will have her drop in and see me right after the procedure.  Lymphedema issues, if any:  ***    Pain issues, if any:  ***   SAFETY ISSUES:  Prior radiation? No  Pacemaker/ICD? No  Possible current pregnancy? No-postmenopausal  Is the patient on methotrexate? 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 Lives with her daughter, son-in-law and 2 grandchildren

## 2020-08-27 ENCOUNTER — Encounter: Payer: Self-pay | Admitting: Radiation Oncology

## 2020-08-27 ENCOUNTER — Inpatient Hospital Stay
Admission: RE | Admit: 2020-08-27 | Discharge: 2020-08-27 | Disposition: A | Discharge status: Home / Self Care | Payer: No Typology Code available for payment source | Source: Ambulatory Visit | Attending: Radiation Oncology | Admitting: Radiation Oncology

## 2020-08-27 DIAGNOSIS — C50411 Malignant neoplasm of upper-outer quadrant of right female breast: Secondary | ICD-10-CM

## 2020-08-27 NOTE — Progress Notes (Signed)
Radiation Oncology         [phone]) [phone] ________________________________  Name: [name] [name] MRN: [id number]  Date: 08/27/2020  DOB: July 15, 1955  Follow-Up Visit Note   The patient opted for telemedicine to maximize safety during the pandemic.  MyChart video was used.   Outpatient  CC: Kristie Cowman, MD  Magrinat, Virgie Dad, MD  Diagnosis:      ICD-10-CM   1. Malignant neoplasm of upper-outer quadrant of right breast in female, estrogen receptor positive ([place])  C50.411    Z17.0     Cancer Staging Malignant neoplasm of upper-outer quadrant of right breast in female, estrogen receptor positive ([place]) Staging form: Breast, AJCC 8th Edition - Clinical stage from 02/06/2020: Stage IIIA (cT2, cN1, cM0, G3, ER+, PR-, HER2-) - Signed by Eppie Gibson, MD on 02/22/2020 Stage prefix: Initial diagnosis Histologic grading system: 3 grade system - Pathologic stage from 07/26/2020: No Stage Recommended (ypT1c, pN1a, cM0) - Signed by Gardenia Phlegm, NP on 08/08/2020 Stage prefix: Post-therapy   CHIEF COMPLAINT: Here to discuss management of right breast cancer  Narrative:  The patient returns today for follow-up. She is doing quite well.  She underwent neoadjuvant chemotherapy including cyclophosphamide and docetaxel which was adjusted due to peripheral neuropathy.  She continued with cyclophosphamide and doxorubicin.  She underwent her first MRI of her breast on 07/02/2020.  This revealed a 1.6 cm mass at 11:00 of the right breast.  1-2 abnormal appearing lymph nodes in the axilla on the right.  No other evidence of malignancy noted.  She ultimately proceeded with breast conserving surgery and sentinel lymph node biopsy on 07/26/2020.  2 out of 6 lymph nodes were positive with extranodal extension..  Subareolar lumpectomy showed no residual carcinoma.  Right lateral lumpectomy revealed tumor that was was 1.2 cm.  Grade 2.  Histology invasive ductal carcinoma.  Margins negative.  She is doing  well.  She is pleased with her progress thus far.         ALLERGIES:  is allergic to sulfa antibiotics.  Meds: Current Outpatient Medications  Medication Sig Dispense Refill  . acetaminophen (TYLENOL) 500 MG tablet Take 500 mg by mouth 3 (three) times daily as needed.    Marland Kitchen amLODipine (NORVASC) 5 MG tablet Take 5 mg by mouth daily.    Marland Kitchen atorvastatin (LIPITOR) 40 MG tablet Take 40 mg by mouth daily.    . carvedilol (COREG) 6.25 MG tablet Take 6.25 mg by mouth 2 (two) times daily with a meal.    . celecoxib (CELEBREX) 200 MG capsule Take 200 mg by mouth daily.     Marland Kitchen ibuprofen (ADVIL) 800 MG tablet Take 1 tablet (800 mg total) by mouth every 8 (eight) hours as needed. 30 tablet 0  . lansoprazole (PREVACID) 30 MG capsule Take 30 mg by mouth every morning.    Marland Kitchen losartan (COZAAR) 100 MG tablet Take 100 mg by mouth daily.    . metFORMIN (GLUCOPHAGE) 1000 MG tablet Take 1,000 mg by mouth daily with breakfast.     . oxyCODONE (OXY IR/ROXICODONE) 5 MG immediate release tablet Take 1 tablet (5 mg total) by mouth every 6 (six) hours as needed for severe pain. 15 tablet 0  . prochlorperazine (COMPAZINE) 10 MG tablet Take 1 tablet (10 mg total) by mouth every 6 (six) hours as needed (Nausea or vomiting). 30 tablet 1  . rivaroxaban (XARELTO) 20 MG TABS tablet Take 1 tablet (20 mg total) by mouth daily with supper. 35 tablet 0  .  rivaroxaban (XARELTO) 20 MG TABS tablet TAKE 1 TABLET BY MOUTH ONCE A DAY WITH SUPPER 90 tablet 2   No current facility-administered medications for this encounter.    Physical Findings:  vitals were not taken for this visit. .     General: Alert and oriented, in no acute distress   Lab Findings: Lab Results  Component Value Date   WBC 3.2 (L) 07/20/2020   HGB 10.9 (L) 07/20/2020   HCT 33.5 (L) 07/20/2020   MCV 99.7 07/20/2020   PLT 357 07/20/2020    Radiographic Findings: As above   Impression/Plan: We discussed adjuvant radiotherapy today.  I recommend  radiation therapy to the right breast and regional nodes in order to reduce risk of local regional recurrence by two thirds.  I reviewed the logistics, benefits, risks, and potential side effects of this treatment in detail. Risks may include but not necessary be limited to acute and late injury tissue in the radiation fields such as skin irritation (change in color/pigmentation, itching, dryness, pain, peeling). She may experience fatigue. We also discussed possible risk of long term cosmetic changes or scar tissue. There is also a smaller risk for lung toxicity, cardiac toxicity, brachial plexopathy, lymphedema, musculoskeletal changes, rib fragility or induction of a second malignancy, late chronic non-healing soft tissue wound.    The patient asked good questions which I answered to her satisfaction. She is enthusiastic about proceeding with treatment. A consent form has been signed and placed in her chart by myself and Althia Forts, RN who recorded verbal consent via telemedicine.  The patient will present later this week for CT simulation with plan for her to sign a consent form in person at that time.  Anticipate starting her treatment in approximately 1 week after CT simulation.    This encounter was provided by telemedicine platform; patient desired telemedicine during pandemic precautions.  MyChart video was used. The patient has given verbal consent for this type of encounter and has been advised to only accept a meeting of this type in a secure network environment. On date of service, in total, I spent 45 minutes on this encounter.   The attendants for this meeting include Eppie Gibson  and [name] During the encounter, Eppie Gibson was located at [facility] Radiation Oncology Department.  Ermalinda Joubert was located at home.   _____________________________________   Eppie Gibson, MD

## 2020-08-28 ENCOUNTER — Ambulatory Visit: Payer: No Typology Code available for payment source | Admitting: Radiation Oncology

## 2020-08-29 ENCOUNTER — Other Ambulatory Visit: Payer: Self-pay

## 2020-08-29 ENCOUNTER — Ambulatory Visit
Admission: RE | Admit: 2020-08-29 | Discharge: 2020-08-29 | Disposition: A | Discharge status: Home / Self Care | Payer: Self-pay | Source: Ambulatory Visit | Attending: Radiation Oncology | Admitting: Radiation Oncology

## 2020-08-29 ENCOUNTER — Inpatient Hospital Stay (HOSPITAL_BASED_OUTPATIENT_CLINIC_OR_DEPARTMENT_OTHER): Payer: No Typology Code available for payment source | Admitting: Oncology

## 2020-08-29 DIAGNOSIS — Z170 Estrogen receptor positive status [ER+]: Secondary | ICD-10-CM

## 2020-08-29 DIAGNOSIS — C50411 Malignant neoplasm of upper-outer quadrant of right female breast: Secondary | ICD-10-CM | POA: Insufficient documentation

## 2020-08-29 DIAGNOSIS — Z510 Encounter for antineoplastic radiation therapy: Secondary | ICD-10-CM | POA: Insufficient documentation

## 2020-08-29 NOTE — Progress Notes (Signed)
[name] [name]  [phone] [phone]     ID: [name] [name] DOB: [birth year]  MR#: [id number]  CSN#:[id number]  Patient Care Team: Jones, Enrico, MD as PCP - General (Family Medicine) Stuart, Dawn C, RN as Oncology Nurse Navigator Martini, Keisha N, RN as Oncology Nurse Navigator Susumu Hackler C, MD as Consulting Physician (Oncology) Cornett, Thomas, MD as Consulting Physician (General Surgery) Squire, Sarah, MD as Attending Physician (Radiation Oncology) Monda Chastain C Daya Dutt, MD OTHER MD:  CHIEF COMPLAINT: Estrogen receptor positive breast cancer  CURRENT TREATMENT: Adjuvant radiation   INTERVAL HISTORY: Taneeka returns today for follow up of her estrogen receptor positive breast cancer.   Since her last visit, she underwent breast MRI on 07/02/2020 showing: breast composition B; 1.6 cm enhancing mass at site of known malignancy at 11 o'clock; no enhancement seen at biopsy-proven malignancy at 12 o'clock; at least one, possibly two, abnormal-appearing level 1 lymph nodes in right axilla; no evidence left breast malignancy.  She underwent right breast lumpectomy x2 on 07/26/2020 under Dr. Cornett.  This showed a complete resolution of the right subareolar mass.  The more lateral mass had shrunk to 1.2cm.  A total of 2 out of 6 lymph nodes removed had micrometastatic deposits in them.  The repeat prognostic panel found this tumor to be estrogen receptor positive, HER2 and progesterone receptor negative, with an MIB-1 of 2%.  The patient is now receiving radiation which she is tolerating well   REVIEW OF SYSTEMS: Adriauna still has a little bit of numbness in her hands.  This is not progressive.  She has problems with insomnia.  She says her brain just keeps going.  She has some numbness in the medial aspect of the right upper extremity and also some discomfort in the axilla.  There has been no lymphedema.  She did go to physical therapy and she was measured"  everything was fine".  A detailed review of systems was otherwise stable.   COVID 19 VACCINATION STATUS: s/p  [employer]  X2, most recently June 2021, no booster as of 05/01/2020   HISTORY OF CURRENT ILLNESS: From the original intake note:  [name] [name] herself palpated a mass in the right axilla "a long time ago", more recently with complaints of associated pain. She brought it to medical attention and underwent bilateral diagnostic mammography with tomography and bilateral breast ultrasonography at [name] on 01/31/2020 showing: breast density category B; three right breast masses-- 1.9 cm at 12 o'clock, 2.3 cm at 11 o'clock, 0.4 cm at 1 o'clock; two adjacent enlarged thickened lymph nodes measuring up to 2.1 cm; no evidence of left breast malignancy.  Accordingly on 02/03/2020 she proceeded to biopsy of the right breast area at 12 o'clock and right axilla. The pathology from this procedure ([id number]) showed: invasive ductal carcinoma, grade 3. Prognostic indicators significant for: estrogen receptor, 100% positive with strong staining intensity and progesterone receptor, 0% negative. Proliferation marker Ki67 at 60%. HER2 equivocal by immunohistochemistry (2+), but negative by fluorescent in situ hybridization with a signals ratio 0.81 and number per cell 2.7.  The biopsied lymph node was found to show metastatic carcinoma. The morphology was considered different from the biopsied mass, and a prognostic panel was performed. Estrogen receptor, 100% positive with strong staining intensity and progesterone receptor, 0% negative. Proliferation marker Ki67 at 70%. HER2 equivcoal by immunohistochemistry (2+), but negative) by fluorescent in situ hybridization with a signals ratio 1.16 and number per cell 3.9.  She underwent additional right breast   biopsies on 02/06/2020. Pathology ([id number]) from the mass at 11 o'clock showed: invasive ductal carcinoma, grade 2. Prognostic indicators  significant for: estrogen receptor, 100% positive with strong staining intensity and progesterone receptor, 0% negative. Proliferation marker Ki67 at 15%. HER2 equivocal by immunohistochemistry (2+), but negative by fluorescent in situ hybridization with a signals ratio 1.29 and number per cell 2.7.  The biopsied mass at 1 o'clock showed only fibrocystic change.  The patient's subsequent history is as detailed below.   PAST MEDICAL HISTORY: Past Medical History:  Diagnosis Date  . Arthritis   . Cancer (HCC)    breast  . Diabetes mellitus without complication (HCC)   . Hypertension     PAST SURGICAL HISTORY: Past Surgical History:  Procedure Laterality Date  . BREAST LUMPECTOMY WITH RADIOACTIVE SEED AND SENTINEL LYMPH NODE BIOPSY Right 07/26/2020   Procedure: RIGHT BREAST LUMPECTOMY WITH RADIOACTIVE SEED X 2 AND SENTINEL LYMPH NODE BIOPSY;  Surgeon: Cornett, Thomas, MD;  Location: [name];  Service: General;  Laterality: Right;  . IR IMAGING GUIDED PORT INSERTION  02/23/2020  . PORT-A-CATH REMOVAL N/A 07/26/2020   Procedure: REMOVAL PORT-A-CATH;  Surgeon: Cornett, Thomas, MD;  Location: [name];  Service: General;  Laterality: N/A;    FAMILY HISTORY: Family History  Problem Relation Age of Onset  . Asthma Mother   . Arthritis Sister   . Asthma Brother   . Arthritis Brother   [name] died in his 60s from causes unknown to the patient.  [name] died from heart disease at age 69.  The patient had 4 brothers, 1 sister, with no history of cancer in the family to her knowledge   GYNECOLOGIC HISTORY:  No LMP recorded. Patient is postmenopausal. Menarche: 65 years old Age at first live birth: 65 years old GX P 1 LMP 50s Contraceptive HRT no  Hysterectomy? no BSO? no   SOCIAL HISTORY: (updated 02/2020)  Emelynn is originally from [place].  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  She describes herself as  single.  At home she lives with her daughter [name] [name] who works for [employer], her daughters husband [name] [name] who is a tattoo artist, and their children who are 7 months old and 6 years old.  The patient is in [place]    ADVANCED DIRECTIVES: Not in place.  The patient tells me she would intend to name her daughter [name] as her healthcare power of attorney   HEALTH MAINTENANCE: Social History   Tobacco Use  . Smoking status: Never Smoker  . Smokeless tobacco: Never Used  Vaping Use  . Vaping Use: Never used  Substance Use Topics  . Alcohol use: Yes    Alcohol/week: 1.0 standard drink    Types: 1 Glasses of wine per week    Comment: occasionally  . Drug use: Never     Colonoscopy: never done  PAP: 2017 (performed in [place])  Bone density:    Allergies  Allergen Reactions  . Sulfa Antibiotics Itching    Current Outpatient Medications  Medication Sig Dispense Refill  . acetaminophen (TYLENOL) 500 MG tablet Take 500 mg by mouth 3 (three) times daily as needed.    . amLODipine (NORVASC) 5 MG tablet Take 5 mg by mouth daily.    . atorvastatin (LIPITOR) 40 MG tablet Take 40 mg by mouth daily.    . carvedilol (COREG) 6.25 MG tablet Take 6.25 mg by mouth 2 (two) times daily   with a meal.    . ibuprofen (ADVIL) 800 MG tablet Take 1 tablet (800 mg total) by mouth every 8 (eight) hours as needed. 30 tablet 0  . lansoprazole (PREVACID) 30 MG capsule Take 30 mg by mouth every morning.    . losartan (COZAAR) 100 MG tablet Take 100 mg by mouth daily.    . metFORMIN (GLUCOPHAGE) 1000 MG tablet Take 1,000 mg by mouth daily with breakfast.      No current facility-administered medications for this visit.    OBJECTIVE: African-American woman in no acute distress  There were no vitals filed for this visit.   There is no height or weight on file to calculate BMI.   Wt Readings from Last 3 Encounters:  07/26/20 207 lb 3.7 oz (94 kg)  07/18/20 212 lb 12.8 oz (96.5 kg)   06/25/20 213 lb 12.8 oz (97 kg)      ECOG FS:1 - Symptomatic but completely ambulatory  Sclerae unicteric, EOMs intact Wearing a mask No cervical or supraclavicular adenopathy Lungs no rales or rhonchi Heart regular rate and rhythm Abd soft, nontender, positive bowel sounds MSK no focal spinal tenderness, no right upper extremity lymphedema or erythema Neuro: nonfocal, well oriented, appropriate affect Breasts: The right breast is status post lumpectomy and is currently receiving radiation.  The cosmetic result is very good.  There is no evidence of residual or recurrent disease.  The left breast and both axillae are benign.  LAB RESULTS:  CMP     Component Value Date/Time   NA 138 07/20/2020 1530   K 4.4 07/20/2020 1530   CL 105 07/20/2020 1530   CO2 27 07/20/2020 1530   GLUCOSE 102 (H) 07/20/2020 1530   BUN 5 (L) 07/20/2020 1530   CREATININE 0.53 07/20/2020 1530   CREATININE 0.76 04/02/2020 0900   CALCIUM 9.5 07/20/2020 1530   PROT 6.2 (L) 07/20/2020 1530   ALBUMIN 4.0 07/20/2020 1530   AST 28 07/20/2020 1530   AST 10 (L) 04/02/2020 0900   ALT 21 07/20/2020 1530   ALT 14 04/02/2020 0900   ALKPHOS 59 07/20/2020 1530   BILITOT 0.4 07/20/2020 1530   BILITOT 0.6 04/02/2020 0900   GFRNONAA >60 07/20/2020 1530   GFRNONAA >60 04/02/2020 0900    No results found for: TOTALPROTELP, ALBUMINELP, A1GS, A2GS, BETS, BETA2SER, GAMS, MSPIKE, SPEI  Lab Results  Component Value Date   WBC 3.2 (L) 07/20/2020   NEUTROABS 1.6 (L) 07/20/2020   HGB 10.9 (L) 07/20/2020   HCT 33.5 (L) 07/20/2020   MCV 99.7 07/20/2020   PLT 357 07/20/2020    No results found for: LABCA2  No components found for: LABCAN125  No results for input(s): INR in the last 168 hours.  No results found for: LABCA2  No results found for: CAN199  No results found for: CAN125  No results found for: CAN153  No results found for: CA2729  No components found for: HGQUANT  No results found for: CEA1 /  No results found for: CEA1   No results found for: AFPTUMOR  No results found for: CHROMOGRNA  No results found for: KPAFRELGTCHN, LAMBDASER, KAPLAMBRATIO (kappa/lambda light chains)  No results found for: HGBA, HGBA2QUANT, HGBFQUANT, HGBSQUAN (Hemoglobinopathy evaluation)   No results found for: LDH  No results found for: IRON, TIBC, IRONPCTSAT (Iron and TIBC)  No results found for: FERRITIN  Urinalysis No results found for: COLORURINE, APPEARANCEUR, LABSPEC, PHURINE, GLUCOSEU, HGBUR, BILIRUBINUR, KETONESUR, PROTEINUR, UROBILINOGEN, NITRITE, LEUKOCYTESUR   STUDIES: No results found.     ELIGIBLE FOR AVAILABLE RESEARCH PROTOCOL: AET  ASSESSMENT: 64 y.o. [place] woman status post right breast upper outer quadrant (12:00) and axillary lymph node biopsy 02/03/2020, both positive for a clinical T1c N1, stage IIB invasive ductal carcinoma, grade 3, estrogen receptor positive, HER-2 and progesterone receptor negative, with an MIB-1 of 60%.  (a) biopsy of a second right upper outer quadrant T2 lesion (11 o'clock) 02/06/2020 also showed invasive ductal carcinoma, but grade 2, estrogen receptor positive, progesterone receptor and HER-2 negative, with an MIB-1 of 15%  (b) biopsy of a third right breast lesion (1:00) was benign   (1) neoadjuvant chemotherapy consisting of cyclophosphamide and docetaxel every 21 days x 4 starting 03/05/2020  (a) discontinued after 2 cycles because of peripheral neuropathy  (b) cyclophosphamide/doxorubicin started 04/24/2020, repeated every 21 days x4, completed 06/25/2020  (c) echo 04/17/2020 showed EF of 55-60%  (2) right lumpectomy 07/26/2020 found a residual pT1c pN1 invasive ductal carcinoma, grade 2, with negative margins  (a) repeat prognostic indicators show the tumor to be estrogen receptor positive, progesterone receptor and HER2 negative, with an MIB-1 of 2%.  (b) a total of 6 right axillary lymph nodes removed, 2 positive  (3) adjuvant  radiation to be completed 10/17/2020  (4) aanastrozole to follow  (5) CT scan of the chest 04/23/2020 shows a left brachiocephalicl catheter associated clot  (a) rivaroxaban started 04/24/2020, discontinued 08/22/2020  (b) port removed 07/26/2020   PLAN: Shaneeka is tolerating radiation well.  Today we reviewed the results of her surgery and she understands one of the 2 tumors in the breast have completely resolved.  The other one did shrink.  She will complete her radiation in early June and she will return to see me in early July.  At that time we will start her on anastrozole.  She wanted to know the stage of her tumor and other fax and I was glad to write all that down for her.  She stopped the rivaroxaban a week ago.  There has been no lymphedema on either side.  She will let me know if there is any swelling redness or discomfort in the right arm at any point  Total encounter time 20 minutes.*   Gustav C. Magrinat, MD 08/29/20 10:28 AM Medical Oncology and Hematology [name] [address] [place], [place] [address] Tel. [phone]    Fax. [phone]   I, Katie Daubenspeck, am acting as scribe for Dr. Gustav C. Magrinat.  I, Gustav Magrinat MD, have reviewed the above documentation for accuracy and completeness, and I agree with the above.   *Total Encounter Time as defined by the Centers for Medicare and Medicaid Services includes, in addition to the face-to-face time of a patient visit (documented in the note above) non-face-to-face time: obtaining and reviewing outside history, ordering and reviewing medications, tests or procedures, care coordination (communications with other health care professionals or caregivers) and documentation in the medical record. 

## 2020-08-30 ENCOUNTER — Telehealth: Payer: Self-pay | Admitting: Oncology

## 2020-08-30 NOTE — Telephone Encounter (Signed)
Scheduled appt per 4/20 los. Pt aware.

## 2020-09-03 ENCOUNTER — Encounter: Payer: Self-pay | Admitting: *Deleted

## 2020-09-05 ENCOUNTER — Other Ambulatory Visit: Payer: Self-pay

## 2020-09-05 ENCOUNTER — Ambulatory Visit
Admission: RE | Admit: 2020-09-05 | Discharge: 2020-09-05 | Disposition: A | Discharge status: Home / Self Care | Payer: Self-pay | Source: Ambulatory Visit | Attending: Radiation Oncology | Admitting: Radiation Oncology

## 2020-09-06 ENCOUNTER — Ambulatory Visit
Admission: RE | Admit: 2020-09-06 | Discharge: 2020-09-06 | Disposition: A | Discharge status: Home / Self Care | Payer: Self-pay | Source: Ambulatory Visit | Attending: Radiation Oncology | Admitting: Radiation Oncology

## 2020-09-07 ENCOUNTER — Ambulatory Visit
Admission: RE | Admit: 2020-09-07 | Discharge: 2020-09-07 | Disposition: A | Discharge status: Home / Self Care | Payer: Self-pay | Source: Ambulatory Visit | Attending: Radiation Oncology | Admitting: Radiation Oncology

## 2020-09-07 ENCOUNTER — Other Ambulatory Visit: Payer: Self-pay

## 2020-09-10 ENCOUNTER — Ambulatory Visit
Admission: RE | Admit: 2020-09-10 | Discharge: 2020-09-10 | Disposition: A | Discharge status: Home / Self Care | Payer: No Typology Code available for payment source | Source: Ambulatory Visit | Attending: Radiation Oncology | Admitting: Radiation Oncology

## 2020-09-10 DIAGNOSIS — Z170 Estrogen receptor positive status [ER+]: Secondary | ICD-10-CM | POA: Insufficient documentation

## 2020-09-10 DIAGNOSIS — C50411 Malignant neoplasm of upper-outer quadrant of right female breast: Secondary | ICD-10-CM | POA: Insufficient documentation

## 2020-09-10 MED ORDER — ALRA NON-METALLIC DEODORANT (RAD-ONC)
1.0000 "application " | Freq: Once | TOPICAL | Status: AC
  Administered 2020-09-10: 1 via TOPICAL

## 2020-09-10 MED ORDER — RADIAPLEXRX EX GEL: Freq: Once | CUTANEOUS | Status: AC

## 2020-09-10 NOTE — Progress Notes (Signed)

## 2020-09-11 ENCOUNTER — Other Ambulatory Visit: Payer: Self-pay

## 2020-09-11 ENCOUNTER — Ambulatory Visit
Admission: RE | Admit: 2020-09-11 | Discharge: 2020-09-11 | Disposition: A | Discharge status: Home / Self Care | Payer: No Typology Code available for payment source | Source: Ambulatory Visit | Attending: Radiation Oncology | Admitting: Radiation Oncology

## 2020-09-12 ENCOUNTER — Ambulatory Visit
Admission: RE | Admit: 2020-09-12 | Discharge: 2020-09-12 | Disposition: A | Discharge status: Home / Self Care | Payer: No Typology Code available for payment source | Source: Ambulatory Visit | Attending: Radiation Oncology | Admitting: Radiation Oncology

## 2020-09-13 ENCOUNTER — Other Ambulatory Visit: Payer: Self-pay

## 2020-09-13 ENCOUNTER — Ambulatory Visit
Admission: RE | Admit: 2020-09-13 | Discharge: 2020-09-13 | Disposition: A | Discharge status: Home / Self Care | Payer: No Typology Code available for payment source | Source: Ambulatory Visit | Attending: Radiation Oncology | Admitting: Radiation Oncology

## 2020-09-14 ENCOUNTER — Ambulatory Visit
Admission: RE | Admit: 2020-09-14 | Discharge: 2020-09-14 | Disposition: A | Discharge status: Home / Self Care | Payer: No Typology Code available for payment source | Source: Ambulatory Visit | Attending: Radiation Oncology | Admitting: Radiation Oncology

## 2020-09-17 ENCOUNTER — Other Ambulatory Visit: Payer: Self-pay

## 2020-09-17 ENCOUNTER — Ambulatory Visit
Admission: RE | Admit: 2020-09-17 | Discharge: 2020-09-17 | Disposition: A | Discharge status: Home / Self Care | Payer: No Typology Code available for payment source | Source: Ambulatory Visit | Attending: Radiation Oncology | Admitting: Radiation Oncology

## 2020-09-18 ENCOUNTER — Ambulatory Visit
Admission: RE | Admit: 2020-09-18 | Discharge: 2020-09-18 | Disposition: A | Discharge status: Home / Self Care | Payer: No Typology Code available for payment source | Source: Ambulatory Visit | Attending: Radiation Oncology | Admitting: Radiation Oncology

## 2020-09-19 ENCOUNTER — Other Ambulatory Visit: Payer: Self-pay

## 2020-09-19 ENCOUNTER — Ambulatory Visit
Admission: RE | Admit: 2020-09-19 | Discharge: 2020-09-19 | Disposition: A | Discharge status: Home / Self Care | Payer: No Typology Code available for payment source | Source: Ambulatory Visit | Attending: Radiation Oncology | Admitting: Radiation Oncology

## 2020-09-20 ENCOUNTER — Ambulatory Visit
Admission: RE | Admit: 2020-09-20 | Discharge: 2020-09-20 | Disposition: A | Discharge status: Home / Self Care | Payer: No Typology Code available for payment source | Source: Ambulatory Visit | Attending: Radiation Oncology | Admitting: Radiation Oncology

## 2020-09-21 ENCOUNTER — Other Ambulatory Visit: Payer: Self-pay

## 2020-09-21 ENCOUNTER — Ambulatory Visit
Admission: RE | Admit: 2020-09-21 | Discharge: 2020-09-21 | Disposition: A | Discharge status: Home / Self Care | Payer: No Typology Code available for payment source | Source: Ambulatory Visit | Attending: Radiation Oncology | Admitting: Radiation Oncology

## 2020-09-24 ENCOUNTER — Ambulatory Visit
Admission: RE | Admit: 2020-09-24 | Discharge: 2020-09-24 | Disposition: A | Discharge status: Home / Self Care | Payer: No Typology Code available for payment source | Source: Ambulatory Visit | Attending: Radiation Oncology | Admitting: Radiation Oncology

## 2020-09-24 DIAGNOSIS — C50411 Malignant neoplasm of upper-outer quadrant of right female breast: Secondary | ICD-10-CM

## 2020-09-24 MED ORDER — RADIAPLEXRX EX GEL: Freq: Once | CUTANEOUS | Status: AC

## 2020-09-25 ENCOUNTER — Ambulatory Visit
Admission: RE | Admit: 2020-09-25 | Discharge: 2020-09-25 | Disposition: A | Discharge status: Home / Self Care | Payer: No Typology Code available for payment source | Source: Ambulatory Visit | Attending: Radiation Oncology | Admitting: Radiation Oncology

## 2020-09-26 ENCOUNTER — Other Ambulatory Visit: Payer: Self-pay

## 2020-09-26 ENCOUNTER — Ambulatory Visit
Admission: RE | Admit: 2020-09-26 | Discharge: 2020-09-26 | Disposition: A | Discharge status: Home / Self Care | Payer: No Typology Code available for payment source | Source: Ambulatory Visit | Attending: Radiation Oncology | Admitting: Radiation Oncology

## 2020-09-27 ENCOUNTER — Ambulatory Visit
Admission: RE | Admit: 2020-09-27 | Discharge: 2020-09-27 | Disposition: A | Discharge status: Home / Self Care | Payer: No Typology Code available for payment source | Source: Ambulatory Visit | Attending: Radiation Oncology | Admitting: Radiation Oncology

## 2020-09-28 ENCOUNTER — Ambulatory Visit
Admission: RE | Admit: 2020-09-28 | Discharge: 2020-09-28 | Disposition: A | Discharge status: Home / Self Care | Payer: No Typology Code available for payment source | Source: Ambulatory Visit | Attending: Radiation Oncology | Admitting: Radiation Oncology

## 2020-09-28 ENCOUNTER — Other Ambulatory Visit: Payer: Self-pay

## 2020-10-01 ENCOUNTER — Other Ambulatory Visit: Payer: Self-pay

## 2020-10-01 ENCOUNTER — Ambulatory Visit
Admission: RE | Admit: 2020-10-01 | Discharge: 2020-10-01 | Disposition: A | Discharge status: Home / Self Care | Payer: No Typology Code available for payment source | Source: Ambulatory Visit | Attending: Radiation Oncology | Admitting: Radiation Oncology

## 2020-10-01 ENCOUNTER — Ambulatory Visit: Payer: No Typology Code available for payment source | Admitting: Radiation Oncology

## 2020-10-02 ENCOUNTER — Ambulatory Visit
Admission: RE | Admit: 2020-10-02 | Discharge: 2020-10-02 | Disposition: A | Discharge status: Home / Self Care | Payer: No Typology Code available for payment source | Source: Ambulatory Visit | Attending: Radiation Oncology | Admitting: Radiation Oncology

## 2020-10-03 ENCOUNTER — Ambulatory Visit
Admission: RE | Admit: 2020-10-03 | Discharge: 2020-10-03 | Disposition: A | Discharge status: Home / Self Care | Payer: No Typology Code available for payment source | Source: Ambulatory Visit | Attending: Radiation Oncology | Admitting: Radiation Oncology

## 2020-10-03 ENCOUNTER — Other Ambulatory Visit: Payer: Self-pay

## 2020-10-04 ENCOUNTER — Ambulatory Visit
Admission: RE | Admit: 2020-10-04 | Discharge: 2020-10-04 | Disposition: A | Discharge status: Home / Self Care | Payer: No Typology Code available for payment source | Source: Ambulatory Visit | Attending: Radiation Oncology | Admitting: Radiation Oncology

## 2020-10-05 ENCOUNTER — Ambulatory Visit
Admission: RE | Admit: 2020-10-05 | Discharge: 2020-10-05 | Disposition: A | Discharge status: Home / Self Care | Payer: No Typology Code available for payment source | Source: Ambulatory Visit | Attending: Radiation Oncology | Admitting: Radiation Oncology

## 2020-10-05 ENCOUNTER — Other Ambulatory Visit: Payer: Self-pay

## 2020-10-09 ENCOUNTER — Ambulatory Visit
Admission: RE | Admit: 2020-10-09 | Discharge: 2020-10-09 | Disposition: A | Discharge status: Home / Self Care | Payer: No Typology Code available for payment source | Source: Ambulatory Visit | Attending: Radiation Oncology | Admitting: Radiation Oncology

## 2020-10-09 ENCOUNTER — Other Ambulatory Visit: Payer: Self-pay

## 2020-10-10 ENCOUNTER — Ambulatory Visit
Admission: RE | Admit: 2020-10-10 | Discharge: 2020-10-10 | Disposition: A | Discharge status: Home / Self Care | Payer: No Typology Code available for payment source | Source: Ambulatory Visit | Attending: Radiation Oncology | Admitting: Radiation Oncology

## 2020-10-10 DIAGNOSIS — Z170 Estrogen receptor positive status [ER+]: Secondary | ICD-10-CM | POA: Insufficient documentation

## 2020-10-10 DIAGNOSIS — C50411 Malignant neoplasm of upper-outer quadrant of right female breast: Secondary | ICD-10-CM | POA: Insufficient documentation

## 2020-10-11 ENCOUNTER — Other Ambulatory Visit: Payer: Self-pay

## 2020-10-11 ENCOUNTER — Ambulatory Visit
Admission: RE | Admit: 2020-10-11 | Discharge: 2020-10-11 | Disposition: A | Discharge status: Home / Self Care | Payer: No Typology Code available for payment source | Source: Ambulatory Visit | Attending: Radiation Oncology | Admitting: Radiation Oncology

## 2020-10-12 ENCOUNTER — Ambulatory Visit
Admission: RE | Admit: 2020-10-12 | Discharge: 2020-10-12 | Disposition: A | Discharge status: Home / Self Care | Payer: No Typology Code available for payment source | Source: Ambulatory Visit | Attending: Radiation Oncology | Admitting: Radiation Oncology

## 2020-10-15 ENCOUNTER — Ambulatory Visit
Admission: RE | Admit: 2020-10-15 | Discharge: 2020-10-15 | Disposition: A | Discharge status: Home / Self Care | Payer: No Typology Code available for payment source | Source: Ambulatory Visit | Attending: Radiation Oncology | Admitting: Radiation Oncology

## 2020-10-15 ENCOUNTER — Encounter: Payer: Self-pay | Admitting: *Deleted

## 2020-10-15 ENCOUNTER — Other Ambulatory Visit: Payer: Self-pay

## 2020-10-16 ENCOUNTER — Ambulatory Visit
Admission: RE | Admit: 2020-10-16 | Discharge: 2020-10-16 | Disposition: A | Discharge status: Home / Self Care | Payer: No Typology Code available for payment source | Source: Ambulatory Visit | Attending: Radiation Oncology | Admitting: Radiation Oncology

## 2020-10-17 ENCOUNTER — Other Ambulatory Visit: Payer: Self-pay

## 2020-10-17 ENCOUNTER — Encounter: Payer: Self-pay | Admitting: Radiation Oncology

## 2020-10-17 ENCOUNTER — Ambulatory Visit
Admission: RE | Admit: 2020-10-17 | Discharge: 2020-10-17 | Disposition: A | Discharge status: Home / Self Care | Payer: No Typology Code available for payment source | Source: Ambulatory Visit | Attending: Radiation Oncology | Admitting: Radiation Oncology

## 2020-10-30 NOTE — Progress Notes (Signed)
[place]  [phone] [phone]     ID: Makennah Omura DOB: 12-23-1955  MR#: [id number]  YHC#:[id number]  Patient Care Team: Kristie Cowman, MD as PCP - General (Family Medicine) Mauro Kaufmann, RN as Oncology Nurse Navigator Rockwell Germany, RN as Oncology Nurse Navigator Keilan Nichol, Virgie Dad, MD as Consulting Physician (Oncology) Erroll Luna, MD as Consulting Physician (General Surgery) Eppie Gibson, MD as Attending Physician (Radiation Oncology) Chauncey Cruel, MD OTHER MD:  CHIEF COMPLAINT: Estrogen receptor positive breast cancer  CURRENT TREATMENT: Anastrozole; considering abemaciclib   INTERVAL HISTORY: Reaghan returns today for follow up of her estrogen receptor positive breast cancer.   Since her last visit, she received radiation therapy from 09/05/2020 through 10/17/2020 under Dr. Isidore Moos.  She generally did well with this, with some hyperpigmentation and peeling and mild decrease in energy.   REVIEW OF SYSTEMS: Analysse is generally at baseline.  Her hair has come in very strong and she is very pleased with it.  She was doing some exercises and pulled her back a little yesterday.  That is beginning to feel a little bit better.  She has no focal weakness.  She is looking forward to a brief trip to [place] in October.  A detailed review of systems today was otherwise noncontributory   COVID 19 VACCINATION STATUS: s/p  [employer]  X2, most recently June 2021, no booster as of 05/01/2020   HISTORY OF CURRENT ILLNESS: From the original intake note:  Lauretta Sallas herself palpated a mass in the right axilla "a long time ago", more recently with complaints of associated pain. She brought it to medical attention and underwent bilateral diagnostic mammography with tomography and bilateral breast ultrasonography at [facility] on 01/31/2020 showing: breast density category B; three right breast masses-- 1.9 cm at 12 o'clock, 2.3 cm at 11  o'clock, 0.4 cm at 1 o'clock; two adjacent enlarged thickened lymph nodes measuring up to 2.1 cm; no evidence of left breast malignancy.  Accordingly on 02/03/2020 she proceeded to biopsy of the right breast area at 12 o'clock and right axilla. The pathology from this procedure ([id number]) showed: invasive ductal carcinoma, grade 3. Prognostic indicators significant for: estrogen receptor, 100% positive with strong staining intensity and progesterone receptor, 0% negative. Proliferation marker Ki67 at 60%. HER2 equivocal by immunohistochemistry (2+), but negative by fluorescent in situ hybridization with a signals ratio 0.81 and number per cell 2.7.  The biopsied lymph node was found to show metastatic carcinoma. The morphology was considered different from the biopsied mass, and a prognostic panel was performed. Estrogen receptor, 100% positive with strong staining intensity and progesterone receptor, 0% negative. Proliferation marker Ki67 at 70%. HER2 equivcoal by immunohistochemistry (2+), but negative) by fluorescent in situ hybridization with a signals ratio 1.16 and number per cell 3.9.  She underwent additional right breast biopsies on 02/06/2020. Pathology [id number]) from the mass at 11 o'clock showed: invasive ductal carcinoma, grade 2. Prognostic indicators significant for: estrogen receptor, 100% positive with strong staining intensity and progesterone receptor, 0% negative. Proliferation marker Ki67 at 15%. HER2 equivocal by immunohistochemistry (2+), but negative by fluorescent in situ hybridization with a signals ratio 1.29 and number per cell 2.7.  The biopsied mass at 1 o'clock showed only fibrocystic change.  The patient's subsequent history is as detailed below.   PAST MEDICAL HISTORY: Past Medical History:  Diagnosis Date   Arthritis    Cancer ([place])    breast   Diabetes mellitus without complication ([place])  Hypertension     PAST SURGICAL HISTORY: Past Surgical History:   Procedure Laterality Date   BREAST LUMPECTOMY WITH RADIOACTIVE SEED AND SENTINEL LYMPH NODE BIOPSY Right 07/26/2020   Procedure: RIGHT BREAST LUMPECTOMY WITH RADIOACTIVE SEED X 2 AND SENTINEL LYMPH NODE BIOPSY;  Surgeon: Erroll Luna, MD;  Location: [facility];  Service: General;  Laterality: Right;   IR IMAGING GUIDED PORT INSERTION  02/23/2020   PORT-A-CATH REMOVAL N/A 07/26/2020   Procedure: REMOVAL PORT-A-CATH;  Surgeon: Erroll Luna, MD;  Location: [facility];  Service: General;  Laterality: N/A;    FAMILY HISTORY: Family History  Problem Relation Age of Onset   Asthma Mother    Arthritis Sister    Asthma Brother    Arthritis Brother   [name] died in his 42s from causes unknown to the patient.  [name] died from heart disease at age 66.  The patient had 4 brothers, 1 sister, with no history of cancer in the family to her knowledge   GYNECOLOGIC HISTORY:  No LMP recorded. Patient is postmenopausal. Menarche: 65 years old Age at first live birth: 65 years old [place] P 1 LMP 12s Contraceptive HRT no  Hysterectomy? no BSO? no   SOCIAL HISTORY: (updated 02/2020)  Ravina is originally from [place].  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  She describes herself as single.  At home she lives with her daughter [name] who works for [employer], her daughters' husband [name] who is a English as a second language teacher, and their children who are 58 months old and 44 years old.  The patient is [name]    ADVANCED DIRECTIVES: Not in place.  The patient tells me she would intend to name her daughter [name] as her healthcare power of attorney   HEALTH MAINTENANCE: Social History   Tobacco Use   Smoking status: Never   Smokeless tobacco: Never  Vaping Use   Vaping Use: Never used  Substance Use Topics   Alcohol use: Yes    Alcohol/week: 1.0 standard drink    Types: 1 Glasses of wine per week    Comment: occasionally    Drug use: Never     Colonoscopy: never done  PAP: 2017 (performed in [place])  Bone density:    Allergies  Allergen Reactions   Sulfa Antibiotics Itching    Current Outpatient Medications  Medication Sig Dispense Refill   anastrozole (ARIMIDEX) 1 MG tablet Take 1 tablet (1 mg total) by mouth daily. 90 tablet 4   acetaminophen (TYLENOL) 500 MG tablet Take 500 mg by mouth 3 (three) times daily as needed.     amLODipine (NORVASC) 5 MG tablet Take 5 mg by mouth daily.     atorvastatin (LIPITOR) 40 MG tablet Take 40 mg by mouth daily.     carvedilol (COREG) 6.25 MG tablet Take 6.25 mg by mouth 2 (two) times daily with a meal.     ibuprofen (ADVIL) 800 MG tablet Take 1 tablet (800 mg total) by mouth every 8 (eight) hours as needed. 30 tablet 0   lansoprazole (PREVACID) 30 MG capsule Take 30 mg by mouth every morning.     losartan (COZAAR) 100 MG tablet Take 100 mg by mouth daily.     metFORMIN (GLUCOPHAGE) 1000 MG tablet Take 1,000 mg by mouth daily with breakfast.      No current facility-administered medications for this visit.    OBJECTIVE: African-American woman in no acute distress  Vitals:   10/31/20 1504  BP: 127/78  Pulse: 80  Resp: 17  Temp: 97.7 F (36.5 C)  SpO2: 100%     Body mass index is 32.93 kg/m.   Wt Readings from Last 3 Encounters:  10/31/20 204 lb (92.5 kg)  07/26/20 207 lb 3.7 oz (94 kg)  07/18/20 212 lb 12.8 oz (96.5 kg)      ECOG FS:1 - Symptomatic but completely ambulatory  Sclerae unicteric, EOMs intact Wearing a mask No cervical or supraclavicular adenopathy Lungs no rales or rhonchi Heart regular rate and rhythm Abd soft, nontender, positive bowel sounds MSK no focal spinal tenderness, no upper extremity lymphedema Neuro: nonfocal, well oriented, appropriate affect Breasts: The right breast is status postlumpectomy and radiation.  There is some desquamation.  There is significant hyperpigmentation.  There is no evidence of local  recurrence.  The cosmetic result is otherwise good.  Left breast and both axillae are benign.   LAB RESULTS:  CMP     Component Value Date/Time   NA 140 10/31/2020 1438   K 4.4 10/31/2020 1438   CL 106 10/31/2020 1438   CO2 26 10/31/2020 1438   GLUCOSE 97 10/31/2020 1438   BUN 11 10/31/2020 1438   CREATININE 0.69 10/31/2020 1438   CREATININE 0.76 04/02/2020 0900   CALCIUM 9.2 10/31/2020 1438   PROT 6.5 10/31/2020 1438   ALBUMIN 3.7 10/31/2020 1438   AST 23 10/31/2020 1438   AST 10 (L) 04/02/2020 0900   ALT 25 10/31/2020 1438   ALT 14 04/02/2020 0900   ALKPHOS 88 10/31/2020 1438   BILITOT 0.3 10/31/2020 1438   BILITOT 0.6 04/02/2020 0900   GFRNONAA >60 10/31/2020 1438   GFRNONAA >60 04/02/2020 0900    No results found for: TOTALPROTELP, ALBUMINELP, A1GS, A2GS, BETS, BETA2SER, GAMS, MSPIKE, SPEI  Lab Results  Component Value Date   WBC 3.0 (L) 10/31/2020   NEUTROABS 1.6 (L) 10/31/2020   HGB 11.2 (L) 10/31/2020   HCT 33.8 (L) 10/31/2020   MCV 90.6 10/31/2020   PLT 210 10/31/2020    No results found for: LABCA2  No components found for: [id number]  No results for input(s): INR in the last 168 hours.  No results found for: LABCA2  No results found for: TAV697  No results found for: XYI016  No results found for: PVV748  No results found for: CA2729  No components found for: HGQUANT  No results found for: CEA1 / No results found for: CEA1   No results found for: AFPTUMOR  No results found for: CHROMOGRNA  No results found for: KPAFRELGTCHN, LAMBDASER, KAPLAMBRATIO (kappa/lambda light chains)  No results found for: HGBA, HGBA2QUANT, HGBFQUANT, HGBSQUAN (Hemoglobinopathy evaluation)   No results found for: LDH  No results found for: IRON, TIBC, IRONPCTSAT (Iron and TIBC)  No results found for: FERRITIN  Urinalysis No results found for: COLORURINE, APPEARANCEUR, LABSPEC, PHURINE, GLUCOSEU, HGBUR, BILIRUBINUR, KETONESUR, PROTEINUR, UROBILINOGEN,  NITRITE, LEUKOCYTESUR   STUDIES: No results found.   ELIGIBLE FOR AVAILABLE RESEARCH PROTOCOL: AET  ASSESSMENT: 65 y.o. [place] woman status post right breast upper outer quadrant (12:00) and axillary lymph node biopsy 02/03/2020, both positive for a clinical T1c N1, stage IIB invasive ductal carcinoma, grade 3, estrogen receptor positive, HER-2 and progesterone receptor negative, with an MIB-1 of 60%.  (a) biopsy of a second right upper outer quadrant T2 lesion (11 o'clock) 02/06/2020 also showed invasive ductal carcinoma, but grade 2, estrogen receptor positive, progesterone receptor and HER-2 negative, with an MIB-1 of  15%  (b) biopsy of a third right breast lesion (1:00) was benign   (1) neoadjuvant chemotherapy consisting of cyclophosphamide and docetaxel every 21 days x 4 starting 03/05/2020  (a) discontinued after 2 cycles because of peripheral neuropathy  (b) cyclophosphamide/doxorubicin started 04/24/2020, repeated every 21 days x4, completed 06/25/2020  (c) echo 04/17/2020 showed EF of 55-60%  (2) right lumpectomy 07/26/2020 found a residual pT1c pN1 invasive ductal carcinoma, grade 2, with negative margins  (a) repeat prognostic indicators show the tumor to be estrogen receptor positive, progesterone receptor and HER2 negative, with an MIB-1 of 2%.  (b) a total of 6 right axillary lymph nodes removed, 2 positive  (3) adjuvant radiation completed 10/17/2020  (4) aanastrozole to follow  (5) CT scan of the chest 04/23/2020 shows a left brachiocephalicl catheter associated clot  (a) rivaroxaban started 04/24/2020, discontinued 08/22/2020  (b) port removed 07/26/2020   PLAN: Any has completed her local treatment and also completed her chemotherapy.  She is not ready to start adjuvant antiestrogens.  We discussed the possible toxicities side effects and complications of anastrozole in detail.  I went ahead and wrote her the prescription and she will started today.  I am  setting her up for a virtual visit with me in 1 month just to make sure she is tolerating it well.  We also discussed abemaciclib.  This has been shown to decrease the risk of early invasive recurrence when added to an antiestrogen.  We discussed the possible toxicities side effects and complications and I gave her a copy of the patient information from up-to-date to review with her family.  When I speak with her in a month she will tell me if she wants me to go ahead and get her started on Verzenio at that time  She will then see me again in September.  She will have a new baseline mammogram and a bone density before that visit  Total encounter time 30 minutes.Sarajane Jews C. Sabriel Borromeo, MD 10/31/20 3:21 PM Medical Oncology and Hematology [facility] [address], [place] [address] Tel. [phone]    Fax. [phone]   I, Wilburn Mylar, am acting as scribe for Dr. Virgie Dad. Yeriel Mineo.  I, Lurline Del MD, have reviewed the above documentation for accuracy and completeness, and I agree with the above.   *Total Encounter Time as defined by the Centers for Medicare and Medicaid Services includes, in addition to the face-to-face time of a patient visit (documented in the note above) non-face-to-face time: obtaining and reviewing outside history, ordering and reviewing medications, tests or procedures, care coordination (communications with other health care professionals or caregivers) and documentation in the medical record.

## 2020-10-31 ENCOUNTER — Other Ambulatory Visit: Payer: Self-pay

## 2020-10-31 ENCOUNTER — Inpatient Hospital Stay: Payer: No Typology Code available for payment source | Attending: Oncology

## 2020-10-31 ENCOUNTER — Inpatient Hospital Stay (HOSPITAL_BASED_OUTPATIENT_CLINIC_OR_DEPARTMENT_OTHER): Payer: No Typology Code available for payment source | Admitting: Oncology

## 2020-10-31 ENCOUNTER — Telehealth: Payer: Self-pay | Admitting: Oncology

## 2020-10-31 VITALS — BP 127/78 | HR 80 | Temp 97.7°F | Resp 17 | Wt 204.0 lb

## 2020-10-31 DIAGNOSIS — Z170 Estrogen receptor positive status [ER+]: Secondary | ICD-10-CM | POA: Insufficient documentation

## 2020-10-31 DIAGNOSIS — Z79811 Long term (current) use of aromatase inhibitors: Secondary | ICD-10-CM | POA: Insufficient documentation

## 2020-10-31 DIAGNOSIS — Z923 Personal history of irradiation: Secondary | ICD-10-CM | POA: Insufficient documentation

## 2020-10-31 DIAGNOSIS — C50411 Malignant neoplasm of upper-outer quadrant of right female breast: Secondary | ICD-10-CM

## 2020-10-31 LAB — CBC WITH DIFFERENTIAL/PLATELET
Abs Immature Granulocytes: 0 10*3/uL (ref 0.00–0.07)
Basophils Absolute: 0 10*3/uL (ref 0.0–0.1)
Basophils Relative: 1 %
Eosinophils Absolute: 0.1 10*3/uL (ref 0.0–0.5)
Eosinophils Relative: 4 %
HCT: 33.8 % — ABNORMAL LOW (ref 36.0–46.0)
Hemoglobin: 11.2 g/dL — ABNORMAL LOW (ref 12.0–15.0)
Immature Granulocytes: 0 %
Lymphocytes Relative: 32 %
Lymphs Abs: 1 10*3/uL (ref 0.7–4.0)
MCH: 30 pg (ref 26.0–34.0)
MCHC: 33.1 g/dL (ref 30.0–36.0)
MCV: 90.6 fL (ref 80.0–100.0)
Monocytes Absolute: 0.3 10*3/uL (ref 0.1–1.0)
Monocytes Relative: 10 %
Neutro Abs: 1.6 10*3/uL — ABNORMAL LOW (ref 1.7–7.7)
Neutrophils Relative %: 53 %
Platelets: 210 10*3/uL (ref 150–400)
RBC: 3.73 MIL/uL — ABNORMAL LOW (ref 3.87–5.11)
RDW: 14.4 % (ref 11.5–15.5)
WBC: 3 10*3/uL — ABNORMAL LOW (ref 4.0–10.5)
nRBC: 0 % (ref 0.0–0.2)

## 2020-10-31 LAB — COMPREHENSIVE METABOLIC PANEL
ALT: 25 U/L (ref 0–44)
AST: 23 U/L (ref 15–41)
Albumin: 3.7 g/dL (ref 3.5–5.0)
Alkaline Phosphatase: 88 U/L (ref 38–126)
Anion gap: 8 (ref 5–15)
BUN: 11 mg/dL (ref 8–23)
CO2: 26 mmol/L (ref 22–32)
Calcium: 9.2 mg/dL (ref 8.9–10.3)
Chloride: 106 mmol/L (ref 98–111)
Creatinine, Ser: 0.69 mg/dL (ref 0.44–1.00)
GFR, Estimated: >60 mL/min (ref >60)
Glucose, Bld: 97 mg/dL (ref 70–99)
Potassium: 4.4 mmol/L (ref 3.5–5.1)
Sodium: 140 mmol/L (ref 135–145)
Total Bilirubin: 0.3 mg/dL (ref 0.3–1.2)
Total Protein: 6.5 g/dL (ref 6.5–8.1)

## 2020-10-31 MED ORDER — ANASTROZOLE 1 MG PO TABS: 1.0000 mg | ORAL_TABLET | Freq: Every day | ORAL | 4 refills | Status: DC

## 2020-10-31 NOTE — Telephone Encounter (Signed)
Scheduled appointment per 06/22 los. Patient is aware.

## 2020-11-30 ENCOUNTER — Ambulatory Visit: Payer: Self-pay | Admitting: Radiation Oncology

## 2020-12-05 ENCOUNTER — Other Ambulatory Visit: Payer: Self-pay

## 2020-12-05 ENCOUNTER — Encounter: Payer: Self-pay | Admitting: Oncology

## 2020-12-05 DIAGNOSIS — C50411 Malignant neoplasm of upper-outer quadrant of right female breast: Secondary | ICD-10-CM

## 2020-12-05 DIAGNOSIS — Z170 Estrogen receptor positive status [ER+]: Secondary | ICD-10-CM

## 2020-12-05 NOTE — Progress Notes (Signed)
                                                                                                                                                             Patient Name: [name] [name] MRN: [id number] DOB: 1956/01/14 Referring Physician: Kristie Cowman (Profile Not Attached) Date of Service: 10/17/2020 [facility] [place], [place]                                                        End Of Treatment Note  Diagnoses: C50.411-Malignant neoplasm of upper-outer quadrant of right female breast  Cancer Staging: Cancer Staging Malignant neoplasm of upper-outer quadrant of right breast in female, estrogen receptor positive ([place]) Staging form: Breast, AJCC 8th Edition - Clinical stage from 02/06/2020: Stage IIIA (cT2, cN1, cM0, G3, ER+, PR-, HER2-) - Signed by Eppie Gibson, MD on 02/22/2020 Stage prefix: Initial diagnosis Histologic grading system: 3 grade system - Pathologic stage from 07/26/2020: No Stage Recommended (ypT1c, pN1a, cM0) - Signed by Gardenia Phlegm, NP on 08/08/2020 Stage prefix: Post-therapy  Intent: Curative  Radiation Treatment Dates: 09/05/2020 through 10/17/2020 Site Technique Total Dose (Gy) Dose per Fx (Gy) Completed Fx Beam Energies  Breast, Right: Breast_Rt 3D 50/50 2 25/25 6X, 10X  Breast, Right: Breast_Rt_PAB_SCV 3D 50/50 2 25/25 6X, 10X  Breast, Right: Breast_Rt_Bst 3D 10/10 2 5/5 6X   Narrative: The patient tolerated radiation therapy relatively well.   Plan: The patient will follow-up with radiation oncology in 62mo or PRN . -----------------------------------  Eppie Gibson, MD

## 2020-12-05 NOTE — Addendum Note (Signed)
Addended by: Demetrius Revel on: 12/05/2020 10:15 AM   Modules accepted: Orders

## 2020-12-07 ENCOUNTER — Ambulatory Visit: Payer: No Typology Code available for payment source | Admitting: Radiation Oncology

## 2020-12-07 NOTE — Progress Notes (Addendum)
..  The following Assist/Replace Program for Udenyca from [place] has been terminated due to treatment was completed on 06/27/2020, no longer receiving active treatment.  Last DOS: 06/27/2020. Marland KitchenJuan Quam, CPhT IV Drug Replacement Specialist [facility] Phone: [phone]

## 2020-12-18 ENCOUNTER — Inpatient Hospital Stay: Payer: No Typology Code available for payment source | Admitting: Oncology

## 2020-12-18 ENCOUNTER — Other Ambulatory Visit: Payer: Self-pay | Admitting: Oncology

## 2020-12-21 ENCOUNTER — Telehealth: Payer: Self-pay | Admitting: *Deleted

## 2020-12-21 NOTE — Telephone Encounter (Signed)
This RN attempted to contact pt per cancelled appt ( per appt medications were to be discussed and ordered).  Obtained verified VM - message left to call this RN back per above concern.

## 2021-01-04 ENCOUNTER — Other Ambulatory Visit: Payer: Self-pay

## 2021-01-04 ENCOUNTER — Encounter: Payer: Self-pay | Admitting: Radiation Oncology

## 2021-01-04 ENCOUNTER — Ambulatory Visit
Admission: RE | Admit: 2021-01-04 | Discharge: 2021-01-04 | Disposition: A | Discharge status: Home / Self Care | Payer: No Typology Code available for payment source | Source: Ambulatory Visit | Attending: Radiation Oncology | Admitting: Radiation Oncology

## 2021-01-04 VITALS — BP 121/77 | HR 74 | Temp 99.4°F | Resp 18 | Ht 66.0 in | Wt 200.2 lb

## 2021-01-04 DIAGNOSIS — I890 Lymphedema, not elsewhere classified: Secondary | ICD-10-CM | POA: Insufficient documentation

## 2021-01-04 DIAGNOSIS — Z170 Estrogen receptor positive status [ER+]: Secondary | ICD-10-CM | POA: Insufficient documentation

## 2021-01-04 DIAGNOSIS — C50411 Malignant neoplasm of upper-outer quadrant of right female breast: Secondary | ICD-10-CM | POA: Insufficient documentation

## 2021-01-04 NOTE — Progress Notes (Signed)
Ms. Stidam presents today for follow-up after completing radiation to her right breast and right subclavian on 10/17/2020  Pain: Reports occasional tenderness/stinging pains to right breast, but states they both resolve on their own Skin: Reports skin is intact and well healed ROM: Continues to deal with shoulder stiffness/slight limitation in range of motion Lymphedema: Denies any swelling to arm (reports she doesn't feel the need to wear her compression sleeve from PT), but does report lingering swelling to her right breast MedOnc F/U: Last saw Dr. Jana Hakim on 10/31/20 and per his note is scheuled to see him again in September to discuss whether or not she would like to add abermaciclib in conjunction with her antiestrogen regimen  Other issues of note: Dealing with on-going seasonal allergies, but otherwise denies any new issues/concerns  Pt reports Yes No Comments  Tamoxifen '[]'$  '[x]'$    Letrozole '[]'$  '[x]'$    Anastrazole '[x]'$  '[]'$  '1mg'$  daily  Mammogram '[x]'$  Date: 02/07/21 '[]'$ 

## 2021-01-04 NOTE — Progress Notes (Signed)
Radiation Oncology         [phone] ________________________________  Name: [name] [name] MRN: [id number]  Date: 01/04/2021  DOB: Mar 30, 1956  Follow-Up Visit Note  Outpatient  CC: Kristie Cowman, MD  Kristie Cowman, MD  Diagnosis and Prior Radiotherapy:    ICD-10-CM   1. Malignant neoplasm of upper-outer quadrant of right breast in female, estrogen receptor positive ([place])  C50.411    Z17.0     2. Lymphedema of breast  I89.0 Ambulatory referral to Physical Therapy     Cancer Staging Malignant neoplasm of upper-outer quadrant of right breast in female, estrogen receptor positive ([place]) Staging form: Breast, AJCC 8th Edition - Clinical stage from 02/06/2020: Stage IIIA (cT2, cN1, cM0, G3, ER+, PR-, HER2-) - Signed by Eppie Gibson, MD on 02/22/2020 Stage prefix: Initial diagnosis Histologic grading system: 3 grade system - Pathologic stage from 07/26/2020: No Stage Recommended (ypT1c, pN1a, cM0) - Signed by Gardenia Phlegm, NP on 08/08/2020 Stage prefix: Post-therapy  CHIEF COMPLAINT: Here for follow-up and surveillance of breast cancer  Narrative:  The patient returns today for routine follow-up.  She is doing well but does note discomfort and swelling in her right breast.  She has not seen physical therapy recently and states concerns about the affordability of this if referred.                              ALLERGIES:  is allergic to sulfa antibiotics.  Meds: Current Outpatient Medications  Medication Sig Dispense Refill   acetaminophen (TYLENOL) 500 MG tablet Take 500 mg by mouth 3 (three) times daily as needed.     amLODipine (NORVASC) 5 MG tablet Take 5 mg by mouth daily.     anastrozole (ARIMIDEX) 1 MG tablet Take 1 tablet (1 mg total) by mouth daily. 90 tablet 4   atorvastatin (LIPITOR) 40 MG tablet Take 40 mg by mouth daily.     carvedilol (COREG) 6.25 MG tablet Take 6.25 mg by mouth 2 (two) times daily with a meal.     ibuprofen (ADVIL) 800 MG tablet Take 1  tablet (800 mg total) by mouth every 8 (eight) hours as needed. 30 tablet 0   lansoprazole (PREVACID) 30 MG capsule Take 30 mg by mouth every morning.     losartan (COZAAR) 100 MG tablet Take 100 mg by mouth daily.     metFORMIN (GLUCOPHAGE) 1000 MG tablet Take 1,000 mg by mouth daily with breakfast.      No current facility-administered medications for this encounter.    Physical Findings: The patient is in no acute distress. Patient is alert and oriented.  height is _0  (1.676 m) and weight is 200 lb 4 oz (90.8 kg). Her temperature is 99.4 F (37.4 C). Her blood pressure is 121/77 and her pulse is 74. Her respiration is 18 and oxygen saturation is 100%. .    Satisfactory skin healing in radiotherapy fields.  There is some residual hyperpigmentation over her breast.  The skin is smooth without any residual dryness.  The right breast has significant diffuse swelling and a palpable seroma   Lab Findings: Lab Results  Component Value Date   WBC 3.0 (L) 10/31/2020   HGB 11.2 (L) 10/31/2020   HCT 33.8 (L) 10/31/2020   MCV 90.6 10/31/2020   PLT 210 10/31/2020    Radiographic Findings: No results found.  Impression/Plan: Healing well from radiotherapy to the breast tissue.  She does, however,  have swelling of the breast consistent with lymphedema and a palpable seroma.  I will refer her to physical therapy.  She has concerns about the affordability of this and we will ask for someone from physical therapy to call her with an estimate of the cost.  I encouraged her to continue with yearly mammography and followup with medical oncology. I will see her back on an as-needed basis. I have encouraged her to call if she has any issues or concerns in the future. I wished her the very best.  On date of service, in total, I spent 15 minutes on this encounter. Patient was seen in person.  _____________________________________   Eppie Gibson, MD

## 2021-01-23 ENCOUNTER — Inpatient Hospital Stay: Payer: Self-pay | Attending: Oncology | Admitting: Oncology

## 2021-01-23 ENCOUNTER — Other Ambulatory Visit: Payer: Self-pay

## 2021-01-23 DIAGNOSIS — Z7984 Long term (current) use of oral hypoglycemic drugs: Secondary | ICD-10-CM | POA: Insufficient documentation

## 2021-01-23 DIAGNOSIS — C50411 Malignant neoplasm of upper-outer quadrant of right female breast: Secondary | ICD-10-CM | POA: Insufficient documentation

## 2021-01-23 DIAGNOSIS — Z79811 Long term (current) use of aromatase inhibitors: Secondary | ICD-10-CM | POA: Insufficient documentation

## 2021-01-23 DIAGNOSIS — Z923 Personal history of irradiation: Secondary | ICD-10-CM | POA: Insufficient documentation

## 2021-01-23 DIAGNOSIS — Z170 Estrogen receptor positive status [ER+]: Secondary | ICD-10-CM | POA: Insufficient documentation

## 2021-01-23 DIAGNOSIS — Z79899 Other long term (current) drug therapy: Secondary | ICD-10-CM | POA: Insufficient documentation

## 2021-01-23 DIAGNOSIS — I10 Essential (primary) hypertension: Secondary | ICD-10-CM | POA: Insufficient documentation

## 2021-01-23 NOTE — Progress Notes (Signed)
[facility]  [phone] [phone]     ID: [name] [name] DOB: 18-Jul-1955  MR#: [id number]  YZJ#:[id number]  Patient Care Team: Kristie Cowman, MD as PCP - General (Family Medicine) Mauro Kaufmann, RN as Oncology Nurse Navigator Rockwell Germany, RN as Oncology Nurse Navigator Finnick Orosz, Virgie Dad, MD as Consulting Physician (Oncology) Erroll Luna, MD as Consulting Physician (General Surgery) Eppie Gibson, MD as Attending Physician (Radiation Oncology) Chauncey Cruel, MD OTHER MD:  CHIEF COMPLAINT: Estrogen receptor positive breast cancer  CURRENT TREATMENT: Anastrozole; considering abemaciclib   INTERVAL HISTORY: [name] [name] returns today for follow up of her estrogen receptor positive breast cancer.   I prescribed anastrozole at her last visit on 10/31/2020.  She is experiencing some hot flashes but no significant problems with vaginal dryness.  She does not feel she needs any intervention for the hot flashes and does not anticipate any barrier to completing 5 years on this medication   REVIEW OF SYSTEMS: [name] [name]'s hair has come back full with no bald spots.  She exercises chiefly by taking short walks and doing housework.  She has pain in both her hands and knees, which is not new.  She has rare headaches.  A detailed review of systems was otherwise stable.   COVID 19 VACCINATION STATUS: s/p  [employer]  X2, most recently June 2021, no booster as of 05/01/2020   HISTORY OF CURRENT ILLNESS: From the original intake note:  [name] [name] herself palpated a mass in the right axilla "a long time ago", more recently with complaints of associated pain. She brought it to medical attention and underwent bilateral diagnostic mammography with tomography and bilateral breast ultrasonography at [facility] on 01/31/2020 showing: breast density category B; three right breast masses-- 1.9 cm at 12 o'clock, 2.3 cm at 11 o'clock, 0.4 cm at 1 o'clock; two  adjacent enlarged thickened lymph nodes measuring up to 2.1 cm; no evidence of left breast malignancy.  Accordingly on 02/03/2020 she proceeded to biopsy of the right breast area at 12 o'clock and right axilla. The pathology from this procedure ([id number]) showed: invasive ductal carcinoma, grade 3. Prognostic indicators significant for: estrogen receptor, 100% positive with strong staining intensity and progesterone receptor, 0% negative. Proliferation marker Ki67 at 60%. HER2 equivocal by immunohistochemistry (2+), but negative by fluorescent in situ hybridization with a signals ratio 0.81 and number per cell 2.7.  The biopsied lymph node was found to show metastatic carcinoma. The morphology was considered different from the biopsied mass, and a prognostic panel was performed. Estrogen receptor, 100% positive with strong staining intensity and progesterone receptor, 0% negative. Proliferation marker Ki67 at 70%. HER2 equivcoal by immunohistochemistry (2+), but negative) by fluorescent in situ hybridization with a signals ratio 1.16 and number per cell 3.9.  She underwent additional right breast biopsies on 02/06/2020. Pathology [id number]) from the mass at 11 o'clock showed: invasive ductal carcinoma, grade 2. Prognostic indicators significant for: estrogen receptor, 100% positive with strong staining intensity and progesterone receptor, 0% negative. Proliferation marker Ki67 at 15%. HER2 equivocal by immunohistochemistry (2+), but negative by fluorescent in situ hybridization with a signals ratio 1.29 and number per cell 2.7.  The biopsied mass at 1 o'clock showed only fibrocystic change.  The patient's subsequent history is as detailed below.   PAST MEDICAL HISTORY: Past Medical History:  Diagnosis Date   Arthritis    Cancer ([place])    breast   Diabetes mellitus without complication ([place])    Hypertension  PAST SURGICAL HISTORY: Past Surgical History:  Procedure Laterality Date    BREAST LUMPECTOMY WITH RADIOACTIVE SEED AND SENTINEL LYMPH NODE BIOPSY Right 07/26/2020   Procedure: RIGHT BREAST LUMPECTOMY WITH RADIOACTIVE SEED X 2 AND SENTINEL LYMPH NODE BIOPSY;  Surgeon: Erroll Luna, MD;  Location: [facility];  Service: General;  Laterality: Right;   IR IMAGING GUIDED PORT INSERTION  02/23/2020   PORT-A-CATH REMOVAL N/A 07/26/2020   Procedure: REMOVAL PORT-A-CATH;  Surgeon: Erroll Luna, MD;  Location: [facility];  Service: General;  Laterality: N/A;    FAMILY HISTORY: Family History  Problem Relation Age of Onset   Asthma Mother    Arthritis Sister    Asthma Brother    Arthritis Brother   [name] died in his 58s from causes unknown to the patient.  [name] died from heart disease at age 44.  The patient had 4 brothers, 1 sister, with no history of cancer in the family to her knowledge   GYNECOLOGIC HISTORY:  No LMP recorded. Patient is postmenopausal. Menarche: 65 years old Age at first live birth: 65 years old [place] P 1 LMP 57s Contraceptive HRT no  Hysterectomy? no BSO? no   SOCIAL HISTORY: (updated 02/2020)  [name] [name] is originally from [place].  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  She describes herself as single.  At home she lives with her daughter [name] [name] who works for [employer], her daughters' husband [name] [name] who is a English as a second language teacher, and their children who are 44 months old and 67 years old.  The patient is [name]    ADVANCED DIRECTIVES: Not in place.  The patient tells me she would intend to name her daughter [name] [name] as her healthcare power of attorney   HEALTH MAINTENANCE: Social History   Tobacco Use   Smoking status: Never   Smokeless tobacco: Never  Vaping Use   Vaping Use: Never used  Substance Use Topics   Alcohol use: Yes    Alcohol/week: 1.0 standard drink    Types: 1 Glasses of wine per week    Comment: occasionally   Drug use: Never      Colonoscopy: never done  PAP: 2017 (performed in [place])  Bone density:    Allergies  Allergen Reactions   Sulfa Antibiotics Itching    Current Outpatient Medications  Medication Sig Dispense Refill   acetaminophen (TYLENOL) 500 MG tablet Take 500 mg by mouth 3 (three) times daily as needed. (Patient not taking: Reported on 01/24/2021)     amLODipine (NORVASC) 5 MG tablet Take 5 mg by mouth daily. (Patient not taking: Reported on 01/24/2021)     anastrozole (ARIMIDEX) 1 MG tablet Take 1 tablet (1 mg total) by mouth daily. 90 tablet 4   atorvastatin (LIPITOR) 40 MG tablet Take 40 mg by mouth daily.     carvedilol (COREG) 6.25 MG tablet Take 6.25 mg by mouth 2 (two) times daily with a meal.     ibuprofen (ADVIL) 800 MG tablet Take 1 tablet (800 mg total) by mouth every 8 (eight) hours as needed. 30 tablet 0   lansoprazole (PREVACID) 30 MG capsule Take 30 mg by mouth every morning.     losartan (COZAAR) 100 MG tablet Take 100 mg by mouth daily.     metFORMIN (GLUCOPHAGE) 1000 MG tablet Take 1,000 mg by mouth daily with breakfast.      No current facility-administered medications for this visit.    OBJECTIVE:  African-American woman in no acute distress  There were no vitals filed for this visit.    There is no height or weight on file to calculate BMI.   Wt Readings from Last 3 Encounters:  01/04/21 200 lb 4 oz (90.8 kg)  10/31/20 204 lb (92.5 kg)  07/26/20 207 lb 3.7 oz (94 kg)     ECOG FS:1 - Symptomatic but completely ambulatory  Sclerae unicteric, EOMs intact Wearing a mask No cervical or supraclavicular adenopathy Lungs no rales or rhonchi Heart regular rate and rhythm Abd soft, nontender, positive bowel sounds MSK no focal spinal tenderness, no upper extremity lymphedema Neuro: nonfocal, well oriented, appropriate affect Breasts: The right breast has undergone lumpectomy and radiation.  There is residual hyperpigmentation but no desquamation and the cosmetic  result overall is good.  There is no evidence of local recurrence.  Left breast and both axillae are benign.   LAB RESULTS:  CMP     Component Value Date/Time   NA 140 10/31/2020 1438   K 4.4 10/31/2020 1438   CL 106 10/31/2020 1438   CO2 26 10/31/2020 1438   GLUCOSE 97 10/31/2020 1438   BUN 11 10/31/2020 1438   CREATININE 0.69 10/31/2020 1438   CREATININE 0.76 04/02/2020 0900   CALCIUM 9.2 10/31/2020 1438   PROT 6.5 10/31/2020 1438   ALBUMIN 3.7 10/31/2020 1438   AST 23 10/31/2020 1438   AST 10 (L) 04/02/2020 0900   ALT 25 10/31/2020 1438   ALT 14 04/02/2020 0900   ALKPHOS 88 10/31/2020 1438   BILITOT 0.3 10/31/2020 1438   BILITOT 0.6 04/02/2020 0900   GFRNONAA >60 10/31/2020 1438   GFRNONAA >60 04/02/2020 0900    No results found for: TOTALPROTELP, ALBUMINELP, A1GS, A2GS, BETS, BETA2SER, GAMS, MSPIKE, SPEI  Lab Results  Component Value Date   WBC 3.0 (L) 10/31/2020   NEUTROABS 1.6 (L) 10/31/2020   HGB 11.2 (L) 10/31/2020   HCT 33.8 (L) 10/31/2020   MCV 90.6 10/31/2020   PLT 210 10/31/2020    No results found for: LABCA2  No components found for: [id number]  No results for input(s): INR in the last 168 hours.  No results found for: LABCA2  No results found for: YIF027  No results found for: XAJ287  No results found for: OMV672  No results found for: CA2729  No components found for: HGQUANT  No results found for: CEA1 / No results found for: CEA1   No results found for: AFPTUMOR  No results found for: CHROMOGRNA  No results found for: KPAFRELGTCHN, LAMBDASER, KAPLAMBRATIO (kappa/lambda light chains)  No results found for: HGBA, HGBA2QUANT, HGBFQUANT, HGBSQUAN (Hemoglobinopathy evaluation)   No results found for: LDH  No results found for: IRON, TIBC, IRONPCTSAT (Iron and TIBC)  No results found for: FERRITIN  Urinalysis No results found for: COLORURINE, APPEARANCEUR, LABSPEC, PHURINE, GLUCOSEU, HGBUR, BILIRUBINUR, KETONESUR, PROTEINUR,  UROBILINOGEN, NITRITE, LEUKOCYTESUR   STUDIES: No results found.   ELIGIBLE FOR AVAILABLE RESEARCH PROTOCOL: AET  ASSESSMENT: 65 y.o. [name] [name] woman status post right breast upper outer quadrant (12:00) and axillary lymph node biopsy 02/03/2020, both positive for a clinical T1c N1, stage IIB invasive ductal carcinoma, grade 3, estrogen receptor positive, HER-2 and progesterone receptor negative, with an MIB-1 of 60%.  (a) biopsy of a second right upper outer quadrant T2 lesion (11 o'clock) 02/06/2020 also showed invasive ductal carcinoma, but grade 2, estrogen receptor positive, progesterone receptor and HER-2 negative, with an MIB-1 of 15%  (b) biopsy of a third  right breast lesion (1:00) was benign   (1) neoadjuvant chemotherapy consisting of cyclophosphamide and docetaxel every 21 days x 4 starting 03/05/2020  (a) discontinued after 2 cycles because of peripheral neuropathy  (b) cyclophosphamide/doxorubicin started 04/24/2020, repeated every 21 days x4, completed 06/25/2020  (c) echo 04/17/2020 showed EF of 55-60%  (2) right lumpectomy 07/26/2020 found a residual pT1c pN1 invasive ductal carcinoma, grade 2, with negative margins  (a) repeat prognostic indicators show the tumor to be estrogen receptor positive, progesterone receptor and HER2 negative, with an MIB-1 of 2%.  (b) a total of 6 right axillary lymph nodes removed, 2 positive  (3) adjuvant radiation completed 10/17/2020  (4) anastrozole started June 2022  (5) CT scan of the chest 04/23/2020 shows a left brachiocephalicl catheter associated clot  (a) rivaroxaban started 04/24/2020, discontinued 08/22/2020  (b) port removed 07/26/2020   PLAN: Yee is now half a year out from definitive surgery for her breast cancer with no evidence of disease recurrence.  She is tolerating anastrozole generally well.  She does not think she needs any intervention for hot flashes.  Vaginal dryness is not an issue.  She is having some  symptoms which I do not think are related to her anastrozole, pain in her knees for example and some pain in both hands more suggestive of early carpal tunnel problems.  I think she will benefit from newer and better bras and I wrote her a prescription for that.  Given the residual node positive disease abemaciclib is a consideration.  We initiated discussion of that today.  However she is planning to travel to [place] and this would not be a good time to start a medication that can have multiple side effects.  She will return to see [place] in March.  We can renew the discussion regarding abemaciclib at that time  Total encounter time 20 minutes.  Virgie Dad. Bright Spielmann, MD 01/24/21 8:35 PM Medical Oncology and Hematology [facility] [place], [place] [address] Tel. [phone]    Fax. [phone]   I, Wilburn Mylar, am acting as scribe for Dr. Virgie Dad. Gaynor Ferreras.  I, Lurline Del MD, have reviewed the above documentation for accuracy and completeness, and I agree with the above.   *Total Encounter Time as defined by the Centers for Medicare and Medicaid Services includes, in addition to the face-to-face time of a patient visit (documented in the note above) non-face-to-face time: obtaining and reviewing outside history, ordering and reviewing medications, tests or procedures, care coordination (communications with other health care professionals or caregivers) and documentation in the medical record.

## 2021-01-24 ENCOUNTER — Encounter: Payer: Self-pay | Admitting: Oncology

## 2021-01-24 ENCOUNTER — Ambulatory Visit: Payer: No Typology Code available for payment source | Attending: Radiation Oncology

## 2021-01-24 DIAGNOSIS — C50911 Malignant neoplasm of unspecified site of right female breast: Secondary | ICD-10-CM | POA: Insufficient documentation

## 2021-01-24 DIAGNOSIS — R293 Abnormal posture: Secondary | ICD-10-CM | POA: Insufficient documentation

## 2021-01-24 DIAGNOSIS — I890 Lymphedema, not elsewhere classified: Secondary | ICD-10-CM | POA: Insufficient documentation

## 2021-01-24 DIAGNOSIS — M25611 Stiffness of right shoulder, not elsewhere classified: Secondary | ICD-10-CM | POA: Insufficient documentation

## 2021-01-24 DIAGNOSIS — N63 Unspecified lump in unspecified breast: Secondary | ICD-10-CM | POA: Insufficient documentation

## 2021-01-24 DIAGNOSIS — Z483 Aftercare following surgery for neoplasm: Secondary | ICD-10-CM | POA: Insufficient documentation

## 2021-01-24 NOTE — Therapy (Signed)
[place], [place], [address] Phone: [phone]   Fax:  [phone]  Physical Therapy Evaluation  Patient Details  Name: [name] [name] MRN: [id number] Date of Birth: 10-26-1955 Referring Provider (PT): Dr, Isidore Moos   Encounter Date: 01/24/2021   PT End of Session - 01/24/21 1957     Visit Number 1    Number of Visits 9    Date for PT Re-Evaluation 02/14/21    PT Start Time 1600    PT Stop Time 1650    PT Time Calculation (min) 50 min    Activity Tolerance Patient tolerated treatment well    Behavior During Therapy [facility] for tasks assessed/performed             Past Medical History:  Diagnosis Date   Arthritis    Cancer ([place])    breast   Diabetes mellitus without complication ([place])    Hypertension     Past Surgical History:  Procedure Laterality Date   BREAST LUMPECTOMY WITH RADIOACTIVE SEED AND SENTINEL LYMPH NODE BIOPSY Right 07/26/2020   Procedure: RIGHT BREAST LUMPECTOMY WITH RADIOACTIVE SEED X 2 AND SENTINEL LYMPH NODE BIOPSY;  Surgeon: Erroll Luna, MD;  Location: [facility];  Service: General;  Laterality: Right;   IR IMAGING GUIDED PORT INSERTION  02/23/2020   PORT-A-CATH REMOVAL N/A 07/26/2020   Procedure: REMOVAL PORT-A-CATH;  Surgeon: Erroll Luna, MD;  Location: [facility];  Service: General;  Laterality: N/A;    There were no vitals filed for this visit.    Subjective Assessment - 01/24/21 1606     Subjective Pt had Right breast lumpectomy in March 2022 and Radiation which ended June 8,2022. She developed Right breast swelling  several weeks ago.  Her Right breast is tender to touch. She has a referral to get a compression bra but they haven't got an appt yet. They will call today after they leave. Pt reports it feels tight under her right axilla.    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 she ambulates wih a SPC    Patient Stated Goals Decrease right breast swelling    Currently in Pain? Yes    Pain Score 2     Pain Location Finger (Comment which one) thumb   Pain Orientation Left;Right    Pain Descriptors / Indicators Shooting;Sore    Pain Type Acute pain    Pain Onset In the past 7 days    Pain Frequency Intermittent    Aggravating Factors  not sure    Pain Relieving Factors not sure    Multiple Pain Sites No                [facility] PT Assessment - 01/24/21 0001       Assessment   Medical Diagnosis R breast cancer    Referring Provider (PT) Dr, Isidore Moos    Hand Dominance Left      Precautions   Precaution Comments lymphedema risk      Restrictions   Weight Bearing Restrictions No    Other Position/Activity Restrictions uses  SPC cane      Balance Screen   Has the patient fallen in the past 6 months No    Has  the patient had a decrease in activity level because of a fear of falling?  Yes    Is the patient reluctant to leave their home because of a fear of falling?  No      Home Environment   Living Environment Private residence    Living Arrangements Children    Available Help at Discharge Family    Type of [place]      Prior Function   Level of [place] Retired    Leisure baby sit, read,      Cognition   Overall Cognitive Status Within Functional Limits for tasks assessed      Posture/Postural Control   Posture/Postural Control Postural limitations    Postural Limitations Rounded Shoulders;Forward head      AROM   AROM Assessment Site Shoulder    Right/Left Shoulder Right;Left    Right Shoulder Flexion 162 Degrees    Right Shoulder ABduction 165 Degrees    Right Shoulder External Rotation 90 Degrees     Left Shoulder Flexion 160 Degrees    Left Shoulder ABduction 160 Degrees    Left Shoulder External Rotation 95 Degrees               LYMPHEDEMA/ONCOLOGY QUESTIONNAIRE - 01/24/21 0001       Type   Cancer Type right breast cancer      Treatment   Active Chemotherapy Treatment No    Past Chemotherapy Treatment Yes    Active Radiation Treatment No    Past Radiation Treatment Yes    Current Hormone Treatment Yes    Drug Name anastrazole    Past Hormone Therapy No      What other symptoms do you have   Are you Having Heaviness or Tightness Yes    Are you having Pain --   soreness right breast     Right Upper Extremity Lymphedema   15 cm Proximal to Olecranon Process 30 cm    Olecranon Process 26.6 cm    15 cm Proximal to Ulnar Styloid Process 24.3 cm    Just Proximal to Ulnar Styloid Process 17.6 cm    Across Hand at [employer] 20.7 cm    At [place] of 2nd Digit 6.7 cm      Left Upper Extremity Lymphedema   15 cm Proximal to Olecranon Process 32.8 cm    Olecranon Process 27.8 cm    15 cm Proximal to Ulnar Styloid Process 24.2 cm    Just Proximal to Ulnar Styloid Process 17.1 cm    Across Hand at [employer] 22.4 cm    At [place] of 2nd Digit 7.3 cm             L-DEX FLOWSHEETS - 01/24/21 1600       L-DEX LYMPHEDEMA SCREENING   Measurement Type Unilateral    L-DEX MEASUREMENT EXTREMITY Upper Extremity    POSITION  Standing    DOMINANT SIDE Left    At Risk Side Right    BASELINE SCORE (UNILATERAL) 0    L-DEX SCORE (UNILATERAL) -1.1    VALUE CHANGE (UNILAT) -1.1                    Objective measurements completed on examination: See above findings.           PT Long Term Goals - 01/24/21 2015       PT LONG TERM GOAL #1   Title Pt will be independent  in self Right breast MLD    Time 3    Period Weeks    Status New    Target Date 02/14/21      PT LONG TERM GOAL #2   Title Pt will report decreased breast swelling/heaviness by 30-  50%    Time 3    Period Weeks    Status New    Target Date 02/14/21      PT LONG TERM GOAL #3   Title Pt will be knowledgeable of lymphedema risk reduction and how to use prophylactic sleeve for flying    Time 3    Status New    Target Date 02/14/21      PT LONG TERM GOAL #4   Title pt will have a compression bra to help decrease edema in right breast    Time 3    Period Weeks    Status New    Target Date 02/14/21                    Plan - 01/24/21 1959     Clinical Impression Statement Pt referred to PT for significant Right breast swelling that began about 2 weeks ago. Pt noted to have hyperpigmentation, p'eau d'orange, and fibrosis noted especially proximal to the areola. There is some mild pitting edema.  Breast is tender to touch.  See photos in Media.Shoulder ROM is WFL bilaterally and there is no significant difference in circumference of her arms. Pt will be leaving town on October 6 for 6 weeks in the [place] Factors and Comorbidities Comorbidity 2;Comorbidity 3+    Comorbidities diabetes, bilateral knee arthritis, chemotherapy, Radiation    Examination-Activity Limitations Reach Overhead;Carry;Bathing;Dressing    Stability/Clinical Decision Making Stable/Uncomplicated    Clinical Decision Making Low    Rehab Potential Good    PT Frequency 3x / week    PT Duration 3 weeks    PT Treatment/Interventions ADLs/Self Care Home Management;Manual techniques;Manual lymph drainage;Taping;Vasopneumatic Device;Therapeutic exercise    PT Next Visit Plan did pt get compression bra, initiate MLD and start instructing pt, foam in bra prn    Recommended Other Services compression bra, Has a sleeve    Consulted and Agree with Plan of Care Patient;Family member/caregiver             Patient will benefit from skilled therapeutic intervention in order to improve the following deficits and impairments:  Postural dysfunction, Pain, Decreased knowledge of  precautions, Increased edema  Visit Diagnosis: Abnormal posture  Aftercare following surgery for neoplasm  Secondary lymphedema  Breast swelling     Problem List Patient Active Problem List   Diagnosis Date Noted   Aortic atherosclerosis ([place]) 04/24/2020   Deep venous thrombosis of left upper extremity ([place]) 04/24/2020   Port-A-Cath in place 03/26/2020   Malignant neoplasm of upper-outer quadrant of right breast in female, estrogen receptor positive ([place]) 02/13/2020    Claris Pong, PT 01/24/2021, 8:22 PM  [facility], [place], [address] Phone: [phone]   Fax:  [phone]  Name: Johniece Fluke MRN: [id number] Date of Birth: 01/11/1956

## 2021-01-29 ENCOUNTER — Ambulatory Visit: Payer: No Typology Code available for payment source

## 2021-01-29 ENCOUNTER — Other Ambulatory Visit: Payer: Self-pay

## 2021-01-29 DIAGNOSIS — N63 Unspecified lump in unspecified breast: Secondary | ICD-10-CM

## 2021-01-29 DIAGNOSIS — Z483 Aftercare following surgery for neoplasm: Secondary | ICD-10-CM

## 2021-01-29 DIAGNOSIS — C50911 Malignant neoplasm of unspecified site of right female breast: Secondary | ICD-10-CM

## 2021-01-29 DIAGNOSIS — M25611 Stiffness of right shoulder, not elsewhere classified: Secondary | ICD-10-CM

## 2021-01-29 DIAGNOSIS — R293 Abnormal posture: Secondary | ICD-10-CM

## 2021-01-29 DIAGNOSIS — I890 Lymphedema, not elsewhere classified: Secondary | ICD-10-CM

## 2021-01-29 NOTE — Patient Instructions (Signed)
Pt was instructed in Right breast MLD and practiced each aspect with VC's and tactile cues for sequence and stretch/pressure. Pt given illustrated and written handout

## 2021-01-29 NOTE — Therapy (Signed)
[place], [place], [address] Phone: [phone]   Fax:  [phone]  Physical Therapy Treatment  Patient Details  Name: [name] [name] MRN: [id number] Date of Birth: [birth year] Referring Provider (PT): Dr, Isidore Moos   Encounter Date: 01/29/2021   PT End of Session - 01/29/21 1458     Visit Number 2    Number of Visits 9    Date for PT Re-Evaluation 02/14/21    PT Start Time 1406    PT Stop Time 1453    PT Time Calculation (min) 47 min    Activity Tolerance Patient tolerated treatment well    Behavior During Therapy [facility] for tasks assessed/performed             Past Medical History:  Diagnosis Date   Arthritis    Cancer ([place])    breast   Diabetes mellitus without complication ([place])    Hypertension     Past Surgical History:  Procedure Laterality Date   BREAST LUMPECTOMY WITH RADIOACTIVE SEED AND SENTINEL LYMPH NODE BIOPSY Right 07/26/2020   Procedure: RIGHT BREAST LUMPECTOMY WITH RADIOACTIVE SEED X 2 AND SENTINEL LYMPH NODE BIOPSY;  Surgeon: Erroll Luna, MD;  Location: [place];  Service: General;  Laterality: Right;   IR IMAGING GUIDED PORT INSERTION  02/23/2020   PORT-A-CATH REMOVAL N/A 07/26/2020   Procedure: REMOVAL PORT-A-CATH;  Surgeon: Erroll Luna, MD;  Location: [place];  Service: General;  Laterality: N/A;    There were no vitals filed for this visit.   Subjective Assessment - 01/29/21 1408     Subjective She ordered the bra and she will get it on Thursday.  In the mornings  when I wake up it feels swollen.  It is better with a bra.    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 she ambulates wih a SPC     Patient Stated Goals Decrease right breast swelling    Currently in Pain? No/denies    Pain Score 0-No pain                               [facility] Adult PT Treatment/Exercise - 01/29/21 0001       Manual Therapy   Manual Lymphatic Drainage (MLD) therapist performed and instructed pt and then pt. performed, short neck, 5 diaphragmatic breaths, activated left axillary and right inguinal LN's, Anterior interaxillary anastomois, right axillo-inguinal pathway and right breast directing medial side of breast to anterior interaxillary pathway, and lateral breast to axillo inguinal pathway and ending with left axillary and right inguinal LN's.                     PT Education - 01/29/21 1500     Education Details Right breast MLD    Person(s) Educated Patient    Methods Explanation;Demonstration;Tactile cues;Verbal cues;Handout    Comprehension Returned demonstration;Verbal cues required;Tactile cues required;Need further instruction                 PT Long Term Goals - 01/24/21 2015       PT LONG TERM GOAL #1   Title Pt will be independent in self Right breast MLD  Time 3    Period Weeks    Status New    Target Date 02/14/21      PT LONG TERM GOAL #2   Title Pt will report decreased breast swelling/heaviness by 30- 50%    Time 3    Period Weeks    Status New    Target Date 02/14/21      PT LONG TERM GOAL #3   Title Pt will be knowledgeable of lymphedema risk reduction and how to sue prophylactic sleeve for flying    Time 3    Status New    Target Date 02/14/21      PT LONG TERM GOAL #4   Title pt will have a compression bra to help decrease edema in right breast    Time 3    Period Weeks    Status New    Target Date 02/14/21                   Plan - 01/29/21 1514     Clinical Impression Statement Pt continues with right breast swelling with  fibrosis.  Therapist performed right breast MLD and instructed pt in same with  pt performing all steps.  She did quite well, but did require VC's and TC's for sequence and technique.  She had difficulty with diaphragmatic breathing.She will benefit from continued review.    Personal Factors and Comorbidities Comorbidity 2;Comorbidity 3+    Comorbidities diabetes, bilateral knee arthritis, chemotherapy, Radiation    Examination-Activity Limitations Reach Overhead;Carry;Bathing;Dressing    Stability/Clinical Decision Making Stable/Uncomplicated    Rehab Potential Good    PT Frequency 3x / week    PT Duration 3 weeks    PT Treatment/Interventions ADLs/Self Care Home Management;Manual techniques;Manual lymph drainage;Taping;Vasopneumatic Device;Therapeutic exercise    PT Next Visit Plan did pt get compression bra, Continue Right breast MLD and  instructing pt, foam in bra prn for fibrosis    PT Home Exercise Plan right breast MLD;handout given    Consulted and Agree with Plan of Care Patient             Patient will benefit from skilled therapeutic intervention in order to improve the following deficits and impairments:  Postural dysfunction, Pain, Decreased knowledge of precautions, Increased edema  Visit Diagnosis: Abnormal posture  Aftercare following surgery for neoplasm  Secondary lymphedema  Breast swelling  Stiffness of right shoulder, not elsewhere classified  Malignant neoplasm of right female breast, unspecified estrogen receptor status, unspecified site of breast [facility])     Problem List Patient Active Problem List   Diagnosis Date Noted   Aortic atherosclerosis ([place]) 04/24/2020   Deep venous thrombosis of left upper extremity ([place]) 04/24/2020   Port-A-Cath in place 03/26/2020   Malignant neoplasm of upper-outer quadrant of right breast in female, estrogen receptor positive ([place]) 02/13/2020    Claris Pong, PT 01/29/2021, 6:19 PM  [place], [place],  [address] Phone: [phone]   Fax:  [phone]  Name: [name] [name] MRN: [id number] Date of Birth: 1956/01/09

## 2021-01-30 ENCOUNTER — Inpatient Hospital Stay: Payer: No Typology Code available for payment source | Admitting: Oncology

## 2021-01-30 ENCOUNTER — Ambulatory Visit: Payer: No Typology Code available for payment source | Admitting: Rehabilitation

## 2021-01-30 ENCOUNTER — Encounter: Payer: Self-pay | Admitting: Rehabilitation

## 2021-01-30 DIAGNOSIS — N63 Unspecified lump in unspecified breast: Secondary | ICD-10-CM

## 2021-01-30 DIAGNOSIS — Z483 Aftercare following surgery for neoplasm: Secondary | ICD-10-CM

## 2021-01-30 DIAGNOSIS — R293 Abnormal posture: Secondary | ICD-10-CM

## 2021-01-30 DIAGNOSIS — I890 Lymphedema, not elsewhere classified: Secondary | ICD-10-CM

## 2021-01-30 NOTE — Therapy (Signed)
[place], [place], [address] Phone: [phone]   Fax:  [phone]  Physical Therapy Treatment  Patient Details  Name: [name] [name] MRN: [id number] Date of Birth: 1955-10-04 Referring Provider (PT): Dr, Isidore Moos   Encounter Date: 01/30/2021   PT End of Session - 01/30/21 1156     Visit Number 3    Number of Visits 9    Date for PT Re-Evaluation 02/14/21    PT Start Time 1100    PT Stop Time 1149    PT Time Calculation (min) 49 min    Activity Tolerance Patient tolerated treatment well    Behavior During Therapy [facility] for tasks assessed/performed             Past Medical History:  Diagnosis Date   Arthritis    Cancer ([place])    breast   Diabetes mellitus without complication ([place])    Hypertension     Past Surgical History:  Procedure Laterality Date   BREAST LUMPECTOMY WITH RADIOACTIVE SEED AND SENTINEL LYMPH NODE BIOPSY Right 07/26/2020   Procedure: RIGHT BREAST LUMPECTOMY WITH RADIOACTIVE SEED X 2 AND SENTINEL LYMPH NODE BIOPSY;  Surgeon: Erroll Luna, MD;  Location: [place];  Service: General;  Laterality: Right;   IR IMAGING GUIDED PORT INSERTION  02/23/2020   PORT-A-CATH REMOVAL N/A 07/26/2020   Procedure: REMOVAL PORT-A-CATH;  Surgeon: Erroll Luna, MD;  Location: [place];  Service: General;  Laterality: N/A;    There were no vitals filed for this visit.   Subjective Assessment - 01/30/21 1055     Subjective I got the bra in the mail but it is not good.  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 she ambulates wih a SPC    Currently in Pain? No/denies                                [facility] Adult PT Treatment/Exercise - 01/30/21 0001       Manual Therapy   Manual therapy comments discussed possible alight contribution for a compression bra as pt needs to get one but is uninsured and worried about the cost.  Armed forces operational officer who stated they could most likely cover but will not be able to verifiy until at least 9/26 as someone is on vacation.  Called [employer] who said if pt self pays on a credit card they can reimburse her or she can get fit and hold until 9/26.  Pt aware of options for tomorrow.    Manual Lymphatic Drainage (MLD) therapist performed and instructed pt and then pt. performed, short neck, superficial and deep abdominals, 5 diaphragmatic breaths, activated bil axillary and right inguinal LN's, Anterior interaxillary anastomois, right axillo-inguinal pathway and right breast directing medial side of breast to anterior interaxillary pathway, and lateral breast to axillo inguinal pathway and ending with left axillary and right inguinal LN's. Then in sidelying posterior interaxillary work and work to lateral breast.                          PT Long Term Goals -  01/24/21 2015       PT LONG TERM GOAL #1   Title Pt will be independent in self Right breast MLD    Time 3    Period Weeks    Status New    Target Date 02/14/21      PT LONG TERM GOAL #2   Title Pt will report decreased breast swelling/heaviness by 30- 50%    Time 3    Period Weeks    Status New    Target Date 02/14/21      PT LONG TERM GOAL #3   Title Pt will be knowledgeable of lymphedema risk reduction and how to sue prophylactic sleeve for flying    Time 3    Status New    Target Date 02/14/21      PT LONG TERM GOAL #4   Title pt will have a compression bra to help decrease edema in right breast    Time 3    Period Weeks    Status New    Target Date 02/14/21                   Plan - 01/30/21 1156     Clinical  Impression Statement Continued Rt breast MLD with instruction and review.  Pt has general Rt breast edema but an almost baseball sized seroma noted mid breast.  Pt notes this was drained one time after surgery.  Seroma edges have hardened and feels more firm vs filled again.  Breast softened after MLD.  Pt will be getting a compression bra hopefully tomorrow as her online bra did not work well.  Pt has one more visit unless added from wait list before [place].    PT Next Visit Plan did pt get compression bra, Continue Right breast MLD and  instructing pt, foam in bra prn for fibrosis    Consulted and Agree with Plan of Care Patient             Patient will benefit from skilled therapeutic intervention in order to improve the following deficits and impairments:     Visit Diagnosis: Abnormal posture  Aftercare following surgery for neoplasm  Breast swelling  Secondary lymphedema     Problem List Patient Active Problem List   Diagnosis Date Noted   Aortic atherosclerosis ([place]) 04/24/2020   Deep venous thrombosis of left upper extremity ([place]) 04/24/2020   Port-A-Cath in place 03/26/2020   Malignant neoplasm of upper-outer quadrant of right breast in female, estrogen receptor positive ([place]) 02/13/2020    Stark Bray, PT 01/30/2021, 11:59 AM  [place], [place], [address] Phone: [phone]   Fax:  [phone]  Name: [name] [name] MRN: [id number] Date of Birth: 12/21/1955

## 2021-02-04 ENCOUNTER — Other Ambulatory Visit: Payer: Self-pay

## 2021-02-04 ENCOUNTER — Ambulatory Visit: Payer: No Typology Code available for payment source | Admitting: Rehabilitation

## 2021-02-04 ENCOUNTER — Encounter: Payer: Self-pay | Admitting: Rehabilitation

## 2021-02-04 DIAGNOSIS — N63 Unspecified lump in unspecified breast: Secondary | ICD-10-CM

## 2021-02-04 DIAGNOSIS — I890 Lymphedema, not elsewhere classified: Secondary | ICD-10-CM

## 2021-02-04 DIAGNOSIS — Z483 Aftercare following surgery for neoplasm: Secondary | ICD-10-CM

## 2021-02-04 DIAGNOSIS — R293 Abnormal posture: Secondary | ICD-10-CM

## 2021-02-04 NOTE — Therapy (Signed)
[place], [place], [address] Phone: [phone]   Fax:  [phone]  Physical Therapy Treatment  Patient Details  Name: [name] [name] MRN: [id number] Date of Birth: 01/04/1956 Referring Provider (PT): Dr, Isidore Moos   Encounter Date: 02/04/2021   PT End of Session - 02/04/21 1056     Visit Number 4    Number of Visits 9    Date for PT Re-Evaluation 02/14/21    PT Start Time 1007    PT Stop Time 1054    PT Time Calculation (min) 47 min    Activity Tolerance Patient tolerated treatment well    Behavior During Therapy [facility] for tasks assessed/performed             Past Medical History:  Diagnosis Date   Arthritis    Cancer ([place])    breast   Diabetes mellitus without complication ([place])    Hypertension     Past Surgical History:  Procedure Laterality Date   BREAST LUMPECTOMY WITH RADIOACTIVE SEED AND SENTINEL LYMPH NODE BIOPSY Right 07/26/2020   Procedure: RIGHT BREAST LUMPECTOMY WITH RADIOACTIVE SEED X 2 AND SENTINEL LYMPH NODE BIOPSY;  Surgeon: Erroll Luna, MD;  Location: [place];  Service: General;  Laterality: Right;   IR IMAGING GUIDED PORT INSERTION  02/23/2020   PORT-A-CATH REMOVAL N/A 07/26/2020   Procedure: REMOVAL PORT-A-CATH;  Surgeon: Erroll Luna, MD;  Location: [place];  Service: General;  Laterality: N/A;    There were no vitals filed for this visit.   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 she ambulates wih a SPC    Currently in Pain? No/denies                                [facility] Adult PT Treatment/Exercise - 02/04/21 0001       Manual Therapy   Manual Therapy Edema management    Manual therapy comments pt wearing a less compression anita bra. reports she tried a [place] but it kept rolling.  After making a large chip pack for the bra it did not have much compression against it.  Will email a special to place to make sure she can get a different bra wihtout having to pay for a new one.    Manual Lymphatic Drainage (MLD) therapist performed and instructed pt and then pt. performed, short neck, superficial and deep abdominals, 5 diaphragmatic breaths, activated bil axillary and right inguinal LN's, Anterior interaxillary anastomois, right axillo-inguinal pathway and right breast directing medial side of breast to anterior interaxillary pathway, and lateral breast to axillo inguinal pathway and ending with left axillary and right inguinal LN's. Then in sidelying posterior interaxillary work and work to lateral breast.                          PT Long Term Goals - 01/24/21 2015       PT LONG TERM GOAL #1   Title Pt will be independent in self Right breast MLD  Time 3    Period Weeks    Status New    Target Date 02/14/21      PT LONG TERM GOAL #2   Title Pt will report decreased breast swelling/heaviness by 30- 50%    Time 3    Period Weeks    Status New    Target Date 02/14/21      PT LONG TERM GOAL #3   Title Pt will be knowledgeable of lymphedema risk reduction and how to sue prophylactic sleeve for flying    Time 3    Status New    Target Date 02/14/21      PT LONG TERM GOAL #4   Title pt will have a compression bra to help decrease edema in right breast    Time 3    Period Weeks    Status New    Target Date 02/14/21                   Plan - 02/04/21 1056     Clinical Impression Statement Rt breast much softer today with most general edema noted and the seroma feeling more of a golf ball size.  Much improvement  overall.  Hoping pt can get a more compressive bra for chip pack use.    PT Frequency 3x / week    PT Duration 3 weeks    PT Treatment/Interventions ADLs/Self Care Home Management;Manual techniques;Manual lymph drainage;Taping;Vasopneumatic Device;Therapeutic exercise    PT Next Visit Plan did pt get compression bra changed? hear back from alight for any assistance?, Continue Right breast MLD and  instructing pt, foam in bra prn for fibrosis    Consulted and Agree with Plan of Care Patient             Patient will benefit from skilled therapeutic intervention in order to improve the following deficits and impairments:     Visit Diagnosis: Abnormal posture  Aftercare following surgery for neoplasm  Breast swelling  Secondary lymphedema     Problem List Patient Active Problem List   Diagnosis Date Noted   Aortic atherosclerosis ([place]) 04/24/2020   Deep venous thrombosis of left upper extremity ([place]) 04/24/2020   Port-A-Cath in place 03/26/2020   Malignant neoplasm of upper-outer quadrant of right breast in female, estrogen receptor positive ([place]) 02/13/2020    Stark Bray, PT 02/04/2021, 10:58 AM  [place], [place], [address] Phone: [phone]   Fax:  [phone]  Name: Luvia Orzechowski MRN: [id number] Date of Birth: 08-18-1955

## 2021-02-06 ENCOUNTER — Ambulatory Visit: Payer: No Typology Code available for payment source | Admitting: Rehabilitation

## 2021-02-06 ENCOUNTER — Other Ambulatory Visit: Payer: Self-pay

## 2021-02-06 ENCOUNTER — Encounter: Payer: Self-pay | Admitting: Rehabilitation

## 2021-02-06 DIAGNOSIS — R293 Abnormal posture: Secondary | ICD-10-CM

## 2021-02-06 DIAGNOSIS — N63 Unspecified lump in unspecified breast: Secondary | ICD-10-CM

## 2021-02-06 DIAGNOSIS — Z483 Aftercare following surgery for neoplasm: Secondary | ICD-10-CM

## 2021-02-06 NOTE — Therapy (Signed)
[place], [place], [address] Phone: [phone]   Fax:  [phone]  Physical Therapy Treatment  Patient Details  Name: [name] [name] MRN: [id number] Date of Birth: 10-27-1955 Referring Provider (PT): Dr, Isidore Moos   Encounter Date: 02/06/2021   PT End of Session - 02/06/21 1051     Visit Number 5    Number of Visits 9    Date for PT Re-Evaluation 02/14/21    PT Start Time 1005    PT Stop Time 1045    PT Time Calculation (min) 40 min    Activity Tolerance Patient tolerated treatment well    Behavior During Therapy [facility] for tasks assessed/performed             Past Medical History:  Diagnosis Date   Arthritis    Cancer ([place])    breast   Diabetes mellitus without complication ([place])    Hypertension     Past Surgical History:  Procedure Laterality Date   BREAST LUMPECTOMY WITH RADIOACTIVE SEED AND SENTINEL LYMPH NODE BIOPSY Right 07/26/2020   Procedure: RIGHT BREAST LUMPECTOMY WITH RADIOACTIVE SEED X 2 AND SENTINEL LYMPH NODE BIOPSY;  Surgeon: Erroll Luna, MD;  Location: [place];  Service: General;  Laterality: Right;   IR IMAGING GUIDED PORT INSERTION  02/23/2020   PORT-A-CATH REMOVAL N/A 07/26/2020   Procedure: REMOVAL PORT-A-CATH;  Surgeon: Erroll Luna, MD;  Location: [place];  Service: General;  Laterality: N/A;    There were no vitals filed for this visit.   Subjective Assessment - 02/06/21 1005     Subjective I saw Dr. Brantley Stage yesterday and he was happy. Michela Pitcher it would be like this for awhile.  I feel ok for this to be my last day.    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 she ambulates wih a SPC     Currently in Pain? No/denies                               [facility] Adult PT Treatment/Exercise - 02/06/21 0001       Manual Therapy   Manual Therapy Soft tissue mobilization;Passive ROM    Soft tissue mobilization with cocoa butter to the left pectoralis    Manual Lymphatic Drainage (MLD) therapist performed and instructed pt and then pt. performed, short neck, superficial and deep abdominals, 5 diaphragmatic breaths, activated bil axillary and right inguinal LN's, Anterior interaxillary anastomois, right axillo-inguinal pathway and right breast directing medial side of breast to anterior interaxillary pathway, and lateral breast to axillo inguinal pathway and ending with left axillary and right inguinal LN's. Then in sidelying posterior interaxillary work and work to lateral breast.    Passive ROM to the left shoulder into Diagonal D2 flexion x 5 during STM of the pectoralis                          PT Long Term Goals - 02/06/21 1053       PT LONG TERM GOAL #1   Title Pt will be independent in self Right breast MLD    Status Achieved      PT LONG TERM  GOAL #2   Title Pt will report decreased breast swelling/heaviness by 30- 50%    Status Achieved      PT LONG TERM GOAL #3   Title Pt will be knowledgeable of lymphedema risk reduction and how to sue prophylactic sleeve for flying    Status Achieved      PT LONG TERM GOAL #4   Title pt will have a compression bra to help decrease edema in right breast    Status Achieved                   Plan - 02/06/21 1051     Clinical Impression Statement Pt continues with improved breast status.  Still has the golf ball sized hard border seroma at the center of the breast.  Due to pt self pay and going back to [place] pt will be D/C'd today with independence with self MLD, compression bra, and use of foam insert.  Pt knows she can return at any time.    Consulted and Agree with Plan of Care Patient              Patient will benefit from skilled therapeutic intervention in order to improve the following deficits and impairments:     Visit Diagnosis: Abnormal posture  Aftercare following surgery for neoplasm  Breast swelling     Problem List Patient Active Problem List   Diagnosis Date Noted   Aortic atherosclerosis ([place]) 04/24/2020   Deep venous thrombosis of left upper extremity ([place]) 04/24/2020   Port-A-Cath in place 03/26/2020   Malignant neoplasm of upper-outer quadrant of right breast in female, estrogen receptor positive ([place]) 02/13/2020    Stark Bray, PT 02/06/2021, 10:53 AM  [place], [place], [address] Phone: [phone]   Fax:  [phone]  Name: [name] [name] MRN: [id number] Date of Birth: [birth year]   PHYSICAL THERAPY DISCHARGE SUMMARY  Visits from Start of Care: 5  Current functional level related to goals / functional outcomes: See above   Remaining deficits: Breast seroma   Education / Equipment: Self MLD, bra, foam insert Plan: Patient agrees to discharge.  Patient goals were not met. Patient is being discharged due to meeting the stated rehab goals.

## 2021-02-07 ENCOUNTER — Other Ambulatory Visit: Payer: Self-pay | Admitting: Obstetrics and Gynecology

## 2021-02-07 ENCOUNTER — Encounter: Payer: Self-pay | Admitting: Oncology

## 2021-02-07 ENCOUNTER — Ambulatory Visit
Admission: RE | Admit: 2021-02-07 | Discharge: 2021-02-07 | Disposition: A | Discharge status: Home / Self Care | Payer: No Typology Code available for payment source | Source: Ambulatory Visit | Attending: Obstetrics and Gynecology | Admitting: Obstetrics and Gynecology

## 2021-02-07 ENCOUNTER — Ambulatory Visit: Payer: Self-pay | Admitting: *Deleted

## 2021-02-07 VITALS — BP 128/80 | Wt 200.8 lb

## 2021-02-07 DIAGNOSIS — Z170 Estrogen receptor positive status [ER+]: Secondary | ICD-10-CM

## 2021-02-07 DIAGNOSIS — Z01419 Encounter for gynecological examination (general) (routine) without abnormal findings: Secondary | ICD-10-CM

## 2021-02-07 DIAGNOSIS — C50411 Malignant neoplasm of upper-outer quadrant of right female breast: Secondary | ICD-10-CM

## 2021-02-07 DIAGNOSIS — Z1211 Encounter for screening for malignant neoplasm of colon: Secondary | ICD-10-CM

## 2021-02-07 DIAGNOSIS — N6310 Unspecified lump in the right breast, unspecified quadrant: Secondary | ICD-10-CM

## 2021-02-07 HISTORY — DX: Personal history of antineoplastic chemotherapy: Z92.21

## 2021-02-07 HISTORY — DX: Personal history of irradiation: Z92.3

## 2021-02-07 IMAGING — US US BREAST*R* LIMITED INC AXILLA
1 series · 10 of 10 positions shown · non-contrast
Comparison: Previous exam(s).

CLINICAL DATA: 64-year-old female with history of RIGHT breast
cancer and lumpectomy on [DATE]. Now with palpable RIGHT breast
thickening, without fever or redness.

EXAM:
DIGITAL DIAGNOSTIC BILATERAL MAMMOGRAM WITH TOMOSYNTHESIS AND CAD;
ULTRASOUND RIGHT BREAST LIMITED
TECHNIQUE: Bilateral digital diagnostic mammography and breast tomosynthesis
was performed. The images were evaluated with computer-aided
detection.; Targeted ultrasound examination of the right breast was
performed

[Series 1: us breast*right* limited inc axilla · 0.13mm/px · 10 of 10 slices shown]
[im 1/10]
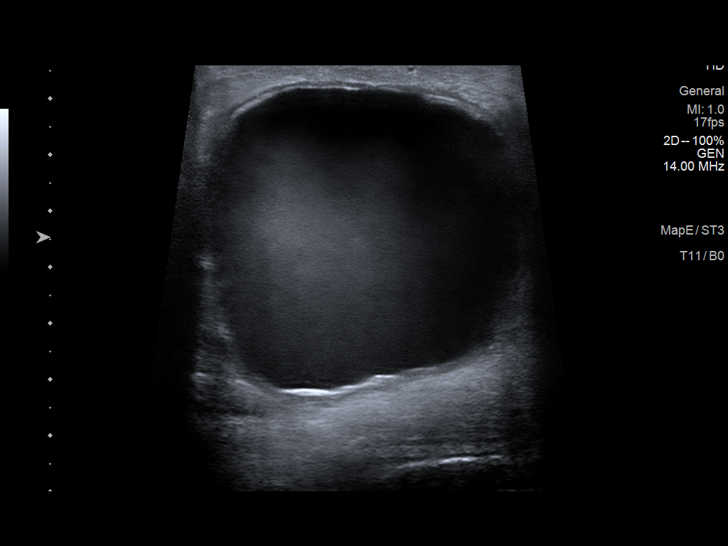
[im 2/10]
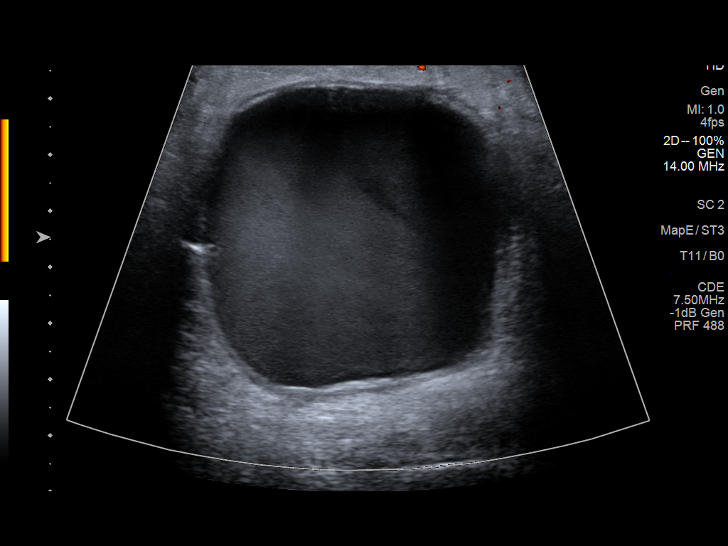
[im 3/10]
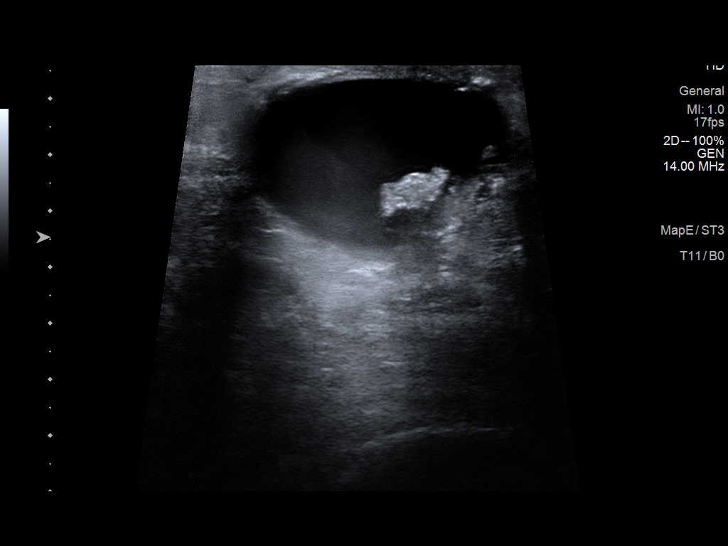
[im 4/10]
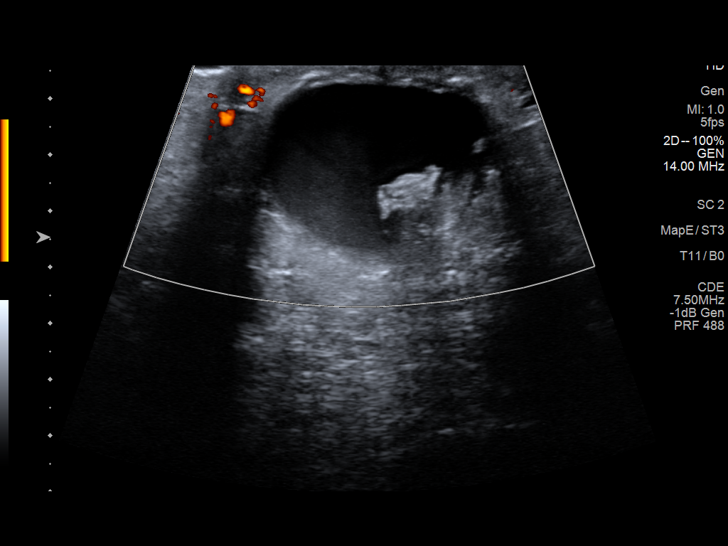
[im 5/10]
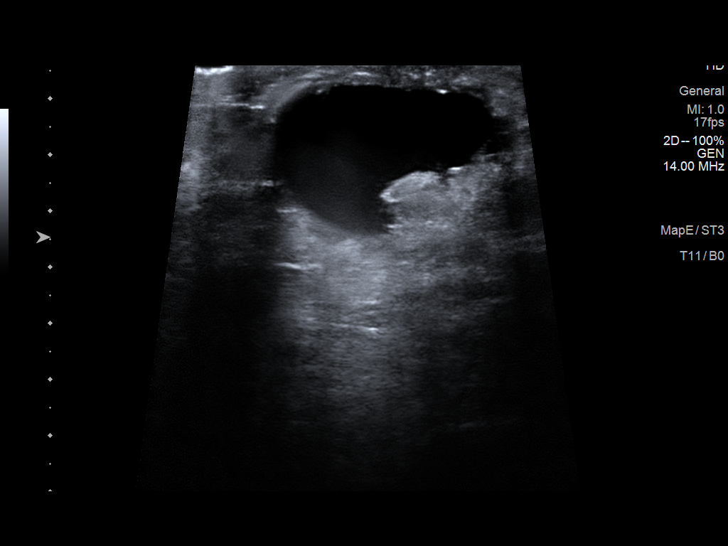
[im 6/10]
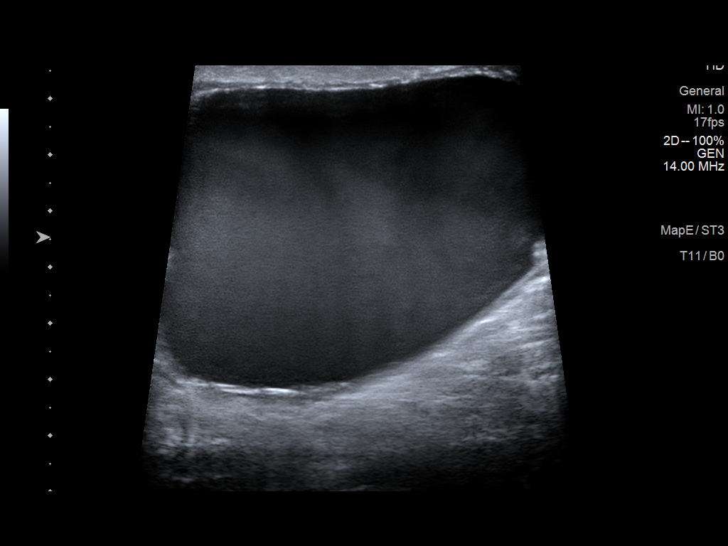
[im 7/10]
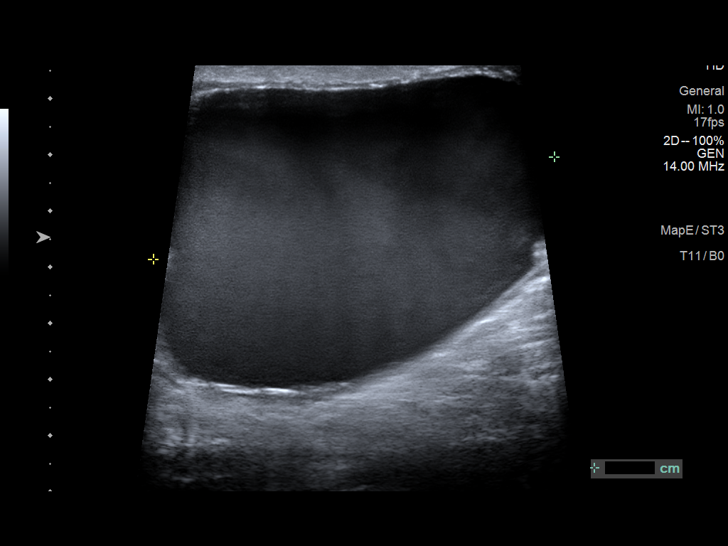
[im 8/10]
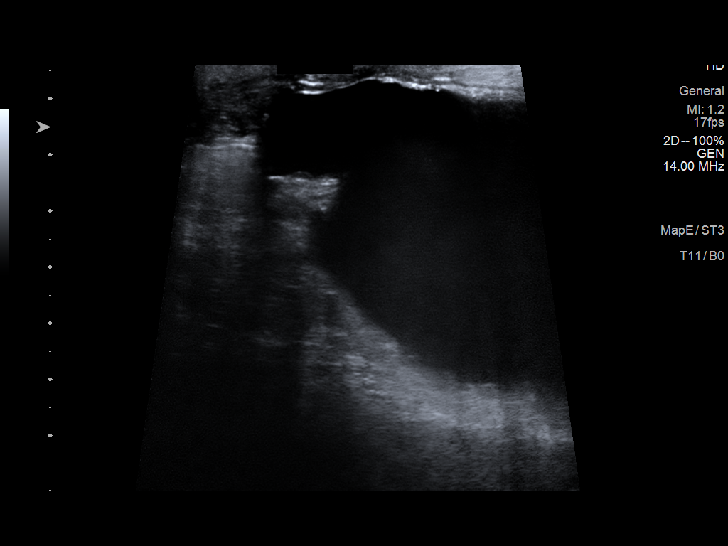
[im 9/10]
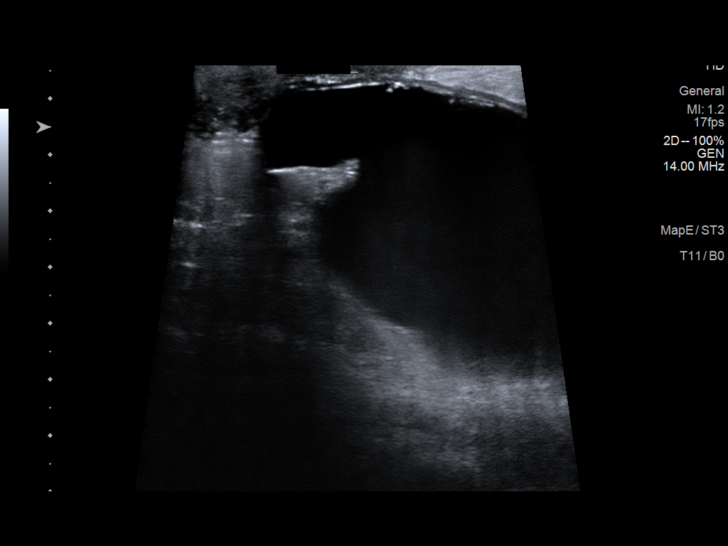
[im 10/10]
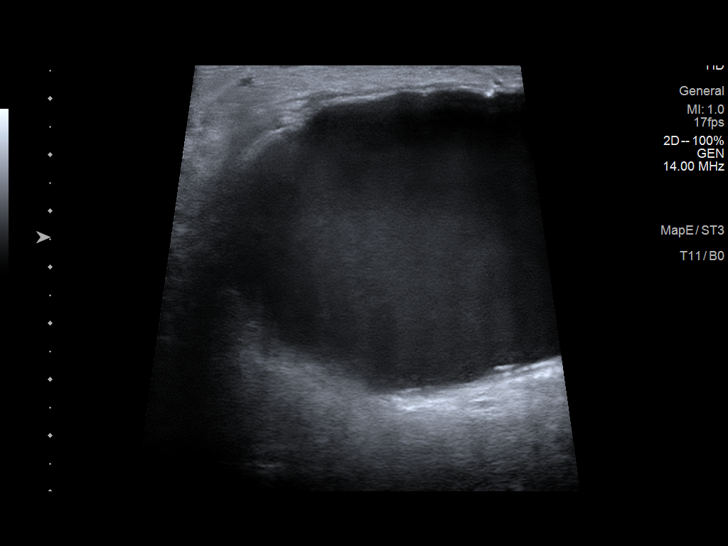

[10 of 10 positions shown; findings below may reference images not displayed]

ACR Breast Density Category b: There are scattered areas of
fibroglandular density.
FINDINGS: 2D and 3D full field views of both breasts and magnification/spot
compression views of the lumpectomy site are performed.

Lumpectomy changes RETROAREOLAR/central RIGHT breast are noted with
associated 7 cm oval structure.

LEFT breast treatment changes are noted.

No suspicious mammographic abnormalities are noted within either
breast.

On physical exam, fullness in the UPPER RIGHT breast identified.

Targeted ultrasound is performed, showing a 6.3 x 5.3 x 7.3 cm
complicated collection within the UPPER RIGHT breast centered at the
12 o'clock position 3 cm from the nipple.
IMPRESSION: 1. 7.3 cm postoperative collection in the UPPER RIGHT breast
accounting for the patient's area of palpable fullness/thickening.
2. RIGHT breast surgical and treatment changes.
3. No evidence of breast malignancy.

RECOMMENDATION:
Consider clinical follow-up as indicated. Ultrasound-guided
aspiration of this RIGHT breast postoperative collection can be
performed if indicated.

Bilateral diagnostic mammogram in 1 year.

I have discussed the findings and recommendations with the patient.
If applicable, a reminder letter will be sent to the patient
regarding the next appointment.

BI-RADS CATEGORY  2: Benign.

## 2021-02-07 IMAGING — MG DIGITAL DIAGNOSTIC BILAT W/ TOMO W/ CAD
6 of 11 series · 6 of 31 positions shown · non-contrast
Comparison: Previous exam(s).

CLINICAL DATA: 64-year-old female with history of RIGHT breast
cancer and lumpectomy on [DATE]. Now with palpable RIGHT breast
thickening, without fever or redness.

EXAM:
DIGITAL DIAGNOSTIC BILATERAL MAMMOGRAM WITH TOMOSYNTHESIS AND CAD;
ULTRASOUND RIGHT BREAST LIMITED
TECHNIQUE: Bilateral digital diagnostic mammography and breast tomosynthesis
was performed. The images were evaluated with computer-aided
detection.; Targeted ultrasound examination of the right breast was
performed

[R CC]
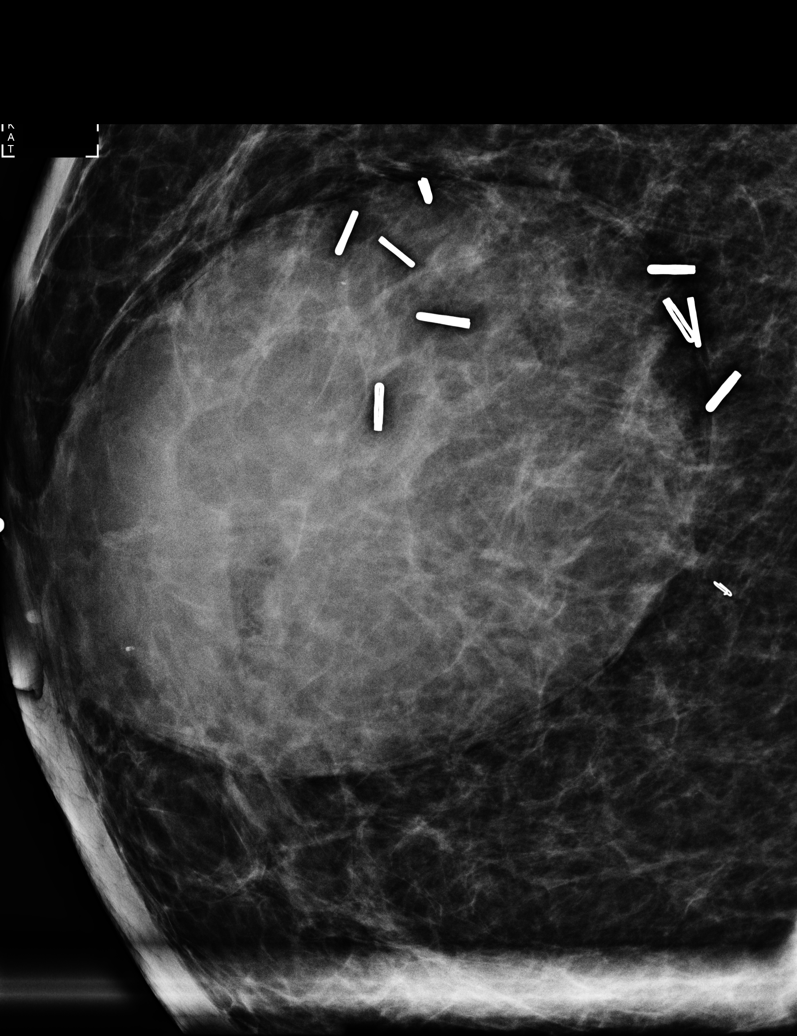

[R MLO synth-2D]
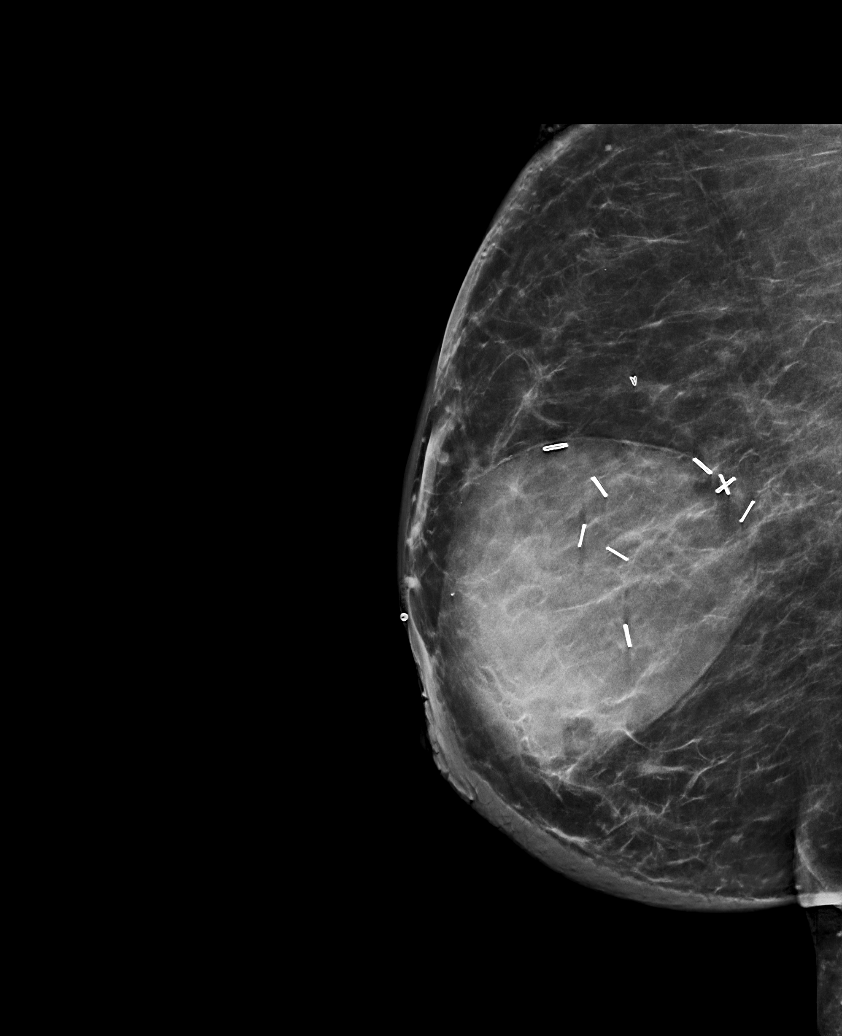

[L CC synth-2D]
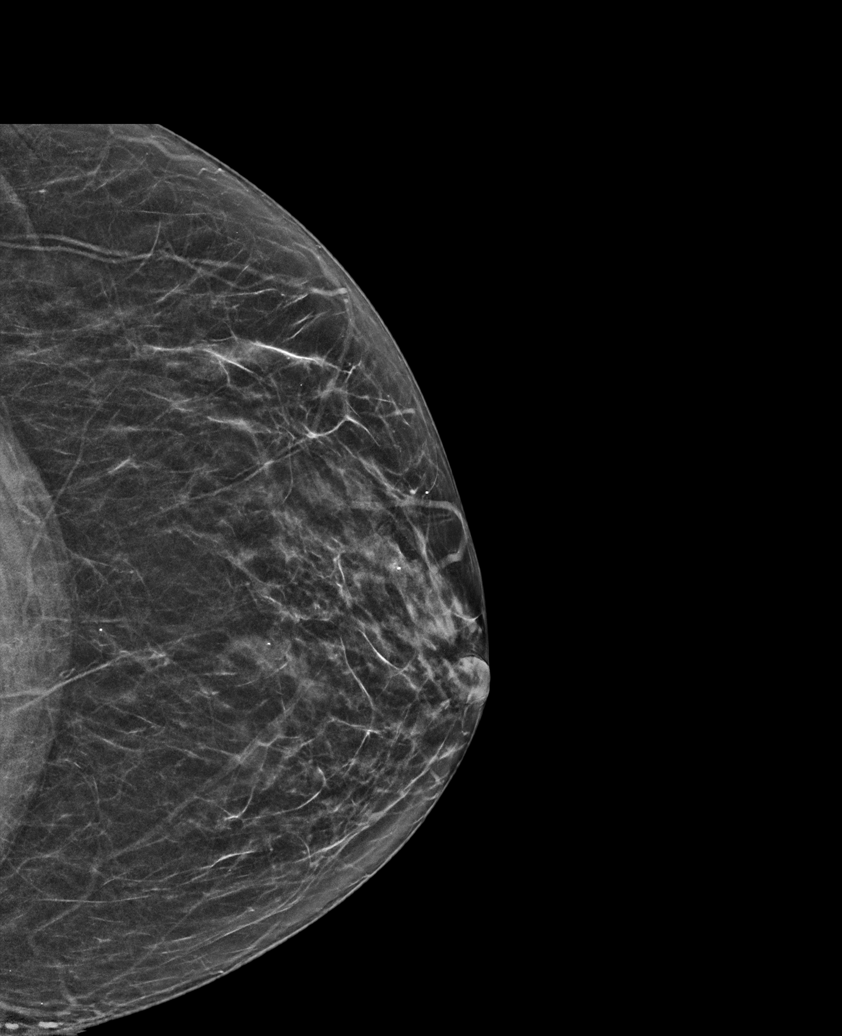

[R CC synth-2D]
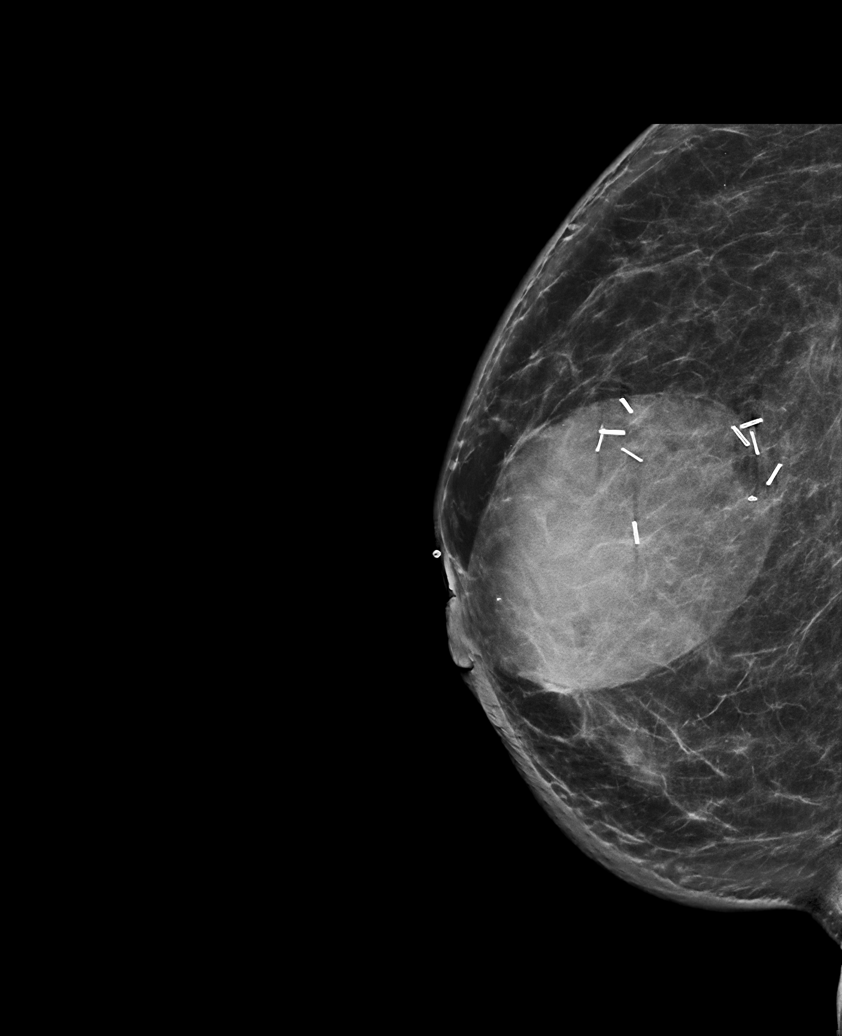

[R TAN synth-2D]
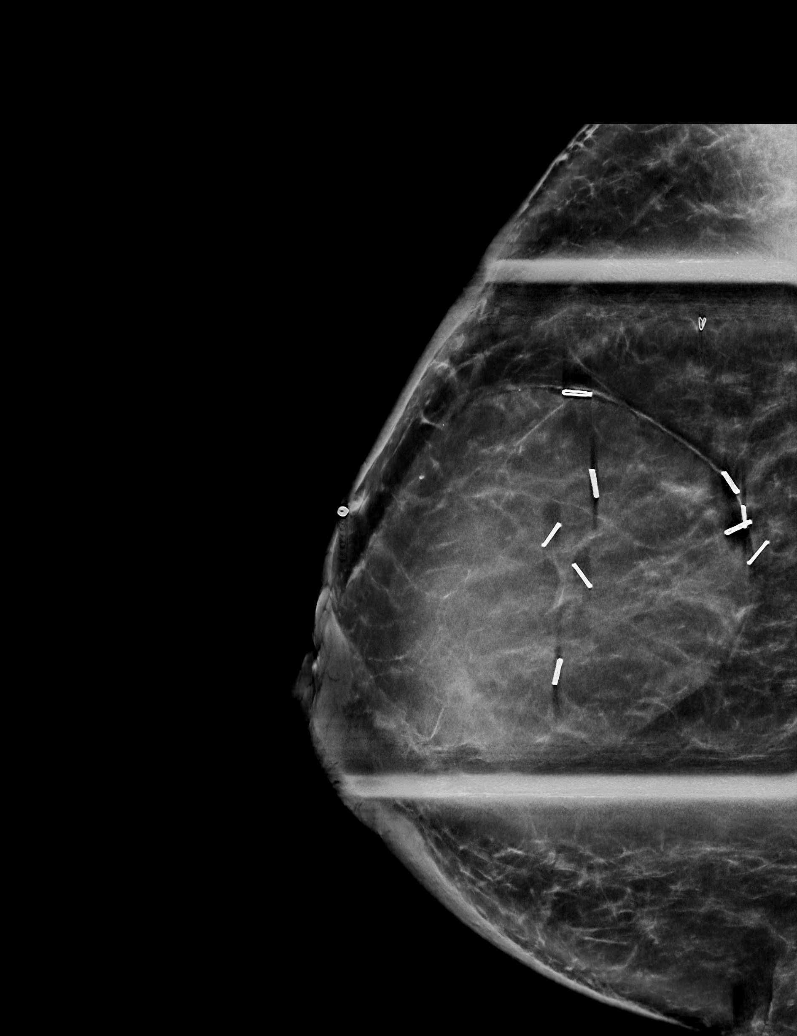

[L MLO synth-2D]
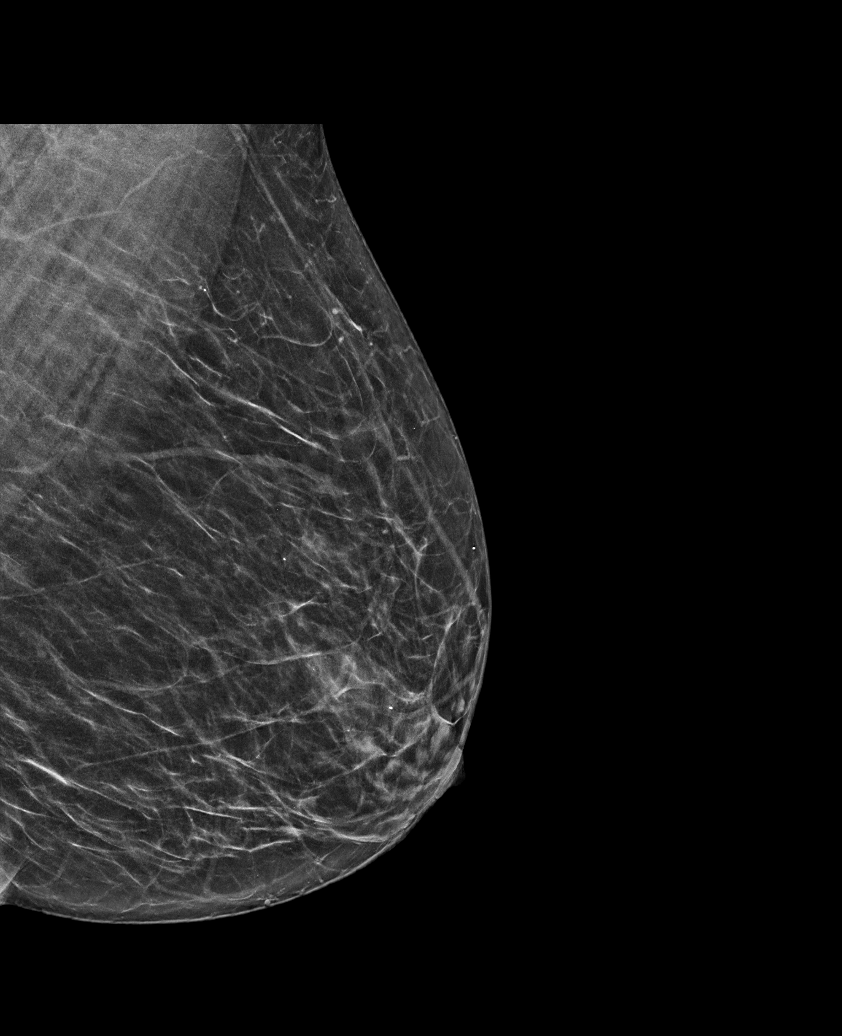

[6 of 31 positions shown; findings below may reference images not displayed]

ACR Breast Density Category b: There are scattered areas of
fibroglandular density.
FINDINGS: 2D and 3D full field views of both breasts and magnification/spot
compression views of the lumpectomy site are performed.

Lumpectomy changes RETROAREOLAR/central RIGHT breast are noted with
associated 7 cm oval structure.

LEFT breast treatment changes are noted.

No suspicious mammographic abnormalities are noted within either
breast.

On physical exam, fullness in the UPPER RIGHT breast identified.

Targeted ultrasound is performed, showing a 6.3 x 5.3 x 7.3 cm
complicated collection within the UPPER RIGHT breast centered at the
12 o'clock position 3 cm from the nipple.
IMPRESSION: 1. 7.3 cm postoperative collection in the UPPER RIGHT breast
accounting for the patient's area of palpable fullness/thickening.
2. RIGHT breast surgical and treatment changes.
3. No evidence of breast malignancy.

RECOMMENDATION:
Consider clinical follow-up as indicated. Ultrasound-guided
aspiration of this RIGHT breast postoperative collection can be
performed if indicated.

Bilateral diagnostic mammogram in 1 year.

I have discussed the findings and recommendations with the patient.
If applicable, a reminder letter will be sent to the patient
regarding the next appointment.

BI-RADS CATEGORY  2: Benign.

## 2021-02-07 NOTE — Progress Notes (Signed)
[name] is a 65 y.o. No obstetric history on file. female who presents to [facility] clinic today with complaint of right breast tenderness since radiation treatments that comes and go.    Pap Smear: Pap smear completed today. Last Pap smear was in 2017 at a clinic in the [place] and was normal with negative HPV per patient. Per patient has no history of an abnormal Pap smear. Last Pap smear result is not available in Epic.   Physical exam: Breasts Left breast larger than right breast due to history of right breast lumpectomy for breast cancer 07/26/2020. No skin abnormalities left breast. Radiation changes observed right breast due to history of radiation treatment to breast for breast cancer. No nipple retraction bilateral breasts. No nipple discharge bilateral breasts. No lymphadenopathy. No lumps palpated left breast. Palpated a firm area versus mass within the right center of breast that measures 14 cm x 13 cm. Complaints of tenderness on exam when palpated right breast.  MM Breast Surgical Specimen  Result Date: 07/26/2020 CLINICAL DATA:  Patient status post right lumpectomy and node excision. EXAM: SPECIMEN RADIOGRAPH OF THE RIGHT BREAST COMPARISON:  Previous exam(s). FINDINGS: Status post excision of the right breast. The radioactive seed and biopsy marker clips are present and completely intact. IMPRESSION: Specimen radiograph of the right breast. Electronically Signed   By: Lovey Newcomer M.D.   On: 07/26/2020 16:10   MM Breast Surgical Specimen  Result Date: 07/26/2020 CLINICAL DATA:  Status post right breast lumpectomy. EXAM: SPECIMEN RADIOGRAPH OF THE RIGHT BREAST COMPARISON:  Previous exam(s). FINDINGS: Status post excision of the right breast. The radioactive seed and biopsy marker clip are present and completely intact. IMPRESSION: Specimen radiograph of the right breast. Electronically Signed   By: Lovey Newcomer M.D.   On: 07/26/2020 16:09   MM Breast Surgical Specimen  Result  Date: 07/26/2020 CLINICAL DATA:  Patient status post right breast lumpectomy. EXAM: SPECIMEN RADIOGRAPH OF THE RIGHT BREAST COMPARISON:  Previous exam(s). FINDINGS: Status post excision of the right breast. The radioactive seed and biopsy marker clip are present and completely intact. IMPRESSION: Specimen radiograph of the right breast. Electronically Signed   By: Lovey Newcomer M.D.   On: 07/26/2020 16:09   MS DIGITAL DIAG TOMO BILAT  Result Date: 01/31/2020 CLINICAL DATA:  Patient presents for palpable mass within the right axilla. EXAM: DIGITAL DIAGNOSTIC BILATERAL MAMMOGRAM WITH CAD AND TOMO ULTRASOUND BILATERAL BREAST COMPARISON:  None. ACR Breast Density Category b: There are scattered areas of fibroglandular density. FINDINGS: Underlying the palpable marker within the right axilla are 2 adjacent enlarged lymph nodes. Within the anterior central right breast there are 2 adjacent irregular masses measuring 1.6 and 2.0 cm. Within the upper inner right breast posterior depth there is a persistent small lobular mass. Questioned asymmetry within the outer right breast appeared to resolve with additional imaging. Questioned asymmetry within the anterior left breast appeared to resolve with additional imaging. Mammographic images were processed with CAD. Targeted ultrasound is performed, showing a 1.9 x 1.7 x 1.7 cm irregular hypoechoic right breast mass 12 o'clock position 1 cm from nipple. There is an adjacent 2.0 x 2.3 x 1.0 cm irregular hypoechoic mass right breast 11 o'clock position 3 cm from nipple. There is a 0.4 x 0.3 x 0.3 cm irregular hypoechoic mass right breast 1 o'clock position 8 cm from nipple. There are 2 adjacent enlarged thickened lymph nodes measuring up to 2.1 cm. No suspicious abnormality within the retroareolar left breast.  Dense tissue is visualized. IMPRESSION: 1. There are 3 suspicious masses within the right breast concerning primary breast malignancy. 2. Two adjacent enlarged thickened  right axillary lymph nodes concerning for metastatic adenopathy. RECOMMENDATION: Ultrasound-guided core needle biopsy of the 3 right breast masses and 1 of the thickened right axillary lymph nodes. Patient will be scheduled for 2 separate days. On the first biopsy day, recommend biopsy of the right breast mass 12 o'clock position and 1 of the thickened right axillary lymph nodes. On the second biopsy day, recommend biopsy of the right breast masses 11 o'clock position and 1 o'clock position. I have discussed the findings and recommendations with the patient. If applicable, a reminder letter will be sent to the patient regarding the next appointment. BI-RADS CATEGORY  5: Highly suggestive of malignancy. Electronically Signed   By: Lovey Newcomer M.D.   On: 01/31/2020 13:37   MM CLIP PLACEMENT RIGHT  Result Date: 07/25/2020 CLINICAL DATA:  Status post ultrasound-guided radioactive seed localization invasive ductal carcinomas in the 12 o'clock and 11 o'clock position of the right breast and a metastatic right axillary lymph node. EXAM: DIAGNOSTIC RIGHT MAMMOGRAM POST ULTRASOUND-GUIDED RADIOACTIVE SEED PLACEMENT X 3 COMPARISON:  Previous exam(s). FINDINGS: Mammographic images were obtained following ultrasound-guided radioactive seed placement x 3. These demonstrate a radioactive seed adjacent to the ribbon shaped biopsy marker clip in the 12 o'clock position of the right breast, a radioactive seed adjacent to the coil shaped biopsy marker clip in the 11 o'clock position of the right breast and a radioactive seed in the previously biopsied right axillary lymph node. The HydroMARK clip in the right axilla again could not be included but was well seen at ultrasound in the biopsied and localized lymph node. IMPRESSION: Appropriate positions of 2 radioactive seeds in the right breast and 1 radioactive seed in the right axilla. Final Assessment: Post Procedure Mammograms for Seed Placement Electronically Signed   By: Claudie Revering M.D.   On: 07/25/2020 11:43   MM CLIP PLACEMENT RIGHT  Result Date: 02/06/2020 CLINICAL DATA:  Post biopsy mammogram of the right breast for clip placement. EXAM: DIAGNOSTIC RIGHT MAMMOGRAM POST ULTRASOUND BIOPSY COMPARISON:  Previous exam(s). FINDINGS: Mammographic images were obtained following ultrasound guided biopsy of 2 masses in the right breast. The biopsy marking clips in expected position at the sites of biopsy. IMPRESSION: 1. Appropriate positioning of the coil shaped biopsy marking clip at the site of biopsy in the upper-outer right breast (11 o'clock). 2. Appropriate positioning of the heart shaped biopsy marking clip in the upper inner right breast (1 o'clock). Final Assessment: Post Procedure Mammograms for Marker Placement Electronically Signed   By: Ammie Ferrier M.D.   On: 02/06/2020 10:34   MM CLIP PLACEMENT RIGHT  Result Date: 02/03/2020 CLINICAL DATA:  Status post ultrasound-guided core biopsies of a mass in the 12 o'clock region of the right breast and a right axillary lymph node. EXAM: 3D DIAGNOSTIC right MAMMOGRAM POST ULTRASOUND BIOPSIES COMPARISON:  Previous exam(s). FINDINGS: 3D Mammographic images were obtained following ultrasound guided core biopsies of a mass in the 12 o'clock region of the right breast and a right axillary lymph node. Mammographic images show there is a ribbon shaped clip in the mass in the 12 o'clock region of the right breast. The HydroMARK clip in the right axilla is not visualized secondary to the posterior location of the lymph node. IMPRESSION: Appropriate positioning of the ribbon shaped clip in the 12 o'clock region of the right breast. The  HydroMARK clip was not visualized secondary to the posterior location of the lymph node. Final Assessment: Post Procedure Mammograms for Marker Placement Electronically Signed   By: Lillia Mountain M.D.   On: 02/03/2020 15:15     Pelvic/Bimanual Ext Genitalia No lesions, no swelling and no discharge  observed on external genitalia.        Vagina Vagina pink and normal texture. No lesions or discharge observed in vagina.        Cervix Cervix is present. Cervix pink and of normal texture. No discharge observed.    Uterus Uterus is present and palpable. Uterus in normal position and normal size.        Adnexae Bilateral ovaries present and palpable. No tenderness on palpation.         Rectovaginal No rectal exam completed today since patient had no rectal complaints. No skin abnormalities observed on exam.     Smoking History: Patient has never smoked.   Patient Navigation: Patient education provided. Access to services provided for patient through [facility] program.   Colorectal Cancer Screening: Per patient has never had colonoscopy completed. FIT Test given to patient to complete. No complaints today.    Breast and Cervical Cancer Risk Assessment: Patient does not have family history of breast cancer. Patient has personal history of breast cancer. Patient has no known genetic mutations or history of radiation treatment to the chest before age 40. Patient does not have history of cervical dysplasia, immunocompromised, or DES exposure in-utero.  Risk Assessment     Risk Scores       02/07/2021 01/24/2020   Last edited by: Loletta Parish, RN McGill, Karlton Lemon, LPN   5-year risk: 1.5 % 1.5 %   Lifetime risk: 6.5 % 6.7 %           A: BCCCP exam with pap smear Complaints of right breast tenderness.  P: Referred patient to the [facility] for a diagnostic mammogram per recommendation. Appointment scheduled Thursday, February 07, 2021 at 1010.  Loletta Parish, RN 02/07/2021 8:31 AM

## 2021-02-07 NOTE — Patient Instructions (Addendum)
Explained breast self awareness with [name]. Pap smear completed today. Let her know BCCCP will cover Pap smears and HPV typing every 5 years unless has a history of abnormal Pap smears. Referred patient to the [facility] for a diagnostic mammogram per recommendation. Appointment scheduled Thursday, February 07, 2021 at 1010. Patient aware of appointment and will be there. Let patient know will follow up with her within the next couple weeks with results of Pap smear by phone. [name] verbalized understanding.  Gerard Bonus, Arvil Chaco, RN 8:31 AM

## 2021-02-08 LAB — CYTOLOGY - PAP
Comment: NEGATIVE
Diagnosis: NEGATIVE
High risk HPV: NEGATIVE

## 2021-02-11 ENCOUNTER — Telehealth: Payer: Self-pay

## 2021-02-11 NOTE — Telephone Encounter (Signed)
Per patient's request, Patient's daughter, [name] [name] informed negative Pap/HPV results, next pap may be due in 5 years, but after age 65 years, pap tests are no longer recommended, may not need. [name] verbalized understanding.

## 2021-02-21 ENCOUNTER — Telehealth: Payer: Self-pay

## 2021-02-21 LAB — FECAL OCCULT BLOOD, IMMUNOCHEMICAL: Fecal Occult Bld: NEGATIVE

## 2021-02-21 NOTE — Telephone Encounter (Signed)
Per patient's request, Patient's daughter, [name], informed negative FIT test results.

## 2021-04-19 ENCOUNTER — Other Ambulatory Visit: Payer: Self-pay

## 2021-04-19 ENCOUNTER — Telehealth: Payer: Self-pay | Admitting: *Deleted

## 2021-04-19 DIAGNOSIS — I890 Lymphedema, not elsewhere classified: Secondary | ICD-10-CM

## 2021-04-19 DIAGNOSIS — C50411 Malignant neoplasm of upper-outer quadrant of right female breast: Secondary | ICD-10-CM

## 2021-04-19 DIAGNOSIS — Z170 Estrogen receptor positive status [ER+]: Secondary | ICD-10-CM

## 2021-04-19 NOTE — Telephone Encounter (Signed)
CALLED PATIENT'S DAUGHTER- [name] PATRICE TO INFORM THAT I HAVE CALLED [facility] AND HAVE LEFT A MESSAGE WITH THEM TO GET HER MOM SCHEDULED FOR PHYSICAL THERAPY LYMPHEDEMA TREATMENT, PATIENT'S DAUGHTER [name] PATRICE VERIFIED UNDERSTANDING THIS

## 2021-05-14 ENCOUNTER — Encounter: Payer: Self-pay | Admitting: Rehabilitation

## 2021-05-14 ENCOUNTER — Ambulatory Visit: Payer: No Typology Code available for payment source | Attending: Radiation Oncology | Admitting: Rehabilitation

## 2021-05-14 ENCOUNTER — Other Ambulatory Visit: Payer: Self-pay

## 2021-05-14 DIAGNOSIS — C50411 Malignant neoplasm of upper-outer quadrant of right female breast: Secondary | ICD-10-CM | POA: Insufficient documentation

## 2021-05-14 DIAGNOSIS — Z483 Aftercare following surgery for neoplasm: Secondary | ICD-10-CM | POA: Insufficient documentation

## 2021-05-14 DIAGNOSIS — Z170 Estrogen receptor positive status [ER+]: Secondary | ICD-10-CM | POA: Insufficient documentation

## 2021-05-14 DIAGNOSIS — I890 Lymphedema, not elsewhere classified: Secondary | ICD-10-CM | POA: Insufficient documentation

## 2021-05-14 DIAGNOSIS — N63 Unspecified lump in unspecified breast: Secondary | ICD-10-CM | POA: Insufficient documentation

## 2021-05-14 NOTE — Therapy (Signed)
[facility] @ [address] [place], [place], [address] Phone: [phone]   Fax:  [phone]  Physical Therapy Evaluation  Patient Details  Name: [name] [name] MRN: [id number] Date of Birth: August 23, 1955 Referring Provider (PT): Dr, Isidore Moos   Encounter Date: 05/14/2021   PT End of Session - 05/14/21 1635     Visit Number 1    Number of Visits 2    Date for PT Re-Evaluation 06/25/21    PT Start Time 4401    PT Stop Time 1629    PT Time Calculation (min) 23 min    Activity Tolerance Treatment limited secondary to medical complications (Comment)    Behavior During Therapy [facility] for tasks assessed/performed             Past Medical History:  Diagnosis Date   Arthritis    Cancer ([place])    breast   Diabetes mellitus without complication ([place])    Hypertension    Personal history of chemotherapy    Personal history of radiation therapy     Past Surgical History:  Procedure Laterality Date   BREAST BIOPSY Right 02/03/2020   x2   BREAST BIOPSY Right 02/06/2020   x2   BREAST LUMPECTOMY Right 07/26/2020   BREAST LUMPECTOMY WITH RADIOACTIVE SEED AND SENTINEL LYMPH NODE BIOPSY Right 07/26/2020   Procedure: RIGHT BREAST LUMPECTOMY WITH RADIOACTIVE SEED X 2 AND SENTINEL LYMPH NODE BIOPSY;  Surgeon: Erroll Luna, MD;  Location: [facility];  Service: General;  Laterality: Right;   IR IMAGING GUIDED PORT INSERTION  02/23/2020   PORT-A-CATH REMOVAL N/A 07/26/2020   Procedure: REMOVAL PORT-A-CATH;  Surgeon: Erroll Luna, MD;  Location: [facility];  Service: General;  Laterality: N/A;    There were no vitals filed for this visit.    Subjective Assessment - 05/14/21 1607     Subjective I had a mammogram after I was done here. (8cm seroma noted on the mammogram)  No aspiration.  Then travelled to [place] x 1 month.  It just stays the same. The breast doesn't feels swollen and heavy. It leaks from  the nipple    Pertinent History R breast cancer with mets to lymph nodes, Pt had chemotherapy, lumpectomy with full axillary node dissection on July 16, 2020 and had radiation which ended on 10/17/2020 . 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 she ambulates wih a SPC    Currently in Pain? No/denies                [facility] PT Assessment - 05/14/21 0001       Assessment   Medical Diagnosis R breast cancer    Referring Provider (PT) Dr, Isidore Moos    Hand Dominance Left    Prior Therapy none      Precautions   Precaution Comments lymphedema risk      Restrictions   Weight Bearing Restrictions No    Other Position/Activity Restrictions uses  SPC cane      Balance Screen   Has the patient fallen in the past 6 months No    Has the patient had a decrease in activity level because of a fear of falling?  No    Is the patient reluctant to leave their home because of a fear of falling?  No      Home Ecologist residence    Living Arrangements Children  Available Help at Discharge Family    Type of Home House      Prior Function   Level of [place] Retired    Leisure baby sit, read,      Cognition   Overall Cognitive Status Within Functional Limits for tasks assessed      Observation/Other Assessments   Observations pt able to squeeze out serosanguinous discharge from the nipple picture per medial section  After pt noting this PT stopped evaluation and sent note to Bary Castilla and Varney Biles Martinini for visit request.      Sensation   Additional Comments still numb in axilla                         Objective measurements completed on examination: See above findings.                     PT Long Term Goals - 05/14/21 1638       PT LONG TERM GOAL #1   Title Pt will be independent in self Right breast MLD    Status On-going                     Plan - 05/14/21 1636     Clinical Impression Statement Pt comes back with main complaint as leakage from the nipple.  Pt is experiencing serosanguinous discharge from the nipple x 3 months and has not had any follow up on this.  Pt notes it started after a mammagram and she has just been waiting for it to stop.  Sent note to oncology team regarding this and need for follow up    PT Treatment/Interventions ADLs/Self Care Home Management;Manual techniques;Manual lymph drainage;Taping;Vasopneumatic Device;Therapeutic exercise    PT Next Visit Plan how is nipple? MLD Rt breast?    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

## 2021-05-15 ENCOUNTER — Other Ambulatory Visit: Payer: Self-pay | Admitting: *Deleted

## 2021-05-15 DIAGNOSIS — N6452 Nipple discharge: Secondary | ICD-10-CM

## 2021-05-16 ENCOUNTER — Other Ambulatory Visit: Payer: Self-pay

## 2021-05-16 DIAGNOSIS — N6452 Nipple discharge: Secondary | ICD-10-CM

## 2021-05-21 ENCOUNTER — Other Ambulatory Visit (HOSPITAL_COMMUNITY)
Admission: RE | Admit: 2021-05-21 | Discharge: 2021-05-21 | Disposition: A | Discharge status: Home / Self Care | Payer: No Typology Code available for payment source | Source: Ambulatory Visit | Attending: Obstetrics and Gynecology | Admitting: Obstetrics and Gynecology

## 2021-05-21 ENCOUNTER — Ambulatory Visit: Payer: Self-pay | Admitting: *Deleted

## 2021-05-21 ENCOUNTER — Other Ambulatory Visit: Payer: Self-pay

## 2021-05-21 VITALS — BP 122/78 | Wt 197.7 lb

## 2021-05-21 DIAGNOSIS — Z1239 Encounter for other screening for malignant neoplasm of breast: Secondary | ICD-10-CM

## 2021-05-21 DIAGNOSIS — N6452 Nipple discharge: Secondary | ICD-10-CM | POA: Insufficient documentation

## 2021-05-21 DIAGNOSIS — N6459 Other signs and symptoms in breast: Secondary | ICD-10-CM

## 2021-05-21 NOTE — Patient Instructions (Addendum)
Explained breast self awareness with [name] [name]. Pap smear not completed today due to last Pap smear and HPV typing was 02/07/2021. Let her know B[facility]CP will cover Pap smears and HPV typing every 5 years unless has a history of abnormal Pap smears. Referred patient to the [facility] for a right breast diagnostic mammogram and ultrasound. Appointment scheduled Friday, May 24, 2021 at 0810. Patient aware of appointment and will be there. Let patient know will follow up with her within the next couple weeks with results of breast discharge by phone. [name] [name] verbalized understanding.  [name] [name], Arvil Chaco, RN 1:21 PM

## 2021-05-21 NOTE — Progress Notes (Addendum)
[name] is a 66 y.o. female who presents to [facility] clinic today with complaint of spontaneous right breast bloody discharge and nipple inversion that she first noticed after her last mammogram 02/07/2221..    Pap Smear: Pap smear not completed today. Last Pap smear was 02/07/2021 at [facility] and was normal with negative HPV. Per patient has no history of an abnormal Pap smear. Last Pap smear result is available in Epic.   Physical exam: Breasts Left breast larger than right breast due to history of right breast lumpectomy for breast cancer 07/26/2020. No skin abnormalities left breast. Radiation changes observed right breast due to history of radiation treatment to breast for breast cancer. No nipple retraction left breast. Slight right nipple retraction observed on exam that has been a change since previous exam 02/07/2021. No nipple discharge left breast. Bloody nipple discharge expressed from right breast on exam that began spontaneously. Sample of breast discharge sent to Cytology for evaluation. No lymphadenopathy. No lumps palpated left breast. Palpated a firm area versus mass within the right center of breast that measures 10 cm x 12 cm. No complaints of pain or tenderness on exam.  MM Breast Surgical Specimen  Result Date: 07/26/2020 CLINICAL DATA:  Patient status post right lumpectomy and node excision. EXAM: SPECIMEN RADIOGRAPH OF THE RIGHT BREAST COMPARISON:  Previous exam(s). FINDINGS: Status post excision of the right breast. The radioactive seed and biopsy marker clips are present and completely intact. IMPRESSION: Specimen radiograph of the right breast. Electronically Signed   By: Lovey Newcomer M.D.   On: 07/26/2020 16:10   MM Breast Surgical Specimen  Result Date: 07/26/2020 CLINICAL DATA:  Status post right breast lumpectomy. EXAM: SPECIMEN RADIOGRAPH OF THE RIGHT BREAST COMPARISON:  Previous exam(s). FINDINGS: Status post excision of the right breast. The radioactive seed and  biopsy marker clip are present and completely intact. IMPRESSION: Specimen radiograph of the right breast. Electronically Signed   By: Lovey Newcomer M.D.   On: 07/26/2020 16:09   MM Breast Surgical Specimen  Result Date: 07/26/2020 CLINICAL DATA:  Patient status post right breast lumpectomy. EXAM: SPECIMEN RADIOGRAPH OF THE RIGHT BREAST COMPARISON:  Previous exam(s). FINDINGS: Status post excision of the right breast. The radioactive seed and biopsy marker clip are present and completely intact. IMPRESSION: Specimen radiograph of the right breast. Electronically Signed   By: Lovey Newcomer M.D.   On: 07/26/2020 16:09   MS DIGITAL DIAG TOMO BILAT  Result Date: 02/07/2021 CLINICAL DATA:  66 year old female with history of RIGHT breast cancer and lumpectomy on 07/26/2020. Now with palpable RIGHT breast thickening, without fever or redness. EXAM: DIGITAL DIAGNOSTIC BILATERAL MAMMOGRAM WITH TOMOSYNTHESIS AND CAD; ULTRASOUND RIGHT BREAST LIMITED TECHNIQUE: Bilateral digital diagnostic mammography and breast tomosynthesis was performed. The images were evaluated with computer-aided detection.; Targeted ultrasound examination of the right breast was performed COMPARISON:  Previous exam(s). ACR Breast Density Category b: There are scattered areas of fibroglandular density. FINDINGS: 2D and 3D full field views of both breasts and magnification/spot compression views of the lumpectomy site are performed. Lumpectomy changes RETROAREOLAR/central RIGHT breast are noted with associated 7 cm oval structure. LEFT breast treatment changes are noted. No suspicious mammographic abnormalities are noted within either breast. On physical exam, fullness in the UPPER RIGHT breast identified. Targeted ultrasound is performed, showing a 6.3 x 5.3 x 7.3 cm complicated collection within the UPPER RIGHT breast centered at the 12 o'clock position 3 cm from the nipple. IMPRESSION: 1. 7.3 cm postoperative collection in  the UPPER RIGHT breast  accounting for the patient's area of palpable fullness/thickening. 2. RIGHT breast surgical and treatment changes. 3. No evidence of breast malignancy. RECOMMENDATION: Consider clinical follow-up as indicated. Ultrasound-guided aspiration of this RIGHT breast postoperative collection can be performed if indicated. Bilateral diagnostic mammogram in 1 year. I have discussed the findings and recommendations with the patient. If applicable, a reminder letter will be sent to the patient regarding the next appointment. BI-RADS CATEGORY  2: Benign. Electronically Signed   By: Margarette Canada M.D.   On: 02/07/2021 11:43  MS DIGITAL DIAG TOMO BILAT  Result Date: 01/31/2020 CLINICAL DATA:  Patient presents for palpable mass within the right axilla. EXAM: DIGITAL DIAGNOSTIC BILATERAL MAMMOGRAM WITH CAD AND TOMO ULTRASOUND BILATERAL BREAST COMPARISON:  None. ACR Breast Density Category b: There are scattered areas of fibroglandular density. FINDINGS: Underlying the palpable marker within the right axilla are 2 adjacent enlarged lymph nodes. Within the anterior central right breast there are 2 adjacent irregular masses measuring 1.6 and 2.0 cm. Within the upper inner right breast posterior depth there is a persistent small lobular mass. Questioned asymmetry within the outer right breast appeared to resolve with additional imaging. Questioned asymmetry within the anterior left breast appeared to resolve with additional imaging. Mammographic images were processed with CAD. Targeted ultrasound is performed, showing a 1.9 x 1.7 x 1.7 cm irregular hypoechoic right breast mass 12 o'clock position 1 cm from nipple. There is an adjacent 2.0 x 2.3 x 1.0 cm irregular hypoechoic mass right breast 11 o'clock position 3 cm from nipple. There is a 0.4 x 0.3 x 0.3 cm irregular hypoechoic mass right breast 1 o'clock position 8 cm from nipple. There are 2 adjacent enlarged thickened lymph nodes measuring up to 2.1 cm. No suspicious abnormality  within the retroareolar left breast. Dense tissue is visualized. IMPRESSION: 1. There are 3 suspicious masses within the right breast concerning primary breast malignancy. 2. Two adjacent enlarged thickened right axillary lymph nodes concerning for metastatic adenopathy. RECOMMENDATION: Ultrasound-guided core needle biopsy of the 3 right breast masses and 1 of the thickened right axillary lymph nodes. Patient will be scheduled for 2 separate days. On the first biopsy day, recommend biopsy of the right breast mass 12 o'clock position and 1 of the thickened right axillary lymph nodes. On the second biopsy day, recommend biopsy of the right breast masses 11 o'clock position and 1 o'clock position. I have discussed the findings and recommendations with the patient. If applicable, a reminder letter will be sent to the patient regarding the next appointment. BI-RADS CATEGORY  5: Highly suggestive of malignancy. Electronically Signed   By: Lovey Newcomer M.D.   On: 01/31/2020 13:37   MM CLIP PLACEMENT RIGHT  Result Date: 07/25/2020 CLINICAL DATA:  Status post ultrasound-guided radioactive seed localization invasive ductal carcinomas in the 12 o'clock and 11 o'clock position of the right breast and a metastatic right axillary lymph node. EXAM: DIAGNOSTIC RIGHT MAMMOGRAM POST ULTRASOUND-GUIDED RADIOACTIVE SEED PLACEMENT X 3 COMPARISON:  Previous exam(s). FINDINGS: Mammographic images were obtained following ultrasound-guided radioactive seed placement x 3. These demonstrate a radioactive seed adjacent to the ribbon shaped biopsy marker clip in the 12 o'clock position of the right breast, a radioactive seed adjacent to the coil shaped biopsy marker clip in the 11 o'clock position of the right breast and a radioactive seed in the previously biopsied right axillary lymph node. The HydroMARK clip in the right axilla again could not be included but was well seen  at ultrasound in the biopsied and localized lymph node. IMPRESSION:  Appropriate positions of 2 radioactive seeds in the right breast and 1 radioactive seed in the right axilla. Final Assessment: Post Procedure Mammograms for Seed Placement Electronically Signed   By: Claudie Revering M.D.   On: 07/25/2020 11:43   MM CLIP PLACEMENT RIGHT  Result Date: 02/06/2020 CLINICAL DATA:  Post biopsy mammogram of the right breast for clip placement. EXAM: DIAGNOSTIC RIGHT MAMMOGRAM POST ULTRASOUND BIOPSY COMPARISON:  Previous exam(s). FINDINGS: Mammographic images were obtained following ultrasound guided biopsy of 2 masses in the right breast. The biopsy marking clips in expected position at the sites of biopsy. IMPRESSION: 1. Appropriate positioning of the coil shaped biopsy marking clip at the site of biopsy in the upper-outer right breast (11 o'clock). 2. Appropriate positioning of the heart shaped biopsy marking clip in the upper inner right breast (1 o'clock). Final Assessment: Post Procedure Mammograms for Marker Placement Electronically Signed   By: Ammie Ferrier M.D.   On: 02/06/2020 10:34   MM CLIP PLACEMENT RIGHT  Result Date: 02/03/2020 CLINICAL DATA:  Status post ultrasound-guided core biopsies of a mass in the 12 o'clock region of the right breast and a right axillary lymph node. EXAM: 3D DIAGNOSTIC right MAMMOGRAM POST ULTRASOUND BIOPSIES COMPARISON:  Previous exam(s). FINDINGS: 3D Mammographic images were obtained following ultrasound guided core biopsies of a mass in the 12 o'clock region of the right breast and a right axillary lymph node. Mammographic images show there is a ribbon shaped clip in the mass in the 12 o'clock region of the right breast. The HydroMARK clip in the right axilla is not visualized secondary to the posterior location of the lymph node. IMPRESSION: Appropriate positioning of the ribbon shaped clip in the 12 o'clock region of the right breast. The HydroMARK clip was not visualized secondary to the posterior location of the lymph node. Final  Assessment: Post Procedure Mammograms for Marker Placement Electronically Signed   By: Lillia Mountain M.D.   On: 02/03/2020 15:15    Pelvic/Bimanual Pap is not indicated today per BCCCP guidelines.   Smoking History: Patient has never smoked.   Patient Navigation: Patient education provided. Access to services provided for patient through [facility] program.   Colorectal Cancer Screening: Per patient has never had colonoscopy completed. FIT Test given to patient at [facility] clinic to complete 02/07/2021 that was negative. No complaints today.    Breast and Cervical Cancer Risk Assessment: Patient does not have family history of breast cancer. Patient has personal history of breast cancer. Patient has no known genetic mutations or history of radiation treatment to the chest before age 28. Patient does not have history of cervical dysplasia, immunocompromised, or DES exposure in-utero.  Risk Assessment     Risk Scores       05/21/2021 02/07/2021   Last edited by: Loletta Parish, RN [name] [name], [name] Gold, RN   5-year risk: 1.6 % 1.5 %   Lifetime risk: 6.3 % 6.5 %           A: BCCCP exam without pap smear Complaint of right bloody nipple discharge and nipple inversion.  P: Referred patient to the [facility] for a right breast diagnostic mammogram and ultrasound. Appointment scheduled Friday, May 24, 2021 at 0810.  Loletta Parish, RN 05/21/2021 1:21 PM

## 2021-05-22 LAB — CYTOLOGY - NON PAP

## 2021-05-23 NOTE — Progress Notes (Signed)
The bloody discharge is not suggestive of a malignancy. Let's see what her breast imaging shows on 1/13

## 2021-05-24 ENCOUNTER — Ambulatory Visit
Admission: RE | Admit: 2021-05-24 | Discharge: 2021-05-24 | Disposition: A | Discharge status: Home / Self Care | Payer: No Typology Code available for payment source | Source: Ambulatory Visit | Attending: Obstetrics and Gynecology | Admitting: Obstetrics and Gynecology

## 2021-05-24 ENCOUNTER — Encounter: Payer: Self-pay | Admitting: Oncology

## 2021-05-24 DIAGNOSIS — N6452 Nipple discharge: Secondary | ICD-10-CM

## 2021-05-24 IMAGING — MG MM DIGITAL DIAGNOSTIC UNILAT*R* W/ TOMO W/ CAD
8 series · 8 of 24 positions shown · non-contrast
Comparison: Previous exam(s).

CLINICAL DATA: The patient had right breast surgery in [DATE] for breast cancer. Her first follow-up mammogram was [DATE]. The patient has had bloody and clear right nipple discharge
since that time.

EXAM:
DIGITAL DIAGNOSTIC UNILATERAL RIGHT MAMMOGRAM WITH TOMOSYNTHESIS AND
CAD; ULTRASOUND RIGHT BREAST LIMITED
TECHNIQUE: Right digital diagnostic mammography and breast tomosynthesis was
performed. The images were evaluated with computer-aided detection.;
Targeted ultrasound examination of the right breast was performed

[R MLO synth-2D (1 of 3)]
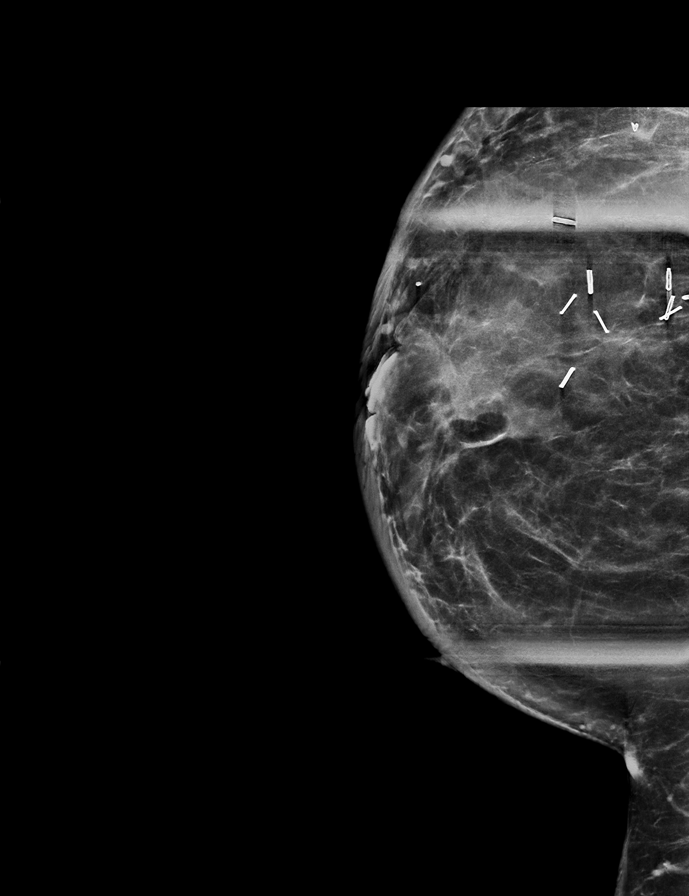

[R MLO synth-2D (2 of 3)]
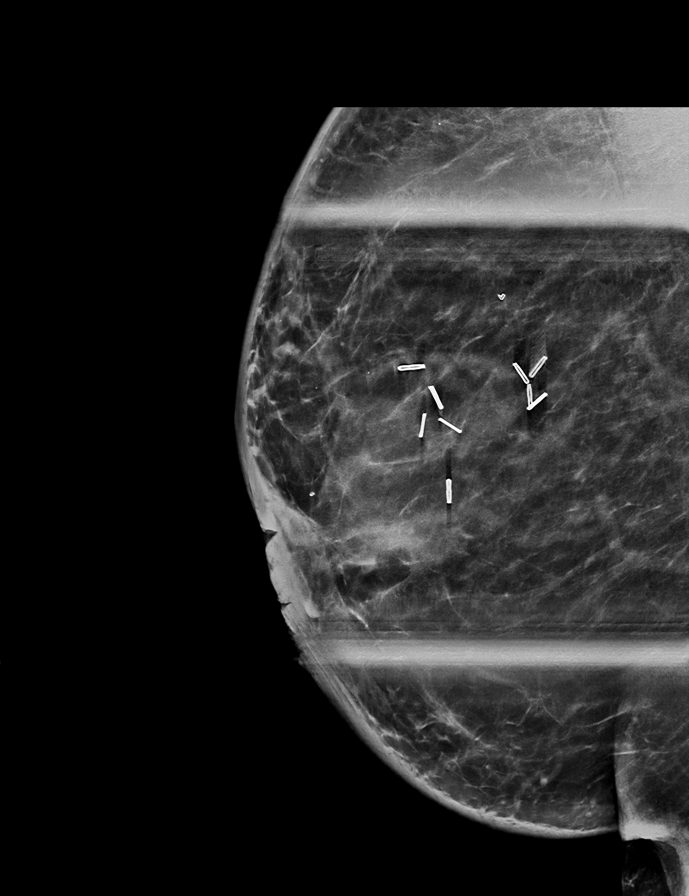

[R CC synth-2D]
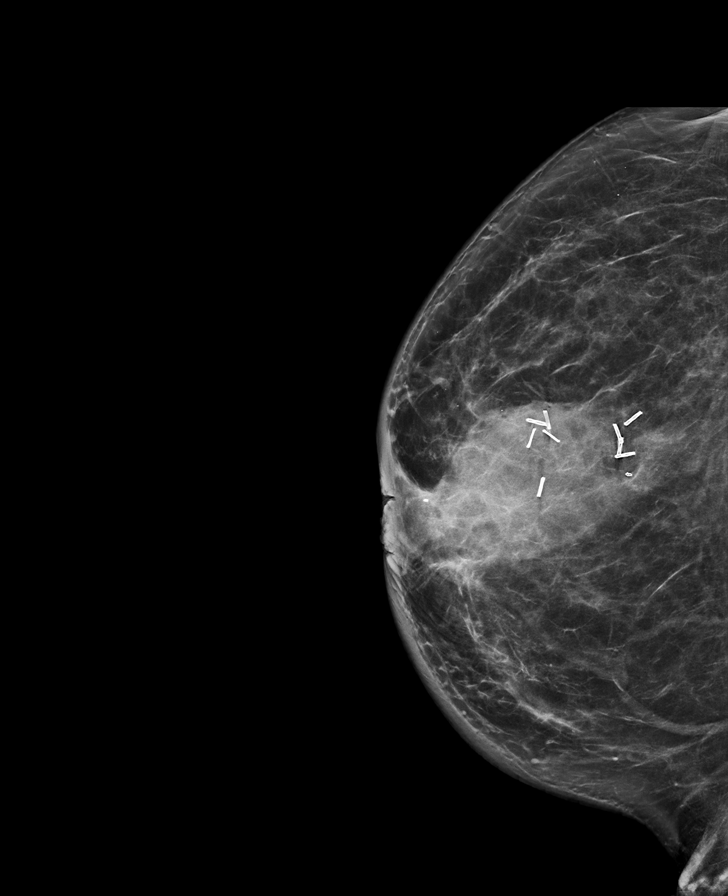

[R MLO synth-2D (3 of 3)]
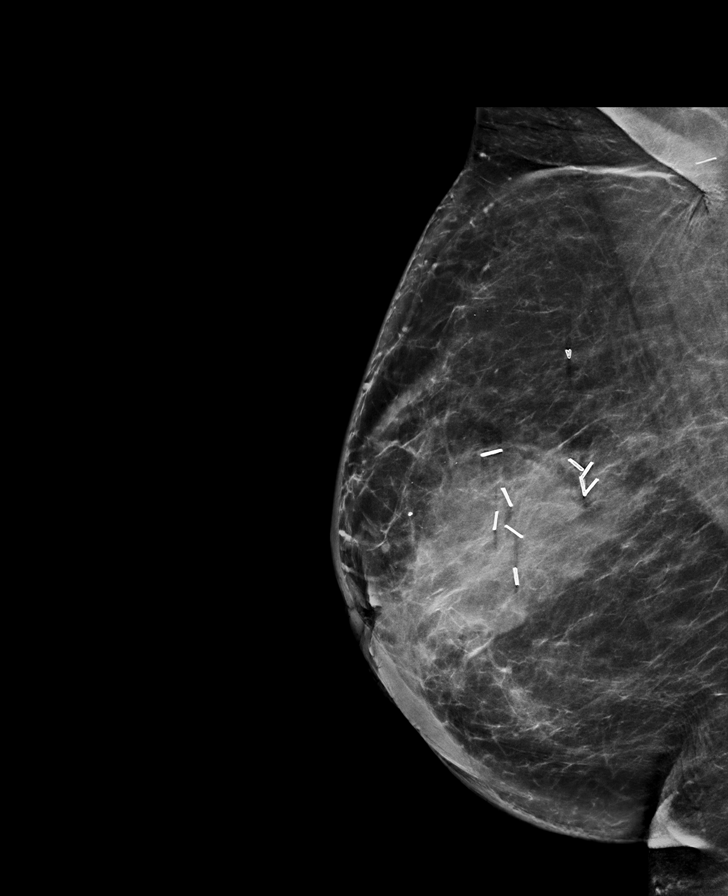

[R MLO tomo (1 of 3) · tomo slice 37/74.0]
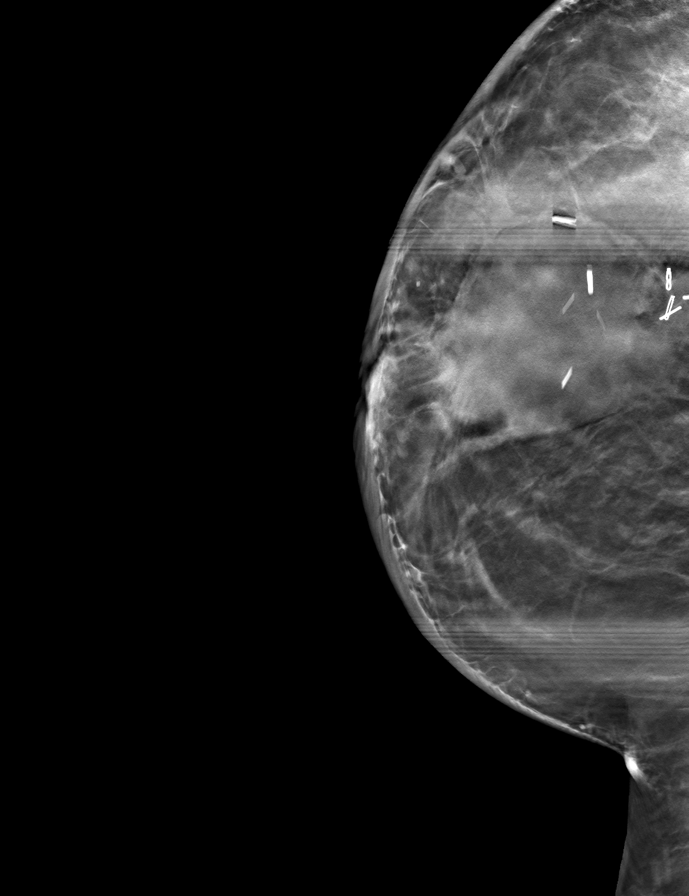

[R CC tomo · tomo slice 42/83.0]
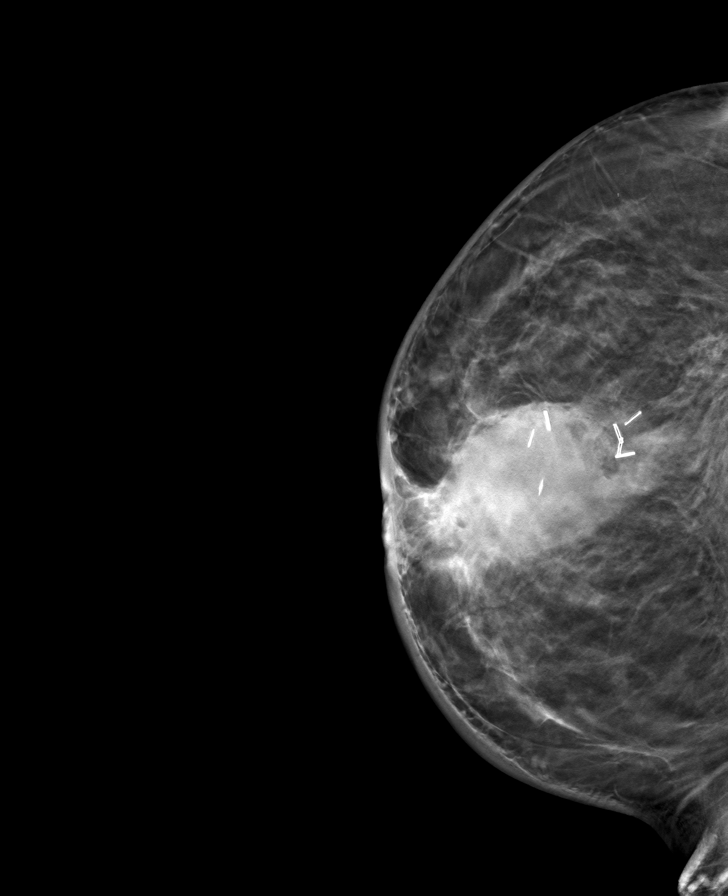

[R MLO tomo (2 of 3) · tomo slice 40/79.0]
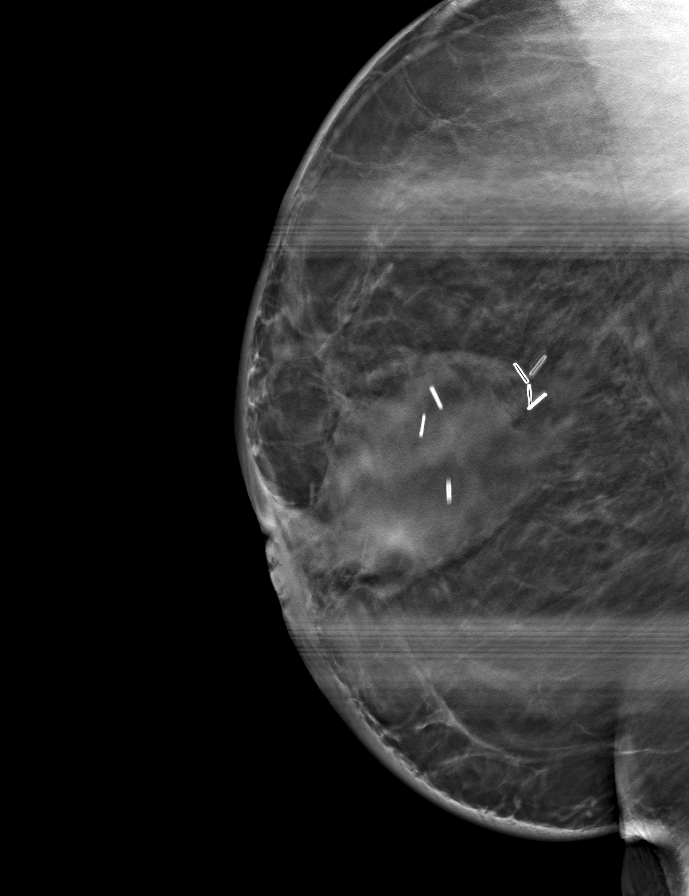

[R MLO tomo (3 of 3) · tomo slice 45/88.0]
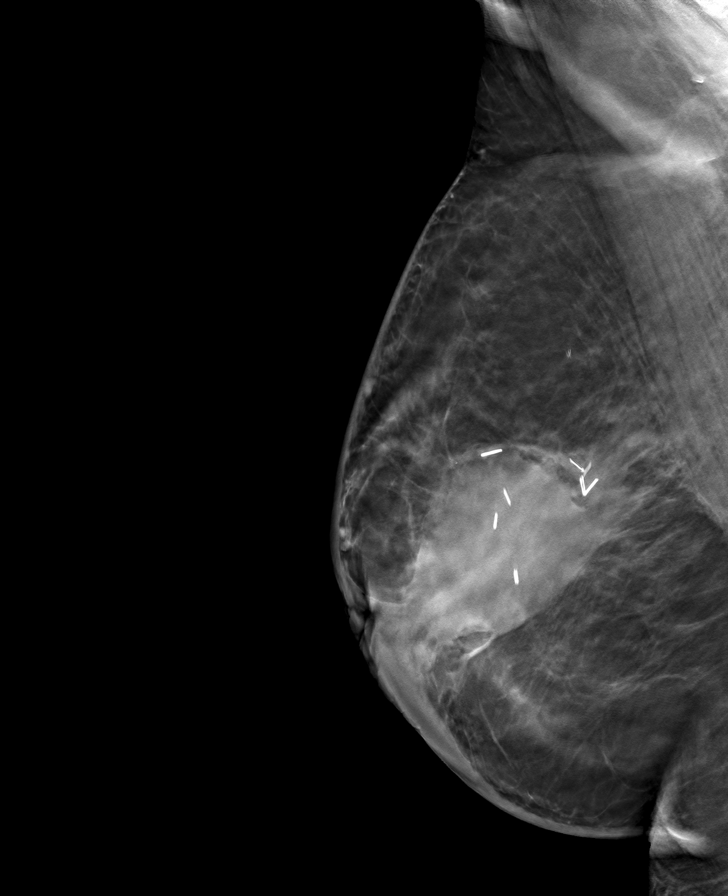

[8 of 24 positions shown; findings below may reference images not displayed]

ACR Breast Density Category b: There are scattered areas of
fibroglandular density.
FINDINGS: The patient's postoperative seroma is smaller compared to [DATE]. Postsurgical changes and post radiation changes are noted.
No other suspicious findings in the right breast.

On physical exam, nipple discharge is elicited when pressure was
placed on the seroma.

Targeted ultrasound is performed, showing the patient is
postoperative seroma with no other abnormalities.
IMPRESSION: No mammographic or sonographic evidence of malignancy.

I suspect the patient's nipple discharge is due to a connection
between the seroma and a milk duct. Underlying malignancy is
considered less likely.

RECOMMENDATION:
Recommend breast MRI to exclude other causes of nipple discharge.
The patient will then be sent to her surgeon after MRI results.

I have discussed the findings and recommendations with the patient.
If applicable, a reminder letter will be sent to the patient
regarding the next appointment.

BI-RADS CATEGORY  2: Benign.

## 2021-05-24 IMAGING — US US BREAST*R* LIMITED INC AXILLA
1 series · 7 of 7 positions shown · non-contrast
Comparison: Previous exam(s).

CLINICAL DATA: The patient had right breast surgery in [DATE] for breast cancer. Her first follow-up mammogram was [DATE]. The patient has had bloody and clear right nipple discharge
since that time.

EXAM:
DIGITAL DIAGNOSTIC UNILATERAL RIGHT MAMMOGRAM WITH TOMOSYNTHESIS AND
CAD; ULTRASOUND RIGHT BREAST LIMITED
TECHNIQUE: Right digital diagnostic mammography and breast tomosynthesis was
performed. The images were evaluated with computer-aided detection.;
Targeted ultrasound examination of the right breast was performed

[Series 1: us breast*right* limited inc axilla · 0.07mm/px · 7 of 7 slices shown]
[im 1/7]
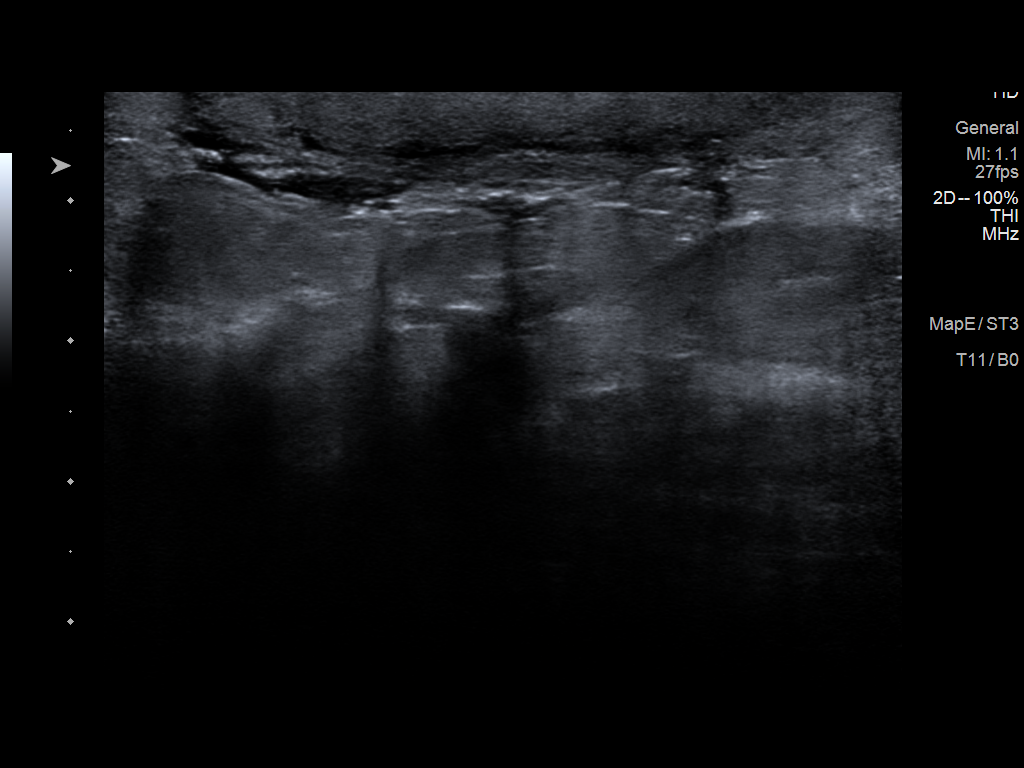
[im 2/7]
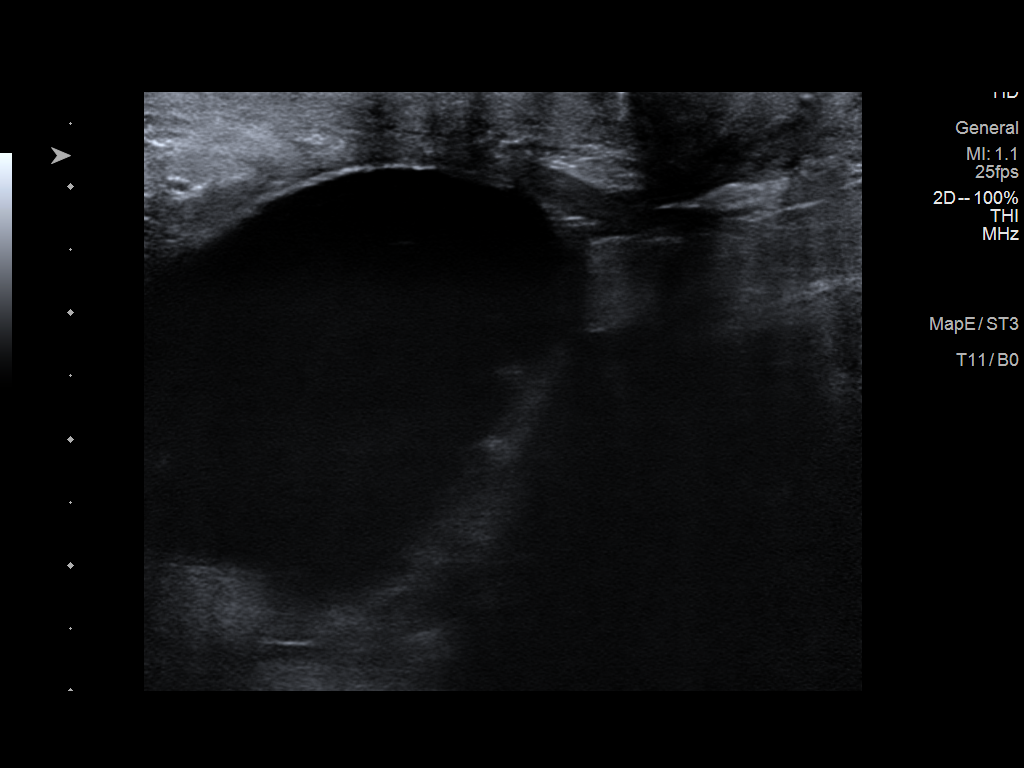
[im 3/7]
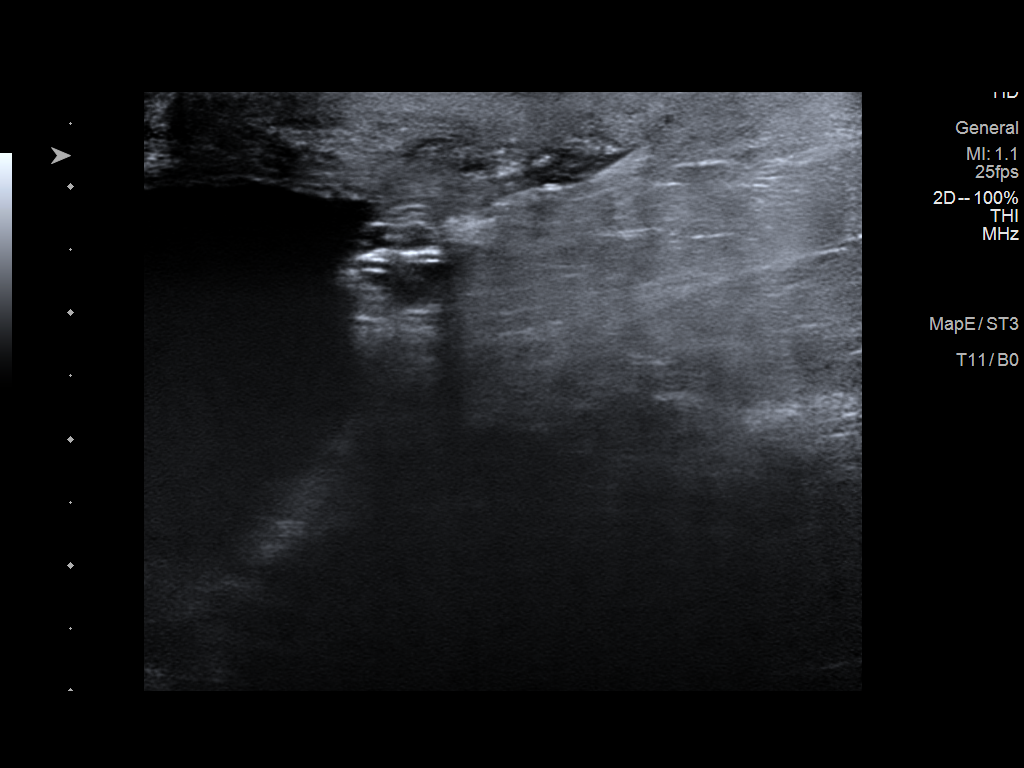
[im 4/7]
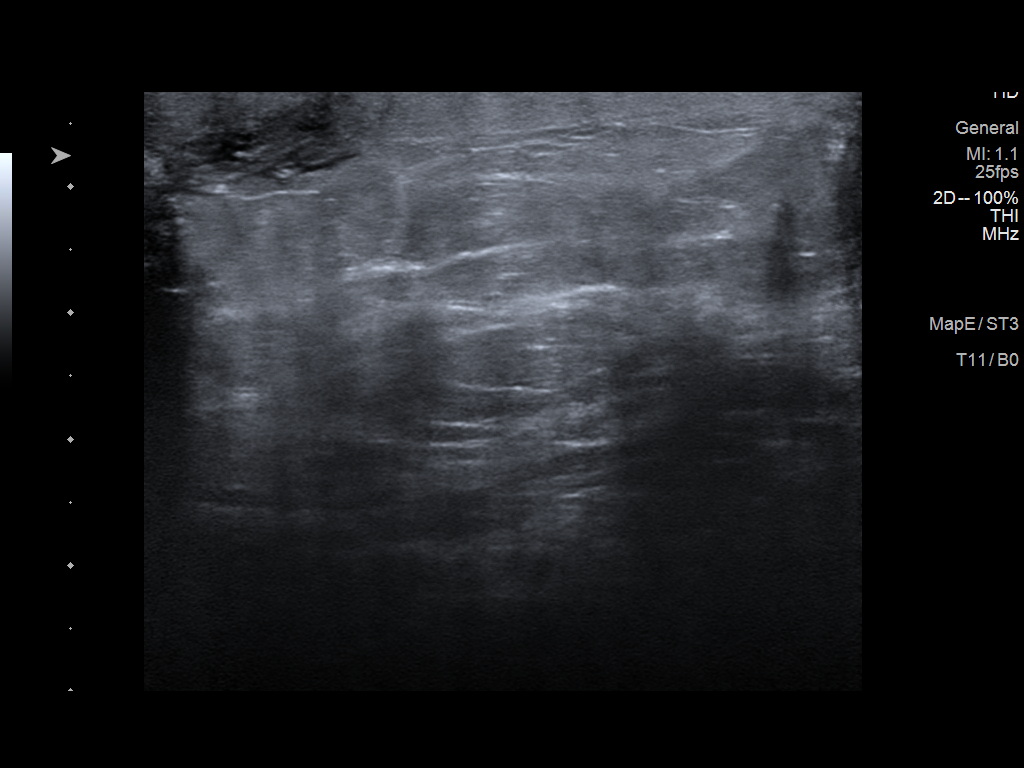
[im 5/7]
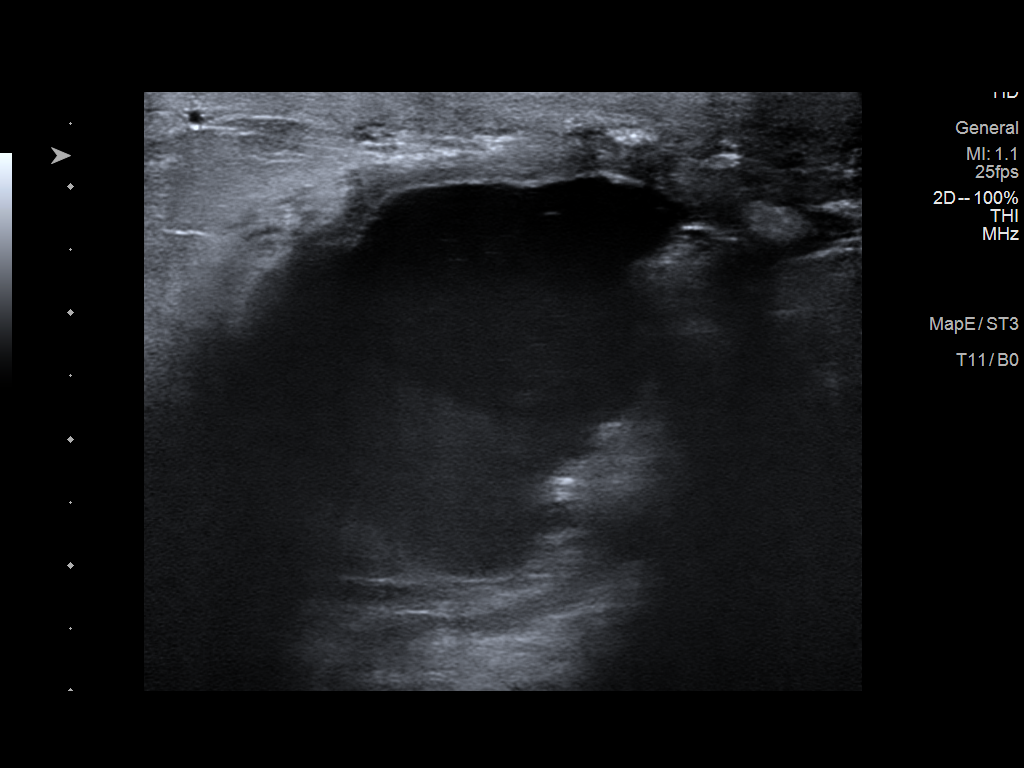
[im 6/7]
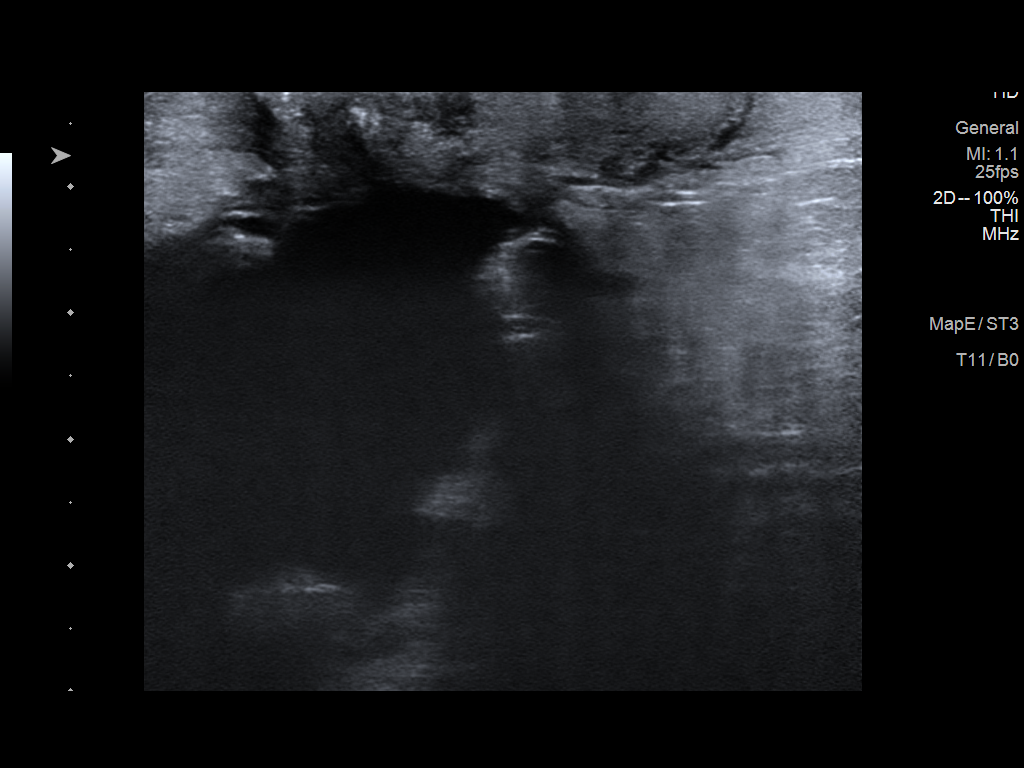
[im 7/7]
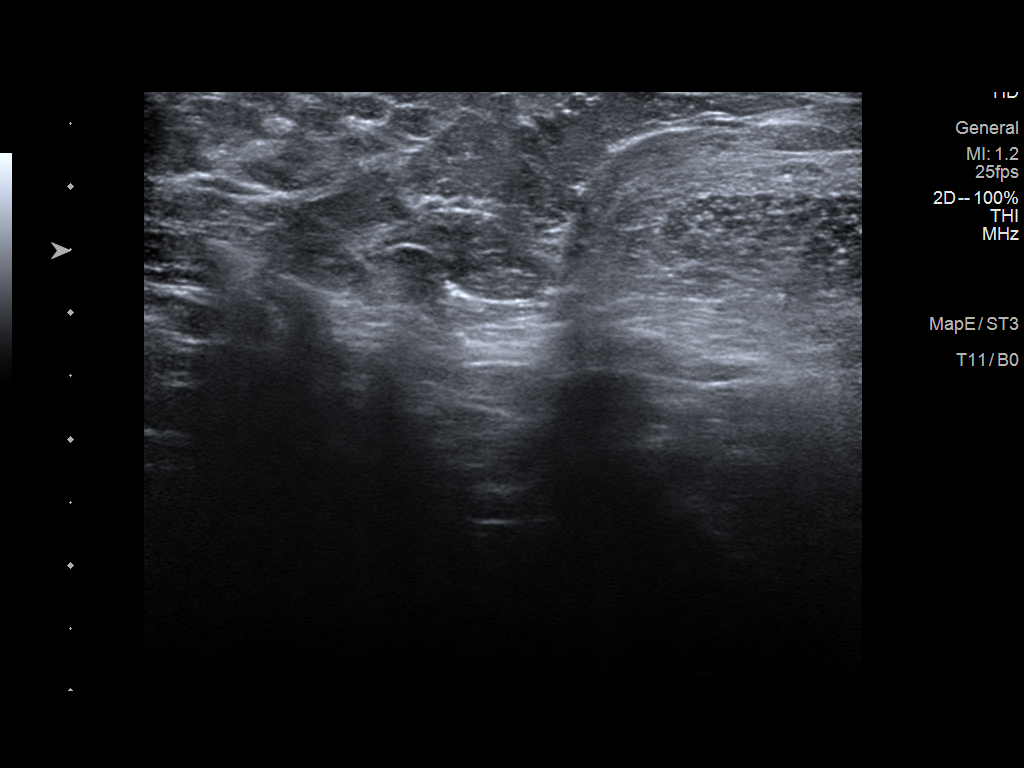

[7 of 7 positions shown; findings below may reference images not displayed]

ACR Breast Density Category b: There are scattered areas of
fibroglandular density.
FINDINGS: The patient's postoperative seroma is smaller compared to [DATE]. Postsurgical changes and post radiation changes are noted.
No other suspicious findings in the right breast.

On physical exam, nipple discharge is elicited when pressure was
placed on the seroma.

Targeted ultrasound is performed, showing the patient is
postoperative seroma with no other abnormalities.
IMPRESSION: No mammographic or sonographic evidence of malignancy.

I suspect the patient's nipple discharge is due to a connection
between the seroma and a milk duct. Underlying malignancy is
considered less likely.

RECOMMENDATION:
Recommend breast MRI to exclude other causes of nipple discharge.
The patient will then be sent to her surgeon after MRI results.

I have discussed the findings and recommendations with the patient.
If applicable, a reminder letter will be sent to the patient
regarding the next appointment.

BI-RADS CATEGORY  2: Benign.

## 2021-05-27 ENCOUNTER — Telehealth: Payer: Self-pay

## 2021-05-27 ENCOUNTER — Other Ambulatory Visit: Payer: Self-pay | Admitting: *Deleted

## 2021-05-27 DIAGNOSIS — N6452 Nipple discharge: Secondary | ICD-10-CM

## 2021-05-27 NOTE — Telephone Encounter (Signed)
-----   Message from Mora Bellman, MD sent at 05/23/2021 11:05 AM EST ----- The bloody discharge is not suggestive of a malignancy. Let's see what her breast imaging shows on 1/13

## 2021-05-27 NOTE — Telephone Encounter (Signed)
Called patient to give breast discharge results. Spoke with patient and patient's daughter. Informed patient that breast discharge showed no malignant cells. Based on patient's diagnostic mammo results a breast MRI was recommended for futher follow-up for breast discharge. Gave patient the scheduling # to schedule at [facility]. Patient and patient's daughter voiced understanding.

## 2021-06-03 ENCOUNTER — Ambulatory Visit (HOSPITAL_COMMUNITY)
Admission: RE | Admit: 2021-06-03 | Discharge: 2021-06-03 | Disposition: A | Discharge status: Home / Self Care | Payer: Self-pay | Source: Ambulatory Visit | Attending: Hematology and Oncology | Admitting: Hematology and Oncology

## 2021-06-03 ENCOUNTER — Other Ambulatory Visit: Payer: Self-pay

## 2021-06-03 DIAGNOSIS — N6452 Nipple discharge: Secondary | ICD-10-CM | POA: Insufficient documentation

## 2021-06-03 IMAGING — MR MR BREAST BILAT WO/W CM
7 of 10 series · 29 of 48 positions shown · IV contrast (9 GADAVIST)
Comparison: [DATE] mammogram, ultrasound and prior studies.
[DATE] MR.

CLINICAL DATA: 65-year-old female with history of RIGHT breast
cancer, RIGHT axillary lymph node metastasis with lumpectomy
([DATE]), chemotherapy and radiation treatment. Patient
complains of bloody RIGHT nipple discharge and nipple inversion
since [DATE]. Cytology of the RIGHT nipple discharge was
negative for malignancy.

EXAM:
BILATERAL BREAST MRI WITH AND WITHOUT CONTRAST
TECHNIQUE: Multiplanar, multisequence MR images of both breasts were obtained
prior to and following the intravenous administration of 9 ml of
Gadavist

[Series 2: T2 · axial · 3.0mm · 0.99mm/px · z∈[-53,+124]mm · 3 of 60 slices shown]
[im 1/60]
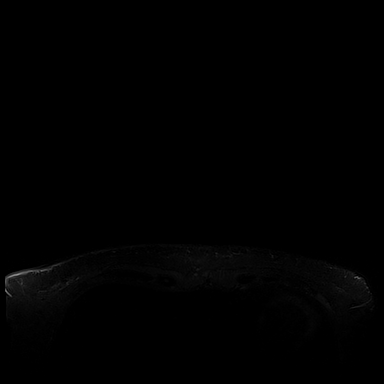
[im 30/60]
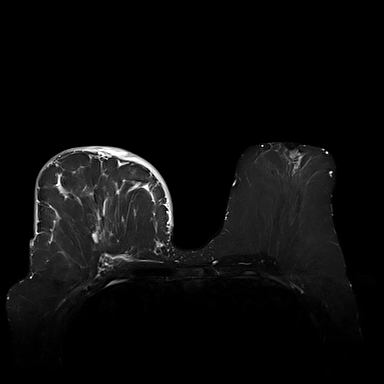
[im 60/60]
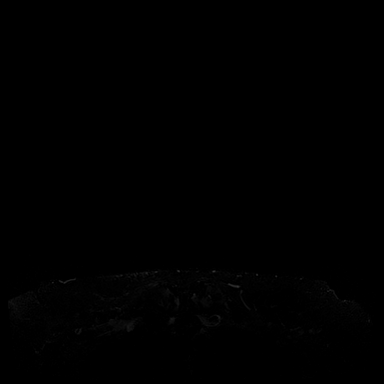

[Series 3: T1 fat-sat · axial · 1.2mm · 0.85mm/px · z∈[-60,+131]mm · 7 of 159 slices shown (1 of 3)]
[im 1/159]
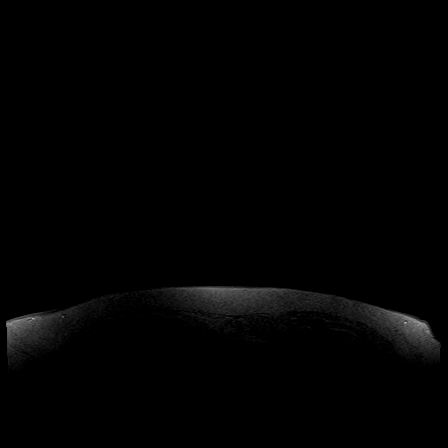
[im 27/159]
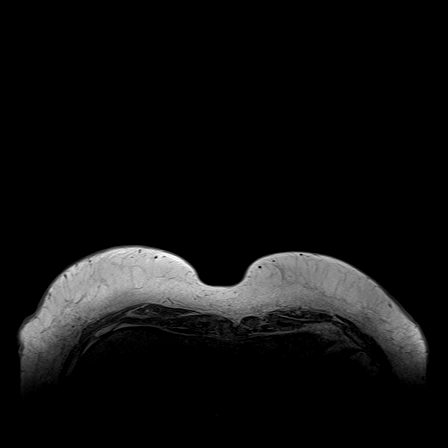
[im 53/159]
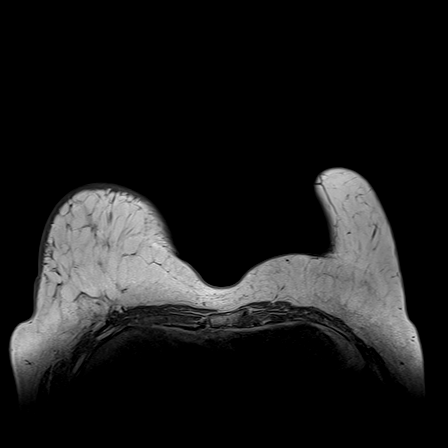
[im 80/159]
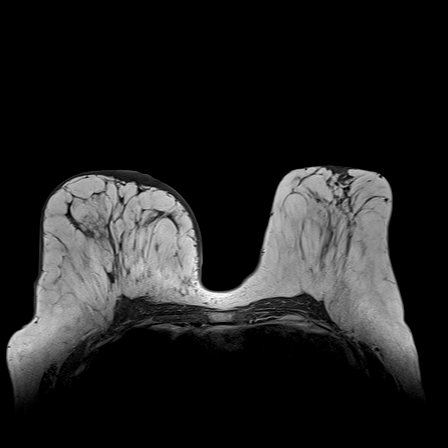
[im 106/159]
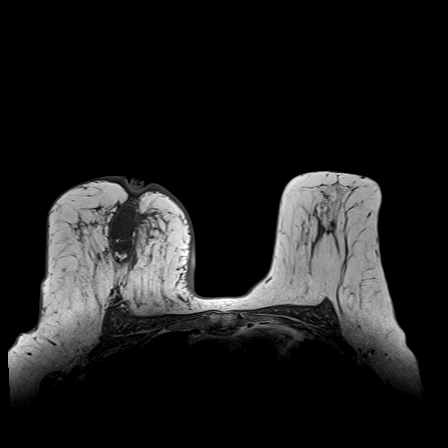
[im 132/159]
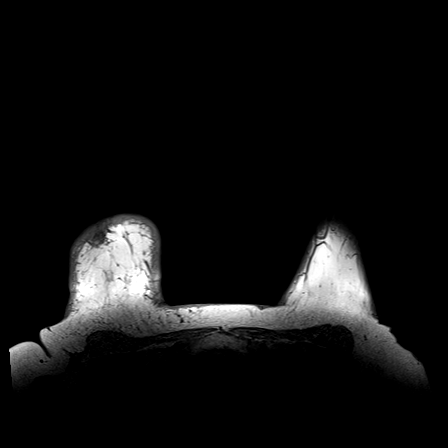
[im 159/159]
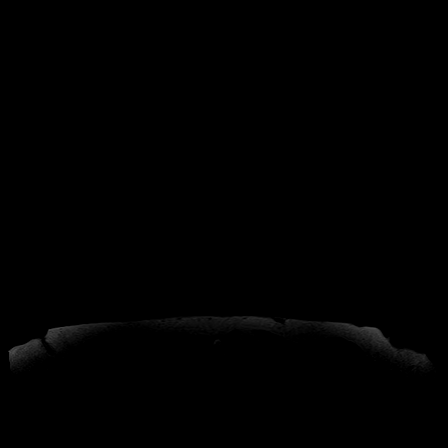

[Series 6: T1 fat-sat · axial · 1.2mm · 0.91mm/px · z∈[-51,+121]mm · 6 of 141 slices shown (2 of 3)]
[im 1/141]
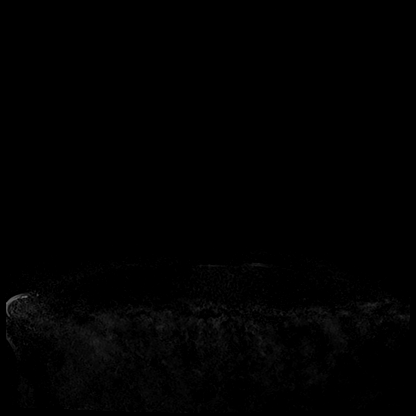
[im 29/141]
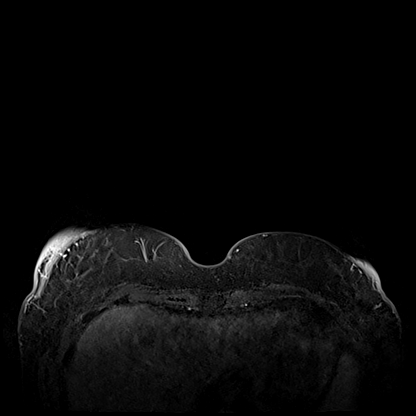
[im 57/141]
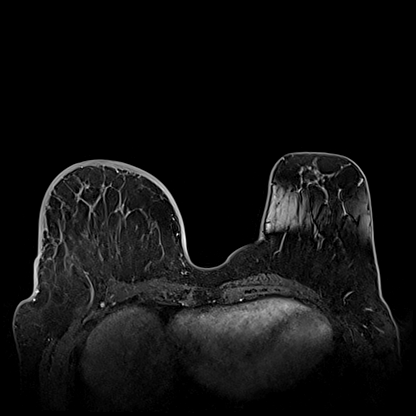
[im 85/141]
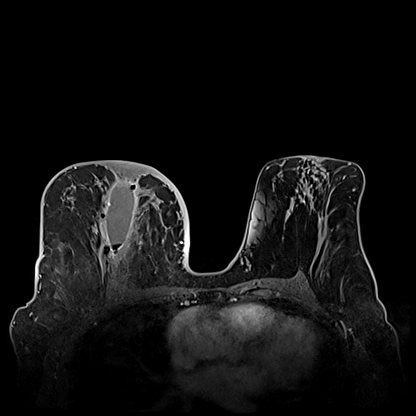
[im 113/141]
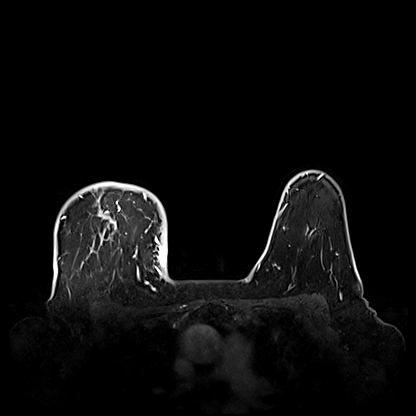
[im 141/141]
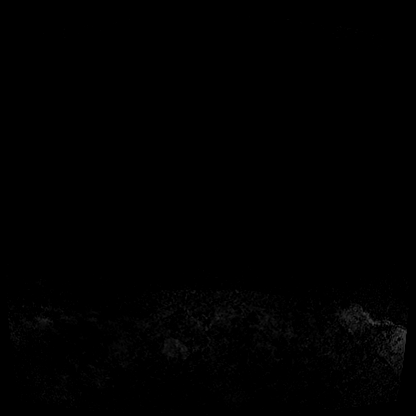

[Series 7: T1 · axial · 1.2mm · 0.91mm/px · z∈[-51,+121]mm · 6 of 144 slices shown (1 of 3)]
[im 1/144]
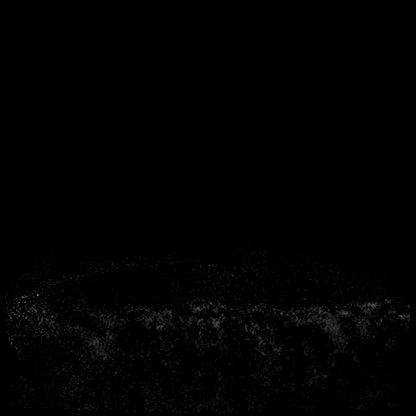
[im 29/144]
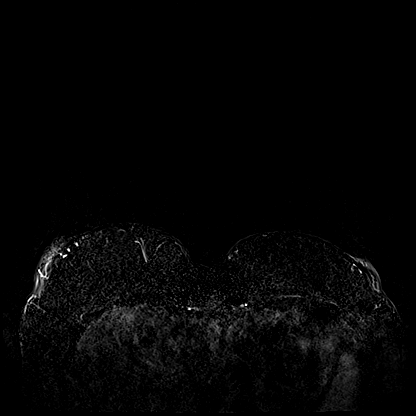
[im 58/144]
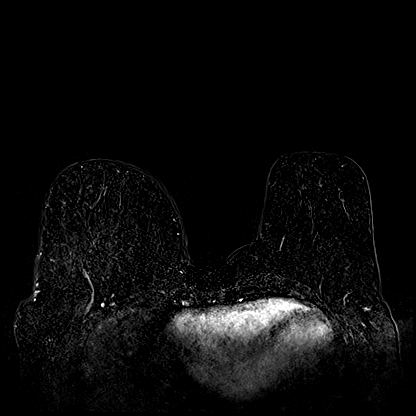
[im 86/144]
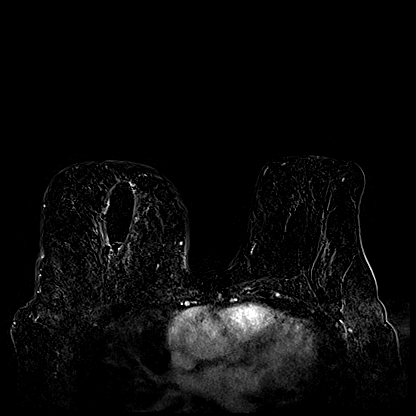
[im 115/144]
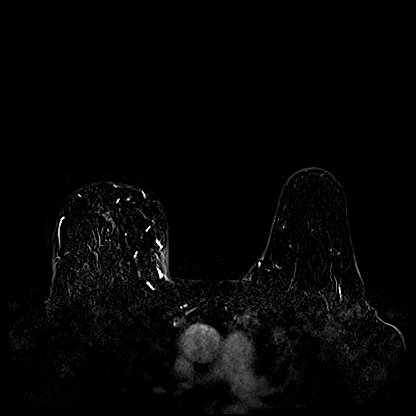
[im 144/144]
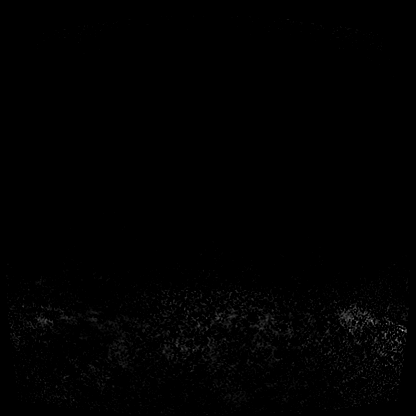

[Series 8: T1 · coronal · 380.0mm · 0.91mm/px · 1 of 3 slices shown (2 of 3)]
[im 1/3]
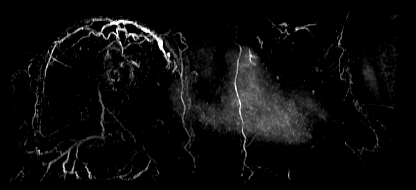

[Series 9: T1 · axial · 172.8mm · 0.91mm/px · 1 of 1 slices shown (3 of 3)]
[im 1/1]
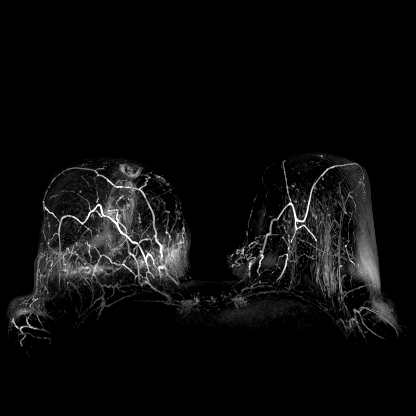

[Series 10: T1 fat-sat · axial · 1.2mm · 0.91mm/px · z∈[-51,+86]mm · 5 of 142 slices shown (3 of 3)]
[im 1/142]
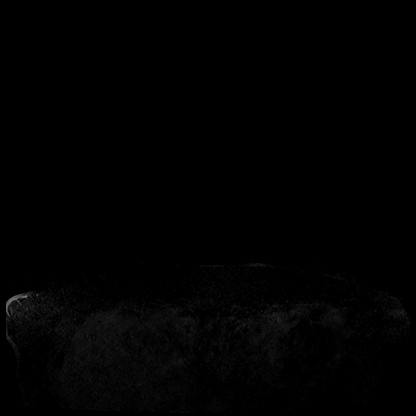
[im 29/142]
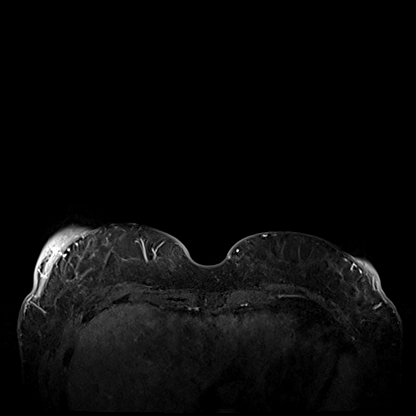
[im 57/142]
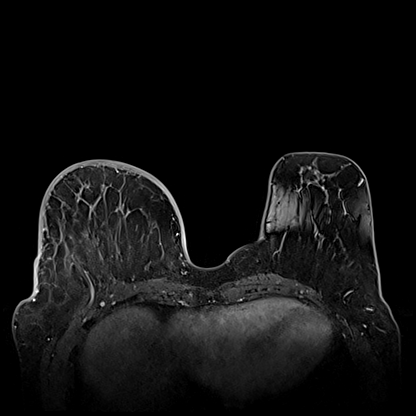
[im 85/142]
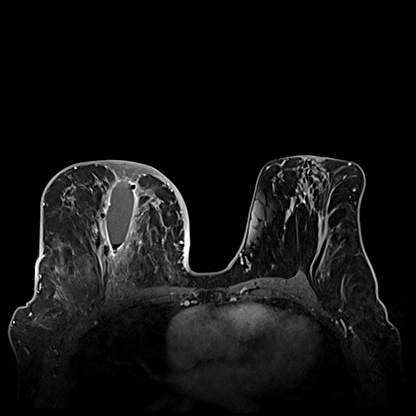
[im 113/142]
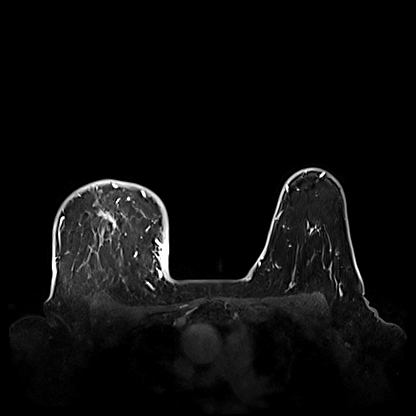

[29 of 48 positions shown; findings below may reference images not displayed]

Three-dimensional MR images were rendered by post-processing of the
original MR data on an independent workstation. The
three-dimensional MR images were interpreted, and findings are
reported in the following complete MRI report for this study. Three
dimensional images were evaluated at the independent interpreting
workstation using the DynaCAD thin client.
FINDINGS: Breast composition: b. Scattered fibroglandular tissue.

Background parenchymal enhancement: Minimal

Right breast: Lumpectomy changes within the UPPER central RIGHT
breast identified with a 2.5 x 6 cm collection at the lumpectomy
site. Differential fluid level with this collection is noted with
small amount of dependent proteinaceous or hemorrhagic material. The
anterior aspect of this collection lies just under the RIGHT nipple.

There are no findings suspicious for breast malignancy.

Radiation type changes of the RIGHT breast in skin are noted.

Left breast: No mass or abnormal enhancement.

Lymph nodes: No abnormal appearing lymph nodes. Surgical changes
within the RIGHT axilla are noted.

Ancillary findings:  None.
IMPRESSION: 1. No MR evidence of breast malignancy.
2. 2.5 x 6 cm RIGHT lumpectomy site postsurgical collection
(containing small amount of proteinaceous/hemorrhagic material) with
close proximity to the RIGHT nipple. The patient's RIGHT nipple
discharge is likely associated with this collection which probably
connects to the ductal system.
3. Postsurgical and treatment changes of the RIGHT breast and
axilla.

RECOMMENDATION:
Surgical/clinical follow-up of RIGHT nipple discharge.

Annual mammography in 9 months to resume annual mammogram schedule.

BI-RADS CATEGORY  2: Benign.

## 2021-06-03 MED ORDER — GADOBUTROL 1 MMOL/ML IV SOLN
9.0000 mL | Freq: Once | INTRAVENOUS | Status: AC | PRN
  Administered 2021-06-03: 9 mL via INTRAVENOUS

## 2021-06-27 ENCOUNTER — Ambulatory Visit (HOSPITAL_BASED_OUTPATIENT_CLINIC_OR_DEPARTMENT_OTHER)
Admission: RE | Admit: 2021-06-27 | Discharge: 2021-06-27 | Disposition: A | Discharge status: Home / Self Care | Payer: No Typology Code available for payment source | Source: Ambulatory Visit | Attending: Orthopaedic Surgery | Admitting: Orthopaedic Surgery

## 2021-06-27 ENCOUNTER — Ambulatory Visit (INDEPENDENT_AMBULATORY_CARE_PROVIDER_SITE_OTHER): Payer: Self-pay | Admitting: Orthopaedic Surgery

## 2021-06-27 ENCOUNTER — Other Ambulatory Visit: Payer: Self-pay

## 2021-06-27 ENCOUNTER — Other Ambulatory Visit (HOSPITAL_BASED_OUTPATIENT_CLINIC_OR_DEPARTMENT_OTHER): Payer: Self-pay | Admitting: Orthopaedic Surgery

## 2021-06-27 DIAGNOSIS — M1712 Unilateral primary osteoarthritis, left knee: Secondary | ICD-10-CM

## 2021-06-27 DIAGNOSIS — M1711 Unilateral primary osteoarthritis, right knee: Secondary | ICD-10-CM | POA: Insufficient documentation

## 2021-06-27 DIAGNOSIS — M25559 Pain in unspecified hip: Secondary | ICD-10-CM | POA: Insufficient documentation

## 2021-06-27 DIAGNOSIS — G8929 Other chronic pain: Secondary | ICD-10-CM

## 2021-06-27 DIAGNOSIS — M545 Low back pain, unspecified: Secondary | ICD-10-CM

## 2021-06-27 DIAGNOSIS — M25552 Pain in left hip: Secondary | ICD-10-CM

## 2021-06-27 IMAGING — DX DG KNEE COMPLETE 4+V*L*
4 series · 4 of 4 positions shown · non-contrast
Comparison: None.

CLINICAL DATA: Bilateral knee pain.

EXAM:
RIGHT KNEE - COMPLETE 4+ VIEW; LEFT KNEE - COMPLETE 4+ VIEW

[knee ap]
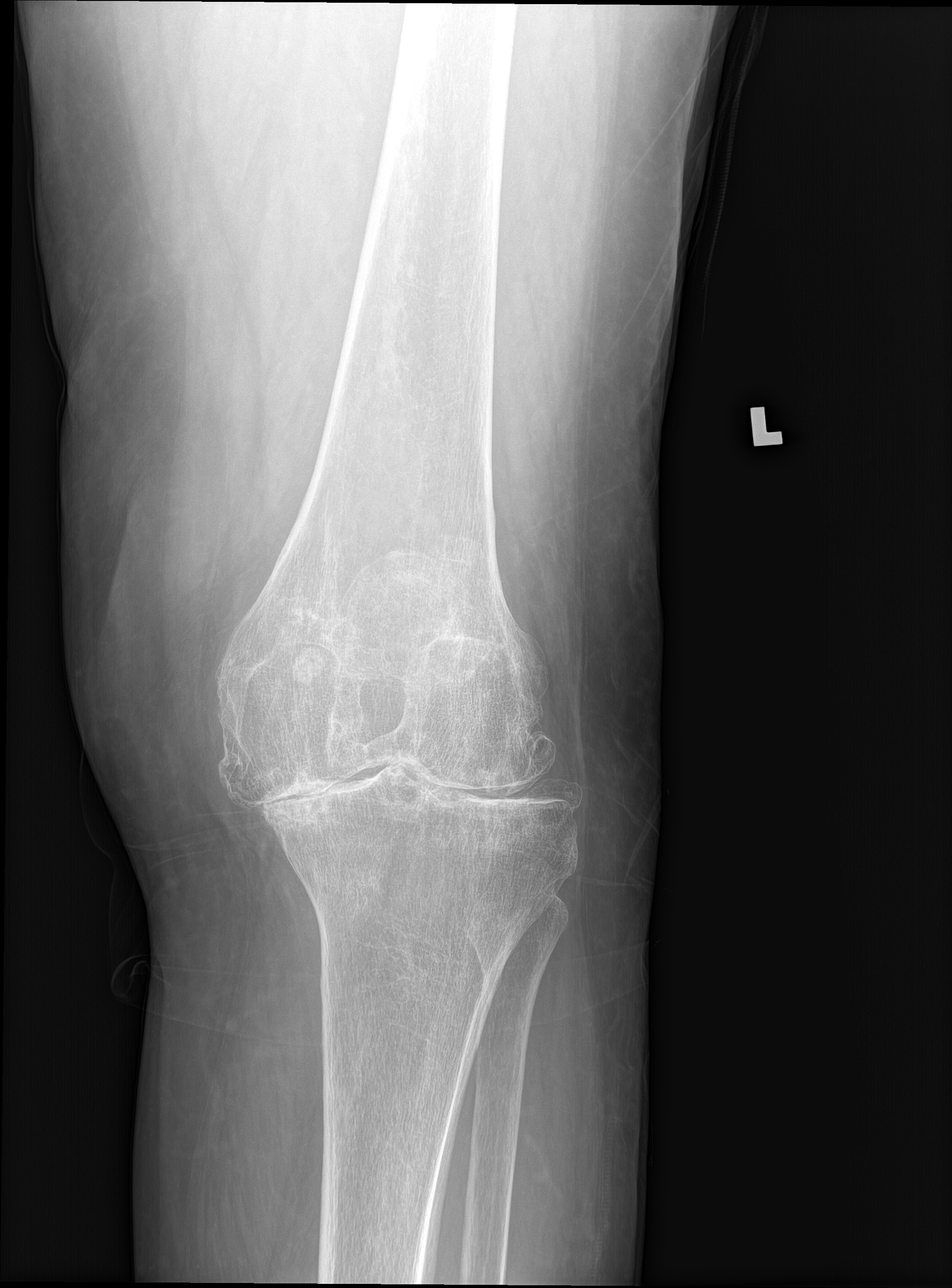

[knee lat]
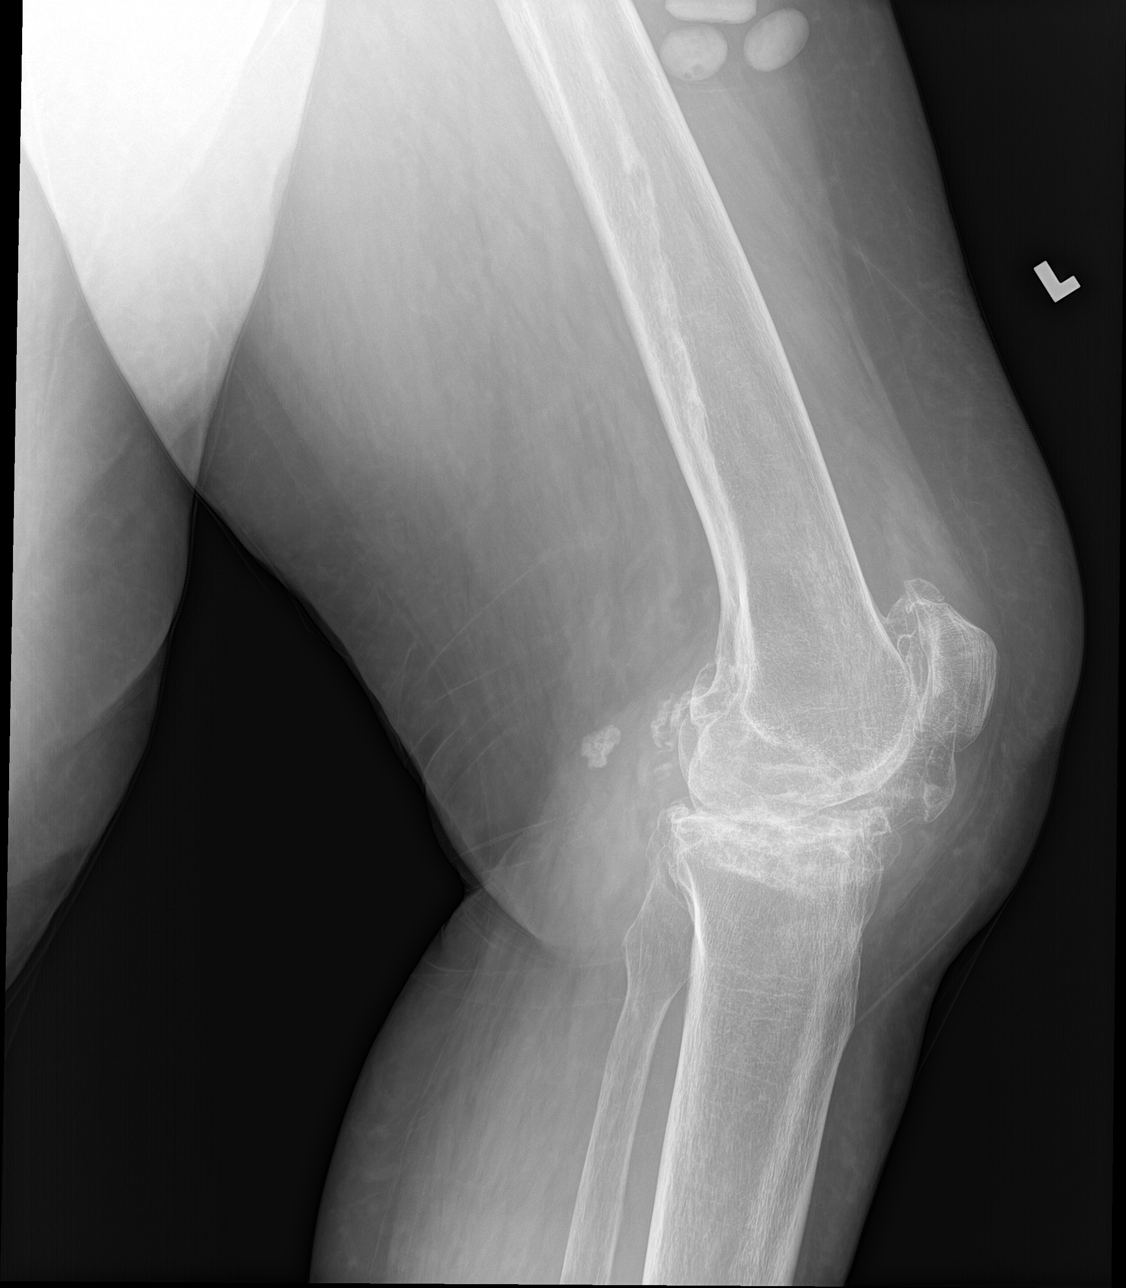

[patella skyline]
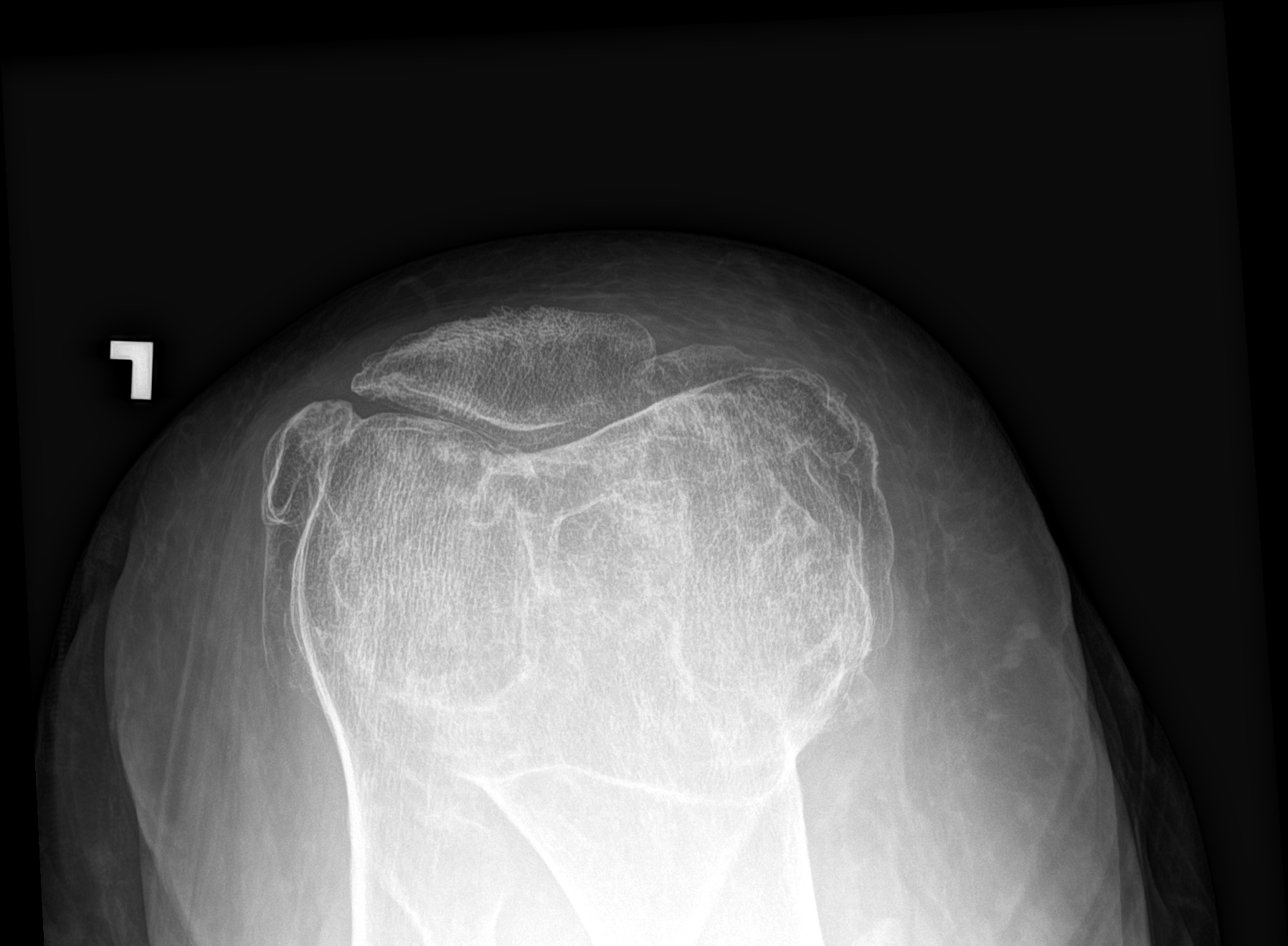

[knee tunnel]
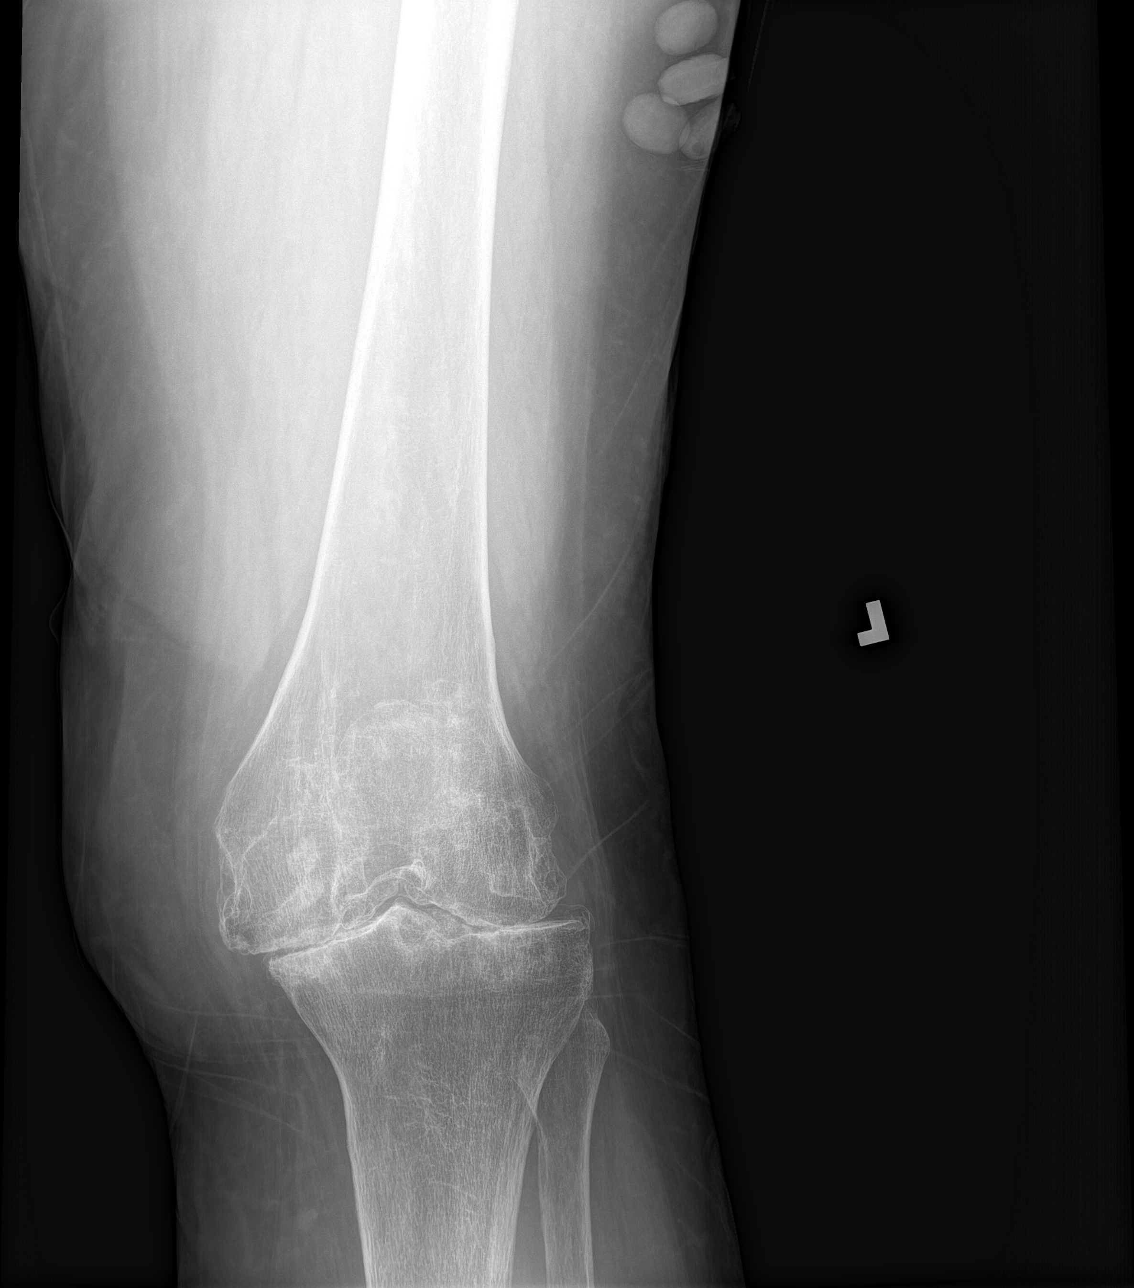

[4 of 4 positions shown; findings below may reference images not displayed]

FINDINGS: Right knee:

Diffuse decreased bone mineralization. Medial compartment
bone-on-bone contact with mild-to-moderate likely chronic medial
tibial plateau concavity. Moderate to high-grade medial and lateral
compartment degenerative osteophytes. Mild lateral compartment joint
space narrowing. Severe patellofemoral joint space narrowing with
bone-on-bone contact and large peripheral degenerative osteophytes.
Multiple chronic mineralized loose bodies are seen measuring up to
approximally 2.3 cm within the superolateral aspect of the
suprapatellar joint pouch. Tiny joint effusion. No acute fracture or
dislocation.

Left knee:

Diffuse decreased bone mineralization. Medial compartment joint
space narrowing with bone on bone contact subchondral sclerosis, and
subchondral degenerative cystic changes. Moderate peripheral lateral
compartment degenerative osteophytosis with moderate joint space
narrowing. Severe patellofemoral joint space narrowing with
bone-on-bone contact and large peripheral degenerative osteophytes.
13 mm mineralized density overlying the posterior knee soft tissues,
possibly within the joint space or a Baker's cyst. No joint
effusion.
IMPRESSION: Bilateral knee severe medial and patellofemoral compartment
osteoarthritis. Multiple right suprapatellar loose bodies.

## 2021-06-27 IMAGING — DX DG KNEE COMPLETE 4+V*R*
4 series · 4 of 4 positions shown · non-contrast
Comparison: None.

CLINICAL DATA: Bilateral knee pain.

EXAM:
RIGHT KNEE - COMPLETE 4+ VIEW; LEFT KNEE - COMPLETE 4+ VIEW

[knee ap]
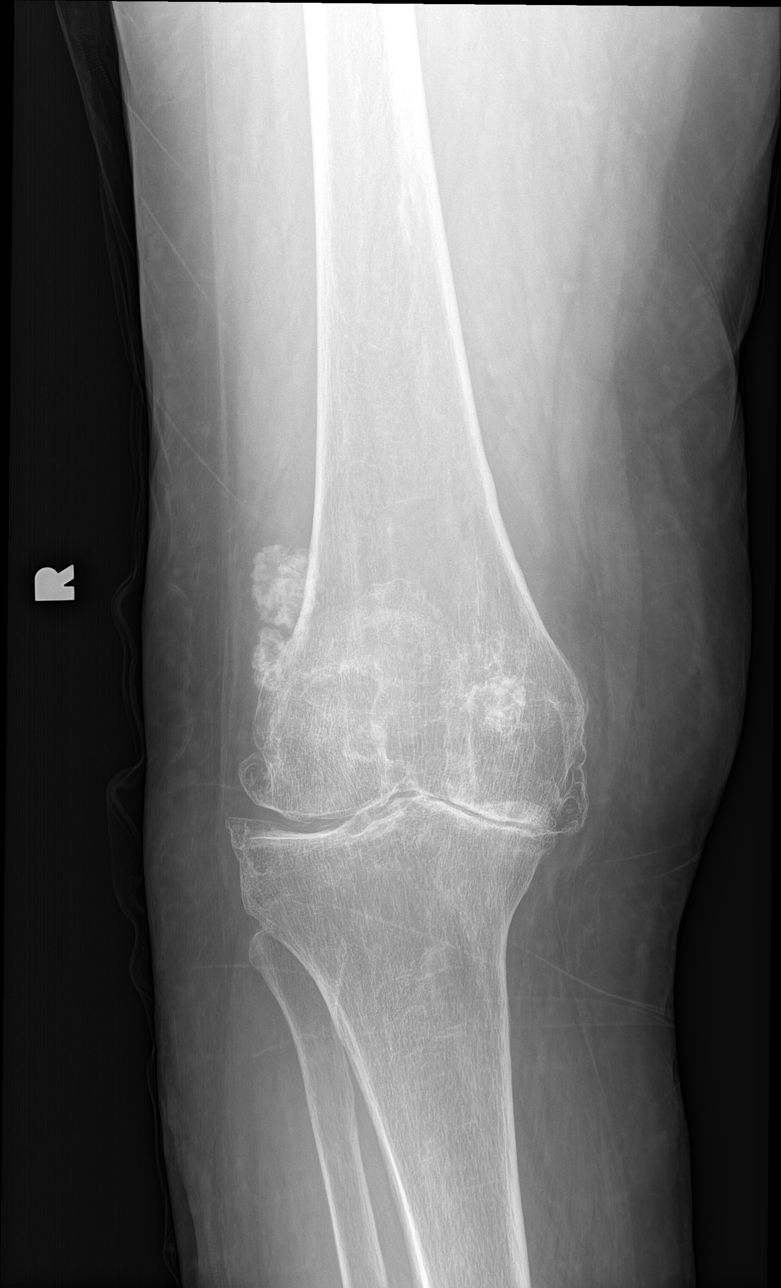

[knee lat]
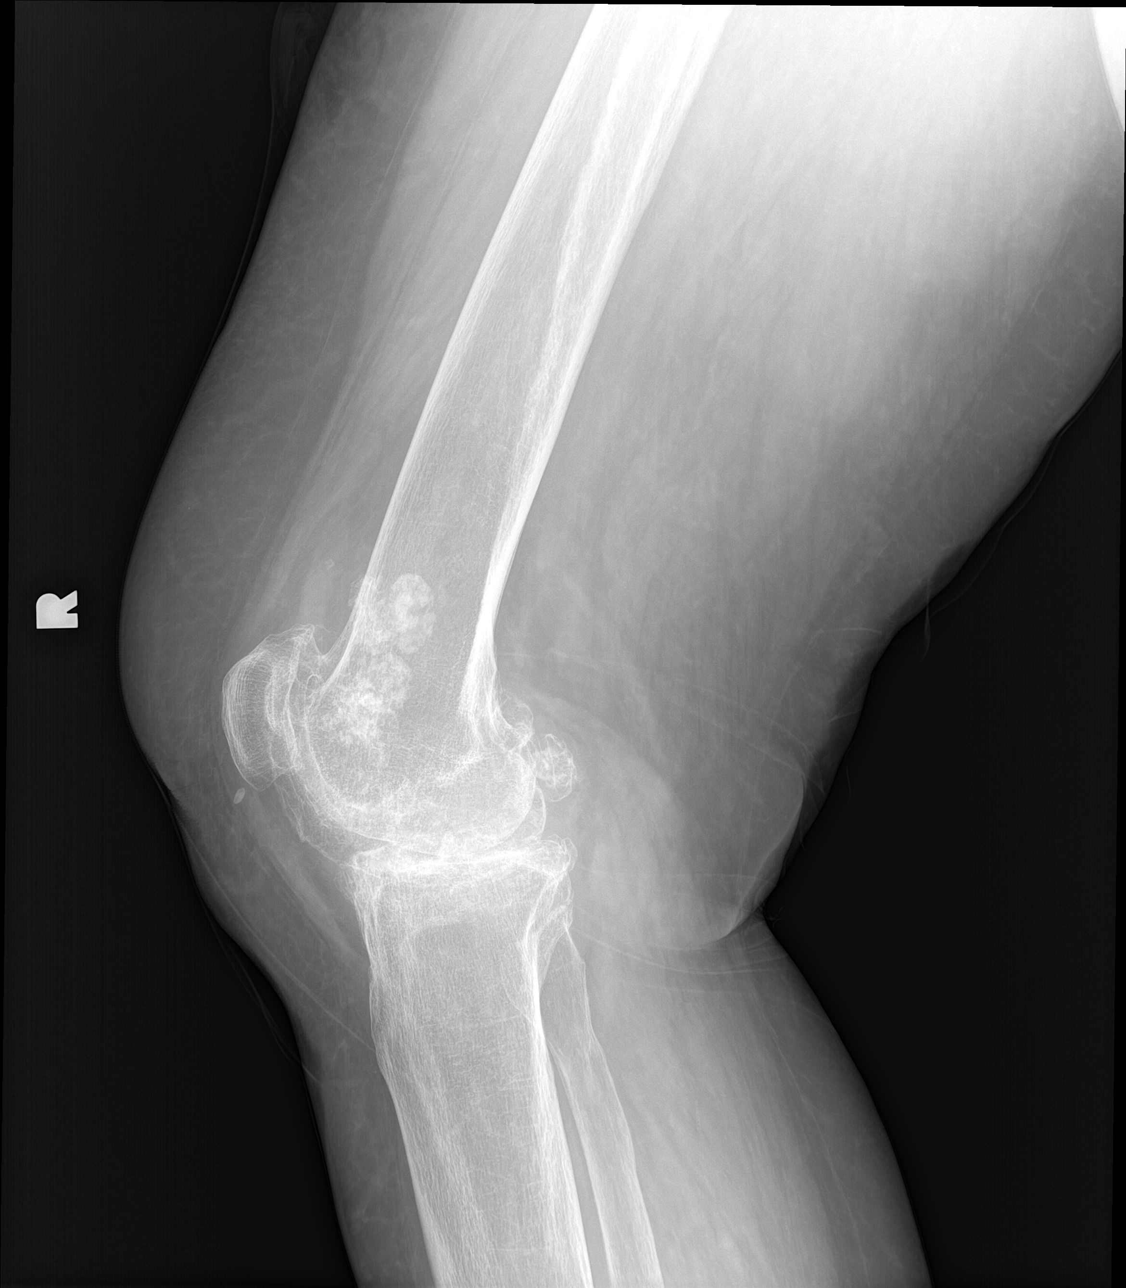

[patella skyline]
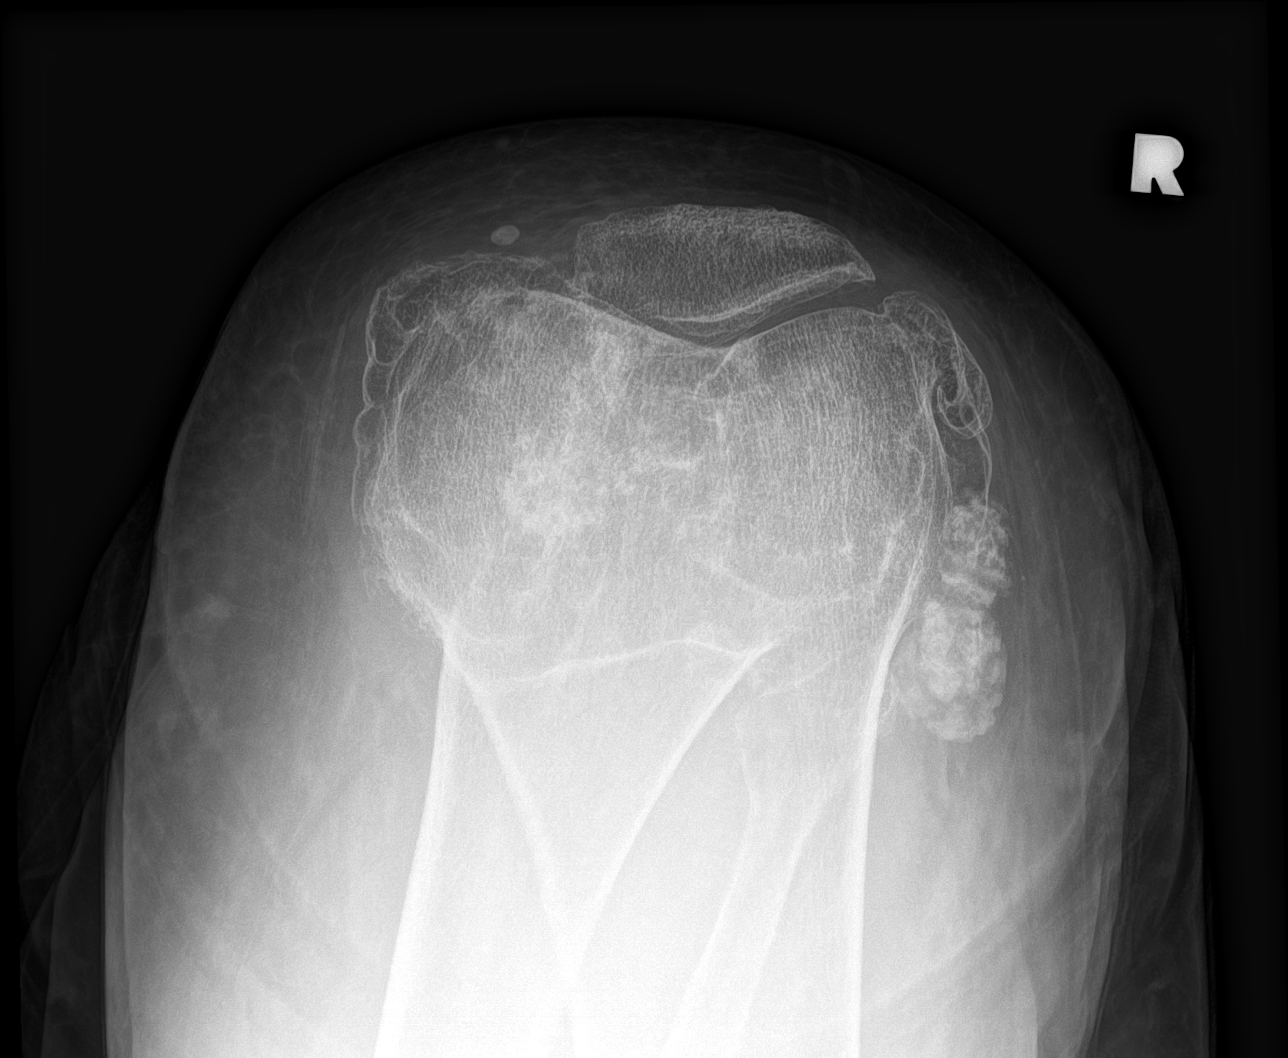

[knee tunnel]
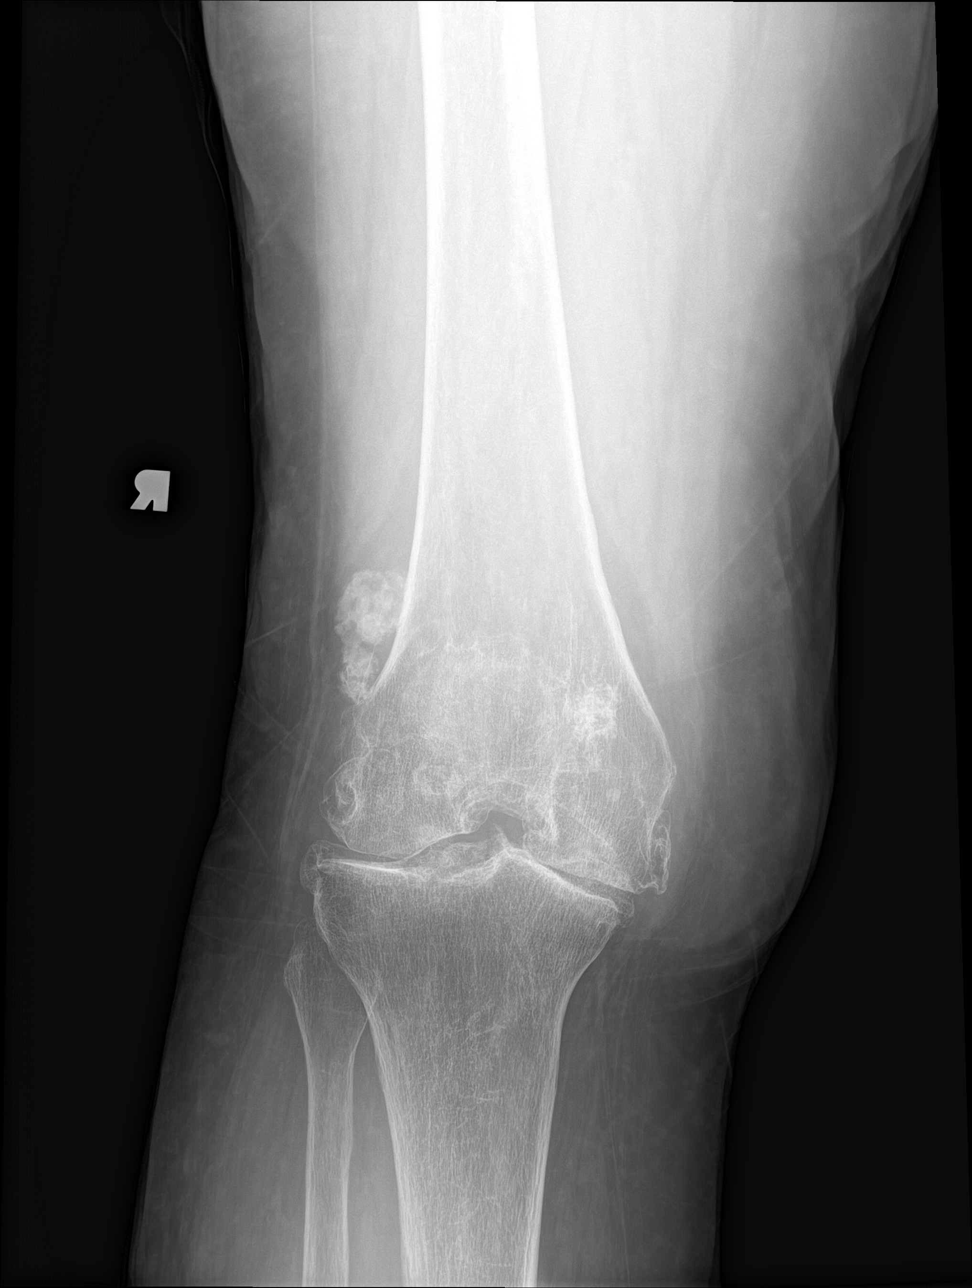

[4 of 4 positions shown; findings below may reference images not displayed]

FINDINGS: Right knee:

Diffuse decreased bone mineralization. Medial compartment
bone-on-bone contact with mild-to-moderate likely chronic medial
tibial plateau concavity. Moderate to high-grade medial and lateral
compartment degenerative osteophytes. Mild lateral compartment joint
space narrowing. Severe patellofemoral joint space narrowing with
bone-on-bone contact and large peripheral degenerative osteophytes.
Multiple chronic mineralized loose bodies are seen measuring up to
approximally 2.3 cm within the superolateral aspect of the
suprapatellar joint pouch. Tiny joint effusion. No acute fracture or
dislocation.

Left knee:

Diffuse decreased bone mineralization. Medial compartment joint
space narrowing with bone on bone contact subchondral sclerosis, and
subchondral degenerative cystic changes. Moderate peripheral lateral
compartment degenerative osteophytosis with moderate joint space
narrowing. Severe patellofemoral joint space narrowing with
bone-on-bone contact and large peripheral degenerative osteophytes.
13 mm mineralized density overlying the posterior knee soft tissues,
possibly within the joint space or a Baker's cyst. No joint
effusion.
IMPRESSION: Bilateral knee severe medial and patellofemoral compartment
osteoarthritis. Multiple right suprapatellar loose bodies.

## 2021-06-27 NOTE — Progress Notes (Signed)
Chief Complaint: Bilateral knee pain right leg pain     History of Present Illness:    [name] [name] is a 66 y.o. female presents today with pain that has been radiating down the right leg.  This has been going on for several months.  This is interfering with her ability to sit and stand.  She is taking meloxicam without significant relief.  She has had injections multiple steroid injections into both knees previously were in the [place].  These were done many years prior.  He states that she is no longer getting any relief from these injections.  Here today for further discussion of her radiating pain as well as bilateral knee pain.  She walks with a cane at baseline.    Surgical History:   None  PMH/PSH/Family History/Social History/Meds/Allergies:    Past Medical History:  Diagnosis Date   Arthritis    Cancer ([place])    breast   Diabetes mellitus without complication ([place])    Hypertension    Personal history of chemotherapy    Personal history of radiation therapy    Past Surgical History:  Procedure Laterality Date   BREAST BIOPSY Right 02/03/2020   x2   BREAST BIOPSY Right 02/06/2020   x2   BREAST LUMPECTOMY Right 07/26/2020   BREAST LUMPECTOMY WITH RADIOACTIVE SEED AND SENTINEL LYMPH NODE BIOPSY Right 07/26/2020   Procedure: RIGHT BREAST LUMPECTOMY WITH RADIOACTIVE SEED X 2 AND SENTINEL LYMPH NODE BIOPSY;  Surgeon: Erroll Luna, MD;  Location: [facility];  Service: General;  Laterality: Right;   IR IMAGING GUIDED PORT INSERTION  02/23/2020   PORT-A-CATH REMOVAL N/A 07/26/2020   Procedure: REMOVAL PORT-A-CATH;  Surgeon: Erroll Luna, MD;  Location: [facility];  Service: General;  Laterality: N/A;   Social History   Socioeconomic History   Marital status: Single    Spouse name: Not on file   Number of children: 1   Years of education: Not on file   Highest education level: 9th grade  Occupational  History   Not on file  Tobacco Use   Smoking status: Never   Smokeless tobacco: Never  Vaping Use   Vaping Use: Never used  Substance and Sexual Activity   Alcohol use: Yes    Alcohol/week: 1.0 standard drink    Types: 1 Glasses of wine per week    Comment: occasionally   Drug use: Never   Sexual activity: Not Currently  Other Topics Concern   Not on file  Social History Narrative   Not on file   Social Determinants of Health   Financial Resource Strain: Not on file  Food Insecurity: No Food Insecurity   Worried About Running Out of Food in the Last Year: Never true   Ran Out of Food in the Last Year: Never true  Transportation Needs: No Transportation Needs   Lack of Transportation (Medical): No   Lack of Transportation (Non-Medical): No  Physical Activity: Not on file  Stress: Not on file  Social Connections: Not on file   Family History  Problem Relation Age of Onset   Asthma Mother    Arthritis Sister    Asthma Brother    Arthritis Brother    Allergies  Allergen Reactions   Sulfa Antibiotics Itching   Current Outpatient Medications  Medication  Sig Dispense Refill   acetaminophen (TYLENOL) 500 MG tablet Take 500 mg by mouth 3 (three) times daily as needed.     amLODipine (NORVASC) 5 MG tablet Take 5 mg by mouth daily.     anastrozole (ARIMIDEX) 1 MG tablet Take 1 tablet (1 mg total) by mouth daily. 90 tablet 4   atorvastatin (LIPITOR) 40 MG tablet Take 40 mg by mouth daily.     carvedilol (COREG) 6.25 MG tablet Take 6.25 mg by mouth 2 (two) times daily with a meal.     ibuprofen (ADVIL) 800 MG tablet Take 1 tablet (800 mg total) by mouth every 8 (eight) hours as needed. 30 tablet 0   lansoprazole (PREVACID) 30 MG capsule Take 30 mg by mouth every morning.     losartan (COZAAR) 100 MG tablet Take 100 mg by mouth daily.     metFORMIN (GLUCOPHAGE) 1000 MG tablet Take 1,000 mg by mouth daily with breakfast.      No current facility-administered medications for  this visit.   No results found.  Review of Systems:   A ROS was performed including pertinent positives and negatives as documented in the HPI.  Physical Exam :   Constitutional: NAD and appears stated age Neurological: Alert and oriented Psych: Appropriate affect and cooperative There were no vitals taken for this visit.   Comprehensive Musculoskeletal Exam:      Musculoskeletal Exam  Gait Normal  Alignment Normal   Right Left  Inspection Normal Normal  Palpation    Tenderness Tricompartmental Tricompartmental  Crepitus Positive Positive  Effusion Trace Trace  Range of Motion    Extension 0 0  Flexion 120 120  Strength    Extension 5/5 5/5  Flexion 5/5 5/5  Ligament Exam     Generalized Laxity No No  Lachman Negative Negative   Pivot Shift Negative Negative  Anterior Drawer Negative Negative  Valgus at 0 Negative Negative  Valgus at 20 Negative Negative  Varus at 0 0 0  Varus at 20   0 0  Posterior Drawer at 90 0 0  Vascular/Lymphatic Exam    Edema None None  Venous Stasis Changes No No  Distal Circulation Normal Normal  Neurologic    Light Touch Sensation Intact Intact  Special Tests:      Imaging:   Xray (x-rays right knee 4 views, x-rays left knee 4 views, AP pelvis, x-ray right hip 2 views, x-ray left hip 2 views): Severe tricompartmental osteoarthritis of both knees.  Bilateral hips are well preserved without significant arthritis.   I personally reviewed and interpreted the radiographs.   Assessment:   66 year old female very pleasant with pain radiating down the lower back into the right leg which is consistent with a lumbar radiculitis.  This has been going on for several years and is failed conservative management with anti-inflammatories.  At this point I do believe an MRI is indicated in order to rule out any type of lumbar disc herniation that would potentially need to be intervened upon.  Specifically I do believe that this would be important  for planning specifically with regard to her knees.  She is considering knee arthroplasty at this time although this has been difficult due to insurance reasons.  We will plan to connect her with financial aid to discuss specific pricing for knee arthroplasty.  Plan :    -Plan for MRI lumbar spine and follow-up to discuss results  I believe that advance imaging in the form of an MRI  is indicated for the following reasons: -Xrays images were obtained and not diagnostic -The patient has failed treatment modalities including rest, NSAIDs, activity restriction -The following worrisome symptoms are present on history and exam: Radicular type pain radiating down both legs its been persistent for 2 years - MRI is required to assist in specific surgical planning       I personally saw and evaluated the patient, and participated in the management and treatment plan.  Vanetta Mulders, MD Attending Physician, Orthopedic Surgery  This document was dictated using Dragon voice recognition software. A reasonable attempt at proof reading has been made to minimize errors.

## 2021-07-15 ENCOUNTER — Encounter: Payer: Self-pay | Admitting: Oncology

## 2021-07-15 ENCOUNTER — Other Ambulatory Visit: Payer: Self-pay

## 2021-07-15 ENCOUNTER — Ambulatory Visit
Admission: RE | Admit: 2021-07-15 | Discharge: 2021-07-15 | Disposition: A | Discharge status: Home / Self Care | Payer: No Typology Code available for payment source | Source: Ambulatory Visit | Attending: Orthopaedic Surgery | Admitting: Orthopaedic Surgery

## 2021-07-15 DIAGNOSIS — G8929 Other chronic pain: Secondary | ICD-10-CM

## 2021-07-15 IMAGING — MR MR LUMBAR SPINE W/O CM
4 of 5 series · 25 of 48 positions shown · non-contrast
Comparison: None available.

CLINICAL DATA: Initial evaluation for chronic bilateral lower back
pain with radiation to the groin and bilateral lower extremities of
the with associated weakness and numbness. Remote history of breast
cancer.

EXAM:
MRI LUMBAR SPINE WITHOUT CONTRAST
TECHNIQUE: Multiplanar, multisequence MR imaging of the lumbar spine was
performed. No intravenous contrast was administered.

[Series 3: T2 · sagittal · 4.0mm · 1.17mm/px · 6 of 17 slices shown (1 of 2)]
[im 1/17]
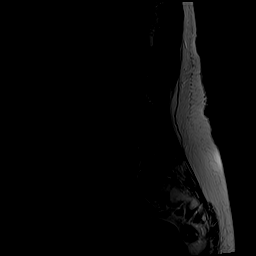
[im 4/17]
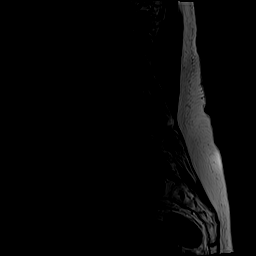
[im 7/17]
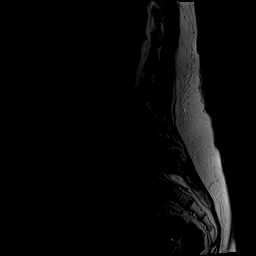
[im 10/17]
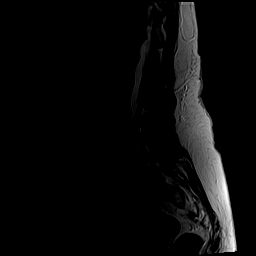
[im 13/17]
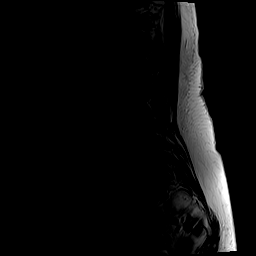
[im 17/17]
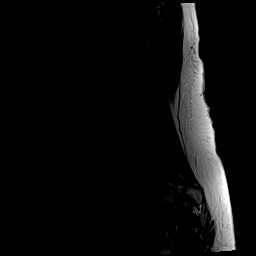

[Series 5: T1 · sagittal · 4.0mm · 1.17mm/px · 6 of 17 slices shown (1 of 2)]
[im 1/17]
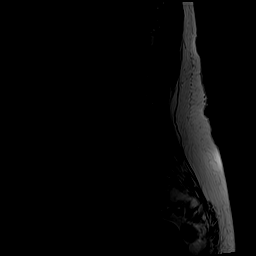
[im 4/17]
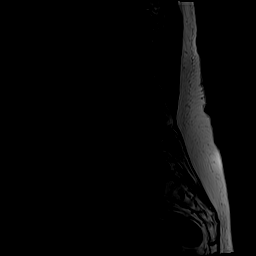
[im 7/17]
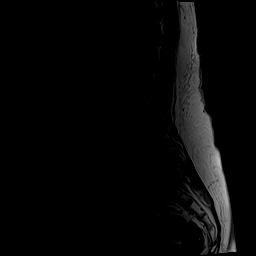
[im 10/17]
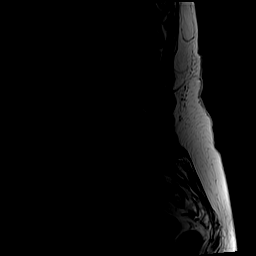
[im 13/17]
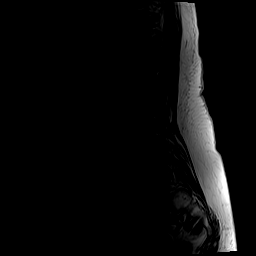
[im 17/17]
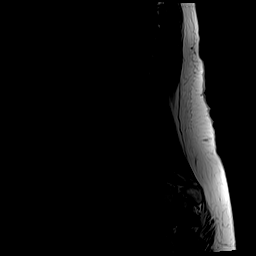

[Series 6: T2 · axial · 4.0mm · 0.39mm/px · z∈[-43,+169]mm · 9 of 40 slices shown (2 of 2)]
[im 1/40]
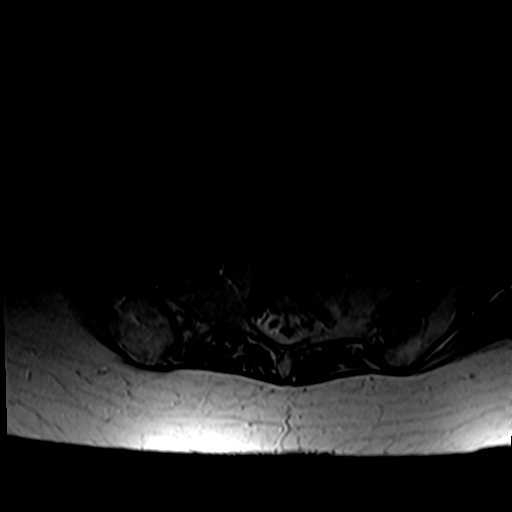
[im 6/40]
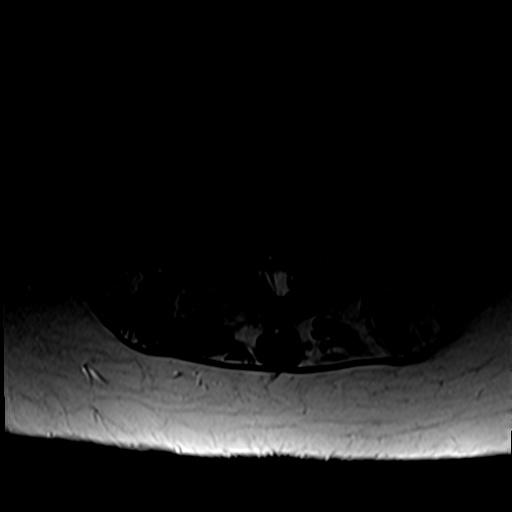
[im 12/40]
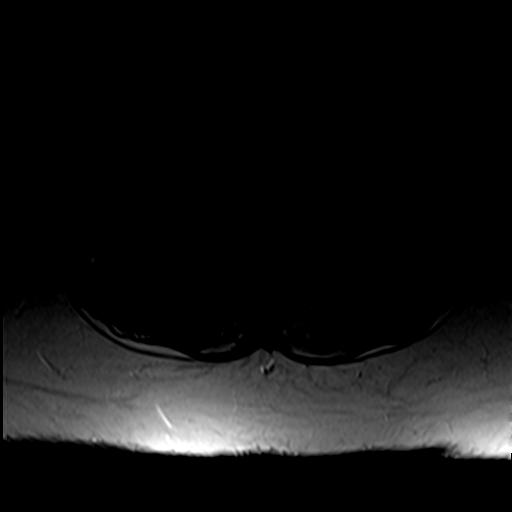
[im 17/40]
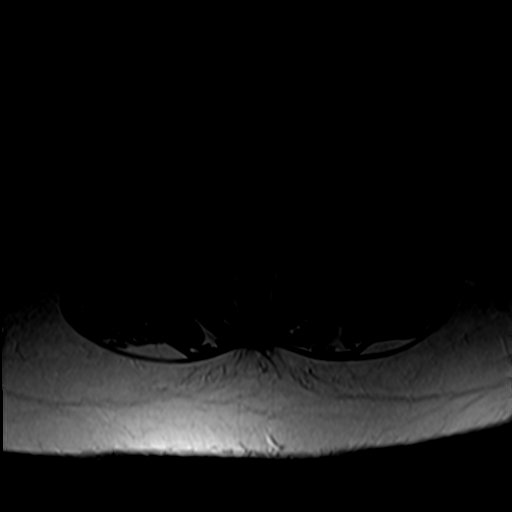
[im 20/40]
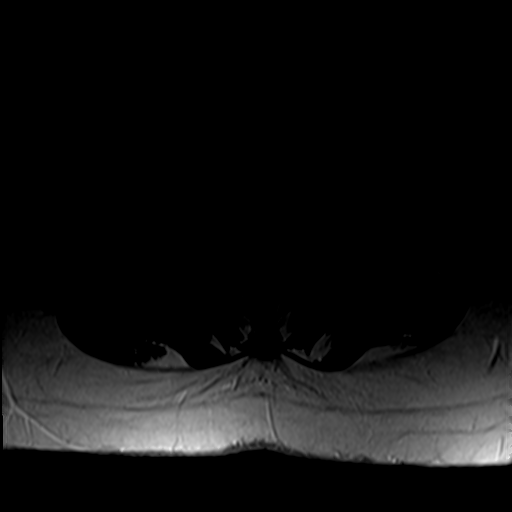
[im 23/40]
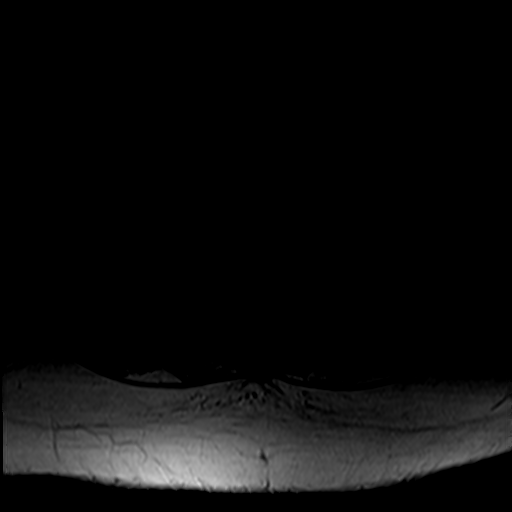
[im 28/40]
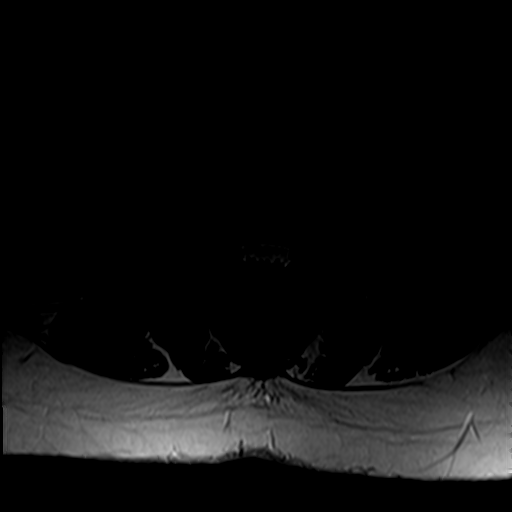
[im 34/40]
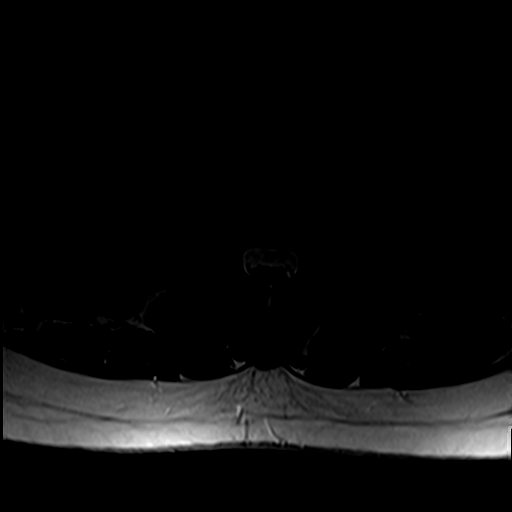
[im 40/40]
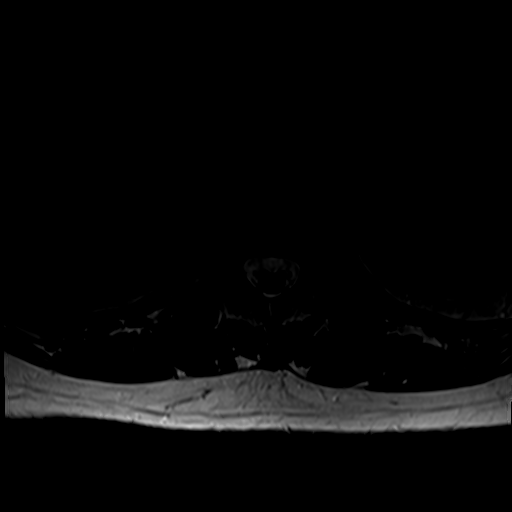

[Series 7: T1 · axial · 4.0mm · 0.39mm/px · z∈[-43,+141]mm · 4 of 40 slices shown (2 of 2)]
[im 1/40]
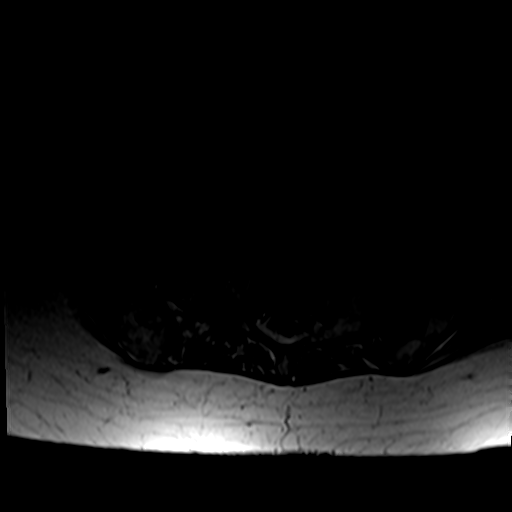
[im 6/40]
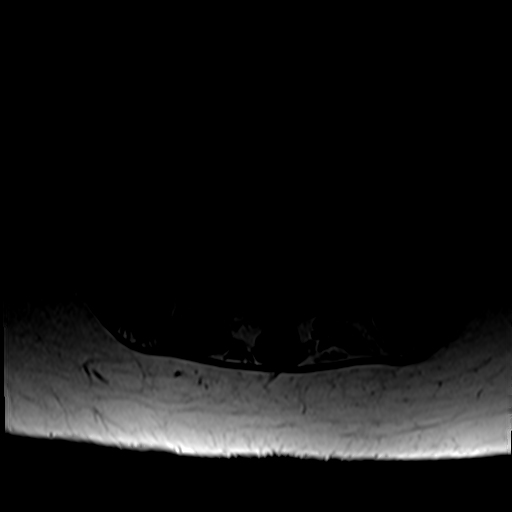
[im 20/40]
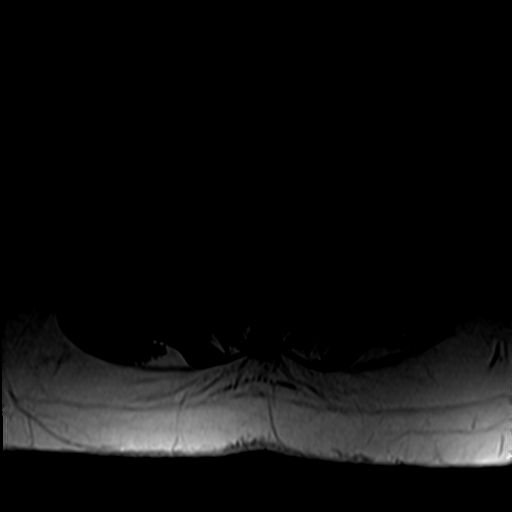
[im 34/40]
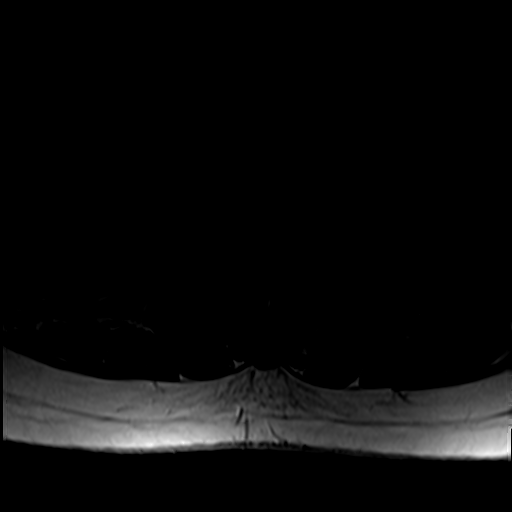

[25 of 48 positions shown; findings below may reference images not displayed]

FINDINGS: Segmentation: Standard. Lowest well-formed disc space labeled the
L5-S1 level.

Alignment: Underlying mild sigmoid scoliosis. 2 mm retrolisthesis of
T11 on T12. 4 mm anterolisthesis of L3 on L4, with 5 mm
anterolisthesis of L4 on L5. Findings chronic and facet mediated.

Vertebrae: Vertebral body height maintained without acute or chronic
fracture. Bone marrow signal intensity within normal limits. No
discrete or worrisome osseous lesions. No abnormal marrow edema.

Conus medullaris and cauda equina: Conus extends to the L1 level.
Conus and cauda equina appear normal.

Paraspinal and other soft tissues: Paraspinous soft tissues within
normal limits. Probable fibroid uterus partially visualized.
Visualized visceral structures otherwise unremarkable.

Disc levels:

T11-12: Seen only on sagittal projection. 2 mm retrolisthesis.
Degenerative intervertebral disc space narrowing with diffuse disc
bulge and disc desiccation. Mild bilateral facet hypertrophy. No
significant spinal stenosis. Mild right foraminal narrowing. Left
neural foramina remains patent.

T12-L1: Unremarkable.

L1-2: Normal interspace. Mild bilateral facet hypertrophy. No canal
or foraminal stenosis.

L2-3: Moderate degenerative intervertebral disc space narrowing with
diffuse disc bulge and disc desiccation. Associated reactive
endplate spurring. Superimposed small left subarticular disc
protrusion with inferior migration (series 3, image 9). Additional
right foraminal to extraforaminal disc protrusion with slight
superior migration present at this level as well (series 6, image
16). Mild to moderate facet and ligament flavum hypertrophy.
Resultant mild canal with left lateral recess stenosis. Moderate
right with mild to moderate left L2 foraminal narrowing.

L3-4: 4 mm anterolisthesis. Degenerative intervertebral disc space
narrowing with diffuse disc bulge, asymmetric to the left.
Associated discogenic reactive endplate change with marginal
endplate osteophytic spurring. Moderate left worse than right facet
arthrosis. Resultant moderate canal and right lateral recess
stenosis, with more severe narrowing of the left lateral recess.
Moderate right with severe left L3 foraminal stenosis.

L4-5: 5 mm anterolisthesis. Mild diffuse disc bulge with disc
desiccation, asymmetric to the left. Moderate facet and ligament
flavum hypertrophy. Resultant moderate to severe canal with
bilateral subarticular stenosis, worse on the left. Moderate
bilateral L4 foraminal narrowing.

L5-S1: Degenerative intervertebral disc space narrowing with diffuse
disc bulge and disc desiccation. Reactive endplate spurring.
Superimposed broad-based left subarticular disc protrusion closely
approximates and/or contacts the descending left S1 nerve root
(series 6, image 34). Mild to moderate bilateral facet hypertrophy.
Resultant mild narrowing of the left lateral recess. Central canal
remains patent. Moderate left worse than right L5 foraminal
stenosis.
IMPRESSION: 1. Multifactorial degenerative changes at L3-4 and L4-5 with
resultant moderate to severe canal and bilateral subarticular
stenosis, with moderate to severe bilateral L3 and L4 foraminal
narrowing as above.
2. Broad-based left subarticular disc protrusion at L5-S1,
potentially affecting the descending left S1 nerve root.
3. Right foraminal to extraforaminal disc protrusion at L2-3 with
resultant moderate right L2 foraminal stenosis.
4. Additional small left subarticular disc protrusion at L2-3,
potentially affecting the descending left L3 nerve root.

## 2021-07-18 ENCOUNTER — Ambulatory Visit: Payer: Self-pay | Admitting: Nurse Practitioner

## 2021-07-22 ENCOUNTER — Other Ambulatory Visit: Payer: Self-pay

## 2021-07-22 DIAGNOSIS — C50411 Malignant neoplasm of upper-outer quadrant of right female breast: Secondary | ICD-10-CM

## 2021-07-22 DIAGNOSIS — Z170 Estrogen receptor positive status [ER+]: Secondary | ICD-10-CM

## 2021-07-23 ENCOUNTER — Telehealth: Payer: Self-pay

## 2021-07-23 ENCOUNTER — Inpatient Hospital Stay: Payer: No Typology Code available for payment source | Admitting: Hematology and Oncology

## 2021-07-23 ENCOUNTER — Inpatient Hospital Stay: Payer: No Typology Code available for payment source | Attending: Hematology and Oncology

## 2021-07-23 ENCOUNTER — Encounter: Payer: Self-pay | Admitting: Hematology and Oncology

## 2021-07-23 ENCOUNTER — Other Ambulatory Visit: Payer: Self-pay

## 2021-07-23 ENCOUNTER — Inpatient Hospital Stay (HOSPITAL_BASED_OUTPATIENT_CLINIC_OR_DEPARTMENT_OTHER): Payer: No Typology Code available for payment source | Admitting: Hematology and Oncology

## 2021-07-23 ENCOUNTER — Other Ambulatory Visit (HOSPITAL_COMMUNITY): Payer: Self-pay

## 2021-07-23 ENCOUNTER — Inpatient Hospital Stay: Payer: No Typology Code available for payment source

## 2021-07-23 VITALS — BP 106/65 | HR 72 | Temp 97.5°F | Resp 16 | Ht 66.0 in | Wt 205.5 lb

## 2021-07-23 DIAGNOSIS — Z170 Estrogen receptor positive status [ER+]: Secondary | ICD-10-CM | POA: Insufficient documentation

## 2021-07-23 DIAGNOSIS — Z923 Personal history of irradiation: Secondary | ICD-10-CM | POA: Insufficient documentation

## 2021-07-23 DIAGNOSIS — C50411 Malignant neoplasm of upper-outer quadrant of right female breast: Secondary | ICD-10-CM

## 2021-07-23 DIAGNOSIS — Z79811 Long term (current) use of aromatase inhibitors: Secondary | ICD-10-CM | POA: Insufficient documentation

## 2021-07-23 LAB — CBC WITH DIFFERENTIAL (CANCER CENTER ONLY)
Abs Immature Granulocytes: 0.01 10*3/uL (ref 0.00–0.07)
Basophils Absolute: 0 10*3/uL (ref 0.0–0.1)
Basophils Relative: 1 %
Eosinophils Absolute: 0.1 10*3/uL (ref 0.0–0.5)
Eosinophils Relative: 3 %
HCT: 33.5 % — ABNORMAL LOW (ref 36.0–46.0)
Hemoglobin: 11.1 g/dL — ABNORMAL LOW (ref 12.0–15.0)
Immature Granulocytes: 0 %
Lymphocytes Relative: 27 %
Lymphs Abs: 1.3 10*3/uL (ref 0.7–4.0)
MCH: 29.1 pg (ref 26.0–34.0)
MCHC: 33.1 g/dL (ref 30.0–36.0)
MCV: 87.7 fL (ref 80.0–100.0)
Monocytes Absolute: 0.3 10*3/uL (ref 0.1–1.0)
Monocytes Relative: 6 %
Neutro Abs: 3.1 10*3/uL (ref 1.7–7.7)
Neutrophils Relative %: 63 %
Platelet Count: 202 10*3/uL (ref 150–400)
RBC: 3.82 MIL/uL — ABNORMAL LOW (ref 3.87–5.11)
RDW: 14.2 % (ref 11.5–15.5)
WBC Count: 4.9 10*3/uL (ref 4.0–10.5)
nRBC: 0 % (ref 0.0–0.2)

## 2021-07-23 LAB — CMP (CANCER CENTER ONLY)
ALT: 15 U/L (ref 0–44)
AST: 16 U/L (ref 15–41)
Albumin: 4.1 g/dL (ref 3.5–5.0)
Alkaline Phosphatase: 76 U/L (ref 38–126)
Anion gap: 6 (ref 5–15)
BUN: 9 mg/dL (ref 8–23)
CO2: 28 mmol/L (ref 22–32)
Calcium: 9.2 mg/dL (ref 8.9–10.3)
Chloride: 103 mmol/L (ref 98–111)
Creatinine: 0.79 mg/dL (ref 0.44–1.00)
GFR, Estimated: >60 mL/min (ref >60)
Glucose, Bld: 130 mg/dL — ABNORMAL HIGH (ref 70–99)
Potassium: 4.6 mmol/L (ref 3.5–5.1)
Sodium: 137 mmol/L (ref 135–145)
Total Bilirubin: 0.4 mg/dL (ref 0.3–1.2)
Total Protein: 6.3 g/dL — ABNORMAL LOW (ref 6.5–8.1)

## 2021-07-23 MED ORDER — ABEMACICLIB 100 MG PO TABS
100.0000 mg | ORAL_TABLET | Freq: Two times a day (BID) | ORAL | 1 refills | Status: DC
  Filled 2021-07-23: qty 70, 35d supply, fill #0

## 2021-07-23 NOTE — Telephone Encounter (Signed)
Oral Oncology Pharmacist Encounter ? ?Received new prescription for abemaciclib (Verzenio) for the treatment of hormone receptor positive breast cancer in conjunction with anastrozole, planned duration up to 2 years or until disease progression or unacceptable toxicity. ? ?Labs from 07/23/21 (CMP, CBC) assessed, no interventions needed. Prescription dose and frequency assessed. ? ?Current medication list in Epic reviewed, DDIs with verzenio identified: ?- metformin: verzenio may increase the concentration resulting in increased toxicities of metformin - will monitor ? ?Evaluated chart and no patient barriers to medication adherence noted.  ? ?Patient agreement for treatment documented in MD note on 07/23/2021. ? ?Prescription has been e-scribed to the [facility] for benefits analysis and approval. ? ?Oral Oncology Clinic will continue to follow for insurance authorization, copayment issues, initial counseling and start date. ? ?Drema Halon, PharmD ?Hematology/Oncology Clinical Pharmacist ?[facility] ?[phone] ?07/23/2021 3:02 PM ? ? ?

## 2021-07-23 NOTE — Progress Notes (Signed)
?[facility]  ?[phone] [phone]  ? ? ? ?IDJoselin Crandell DOB: 02-Sep-1955  MR#: [id number]  URK#:[id number] ? ?Patient Care Team: ?Kristie Cowman, MD as PCP - General (Family Medicine) ?Mauro Kaufmann, RN as Oncology Nurse Navigator ?Rockwell Germany, RN as Oncology Nurse Navigator ?Erroll Luna, MD as Consulting Physician (General Surgery) ?Eppie Gibson, MD as Attending Physician (Radiation Oncology) ?Benay Pike, MD as Consulting Physician (Hematology and Oncology) ?Benay Pike, MD ?OTHER MD: ? ?CHIEF COMPLAINT: Estrogen receptor positive breast cancer ? ?CURRENT TREATMENT: Anastrozole; considering abemaciclib ? ?INTERVAL HISTORY: ? ?Zala returns today for follow up of her estrogen receptor positive breast cancer.  ?She is tolerating anastrozole reasonably well.  She has noticed diffuse musculoskeletal pains lately although she is very reluctant to switch to tamoxifen.  She states the hot flashes are also bothersome at times.  She is very motivated to complete all the treatment that she is going to need.  She is willing to try abemaciclib as recommended by Dr. Jana Hakim. ?Rest of the pertinent 10 point ROS reviewed and negative. ? ? ? COVID 19 VACCINATION STATUS: s/p  [employer]  X2, most recently June 2021, no booster as of 05/01/2020 ? ? ?HISTORY OF CURRENT ILLNESS: ?From the original intake note: ? ?[name] [name] herself palpated a mass in the right axilla "a long time ago", more recently with complaints of associated pain. She brought it to medical attention and underwent bilateral diagnostic mammography with tomography and bilateral breast ultrasonography at [facility] on 01/31/2020 showing: breast density category B; three right breast masses-- 1.9 cm at 12 o'clock, 2.3 cm at 11 o'clock, 0.4 cm at 1 o'clock; two adjacent enlarged thickened lymph nodes measuring up to 2.1 cm; no evidence of left breast malignancy. ? ?Accordingly on 02/03/2020 she proceeded  to biopsy of the right breast area at 12 o'clock and right axilla. The pathology from this procedure ([id number]) showed: invasive ductal carcinoma, grade 3. Prognostic indicators significant for: estrogen receptor, 100% positive with strong staining intensity and progesterone receptor, 0% negative. Proliferation marker Ki67 at 60%. HER2 equivocal by immunohistochemistry (2+), but negative by fluorescent in situ hybridization with a signals ratio 0.81 and number per cell 2.7. ? ?The biopsied lymph node was found to show metastatic carcinoma. The morphology was considered different from the biopsied mass, and a prognostic panel was performed. Estrogen receptor, 100% positive with strong staining intensity and progesterone receptor, 0% negative. Proliferation marker Ki67 at 70%. HER2 equivcoal by immunohistochemistry (2+), but negative) by fluorescent in situ hybridization with a signals ratio 1.16 and number per cell 3.9. ? ?She underwent additional right breast biopsies on 02/06/2020. Pathology [id number]) from the mass at 11 o'clock showed: invasive ductal carcinoma, grade 2. Prognostic indicators significant for: estrogen receptor, 100% positive with strong staining intensity and progesterone receptor, 0% negative. Proliferation marker Ki67 at 15%. HER2 equivocal by immunohistochemistry (2+), but negative by fluorescent in situ hybridization with a signals ratio 1.29 and number per cell 2.7. ? ?The biopsied mass at 1 o'clock showed only fibrocystic change. ? ?The patient's subsequent history is as detailed below. ? ? ?PAST MEDICAL HISTORY: ?Past Medical History:  ?Diagnosis Date  ? Arthritis   ? Cancer [facility])   ? breast  ? Diabetes mellitus without complication ([place])   ? Hypertension   ? Personal history of chemotherapy   ? Personal history of radiation therapy   ? ? ?PAST SURGICAL HISTORY: ?Past Surgical History:  ?Procedure Laterality Date  ? BREAST BIOPSY  Right 02/03/2020  ? x2  ? BREAST BIOPSY Right 02/06/2020   ? x2  ? BREAST LUMPECTOMY Right 07/26/2020  ? BREAST LUMPECTOMY WITH RADIOACTIVE SEED AND SENTINEL LYMPH NODE BIOPSY Right 07/26/2020  ? Procedure: RIGHT BREAST LUMPECTOMY WITH RADIOACTIVE SEED X 2 AND SENTINEL LYMPH NODE BIOPSY;  Surgeon: Erroll Luna, MD;  Location: [facility];  Service: General;  Laterality: Right;  ? IR IMAGING GUIDED PORT INSERTION  02/23/2020  ? PORT-A-CATH REMOVAL N/A 07/26/2020  ? Procedure: REMOVAL PORT-A-CATH;  Surgeon: Erroll Luna, MD;  Location: [facility] [facility];  Service: General;  Laterality: N/A;  ? ? ?FAMILY HISTORY: ?Family History  ?Problem Relation Age of Onset  ? Asthma Mother   ? Arthritis Sister   ? Asthma Brother   ? Arthritis Brother   ?[name] died in his 2s from causes unknown to the patient.  [name] died from heart disease at age 10.  The patient had 4 brothers, 1 sister, with no history of cancer in the family to her knowledge ? ? ?GYNECOLOGIC HISTORY:  ?No LMP recorded. Patient is postmenopausal. ?Menarche: 66 years old ?Age at first live birth: 66 years old ?GX P 1 ?LMP 33s ?Contraceptive ?HRT no  ?Hysterectomy? no ?BSO? no ? ? ?SOCIAL HISTORY: (updated 02/2020)  ?[name] [name] is originally from [place].  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  She describes herself as single.  At home she lives with her daughter [name] [name] who works for [employer], her daughters' husband [name] [name] who is a English as a second language teacher, and their children who are 31 months old and 46 years old.  The patient is Sallee [name] ?  ? ADVANCED DIRECTIVES: Not in place.  The patient tells me she would intend to name her daughter [name] [name] as her healthcare power of attorney ? ? ?HEALTH MAINTENANCE: ?Social History  ? ?Tobacco Use  ? Smoking status: Never  ? Smokeless tobacco: Never  ?Vaping Use  ? Vaping Use: Never used  ?Substance Use Topics  ? Alcohol use: Yes  ?  Alcohol/week: 1.0 standard drink  ?  Types: 1 Glasses of wine per week  ?   Comment: occasionally  ? Drug use: Never  ? ? ? Colonoscopy: never done ? [place]AP: 2017 (performed in [place]) ? Bone density:  ?  ?Allergies  ?Allergen Reactions  ? Sulfa Antibiotics Itching  ? ? ?Current Outpatient Medications  ?Medication Sig Dispense Refill  ? acetaminophen (TYLENOL) 500 MG tablet Take 500 mg by mouth 3 (three) times daily as needed.    ? amLODipine (NORVASC) 5 MG tablet Take 5 mg by mouth daily.    ? anastrozole (ARIMIDEX) 1 MG tablet Take 1 tablet (1 mg total) by mouth daily. 90 tablet 4  ? atorvastatin (LIPITOR) 40 MG tablet Take 40 mg by mouth daily.    ? carvedilol (COREG) 6.25 MG tablet Take 6.25 mg by mouth 2 (two) times daily with a meal.    ? ibuprofen (ADVIL) 800 MG tablet Take 1 tablet (800 mg total) by mouth every 8 (eight) hours as needed. 30 tablet 0  ? lansoprazole (PREVACID) 30 MG capsule Take 30 mg by mouth every morning.    ? losartan (COZAAR) 100 MG tablet Take 100 mg by mouth daily.    ? metFORMIN (GLUCOPHAGE) 1000 MG tablet Take 1,000 mg by mouth daily with breakfast.     ? ?No current facility-administered medications for this visit.  ? ? ?OBJECTIVE: African-American woman  in no acute distress ? ?Vitals:  ? 07/23/21 1425  ?BP: 106/65  ?Pulse: 72  ?Resp: 16  ?Temp: (!) 97.5 ?F (36.4 ?C)  ?SpO2: 100%  ? ?   Body mass index is 33.17 kg/m?.   ?Wt Readings from Last 3 Encounters:  ?07/23/21 205 lb 8 oz (93.2 kg)  ?05/21/21 197 lb 11.2 oz (89.7 kg)  ?02/07/21 200 lb 12.8 oz (91.1 kg)  ? ?  ECOG FS:1 - Symptomatic but completely ambulatory ? ?Physical Exam ?Constitutional:   ?   Appearance: Normal appearance.  ?Cardiovascular:  ?   Rate and Rhythm: Normal rate and regular rhythm.  ?   Pulses: Normal pulses.  ?   Heart sounds: Normal heart sounds.  ?Chest:  ?   Comments: Right breast status postradiation changes.  No concern for recurrence.  Left breast normal to inspection and palpation.  No regional adenopathy ?Musculoskeletal:  ?   Cervical back: Normal range of motion and neck  supple. No rigidity.  ?Lymphadenopathy:  ?   Cervical: No cervical adenopathy.  ?Skin: ?   General: Skin is warm and dry.  ?Neurological:  ?   General: No focal deficit present.  ?   Mental Status: She is al

## 2021-07-25 ENCOUNTER — Ambulatory Visit (INDEPENDENT_AMBULATORY_CARE_PROVIDER_SITE_OTHER): Payer: Self-pay | Admitting: Orthopaedic Surgery

## 2021-07-25 ENCOUNTER — Other Ambulatory Visit: Payer: Self-pay

## 2021-07-25 DIAGNOSIS — M1712 Unilateral primary osteoarthritis, left knee: Secondary | ICD-10-CM

## 2021-07-25 DIAGNOSIS — M545 Low back pain, unspecified: Secondary | ICD-10-CM

## 2021-07-25 DIAGNOSIS — G8929 Other chronic pain: Secondary | ICD-10-CM

## 2021-07-25 NOTE — Progress Notes (Signed)
? ?                            ? ? ?Chief Complaint: Bilateral knee pain right leg pain ?  ? ? ?History of Present Illness:  ? ?07/25/2021: Presents for followup of her left knee as well as right lower back pain radiating down the leg.  At this time the pain in the left knee has also become significantly bothersome to her.  She does use a cane in her right hand.  She states that the right anterior thigh is now going numb as well in addition to being painful.  Here today for MRI discussion. ? ?Gaylynn Seiple is a 66 y.o. female presents today with pain that has been radiating down the right leg.  This has been going on for several months.  This is interfering with her ability to sit and stand.  She is taking meloxicam without significant relief.  She has had injections multiple steroid injections into both knees previously were in the [place].  These were done many years prior.  He states that she is no longer getting any relief from these injections.  Here today for further discussion of her radiating pain as well as bilateral knee pain.  She walks with a cane at baseline. ? ? ? ?Surgical History:   ?None ? ?PMH/PSH/Family History/Social History/Meds/Allergies:   ? ?Past Medical History:  ?Diagnosis Date  ? Arthritis   ? Cancer [facility])   ? breast  ? Diabetes mellitus without complication ([place])   ? Hypertension   ? Personal history of chemotherapy   ? Personal history of radiation therapy   ? ?Past Surgical History:  ?Procedure Laterality Date  ? BREAST BIOPSY Right 02/03/2020  ? x2  ? BREAST BIOPSY Right 02/06/2020  ? x2  ? BREAST LUMPECTOMY Right 07/26/2020  ? BREAST LUMPECTOMY WITH RADIOACTIVE SEED AND SENTINEL LYMPH NODE BIOPSY Right 07/26/2020  ? Procedure: RIGHT BREAST LUMPECTOMY WITH RADIOACTIVE SEED X 2 AND SENTINEL LYMPH NODE BIOPSY;  Surgeon: Erroll Luna, MD;  Location: [place];  Service: General;  Laterality: Right;  ? IR IMAGING GUIDED PORT INSERTION  02/23/2020  ? PORT-A-CATH REMOVAL  N/A 07/26/2020  ? Procedure: REMOVAL PORT-A-CATH;  Surgeon: Erroll Luna, MD;  Location: [place];  Service: General;  Laterality: N/A;  ? ?Social History  ? ?Socioeconomic History  ? Marital status: Single  ?  Spouse name: Not on file  ? Number of children: 1  ? Years of education: Not on file  ? Highest education level: 9th grade  ?Occupational History  ? Not on file  ?Tobacco Use  ? Smoking status: Never  ? Smokeless tobacco: Never  ?Vaping Use  ? Vaping Use: Never used  ?Substance and Sexual Activity  ? Alcohol use: Yes  ?  Alcohol/week: 1.0 standard drink  ?  Types: 1 Glasses of wine per week  ?  Comment: occasionally  ? Drug use: Never  ? Sexual activity: Not Currently  ?Other Topics Concern  ? Not on file  ?Social History Narrative  ? Not on file  ? ?Social Determinants of Health  ? ?Financial Resource Strain: Not on file  ?Food Insecurity: No Food Insecurity  ? Worried About Charity fundraiser in the Last Year: Never true  ? Ran Out of Food in the Last Year: Never true  ?Transportation Needs: No Transportation Needs  ? Lack of Transportation (Medical): No  ?  Lack of Transportation (Non-Medical): No  ?Physical Activity: Not on file  ?Stress: Not on file  ?Social Connections: Not on file  ? ?Family History  ?Problem Relation Age of Onset  ? Asthma Mother   ? Arthritis Sister   ? Asthma Brother   ? Arthritis Brother   ? ?Allergies  ?Allergen Reactions  ? Sulfa Antibiotics Itching  ? ?Current Outpatient Medications  ?Medication Sig Dispense Refill  ? abemaciclib (VERZENIO) 100 MG tablet Take 1 tablet (100 mg total) by mouth 2 (two) times daily. Swallow tablets whole. Do not chew, crush, or split tablets before swallowing. 60 tablet 1  ? acetaminophen (TYLENOL) 500 MG tablet Take 500 mg by mouth 3 (three) times daily as needed.    ? amLODipine (NORVASC) 5 MG tablet Take 5 mg by mouth daily.    ? anastrozole (ARIMIDEX) 1 MG tablet Take 1 tablet (1 mg total) by mouth daily. 90 tablet 4  ?  atorvastatin (LIPITOR) 40 MG tablet Take 40 mg by mouth daily.    ? carvedilol (COREG) 6.25 MG tablet Take 6.25 mg by mouth 2 (two) times daily with a meal.    ? ibuprofen (ADVIL) 800 MG tablet Take 1 tablet (800 mg total) by mouth every 8 (eight) hours as needed. 30 tablet 0  ? lansoprazole (PREVACID) 30 MG capsule Take 30 mg by mouth every morning.    ? losartan (COZAAR) 100 MG tablet Take 100 mg by mouth daily.    ? metFORMIN (GLUCOPHAGE) 1000 MG tablet Take 1,000 mg by mouth daily with breakfast.     ? ?No current facility-administered medications for this visit.  ? ?No results found. ? ?Review of Systems:   ?A ROS was performed including pertinent positives and negatives as documented in the HPI. ? ?Physical Exam :   ?Constitutional: NAD and appears stated age ?Neurological: Alert and oriented ?Psych: Appropriate affect and cooperative ?There were no vitals taken for this visit.  ? ?Comprehensive Musculoskeletal Exam:   ? ?  ?Musculoskeletal Exam  ?Gait Normal  ?Alignment Normal  ? Right Left  ?Inspection Normal Normal  ?Palpation    ?Tenderness Tricompartmental Tricompartmental  ?Crepitus Positive Positive  ?Effusion Trace Trace  ?Range of Motion    ?Extension 0 0  ?Flexion 120 120  ?Strength    ?Extension 5/5 5/5  ?Flexion 5/5 5/5  ?Ligament Exam     ?Generalized Laxity No No  ?Lachman Negative Negative   ?Pivot Shift Negative Negative  ?Anterior Drawer Negative Negative  ?Valgus at 0 Negative Negative  ?Valgus at 20 Negative Negative  ?Varus at 0 0 0  ?Varus at 20   0 0  ?Posterior Drawer at 90 0 0  ?Vascular/Lymphatic Exam    ?Edema None None  ?Venous Stasis Changes No No  ?Distal Circulation Normal Normal  ?Neurologic    ?Light Touch Sensation Intact Intact  ?Special Tests:   ? ?There is pain radiating down the right thigh.  She has minimal pain with internal/external rotation of the right hip.  Positive straight leg raise on the right. ? ?Imaging:   ?Xray (x-rays right knee 4 views, x-rays left knee 4  views, AP pelvis, x-ray right hip 2 views, x-ray left hip 2 views): ?Severe tricompartmental osteoarthritis of both knees.  Bilateral hips are well preserved without significant arthritis. ? 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 ?I personally reviewed and interpreted the radiographs. ? ? ?Assessment:   ?66 year old female very pleasant  with pain radiating down the lower back into the right leg which is consistent with a lumbar radiculitis.  At this time I would like to plan to refer her to Dr. Ernestina Patches for an injection at the L3-L4 level to hopefully assist with the pain going down the anterior aspect of the right thigh.  At this time she is also considering a left total knee arthroplasty as she does have end-stage osteoarthritis.  I do believe that she would be a good candidate for total knee arthroplasty.  I discussed that it is unlikely that injections would benefit this left knee given the advanced arthritis.  I will plan for referral to my partner Dr. Erlinda Hong for discussion of total knee arthroplasty ? ?Plan :   ? ?-Plan for referral to Dr. Ernestina Patches for lumbar injection as well as to Dr. Erlinda Hong for consideration of left total knee arthroplasty ? ? ? ? ? ?I personally saw and evaluated the patient, and participated in the management and treatment plan. ? ?Vanetta Mulders, MD ?Attending Physician, Orthopedic Surgery ? ?This document was dictated using Systems analyst. A reasonable attempt at proof reading has been made to minimize errors. ?

## 2021-07-30 ENCOUNTER — Encounter: Payer: Self-pay | Admitting: Hematology and Oncology

## 2021-07-30 NOTE — Progress Notes (Signed)
Patient's daughter [name] called regarding financial assistance discount expiring soon. ? ?Provided number [phone] to customer service to have them mail her an application to renew. She verbalized understanding. ?

## 2021-08-05 ENCOUNTER — Other Ambulatory Visit: Payer: Self-pay

## 2021-08-05 ENCOUNTER — Other Ambulatory Visit (HOSPITAL_COMMUNITY): Payer: Self-pay

## 2021-08-05 ENCOUNTER — Encounter: Payer: Self-pay | Admitting: Family

## 2021-08-05 ENCOUNTER — Ambulatory Visit (INDEPENDENT_AMBULATORY_CARE_PROVIDER_SITE_OTHER): Payer: Self-pay | Admitting: Family

## 2021-08-05 ENCOUNTER — Encounter: Payer: Self-pay | Admitting: Oncology

## 2021-08-05 ENCOUNTER — Telehealth: Payer: Self-pay

## 2021-08-05 VITALS — BP 126/70 | HR 74 | Temp 97.3°F | Resp 16 | Ht 66.0 in | Wt 205.4 lb

## 2021-08-05 DIAGNOSIS — K219 Gastro-esophageal reflux disease without esophagitis: Secondary | ICD-10-CM

## 2021-08-05 DIAGNOSIS — M159 Polyosteoarthritis, unspecified: Secondary | ICD-10-CM

## 2021-08-05 DIAGNOSIS — Z113 Encounter for screening for infections with a predominantly sexual mode of transmission: Secondary | ICD-10-CM

## 2021-08-05 DIAGNOSIS — E785 Hyperlipidemia, unspecified: Secondary | ICD-10-CM

## 2021-08-05 DIAGNOSIS — Z7689 Persons encountering health services in other specified circumstances: Secondary | ICD-10-CM

## 2021-08-05 DIAGNOSIS — I10 Essential (primary) hypertension: Secondary | ICD-10-CM

## 2021-08-05 DIAGNOSIS — Z1159 Encounter for screening for other viral diseases: Secondary | ICD-10-CM

## 2021-08-05 DIAGNOSIS — E1142 Type 2 diabetes mellitus with diabetic polyneuropathy: Secondary | ICD-10-CM

## 2021-08-05 MED ORDER — ATORVASTATIN CALCIUM 40 MG PO TABS: 40.0000 mg | ORAL_TABLET | Freq: Every day | ORAL | 1 refills | Status: DC

## 2021-08-05 NOTE — Telephone Encounter (Signed)
Oral Oncology Pharmacist Encounter ? ?Attempted to reach patient multiple times to have patient bring in signed patient assistance papers. Have been unable to reach patient at this time. Oral oncology clinic will continue to try to reach patient to start application.  ? ?Drema Halon, PharmD ?Hematology/Oncology Clinical Pharmacist ?[facility] ?[phone] ? ?

## 2021-08-05 NOTE — Patient Instructions (Signed)

## 2021-08-05 NOTE — Progress Notes (Signed)
? ?Provider: Marlowe Sax FNP-C  ? ?Karlos Scadden, Nelda Bucks, NP ? ?Patient Care Team: ?Courtnie Brenes, Nelda Bucks, NP as PCP - General (Family Medicine) ?Mauro Kaufmann, RN as Oncology Nurse Navigator ?Rockwell Germany, RN as Oncology Nurse Navigator ?Erroll Luna, MD as Consulting Physician (General Surgery) ?Eppie Gibson, MD as Attending Physician (Radiation Oncology) ?Benay Pike, MD as Consulting Physician (Hematology and Oncology) ? ?Extended Emergency Contact Information ?Primary Emergency Contact: [name] ?         [place], [place] [address] [place] ?Home Phone: [phone] ?Mobile Phone: [phone] ?Relation: Daughter ? ?Code Status:  Full Code  ?Goals of care: Advanced Directive information ? ?  08/05/2021  ?  1:40 PM  ?Advanced Directives  ?Does Patient Have a Medical Advance Directive? No  ?Would patient like information on creating a medical advance directive? No - Patient declined  ? ? ? ?Chief Complaint  ?Patient presents with  ? Establish Care  ?  New Patient.   ? ? ?HPI:  ?Pt is a 66 y.o. female seen today establish care here at [facility] for medical management of chronic diseases. She has a medical history of hypertension, type 2 diabetes with neuropathy, osteoarthritis of multiple sites, GERD, history of right breast cancer s/p lumpectomy in 2022, former cigarette smoker, alcohol use, history of DVT among others.  She is here with her daughter. ? ?She follows up with oncologist Dr. Delfina Redwood for right breast cancer.  Currently on anastrozole 1 mg tablet daily.  Complains of right nipple discharge.  Has upcoming mammogram ordered on 07/23/2021 by the oncologist. ? ?Type 2 diabetes with neuropathy -states right leg numbness and tingling.  Unclear if from diabetic or from her low back pain.  She brought in her glucometer today's blood sugars ranges from 100-130's with a 1 episode in the 160s. ? ?Thinks she had Tdap in 2021 in [place]. ? ?Exercise limited due to knee  pain ? ? ?Past Medical History:  ?Diagnosis Date  ? Arthritis   ? Breast cancer ([place])   ? Cancer [facility])   ? breast  ? Diabetes mellitus without complication ([place])   ? History of MRI   ? Knee, Hip, and Back.  ? Hypertension   ? Personal history of chemotherapy   ? Personal history of radiation therapy   ? ?Past Surgical History:  ?Procedure Laterality Date  ? BREAST BIOPSY Right 02/03/2020  ? x2  ? BREAST BIOPSY Right 02/06/2020  ? x2  ? BREAST LUMPECTOMY Right 07/26/2020  ? BREAST LUMPECTOMY WITH RADIOACTIVE SEED AND SENTINEL LYMPH NODE BIOPSY Right 07/26/2020  ? Procedure: RIGHT BREAST LUMPECTOMY WITH RADIOACTIVE SEED X 2 AND SENTINEL LYMPH NODE BIOPSY;  Surgeon: Erroll Luna, MD;  Location: [place];  Service: General;  Laterality: Right;  ? IR IMAGING GUIDED PORT INSERTION  02/23/2020  ? PORT-A-CATH REMOVAL N/A 07/26/2020  ? Procedure: REMOVAL PORT-A-CATH;  Surgeon: Erroll Luna, MD;  Location: [place];  Service: General;  Laterality: N/A;  ? ? ?Allergies  ?Allergen Reactions  ? Sulfa Antibiotics Itching  ? ? ?Allergies as of 08/05/2021   ? ?   Reactions  ? Sulfa Antibiotics Itching  ? ?  ? ?  ?Medication List  ?  ? ?  ? Accurate as of August 05, 2021  2:40 PM. If you have any questions, ask your nurse or doctor.  ?  ?  ? ?  ? ?STOP taking these medications   ? ?abemaciclib  100 MG tablet ?Commonly known as: VERZENIO ?Stopped by: Sandrea Hughs, NP ?  ?amLODipine 5 MG tablet ?Commonly known as: NORVASC ?Stopped by: Sandrea Hughs, NP ?  ?ibuprofen 800 MG tablet ?Commonly known as: ADVIL ?Stopped by: Sandrea Hughs, NP ?  ?losartan 100 MG tablet ?Commonly known as: COZAAR ?Stopped by: Sandrea Hughs, NP ?  ? ?  ? ?TAKE these medications   ? ?acetaminophen 500 MG tablet ?Commonly known as: TYLENOL ?Take 500 mg by mouth 3 (three) times daily as needed. ?  ?anastrozole 1 MG tablet ?Commonly known as: ARIMIDEX ?Take 1 tablet (1 mg total) by mouth daily. ?  ?aspirin 81 MG  chewable tablet ?Chew 81 mg by mouth daily. ?  ?atorvastatin 40 MG tablet ?Commonly known as: LIPITOR ?Take 1 tablet (40 mg total) by mouth daily. ?  ?carvedilol 6.25 MG tablet ?Commonly known as: COREG ?Take 6.25 mg by mouth 2 (two) times daily with a meal. ?  ?CENTRUM SILVER 50+WOMEN PO ?Take 1 capsule by mouth daily. ?  ?Garlic 4128 MG Caps ?Take 1 capsule by mouth daily. ?  ?lansoprazole 30 MG capsule ?Commonly known as: PREVACID ?Take 30 mg by mouth every morning. ?  ?Magnesium 100 MG Caps ?Take 1 capsule by mouth 2 (two) times daily. ?  ?meloxicam 15 MG tablet ?Commonly known as: MOBIC ?Take 15 mg by mouth daily. ?  ?metFORMIN 1000 MG tablet ?Commonly known as: GLUCOPHAGE ?Take 1,000 mg by mouth 2 (two) times daily with a meal. ?  ?valsartan 320 MG tablet ?Commonly known as: DIOVAN ?Take 320 mg by mouth daily. ?  ? ?  ? ? ?Review of Systems  ?Constitutional:  Negative for appetite change, chills, fatigue, fever and unexpected weight change.  ?HENT:  Negative for congestion, dental problem, ear discharge, ear pain, facial swelling, hearing loss, nosebleeds, postnasal drip, rhinorrhea, sinus pressure, sinus pain, sneezing, sore throat, tinnitus and trouble swallowing.   ?Eyes:  Negative for pain, discharge, redness, itching and visual disturbance.  ?Respiratory:  Negative for cough, chest tightness, shortness of breath and wheezing.   ?     Reports nipple discharge has upcoming mammogram for evaluation.  ?Cardiovascular:  Negative for chest pain, palpitations and leg swelling.  ?Gastrointestinal:  Negative for abdominal distention, abdominal pain, blood in stool, constipation, diarrhea, nausea and vomiting.  ?Endocrine: Negative for cold intolerance, heat intolerance, polydipsia, polyphagia and polyuria.  ?Genitourinary:  Negative for difficulty urinating, dysuria, flank pain, frequency and urgency.  ?Musculoskeletal:  Positive for arthralgias, back pain, gait problem and joint swelling. Negative for myalgias,  neck pain and neck stiffness.  ?Skin:  Negative for color change, pallor, rash and wound.  ?Neurological:  Positive for numbness. Negative for dizziness, syncope, speech difficulty, weakness, light-headedness and headaches.  ?     Numbness on feet   ?Hematological:  Does not bruise/bleed easily.  ?Psychiatric/Behavioral:  Negative for agitation, behavioral problems, confusion, hallucinations, self-injury, sleep disturbance and suicidal ideas. The patient is not nervous/anxious.   ? ?Immunization History  ?Administered Date(s) Administered  ? Unspecified SARS-COV-2 Vaccination 09/13/2019, 11/06/2019  ? ?Pertinent  Health Maintenance Due  ?Topic Date Due  ? COLONOSCOPY (Pts 45-73yr Insurance coverage will need to be confirmed)  Never done  ? INFLUENZA VACCINE  Never done  ? MAMMOGRAM  02/08/2023  ? PAP SMEAR-Modifier  02/08/2024  ? ? ?  06/29/2020  ?  3:05 PM 07/18/2020  ?  4:01 PM 07/26/2020  ? 11:51 AM 01/04/2021  ?  3:33 PM  08/05/2021  ?  1:39 PM  ?Fall Risk  ?Falls in the past year?     0  ?Was there an injury with Fall?     0  ?Fall Risk Category Calculator     0  ?Fall Risk Category     Low  ?Patient Fall Risk Level High fall risk High fall risk High fall risk High fall risk Low fall risk  ?Patient at Risk for Falls Due to     No Fall Risks  ?Fall risk Follow up     Falls evaluation completed  ? ?Functional Status Survey: ?  ? ?Vitals:  ? 08/05/21 1334  ?BP: 126/70  ?Pulse: 74  ?Resp: 16  ?Temp: (!) 97.3 ?F (36.3 ?C)  ?SpO2: 97%  ?Weight: 205 lb 6.4 oz (93.2 kg)  ?Height: '5\' 6"'$  (1.676 m)  ? ?Body mass index is 33.15 kg/m?Marland Kitchen ?Physical Exam ?Vitals reviewed.  ?Constitutional:   ?   General: She is not in acute distress. ?   Appearance: Normal appearance. She is obese. She is not ill-appearing or diaphoretic.  ?HENT:  ?   Head: Normocephalic.  ?   Right Ear: Tympanic membrane, ear canal and external ear normal. There is no impacted cerumen.  ?   Left Ear: Tympanic membrane, ear canal and external ear normal. There is no  impacted cerumen.  ?   Nose: Nose normal. No congestion or rhinorrhea.  ?   Mouth/Throat:  ?   Mouth: Mucous membranes are moist.  ?   Pharynx: Oropharynx is clear. No oropharyngeal exudate or posterior oroph

## 2021-08-05 NOTE — Telephone Encounter (Signed)
Oral Oncology Patient Advocate Encounter ? ?Met patient in [place] to complete an application for [employer] Patient Assistance Program in an effort to reduce the patient's out of pocket expense for Verzenio to $0.   ? ?Application completed and faxed to [phone].  ? ?[employer] patient assistance phone number for follow up is [phone].  ? ?This encounter will be updated until final determination.  ? ? ? ?Wynn Maudlin CPHT ?Specialty Pharmacy Patient Advocate ?[place] ?Phone [phone] ?Fax [phone] ?08/05/2021 3:42 PM ? ?

## 2021-08-06 ENCOUNTER — Ambulatory Visit (INDEPENDENT_AMBULATORY_CARE_PROVIDER_SITE_OTHER): Payer: Self-pay | Admitting: Orthopaedic Surgery

## 2021-08-06 VITALS — Ht 66.0 in | Wt 205.4 lb

## 2021-08-06 DIAGNOSIS — M1712 Unilateral primary osteoarthritis, left knee: Secondary | ICD-10-CM

## 2021-08-06 NOTE — Progress Notes (Signed)
? ?Office Visit Note ?  ?[name]           ?Date of Birth: [birth year]           ?MRN: [id number] ?Visit Date: 08/06/2021 ?             ?Requested by: Vanetta Mulders, MD ?[phone] ?[address] ?[place],  [place] [address] ?PCP: Ngetich, Nelda Bucks, NP ? ? ?Assessment & Plan: ?Visit Diagnoses:  ?1. Primary osteoarthritis of left knee   ? ? ?Plan: Impression is advanced tricompartmental DJD.  She has bone-on-bone in all 3 compartments.  She has slight lateral subluxation of the tibia.  Given lack of relief from conservative management at this time I have recommended a left total knee replacement as a more definitive solution for pain relief and improvement in quality of life and ability to perform ADLs.  Risk benefits prognosis reviewed with the patient detail.  She did develop an upper extremity DVT as a result of a port while she was being treated for breast cancer.  She was treated with Xarelto for a couple weeks.  Plan is to treat with Xarelto for 2 weeks postop and then transition to baby aspirin twice a day for a month. ? ?Total face to face encounter time was greater than 25 minutes and over half of this time was spent in counseling and/or coordination of care. ? ?Follow-Up Instructions: No follow-ups on file.  ? ?Orders:  ?No orders of the defined types were placed in this encounter. ? ?No orders of the defined types were placed in this encounter. ? ? ? ? Procedures: ?No procedures performed ? ? ?Clinical Data: ?No additional findings. ? ? ?Subjective: ?Chief Complaint  ?Patient presents with  ? Left Knee - Pain  ? ? ?HPI ? ?Patient is a very pleasant 66 year old female here with her daughter for surgical consultation of left total knee replacement.  She is referral from Dr. Sammuel Hines.  She has undergone extensive conservative management while she lived in the [place].  She has had multiple steroid injections which are no longer providing any relief.  She walks with a cane at baseline.  Denies any  injuries.  She has significant limitation and difficulty with ADLs and ability to sleep.   ? ?Review of Systems  ?Constitutional: Negative.   ?HENT: Negative.    ?Eyes: Negative.   ?Respiratory: Negative.    ?Cardiovascular: Negative.   ?Endocrine: Negative.   ?Musculoskeletal: Negative.   ?Neurological: Negative.   ?Hematological: Negative.   ?Psychiatric/Behavioral: Negative.    ?All other systems reviewed and are negative. ? ? ?Objective: ?Vital Signs: Ht '5\' 6"'$  (1.676 m)   Wt 205 lb 6.4 oz (93.2 kg)   BMI 33.15 kg/m?  ? ?Physical Exam ?Vitals and nursing note reviewed.  ?Constitutional:   ?   Appearance: She is well-developed.  ?HENT:  ?   Head: Normocephalic and atraumatic.  ?Pulmonary:  ?   Effort: Pulmonary effort is normal.  ?Abdominal:  ?   Palpations: Abdomen is soft.  ?Musculoskeletal:  ?   Cervical back: Neck supple.  ?Skin: ?   General: Skin is warm.  ?   Capillary Refill: Capillary refill takes less than 2 seconds.  ?Neurological:  ?   Mental Status: She is alert and oriented to person, place, and time.  ?Psychiatric:     ?   Behavior: Behavior normal.     ?   Thought Content: Thought content normal.     ?  Judgment: Judgment normal.  ? ? ?Ortho Exam ? ?Examination left knee shows 2+ crepitus throughout arc of motion.  Severe pain with flexion of the knee past 45 degrees.  Collaterals and cruciates are stable.  Small effusion. ? ?Specialty Comments:  ?MRI LUMBAR SPINE WITHOUT CONTRAST ?  ?TECHNIQUE: ?Multiplanar, multisequence MR imaging of the lumbar spine was ?performed. No intravenous contrast was administered. ?  ?COMPARISON:  None available. ?  ?FINDINGS: ?Segmentation: Standard. Lowest well-formed disc space labeled the ?L5-S1 level. ?  ?Alignment: Underlying mild sigmoid scoliosis. 2 mm retrolisthesis of ?T11 on T12. 4 mm anterolisthesis of L3 on L4, with 5 mm ?anterolisthesis of L4 on L5. Findings chronic and facet mediated. ?  ?Vertebrae: Vertebral body height maintained without acute or  chronic ?fracture. Bone marrow signal intensity within normal limits. No ?discrete or worrisome osseous lesions. No abnormal marrow edema. ?  ?Conus medullaris and cauda equina: Conus extends to the L1 level. ?Conus and cauda equina appear normal. ?  ?Paraspinal and other soft tissues: Paraspinous soft tissues within ?normal limits. Probable fibroid uterus partially visualized. ?Visualized visceral structures otherwise unremarkable. ?  ?Disc levels: ?  ?T11-12: Seen only on sagittal projection. 2 mm retrolisthesis. ?Degenerative intervertebral disc space narrowing with diffuse disc ?bulge and disc desiccation. Mild bilateral facet hypertrophy. No ?significant spinal stenosis. Mild right foraminal narrowing. Left ?neural foramina remains patent. ?  ?T12-L1: Unremarkable. ?  ?L1-2: Normal interspace. Mild bilateral facet hypertrophy. No canal ?or foraminal stenosis. ?  ?L2-3: Moderate degenerative intervertebral disc space narrowing with ?diffuse disc bulge and disc desiccation. Associated reactive ?endplate spurring. Superimposed small left subarticular disc ?protrusion with inferior migration (series 3, image 9). Additional ?right foraminal to extraforaminal disc protrusion with slight ?superior migration present at this level as well (series 6, image ?16). Mild to moderate facet and ligament flavum hypertrophy. ?Resultant mild canal with left lateral recess stenosis. Moderate ?right with mild to moderate left L2 foraminal narrowing. ?  ?L3-4: 4 mm anterolisthesis. Degenerative intervertebral disc space ?narrowing with diffuse disc bulge, asymmetric to the left. ?Associated discogenic reactive endplate change with marginal ?endplate osteophytic spurring. Moderate left worse than right facet ?arthrosis. Resultant moderate canal and right lateral recess ?stenosis, with more severe narrowing of the left lateral recess. ?Moderate right with severe left L3 foraminal stenosis. ?  ?L4-5: 5 mm anterolisthesis. Mild diffuse  disc bulge with disc ?desiccation, asymmetric to the left. Moderate facet and ligament ?flavum hypertrophy. Resultant moderate to severe canal with ?bilateral subarticular stenosis, worse on the left. Moderate ?bilateral L4 foraminal narrowing. ?  ?L5-S1: Degenerative intervertebral disc space narrowing with diffuse ?disc bulge and disc desiccation. Reactive endplate spurring. ?Superimposed broad-based left subarticular disc protrusion closely ?approximates and/or contacts the descending left S1 nerve root ?(series 6, image 34). Mild to moderate bilateral facet hypertrophy. ?Resultant mild narrowing of the left lateral recess. Central canal ?remains patent. Moderate left worse than right L5 foraminal ?stenosis. ?  ?IMPRESSION: ?1. Multifactorial degenerative changes at L3-4 and L4-5 with ?resultant moderate to severe canal and bilateral subarticular ?stenosis, with moderate to severe bilateral L3 and L4 foraminal ?narrowing as above. ?2. Broad-based left subarticular disc protrusion at L5-S1, ?potentially affecting the descending left S1 nerve root. ?3. Right foraminal to extraforaminal disc protrusion at L2-3 with ?resultant moderate right L2 foraminal stenosis. ?4. Additional small left subarticular disc protrusion at L2-3, ?potentially affecting the descending left L3 nerve root. ?  ?  ?Electronically Signed ?  By: Jeannine Boga M.D. ?  On: 07/16/2021 04:08 ? ?  Imaging: ?No results found. ? ? ?PMFS History: ?Patient Active Problem List  ? Diagnosis Date Noted  ? Primary osteoarthritis of left knee 08/06/2021  ? Aortic atherosclerosis ([place]) 04/24/2020  ? Deep venous thrombosis of left upper extremity ([place]) 04/24/2020  ? Port-A-Cath in place 03/26/2020  ? Malignant neoplasm of upper-outer quadrant of right breast in female, estrogen receptor positive ([place]) 02/13/2020  ? ?Past Medical History:  ?Diagnosis Date  ? Arthritis   ? Breast cancer ([place])   ? Cancer [facility])   ? breast  ? Diabetes mellitus without  complication ([place])   ? History of MRI   ? Knee, Hip, and Back.  ? Hypertension   ? Personal history of chemotherapy   ? Personal history of radiation therapy   ?  ?Family History  ?Problem Relation Age of Onset

## 2021-08-07 ENCOUNTER — Telehealth: Payer: Self-pay | Admitting: Orthopaedic Surgery

## 2021-08-07 NOTE — Telephone Encounter (Signed)
Called to discuss left total hip surgery date with patient.  Spoke with patient's daughter [name].  The [employer]  letter has expired on 08-01-21.  Patient is reapplying for assistance and will call me to schedule once letter is obtained.   ?

## 2021-08-07 NOTE — Telephone Encounter (Signed)
FYI

## 2021-08-08 ENCOUNTER — Encounter: Payer: Self-pay | Admitting: Oncology

## 2021-08-08 NOTE — Telephone Encounter (Signed)
Ok thanks 

## 2021-08-09 NOTE — Telephone Encounter (Signed)
Patient is approved for Verzenio at no cost from [employer] 08/09/21-08/10/22 ? ?[employer] uses [employer] ? ?Wynn Maudlin CPHT ?Specialty Pharmacy Patient Advocate ?[place] ?Phone [phone] ?Fax [phone] ?08/09/2021 10:20 AM ? ?

## 2021-08-14 ENCOUNTER — Other Ambulatory Visit: Payer: No Typology Code available for payment source

## 2021-08-14 ENCOUNTER — Other Ambulatory Visit: Payer: Self-pay | Admitting: *Deleted

## 2021-08-14 DIAGNOSIS — K219 Gastro-esophageal reflux disease without esophagitis: Secondary | ICD-10-CM

## 2021-08-14 DIAGNOSIS — Z113 Encounter for screening for infections with a predominantly sexual mode of transmission: Secondary | ICD-10-CM

## 2021-08-14 DIAGNOSIS — I10 Essential (primary) hypertension: Secondary | ICD-10-CM

## 2021-08-14 DIAGNOSIS — E785 Hyperlipidemia, unspecified: Secondary | ICD-10-CM

## 2021-08-14 DIAGNOSIS — Z1159 Encounter for screening for other viral diseases: Secondary | ICD-10-CM

## 2021-08-14 DIAGNOSIS — E1142 Type 2 diabetes mellitus with diabetic polyneuropathy: Secondary | ICD-10-CM

## 2021-08-14 MED ORDER — ABEMACICLIB 100 MG PO TABS: 100.0000 mg | ORAL_TABLET | Freq: Two times a day (BID) | ORAL | 1 refills | Status: DC

## 2021-08-14 NOTE — Telephone Encounter (Signed)
This RN reinstated Verzenio prescription on pt's med list per primary MD office removing it. ? ?Note pt has not started the prescription due to waiting for shipment. ?

## 2021-08-14 NOTE — Telephone Encounter (Signed)
Oral Chemotherapy Pharmacist Encounter ? ?I spoke with patient to see if she has heard from [employer] to set up shipment of the [employer]. They have not called her. I spoke to both her and her daughter on the line and they said they will call today to set up shipment. I informed them to call me if they have any problems and will follow up with them tomorrow for counseling on how to take the medication.  ? ?Drema Halon, PharmD ?Hematology/Oncology Clinical Pharmacist ?[facility] ?[phone] ?08/14/2021   8:55 AM ? ?

## 2021-08-15 LAB — CBC WITH DIFFERENTIAL/PLATELET
Absolute Monocytes: 180 cells/uL — ABNORMAL LOW (ref 200–950)
Basophils Absolute: 29 cells/uL (ref 0–200)
Basophils Relative: 1 %
Eosinophils Absolute: 70 cells/uL (ref 15–500)
Eosinophils Relative: 2.4 %
HCT: 36.5 % (ref 35.0–45.0)
Hemoglobin: 11.7 g/dL (ref 11.7–15.5)
Lymphs Abs: 1450 cells/uL (ref 850–3900)
MCH: 28.7 pg (ref 27.0–33.0)
MCHC: 32.1 g/dL (ref 32.0–36.0)
MCV: 89.7 fL (ref 80.0–100.0)
MPV: 9.9 fL (ref 7.5–12.5)
Monocytes Relative: 6.2 %
Neutro Abs: 1172 cells/uL — ABNORMAL LOW (ref 1500–7800)
Neutrophils Relative %: 40.4 %
Platelets: 220 10*3/uL (ref 140–400)
RBC: 4.07 10*6/uL (ref 3.80–5.10)
RDW: 13.4 % (ref 11.0–15.0)
Total Lymphocyte: 50 %
WBC: 2.9 10*3/uL — ABNORMAL LOW (ref 3.8–10.8)

## 2021-08-15 LAB — COMPLETE METABOLIC PANEL WITH GFR
AG Ratio: 1.9 (calc) (ref 1.0–2.5)
ALT: 14 U/L (ref 6–29)
AST: 17 U/L (ref 10–35)
Albumin: 4.1 g/dL (ref 3.6–5.1)
Alkaline phosphatase (APISO): 77 U/L (ref 37–153)
BUN: 7 mg/dL (ref 7–25)
CO2: 29 mmol/L (ref 20–32)
Calcium: 9.5 mg/dL (ref 8.6–10.4)
Chloride: 105 mmol/L (ref 98–110)
Creat: 0.59 mg/dL (ref 0.50–1.05)
Globulin: 2.2 g/dL (calc) (ref 1.9–3.7)
Glucose, Bld: 99 mg/dL (ref 65–99)
Potassium: 4.3 mmol/L (ref 3.5–5.3)
Sodium: 140 mmol/L (ref 135–146)
Total Bilirubin: 0.5 mg/dL (ref 0.2–1.2)
Total Protein: 6.3 g/dL (ref 6.1–8.1)
eGFR: 101 mL/min/{1.73_m2} (ref >=60)

## 2021-08-15 LAB — LIPID PANEL
Cholesterol: 120 mg/dL (ref <200)
HDL: 61 mg/dL (ref >=50)
LDL Cholesterol (Calc): 44 mg/dL (calc)
Non-HDL Cholesterol (Calc): 59 mg/dL (calc) (ref <130)
Total CHOL/HDL Ratio: 2 (calc) (ref <5.0)
Triglycerides: 69 mg/dL (ref <150)

## 2021-08-15 LAB — HEMOGLOBIN A1C
Hgb A1c MFr Bld: 6.3 % of total Hgb — ABNORMAL HIGH (ref <5.7)
Mean Plasma Glucose: 134 mg/dL
eAG (mmol/L): 7.4 mmol/L

## 2021-08-15 LAB — HIV ANTIBODY (ROUTINE TESTING W REFLEX): HIV 1&2 Ab, 4th Generation: NONREACTIVE

## 2021-08-15 LAB — TSH: TSH: 2.38 mIU/L (ref 0.40–4.50)

## 2021-08-15 LAB — HEPATITIS C ANTIBODY
Hepatitis C Ab: NONREACTIVE
SIGNAL TO CUT-OFF: 0.09 (ref <1.00)

## 2021-08-15 NOTE — Telephone Encounter (Addendum)
Oral Chemotherapy Pharmacist Encounter ? ?I spoke with patient for overview of: Verzenio for the treatment of hormone-receptor positive breast cancer, in combination with anastrozole, planned duration until disease progression or unacceptable toxicity.  ? ?Counseled patient on administration, dosing, side effects, monitoring, drug-food interactions, safe handling, storage, and disposal. ? ?Patient will take Verzenio '100mg'$  tablets, 1 tablet by mouth twice daily without regard to food. ? ?Patient knows to avoid grapefruit and grapefruit juice. ? ?Verzenio start date: 08/15/2021 ? ?Adverse effects include but are not limited to: diarrhea, fatigue, nausea, abdominal pain, decreased blood counts, and increased liver function tests, and joint pains. ?Severe, life-threatening, and/or fatal interstitial lung disease (ILD) and/or pneumonitis may occur with CDK 4/6 inhibitors. ? ?Patient has anti-emetic on hand and knows to take it if nausea develops.   ?Patient will obtain anti diarrheal and alert the office of 4 or more loose stools above baseline. ? ?Reviewed with patient importance of keeping a medication schedule and plan for any missed doses. No barriers to medication adherence identified. ? ?Medication reconciliation performed and medication/allergy list updated. ? ?Ship broker for [employer] has been obtained. ?Patient will receive medication from [facility] patient assistance program. ? ?Patient informed the pharmacy will reach out 5-7 days prior to needing next fill of Verzenio to coordinate continued medication acquisition to prevent break in therapy. ? ?All questions answered. ? ?[name] [name] voiced understanding and appreciation.  ? ?Medication education handout placed in mail for patient. Patient knows to call the office with questions or concerns. Oral Chemotherapy Clinic phone number provided to patient.  ? ?Drema Halon, PharmD ?Hematology/Oncology Clinical Pharmacist ?[facility] ?[phone] ?08/15/2021   8:59 AM ? ?

## 2021-08-20 ENCOUNTER — Other Ambulatory Visit: Payer: No Typology Code available for payment source

## 2021-08-21 ENCOUNTER — Other Ambulatory Visit: Payer: Self-pay

## 2021-08-21 DIAGNOSIS — I10 Essential (primary) hypertension: Secondary | ICD-10-CM

## 2021-08-21 DIAGNOSIS — E785 Hyperlipidemia, unspecified: Secondary | ICD-10-CM

## 2021-08-21 DIAGNOSIS — E1142 Type 2 diabetes mellitus with diabetic polyneuropathy: Secondary | ICD-10-CM

## 2021-08-21 DIAGNOSIS — K2100 Gastro-esophageal reflux disease with esophagitis, without bleeding: Secondary | ICD-10-CM

## 2021-08-21 DIAGNOSIS — K219 Gastro-esophageal reflux disease without esophagitis: Secondary | ICD-10-CM

## 2021-09-03 ENCOUNTER — Encounter: Payer: Self-pay | Admitting: Hematology and Oncology

## 2021-09-03 ENCOUNTER — Other Ambulatory Visit: Payer: Self-pay

## 2021-09-03 ENCOUNTER — Other Ambulatory Visit: Payer: No Typology Code available for payment source

## 2021-09-03 ENCOUNTER — Inpatient Hospital Stay: Payer: No Typology Code available for payment source | Attending: Hematology and Oncology

## 2021-09-03 ENCOUNTER — Inpatient Hospital Stay (HOSPITAL_BASED_OUTPATIENT_CLINIC_OR_DEPARTMENT_OTHER): Payer: No Typology Code available for payment source | Admitting: Hematology and Oncology

## 2021-09-03 ENCOUNTER — Other Ambulatory Visit: Payer: Self-pay | Admitting: *Deleted

## 2021-09-03 VITALS — BP 127/82 | HR 64 | Temp 97.5°F | Resp 18 | Ht 66.0 in | Wt 210.0 lb

## 2021-09-03 DIAGNOSIS — Z7984 Long term (current) use of oral hypoglycemic drugs: Secondary | ICD-10-CM | POA: Insufficient documentation

## 2021-09-03 DIAGNOSIS — C50411 Malignant neoplasm of upper-outer quadrant of right female breast: Secondary | ICD-10-CM | POA: Insufficient documentation

## 2021-09-03 DIAGNOSIS — Z79899 Other long term (current) drug therapy: Secondary | ICD-10-CM | POA: Insufficient documentation

## 2021-09-03 DIAGNOSIS — Z79811 Long term (current) use of aromatase inhibitors: Secondary | ICD-10-CM | POA: Insufficient documentation

## 2021-09-03 DIAGNOSIS — Z170 Estrogen receptor positive status [ER+]: Secondary | ICD-10-CM | POA: Insufficient documentation

## 2021-09-03 DIAGNOSIS — Z7982 Long term (current) use of aspirin: Secondary | ICD-10-CM | POA: Insufficient documentation

## 2021-09-03 DIAGNOSIS — R109 Unspecified abdominal pain: Secondary | ICD-10-CM

## 2021-09-03 DIAGNOSIS — Z923 Personal history of irradiation: Secondary | ICD-10-CM | POA: Insufficient documentation

## 2021-09-03 LAB — CBC WITH DIFFERENTIAL (CANCER CENTER ONLY)
Abs Immature Granulocytes: 0.01 10*3/uL (ref 0.00–0.07)
Basophils Absolute: 0 10*3/uL (ref 0.0–0.1)
Basophils Relative: 1 %
Eosinophils Absolute: 0.1 10*3/uL (ref 0.0–0.5)
Eosinophils Relative: 2 %
HCT: 33.5 % — ABNORMAL LOW (ref 36.0–46.0)
Hemoglobin: 10.8 g/dL — ABNORMAL LOW (ref 12.0–15.0)
Immature Granulocytes: 0 %
Lymphocytes Relative: 39 %
Lymphs Abs: 1.6 10*3/uL (ref 0.7–4.0)
MCH: 29 pg (ref 26.0–34.0)
MCHC: 32.2 g/dL (ref 30.0–36.0)
MCV: 89.8 fL (ref 80.0–100.0)
Monocytes Absolute: 0.2 10*3/uL (ref 0.1–1.0)
Monocytes Relative: 5 %
Neutro Abs: 2.2 10*3/uL (ref 1.7–7.7)
Neutrophils Relative %: 53 %
Platelet Count: 148 10*3/uL — ABNORMAL LOW (ref 150–400)
RBC: 3.73 MIL/uL — ABNORMAL LOW (ref 3.87–5.11)
RDW: 15.7 % — ABNORMAL HIGH (ref 11.5–15.5)
WBC Count: 4.1 10*3/uL (ref 4.0–10.5)
nRBC: 0 % (ref 0.0–0.2)

## 2021-09-03 LAB — CMP (CANCER CENTER ONLY)
ALT: 53 U/L — ABNORMAL HIGH (ref 0–44)
AST: 54 U/L — ABNORMAL HIGH (ref 15–41)
Albumin: 4.1 g/dL (ref 3.5–5.0)
Alkaline Phosphatase: 83 U/L (ref 38–126)
Anion gap: 5 (ref 5–15)
BUN: 10 mg/dL (ref 8–23)
CO2: 27 mmol/L (ref 22–32)
Calcium: 9.3 mg/dL (ref 8.9–10.3)
Chloride: 103 mmol/L (ref 98–111)
Creatinine: 0.91 mg/dL (ref 0.44–1.00)
GFR, Estimated: >60 mL/min (ref >60)
Glucose, Bld: 104 mg/dL — ABNORMAL HIGH (ref 70–99)
Potassium: 5.2 mmol/L — ABNORMAL HIGH (ref 3.5–5.1)
Sodium: 135 mmol/L (ref 135–145)
Total Bilirubin: 0.3 mg/dL (ref 0.3–1.2)
Total Protein: 6.5 g/dL (ref 6.5–8.1)

## 2021-09-03 NOTE — Progress Notes (Signed)
?[facility]  ?[phone] [phone]  ? ? ? ?ID: [name] [name] DOB: Jul 25, 1955  MR#: [id number]  CSN#:[id number] ? ?Patient Care Team: ?Ngetich, Nelda Bucks, NP as PCP - General (Family Medicine) ?Mauro Kaufmann, RN as Oncology Nurse Navigator ?Rockwell Germany, RN as Oncology Nurse Navigator ?Erroll Luna, MD as Consulting Physician (General Surgery) ?Eppie Gibson, MD as Attending Physician (Radiation Oncology) ?Benay Pike, MD as Consulting Physician (Hematology and Oncology) ?Benay Pike, MD ?OTHER MD: ? ?CHIEF COMPLAINT: Estrogen receptor positive breast cancer ? ?CURRENT TREATMENT: Anastrozole; considering abemaciclib ? ?INTERVAL HISTORY: ? ?[name] returns today for follow up of her estrogen receptor positive breast cancer.  ?She is tolerating anastrozole reasonably well.  She has noticed diffuse musculoskeletal pains lately although she is very reluctant to switch to tamoxifen.  She states the hot flashes are also bothersome at times.  She is very motivated to complete all the treatment that she is going to need.  She is willing to try abemaciclib as recommended by Dr. Jana Hakim. ?Rest of the pertinent 10 point ROS reviewed and negative. ? ? ? COVID 19 VACCINATION STATUS: s/p  [employer]  X2, most recently June 2021, no booster as of 05/01/2020 ? ? ?HISTORY OF CURRENT ILLNESS: ?From the original intake note: ? ?[name] [name] herself palpated a mass in the right axilla "a long time ago", more recently with complaints of associated pain. She brought it to medical attention and underwent bilateral diagnostic mammography with tomography and bilateral breast ultrasonography at [facility] on 01/31/2020 showing: breast density category B; three right breast masses-- 1.9 cm at 12 o'clock, 2.3 cm at 11 o'clock, 0.4 cm at 1 o'clock; two adjacent enlarged thickened lymph nodes measuring up to 2.1 cm; no evidence of left breast malignancy. ? ?Accordingly on 02/03/2020 she  proceeded to biopsy of the right breast area at 12 o'clock and right axilla. The pathology from this procedure ([id number]) showed: invasive ductal carcinoma, grade 3. Prognostic indicators significant for: estrogen receptor, 100% positive with strong staining intensity and progesterone receptor, 0% negative. Proliferation marker Ki67 at 60%. HER2 equivocal by immunohistochemistry (2+), but negative by fluorescent in situ hybridization with a signals ratio 0.81 and number per cell 2.7. ? ?The biopsied lymph node was found to show metastatic carcinoma. The morphology was considered different from the biopsied mass, and a prognostic panel was performed. Estrogen receptor, 100% positive with strong staining intensity and progesterone receptor, 0% negative. Proliferation marker Ki67 at 70%. HER2 equivcoal by immunohistochemistry (2+), but negative) by fluorescent in situ hybridization with a signals ratio 1.16 and number per cell 3.9. ? ?She underwent additional right breast biopsies on 02/06/2020. Pathology [id number]) from the mass at 11 o'clock showed: invasive ductal carcinoma, grade 2. Prognostic indicators significant for: estrogen receptor, 100% positive with strong staining intensity and progesterone receptor, 0% negative. Proliferation marker Ki67 at 15%. HER2 equivocal by immunohistochemistry (2+), but negative by fluorescent in situ hybridization with a signals ratio 1.29 and number per cell 2.7. ? ?The biopsied mass at 1 o'clock showed only fibrocystic change. ? ?The patient's subsequent history is as detailed below. ? ? ?PAST MEDICAL HISTORY: ?Past Medical History:  ?Diagnosis Date  ? Arthritis   ? Breast cancer ([place])   ? Cancer [facility])   ? breast  ? Diabetes mellitus without complication ([place])   ? History of MRI   ? Knee, Hip, and Back.  ? Hypertension   ? Personal history of chemotherapy   ? Personal history of radiation  therapy   ? ? ?PAST SURGICAL HISTORY: ?Past Surgical History:  ?Procedure Laterality  Date  ? BREAST BIOPSY Right 02/03/2020  ? x2  ? BREAST BIOPSY Right 02/06/2020  ? x2  ? BREAST LUMPECTOMY Right 07/26/2020  ? BREAST LUMPECTOMY WITH RADIOACTIVE SEED AND SENTINEL LYMPH NODE BIOPSY Right 07/26/2020  ? Procedure: RIGHT BREAST LUMPECTOMY WITH RADIOACTIVE SEED X 2 AND SENTINEL LYMPH NODE BIOPSY;  Surgeon: Erroll Luna, MD;  Location: [facility];  Service: General;  Laterality: Right;  ? IR IMAGING GUIDED PORT INSERTION  02/23/2020  ? PORT-A-CATH REMOVAL N/A 07/26/2020  ? Procedure: REMOVAL PORT-A-CATH;  Surgeon: Erroll Luna, MD;  Location: [facility];  Service: General;  Laterality: N/A;  ? ? ?FAMILY HISTORY: ?Family History  ?Problem Relation Age of Onset  ? Asthma Mother   ? Arthritis Sister   ? Asthma Brother   ? Arthritis Brother   ?[name] died in his 70s from causes unknown to the patient.  [name] died from heart disease at age 64.  The patient had 4 brothers, 1 sister, with no history of cancer in the family to her knowledge ? ? ?GYNECOLOGIC HISTORY:  ?No LMP recorded. Patient is postmenopausal. ?Menarche: 66 years old ?Age at first live birth: 66 years old ?GX P 1 ?LMP 80s ?Contraceptive ?HRT no  ?Hysterectomy? no ?BSO? no ? ? ?SOCIAL HISTORY: (updated 02/2020)  ?[name] is originally from [place].  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  She describes herself as single.  At home she lives with her daughter [name] [name] who works for [employer], her daughters' husband [name] [name] who is a English as a second language teacher, and their children who are 92 months old and 64 years old.  The patient is [name] [name] ?  ? ADVANCED DIRECTIVES: Not in place.  The patient tells me she would intend to name her daughter [name] as her healthcare power of attorney ? ? ?HEALTH MAINTENANCE: ?Social History  ? ?Tobacco Use  ? Smoking status: Never  ? Smokeless tobacco: Never  ?Vaping Use  ? Vaping Use: Never used  ?Substance Use Topics  ? Alcohol use: Yes  ?   Alcohol/week: 1.0 standard drink  ?  Types: 1 Glasses of wine per week  ?  Comment: occasionally  ? Drug use: Never  ? ? ? Colonoscopy: never done ? PAP: 2017 (performed in [place]) ? Bone density:  ?  ?Allergies  ?Allergen Reactions  ? Sulfa Antibiotics Itching  ? ? ?Current Outpatient Medications  ?Medication Sig Dispense Refill  ? abemaciclib (VERZENIO) 100 MG tablet Take 1 tablet (100 mg total) by mouth 2 (two) times daily. Swallow tablets whole. Do not chew, crush, or split tablets before swallowing. 60 tablet 1  ? acetaminophen (TYLENOL) 500 MG tablet Take 500 mg by mouth 3 (three) times daily as needed.    ? anastrozole (ARIMIDEX) 1 MG tablet Take 1 tablet (1 mg total) by mouth daily. 90 tablet 4  ? aspirin 81 MG chewable tablet Chew 81 mg by mouth daily.    ? atorvastatin (LIPITOR) 40 MG tablet Take 1 tablet (40 mg total) by mouth daily. 90 tablet 1  ? carvedilol (COREG) 6.25 MG tablet Take 6.25 mg by mouth 2 (two) times daily with a meal.    ? Garlic 1700 MG CAPS Take 1 capsule by mouth daily.    ? lansoprazole (PREVACID) 30 MG capsule Take 30 mg by mouth every morning.    ?  Magnesium 100 MG CAPS Take 1 capsule by mouth 2 (two) times daily.    ? meloxicam (MOBIC) 15 MG tablet Take 15 mg by mouth daily.    ? metFORMIN (GLUCOPHAGE) 1000 MG tablet Take 1,000 mg by mouth 2 (two) times daily with a meal.    ? Multiple Vitamins-Minerals (CENTRUM SILVER 50+WOMEN PO) Take 1 capsule by mouth daily.    ? valsartan (DIOVAN) 320 MG tablet Take 320 mg by mouth daily.    ? vitamin C (ASCORBIC ACID) 500 MG tablet Take 500 mg by mouth daily.    ? ?No current facility-administered medications for this visit.  ? ? ?OBJECTIVE: African-American woman in no acute distress ? ?Vitals:  ? 09/03/21 1600  ?BP: 127/82  ?Pulse: 64  ?Resp: 18  ?Temp: (!) 97.5 ?F (36.4 ?C)  ?SpO2: 99%  ? ?   Body mass index is 33.89 kg/m?.   ?Wt Readings from Last 3 Encounters:  ?09/03/21 210 lb (95.3 kg)  ?08/06/21 205 lb 6.4 oz (93.2 kg)   ?08/05/21 205 lb 6.4 oz (93.2 kg)  ? ?  ECOG FS:1 - Symptomatic but completely ambulatory ? ?Physical Exam ?Constitutional:   ?   Appearance: Normal appearance.  ?Cardiovascular:  ?   Rate and Rhythm: Normal rate and reg

## 2021-09-03 NOTE — Progress Notes (Signed)
?[place]  ?[phone] [phone]  ? ? ? ?ID: Benita Boonstra DOB: April 14, 1956  MR#: [id number]  CSN#:[id number] ? ?Patient Care Team: ?Ngetich, Nelda Bucks, NP as PCP - General (Family Medicine) ?Mauro Kaufmann, RN as Oncology Nurse Navigator ?Rockwell Germany, RN as Oncology Nurse Navigator ?Erroll Luna, MD as Consulting Physician (General Surgery) ?Eppie Gibson, MD as Attending Physician (Radiation Oncology) ?Benay Pike, MD as Consulting Physician (Hematology and Oncology) ?Benay Pike, MD ?OTHER MD: ? ?CHIEF COMPLAINT: Estrogen receptor positive breast cancer ? ?CURRENT TREATMENT: Anastrozole; considering abemaciclib ? ?INTERVAL HISTORY: ? ?[name] returns today for follow up of her estrogen receptor positive breast cancer.  ?She is tolerating anastrozole reasonably well, has diffuse bone aches and hot flashes. ?She is however reluctant to switch. She is using meloxicam/tylenol once or twice for bone pains. ?She is taking verzenio 100 mg po BID. ?No nausea or vomiting, diarrhea ?She has noticed some epigastric abdominal pain today, just started after eating day. ?Last bowel movement this morning.  No fevers or chills.  She says abdominal pain is making her uncomfortable but she did not have any such episodes of pain in the past few days. ? ?Rest of the pertinent 10 point ROS reviewed and negative. ? ? ? COVID 19 VACCINATION STATUS: s/p  [employer]  X2, most recently June 2021, no booster as of 05/01/2020 ? ? ?HISTORY OF CURRENT ILLNESS: ?From the original intake note: ? ?[name] [name] herself palpated a mass in the right axilla "a long time ago", more recently with complaints of associated pain. She brought it to medical attention and underwent bilateral diagnostic mammography with tomography and bilateral breast ultrasonography at [facility] on 01/31/2020 showing: breast density category B; three right breast masses-- 1.9 cm at 12 o'clock, 2.3 cm at 11 o'clock, 0.4  cm at 1 o'clock; two adjacent enlarged thickened lymph nodes measuring up to 2.1 cm; no evidence of left breast malignancy. ? ?Accordingly on 02/03/2020 she proceeded to biopsy of the right breast area at 12 o'clock and right axilla. The pathology from this procedure ([id number]) showed: invasive ductal carcinoma, grade 3. Prognostic indicators significant for: estrogen receptor, 100% positive with strong staining intensity and progesterone receptor, 0% negative. Proliferation marker Ki67 at 60%. HER2 equivocal by immunohistochemistry (2+), but negative by fluorescent in situ hybridization with a signals ratio 0.81 and number per cell 2.7. ? ?The biopsied lymph node was found to show metastatic carcinoma. The morphology was considered different from the biopsied mass, and a prognostic panel was performed. Estrogen receptor, 100% positive with strong staining intensity and progesterone receptor, 0% negative. Proliferation marker Ki67 at 70%. HER2 equivcoal by immunohistochemistry (2+), but negative) by fluorescent in situ hybridization with a signals ratio 1.16 and number per cell 3.9. ? ?She underwent additional right breast biopsies on 02/06/2020. Pathology [id number]) from the mass at 11 o'clock showed: invasive ductal carcinoma, grade 2. Prognostic indicators significant for: estrogen receptor, 100% positive with strong staining intensity and progesterone receptor, 0% negative. Proliferation marker Ki67 at 15%. HER2 equivocal by immunohistochemistry (2+), but negative by fluorescent in situ hybridization with a signals ratio 1.29 and number per cell 2.7. ? ?The biopsied mass at 1 o'clock showed only fibrocystic change. ? ?The patient's subsequent history is as detailed below. ? ? ?PAST MEDICAL HISTORY: ?Past Medical History:  ?Diagnosis Date  ? Arthritis   ? Breast cancer ([place])   ? Cancer [facility])   ? breast  ? Diabetes mellitus without complication ([place])   ?  History of MRI   ? Knee, Hip, and Back.  ? Hypertension    ? Personal history of chemotherapy   ? Personal history of radiation therapy   ? ? ?PAST SURGICAL HISTORY: ?Past Surgical History:  ?Procedure Laterality Date  ? BREAST BIOPSY Right 02/03/2020  ? x2  ? BREAST BIOPSY Right 02/06/2020  ? x2  ? BREAST LUMPECTOMY Right 07/26/2020  ? BREAST LUMPECTOMY WITH RADIOACTIVE SEED AND SENTINEL LYMPH NODE BIOPSY Right 07/26/2020  ? Procedure: RIGHT BREAST LUMPECTOMY WITH RADIOACTIVE SEED X 2 AND SENTINEL LYMPH NODE BIOPSY;  Surgeon: Erroll Luna, MD;  Location: [place];  Service: General;  Laterality: Right;  ? IR IMAGING GUIDED PORT INSERTION  02/23/2020  ? PORT-A-CATH REMOVAL N/A 07/26/2020  ? Procedure: REMOVAL PORT-A-CATH;  Surgeon: Erroll Luna, MD;  Location: [place];  Service: General;  Laterality: N/A;  ? ? ?FAMILY HISTORY: ?Family History  ?Problem Relation Age of Onset  ? Asthma Mother   ? Arthritis Sister   ? Asthma Brother   ? Arthritis Brother   ?[name] died in his 16s from causes unknown to the patient.  [name] died from heart disease at age 64.  The patient had 4 brothers, 1 sister, with no history of cancer in the family to her knowledge ? ? ?GYNECOLOGIC HISTORY:  ?No LMP recorded. Patient is postmenopausal. ?Menarche: 66 years old ?Age at first live birth: 66 years old ?GX P 1 ?LMP 28s ?Contraceptive ?HRT no  ?Hysterectomy? no ?BSO? no ? ? ?SOCIAL HISTORY: (updated 02/2020)  ?[name] is originally from [place].  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  She describes herself as single.  At home she lives with her daughter [name] [name] who works for [employer], her daughters' husband [name] who is a English as a second language teacher, and their children who are 72 months old and 57 years old.  The patient is [name] [name] ?  ? ADVANCED DIRECTIVES: Not in place.  The patient tells me she would intend to name her daughter [name] [name] as her healthcare power of attorney ? ? ?HEALTH MAINTENANCE: ?Social History   ? ?Tobacco Use  ? Smoking status: Never  ? Smokeless tobacco: Never  ?Vaping Use  ? Vaping Use: Never used  ?Substance Use Topics  ? Alcohol use: Yes  ?  Alcohol/week: 1.0 standard drink  ?  Types: 1 Glasses of wine per week  ?  Comment: occasionally  ? Drug use: Never  ? ? ? Colonoscopy: never done ? PAP: 2017 (performed in [place]) ? Bone density:  ?  ?Allergies  ?Allergen Reactions  ? Sulfa Antibiotics Itching  ? ? ?Current Outpatient Medications  ?Medication Sig Dispense Refill  ? abemaciclib (VERZENIO) 100 MG tablet Take 1 tablet (100 mg total) by mouth 2 (two) times daily. Swallow tablets whole. Do not chew, crush, or split tablets before swallowing. 60 tablet 1  ? acetaminophen (TYLENOL) 500 MG tablet Take 500 mg by mouth 3 (three) times daily as needed.    ? anastrozole (ARIMIDEX) 1 MG tablet Take 1 tablet (1 mg total) by mouth daily. 90 tablet 4  ? aspirin 81 MG chewable tablet Chew 81 mg by mouth daily.    ? atorvastatin (LIPITOR) 40 MG tablet Take 1 tablet (40 mg total) by mouth daily. 90 tablet 1  ? carvedilol (COREG) 6.25 MG tablet Take 6.25 mg by mouth 2 (two) times daily with a meal.    ? Garlic 7893  MG CAPS Take 1 capsule by mouth daily.    ? lansoprazole (PREVACID) 30 MG capsule Take 30 mg by mouth every morning.    ? Magnesium 100 MG CAPS Take 1 capsule by mouth 2 (two) times daily.    ? meloxicam (MOBIC) 15 MG tablet Take 15 mg by mouth daily.    ? metFORMIN (GLUCOPHAGE) 1000 MG tablet Take 1,000 mg by mouth 2 (two) times daily with a meal.    ? Multiple Vitamins-Minerals (CENTRUM SILVER 50+WOMEN PO) Take 1 capsule by mouth daily.    ? valsartan (DIOVAN) 320 MG tablet Take 320 mg by mouth daily.    ? vitamin C (ASCORBIC ACID) 500 MG tablet Take 500 mg by mouth daily.    ? ?No current facility-administered medications for this visit.  ? ? ?OBJECTIVE: African-American woman in no acute distress ? ?Vitals:  ? 09/03/21 1600  ?BP: 127/82  ?Pulse: 64  ?Resp: 18  ?Temp: (!) 97.5 ?F (36.4 ?C)  ?SpO2:  99%  ? ?   Body mass index is 33.89 kg/m?.   ?Wt Readings from Last 3 Encounters:  ?09/03/21 210 lb (95.3 kg)  ?08/06/21 205 lb 6.4 oz (93.2 kg)  ?08/05/21 205 lb 6.4 oz (93.2 kg)  ? ?  ECOG FS:1 - Sympt

## 2021-09-04 ENCOUNTER — Telehealth: Payer: Self-pay | Admitting: Hematology and Oncology

## 2021-09-04 NOTE — Telephone Encounter (Signed)
Scheduled appointment per 04/25 los. Patient aware.  ?

## 2021-09-24 MED ORDER — MELOXICAM 15 MG PO TABS: 15.0000 mg | ORAL_TABLET | Freq: Every day | ORAL | 1 refills | Status: DC

## 2021-09-24 MED ORDER — METFORMIN HCL 1000 MG PO TABS: 1000.0000 mg | ORAL_TABLET | Freq: Two times a day (BID) | ORAL | 1 refills | Status: DC

## 2021-09-24 NOTE — Telephone Encounter (Signed)
Pended Rx and sent to [facility] for approval.  ?

## 2021-09-25 ENCOUNTER — Encounter (HOSPITAL_COMMUNITY): Payer: Self-pay

## 2021-09-30 ENCOUNTER — Other Ambulatory Visit: Payer: Self-pay | Admitting: *Deleted

## 2021-09-30 ENCOUNTER — Inpatient Hospital Stay: Payer: No Typology Code available for payment source | Admitting: Pharmacist

## 2021-09-30 ENCOUNTER — Other Ambulatory Visit: Payer: Self-pay | Admitting: Pharmacist

## 2021-09-30 ENCOUNTER — Inpatient Hospital Stay: Payer: No Typology Code available for payment source | Attending: Hematology and Oncology

## 2021-09-30 VITALS — BP 104/63 | HR 75 | Temp 97.7°F | Resp 18 | Ht 66.0 in | Wt 213.0 lb

## 2021-09-30 DIAGNOSIS — Z170 Estrogen receptor positive status [ER+]: Secondary | ICD-10-CM

## 2021-09-30 DIAGNOSIS — Z923 Personal history of irradiation: Secondary | ICD-10-CM | POA: Insufficient documentation

## 2021-09-30 DIAGNOSIS — C50411 Malignant neoplasm of upper-outer quadrant of right female breast: Secondary | ICD-10-CM | POA: Insufficient documentation

## 2021-09-30 DIAGNOSIS — Z79811 Long term (current) use of aromatase inhibitors: Secondary | ICD-10-CM | POA: Insufficient documentation

## 2021-09-30 LAB — CBC WITH DIFFERENTIAL (CANCER CENTER ONLY)
Abs Immature Granulocytes: 0.01 10*3/uL (ref 0.00–0.07)
Basophils Absolute: 0 10*3/uL (ref 0.0–0.1)
Basophils Relative: 1 %
Eosinophils Absolute: 0.1 10*3/uL (ref 0.0–0.5)
Eosinophils Relative: 1 %
HCT: 30.7 % — ABNORMAL LOW (ref 36.0–46.0)
Hemoglobin: 10.4 g/dL — ABNORMAL LOW (ref 12.0–15.0)
Immature Granulocytes: 0 %
Lymphocytes Relative: 52 %
Lymphs Abs: 1.8 10*3/uL (ref 0.7–4.0)
MCH: 30.5 pg (ref 26.0–34.0)
MCHC: 33.9 g/dL (ref 30.0–36.0)
MCV: 90 fL (ref 80.0–100.0)
Monocytes Absolute: 0.2 10*3/uL (ref 0.1–1.0)
Monocytes Relative: 7 %
Neutro Abs: 1.4 10*3/uL — ABNORMAL LOW (ref 1.7–7.7)
Neutrophils Relative %: 39 %
Platelet Count: 221 10*3/uL (ref 150–400)
RBC: 3.41 MIL/uL — ABNORMAL LOW (ref 3.87–5.11)
RDW: 17.3 % — ABNORMAL HIGH (ref 11.5–15.5)
WBC Count: 3.5 10*3/uL — ABNORMAL LOW (ref 4.0–10.5)
nRBC: 0 % (ref 0.0–0.2)

## 2021-09-30 LAB — CMP (CANCER CENTER ONLY)
ALT: 18 U/L (ref 0–44)
AST: 19 U/L (ref 15–41)
Albumin: 4 g/dL (ref 3.5–5.0)
Alkaline Phosphatase: 81 U/L (ref 38–126)
Anion gap: 6 (ref 5–15)
BUN: 14 mg/dL (ref 8–23)
CO2: 28 mmol/L (ref 22–32)
Calcium: 9.1 mg/dL (ref 8.9–10.3)
Chloride: 103 mmol/L (ref 98–111)
Creatinine: 0.94 mg/dL (ref 0.44–1.00)
GFR, Estimated: >60 mL/min (ref >60)
Glucose, Bld: 111 mg/dL — ABNORMAL HIGH (ref 70–99)
Potassium: 5.1 mmol/L (ref 3.5–5.1)
Sodium: 137 mmol/L (ref 135–145)
Total Bilirubin: 0.2 mg/dL — ABNORMAL LOW (ref 0.3–1.2)
Total Protein: 6.7 g/dL (ref 6.5–8.1)

## 2021-09-30 MED ORDER — ABEMACICLIB 100 MG PO TABS: 100.0000 mg | ORAL_TABLET | Freq: Two times a day (BID) | ORAL | 1 refills | Status: DC

## 2021-09-30 NOTE — Progress Notes (Signed)
[name] [name]       Telephone: [phone]?Fax: [phone]   Oncology Clinical Pharmacist Practitioner Initial Assessment  [name] [name] is a 66 y.o. female with a diagnosis of breast cancer. They were contacted today via in person visit.  Indication/Regimen Abemaciclib (Verzenio) is being used appropriately for treatment of breast cancer by Dr. Arletha Pili Iruku.      Wt Readings from Last 1 Encounters:  09/30/21 213 lb (96.6 kg)    Estimated body surface area is 2.12 meters squared as calculated from the following:   Height as of this encounter: '5\' 6"'$  (1.676 m).   Weight as of this encounter: 213 lb (96.6 kg).  The dosing regimen is 100 mg by mouth every 12 hours on days 1 to 28 of a 28-day cycle. This is being given in combination with anastrozole. It is planned to continue until 2 years in the adjuvant setting per the [facility] trial data.  [name] was seen today to establish care with clinical pharmacy for her abemaciclib management.  She was referred by Dr. Chryl Heck.  She was last seen by Dr. Chryl Heck on 09/03/21.  She states that she started abemaciclib on 08/15/21.  So far she states she is tolerating it well.  Unfortunately, [facility] has not sent her next shipment of abemaciclib yet.  [name] [name] states that she took her last dose on Thursday (09/26/21).  We gave her the number to call for [facility] and she will call them this afternoon.  We have sent a new prescription to [facility] for her abemaciclib with 1 refill.  She plans to see Dr. Chryl Heck to on 10/30/21 and she will see clinical pharmacy again in 2 weeks.  At some point, if she continues to tolerate the abemaciclib well, we can consider increasing the dose. She does follow with her PCP Dr. Marlowe Sax who manages her other conditions.  Today we went over possible side effects of abemaciclib which include but are not limited to diarrhea, neutropenia, interstitial lung disease, liver toxicity, blood clots, fatigue, hair loss,  nausea, vomiting, and taste alterations.  We also went over storage and handling of the medication, to space the medication out 12 hours, and what to do should she miss a dose.  Dose Modifications Per Dr. Chryl Heck, abemaciclib 100 mg every 12 hours  Access Assessment [name] [name] will be receiving abemaciclib through [facility] Concerns: No Start date if known: 08/15/21  Allergies Allergies  Allergen Reactions   Sulfa Antibiotics Itching    Vitals    09/30/2021    3:06 PM 09/03/2021    4:00 PM 08/06/2021    3:09 PM  Vitals with BMI  Height '5\' 6"'$  '5\' 6"'$  '5\' 6"'$   Weight 213 lbs 210 lbs 205 lbs 6 oz  BMI 34.4 74.08 14.48  Systolic 185 631   Diastolic 63 82   Pulse 75 64      Laboratory Data    Latest Ref Rng & Units 09/30/2021    2:51 PM 09/03/2021    3:33 PM 08/14/2021    8:07 AM  CBC EXTENDED  WBC 4.0 - 10.5 K/uL 3.5   4.1   2.9    RBC 3.87 - 5.11 MIL/uL 3.41   3.73   4.07    Hemoglobin 12.0 - 15.0 g/dL 10.4   10.8   11.7    HCT 36.0 - 46.0 % 30.7   33.5   36.5    Platelets 150 - 400 K/uL 221  148   220    NEUT# 1.7 - 7.7 K/uL 1.4   2.2   1,172    Lymph# 0.7 - 4.0 K/uL 1.8   1.6   1,450         Latest Ref Rng & Units 09/30/2021    2:51 PM 09/03/2021    3:33 PM 08/14/2021    8:07 AM  CMP  Glucose 70 - 99 mg/dL 111   104   99    BUN 8 - 23 mg/dL '14   10   7    '$ Creatinine 0.44 - 1.00 mg/dL 0.94   0.91   0.59    Sodium 135 - 145 mmol/L 137   135   140    Potassium 3.5 - 5.1 mmol/L 5.1   5.2   4.3    Chloride 98 - 111 mmol/L 103   103   105    CO2 22 - 32 mmol/L '28   27   29    '$ Calcium 8.9 - 10.3 mg/dL 9.1   9.3   9.5    Total Protein 6.5 - 8.1 g/dL 6.7   6.5   6.3    Total Bilirubin 0.3 - 1.2 mg/dL 0.2   0.3   0.5    Alkaline Phos 38 - 126 U/L 81   83     AST 15 - 41 U/L 19   54   17    ALT 0 - 44 U/L 18   53   14     No results found for: MG   Contraindications Contraindications were reviewed?  Yes Contraindications to therapy were identified?  No  Safety  Precautions The following safety precautions for the use of abemaciclib were reviewed:  Diarrhea: we reviewed that diarrhea is common with abemaciclib and confirmed that she does have loperamide (Imodium) at home.  We reviewed how to take this medication PRN and gave her information on abemaciclib Neutropenia: we discussed the importance of having a thermometer and what the Centers for Disease Control and Prevention (CDC) considers a fever which is 100.108F (38C) or higher.  Gave patient 24/7 triage line to call if any fevers or symptoms ILD/Pneumonitis: we reviewed potential symptoms including cough, shortness, and fatigue. Hepatotoxicity: reviewed to contact clinic for RUQ pain that will not subside, yellowing of eyes/skin Venous thromboembolism (VTE): reviewed signs of deep vein thrombosis (DVT) such as leg swelling, redness, pain, or tenderness and signs of pulmonary embolism (PE) such as shortness of breath, rapid or irregular heartbeat, cough, chest pain, or lightheadedness Reviewed to take the medication every 12 hours (with food sometimes can be easier on the stomach) and to take it at the same time every day. Discussed proper storage and handling of abemaciclib Missed doses  Medication Reconciliation Current Outpatient Medications  Medication Sig Dispense Refill   abemaciclib (VERZENIO) 100 MG tablet Take 1 tablet (100 mg total) by mouth 2 (two) times daily. Swallow tablets whole. Do not chew, crush, or split tablets before swallowing. 56 tablet 1   acetaminophen (TYLENOL) 500 MG tablet Take 500 mg by mouth 3 (three) times daily as needed.     anastrozole (ARIMIDEX) 1 MG tablet Take 1 tablet (1 mg total) by mouth daily. 90 tablet 4   aspirin 81 MG chewable tablet Chew 81 mg by mouth daily.     atorvastatin (LIPITOR) 40 MG tablet Take 1 tablet (40 mg total) by mouth daily. 90 tablet 1   carvedilol (COREG) 6.25 MG tablet  Take 6.25 mg by mouth 2 (two) times daily with a meal.     Garlic 7680  MG CAPS Take 1 capsule by mouth daily.     lansoprazole (PREVACID) 30 MG capsule Take 30 mg by mouth every morning.     Magnesium 100 MG CAPS Take 1 capsule by mouth 2 (two) times daily.     meloxicam (MOBIC) 15 MG tablet Take 1 tablet (15 mg total) by mouth daily. 90 tablet 1   metFORMIN (GLUCOPHAGE) 1000 MG tablet Take 1 tablet (1,000 mg total) by mouth 2 (two) times daily with a meal. 180 tablet 1   Multiple Vitamins-Minerals (CENTRUM SILVER 50+WOMEN PO) Take 1 capsule by mouth daily.     valsartan (DIOVAN) 320 MG tablet Take 320 mg by mouth daily.     vitamin C (ASCORBIC ACID) 500 MG tablet Take 500 mg by mouth daily.     No current facility-administered medications for this visit.    Medication reconciliation is based on the patient's most recent medication list in the electronic medical record (EMR) including herbal products and OTC medications.   The patient's medication list was reviewed today with the patient?  Yes  Drug-drug interactions (DDIs) DDIs were evaluated?  Yes Significant DDIs identified?  No  Drug-Food Interactions Drug-food interactions were evaluated?  Yes Drug-food interactions identified?  Yes, avoid grapefruit products  Follow-up Plan  Labs, pharmacy clinic, in 2 weeks New prescription sent for abemaciclib to [facility].  Ms. Karapetian will contact [facility] today to schedule shipment. Labs, Dr. Chryl Heck visit, on 10/30/21 May consider abemaciclib dose increase if she continues to tolerate.  ANC 1400 cells/L today.  Continue to monitor  [name] participated in the discussion, expressed understanding, and voiced agreement with the above plan. All questions were answered to her satisfaction. The patient was advised to contact the clinic at [phone] with any questions or concerns prior to her return visit.   I spent 45 minutes assessing the patient.  Raina Mina, RPH-CPP, 09/30/2021 3:49 PM  **Disclaimer: This note was dictated with voice  recognition software. Similar sounding words can inadvertently be transcribed and this note may contain transcription errors which may not have been corrected upon publication of note.**

## 2021-10-01 ENCOUNTER — Telehealth: Payer: Self-pay | Admitting: Pharmacist

## 2021-10-01 NOTE — Telephone Encounter (Signed)
Scheduled appointment per 05/22 los. Patient aware.  

## 2021-10-03 ENCOUNTER — Other Ambulatory Visit: Payer: Self-pay | Admitting: *Deleted

## 2021-10-03 DIAGNOSIS — C50411 Malignant neoplasm of upper-outer quadrant of right female breast: Secondary | ICD-10-CM

## 2021-10-03 DIAGNOSIS — Z79811 Long term (current) use of aromatase inhibitors: Secondary | ICD-10-CM

## 2021-10-03 DIAGNOSIS — Z780 Asymptomatic menopausal state: Secondary | ICD-10-CM

## 2021-10-15 ENCOUNTER — Other Ambulatory Visit: Payer: Self-pay | Admitting: *Deleted

## 2021-10-15 ENCOUNTER — Other Ambulatory Visit: Payer: Self-pay

## 2021-10-15 ENCOUNTER — Inpatient Hospital Stay: Payer: No Typology Code available for payment source | Admitting: Pharmacist

## 2021-10-15 ENCOUNTER — Inpatient Hospital Stay: Payer: No Typology Code available for payment source | Attending: Hematology and Oncology

## 2021-10-15 VITALS — BP 114/79 | HR 69 | Temp 97.9°F | Resp 16 | Ht 66.0 in | Wt 211.1 lb

## 2021-10-15 DIAGNOSIS — C50411 Malignant neoplasm of upper-outer quadrant of right female breast: Secondary | ICD-10-CM | POA: Insufficient documentation

## 2021-10-15 DIAGNOSIS — Z170 Estrogen receptor positive status [ER+]: Secondary | ICD-10-CM | POA: Insufficient documentation

## 2021-10-15 DIAGNOSIS — Z923 Personal history of irradiation: Secondary | ICD-10-CM | POA: Insufficient documentation

## 2021-10-15 DIAGNOSIS — Z780 Asymptomatic menopausal state: Secondary | ICD-10-CM | POA: Insufficient documentation

## 2021-10-15 DIAGNOSIS — Z79811 Long term (current) use of aromatase inhibitors: Secondary | ICD-10-CM | POA: Insufficient documentation

## 2021-10-15 DIAGNOSIS — F1090 Alcohol use, unspecified, uncomplicated: Secondary | ICD-10-CM | POA: Insufficient documentation

## 2021-10-15 DIAGNOSIS — Z9221 Personal history of antineoplastic chemotherapy: Secondary | ICD-10-CM | POA: Insufficient documentation

## 2021-10-15 LAB — CBC WITH DIFFERENTIAL (CANCER CENTER ONLY)
Abs Immature Granulocytes: 0.01 10*3/uL (ref 0.00–0.07)
Basophils Absolute: 0 10*3/uL (ref 0.0–0.1)
Basophils Relative: 1 %
Eosinophils Absolute: 0.1 10*3/uL (ref 0.0–0.5)
Eosinophils Relative: 2 %
HCT: 31.3 % — ABNORMAL LOW (ref 36.0–46.0)
Hemoglobin: 10.7 g/dL — ABNORMAL LOW (ref 12.0–15.0)
Immature Granulocytes: 0 %
Lymphocytes Relative: 41 %
Lymphs Abs: 1.4 10*3/uL (ref 0.7–4.0)
MCH: 31.6 pg (ref 26.0–34.0)
MCHC: 34.2 g/dL (ref 30.0–36.0)
MCV: 92.3 fL (ref 80.0–100.0)
Monocytes Absolute: 0.2 10*3/uL (ref 0.1–1.0)
Monocytes Relative: 5 %
Neutro Abs: 1.7 10*3/uL (ref 1.7–7.7)
Neutrophils Relative %: 51 %
Platelet Count: 214 10*3/uL (ref 150–400)
RBC: 3.39 MIL/uL — ABNORMAL LOW (ref 3.87–5.11)
RDW: 18.7 % — ABNORMAL HIGH (ref 11.5–15.5)
WBC Count: 3.4 10*3/uL — ABNORMAL LOW (ref 4.0–10.5)
nRBC: 0 % (ref 0.0–0.2)

## 2021-10-15 LAB — CMP (CANCER CENTER ONLY)
ALT: 13 U/L (ref 0–44)
AST: 15 U/L (ref 15–41)
Albumin: 4.1 g/dL (ref 3.5–5.0)
Alkaline Phosphatase: 76 U/L (ref 38–126)
Anion gap: 4 — ABNORMAL LOW (ref 5–15)
BUN: 14 mg/dL (ref 8–23)
CO2: 28 mmol/L (ref 22–32)
Calcium: 9.3 mg/dL (ref 8.9–10.3)
Chloride: 103 mmol/L (ref 98–111)
Creatinine: 1.06 mg/dL — ABNORMAL HIGH (ref 0.44–1.00)
GFR, Estimated: 59 mL/min — ABNORMAL LOW (ref >60)
Glucose, Bld: 143 mg/dL — ABNORMAL HIGH (ref 70–99)
Potassium: 4.7 mmol/L (ref 3.5–5.1)
Sodium: 135 mmol/L (ref 135–145)
Total Bilirubin: 0.3 mg/dL (ref 0.3–1.2)
Total Protein: 6.5 g/dL (ref 6.5–8.1)

## 2021-10-15 NOTE — Progress Notes (Signed)
[place]       Telephone: [phone]?Fax: [phone]   Oncology Clinical Pharmacist Practitioner Progress Note  [name] [name] was contacted via in person to discuss her chemotherapy regimen for abemaciclib which they receive under the care of Dr. Benay Pike.  Current treatment regimen and start date Abemaciclib (08/15/21) Anastrozole (10/31/20)  Interval History She continues on abemaciclib 100 mg by mouth every 12 hours on days 1 to 28 of a 28-day cycle. This is being given in combination with anastrozole. Therapy is planned to continue until 2 years in the adjuvant setting per the [facility] trial data.   Response to Therapy [name] [name] was seen today by clinical pharmacy as a follow-up to her abemaciclib management.  She was last seen by clinical pharmacy on 09/30/21 and Dr. Chryl Heck on 09/03/21.  Overall she is doing well.  Her [place] has increased from our last visit as well as her hemoglobin.  Her serum creatinine is slightly elevated today.  She does state that she has been drinking plenty of fluids.  Her only other concern is that she was reporting some dizziness.  We explained that this could be multifactorial and could include dehydration, or her blood pressure meds needing to be adjusted.  She reports that she does log her blood pressures at home and they have all been normal.  Today her blood pressure is also within normal limits.  She does follow with Dr. Kelby Fam who manages her blood pressure and diabetes medications.  We discussed that if the dizziness continues that she may want to reach out to his office and schedule a visit as there is a chance that her blood pressure medications may need to be adjusted.  She verbalized understanding of the plan.  The dizziness is less likely to be caused by abemaciclib or anastrozole.  She does report one missed a dose of abemaciclib but since then she states that she has been using the alarm on her phone and she has not missed any  doses since then.  She has an upcoming visit with Dr. Chryl Heck and labs on 10/30/21 and we will add labs and a pharmacy clinic visit on 11/27/21.  At that point, if she continues to tolerate abemaciclib, she will have labs in August and then can likely have labs and visits periodically per manufacturing guidelines.  Labs, vitals, treatment parameters, and manufacturer guidelines assessing toxicity were reviewed with [name] [name] today. Based on these values, patient is in agreement to continue abemaciclib therapy at this time.  Allergies Allergies  Allergen Reactions   Sulfa Antibiotics Itching    Vitals    10/15/2021    2:53 PM 09/30/2021    3:06 PM 09/03/2021    4:00 PM  Vitals with BMI  Height '5\' 6"'$  '5\' 6"'$  '5\' 6"'$   Weight 211 lbs 2 oz 213 lbs 210 lbs  BMI 34.09 55.7 32.20  Systolic 254 270 623  Diastolic 79 63 82  Pulse 69 75 64     Laboratory Data    Latest Ref Rng & Units 10/15/2021    2:31 PM 09/30/2021    2:51 PM 09/03/2021    3:33 PM  CBC EXTENDED  WBC 4.0 - 10.5 K/uL 3.4   3.5   4.1    RBC 3.87 - 5.11 MIL/uL 3.39   3.41   3.73    Hemoglobin 12.0 - 15.0 g/dL 10.7   10.4   10.8    HCT 36.0 - 46.0 % 31.3  30.7   33.5    Platelets 150 - 400 K/uL 214   221   148    NEUT# 1.7 - 7.7 K/uL 1.7   1.4   2.2    Lymph# 0.7 - 4.0 K/uL 1.4   1.8   1.6         Latest Ref Rng & Units 10/15/2021    2:31 PM 09/30/2021    2:51 PM 09/03/2021    3:33 PM  CMP  Glucose 70 - 99 mg/dL 143   111   104    BUN 8 - 23 mg/dL '14   14   10    '$ Creatinine 0.44 - 1.00 mg/dL 1.06   0.94   0.91    Sodium 135 - 145 mmol/L 135   137   135    Potassium 3.5 - 5.1 mmol/L 4.7   5.1   5.2    Chloride 98 - 111 mmol/L 103   103   103    CO2 22 - 32 mmol/L '28   28   27    '$ Calcium 8.9 - 10.3 mg/dL 9.3   9.1   9.3    Total Protein 6.5 - 8.1 g/dL 6.5   6.7   6.5    Total Bilirubin 0.3 - 1.2 mg/dL 0.3   0.2   0.3    Alkaline Phos 38 - 126 U/L 76   81   83    AST 15 - 41 U/L 15   19   54    ALT 0 - 44 U/L 13   18   53       No results found for: MG  Adverse Effects Assessment Dizziness: [name] [name] reports dizziness occasionally.  This may be due to dehydration.  Her creatinine is slightly elevated today but she does report drinking plenty of fluids.  This also could be from her blood pressure medications which are managed by Dr. Kelby Fam.  She reports her blood pressures been normal at home and she will continue to monitor and reach out to Dr. Kelby Fam if needed.  Less likely due from abemaciclib or anastrozole. Neutropenia: ANC has increased to 1700 cells/L.  We will continue to monitor. Anemia: Hemoglobin has increased to 10.7 g/dL.  We will continue to monitor.  Adherence Assessment [name] [name] reports missing 1 doses over the past 2 weeks.   Reason for missed dose: [name] [name] forgot to take her dose.  She has now started using the alarm on her phone and she has not missed another dose per her report. Patient was re-educated on importance of adherence.   Access Assessment [name] [name] is currently receiving her abemaciclib through [employer].  She has had no issues with shipment since the delay initially. Insurance concerns: None  Medication Reconciliation The patient's medication list was reviewed today with the patient?  Yes New medications or herbal supplements have recently been started?  No Any medications have been discontinued?  No The medication list was updated and reconciled based on the patient's most recent medication list in the electronic medical record (EMR) including herbal products and OTC medications.   Medications Current Outpatient Medications  Medication Sig Dispense Refill   abemaciclib (VERZENIO) 100 MG tablet Take 1 tablet (100 mg total) by mouth 2 (two) times daily. Swallow tablets whole. Do not chew, crush, or split tablets before swallowing. 56 tablet 1   acetaminophen (TYLENOL) 500 MG tablet Take 500 mg by mouth 3 (three) times  daily as needed.     anastrozole  (ARIMIDEX) 1 MG tablet Take 1 tablet (1 mg total) by mouth daily. 90 tablet 4   aspirin 81 MG chewable tablet Chew 81 mg by mouth daily.     atorvastatin (LIPITOR) 40 MG tablet Take 1 tablet (40 mg total) by mouth daily. 90 tablet 1   carvedilol (COREG) 6.25 MG tablet Take 6.25 mg by mouth 2 (two) times daily with a meal.     Garlic 6644 MG CAPS Take 1 capsule by mouth daily.     lansoprazole (PREVACID) 30 MG capsule Take 30 mg by mouth every morning.     Magnesium 100 MG CAPS Take 1 capsule by mouth 2 (two) times daily.     meloxicam (MOBIC) 15 MG tablet Take 1 tablet (15 mg total) by mouth daily. 90 tablet 1   metFORMIN (GLUCOPHAGE) 1000 MG tablet Take 1 tablet (1,000 mg total) by mouth 2 (two) times daily with a meal. 180 tablet 1   Multiple Vitamins-Minerals (CENTRUM SILVER 50+WOMEN PO) Take 1 capsule by mouth daily.     valsartan (DIOVAN) 320 MG tablet Take 320 mg by mouth daily.     vitamin C (ASCORBIC ACID) 500 MG tablet Take 500 mg by mouth daily.     No current facility-administered medications for this visit.    Drug-Drug Interactions (DDIs) DDIs were evaluated?  Yes Significant DDIs?  No The patient was instructed to speak with their health care provider and/or the oral chemotherapy pharmacist before starting any new drug, including prescription or over the counter, natural / herbal products, or vitamins.  Supportive Care Diarrhea: we reviewed that diarrhea is common with abemaciclib and confirmed that she does have loperamide (Imodium) at home.  We reviewed how to take this medication PRN Neutropenia: we discussed the importance of having a thermometer and what the Centers for Disease Control and Prevention (CDC) considers a fever which is 100.75F (38C) or higher.  Gave patient 24/7 triage line to call if any fevers or symptoms ILD/Pneumonitis: we reviewed potential symptoms including cough, shortness, and fatigue.  VTE: reviewed signs of DVT such as leg swelling, redness, pain,  or tenderness and signs of PE such as shortness of breath, rapid or irregular heartbeat, cough, chest pain, or lightheadedness Reviewed to take the medication every 12 hours (with food sometimes can be easier on the stomach) and to take it at the same time every day.  Dosing Assessment Hepatic adjustments needed?  No Renal adjustments needed?  No Toxicity adjustments needed?  No The current dosing regimen is appropriate to continue at this time.  Follow-Up Plan Continue abemaciclib 100 mg every 12 hours.  May increase dose in the future.  Would prefer to discuss this with Dr. Chryl Heck later this month or at her clinical pharmacy visit in July. Continue to monitor ANC and hemoglobin.  Both values increased from last visit. Continue to follow with Dr. Kelby Fam for blood pressure and diabetes management.  [name] [name] may reach out to Dr. Kelby Fam if her dizziness continues.  Although blood pressure is normal today and she reports normal readings at home, her medications may need to be adjusted.  Less likely from abemaciclib or anastrozole. Labs, Dr. Chryl Heck visit, on 10/30/21.   Labs, pharmacy clinic visit, on 11/27/21  [name] [name] participated in the discussion, expressed understanding, and voiced agreement with the above plan. All questions were answered to her satisfaction. The patient was advised to contact the clinic at [phone] with any  questions or concerns prior to her return visit.   I spent 30 minutes assessing and educating the patient.  Raina Mina, RPH-CPP, 10/15/2021  3:57 PM   **Disclaimer: This note was dictated with voice recognition software. Similar sounding words can inadvertently be transcribed and this note may contain transcription errors which may not have been corrected upon publication of note.**

## 2021-10-22 ENCOUNTER — Other Ambulatory Visit: Payer: Self-pay | Admitting: *Deleted

## 2021-10-22 ENCOUNTER — Encounter: Payer: Self-pay | Admitting: Hematology and Oncology

## 2021-10-22 DIAGNOSIS — Z1231 Encounter for screening mammogram for malignant neoplasm of breast: Secondary | ICD-10-CM

## 2021-10-23 ENCOUNTER — Other Ambulatory Visit: Payer: Self-pay

## 2021-10-23 DIAGNOSIS — Z79811 Long term (current) use of aromatase inhibitors: Secondary | ICD-10-CM

## 2021-10-23 DIAGNOSIS — Z1231 Encounter for screening mammogram for malignant neoplasm of breast: Secondary | ICD-10-CM

## 2021-10-23 DIAGNOSIS — Z170 Estrogen receptor positive status [ER+]: Secondary | ICD-10-CM

## 2021-10-24 ENCOUNTER — Ambulatory Visit (HOSPITAL_BASED_OUTPATIENT_CLINIC_OR_DEPARTMENT_OTHER): Payer: No Typology Code available for payment source | Admitting: Orthopaedic Surgery

## 2021-10-30 ENCOUNTER — Inpatient Hospital Stay: Payer: No Typology Code available for payment source

## 2021-10-30 ENCOUNTER — Encounter: Payer: Self-pay | Admitting: Hematology and Oncology

## 2021-10-30 ENCOUNTER — Inpatient Hospital Stay (HOSPITAL_BASED_OUTPATIENT_CLINIC_OR_DEPARTMENT_OTHER): Payer: No Typology Code available for payment source | Admitting: Hematology and Oncology

## 2021-10-30 ENCOUNTER — Other Ambulatory Visit: Payer: Self-pay

## 2021-10-30 VITALS — BP 134/71 | HR 71 | Temp 97.6°F | Resp 16 | Wt 212.0 lb

## 2021-10-30 DIAGNOSIS — C50411 Malignant neoplasm of upper-outer quadrant of right female breast: Secondary | ICD-10-CM

## 2021-10-30 DIAGNOSIS — Z170 Estrogen receptor positive status [ER+]: Secondary | ICD-10-CM

## 2021-10-30 LAB — CBC WITH DIFFERENTIAL (CANCER CENTER ONLY)
Abs Immature Granulocytes: 0.01 10*3/uL (ref 0.00–0.07)
Basophils Absolute: 0 10*3/uL (ref 0.0–0.1)
Basophils Relative: 1 %
Eosinophils Absolute: 0.1 10*3/uL (ref 0.0–0.5)
Eosinophils Relative: 3 %
HCT: 32.2 % — ABNORMAL LOW (ref 36.0–46.0)
Hemoglobin: 10.7 g/dL — ABNORMAL LOW (ref 12.0–15.0)
Immature Granulocytes: 0 %
Lymphocytes Relative: 42 %
Lymphs Abs: 1.4 10*3/uL (ref 0.7–4.0)
MCH: 31.5 pg (ref 26.0–34.0)
MCHC: 33.2 g/dL (ref 30.0–36.0)
MCV: 94.7 fL (ref 80.0–100.0)
Monocytes Absolute: 0.2 10*3/uL (ref 0.1–1.0)
Monocytes Relative: 5 %
Neutro Abs: 1.7 10*3/uL (ref 1.7–7.7)
Neutrophils Relative %: 49 %
Platelet Count: 172 10*3/uL (ref 150–400)
RBC: 3.4 MIL/uL — ABNORMAL LOW (ref 3.87–5.11)
RDW: 18.4 % — ABNORMAL HIGH (ref 11.5–15.5)
WBC Count: 3.4 10*3/uL — ABNORMAL LOW (ref 4.0–10.5)
nRBC: 0 % (ref 0.0–0.2)

## 2021-10-30 LAB — CMP (CANCER CENTER ONLY)
ALT: 26 U/L (ref 0–44)
AST: 24 U/L (ref 15–41)
Albumin: 4.1 g/dL (ref 3.5–5.0)
Alkaline Phosphatase: 76 U/L (ref 38–126)
Anion gap: 8 (ref 5–15)
BUN: 9 mg/dL (ref 8–23)
CO2: 25 mmol/L (ref 22–32)
Calcium: 9 mg/dL (ref 8.9–10.3)
Chloride: 101 mmol/L (ref 98–111)
Creatinine: 0.85 mg/dL (ref 0.44–1.00)
GFR, Estimated: >60 mL/min (ref >60)
Glucose, Bld: 128 mg/dL — ABNORMAL HIGH (ref 70–99)
Potassium: 4.4 mmol/L (ref 3.5–5.1)
Sodium: 134 mmol/L — ABNORMAL LOW (ref 135–145)
Total Bilirubin: 0.5 mg/dL (ref 0.3–1.2)
Total Protein: 7 g/dL (ref 6.5–8.1)

## 2021-10-30 NOTE — Progress Notes (Signed)
[place]  [phone] [phone]     ID: [name] [name] DOB: 04-02-1956  MR#: [id number]  CSN#:[id number]  Patient Care Team: Sandrea Hughs, NP as PCP - General (Family Medicine) Mauro Kaufmann, RN as Oncology Nurse Navigator Rockwell Germany, RN as Oncology Nurse Navigator Erroll Luna, MD as Consulting Physician (General Surgery) Eppie Gibson, MD as Attending Physician (Radiation Oncology) Benay Pike, MD as Medical Oncologist (Hematology and Oncology) Raina Mina, RPH-CPP as Pharmacist (Hematology and Oncology) Benay Pike, MD  OTHER MD:  CHIEF COMPLAINT: Estrogen receptor positive breast cancer  CURRENT TREATMENT: Anastrozole with abemaciclib.  INTERVAL HISTORY:  [name] [name] returns today for follow up of her estrogen receptor positive breast cancer.  She is on adjuvant anastrazole with abemaciclib. She is currently on 100 mg p.o. twice daily and is tolerating it well.  She has very intermittent diarrhea and has noticed 1 or 2 episodes of heartburn.  No overt nausea or vomiting.  She has noticed some darkening of her palms and soles.  She is taking anastrozole as recommended.  She has now noticed that her nipple is inverted but has not been draining anymore.  She has mammogram scheduled in October.  No other changes in health reported.  Rest of the pertinent 10 point ROS reviewed and negative.    COVID 19 VACCINATION STATUS: s/p  [employer]  X2, most recently June 2021, no booster as of 05/01/2020   HISTORY OF CURRENT ILLNESS: From the original intake note:  [name] [name] herself palpated a mass in the right axilla "a long time ago", more recently with complaints of associated pain. She brought it to medical attention and underwent bilateral diagnostic mammography with tomography and bilateral breast ultrasonography at [facility] on 01/31/2020 showing: breast density category B; three right breast masses-- 1.9 cm at 12  o'clock, 2.3 cm at 11 o'clock, 0.4 cm at 1 o'clock; two adjacent enlarged thickened lymph nodes measuring up to 2.1 cm; no evidence of left breast malignancy.  Accordingly on 02/03/2020 she proceeded to biopsy of the right breast area at 12 o'clock and right axilla. The pathology from this procedure ([id number]) showed: invasive ductal carcinoma, grade 3. Prognostic indicators significant for: estrogen receptor, 100% positive with strong staining intensity and progesterone receptor, 0% negative. Proliferation marker Ki67 at 60%. HER2 equivocal by immunohistochemistry (2+), but negative by fluorescent in situ hybridization with a signals ratio 0.81 and number per cell 2.7.  The biopsied lymph node was found to show metastatic carcinoma. The morphology was considered different from the biopsied mass, and a prognostic panel was performed. Estrogen receptor, 100% positive with strong staining intensity and progesterone receptor, 0% negative. Proliferation marker Ki67 at 70%. HER2 equivcoal by immunohistochemistry (2+), but negative) by fluorescent in situ hybridization with a signals ratio 1.16 and number per cell 3.9.  She underwent additional right breast biopsies on 02/06/2020. Pathology [id number]) from the mass at 11 o'clock showed: invasive ductal carcinoma, grade 2. Prognostic indicators significant for: estrogen receptor, 100% positive with strong staining intensity and progesterone receptor, 0% negative. Proliferation marker Ki67 at 15%. HER2 equivocal by immunohistochemistry (2+), but negative by fluorescent in situ hybridization with a signals ratio 1.29 and number per cell 2.7.  The biopsied mass at 1 o'clock showed only fibrocystic change.  The patient's subsequent history is as detailed below.   PAST MEDICAL HISTORY: Past Medical History:  Diagnosis Date   Arthritis    Breast cancer ([place])    Cancer ([place])  breast   Diabetes mellitus without complication ([place])    History of MRI     Knee, Hip, and Back.   Hypertension    Personal history of chemotherapy    Personal history of radiation therapy     PAST SURGICAL HISTORY: Past Surgical History:  Procedure Laterality Date   BREAST BIOPSY Right 02/03/2020   x2   BREAST BIOPSY Right 02/06/2020   x2   BREAST LUMPECTOMY Right 07/26/2020   BREAST LUMPECTOMY WITH RADIOACTIVE SEED AND SENTINEL LYMPH NODE BIOPSY Right 07/26/2020   Procedure: RIGHT BREAST LUMPECTOMY WITH RADIOACTIVE SEED X 2 AND SENTINEL LYMPH NODE BIOPSY;  Surgeon: Erroll Luna, MD;  Location: [facility];  Service: General;  Laterality: Right;   IR IMAGING GUIDED PORT INSERTION  02/23/2020   PORT-A-CATH REMOVAL N/A 07/26/2020   Procedure: REMOVAL PORT-A-CATH;  Surgeon: Erroll Luna, MD;  Location: [facility];  Service: General;  Laterality: N/A;    FAMILY HISTORY: Family History  Problem Relation Age of Onset   Asthma Mother    Arthritis Sister    Asthma Brother    Arthritis Brother   [name] died in his 8s from causes unknown to the patient.  [name] died from heart disease at age 35.  The patient had 4 brothers, 1 sister, with no history of cancer in the family to her knowledge   GYNECOLOGIC HISTORY:  No LMP recorded. Patient is postmenopausal. Menarche: 66 years old Age at first live birth: 66 years old [place] P 1 LMP 11s Contraceptive HRT no  Hysterectomy? no BSO? no   SOCIAL HISTORY: (updated 02/2020)  [name] [name] is originally from [place].  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  She describes herself as single.  At home she lives with her daughter [name] who works for [employer], her daughters' husband [name] who is a English as a second language teacher, and their children who are 50 months old and 48 years old.  The patient is [name] [name]    ADVANCED DIRECTIVES: Not in place.  The patient tells me she would intend to name her daughter [name] [name] as her healthcare power of  attorney   HEALTH MAINTENANCE: Social History   Tobacco Use   Smoking status: Never   Smokeless tobacco: Never  Vaping Use   Vaping Use: Never used  Substance Use Topics   Alcohol use: Yes    Alcohol/week: 1.0 standard drink of alcohol    Types: 1 Glasses of wine per week    Comment: occasionally   Drug use: Never     Colonoscopy: never done  PAP: 2017 (performed in [place])  Bone density:    Allergies  Allergen Reactions   Sulfa Antibiotics Itching    Current Outpatient Medications  Medication Sig Dispense Refill   abemaciclib (VERZENIO) 100 MG tablet Take 1 tablet (100 mg total) by mouth 2 (two) times daily. Swallow tablets whole. Do not chew, crush, or split tablets before swallowing. 56 tablet 1   acetaminophen (TYLENOL) 500 MG tablet Take 500 mg by mouth 3 (three) times daily as needed.     anastrozole (ARIMIDEX) 1 MG tablet Take 1 tablet (1 mg total) by mouth daily. 90 tablet 4   aspirin 81 MG chewable tablet Chew 81 mg by mouth daily.     atorvastatin (LIPITOR) 40 MG tablet Take 1 tablet (40 mg total) by mouth daily. 90 tablet 1   carvedilol (COREG) 6.25 MG tablet Take 6.25 mg by mouth  2 (two) times daily with a meal.     Garlic 1443 MG CAPS Take 1 capsule by mouth daily.     lansoprazole (PREVACID) 30 MG capsule Take 30 mg by mouth every morning.     Magnesium 100 MG CAPS Take 1 capsule by mouth 2 (two) times daily.     meloxicam (MOBIC) 15 MG tablet Take 1 tablet (15 mg total) by mouth daily. 90 tablet 1   metFORMIN (GLUCOPHAGE) 1000 MG tablet Take 1 tablet (1,000 mg total) by mouth 2 (two) times daily with a meal. 180 tablet 1   Multiple Vitamins-Minerals (CENTRUM SILVER 50+WOMEN PO) Take 1 capsule by mouth daily.     valsartan (DIOVAN) 320 MG tablet Take 320 mg by mouth daily.     vitamin C (ASCORBIC ACID) 500 MG tablet Take 500 mg by mouth daily.     No current facility-administered medications for this visit.    OBJECTIVE: African-American woman in no  acute distress  There were no vitals filed for this visit.     There is no height or weight on file to calculate BMI.   Wt Readings from Last 3 Encounters:  10/15/21 211 lb 1.6 oz (95.8 kg)  09/30/21 213 lb (96.6 kg)  09/03/21 210 lb (95.3 kg)     ECOG FS:1 - Symptomatic but completely ambulatory  Physical Exam Constitutional:      Appearance: Normal appearance.  Cardiovascular:     Rate and Rhythm: Normal rate and regular rhythm.     Pulses: Normal pulses.     Heart sounds: Normal heart sounds.  Chest:     Comments: Right breast status postradiation changes.  The postop hematoma appears less prominent today, no nipple discharge elicited.  I wonder if the seroma has now become more organized and causing some inversion of the nipple.  No palpable masses no other regional adenopathy.  Left breast normal to inspection and palpation Musculoskeletal:     Cervical back: Normal range of motion and neck supple. No rigidity.  Lymphadenopathy:     Cervical: No cervical adenopathy.  Skin:    General: Skin is warm and dry.  Neurological:     General: No focal deficit present.     Mental Status: She is alert.       LAB RESULTS:  CMP     Component Value Date/Time   NA 135 10/15/2021 1431   K 4.7 10/15/2021 1431   CL 103 10/15/2021 1431   CO2 28 10/15/2021 1431   GLUCOSE 143 (H) 10/15/2021 1431   BUN 14 10/15/2021 1431   CREATININE 1.06 (H) 10/15/2021 1431   CREATININE 0.59 08/14/2021 0807   CALCIUM 9.3 10/15/2021 1431   PROT 6.5 10/15/2021 1431   ALBUMIN 4.1 10/15/2021 1431   AST 15 10/15/2021 1431   ALT 13 10/15/2021 1431   ALKPHOS 76 10/15/2021 1431   BILITOT 0.3 10/15/2021 1431   GFRNONAA 59 (L) 10/15/2021 1431    No results found for: "TOTALPROTELP", "ALBUMINELP", "A1GS", "A2GS", "BETS", "BETA2SER", "GAMS", "MSPIKE", "SPEI"  Lab Results  Component Value Date   WBC 3.4 (L) 10/15/2021   NEUTROABS 1.7 10/15/2021   HGB 10.7 (L) 10/15/2021   HCT 31.3 (L) 10/15/2021    MCV 92.3 10/15/2021   PLT 214 10/15/2021    No results found for: "LABCA2"  No components found for: "XVQMGQ676"  No results for input(s): "INR" in the last 168 hours.  No results found for: "LABCA2"  No results found for: "PPJ093"  No  results found for: "CAN125"  No results found for: "CAN153"  No results found for: "CA2729"  No components found for: "HGQUANT"  No results found for: "CEA1", "CEA" / No results found for: "CEA1", "CEA"   No results found for: "AFPTUMOR"  No results found for: "CHROMOGRNA"  No results found for: "KPAFRELGTCHN", "LAMBDASER", "KAPLAMBRATIO" (kappa/lambda light chains)  No results found for: "HGBA", "HGBA2QUANT", "HGBFQUANT", "HGBSQUAN" (Hemoglobinopathy evaluation)   No results found for: "LDH"  No results found for: "IRON", "TIBC", "IRONPCTSAT" (Iron and TIBC)  No results found for: "FERRITIN"  Urinalysis No results found for: "COLORURINE", "APPEARANCEUR", "LABSPEC", "PHURINE", "GLUCOSEU", "HGBUR", "BILIRUBINUR", "KETONESUR", "PROTEINUR", "UROBILINOGEN", "NITRITE", "LEUKOCYTESUR"   STUDIES: No results found.   ELIGIBLE FOR AVAILABLE RESEARCH PROTOCOL: AET  ASSESSMENT: 66 y.o. [place] woman status post right breast upper outer quadrant (12:00) and axillary lymph node biopsy 02/03/2020, both positive for a clinical T1c N1, stage IIB invasive ductal carcinoma, grade 3, estrogen receptor positive, HER-2 and progesterone receptor negative, with an MIB-1 of 60%.  (a) biopsy of a second right upper outer quadrant T2 lesion (11 o'clock) 02/06/2020 also showed invasive ductal carcinoma, but grade 2, estrogen receptor positive, progesterone receptor and HER-2 negative, with an MIB-1 of 15%  (b) biopsy of a third right breast lesion (1:00) was benign   (1) neoadjuvant chemotherapy consisting of cyclophosphamide and docetaxel every 21 days x 4 starting 03/05/2020  (a) discontinued after 2 cycles because of peripheral neuropathy  (b)  cyclophosphamide/doxorubicin started 04/24/2020, repeated every 21 days x4, completed 06/25/2020  (c) echo 04/17/2020 showed EF of 55-60%  (2) right lumpectomy 07/26/2020 found a residual pT1c pN1 invasive ductal carcinoma, grade 2, with negative margins  (a) repeat prognostic indicators show the tumor to be estrogen receptor positive, progesterone receptor and HER2 negative, with an MIB-1 of 2%.  (b) a total of 6 right axillary lymph nodes removed, 2 positive  (3) adjuvant radiation completed 10/17/2020  (4) anastrozole started June 2022  (5) CT scan of the chest 04/23/2020 shows a left brachiocephalicl catheter associated clot  (a) rivaroxaban started 04/24/2020, discontinued 08/22/2020  (b) port removed 07/26/2020   PLAN:  Started abemaciclib in March 2023.  She cannot remember why she did not started sooner, she was wondering maybe she wanted to think about it. She is here for toxicity check while on anastrozole and abemaciclib. Last MRI breast Jan 2023 showed no evidence of breast malignancy. Post surgical collection right breast near right nipple which could have been causing the drainage.  Mammogram recommended in 9 months. Physical examination today, postop hematoma/seroma appears to have improved, no nipple drainage however some nipple inversion noted. I believe this is likely because of organization of the hematoma/seroma causing some nipple inversion.  We will continue to monitor this.  No regional adenopathy otherwise. Return to clinic in 4 weeks with our [place], CBC from today with some leukopenia but overall satisfactory.  Rest of the labs are pending. Return to clinic with me in 8 weeks for toxicity check.  When she comes in 4 weeks since she just received a new refill, we can consider uptitrating the [place] and see how she tolerates it.  She is okay with this plan.  Total encounter time 30 minutes. *Total Encounter Time as defined by the Centers for Medicare and  Medicaid Services includes, in addition to the face-to-face time of a patient visit (documented in the note above) non-face-to-face time: obtaining and reviewing outside history, ordering and reviewing medications, tests or procedures, care  coordination (communications with other health care professionals or caregivers) and documentation in the medical record.

## 2021-11-06 NOTE — Progress Notes (Signed)
Surgical Instructions    Your procedure is scheduled on Monday July 10th.  Report to [facility] Main Entrance "A" at 5:30 A.M., then check in with the Admitting office.  Call this number if you have problems the morning of surgery:  [phone]   If you have any questions prior to your surgery date call [phone]: Open Monday-Friday 8am-4pm    Remember:  Do not eat after midnight the night before your surgery  You may drink clear liquids until 4:30am the morning of your surgery.   Clear liquids allowed are: Water, Non-Citrus Juices (without pulp), Carbonated Beverages, Clear Tea, Black Coffee ONLY (NO MILK, CREAM OR POWDERED CREAMER of any kind), and Gatorade   Enhanced Recovery after Surgery for Orthopedics Enhanced Recovery after Surgery is a protocol used to improve the stress on your body and your recovery after surgery.  Patient Instructions   The day of surgery (if you have diabetes):  Drink ONE small 10 oz bottle of water by __4:30___ am the morning of surgery This bottle was given to you during your hospital  pre-op appointment visit.  Nothing else to drink after completing the  Small 10 oz bottle of water.         If you have questions, please contact your surgeon's office.     Take these medicines the morning of surgery with A SIP OF WATER: atorvastatin (LIPITOR) 40 MG tablet lansoprazole (PREVACID) 30 MG capsule abemaciclib (VERZENIO) 100 MG tablet (Consult with oncologist to be sure ok to take) anastrozole (ARIMIDEX) 1 MG tablet  (Consult with oncologist to be sure ok to take)  IF NEEDED acetaminophen (TYLENOL) 500 MG tablet   Follow your surgeon's instructions on when to stop Aspirin.  If no instructions were given by your surgeon then you will need to call the office to get those instructions.     As of today, STOP taking any Aspirin (unless otherwise instructed by your surgeon) Mobic, Aleve, Naproxen, Ibuprofen, Motrin, Advil, Goody's, BC's, all  herbal medications, fish oil, and all vitamins.  WHAT DO I DO ABOUT MY DIABETES MEDICATION?   Do not take oral diabetes medicines (Metformin) the morning of surgery.   The day of surgery, do not take other diabetes injectables, including Byetta (exenatide), Bydureon (exenatide ER), Victoza (liraglutide), or Trulicity (dulaglutide).  If your CBG is greater than 220 mg/dL, you may take  of your sliding scale (correction) dose of insulin.   HOW TO MANAGE YOUR DIABETES BEFORE AND AFTER SURGERY  Why is it important to control my blood sugar before and after surgery? Improving blood sugar levels before and after surgery helps healing and can limit problems. A way of improving blood sugar control is eating a healthy diet by:  Eating less sugar and carbohydrates  Increasing activity/exercise  Talking with your doctor about reaching your blood sugar goals High blood sugars (greater than 180 mg/dL) can raise your risk of infections and slow your recovery, so you will need to focus on controlling your diabetes during the weeks before surgery. Make sure that the doctor who takes care of your diabetes knows about your planned surgery including the date and location.  How do I manage my blood sugar before surgery? Check your blood sugar at least 4 times a day, starting 2 days before surgery, to make sure that the level is not too high or low.  Check your blood sugar the morning of your surgery when you wake up and every 2 hours until you get to  the Short Stay unit.  If your blood sugar is less than 70 mg/dL, you will need to treat for low blood sugar: Do not take insulin. Treat a low blood sugar (less than 70 mg/dL) with  cup of clear juice (cranberry or apple), 4 glucose tablets, OR glucose gel. Recheck blood sugar in 15 minutes after treatment (to make sure it is greater than 70 mg/dL). If your blood sugar is not greater than 70 mg/dL on recheck, call [phone] for further  instructions. Report your blood sugar to the short stay nurse when you get to Short Stay.  If you are admitted to the hospital after surgery: Your blood sugar will be checked by the staff and you will probably be given insulin after surgery (instead of oral diabetes medicines) to make sure you have good blood sugar levels. The goal for blood sugar control after surgery is 80-180 mg/dL.           Do not wear jewelry or makeup Do not wear lotions, powders, perfu (Consult with oncologist to be sure ok to take)mes, or deodorant. Do not shave 48 hours prior to surgery.   Do not bring valuables to the hospital. Do not wear nail polish, gel polish, artificial nails, or any other type of covering on natural nails (fingers and toes) If you have artificial nails or gel coating that need to be removed by a nail salon, please have this removed prior to surgery. Artificial nails or gel coating may interfere with anesthesia's ability to adequately monitor your vital signs.  [facility] is not responsible for any belongings or valuables. .   Do NOT Smoke (Tobacco/Vaping)  24 hours prior to your procedure  If you use a CPAP at night, you may bring your mask for your overnight stay.   Contacts, glasses, hearing aids, dentures or partials may not be worn into surgery, please bring cases for these belongings   For patients admitted to the hospital, discharge time will be determined by your treatment team.   Patients discharged the day of surgery will not be allowed to drive home, and someone needs to stay with them for 24 hours.   SURGICAL WAITING ROOM VISITATION Patients having surgery or a procedure in a hospital may have two support people. Children under the age of 37 must have an adult with them who is not the patient. They may stay in the waiting area during the procedure and may switch out with other visitors. If the patient needs to stay at the hospital during part of their recovery, the visitor  guidelines for inpatient rooms apply.  Please refer to the [facility] website for the visitor guidelines for Inpatients (after your surgery is over and you are in a regular room).       Special instructions:    Oral Hygiene is also important to reduce your risk of infection.  Remember - BRUSH YOUR TEETH THE MORNING OF SURGERY WITH YOUR REGULAR TOOTHPASTE   [facility]- Preparing For Surgery  Before surgery, you can play an important role. Because skin is not sterile, your skin needs to be as free of germs as possible. You can reduce the number of germs on your skin by washing with CHG (chlorahexidine gluconate) Soap before surgery.  CHG is an antiseptic cleaner which kills germs and bonds with the skin to continue killing germs even after washing.     Please do not use if you have an allergy to CHG or antibacterial soaps. If your skin  becomes reddened/irritated stop using the CHG.  Do not shave (including legs and underarms) for at least 48 hours prior to first CHG shower. It is OK to shave your face.  Please follow these instructions carefully.     Shower the NIGHT BEFORE SURGERY and the MORNING OF SURGERY with CHG Soap.   If you chose to wash your hair, wash your hair first as usual with your normal shampoo. After you shampoo, rinse your hair and body thoroughly to remove the shampoo.  Then [employer] and genitals (private parts) with your normal soap and rinse thoroughly to remove soap.  After that Use CHG Soap as you would any other liquid soap. You can apply CHG directly to the skin and wash gently with a scrungie or a clean washcloth.   Apply the CHG Soap to your body ONLY FROM THE NECK DOWN.  Do not use on open wounds or open sores. Avoid contact with your eyes, ears, mouth and genitals (private parts). Wash Face and genitals (private parts)  with your normal soap.   Wash thoroughly, paying special attention to the area where your surgery will be performed.  Thoroughly rinse  your body with warm water from the neck down.  DO NOT shower/wash with your normal soap after using and rinsing off the CHG Soap.  Pat yourself dry with a CLEAN TOWEL.  Wear CLEAN PAJAMAS to bed the night before surgery  Place CLEAN SHEETS on your bed the night before your surgery  DO NOT SLEEP WITH PETS.   Day of Surgery:  Take a shower with CHG soap. Wear Clean/Comfortable clothing the morning of surgery Do not apply any deodorants/lotions.   Remember to brush your teeth WITH YOUR REGULAR TOOTHPASTE.    If you received a COVID test during your pre-op visit, it is requested that you wear a mask when out in public, stay away from anyone that may not be feeling well, and notify your surgeon if you develop symptoms. If you have been in contact with anyone that has tested positive in the last 10 days, please notify your surgeon.    Please read over the following fact sheets that you were given.

## 2021-11-07 ENCOUNTER — Encounter (HOSPITAL_COMMUNITY): Payer: Self-pay

## 2021-11-07 ENCOUNTER — Other Ambulatory Visit: Payer: Self-pay

## 2021-11-07 ENCOUNTER — Ambulatory Visit
Admission: RE | Admit: 2021-11-07 | Discharge: 2021-11-07 | Disposition: A | Discharge status: Home / Self Care | Payer: No Typology Code available for payment source | Source: Ambulatory Visit | Attending: Hematology and Oncology | Admitting: Hematology and Oncology

## 2021-11-07 ENCOUNTER — Encounter (HOSPITAL_COMMUNITY)
Admission: RE | Admit: 2021-11-07 | Discharge: 2021-11-07 | Disposition: A | Discharge status: Home / Self Care | Payer: Self-pay | Source: Ambulatory Visit | Attending: Orthopaedic Surgery | Admitting: Orthopaedic Surgery

## 2021-11-07 VITALS — BP 133/75 | HR 54 | Temp 97.7°F | Resp 17 | Ht 66.0 in | Wt 211.0 lb

## 2021-11-07 DIAGNOSIS — Z79811 Long term (current) use of aromatase inhibitors: Secondary | ICD-10-CM

## 2021-11-07 DIAGNOSIS — M1712 Unilateral primary osteoarthritis, left knee: Secondary | ICD-10-CM | POA: Insufficient documentation

## 2021-11-07 DIAGNOSIS — C50411 Malignant neoplasm of upper-outer quadrant of right female breast: Secondary | ICD-10-CM

## 2021-11-07 DIAGNOSIS — Z780 Asymptomatic menopausal state: Secondary | ICD-10-CM

## 2021-11-07 DIAGNOSIS — Z01818 Encounter for other preprocedural examination: Secondary | ICD-10-CM | POA: Insufficient documentation

## 2021-11-07 LAB — CBC
HCT: 33 % — ABNORMAL LOW (ref 36.0–46.0)
Hemoglobin: 10.7 g/dL — ABNORMAL LOW (ref 12.0–15.0)
MCH: 31.5 pg (ref 26.0–34.0)
MCHC: 32.4 g/dL (ref 30.0–36.0)
MCV: 97.1 fL (ref 80.0–100.0)
Platelets: 200 10*3/uL (ref 150–400)
RBC: 3.4 MIL/uL — ABNORMAL LOW (ref 3.87–5.11)
RDW: 18.2 % — ABNORMAL HIGH (ref 11.5–15.5)
WBC: 3.7 10*3/uL — ABNORMAL LOW (ref 4.0–10.5)
nRBC: 0 % (ref 0.0–0.2)

## 2021-11-07 LAB — COMPREHENSIVE METABOLIC PANEL
ALT: 20 U/L (ref 0–44)
AST: 21 U/L (ref 15–41)
Albumin: 3.9 g/dL (ref 3.5–5.0)
Alkaline Phosphatase: 84 U/L (ref 38–126)
Anion gap: 6 (ref 5–15)
BUN: 9 mg/dL (ref 8–23)
CO2: 27 mmol/L (ref 22–32)
Calcium: 9.2 mg/dL (ref 8.9–10.3)
Chloride: 103 mmol/L (ref 98–111)
Creatinine, Ser: 0.89 mg/dL (ref 0.44–1.00)
GFR, Estimated: >60 mL/min (ref >60)
Glucose, Bld: 105 mg/dL — ABNORMAL HIGH (ref 70–99)
Potassium: 4.8 mmol/L (ref 3.5–5.1)
Sodium: 136 mmol/L (ref 135–145)
Total Bilirubin: 0.6 mg/dL (ref 0.3–1.2)
Total Protein: 6.3 g/dL — ABNORMAL LOW (ref 6.5–8.1)

## 2021-11-07 LAB — HEMOGLOBIN A1C
Hgb A1c MFr Bld: 6 % — ABNORMAL HIGH (ref 4.8–5.6)
Mean Plasma Glucose: 126 mg/dL

## 2021-11-07 LAB — SURGICAL PCR SCREEN
MRSA, PCR: NEGATIVE
Staphylococcus aureus: NEGATIVE

## 2021-11-07 LAB — GLUCOSE, CAPILLARY: Glucose-Capillary: 106 mg/dL — ABNORMAL HIGH (ref 70–99)

## 2021-11-07 NOTE — Progress Notes (Addendum)
PCP - Marlowe Sax NP Cardiologist - denies  PPM/ICD - denies Device Orders -  Rep Notified -   Chest x-ray - na EKG - 11/07/21 Stress Test - none ECHO - none Cardiac Cath - none  Sleep Study - none CPAP -   Fasting Blood Sugar - 100-110 Checks Blood Sugar every morning  Blood Thinner Instructions:na  Aspirin Instructions:Pt states she will call [name] office for instructions regarding aspirin.  ERAS Protcol -clear liquids until 0430 PRE-SURGERY Ensure or G2- G2  COVID TEST- na   Anesthesia review: no  Patient denies shortness of breath, fever, cough and chest pain at PAT appointment   All instructions explained to the patient, with a verbal understanding of the material. Patient agrees to go over the instructions while at home for a better understanding. Patient also instructed to wear a mask when out in public prior to surgery. The opportunity to ask questions was provided.

## 2021-11-07 NOTE — Progress Notes (Signed)
Surgical Instructions    Your procedure is scheduled on Monday July 10th.  Report to [facility] Main Entrance "A" at 5:30 A.M., then check in with the Admitting office.  Call this number if you have problems the morning of surgery:  [phone]   If you have any questions prior to your surgery date call [phone]: Open Monday-Friday 8am-4pm    Remember:  Do not eat after midnight the night before your surgery  You may drink clear liquids until 4:30am the morning of your surgery.   Clear liquids allowed are: Water, Non-Citrus Juices (without pulp), Carbonated Beverages, Clear Tea, Black Coffee ONLY (NO MILK, CREAM OR POWDERED CREAMER of any kind), and Gatorade   Enhanced Recovery after Surgery for Orthopedics Enhanced Recovery after Surgery is a protocol used to improve the stress on your body and your recovery after surgery.   The day of surgery (if you have diabetes):  Drink ONE small 10 oz bottle of water by __4:30___ am the morning of surgery This bottle was given to you during your hospital  pre-op appointment visit.  Nothing else to drink after completing the  Small 10 oz bottle of water.         If you have questions, please contact your surgeon's office.     Take these medicines the morning of surgery with A SIP OF WATER: atorvastatin (LIPITOR) 40 MG tablet lansoprazole (PREVACID) 30 MG capsule abemaciclib (VERZENIO) 100 MG tablet (Consult with oncologist to be sure ok to take) anastrozole (ARIMIDEX) 1 MG tablet  (Consult with oncologist to be sure ok to take) carvedilol (COREG)   IF NEEDED acetaminophen (TYLENOL) 500 MG tablet   Follow your surgeon's instructions on when to stop Aspirin.  If no instructions were given by your surgeon then you will need to call the office to get those instructions.     As of today, STOP taking any Aspirin (unless otherwise instructed by your surgeon) Mobic, Aleve, Naproxen, Ibuprofen, Motrin, Advil, Goody's, BC's, all herbal  medications, fish oil, and all vitamins.  WHAT DO I DO ABOUT MY DIABETES MEDICATION?   Do not take oral diabetes medicines (Metformin) the morning of surgery.   The day of surgery, do not take other diabetes injectables, including Byetta (exenatide), Bydureon (exenatide ER), Victoza (liraglutide), or Trulicity (dulaglutide).  If your CBG is greater than 220 mg/dL, you may take  of your sliding scale (correction) dose of insulin.   HOW TO MANAGE YOUR DIABETES BEFORE AND AFTER SURGERY  Why is it important to control my blood sugar before and after surgery? Improving blood sugar levels before and after surgery helps healing and can limit problems. A way of improving blood sugar control is eating a healthy diet by:  Eating less sugar and carbohydrates  Increasing activity/exercise  Talking with your doctor about reaching your blood sugar goals High blood sugars (greater than 180 mg/dL) can raise your risk of infections and slow your recovery, so you will need to focus on controlling your diabetes during the weeks before surgery. Make sure that the doctor who takes care of your diabetes knows about your planned surgery including the date and location.  How do I manage my blood sugar before surgery? Check your blood sugar at least 4 times a day, starting 2 days before surgery, to make sure that the level is not too high or low.  Check your blood sugar the morning of your surgery when you wake up and every 2 hours until you get to  the Short Stay unit.  If your blood sugar is less than 70 mg/dL, you will need to treat for low blood sugar: Do not take insulin. Treat a low blood sugar (less than 70 mg/dL) with  cup of clear juice (cranberry or apple), 4 glucose tablets, OR glucose gel. Recheck blood sugar in 15 minutes after treatment (to make sure it is greater than 70 mg/dL). If your blood sugar is not greater than 70 mg/dL on recheck, call [phone] for further instructions. Report  your blood sugar to the short stay nurse when you get to Short Stay.  If you are admitted to the hospital after surgery: Your blood sugar will be checked by the staff and you will probably be given insulin after surgery (instead of oral diabetes medicines) to make sure you have good blood sugar levels. The goal for blood sugar control after surgery is 80-180 mg/dL.           Do not wear jewelry or makeup Do not wear lotions, powders, perfu (Consult with oncologist to be sure ok to take)mes, or deodorant. Do not shave 48 hours prior to surgery.   Do not bring valuables to the hospital. Do not wear nail polish, gel polish, artificial nails, or any other type of covering on natural nails (fingers and toes) If you have artificial nails or gel coating that need to be removed by a nail salon, please have this removed prior to surgery. Artificial nails or gel coating may interfere with anesthesia's ability to adequately monitor your vital signs.  [facility] is not responsible for any belongings or valuables. .   Do NOT Smoke (Tobacco/Vaping)  24 hours prior to your procedure  If you use a CPAP at night, you may bring your mask for your overnight stay.   Contacts, glasses, hearing aids, dentures or partials may not be worn into surgery, please bring cases for these belongings   For patients admitted to the hospital, discharge time will be determined by your treatment team.   Patients discharged the day of surgery will not be allowed to drive home, and someone needs to stay with them for 24 hours.   SURGICAL WAITING ROOM VISITATION Patients having surgery or a procedure in a hospital may have two support people. Children under the age of 66 must have an adult with them who is not the patient. They may stay in the waiting area during the procedure and may switch out with other visitors. If the patient needs to stay at the hospital during part of their recovery, the visitor guidelines for  inpatient rooms apply.  Please refer to the [facility] website for the visitor guidelines for Inpatients (after your surgery is over and you are in a regular room).       Special instructions:    Oral Hygiene is also important to reduce your risk of infection.  Remember - BRUSH YOUR TEETH THE MORNING OF SURGERY WITH YOUR REGULAR TOOTHPASTE   [facility]- Preparing For Surgery  Before surgery, you can play an important role. Because skin is not sterile, your skin needs to be as free of germs as possible. You can reduce the number of germs on your skin by washing with CHG (chlorahexidine gluconate) Soap before surgery.  CHG is an antiseptic cleaner which kills germs and bonds with the skin to continue killing germs even after washing.     Please do not use if you have an allergy to CHG or antibacterial soaps. If your skin  becomes reddened/irritated stop using the CHG.  Do not shave (including legs and underarms) for at least 48 hours prior to first CHG shower. It is OK to shave your face.  Please follow these instructions carefully.     Shower the NIGHT BEFORE SURGERY and the MORNING OF SURGERY with CHG Soap.   If you chose to wash your hair, wash your hair first as usual with your normal shampoo. After you shampoo, rinse your hair and body thoroughly to remove the shampoo.  Then [employer] and genitals (private parts) with your normal soap and rinse thoroughly to remove soap.  After that Use CHG Soap as you would any other liquid soap. You can apply CHG directly to the skin and wash gently with a scrungie or a clean washcloth.   Apply the CHG Soap to your body ONLY FROM THE NECK DOWN.  Do not use on open wounds or open sores. Avoid contact with your eyes, ears, mouth and genitals (private parts). Wash Face and genitals (private parts)  with your normal soap.   Wash thoroughly, paying special attention to the area where your surgery will be performed.  Thoroughly rinse your body with  warm water from the neck down.  DO NOT shower/wash with your normal soap after using and rinsing off the CHG Soap.  Pat yourself dry with a CLEAN TOWEL.  Wear CLEAN PAJAMAS to bed the night before surgery  Place CLEAN SHEETS on your bed the night before your surgery  DO NOT SLEEP WITH PETS.   Day of Surgery:  Take a shower with CHG soap. Wear Clean/Comfortable clothing the morning of surgery Do not apply any deodorants/lotions.   Remember to brush your teeth WITH YOUR REGULAR TOOTHPASTE.    If you received a COVID test during your pre-op visit, it is requested that you wear a mask when out in public, stay away from anyone that may not be feeling well, and notify your surgeon if you develop symptoms. If you have been in contact with anyone that has tested positive in the last 10 days, please notify your surgeon.    Please read over the following fact sheets that you were given.

## 2021-11-13 ENCOUNTER — Telehealth: Payer: Self-pay | Admitting: Physician Assistant

## 2021-11-13 ENCOUNTER — Other Ambulatory Visit: Payer: Self-pay | Admitting: Physician Assistant

## 2021-11-13 MED ORDER — OXYCODONE-ACETAMINOPHEN 5-325 MG PO TABS: 1.0000 | ORAL_TABLET | Freq: Four times a day (QID) | ORAL | 0 refills | Status: DC | PRN

## 2021-11-13 MED ORDER — ONDANSETRON HCL 4 MG PO TABS: 4.0000 mg | ORAL_TABLET | Freq: Three times a day (TID) | ORAL | 0 refills | Status: DC | PRN

## 2021-11-13 MED ORDER — DOCUSATE SODIUM 100 MG PO CAPS: 100.0000 mg | ORAL_CAPSULE | Freq: Every day | ORAL | 2 refills | Status: DC | PRN

## 2021-11-13 MED ORDER — RIVAROXABAN 10 MG PO TABS: 10.0000 mg | ORAL_TABLET | Freq: Every day | ORAL | 0 refills | Status: DC

## 2021-11-13 MED ORDER — METHOCARBAMOL 750 MG PO TABS: 750.0000 mg | ORAL_TABLET | Freq: Two times a day (BID) | ORAL | 2 refills | Status: DC | PRN

## 2021-11-13 MED ORDER — OXYCODONE-ACETAMINOPHEN 5-325 MG PO TABS: 1.0000 | ORAL_TABLET | Freq: Three times a day (TID) | ORAL | 0 refills | Status: DC | PRN

## 2021-11-13 MED ORDER — CEPHALEXIN 500 MG PO CAPS: 500.0000 mg | ORAL_CAPSULE | Freq: Four times a day (QID) | ORAL | 0 refills | Status: DC

## 2021-11-13 NOTE — Telephone Encounter (Signed)
Pharmacy called in dosage needs to be readjusted the dosage is exceeding  the daily limit may it 1 every 8 hours instead of every 6 hours

## 2021-11-13 NOTE — Telephone Encounter (Signed)
Resent the percocet

## 2021-11-15 ENCOUNTER — Encounter: Payer: Self-pay | Admitting: Oncology

## 2021-11-15 ENCOUNTER — Telehealth: Payer: Self-pay | Admitting: *Deleted

## 2021-11-15 MED ORDER — TRANEXAMIC ACID 1000 MG/10ML IV SOLN
2000.0000 mg | INTRAVENOUS | Status: DC
  Filled 2021-11-15: qty 20

## 2021-11-15 NOTE — Telephone Encounter (Signed)
-----   Message from Benay Pike, MD sent at 11/11/2021  4:58 PM EDT ----- Most recent bone density shows osteoporosis.  Please talk to her about calcium/vitamin D supplementation and weightbearing exercises.  I will talk to her about specific treatment for osteoporosis when she meets [place] in August.

## 2021-11-15 NOTE — Telephone Encounter (Signed)
This RN spoke with pt per bone density- of note pt is having a knee replacement this Monday 7/10.  This RN informed her further discussion and recommendations will be reviewed at her next appt with Dr Chryl Heck in August.

## 2021-11-17 NOTE — Anesthesia Preprocedure Evaluation (Addendum)
Anesthesia Evaluation  Patient identified by MRN, date of birth, ID band Patient awake    Reviewed: Allergy & Precautions, NPO status , Patient's Chart, lab work & pertinent test results, reviewed documented beta blocker date and time   Airway Mallampati: I  TM Distance: >3 FB Neck ROM: Full    Dental  (+) Dental Advisory Given, Missing, Chipped   Pulmonary neg pulmonary ROS,    Pulmonary exam normal breath sounds clear to auscultation       Cardiovascular hypertension, Pt. on home beta blockers and Pt. on medications (-) angina+ Peripheral Vascular Disease  (-) Past MI Normal cardiovascular exam Rhythm:Regular Rate:Normal     Neuro/Psych negative neurological ROS  negative psych ROS   GI/Hepatic Neg liver ROS, GERD  Medicated,  Endo/Other  diabetes, Type 2, Oral Hypoglycemic AgentsObesity   Renal/GU negative Renal ROS     Musculoskeletal  (+) Arthritis ,   Abdominal   Peds  Hematology  (+) Blood dyscrasia, anemia , Plt 200k   Anesthesia Other Findings H/o breast cancer   Reproductive/Obstetrics                            Anesthesia Physical Anesthesia Plan  ASA: 2  Anesthesia Plan: Spinal   Post-op Pain Management: Regional block* and Tylenol PO (pre-op)*   Induction: Intravenous  PONV Risk Score and Plan: 2 and Midazolam, Dexamethasone, Ondansetron and TIVA  Airway Management Planned: Nasal Cannula and Natural Airway  Additional Equipment:   Intra-op Plan:   Post-operative Plan:   Informed Consent: I have reviewed the patients History and Physical, chart, labs and discussed the procedure including the risks, benefits and alternatives for the proposed anesthesia with the patient or authorized representative who has indicated his/her understanding and acceptance.     Dental advisory given  Plan Discussed with: CRNA  Anesthesia Plan Comments:        Anesthesia  Quick Evaluation

## 2021-11-18 ENCOUNTER — Other Ambulatory Visit: Payer: Self-pay

## 2021-11-18 ENCOUNTER — Encounter (HOSPITAL_COMMUNITY): Payer: Self-pay | Admitting: Orthopaedic Surgery

## 2021-11-18 ENCOUNTER — Observation Stay (HOSPITAL_COMMUNITY)
Admission: RE | Admit: 2021-11-18 | Discharge: 2021-11-19 | Disposition: A | Discharge status: Home / Self Care | Payer: Self-pay | Attending: Orthopaedic Surgery | Admitting: Orthopaedic Surgery

## 2021-11-18 ENCOUNTER — Ambulatory Visit (HOSPITAL_BASED_OUTPATIENT_CLINIC_OR_DEPARTMENT_OTHER): Payer: No Typology Code available for payment source | Admitting: Anesthesiology

## 2021-11-18 ENCOUNTER — Ambulatory Visit (HOSPITAL_COMMUNITY): Payer: No Typology Code available for payment source | Admitting: Vascular Surgery

## 2021-11-18 ENCOUNTER — Encounter (HOSPITAL_COMMUNITY)
Admission: RE | Disposition: A | Discharge status: Home / Self Care | Payer: Self-pay | Source: Home / Self Care | Attending: Orthopaedic Surgery

## 2021-11-18 ENCOUNTER — Observation Stay (HOSPITAL_COMMUNITY): Payer: No Typology Code available for payment source

## 2021-11-18 DIAGNOSIS — Z7982 Long term (current) use of aspirin: Secondary | ICD-10-CM | POA: Insufficient documentation

## 2021-11-18 DIAGNOSIS — Z7984 Long term (current) use of oral hypoglycemic drugs: Secondary | ICD-10-CM | POA: Insufficient documentation

## 2021-11-18 DIAGNOSIS — M1712 Unilateral primary osteoarthritis, left knee: Principal | ICD-10-CM | POA: Insufficient documentation

## 2021-11-18 DIAGNOSIS — E119 Type 2 diabetes mellitus without complications: Secondary | ICD-10-CM | POA: Insufficient documentation

## 2021-11-18 DIAGNOSIS — Z79899 Other long term (current) drug therapy: Secondary | ICD-10-CM | POA: Insufficient documentation

## 2021-11-18 DIAGNOSIS — Z01818 Encounter for other preprocedural examination: Secondary | ICD-10-CM

## 2021-11-18 DIAGNOSIS — Z853 Personal history of malignant neoplasm of breast: Secondary | ICD-10-CM | POA: Insufficient documentation

## 2021-11-18 DIAGNOSIS — Z96652 Presence of left artificial knee joint: Secondary | ICD-10-CM

## 2021-11-18 DIAGNOSIS — I10 Essential (primary) hypertension: Secondary | ICD-10-CM | POA: Insufficient documentation

## 2021-11-18 HISTORY — PX: TOTAL KNEE ARTHROPLASTY: SHX125

## 2021-11-18 LAB — GLUCOSE, CAPILLARY
Glucose-Capillary: 117 mg/dL — ABNORMAL HIGH (ref 70–99)
Glucose-Capillary: 84 mg/dL (ref 70–99)
Glucose-Capillary: 105 mg/dL — ABNORMAL HIGH (ref 70–99)
Glucose-Capillary: 158 mg/dL — ABNORMAL HIGH (ref 70–99)

## 2021-11-18 SURGERY — ARTHROPLASTY, KNEE, TOTAL
Anesthesia: Spinal | Site: Knee | Laterality: Left

## 2021-11-18 MED ORDER — METHOCARBAMOL 500 MG PO TABS
ORAL_TABLET | ORAL | Status: AC
  Filled 2021-11-18: qty 1

## 2021-11-18 MED ORDER — GLYCOPYRROLATE 0.2 MG/ML IJ SOLN
INTRAMUSCULAR | Status: DC | PRN
  Administered 2021-11-18 (×2): .1 mg via INTRAVENOUS

## 2021-11-18 MED ORDER — TRANEXAMIC ACID-NACL 1000-0.7 MG/100ML-% IV SOLN
INTRAVENOUS | Status: AC
  Filled 2021-11-18: qty 100

## 2021-11-18 MED ORDER — ONDANSETRON HCL 4 MG/2ML IJ SOLN
INTRAMUSCULAR | Status: DC | PRN
  Administered 2021-11-18: 4 mg via INTRAVENOUS

## 2021-11-18 MED ORDER — DEXAMETHASONE SODIUM PHOSPHATE 10 MG/ML IJ SOLN
10.0000 mg | Freq: Once | INTRAMUSCULAR | Status: AC
  Administered 2021-11-19: 10 mg via INTRAVENOUS
  Filled 2021-11-18: qty 1

## 2021-11-18 MED ORDER — RIVAROXABAN 10 MG PO TABS
10.0000 mg | ORAL_TABLET | Freq: Every day | ORAL | Status: DC
  Administered 2021-11-19: 10 mg via ORAL
  Filled 2021-11-18: qty 1

## 2021-11-18 MED ORDER — PROPOFOL 10 MG/ML IV BOLUS
INTRAVENOUS | Status: AC
  Filled 2021-11-18: qty 20

## 2021-11-18 MED ORDER — VANCOMYCIN HCL 1000 MG IV SOLR
INTRAVENOUS | Status: DC | PRN
  Administered 2021-11-18: 1000 mg

## 2021-11-18 MED ORDER — FERROUS SULFATE 325 (65 FE) MG PO TABS
325.0000 mg | ORAL_TABLET | Freq: Three times a day (TID) | ORAL | Status: DC
  Administered 2021-11-18 – 2021-11-19 (×2): 325 mg via ORAL
  Filled 2021-11-18 (×3): qty 1

## 2021-11-18 MED ORDER — CEFAZOLIN SODIUM-DEXTROSE 2-4 GM/100ML-% IV SOLN
2.0000 g | Freq: Four times a day (QID) | INTRAVENOUS | Status: AC
  Administered 2021-11-18 (×2): 2 g via INTRAVENOUS
  Filled 2021-11-18 (×2): qty 100

## 2021-11-18 MED ORDER — EPHEDRINE SULFATE-NACL 50-0.9 MG/10ML-% IV SOSY
PREFILLED_SYRINGE | INTRAVENOUS | Status: DC | PRN
  Administered 2021-11-18: 5 mg via INTRAVENOUS
  Administered 2021-11-18 (×2): 10 mg via INTRAVENOUS

## 2021-11-18 MED ORDER — TRANEXAMIC ACID 1000 MG/10ML IV SOLN
INTRAVENOUS | Status: DC | PRN
  Administered 2021-11-18: 2000 mg via TOPICAL

## 2021-11-18 MED ORDER — METHOCARBAMOL 1000 MG/10ML IJ SOLN: 500.0000 mg | Freq: Four times a day (QID) | INTRAVENOUS | Status: DC | PRN

## 2021-11-18 MED ORDER — ORAL CARE MOUTH RINSE: 15.0000 mL | Freq: Once | OROMUCOSAL | Status: AC

## 2021-11-18 MED ORDER — 0.9 % SODIUM CHLORIDE (POUR BTL) OPTIME
TOPICAL | Status: DC | PRN
  Administered 2021-11-18: 1000 mL

## 2021-11-18 MED ORDER — PHENOL 1.4 % MT LIQD: 1.0000 | OROMUCOSAL | Status: DC | PRN

## 2021-11-18 MED ORDER — SODIUM CHLORIDE 0.9 % IV SOLN: INTRAVENOUS | Status: DC

## 2021-11-18 MED ORDER — PROPOFOL 10 MG/ML IV BOLUS
INTRAVENOUS | Status: DC | PRN
  Administered 2021-11-18: 20 mg via INTRAVENOUS
  Administered 2021-11-18: 10 mg via INTRAVENOUS

## 2021-11-18 MED ORDER — METOCLOPRAMIDE HCL 5 MG/ML IJ SOLN
INTRAMUSCULAR | Status: AC
  Filled 2021-11-18: qty 2

## 2021-11-18 MED ORDER — KETOROLAC TROMETHAMINE 15 MG/ML IJ SOLN
INTRAMUSCULAR | Status: AC
  Filled 2021-11-18: qty 1

## 2021-11-18 MED ORDER — CARVEDILOL 6.25 MG PO TABS
6.2500 mg | ORAL_TABLET | Freq: Two times a day (BID) | ORAL | Status: DC
  Administered 2021-11-18 – 2021-11-19 (×2): 6.25 mg via ORAL
  Filled 2021-11-18 (×4): qty 1

## 2021-11-18 MED ORDER — GLYCOPYRROLATE PF 0.2 MG/ML IJ SOSY
PREFILLED_SYRINGE | INTRAMUSCULAR | Status: AC
  Filled 2021-11-18: qty 1

## 2021-11-18 MED ORDER — BUPIVACAINE-MELOXICAM ER 400-12 MG/14ML IJ SOLN
INTRAMUSCULAR | Status: AC
  Filled 2021-11-18: qty 1

## 2021-11-18 MED ORDER — INSULIN ASPART 100 UNIT/ML IJ SOLN
0.0000 [IU] | Freq: Three times a day (TID) | INTRAMUSCULAR | Status: DC
  Administered 2021-11-19: 2 [IU] via SUBCUTANEOUS

## 2021-11-18 MED ORDER — VANCOMYCIN HCL 1000 MG IV SOLR
INTRAVENOUS | Status: AC
Start: 2021-11-18 — End: ?
  Filled 2021-11-18: qty 20

## 2021-11-18 MED ORDER — PHENYLEPHRINE 80 MCG/ML (10ML) SYRINGE FOR IV PUSH (FOR BLOOD PRESSURE SUPPORT)
PREFILLED_SYRINGE | INTRAVENOUS | Status: AC
  Filled 2021-11-18: qty 10

## 2021-11-18 MED ORDER — EPHEDRINE 5 MG/ML INJ
INTRAVENOUS | Status: AC
Start: 2021-11-18 — End: ?
  Filled 2021-11-18: qty 5

## 2021-11-18 MED ORDER — OXYCODONE HCL ER 10 MG PO T12A
10.0000 mg | EXTENDED_RELEASE_TABLET | Freq: Two times a day (BID) | ORAL | Status: DC
  Administered 2021-11-18 – 2021-11-19 (×3): 10 mg via ORAL
  Filled 2021-11-18 (×3): qty 1

## 2021-11-18 MED ORDER — INSULIN ASPART 100 UNIT/ML IJ SOLN: 0.0000 [IU] | Freq: Every day | INTRAMUSCULAR | Status: DC

## 2021-11-18 MED ORDER — ONDANSETRON HCL 4 MG/2ML IJ SOLN: 4.0000 mg | Freq: Once | INTRAMUSCULAR | Status: DC | PRN

## 2021-11-18 MED ORDER — ACETAMINOPHEN 325 MG PO TABS: 325.0000 mg | ORAL_TABLET | Freq: Four times a day (QID) | ORAL | Status: DC | PRN

## 2021-11-18 MED ORDER — METOCLOPRAMIDE HCL 5 MG PO TABS: 5.0000 mg | ORAL_TABLET | Freq: Three times a day (TID) | ORAL | Status: DC | PRN

## 2021-11-18 MED ORDER — INSULIN ASPART 100 UNIT/ML IJ SOLN: 0.0000 [IU] | INTRAMUSCULAR | Status: DC | PRN

## 2021-11-18 MED ORDER — IRBESARTAN 300 MG PO TABS
300.0000 mg | ORAL_TABLET | Freq: Every day | ORAL | Status: DC
  Administered 2021-11-18 – 2021-11-19 (×2): 300 mg via ORAL
  Filled 2021-11-18 (×2): qty 1

## 2021-11-18 MED ORDER — MIDAZOLAM HCL 2 MG/2ML IJ SOLN
INTRAMUSCULAR | Status: AC
  Filled 2021-11-18: qty 2

## 2021-11-18 MED ORDER — HYDROMORPHONE HCL 1 MG/ML IJ SOLN: 0.5000 mg | INTRAMUSCULAR | Status: DC | PRN

## 2021-11-18 MED ORDER — CHLORHEXIDINE GLUCONATE 0.12 % MT SOLN: 15.0000 mL | Freq: Once | OROMUCOSAL | Status: AC

## 2021-11-18 MED ORDER — LIDOCAINE 2% (20 MG/ML) 5 ML SYRINGE
INTRAMUSCULAR | Status: DC | PRN
  Administered 2021-11-18: 40 mg via INTRAVENOUS
  Administered 2021-11-18: 60 mg via INTRAVENOUS

## 2021-11-18 MED ORDER — DOCUSATE SODIUM 100 MG PO CAPS
100.0000 mg | ORAL_CAPSULE | Freq: Two times a day (BID) | ORAL | Status: DC
  Administered 2021-11-18 – 2021-11-19 (×2): 100 mg via ORAL
  Filled 2021-11-18 (×2): qty 1

## 2021-11-18 MED ORDER — FENTANYL CITRATE (PF) 250 MCG/5ML IJ SOLN
INTRAMUSCULAR | Status: AC
  Filled 2021-11-18: qty 5

## 2021-11-18 MED ORDER — PRONTOSAN WOUND IRRIGATION OPTIME
TOPICAL | Status: DC | PRN
  Administered 2021-11-18: 1 via TOPICAL

## 2021-11-18 MED ORDER — KETOROLAC TROMETHAMINE 15 MG/ML IJ SOLN
15.0000 mg | Freq: Four times a day (QID) | INTRAMUSCULAR | Status: AC
  Administered 2021-11-18 – 2021-11-19 (×4): 15 mg via INTRAVENOUS
  Filled 2021-11-18 (×3): qty 1

## 2021-11-18 MED ORDER — CHLORHEXIDINE GLUCONATE 0.12 % MT SOLN
OROMUCOSAL | Status: AC
  Administered 2021-11-18: 15 mL via OROMUCOSAL
  Filled 2021-11-18: qty 15

## 2021-11-18 MED ORDER — POVIDONE-IODINE 10 % EX SWAB: 2.0000 | Freq: Once | CUTANEOUS | Status: DC

## 2021-11-18 MED ORDER — LIDOCAINE 2% (20 MG/ML) 5 ML SYRINGE
INTRAMUSCULAR | Status: AC
  Filled 2021-11-18: qty 5

## 2021-11-18 MED ORDER — ACETAMINOPHEN 500 MG PO TABS
ORAL_TABLET | ORAL | Status: AC
  Administered 2021-11-18: 1000 mg via ORAL
  Filled 2021-11-18: qty 2

## 2021-11-18 MED ORDER — LACTATED RINGERS IV SOLN: INTRAVENOUS | Status: DC

## 2021-11-18 MED ORDER — ROPIVACAINE HCL 5 MG/ML IJ SOLN
INTRAMUSCULAR | Status: DC | PRN
  Administered 2021-11-18: 15 mL via PERINEURAL

## 2021-11-18 MED ORDER — ACETAMINOPHEN 500 MG PO TABS: 1000.0000 mg | ORAL_TABLET | Freq: Once | ORAL | Status: AC

## 2021-11-18 MED ORDER — TRANEXAMIC ACID-NACL 1000-0.7 MG/100ML-% IV SOLN
1000.0000 mg | Freq: Once | INTRAVENOUS | Status: AC
  Administered 2021-11-18: 1000 mg via INTRAVENOUS
  Filled 2021-11-18: qty 100

## 2021-11-18 MED ORDER — METOCLOPRAMIDE HCL 5 MG/ML IJ SOLN
INTRAMUSCULAR | Status: DC | PRN
  Administered 2021-11-18: 10 mg via INTRAVENOUS

## 2021-11-18 MED ORDER — PHENYLEPHRINE 80 MCG/ML (10ML) SYRINGE FOR IV PUSH (FOR BLOOD PRESSURE SUPPORT)
PREFILLED_SYRINGE | INTRAVENOUS | Status: DC | PRN
  Administered 2021-11-18: 80 ug via INTRAVENOUS

## 2021-11-18 MED ORDER — FENTANYL CITRATE (PF) 100 MCG/2ML IJ SOLN
INTRAMUSCULAR | Status: DC | PRN
  Administered 2021-11-18 (×2): 50 ug via INTRAVENOUS

## 2021-11-18 MED ORDER — ONDANSETRON HCL 4 MG/2ML IJ SOLN: 4.0000 mg | Freq: Four times a day (QID) | INTRAMUSCULAR | Status: DC | PRN

## 2021-11-18 MED ORDER — ONDANSETRON HCL 4 MG PO TABS: 4.0000 mg | ORAL_TABLET | Freq: Four times a day (QID) | ORAL | Status: DC | PRN

## 2021-11-18 MED ORDER — CEFAZOLIN SODIUM-DEXTROSE 2-4 GM/100ML-% IV SOLN
2.0000 g | INTRAVENOUS | Status: AC
  Administered 2021-11-18: 2 g via INTRAVENOUS

## 2021-11-18 MED ORDER — CEFAZOLIN SODIUM-DEXTROSE 2-4 GM/100ML-% IV SOLN
INTRAVENOUS | Status: AC
  Filled 2021-11-18: qty 100

## 2021-11-18 MED ORDER — PROPOFOL 500 MG/50ML IV EMUL
INTRAVENOUS | Status: DC | PRN
  Administered 2021-11-18: 75 ug/kg/min via INTRAVENOUS

## 2021-11-18 MED ORDER — MIDAZOLAM HCL 5 MG/5ML IJ SOLN
INTRAMUSCULAR | Status: DC | PRN
  Administered 2021-11-18 (×2): 1 mg via INTRAVENOUS

## 2021-11-18 MED ORDER — SODIUM CHLORIDE 0.9 % IR SOLN
Status: DC | PRN
  Administered 2021-11-18: 1000 mL

## 2021-11-18 MED ORDER — TRANEXAMIC ACID-NACL 1000-0.7 MG/100ML-% IV SOLN
1000.0000 mg | INTRAVENOUS | Status: AC
  Administered 2021-11-18: 1000 mg via INTRAVENOUS

## 2021-11-18 MED ORDER — METOCLOPRAMIDE HCL 5 MG/ML IJ SOLN: 5.0000 mg | Freq: Three times a day (TID) | INTRAMUSCULAR | Status: DC | PRN

## 2021-11-18 MED ORDER — METFORMIN HCL 500 MG PO TABS
1000.0000 mg | ORAL_TABLET | Freq: Two times a day (BID) | ORAL | Status: DC
Start: 2021-11-18 — End: 2021-11-19
  Administered 2021-11-18 – 2021-11-19 (×2): 1000 mg via ORAL
  Filled 2021-11-18 (×2): qty 2

## 2021-11-18 MED ORDER — OXYCODONE HCL 5 MG PO TABS: 10.0000 mg | ORAL_TABLET | ORAL | Status: DC | PRN

## 2021-11-18 MED ORDER — METHOCARBAMOL 500 MG PO TABS
500.0000 mg | ORAL_TABLET | Freq: Four times a day (QID) | ORAL | Status: DC | PRN
  Administered 2021-11-18 – 2021-11-19 (×3): 500 mg via ORAL
  Filled 2021-11-18 (×2): qty 1

## 2021-11-18 MED ORDER — MENTHOL 3 MG MT LOZG: 1.0000 | LOZENGE | OROMUCOSAL | Status: DC | PRN

## 2021-11-18 MED ORDER — FENTANYL CITRATE (PF) 100 MCG/2ML IJ SOLN: 25.0000 ug | INTRAMUSCULAR | Status: DC | PRN

## 2021-11-18 MED ORDER — OXYCODONE HCL 5 MG PO TABS
5.0000 mg | ORAL_TABLET | ORAL | Status: DC | PRN
  Administered 2021-11-18: 10 mg via ORAL

## 2021-11-18 MED ORDER — BUPIVACAINE-MELOXICAM ER 400-12 MG/14ML IJ SOLN
INTRAMUSCULAR | Status: DC | PRN
  Administered 2021-11-18: 400 mg

## 2021-11-18 MED ORDER — ACETAMINOPHEN 500 MG PO TABS
1000.0000 mg | ORAL_TABLET | Freq: Four times a day (QID) | ORAL | Status: DC
  Administered 2021-11-18 – 2021-11-19 (×3): 1000 mg via ORAL
  Filled 2021-11-18 (×4): qty 2

## 2021-11-18 MED ORDER — OXYCODONE HCL 5 MG PO TABS
ORAL_TABLET | ORAL | Status: AC
  Filled 2021-11-18: qty 2

## 2021-11-18 SURGICAL SUPPLY — 84 items
ALCOHOL 70% 16 OZ (MISCELLANEOUS) ×2 IMPLANT
BAG COUNTER SPONGE SURGICOUNT (BAG) IMPLANT
BAG DECANTER FOR FLEXI CONT (MISCELLANEOUS) ×2 IMPLANT
BANDAGE ESMARK 6X9 LF (GAUZE/BANDAGES/DRESSINGS) IMPLANT
BLADE SAG 18X100X1.27 (BLADE) ×2 IMPLANT
BNDG ESMARK 6X9 LF (GAUZE/BANDAGES/DRESSINGS)
BOWL SMART MIX CTS (DISPOSABLE) ×2 IMPLANT
CEMENT BONE REFOBACIN R1X40 US (Cement) ×2 IMPLANT
CLSR STERI-STRIP ANTIMIC 1/2X4 (GAUZE/BANDAGES/DRESSINGS) ×4 IMPLANT
COMP FEM CEMT PERSONA SZ9 LT (Knees) ×2 IMPLANT
COMPONENT FEM CEMT PRNSA SZ9LT (Knees) IMPLANT
COOLER ICEMAN CLASSIC (MISCELLANEOUS) ×2 IMPLANT
COVER SURGICAL LIGHT HANDLE (MISCELLANEOUS) ×2 IMPLANT
CUFF TOURN SGL QUICK 34 (TOURNIQUET CUFF) ×1
CUFF TOURN SGL QUICK 42 (TOURNIQUET CUFF) IMPLANT
CUFF TRNQT CYL 34X4.125X (TOURNIQUET CUFF) ×1 IMPLANT
DERMABOND ADVANCED (GAUZE/BANDAGES/DRESSINGS) ×1
DERMABOND ADVANCED .7 DNX12 (GAUZE/BANDAGES/DRESSINGS) ×1 IMPLANT
DRAPE EXTREMITY T 121X128X90 (DISPOSABLE) ×2 IMPLANT
DRAPE HALF SHEET 40X57 (DRAPES) ×2 IMPLANT
DRAPE INCISE IOBAN 66X45 STRL (DRAPES) ×2 IMPLANT
DRAPE ORTHO SPLIT 77X108 STRL (DRAPES) ×2
DRAPE POUCH INSTRU U-SHP 10X18 (DRAPES) ×2 IMPLANT
DRAPE SURG ORHT 6 SPLT 77X108 (DRAPES) ×2 IMPLANT
DRAPE U-SHAPE 47X51 STRL (DRAPES) ×4 IMPLANT
DRSG AQUACEL AG ADV 3.5X10 (GAUZE/BANDAGES/DRESSINGS) ×2 IMPLANT
DRSG AQUACEL AG ADV 3.5X14 (GAUZE/BANDAGES/DRESSINGS) ×1 IMPLANT
DURAPREP 26ML APPLICATOR (WOUND CARE) ×6 IMPLANT
ELECT CAUTERY BLADE 6.4 (BLADE) ×2 IMPLANT
ELECT REM PT RETURN 9FT ADLT (ELECTROSURGICAL) ×2
ELECTRODE REM PT RTRN 9FT ADLT (ELECTROSURGICAL) ×1 IMPLANT
GLOVE BIOGEL PI IND STRL 7.0 (GLOVE) ×1 IMPLANT
GLOVE BIOGEL PI IND STRL 7.5 (GLOVE) ×4 IMPLANT
GLOVE BIOGEL PI INDICATOR 7.0 (GLOVE) ×1
GLOVE BIOGEL PI INDICATOR 7.5 (GLOVE) ×4
GLOVE ECLIPSE 7.0 STRL STRAW (GLOVE) ×6 IMPLANT
GLOVE SKINSENSE NS SZ7.5 (GLOVE) ×3
GLOVE SKINSENSE STRL SZ7.5 (GLOVE) ×3 IMPLANT
GLOVE SURG UNDER LTX SZ7.5 (GLOVE) ×4 IMPLANT
GLOVE SURG UNDER POLY LF SZ7 (GLOVE) ×4 IMPLANT
GOWN STRL REIN XL XLG (GOWN DISPOSABLE) ×2 IMPLANT
GOWN STRL REUS W/ TWL LRG LVL3 (GOWN DISPOSABLE) ×1 IMPLANT
GOWN STRL REUS W/TWL LRG LVL3 (GOWN DISPOSABLE) ×1
HANDPIECE INTERPULSE COAX TIP (DISPOSABLE) ×1
HDLS TROCR DRIL PIN KNEE 75 (PIN) ×1
HOOD PEEL AWAY FLYTE STAYCOOL (MISCELLANEOUS) ×4 IMPLANT
INSERT TIB KNEE EF/8-11 20 LT (Liner) ×1 IMPLANT
JET LAVAGE IRRISEPT WOUND (IRRIGATION / IRRIGATOR) ×2
KIT BASIN OR (CUSTOM PROCEDURE TRAY) ×2 IMPLANT
KIT TURNOVER KIT B (KITS) ×2 IMPLANT
LAVAGE JET IRRISEPT WOUND (IRRIGATION / IRRIGATOR) ×1 IMPLANT
MANIFOLD NEPTUNE II (INSTRUMENTS) ×2 IMPLANT
MARKER SKIN DUAL TIP RULER LAB (MISCELLANEOUS) ×4 IMPLANT
NDL SPNL 18GX3.5 QUINCKE PK (NEEDLE) ×1 IMPLANT
NEEDLE SPNL 18GX3.5 QUINCKE PK (NEEDLE) ×2 IMPLANT
NS IRRIG 1000ML POUR BTL (IV SOLUTION) ×2 IMPLANT
PACK TOTAL JOINT (CUSTOM PROCEDURE TRAY) ×2 IMPLANT
PAD ARMBOARD 7.5X6 YLW CONV (MISCELLANEOUS) ×4 IMPLANT
PAD COLD SHLDR WRAP-ON (PAD) ×2 IMPLANT
PIN DRILL HDLS TROCAR 75 4PK (PIN) IMPLANT
SAW OSC TIP CART 19.5X105X1.3 (SAW) ×2 IMPLANT
SCREW FEMALE HEX FIX 25X2.5 (ORTHOPEDIC DISPOSABLE SUPPLIES) ×1 IMPLANT
SET HNDPC FAN SPRY TIP SCT (DISPOSABLE) ×1 IMPLANT
SOLUTION PRONTOSAN WOUND 350ML (IRRIGATION / IRRIGATOR) ×1 IMPLANT
STAPLER VISISTAT 35W (STAPLE) IMPLANT
STEM POLY PAT PLY 35M KNEE (Knees) ×1 IMPLANT
STEM TIB ST PERS 14+30 (Stem) ×1 IMPLANT
STEM TIBIA 5 DEG SZ F L KNEE (Knees) IMPLANT
SUCTION FRAZIER HANDLE 10FR (MISCELLANEOUS) ×1
SUCTION TUBE FRAZIER 10FR DISP (MISCELLANEOUS) ×1 IMPLANT
SUT ETHILON 2 0 FS 18 (SUTURE) ×5 IMPLANT
SUT MNCRL AB 3-0 PS2 27 (SUTURE) IMPLANT
SUT VIC AB 0 CT1 27 (SUTURE) ×2
SUT VIC AB 0 CT1 27XBRD ANBCTR (SUTURE) ×2 IMPLANT
SUT VIC AB 1 CTX 27 (SUTURE) ×6 IMPLANT
SUT VIC AB 2-0 CT1 27 (SUTURE) ×4
SUT VIC AB 2-0 CT1 TAPERPNT 27 (SUTURE) ×4 IMPLANT
SYR 50ML LL SCALE MARK (SYRINGE) ×4 IMPLANT
TIBIA STEM 5 DEG SZ F L KNEE (Knees) ×2 IMPLANT
TOWEL GREEN STERILE (TOWEL DISPOSABLE) ×2 IMPLANT
TOWEL GREEN STERILE FF (TOWEL DISPOSABLE) ×2 IMPLANT
TRAY CATH 16FR W/PLASTIC CATH (SET/KITS/TRAYS/PACK) IMPLANT
UNDERPAD 30X36 HEAVY ABSORB (UNDERPADS AND DIAPERS) ×2 IMPLANT
YANKAUER SUCT BULB TIP NO VENT (SUCTIONS) ×4 IMPLANT

## 2021-11-18 NOTE — Op Note (Signed)
Total Knee Arthroplasty Procedure Note  Preoperative diagnosis: Left knee osteoarthritis  Postoperative diagnosis:same  Operative procedure: Left total knee arthroplasty. CPT [phone]  Surgeon: N. Eduard Roux, MD  Assist: Madalyn Rob, PA-C; necessary for the timely completion of procedure and due to complexity of procedure.  Anesthesia: Spinal, regional, local  Tourniquet time: see anesthesia record  Implants used: Zimmer persona Femur: CR 9 Tibia: F, 30 mm stem Patella: 35 mm Polyethylene: 20 mm, MC  Indication: [name] [name] is a 66 y.o. year old female with a history of knee pain. Having failed conservative management, the patient elected to proceed with a total knee arthroplasty.  We have reviewed the risk and benefits of the surgery and they elected to proceed after voicing understanding.  Procedure:  After informed consent was obtained and understanding of the risk were voiced including but not limited to bleeding, infection, damage to surrounding structures including nerves and vessels, blood clots, leg length inequality and the failure to achieve desired results, the operative extremity was marked with verbal confirmation of the patient in the holding area.   The patient was then brought to the operating room and transported to the operating room table in the supine position.  A tourniquet was applied to the operative extremity around the upper thigh. The operative limb was then prepped and draped in the usual sterile fashion and preoperative antibiotics were administered.  A time out was performed prior to the start of surgery confirming the correct extremity, preoperative antibiotic administration, as well as team members, implants and instruments available for the case. Correct surgical site was also confirmed with preoperative radiographs. The limb was then elevated for exsanguination and the tourniquet was inflated. A midline incision was made and a standard medial  parapatellar approach was performed.  The infrapatellar fat pad was removed.  Suprapatellar synovium was removed to reveal the anterior distal femoral cortex.  A medial peel was performed to release the capsule of the medial tibial plateau.  The patella was then everted and was prepared and sized to a 35 mm.  A cover was placed on the patella for protection from retractors.  The knee was then brought into full flexion and we then turned our attention to the femur.  The cruciates were sacrificed.  Start site was drilled in the femur and the intramedullary distal femoral cutting guide was placed, set at 5 degrees valgus, taking 10 mm of distal resection. The distal cut was made. Osteophytes were then removed.  Next, the proximal tibial cutting guide was placed with appropriate slope, varus/valgus alignment and depth of resection. The proximal tibial cut was made taking 4 mm off the low side. Gap blocks were then used to assess the extension gap and alignment, and appropriate soft tissue releases were performed. Attention was turned back to the femur, which was sized using the sizing guide to a size 9. Appropriate rotation of the femoral component was determined using epicondylar axis, Whiteside's line, and assessing the flexion gap under ligament tension. The appropriate size 4-in-1 cutting block was placed and checked with an angel wing and cuts were made. Posterior femoral osteophytes and uncapped bone were then removed with the curved osteotome.  Trial components were placed, and stability was checked in full extension, mid-flexion, and deep flexion. Proper tibial rotation was determined and marked.  The patella tracked well without a lateral release.  The femoral lugs were then drilled. Trial components were then removed and tibial preparation performed.  The tibia was sized  for a size F with 30 mm stem component due to soft bone. The bony surfaces were irrigated with a pulse lavage and then dried. Bone cement  was vacuum mixed on the back table, and the final components sized above were cemented into place.  Antibiotic irrigation was placed in the knee joint and soft tissues while the cement cured.  After cement had finished curing, excess cement was removed. The stability of the construct was re-evaluated throughout a range of motion and found to be acceptable. The trial liner was removed, the knee was copiously irrigated, and the knee was re-evaluated for any excess bone debris. The real polyethylene liner, 20 mm thick, was inserted and checked to ensure the locking mechanism had engaged appropriately. The tourniquet was deflated and hemostasis was achieved. The wound was irrigated with normal saline.  One gram of vancomycin powder was placed in the surgical bed.  Topical 0.25% bupivacaine and meloxicam was placed in the joint for postoperative pain.  Capsular closure was performed with a #1 vicryl, subcutaneous fat closed with a 0 vicryl suture, then subcutaneous tissue closed with interrupted 2.0 vicryl suture. The skin was then closed with a 2.0 nylon and dermabond. A sterile dressing was applied.  The patient was awakened in the operating room and taken to recovery in stable condition. All sponge, needle, and instrument counts were correct at the end of the case.  [name] was necessary for opening, closing, retracting, limb positioning and overall facilitation and completion of the surgery.  Position: supine  Complications: none.  Time Out: performed   Drains/Packing: none  Estimated blood loss: minimal  Returned to Recovery Room: in good condition.   Antibiotics: yes   Mechanical VTE (DVT) Prophylaxis: sequential compression devices, TED thigh-high  Chemical VTE (DVT) Prophylaxis: xarelto  Fluid Replacement  Crystalloid: see anesthesia record Blood: none  FFP: none   Specimens Removed: 1 to pathology   Sponge and Instrument Count Correct? yes   PACU: portable radiograph - knee  AP and Lateral   Plan/RTC: Return in 2 weeks for wound check.   Weight Bearing/Load Lower Extremity: full   Implant Name Type Inv. Item Serial No. Manufacturer Lot No. LRB No. Used Action  CEMENT BONE REFOBACIN [id number] [place] - [id number] Cement CEMENT BONE REFOBACIN R1X40 [place]  ZIMMER RECON(ORTH,TRAU,BIO,SG) [id number] Left 2 Implanted  STEM POLY PAT PLY 44M KNEE - [id number] Knees STEM POLY PAT PLY 44M KNEE  ZIMMER RECON(ORTH,TRAU,BIO,SG) [id number] Left 1 Implanted  COMP FEM CEMT PERSONA SZ9 LT - [id number] Knees COMP FEM CEMT PERSONA SZ9 LT  ZIMMER RECON(ORTH,TRAU,BIO,SG) [id number] Left 1 Implanted  TIBIA STEM 5 DEG SZ F L KNEE - [id number] Knees TIBIA STEM 5 DEG SZ F L KNEE  ZIMMER RECON(ORTH,TRAU,BIO,SG) [id number] Left 1 Implanted  STEM TIB ST PERS 14+30 - [id number] Stem STEM TIB ST PERS 14+30  ZIMMER RECON(ORTH,TRAU,BIO,SG) [id number] Left 1 Implanted  Persona The Personalized Knee System Vivacit-E 56m Left    ZIMMER KNEE [id number] 1 Implanted    N. MEduard Roux MD [facility]9:04 AM

## 2021-11-18 NOTE — Transfer of Care (Signed)
Immediate Anesthesia Transfer of Care Note  [name]  Procedure(s) Performed: LEFT TOTAL KNEE ARTHROPLASTY (Left: Knee)  Patient Location: PACU  Anesthesia Type:Spinal and MAC combined with regional for post-op pain  Level of Consciousness: drowsy and patient cooperative  Airway & Oxygen Therapy: Patient Spontanous Breathing and Patient connected to nasal cannula oxygen  Post-op Assessment: Report given to RN, Post -op Vital signs reviewed and stable and Patient moving all extremities  Post vital signs: Reviewed and stable  Last Vitals:  Vitals Value Taken Time  BP 91/64 11/18/21 0953  Temp    Pulse 78 11/18/21 0956  Resp 23 11/18/21 0956  SpO2 94 % 11/18/21 0956  Vitals shown include unvalidated device data.  Last Pain:  Vitals:   11/18/21 0559  TempSrc:   PainSc: 0-No pain         Complications: No notable events documented.

## 2021-11-18 NOTE — Evaluation (Signed)
Physical Therapy Evaluation Patient Details Name: [name] [name] MRN: [id number] DOB: 1956-03-30 Today's Date: 11/18/2021  History of Present Illness  Pt is a 66 y/o female s/p L TKA. PMH includes breast cancer.  Clinical Impression  Pt is s/p surgery above with deficits below. Pt requiring min A to stand and take side steps at EOB. Pt becoming shaky and with increased pain, so further mobility deferred. Anticipate pt will progress well once pain controlled. Reviewed knee precautions with pt. Pt reports daughter can assist as needed. Will continue to follow acutely.        Recommendations for follow up therapy are one component of a multi-disciplinary discharge planning process, led by the attending physician.  Recommendations may be updated based on patient status, additional functional criteria and insurance authorization.  Follow Up Recommendations Follow physician's recommendations for discharge plan and follow up therapies      Assistance Recommended at Discharge Intermittent Supervision/Assistance  Patient can return home with the following  Help with stairs or ramp for entrance;Assist for transportation;Assistance with cooking/housework    Equipment Recommendations BSC/3in1  Recommendations for Other Services       Functional Status Assessment Patient has had a recent decline in their functional status and demonstrates the ability to make significant improvements in function in a reasonable and predictable amount of time.     Precautions / Restrictions Precautions Precautions: Knee Precaution Booklet Issued: No Precaution Comments: Reviewed knee precautions with pt Restrictions Weight Bearing Restrictions: Yes LLE Weight Bearing: Weight bearing as tolerated      Mobility  Bed Mobility Overal bed mobility: Needs Assistance Bed Mobility: Supine to Sit, Sit to Supine     Supine to sit: Min assist Sit to supine: Min assist   General bed mobility comments: assist for  LLE management throughout. Increased time secondary to pain.    Transfers Overall transfer level: Needs assistance Equipment used: Rolling walker (2 wheels) Transfers: Sit to/from Stand Sit to Stand: Min assist, From elevated surface           General transfer comment: Min A for lift assist and steadying to stand. Took side steps at EOB for repositioning. Pt with increased shakiness and pain, so further mobility deferred.    Ambulation/Gait                  Stairs            Wheelchair Mobility    Modified Rankin (Stroke Patients Only)       Balance Overall balance assessment: Needs assistance Sitting-balance support: No upper extremity supported, Feet supported Sitting balance-Leahy Scale: Fair     Standing balance support: Bilateral upper extremity supported Standing balance-Leahy Scale: Poor Standing balance comment: Reliant on BUE support                             Pertinent Vitals/Pain Pain Assessment Pain Assessment: 0-10 Pain Score: 7  Pain Location: L knee Pain Descriptors / Indicators: Grimacing, Guarding Pain Intervention(s): Limited activity within patient's tolerance, Monitored during session, Repositioned    Home Living Family/patient expects to be discharged to:: Private residence Living Arrangements: Children Available Help at Discharge: Family Type of Home: House Home Access: Stairs to enter Entrance Stairs-Rails: None Entrance Stairs-Number of Steps: 1   Home Layout: One level Home Equipment: Conservation officer, nature (2 wheels);Cane - single point      Prior Function Prior Level of Function : Independent/Modified Independent  Mobility Comments: Uses cane for ambulation       Hand Dominance        Extremity/Trunk Assessment   Upper Extremity Assessment Upper Extremity Assessment: Defer to OT evaluation    Lower Extremity Assessment Lower Extremity Assessment: LLE deficits/detail LLE Deficits /  Details: deficits consistent with post op pain and weakness.    Cervical / Trunk Assessment Cervical / Trunk Assessment: Normal  Communication   Communication: No difficulties  Cognition Arousal/Alertness: Awake/alert Behavior During Therapy: WFL for tasks assessed/performed Overall Cognitive Status: Within Functional Limits for tasks assessed                                          General Comments      Exercises Total Joint Exercises Ankle Circles/Pumps: AROM, Both, 10 reps   Assessment/Plan    PT Assessment Patient needs continued PT services  PT Problem List Decreased strength;Decreased range of motion;Decreased activity tolerance;Decreased balance;Decreased mobility;Decreased knowledge of use of DME;Pain       PT Treatment Interventions DME instruction;Gait training;Stair training;Functional mobility training;Therapeutic activities;Therapeutic exercise;Balance training;Patient/family education    PT Goals (Current goals can be found in the Care Plan section)  Acute Rehab PT Goals Patient Stated Goal: to decrease pain PT Goal Formulation: With patient Time For Goal Achievement: 12/02/21 Potential to Achieve Goals: Good    Frequency 7X/week     Co-evaluation               AM-PAC PT "6 Clicks" Mobility  Outcome Measure Help needed turning from your back to your side while in a flat bed without using bedrails?: None Help needed moving from lying on your back to sitting on the side of a flat bed without using bedrails?: A Little Help needed moving to and from a bed to a chair (including a wheelchair)?: A Little Help needed standing up from a chair using your arms (e.g., wheelchair or bedside chair)?: A Little Help needed to walk in hospital room?: A Little Help needed climbing 3-5 steps with a railing? : A Lot 6 Click Score: 18    End of Session   Activity Tolerance: Patient limited by pain Patient left: in bed;with call bell/phone within  reach;with nursing/sitter in room Nurse Communication: Mobility status PT Visit Diagnosis: Other abnormalities of gait and mobility (R26.89);Difficulty in walking, not elsewhere classified (R26.2);Pain Pain - Right/Left: Left Pain - part of body: Knee    Time: 1031-5945 PT Time Calculation (min) (ACUTE ONLY): 15 min   Charges:   PT Evaluation $PT Eval Low Complexity: 1 Low          Lou Miner, DPT  Acute Rehabilitation Services  Office: [phone]   Rudean Hitt 11/18/2021, 4:29 PM

## 2021-11-18 NOTE — Plan of Care (Signed)

## 2021-11-18 NOTE — Anesthesia Procedure Notes (Signed)
Anesthesia Regional Block: Adductor canal block   Pre-Anesthetic Checklist: , timeout performed,  Correct Patient, Correct Site, Correct Laterality,  Correct Procedure, Correct Position, site marked,  Risks and benefits discussed,  Surgical consent,  Pre-op evaluation,  At surgeon's request and post-op pain management  Laterality: Left  Prep: chloraprep       Needles:  Injection technique: Single-shot  Needle Type: Echogenic Needle     Needle Length: 9cm  Needle Gauge: 21     Additional Needles:   Procedures:,,,, ultrasound used (permanent image in chart),,    Narrative:  Start time: 11/18/2021 6:43 AM End time: 11/18/2021 6:48 AM Injection made incrementally with aspirations every 5 mL.  Performed by: Personally  Anesthesiologist: Santa Lighter, MD  Additional Notes: No pain on injection. No increased resistance to injection. Injection made in 5cc increments.  Good needle visualization.  Patient tolerated procedure well.

## 2021-11-18 NOTE — Discharge Instructions (Addendum)
INSTRUCTIONS AFTER JOINT REPLACEMENT   Remove items at home which could result in a fall. This includes throw rugs or furniture in walking pathways ICE to the affected joint every three hours while awake for 30 minutes at a time, for at least the first 3-5 days, and then as needed for pain and swelling.  Continue to use ice for pain and swelling. You may notice swelling that will progress down to the foot and ankle.  This is normal after surgery.  Elevate your leg when you are not up walking on it.   Continue to use the breathing machine you got in the hospital (incentive spirometer) which will help keep your temperature down.  It is common for your temperature to cycle up and down following surgery, especially at night when you are not up moving around and exerting yourself.  The breathing machine keeps your lungs expanded and your temperature down.   DIET:  As you were doing prior to hospitalization, we recommend a well-balanced diet.  DRESSING / WOUND CARE / SHOWERING  Keep the surgical dressing until follow up.  The dressing is water proof, so you can shower without any extra covering.  IF THE DRESSING FALLS OFF or the wound gets wet inside, change the dressing with sterile gauze.  Please use good hand washing techniques before changing the dressing.  Do not use any lotions or creams on the incision until instructed by your surgeon.    ACTIVITY  Increase activity slowly as tolerated, but follow the weight bearing instructions below.   No driving for 6 weeks or until further direction given by your physician.  You cannot drive while taking narcotics.  No lifting or carrying greater than 10 lbs. until further directed by your surgeon. Avoid periods of inactivity such as sitting longer than an hour when not asleep. This helps prevent blood clots.  You may return to work once you are authorized by your doctor.     WEIGHT BEARING   Weight bearing as tolerated with assist device (walker, cane,  etc) as directed, use it as long as suggested by your surgeon or therapist, typically at least 4-6 weeks.   EXERCISES  Results after joint replacement surgery are often greatly improved when you follow the exercise, range of motion and muscle strengthening exercises prescribed by your doctor. Safety measures are also important to protect the joint from further injury. Any time any of these exercises cause you to have increased pain or swelling, decrease what you are doing until you are comfortable again and then slowly increase them. If you have problems or questions, call your caregiver or physical therapist for advice.   Rehabilitation is important following a joint replacement. After just a few days of immobilization, the muscles of the leg can become weakened and shrink (atrophy).  These exercises are designed to build up the tone and strength of the thigh and leg muscles and to improve motion. Often times heat used for twenty to thirty minutes before working out will loosen up your tissues and help with improving the range of motion but do not use heat for the first two weeks following surgery (sometimes heat can increase post-operative swelling).   These exercises can be done on a training (exercise) mat, on the floor, on a table or on a bed. Use whatever works the best and is most comfortable for you.    Use music or television while you are exercising so that the exercises are a pleasant break in your   day. This will make your life better with the exercises acting as a break in your routine that you can look forward to.   Perform all exercises about fifteen times, three times per day or as directed.  You should exercise both the operative leg and the other leg as well.  Exercises include:   Quad Sets - Tighten up the muscle on the front of the thigh (Quad) and hold for 5-10 seconds.   Straight Leg Raises - With your knee straight (if you were given a brace, keep it on), lift the leg to 60  degrees, hold for 3 seconds, and slowly lower the leg.  Perform this exercise against resistance later as your leg gets stronger.  Leg Slides: Lying on your back, slowly slide your foot toward your buttocks, bending your knee up off the floor (only go as far as is comfortable). Then slowly slide your foot back down until your leg is flat on the floor again.  Angel Wings: Lying on your back spread your legs to the side as far apart as you can without causing discomfort.  Hamstring Strength:  Lying on your back, push your heel against the floor with your leg straight by tightening up the muscles of your buttocks.  Repeat, but this time bend your knee to a comfortable angle, and push your heel against the floor.  You may put a pillow under the heel to make it more comfortable if necessary.   A rehabilitation program following joint replacement surgery can speed recovery and prevent re-injury in the future due to weakened muscles. Contact your doctor or a physical therapist for more information on knee rehabilitation.    CONSTIPATION  Constipation is defined medically as fewer than three stools per week and severe constipation as less than one stool per week.  Even if you have a regular bowel pattern at home, your normal regimen is likely to be disrupted due to multiple reasons following surgery.  Combination of anesthesia, postoperative narcotics, change in appetite and fluid intake all can affect your bowels.   YOU MUST use at least one of the following options; they are listed in order of increasing strength to get the job done.  They are all available over the counter, and you may need to use some, POSSIBLY even all of these options:    Drink plenty of fluids (prune juice may be helpful) and high fiber foods Colace 100 mg by mouth twice a day  Senokot for constipation as directed and as needed Dulcolax (bisacodyl), take with full glass of water  Miralax (polyethylene glycol) once or twice a day as  needed.  If you have tried all these things and are unable to have a bowel movement in the first 3-4 days after surgery call either your surgeon or your primary doctor.    If you experience loose stools or diarrhea, hold the medications until you stool forms back up.  If your symptoms do not get better within 1 week or if they get worse, check with your doctor.  If you experience "the worst abdominal pain ever" or develop nausea or vomiting, please contact the office immediately for further recommendations for treatment.   ITCHING:  If you experience itching with your medications, try taking only a single pain pill, or even half a pain pill at a time.  You can also use Benadryl over the counter for itching or also to help with sleep.   TED HOSE STOCKINGS:  Use stockings on both   legs until for at least 2 weeks or as directed by physician office. They may be removed at night for sleeping.  MEDICATIONS:  See your medication summary on the "After Visit Summary" that nursing will review with you.  You may have some home medications which will be placed on hold until you complete the course of blood thinner medication.  It is important for you to complete the blood thinner medication as prescribed.  PRECAUTIONS:  If you experience chest pain or shortness of breath - call 911 immediately for transfer to the hospital emergency department.   If you develop a fever greater that 101 F, purulent drainage from wound, increased redness or drainage from wound, foul odor from the wound/dressing, or calf pain - CONTACT YOUR SURGEON.                                                   FOLLOW-UP APPOINTMENTS:  If you do not already have a post-op appointment, please call the office for an appointment to be seen by your surgeon.  Guidelines for how soon to be seen are listed in your "After Visit Summary", but are typically between 1-4 weeks after surgery.  OTHER INSTRUCTIONS:   Knee Replacement:  Do not place pillow  under knee, focus on keeping the knee straight while resting. CPM instructions: 0-90 degrees, 2 hours in the morning, 2 hours in the afternoon, and 2 hours in the evening. Place foam block, curve side up under heel at all times except when in CPM or when walking.  DO NOT modify, tear, cut, or change the foam block in any way.  POST-OPERATIVE OPIOID TAPER INSTRUCTIONS: It is important to wean off of your opioid medication as soon as possible. If you do not need pain medication after your surgery it is ok to stop day one. Opioids include: Codeine, Hydrocodone(Norco, Vicodin), Oxycodone(Percocet, oxycontin) and hydromorphone amongst others.  Long term and even short term use of opiods can cause: Increased pain response Dependence Constipation Depression Respiratory depression And more.  Withdrawal symptoms can include Flu like symptoms Nausea, vomiting And more Techniques to manage these symptoms Hydrate well Eat regular healthy meals Stay active Use relaxation techniques(deep breathing, meditating, yoga) Do Not substitute Alcohol to help with tapering If you have been on opioids for less than two weeks and do not have pain than it is ok to stop all together.  Plan to wean off of opioids This plan should start within one week post op of your joint replacement. Maintain the same interval or time between taking each dose and first decrease the dose.  Cut the total daily intake of opioids by one tablet each day Next start to increase the time between doses. The last dose that should be eliminated is the evening dose.   MAKE SURE YOU:  Understand these instructions.  Get help right away if you are not doing well or get worse.    Thank you for letting us be a part of your medical care team.  It is a privilege we respect greatly.  We hope these instructions will help you stay on track for a fast and full recovery!     Information on my medicine - XARELTO (Rivaroxaban)  This  medication education was reviewed with me or my healthcare representative as part of my discharge preparation.      Why was Xarelto prescribed for you? Xarelto was prescribed for you to reduce the risk of blood clots forming after orthopedic surgery. The medical term for these abnormal blood clots is venous thromboembolism (VTE).  What do you need to know about xarelto ? Take your Xarelto ONCE DAILY at the same time every day. You may take it either with or without food.  If you have difficulty swallowing the tablet whole, you may crush it and mix in applesauce just prior to taking your dose.  Take Xarelto exactly as prescribed by your doctor and DO NOT stop taking Xarelto without talking to the doctor who prescribed the medication.  Stopping without other VTE prevention medication to take the place of Xarelto may increase your risk of developing a clot.  After discharge, you should have regular check-up appointments with your healthcare provider that is prescribing your Xarelto.    What do you do if you miss a dose? If you miss a dose, take it as soon as you remember on the same day then continue your regularly scheduled once daily regimen the next day. Do not take two doses of Xarelto on the same day.   Important Safety Information A possible side effect of Xarelto is bleeding. You should call your healthcare provider right away if you experience any of the following: Bleeding from an injury or your nose that does not stop. Unusual colored urine (red or dark brown) or unusual colored stools (red or black). Unusual bruising for unknown reasons. A serious fall or if you hit your head (even if there is no bleeding).  Some medicines may interact with Xarelto and might increase your risk of bleeding while on Xarelto. To help avoid this, consult your healthcare provider or pharmacist prior to using any new prescription or non-prescription medications, including herbals, vitamins,  non-steroidal anti-inflammatory drugs (NSAIDs) and supplements.  This website has more information on Xarelto: www.xarelto.com.   

## 2021-11-18 NOTE — Anesthesia Procedure Notes (Signed)
Procedure Name: MAC Date/Time: 11/18/2021 7:35 AM  Performed by: Moshe Salisbury, CRNAPre-anesthesia Checklist: Patient identified, Emergency Drugs available, Suction available and Patient being monitored Patient Re-evaluated:Patient Re-evaluated prior to induction Oxygen Delivery Method: Nasal cannula Dental Injury: Teeth and Oropharynx as per pre-operative assessment

## 2021-11-18 NOTE — Anesthesia Procedure Notes (Signed)
Spinal  Patient location during procedure: OR Start time: 11/18/2021 7:25 AM End time: 11/18/2021 7:32 AM Reason for block: surgical anesthesia Staffing Performed: anesthesiologist  Anesthesiologist: Santa Lighter, MD Performed by: Santa Lighter, MD Authorized by: Santa Lighter, MD   Preanesthetic Checklist Completed: patient identified, IV checked, risks and benefits discussed, surgical consent, monitors and equipment checked, pre-op evaluation and timeout performed Spinal Block Patient position: sitting Prep: DuraPrep and site prepped and draped Patient monitoring: continuous pulse ox and blood pressure Approach: midline Location: L3-4 Injection technique: single-shot Needle Needle type: Pencan and Quincke  Needle gauge: 22 G Assessment Events: CSF return Additional Notes Functioning IV was confirmed and monitors were applied. Sterile prep and drape, including hand hygiene, mask and sterile gloves were used. The patient was positioned and the spine was prepped. The skin was anesthetized with lidocaine.  Free flow of clear CSF was obtained prior to injecting local anesthetic into the CSF.  The spinal needle aspirated freely following injection.  The needle was carefully withdrawn.  The patient tolerated the procedure well. Consent was obtained prior to procedure with all questions answered and concerns addressed. Risks including but not limited to bleeding, infection, nerve damage, paralysis, failed block, inadequate analgesia, allergic reaction, high spinal, itching and headache were discussed and the patient wished to proceed.   Hoy Morn, MD

## 2021-11-18 NOTE — H&P (Signed)
PREOPERATIVE H&P  Chief Complaint: left knee degenerative joint disease  HPI: [name] [name] is a 66 y.o. female who presents for surgical treatment of left knee degenerative joint disease.  She denies any changes in medical history.  Past Medical History:  Diagnosis Date   Arthritis    Breast cancer ([place])    Cancer ([place])    breast   Diabetes mellitus without complication ([place])    History of MRI    Knee, Hip, and Back.   Hypertension    Personal history of chemotherapy    Personal history of radiation therapy    Past Surgical History:  Procedure Laterality Date   BREAST BIOPSY Right 02/03/2020   x2   BREAST BIOPSY Right 02/06/2020   x2   BREAST LUMPECTOMY Right 07/26/2020   BREAST LUMPECTOMY WITH RADIOACTIVE SEED AND SENTINEL LYMPH NODE BIOPSY Right 07/26/2020   Procedure: RIGHT BREAST LUMPECTOMY WITH RADIOACTIVE SEED X 2 AND SENTINEL LYMPH NODE BIOPSY;  Surgeon: Erroll Luna, MD;  Location: [facility];  Service: General;  Laterality: Right;   IR IMAGING GUIDED PORT INSERTION  02/23/2020   PORT-A-CATH REMOVAL N/A 07/26/2020   Procedure: REMOVAL PORT-A-CATH;  Surgeon: Erroll Luna, MD;  Location: [facility];  Service: General;  Laterality: N/A;   Social History   Socioeconomic History   Marital status: Single    Spouse name: Not on file   Number of children: 1   Years of education: Not on file   Highest education level: 9th grade  Occupational History   Not on file  Tobacco Use   Smoking status: Never   Smokeless tobacco: Never  Vaping Use   Vaping Use: Never used  Substance and Sexual Activity   Alcohol use: Yes    Alcohol/week: 1.0 standard drink of alcohol    Types: 1 Glasses of wine per week    Comment: occasionally   Drug use: Never   Sexual activity: Not Currently  Other Topics Concern   Not on file  Social History Narrative   Tobacco use, amount per day now:   Past tobacco use, amount per day:   How many years  did you use tobacco:   Alcohol use (drinks per week): 1 drink occasionally.   Diet:   Do you drink/eat things with caffeine: Yes   Marital status:  Single                                What year were you married?   Do you live in a house, apartment, assisted living, condo, trailer, etc.? House   Is it one or more stories? One   How many persons live in your home? 5   Do you have pets in your home?( please list) Cat   Highest Level of education completed: High School.   Current or past profession: Armed forces operational officer.   Do you exercise?  Yes                                   Type and how often? 2 times weekly.   Do you have a living will? No   Do you have a DNR form?    No  If not, do you want to discuss one?   Do you have signed POA/HPOA forms?     No                   If so, please bring to you appointment    Do you have any difficulty bathing or dressing yourself? No   Do you have difficulty preparing food or eating? No   Do you have difficulty managing your medications? No   Do you have any difficulty managing your finances? No   Do you have any difficulty affording your medications? No          Social Determinants of Health   Financial Resource Strain: Not on file  Food Insecurity: No Food Insecurity (05/21/2021)   Hunger Vital Sign    Worried About Running Out of Food in the Last Year: Never true    Ran Out of Food in the Last Year: Never true  Transportation Needs: No Transportation Needs (05/21/2021)   PRAPARE - Hydrologist (Medical): No    Lack of Transportation (Non-Medical): No  Physical Activity: Not on file  Stress: Not on file  Social Connections: Not on file   Family History  Problem Relation Age of Onset   Asthma Mother    Arthritis Sister    Asthma Brother    Arthritis Brother    Allergies  Allergen Reactions   Sulfa Antibiotics Itching   Prior to Admission medications   Medication Sig Start Date  End Date Taking? Authorizing Provider  abemaciclib (VERZENIO) 100 MG tablet Take 1 tablet (100 mg total) by mouth 2 (two) times daily. Swallow tablets whole. Do not chew, crush, or split tablets before swallowing. 09/30/21  Yes Iruku, Arletha Pili, MD  acetaminophen (TYLENOL) 500 MG tablet Take 500 mg by mouth every 8 (eight) hours as needed for moderate pain.   Yes [provider]  anastrozole (ARIMIDEX) 1 MG tablet Take 1 tablet (1 mg total) by mouth daily. 10/31/20  Yes Magrinat, Virgie Dad, MD  aspirin 81 MG chewable tablet Chew 81 mg by mouth daily.   Yes [provider]  atorvastatin (LIPITOR) 40 MG tablet Take 1 tablet (40 mg total) by mouth daily. 08/05/21 11/03/21 Yes Ngetich, Dinah C, NP  carvedilol (COREG) 6.25 MG tablet Take 6.25 mg by mouth 2 (two) times daily with a meal.   Yes [provider]  Garlic 1610 MG CAPS Take 1,000 mg by mouth daily.   Yes [provider]  lansoprazole (PREVACID) 30 MG capsule Take 30 mg by mouth every morning. 11/30/19  Yes [provider]  Magnesium 100 MG CAPS Take 100 mg by mouth 2 (two) times daily.   Yes [provider]  meloxicam (MOBIC) 15 MG tablet Take 1 tablet (15 mg total) by mouth daily. 09/24/21  Yes Ngetich, Dinah C, NP  metFORMIN (GLUCOPHAGE) 1000 MG tablet Take 1 tablet (1,000 mg total) by mouth 2 (two) times daily with a meal. 09/24/21  Yes Ngetich, Dinah C, NP  Multiple Vitamins-Minerals (CENTRUM SILVER 50+WOMEN PO) Take 1 capsule by mouth daily.   Yes [provider]  valsartan (DIOVAN) 320 MG tablet Take 320 mg by mouth daily.   Yes [provider]  vitamin C (ASCORBIC ACID) 500 MG tablet Take 500 mg by mouth daily.   Yes [provider]  cephALEXin (KEFLEX) 500 MG capsule Take 1 capsule (500 mg total) by mouth 4 (four) times daily. To be taken after  surgery 11/13/21   Aundra Dubin, PA-C  docusate sodium (COLACE) 100 MG capsule Take 1 capsule (100 mg total) by mouth  daily as needed. 11/13/21 11/13/22  Aundra Dubin, PA-C  methocarbamol (ROBAXIN-750) 750 MG tablet Take 1 tablet (750 mg total) by mouth 2 (two) times daily as needed for muscle spasms. 11/13/21   Aundra Dubin, PA-C  ondansetron (ZOFRAN) 4 MG tablet Take 1 tablet (4 mg total) by mouth every 8 (eight) hours as needed for nausea or vomiting. 11/13/21   Aundra Dubin, PA-C  oxyCODONE-acetaminophen (PERCOCET) 5-325 MG tablet Take 1-2 tablets by mouth every 8 (eight) hours as needed. To be taken after surgery 11/13/21   Aundra Dubin, PA-C  rivaroxaban (XARELTO) 10 MG TABS tablet Take 1 tablet (10 mg total) by mouth daily. To be taken after surgery to prevent blood clots 11/13/21   Aundra Dubin, PA-C     Positive ROS: All other systems have been reviewed and were otherwise negative with the exception of those mentioned in the HPI and as above.  Physical Exam: General: Alert, no acute distress Cardiovascular: No pedal edema Respiratory: No cyanosis, no use of accessory musculature GI: abdomen soft Skin: No lesions in the area of chief complaint Neurologic: Sensation intact distally Psychiatric: Patient is competent for consent with normal mood and affect Lymphatic: no lymphedema  MUSCULOSKELETAL: exam stable  Assessment: left knee degenerative joint disease  Plan: Plan for Procedure(s): LEFT TOTAL KNEE ARTHROPLASTY  The risks benefits and alternatives were discussed with the patient including but not limited to the risks of nonoperative treatment, versus surgical intervention including infection, bleeding, nerve injury,  blood clots, cardiopulmonary complications, morbidity, mortality, among others, and they were willing to proceed.   Preoperative templating of the joint replacement has been completed, documented, and submitted to the Operating Room personnel in order to optimize intra-operative equipment management.   Eduard Roux, MD 11/18/2021 5:48 AM

## 2021-11-19 LAB — CBC
HCT: 26.7 % — ABNORMAL LOW (ref 36.0–46.0)
Hemoglobin: 8.9 g/dL — ABNORMAL LOW (ref 12.0–15.0)
MCH: 32.1 pg (ref 26.0–34.0)
MCHC: 33.3 g/dL (ref 30.0–36.0)
MCV: 96.4 fL (ref 80.0–100.0)
Platelets: 151 10*3/uL (ref 150–400)
RBC: 2.77 MIL/uL — ABNORMAL LOW (ref 3.87–5.11)
RDW: 17 % — ABNORMAL HIGH (ref 11.5–15.5)
WBC: 2.6 10*3/uL — ABNORMAL LOW (ref 4.0–10.5)
nRBC: 0 % (ref 0.0–0.2)

## 2021-11-19 LAB — GLUCOSE, CAPILLARY
Glucose-Capillary: 136 mg/dL — ABNORMAL HIGH (ref 70–99)
Glucose-Capillary: 176 mg/dL — ABNORMAL HIGH (ref 70–99)

## 2021-11-19 NOTE — Progress Notes (Signed)
Subjective: 1 Day Post-Op Procedure(s) (LRB): LEFT TOTAL KNEE ARTHROPLASTY (Left) Patient reports pain as mild.    Objective: Vital signs in last 24 hours: Temp:  [97.9 F (36.6 C)-99.3 F (37.4 C)] 98.2 F (36.8 C) (07/11 0457) Pulse Rate:  [72-108] 99 (07/11 0727) Resp:  [14-20] 19 (07/11 0457) BP: (91-165)/(64-95) 126/77 (07/11 0727) SpO2:  [92 %-99 %] 95 % (07/11 0727)  Intake/Output from previous day: 07/10 0701 - 07/11 0700 In: 3060 [P.O.:360; I.V.:2400; IV Piggyback:200] Out: 7564 [Urine:3425; Blood:30] Intake/Output this shift: No intake/output data recorded.  Recent Labs    11/19/21 0250  HGB 8.9*   Recent Labs    11/19/21 0250  WBC 2.6*  RBC 2.77*  HCT 26.7*  PLT 151   No results for input(s): "NA", "K", "CL", "CO2", "BUN", "CREATININE", "GLUCOSE", "CALCIUM" in the last 72 hours. No results for input(s): "LABPT", "INR" in the last 72 hours.  Neurologically intact Neurovascular intact Sensation intact distally Intact pulses distally Dorsiflexion/Plantar flexion intact Incision: dressing C/D/I No cellulitis present Compartment soft   Assessment/Plan: 1 Day Post-Op Procedure(s) (LRB): LEFT TOTAL KNEE ARTHROPLASTY (Left) Advance diet Up with therapy D/C IV fluids Discharge home with home health once cleared by PT (likely this afternoon after second session) WBAT LLE ABLA- mild and stable   Anticipated LOS equal to or greater than 2 midnights due to - Age 64 and older with one or more of the following:  - Obesity  - Expected need for hospital services (PT, OT, Nursing) required for safe  discharge  - Anticipated need for postoperative skilled nursing care or inpatient rehab  - Active co-morbidities: DVT/VTE OR   - Unanticipated findings during/Post Surgery: None  - Patient is a high risk of re-admission due to: None   Aundra Dubin 11/19/2021, 7:47 AM

## 2021-11-19 NOTE — Anesthesia Postprocedure Evaluation (Signed)
Anesthesia Post Note  [name]  Procedure(s) Performed: LEFT TOTAL KNEE ARTHROPLASTY (Left: Knee)     Patient location during evaluation: PACU Anesthesia Type: Spinal Level of consciousness: awake, awake and alert and oriented Pain management: pain level controlled Vital Signs Assessment: post-procedure vital signs reviewed and stable Respiratory status: spontaneous breathing, nonlabored ventilation and respiratory function stable Cardiovascular status: blood pressure returned to baseline and stable Postop Assessment: no headache, no backache, spinal receding and no apparent nausea or vomiting Anesthetic complications: no   No notable events documented.  Last Vitals:  Vitals:   11/19/21 0457 11/19/21 0727  BP: (!) 165/86 126/77  Pulse: 99 99  Resp: 19   Temp: 36.8 C 36.6 C  SpO2: 92% 95%    Last Pain:  Vitals:   11/19/21 0830  TempSrc:   PainSc: [place]

## 2021-11-19 NOTE — Discharge Summary (Signed)
Patient ID: [name] [name] MRN: [id number] DOB/AGE: 06/29/1955 66 y.o.  Admit date: 11/18/2021 Discharge date: 11/19/2021  Admission Diagnoses:  Principal Problem:   Primary osteoarthritis of left knee Active Problems:   Status post total left knee replacement   Discharge Diagnoses:  Same  Past Medical History:  Diagnosis Date   Arthritis    Breast cancer ([place])    Cancer ([place])    breast   Diabetes mellitus without complication ([place])    History of MRI    Knee, Hip, and Back.   Hypertension    Personal history of chemotherapy    Personal history of radiation therapy     Surgeries: Procedure(s): LEFT TOTAL KNEE ARTHROPLASTY on 11/18/2021   Consultants:   Discharged Condition: Improved  Hospital Course: Analeia Ismael is an 66 y.o. female who was admitted 11/18/2021 for operative treatment ofPrimary osteoarthritis of left knee. Patient has severe unremitting pain that affects sleep, daily activities, and work/hobbies. After pre-op clearance the patient was taken to the operating room on 11/18/2021 and underwent  Procedure(s): LEFT TOTAL KNEE ARTHROPLASTY.    Patient was given perioperative antibiotics:  Anti-infectives (From admission, onward)    Start     Dose/Rate Route Frequency Ordered Stop   11/18/21 1530  ceFAZolin (ANCEF) IVPB 2g/100 mL premix        2 g 200 mL/hr over 30 Minutes Intravenous Every 6 hours 11/18/21 1419 11/18/21 2133   11/18/21 0810  vancomycin (VANCOCIN) powder  Status:  Discontinued          As needed 11/18/21 0810 11/18/21 0950   11/18/21 0600  ceFAZolin (ANCEF) IVPB 2g/100 mL premix        2 g 200 mL/hr over 30 Minutes Intravenous On call to O.R. 11/18/21 0546 11/18/21 0720   11/18/21 0550  ceFAZolin (ANCEF) 2-4 GM/100ML-% IVPB       Note to Pharmacy: Ladoris Gene A: cabinet override      11/18/21 0550 11/18/21 0742        Patient was given sequential compression devices, early ambulation, and chemoprophylaxis to prevent DVT.  Patient  benefited maximally from hospital stay and there were no complications.    Recent vital signs: Patient Vitals for the past 24 hrs:  BP Temp Temp src Pulse Resp SpO2  11/19/21 0727 126/77 -- -- 99 -- 95 %  11/19/21 0457 (!) 165/86 98.2 F (36.8 C) -- 99 19 92 %  11/19/21 0004 (!) 143/85 -- -- 91 18 99 %  11/18/21 1944 128/87 99.3 F (37.4 C) -- 100 18 99 %  11/18/21 1419 (!) 154/90 99.1 F (37.3 C) Oral -- -- --  11/18/21 1300 (!) 139/95 -- -- (!) 108 17 97 %  11/18/21 1115 137/86 -- -- 72 19 97 %  11/18/21 1100 130/75 -- -- 73 14 93 %  11/18/21 1045 127/80 -- -- 83 20 95 %  11/18/21 1015 108/73 -- -- 81 19 95 %  11/18/21 1000 112/72 -- -- 75 20 95 %  11/18/21 0955 91/64 97.9 F (36.6 C) -- 75 20 95 %     Recent laboratory studies:  Recent Labs    11/19/21 0250  WBC 2.6*  HGB 8.9*  HCT 26.7*  PLT 151     Discharge Medications:   Allergies as of 11/19/2021       Reactions   Sulfa Antibiotics Itching        Medication List     STOP taking these medications    acetaminophen 500  MG tablet Commonly known as: TYLENOL   aspirin 81 MG chewable tablet   meloxicam 15 MG tablet Commonly known as: MOBIC       TAKE these medications    abemaciclib 100 MG tablet Commonly known as: VERZENIO Take 1 tablet (100 mg total) by mouth 2 (two) times daily. Swallow tablets whole. Do not chew, crush, or split tablets before swallowing.   anastrozole 1 MG tablet Commonly known as: ARIMIDEX Take 1 tablet (1 mg total) by mouth daily.   atorvastatin 40 MG tablet Commonly known as: LIPITOR Take 1 tablet (40 mg total) by mouth daily.   carvedilol 6.25 MG tablet Commonly known as: COREG Take 6.25 mg by mouth 2 (two) times daily with a meal.   CENTRUM SILVER 50+WOMEN PO Take 1 capsule by mouth daily.   cephALEXin 500 MG capsule Commonly known as: Keflex Take 1 capsule (500 mg total) by mouth 4 (four) times daily. To be taken after surgery   docusate sodium 100 MG  capsule Commonly known as: Colace Take 1 capsule (100 mg total) by mouth daily as needed.   Garlic 7672 MG Caps Take 1,000 mg by mouth daily.   lansoprazole 30 MG capsule Commonly known as: PREVACID Take 30 mg by mouth every morning.   Magnesium 100 MG Caps Take 100 mg by mouth 2 (two) times daily.   metFORMIN 1000 MG tablet Commonly known as: GLUCOPHAGE Take 1 tablet (1,000 mg total) by mouth 2 (two) times daily with a meal.   methocarbamol 750 MG tablet Commonly known as: Robaxin-750 Take 1 tablet (750 mg total) by mouth 2 (two) times daily as needed for muscle spasms.   ondansetron 4 MG tablet Commonly known as: Zofran Take 1 tablet (4 mg total) by mouth every 8 (eight) hours as needed for nausea or vomiting.   oxyCODONE-acetaminophen 5-325 MG tablet Commonly known as: Percocet Take 1-2 tablets by mouth every 8 (eight) hours as needed. To be taken after surgery   rivaroxaban 10 MG Tabs tablet Commonly known as: XARELTO Take 1 tablet (10 mg total) by mouth daily. To be taken after surgery to prevent blood clots   valsartan 320 MG tablet Commonly known as: DIOVAN Take 320 mg by mouth daily.   vitamin C 500 MG tablet Commonly known as: ASCORBIC ACID Take 500 mg by mouth daily.               Durable Medical Equipment  (From admission, onward)           Start     Ordered   11/18/21 1420  DME Walker rolling  Once       Question Answer Comment  Walker: With 5 Inch Wheels   Patient needs a walker to treat with the following condition Status post left partial knee replacement      11/18/21 1419   11/18/21 1420  DME 3 n 1  Once        11/18/21 1419   11/18/21 1420  DME Bedside commode  Once       Question:  Patient needs a bedside commode to treat with the following condition  Answer:  Status post left partial knee replacement   11/18/21 1419            Diagnostic Studies: DG Knee Left Port  Result Date: 11/18/2021 CLINICAL DATA:  Status post  left knee replacement. EXAM: PORTABLE LEFT KNEE - 1-2 VIEW COMPARISON:  Left knee radiographs 06/27/2021 FINDINGS: Interval total left knee arthroplasty.  No perihardware lucency is seen to indicate hardware failure or loosening. Expected postoperative changes including moderate joint effusion and both subcutaneous and intra-articular air. No acute fracture or dislocation. IMPRESSION: Interval total left knee arthroplasty without evidence of hardware failure. Electronically Signed   By: Yvonne Kendall M.D.   On: 11/18/2021 10:40   DG MOBILE BONE DENSITY  Result Date: 11/08/2021 EXAM: DUAL X-RAY ABSORPTIOMETRY (DXA) FOR BONE MINERAL DENSITY 11/07/2021 3:56 pm CLINICAL DATA:  66 year old Female Postmenopausal. post menopausal breast cancer on aromatase inhibitor Patient is or has been on hormonal therapy for breast cancer. TECHNIQUE: An axial (e.g., hips, spine) and/or appendicular (e.g., radius) exam was performed, as appropriate, using Clinical research associate at [employer]. Images are obtained for bone mineral density measurement and are not obtained for diagnostic purposes. [id number] Exclusions: None. COMPARISON:  None. FINDINGS: Scan quality: Good. LUMBAR SPINE (L1-L4): BMD (in g/cm2): 0.878 T-score: -2.5 Z-score: -0.6 LEFT FEMORAL NECK: BMD (in g/cm2): 0.554 T-score: -2.8 Z-score: -1.6 LEFT TOTAL HIP: BMD (in g/cm2): 0.630 T-score: -2.6 Z-score: -1.7 LEFT FOREARM (RADIUS 33%): BMD (in g/cm2): 0.588 T-score: -1.8 Z-score: -0.2 FRAX 10-YEAR PROBABILITY OF FRACTURE: Patient does not meet criteria for FRAX assessment due to osteoporosis. IMPRESSION: Osteoporosis based on BMD. World Health Organization T-score criteria for osteoporosis Normal = T-score at or above -1.0 SD. Low Bone Mass (Osteopenia) = T-score between -1.0 and -2.5 SD. Osteoporosis = T-score at or below -2.5 SD. Fracture risk is increased. Increased risk is based on low BMD. RECOMMENDATIONS: 1. All patients should optimize calcium  and vitamin D intake. 2. Consider FDA-approved medical therapies in postmenopausal women and men aged 40 years and older, based on the following: - A hip or vertebral (clinical or morphometric) fracture - T-score less than or equal to -2.5 and secondary causes have been excluded. - Low bone mass (T-score between -1.0 and -2.5) and a 10-year probability of a hip fracture greater than or equal to 3% or a 10-year probability of a major osteoporosis-related fracture greater than or equal to 20% based on the [place]-adapted WHO algorithm. - Clinician judgment and/or patient preferences may indicate treatment for people with 10-year fracture probabilities above or below these levels 3. Patients with diagnosis of osteoporosis or at high risk for fracture should have regular bone mineral density tests. For patients eligible for Medicare, routine testing is allowed once every 2 years. The testing frequency can be increased to one year for patients who have rapidly progressing disease, those who are receiving or discontinuing medical therapy to restore bone mass, or have additional risk factors. Electronically Signed   By: Franki Cabot M.D.   On: 11/08/2021 08:44    Disposition: Discharge disposition: 01-Home or Self Care          Follow-up Information     Leandrew Koyanagi, MD. Schedule an appointment as soon as possible for a visit in 2 week(s).   Specialty: Orthopedic Surgery Contact information: [place] [address] [phone]         Ngetich, Nelda Bucks, NP Follow up.   Specialty: Family Medicine Contact information: [place] [address] [phone]                  Signed: Aundra Dubin 11/19/2021, 7:49 AM

## 2021-11-19 NOTE — Plan of Care (Signed)

## 2021-11-19 NOTE — Progress Notes (Signed)
Physical Therapy Treatment Patient Details Name: [name] [name] MRN: [id number] DOB: 12/16/1955 Today's Date: 11/19/2021   History of Present Illness Pt is a 66 y/o female s/p L TKA. PMH includes breast cancer.    PT Comments    Pt progressing steadily towards her physical therapy goals. Session focused on reviewing HEP for left knee strengthening and ROM (written handout provided) and gait training. Pt ambulating 100 ft with a walker, utilizing a step through pattern. Anticipate will be able to safely d/c home after 2nd afternoon session.     Recommendations for follow up therapy are one component of a multi-disciplinary discharge planning process, led by the attending physician.  Recommendations may be updated based on patient status, additional functional criteria and insurance authorization.  Follow Up Recommendations  Follow physician's recommendations for discharge plan and follow up therapies     Assistance Recommended at Discharge Intermittent Supervision/Assistance  Patient can return home with the following Help with stairs or ramp for entrance;Assist for transportation;Assistance with cooking/housework   Equipment Recommendations  BSC/3in1    Recommendations for Other Services       Precautions / Restrictions Precautions Precautions: Knee Precaution Booklet Issued: No Precaution Comments: Reviewed knee precautions with pt Restrictions Weight Bearing Restrictions: Yes LLE Weight Bearing: Weight bearing as tolerated     Mobility  Bed Mobility Overal bed mobility: Modified Independent             General bed mobility comments: Cues for initiation, exiting towards left side of bed, no physical assist requierd    Transfers Overall transfer level: Needs assistance Equipment used: Rolling walker (2 wheels) Transfers: Sit to/from Stand Sit to Stand: Min guard           General transfer comment: Min guard for safety, cues for hand placement, no physical  assist required    Ambulation/Gait Ambulation/Gait assistance: Min guard Gait Distance (Feet): 100 Feet Assistive device: Rolling walker (2 wheels) Gait Pattern/deviations: Step-through pattern, Decreased step length - right, Decreased dorsiflexion - left, Decreased weight shift to left, Decreased stance time - left Gait velocity: decreased     General Gait Details: Cues for left heel strike, decreased circumduction, decreased L step length and upward gaze   Stairs             Wheelchair Mobility    Modified Rankin (Stroke Patients Only)       Balance Overall balance assessment: Needs assistance Sitting-balance support: No upper extremity supported, Feet supported Sitting balance-Leahy Scale: Good     Standing balance support: Bilateral upper extremity supported Standing balance-Leahy Scale: Poor Standing balance comment: Reliant on BUE support                            Cognition Arousal/Alertness: Awake/alert Behavior During Therapy: WFL for tasks assessed/performed Overall Cognitive Status: Within Functional Limits for tasks assessed                                          Exercises Total Joint Exercises Ankle Circles/Pumps: AROM, Both, 20 reps Quad Sets: Left, 10 reps, Supine Heel Slides: Left, Supine, AROM, 20 reps, Seated Hip ABduction/ADduction: Left, AROM, 10 reps, Supine Long Arc Quad: AROM, Left, 10 reps, Seated Goniometric ROM: 10-85 degrees    General Comments        Pertinent Vitals/Pain Pain Assessment Pain Assessment: Faces Faces  Pain Scale: Hurts little more Pain Location: L knee Pain Descriptors / Indicators: Grimacing, Guarding Pain Intervention(s): Monitored during session, Patient requesting pain meds-RN notified, Ice applied    Home Living Family/patient expects to be discharged to:: Private residence Living Arrangements: Children Available Help at Discharge: Family Type of Home: House Home  Access: Stairs to enter Entrance Stairs-Rails: None Entrance Stairs-Number of Steps: 1   Home Layout: One level Home Equipment: Conservation officer, nature (2 wheels);Cane - single point      Prior Function            PT Goals (current goals can now be found in the care plan section) Acute Rehab PT Goals Patient Stated Goal: to decrease pain PT Goal Formulation: With patient Time For Goal Achievement: 12/02/21 Potential to Achieve Goals: Good Progress towards PT goals: Progressing toward goals    Frequency    7X/week      PT Plan Current plan remains appropriate    Co-evaluation              AM-PAC PT "6 Clicks" Mobility   Outcome Measure  Help needed turning from your back to your side while in a flat bed without using bedrails?: None Help needed moving from lying on your back to sitting on the side of a flat bed without using bedrails?: None Help needed moving to and from a bed to a chair (including a wheelchair)?: A Little Help needed standing up from a chair using your arms (e.g., wheelchair or bedside chair)?: A Little Help needed to walk in hospital room?: A Little Help needed climbing 3-5 steps with a railing? : A Little 6 Click Score: 20    End of Session Equipment Utilized During Treatment: Gait belt Activity Tolerance: Patient tolerated treatment well Patient left: with call bell/phone within reach;in chair Nurse Communication: Mobility status PT Visit Diagnosis: Other abnormalities of gait and mobility (R26.89);Difficulty in walking, not elsewhere classified (R26.2);Pain Pain - Right/Left: Left Pain - part of body: Knee     Time: 0812-0842 PT Time Calculation (min) (ACUTE ONLY): 30 min  Charges:  $Gait Training: 8-22 mins $Therapeutic Exercise: 8-22 mins                     Wyona Almas, PT, DPT Acute Rehabilitation Services Office [phone]    Deno Etienne 11/19/2021, 8:54 AM

## 2021-11-19 NOTE — Evaluation (Signed)
Occupational Therapy Evaluation/Discharge Patient Details Name: [name] [name] MRN: [id number] DOB: [birth year] Today's Date: 11/19/2021   History of Present Illness Pt is a 66 y/o female s/p L TKA. PMH includes breast cancer.   Clinical Impression   PTA, pt lives with daughter (who works from home) and typically [place] with ADLs, household IADLs, and mobility using cane. Pt presents now with moderate L knee pain but mobilizing well. Pt Modified Independent for UB ADLs, Setup for LB ADLs and Supervision for mobility using RW. Educated re: techniques for LB ADLs, safety in tub/shower transfers and encouraged pt to have daughter present for initial showering task attempts. Pt verbalized understanding of all education with no further skilled OT services needed at acute level or DC.      Recommendations for follow up therapy are one component of a multi-disciplinary discharge planning process, led by the attending physician.  Recommendations may be updated based on patient status, additional functional criteria and insurance authorization.   Follow Up Recommendations  No OT follow up    Assistance Recommended at Discharge PRN  Patient can return home with the following A little help with bathing/dressing/bathroom;Assist for transportation;Help with stairs or ramp for entrance;Assistance with cooking/housework    Functional Status Assessment  Patient has had a recent decline in their functional status and demonstrates the ability to make significant improvements in function in a reasonable and predictable amount of time.  Equipment Recommendations  BSC/3in1    Recommendations for Other Services       Precautions / Restrictions Precautions Precautions: Knee Precaution Booklet Issued: No Precaution Comments: Reviewed knee precautions with pt Restrictions Weight Bearing Restrictions: Yes LLE Weight Bearing: Weight bearing as tolerated      Mobility Bed Mobility Overal bed  mobility: Modified Independent Bed Mobility: Sit to Supine       Sit to supine: Modified independent (Device/Increase time)        Transfers Overall transfer level: Modified independent Equipment used: Rolling walker (2 wheels), None Transfers: Sit to/from Stand Sit to Stand: Modified independent (Device/Increase time)           General transfer comment: able to stand without AD initially, cued for RW use for safety with pt able to stand from toilet easily      Balance Overall balance assessment: Needs assistance Sitting-balance support: No upper extremity supported, Feet supported Sitting balance-Leahy Scale: Good     Standing balance support: Bilateral upper extremity supported Standing balance-Leahy Scale: Fair Standing balance comment: can statically stand without UE support at sink                           ADL either performed or assessed with clinical judgement   ADL Overall ADL's : Needs assistance/impaired Eating/Feeding: Independent   Grooming: Modified independent;Standing;Wash/dry hands Grooming Details (indicate cue type and reason): s/p toileting task Upper Body Bathing: Sitting;Modified independent   Lower Body Bathing: Set up;Sit to/from stand   Upper Body Dressing : Modified independent   Lower Body Dressing: Set up Lower Body Dressing Details (indicate cue type and reason): able to don underwear/pants easily. did don affected LE second with underwear - educated to trial with L LE first when donning shorts with pt able to return demo easily Toilet Transfer: Supervision/safety;Ambulation;Rolling walker (2 wheels) Toilet Transfer Details (indicate cue type and reason): assist for line mgmt d/t decreased awareness of IV line. able to mobilize to/from bathroom easily and slowly descend to toilet while kicking  L LE out. use of grab bars Toileting- Clothing Manipulation and Hygiene: Modified independent;Sit to/from stand;Sitting/lateral  lean Toileting - Clothing Manipulation Details (indicate cue type and reason): able to manage clothing, hygiene easily     Functional mobility during ADLs: Supervision/safety;Rolling walker (2 wheels) General ADL Comments: Able to reach L foot for LB ADLs and mobilize well with RW for support to offload painful L LE. Verbally discussed tub transfer strategies and use of BSC as shower chair. Encouraged pt to have daughter present for initial showering tasks or to complete sponge  bathing if not confident in stepping over tub     Vision Ability to See in Adequate Light: 0 Adequate Patient Visual Report: No change from baseline Vision Assessment?: No apparent visual deficits     Perception     Praxis      Pertinent Vitals/Pain Pain Assessment Pain Assessment: 0-10 Pain Score: 6  Pain Location: L knee Pain Descriptors / Indicators: Grimacing, Guarding Pain Intervention(s): Monitored during session     Hand Dominance Right   Extremity/Trunk Assessment Upper Extremity Assessment Upper Extremity Assessment: Overall WFL for tasks assessed   Lower Extremity Assessment Lower Extremity Assessment: Defer to PT evaluation   Cervical / Trunk Assessment Cervical / Trunk Assessment: Normal   Communication Communication Communication: No difficulties   Cognition Arousal/Alertness: Awake/alert Behavior During Therapy: WFL for tasks assessed/performed Overall Cognitive Status: Within Functional Limits for tasks assessed                                       General Comments       Exercises     Shoulder Instructions      Home Living Family/patient expects to be discharged to:: Private residence Living Arrangements: Children Available Help at Discharge: Family Type of Home: House Home Access: Stairs to enter Technical brewer of Steps: 1 Entrance Stairs-Rails: None Home Layout: One level     Bathroom Shower/Tub: Teacher, early years/pre:  Standard     Home Equipment: Conservation officer, nature (2 wheels);Cane - single point          Prior Functioning/Environment Prior Level of Function : Independent/Modified Independent             Mobility Comments: Uses cane for ambulation ADLs Comments: Mod I for ADLs, some iADLs in the home        OT Problem List: Decreased activity tolerance;Impaired balance (sitting and/or standing);Pain;Decreased knowledge of use of DME or AE      OT Treatment/Interventions:      OT Goals(Current goals can be found in the care plan section) Acute Rehab OT Goals Patient Stated Goal: pain control OT Goal Formulation: All assessment and education complete, DC therapy  OT Frequency:      Co-evaluation              AM-PAC OT "6 Clicks" Daily Activity     Outcome Measure Help from another person eating meals?: None Help from another person taking care of personal grooming?: None Help from another person toileting, which includes using toliet, bedpan, or urinal?: None Help from another person bathing (including washing, rinsing, drying)?: A Little Help from another person to put on and taking off regular upper body clothing?: None Help from another person to put on and taking off regular lower body clothing?: A Little 6 Click Score: 22   End of Session Equipment Utilized During Treatment:  Rolling walker (2 wheels) Nurse Communication: Mobility status (discussed with NT pt inquiring about breakfast tray)  Activity Tolerance: Patient tolerated treatment well Patient left: in bed;with call bell/phone within reach;with bed alarm set  OT Visit Diagnosis: Other abnormalities of gait and mobility (R26.89);Pain Pain - Right/Left: Left Pain - part of body: Knee                Time: 0349-1791 OT Time Calculation (min): 21 min Charges:  OT General Charges $OT Visit: 1 Visit OT Evaluation $OT Eval Low Complexity: 1 Low  Malachy Chamber, OTR/L Acute Rehab Services Office: [phone]   Layla Maw 11/19/2021, 9:15 AM

## 2021-11-20 ENCOUNTER — Encounter: Payer: Self-pay | Admitting: Oncology

## 2021-11-20 ENCOUNTER — Ambulatory Visit: Payer: Self-pay

## 2021-11-20 ENCOUNTER — Other Ambulatory Visit (HOSPITAL_COMMUNITY): Payer: Self-pay

## 2021-11-20 MED ORDER — XARELTO 10 MG PO TABS
10.0000 mg | ORAL_TABLET | Freq: Every day | ORAL | 0 refills | Status: DC
Start: 2021-11-20 — End: 2022-01-23
  Filled 2021-11-20 (×2): qty 30, 30d supply, fill #0

## 2021-11-20 NOTE — Patient Outreach (Signed)
  Care Coordination [facility] Note Transition Care Management Follow-up Telephone Call Date of discharge and from where: 11/19/21 [facility] How have you been since you were released from the hospital? Doing well Any questions or concerns? Yes, spoke with patient and her daughter [name] who reports patients Xarelto prescription is at [facility] and too expensive to fill. [name] indicate [facility] advised her to call [facility] since the patient is covered under a discount program.  Items Reviewed: Did the pt receive and understand the discharge instructions provided? Yes  Medications obtained and verified? Yes  Other? No  Any new allergies since your discharge? No  Dietary orders reviewed? No Do you have support at home? Yes   Home Care and Equipment/Supplies: Were home health services ordered? no If so, what is the name of the agency?   Has the agency set up a time to come to the patient's home? not applicable Were any new equipment or medical supplies ordered?  No What is the name of the medical supply agency?  Were you able to get the supplies/equipment? not applicable Do you have any questions related to the use of the equipment or supplies? No  Functional Questionnaire: (I = Independent and D = Dependent) ADLs: II  Bathing/Dressing- I  Meal Prep- I  Eating- I  Maintaining continence- I  Transferring/Ambulation- I  Managing Meds- I  Follow up appointments reviewed:  PCP Hospital f/u appt confirmed?  No, patient scheduled 02/06/22. Advised to call for a sooner appointment if needed.   [facility] f/u appt confirmed? Yes  Scheduled to see Dr. Erlinda Hong on 12/03/21 @ 1:00. Are transportation arrangements needed? No  If their condition worsens, is the pt aware to call PCP or go to the Emergency Dept.? Yes Was the patient provided with contact information for the PCP's office or ED? Yes Was to pt encouraged to call back with questions or concerns? Yes  SDOH assessments and interventions  completed:   Yes  Care Coordination Interventions Activated:  Yes Care Coordination Interventions:   Provided contact information to the [facility] to get Xarelto prescription transferred for cost savings. Currently at [facility] over $600 to fill. [name] will call [employer] to request prescription be transferred to the [facility] outpatient pharmacy.   Encounter Outcome:  Pt. Visit Completed  Daneen Schick, BSW, CDP Social Worker, Certified Dementia Practitioner [facility] [phone]

## 2021-11-26 ENCOUNTER — Inpatient Hospital Stay: Payer: No Typology Code available for payment source | Attending: Hematology and Oncology

## 2021-11-26 ENCOUNTER — Other Ambulatory Visit: Payer: Self-pay

## 2021-11-26 ENCOUNTER — Inpatient Hospital Stay: Payer: No Typology Code available for payment source | Admitting: Pharmacist

## 2021-11-26 VITALS — BP 133/95 | HR 95 | Temp 97.7°F | Resp 18 | Ht 66.0 in | Wt 207.2 lb

## 2021-11-26 DIAGNOSIS — Z170 Estrogen receptor positive status [ER+]: Secondary | ICD-10-CM

## 2021-11-26 DIAGNOSIS — Z9221 Personal history of antineoplastic chemotherapy: Secondary | ICD-10-CM | POA: Insufficient documentation

## 2021-11-26 DIAGNOSIS — C50411 Malignant neoplasm of upper-outer quadrant of right female breast: Secondary | ICD-10-CM | POA: Insufficient documentation

## 2021-11-26 DIAGNOSIS — Z79811 Long term (current) use of aromatase inhibitors: Secondary | ICD-10-CM | POA: Insufficient documentation

## 2021-11-26 DIAGNOSIS — Z923 Personal history of irradiation: Secondary | ICD-10-CM | POA: Insufficient documentation

## 2021-11-26 LAB — CBC WITH DIFFERENTIAL (CANCER CENTER ONLY)
Abs Immature Granulocytes: 0.02 10*3/uL (ref 0.00–0.07)
Basophils Absolute: 0.1 10*3/uL (ref 0.0–0.1)
Basophils Relative: 1 %
Eosinophils Absolute: 0.1 10*3/uL (ref 0.0–0.5)
Eosinophils Relative: 2 %
HCT: 27.2 % — ABNORMAL LOW (ref 36.0–46.0)
Hemoglobin: 9.3 g/dL — ABNORMAL LOW (ref 12.0–15.0)
Immature Granulocytes: 0 %
Lymphocytes Relative: 31 %
Lymphs Abs: 1.5 10*3/uL (ref 0.7–4.0)
MCH: 33 pg (ref 26.0–34.0)
MCHC: 34.2 g/dL (ref 30.0–36.0)
MCV: 96.5 fL (ref 80.0–100.0)
Monocytes Absolute: 0.4 10*3/uL (ref 0.1–1.0)
Monocytes Relative: 7 %
Neutro Abs: 3 10*3/uL (ref 1.7–7.7)
Neutrophils Relative %: 59 %
Platelet Count: 358 10*3/uL (ref 150–400)
RBC: 2.82 MIL/uL — ABNORMAL LOW (ref 3.87–5.11)
RDW: 16.6 % — ABNORMAL HIGH (ref 11.5–15.5)
WBC Count: 5 10*3/uL (ref 4.0–10.5)
nRBC: 0 % (ref 0.0–0.2)

## 2021-11-26 LAB — CMP (CANCER CENTER ONLY)
ALT: 29 U/L (ref 0–44)
AST: 26 U/L (ref 15–41)
Albumin: 4.2 g/dL (ref 3.5–5.0)
Alkaline Phosphatase: 88 U/L (ref 38–126)
Anion gap: 8 (ref 5–15)
BUN: 8 mg/dL (ref 8–23)
CO2: 27 mmol/L (ref 22–32)
Calcium: 9.8 mg/dL (ref 8.9–10.3)
Chloride: 94 mmol/L — ABNORMAL LOW (ref 98–111)
Creatinine: 0.79 mg/dL (ref 0.44–1.00)
GFR, Estimated: >60 mL/min (ref >60)
Glucose, Bld: 128 mg/dL — ABNORMAL HIGH (ref 70–99)
Potassium: 4.6 mmol/L (ref 3.5–5.1)
Sodium: 129 mmol/L — ABNORMAL LOW (ref 135–145)
Total Bilirubin: 0.7 mg/dL (ref 0.3–1.2)
Total Protein: 7.2 g/dL (ref 6.5–8.1)

## 2021-11-26 NOTE — Progress Notes (Signed)
[place]       Telephone: [phone]?Fax: [phone]   Oncology Clinical Pharmacist Practitioner Progress Note  [name] [name] was contacted via in person to discuss her chemotherapy regimen for abemaciclib which they receive under the care of [name] [name].   Current treatment regimen and start date Abemaciclib (08/15/21) Anastrozole (10/31/20)   Interval History She continues on abemaciclib 100 mg by mouth every 12 hours on days 1 to 28 of a 28-day cycle. This is being given in combination with anastrozole. Therapy is planned to continue until 2 years in the adjuvant setting per the [facility] trial data.   Response to Therapy [name] was seen today by clinical pharmacy as a follow-up to her abemaciclib management.  She was last seen by [name] [name] on 10/30/21 and clinical pharmacy on 10/15/21.  She recently had a left total knee replacement done at [facility].  She states that she is recovering well and that the pain medicine that has been prescribed is working.  She also states that she is continuing physical therapy.  Overall, she states that she is tolerating the abemaciclib well.  She has used ondansetron from time to time for mild nausea but has not needed any loperamide for diarrhea.  When she saw [name] [name] last, they discussed potentially going up on the dose of abemaciclib today.  [name] [name] is ready to go up on the dose and we have sent a new prescription for abemaciclib 150 mg every 12 hours by mouth to [facility].  [name] [name] states that she has not received her abemaciclib yet and she is down to about the last 2 days or so.  We did contact [facility] while [name] [name] was in the office and spoke to a pharmacist [name] F.  She was kind enough to take a verbal order and will expedite the abemaciclib out.  We also did put [name] [name] on auto refill after speaking to [name] [name] and getting her approval.  This should help minimize the risk of any  further delays.  Her hemoglobin is improved today up to 9.3 g/dL and her platelets have also increased to 358 K.  Her blood pressure continues to be monitored by [name] [name] and she has a follow-up appointment scheduled with her on 02/06/22.  Her sodium today is estimated at 129 mmol/L.  [name] [name] states that she has been drinking a lot of water at home and she was on fluids when she was admitted for her surgery.  She does not report any signs of hyponatremia at this time.  We recommended her to drink electrolyte containing water such as Pedialyte for now and is drinking more water may cause further dilution of her sodium.  She does have a follow-up with [name] [name] scheduled for 12/25/21 and we did review that she should contact us should she start having symptoms such as worsening nausea, fatigue, headache, confusion or weakness.  She verbalized understanding of the plan.  Clinical pharmacy will plan on seeing her again on 01/22/22 and [name] [name] knows that she can contact the clinic with any questions or concerns between now and her follow-up appointment with [name] [name]. Labs, vitals, treatment parameters, and manufacturer guidelines assessing toxicity were reviewed with [name] [name] today. Based on these values, patient is in agreement to continue abemaciclib therapy at this time.  Allergies Allergies  Allergen Reactions   Sulfa Antibiotics Itching    Vitals    11/26/2021  3:27 PM 11/19/2021    7:27 AM 11/19/2021    4:57 AM  Vitals with BMI  Height '5\' 6"'$     Weight 207 lbs 3 oz    BMI 70.17    Systolic 793 903 009  Diastolic 95 77 86  Pulse 95 99 99     Laboratory Data    Latest Ref Rng & Units 11/26/2021    2:38 PM 11/19/2021    2:50 AM 11/07/2021    3:00 PM  CBC EXTENDED  WBC 4.0 - 10.5 K/uL 5.0  2.6  3.7   RBC 3.87 - 5.11 MIL/uL 2.82  2.77  3.40   Hemoglobin 12.0 - 15.0 g/dL 9.3  8.9  10.7   HCT 36.0 - 46.0 % 27.2  26.7  33.0   Platelets 150 - 400 K/uL 358  151  200   NEUT# 1.7 - 7.7  K/uL 3.0     Lymph# 0.7 - 4.0 K/uL 1.5          Latest Ref Rng & Units 11/26/2021    2:38 PM 11/07/2021    3:00 PM 10/30/2021    2:50 PM  CMP  Glucose 70 - 99 mg/dL 128  105  128   BUN 8 - 23 mg/dL '8  9  9   '$ Creatinine 0.44 - 1.00 mg/dL 0.79  0.89  0.85   Sodium 135 - 145 mmol/L 129  136  134   Potassium 3.5 - 5.1 mmol/L 4.6  4.8  4.4   Chloride 98 - 111 mmol/L 94  103  101   CO2 22 - 32 mmol/L '27  27  25   '$ Calcium 8.9 - 10.3 mg/dL 9.8  9.2  9.0   Total Protein 6.5 - 8.1 g/dL 7.2  6.3  7.0   Total Bilirubin 0.3 - 1.2 mg/dL 0.7  0.6  0.5   Alkaline Phos 38 - 126 U/L 88  84  76   AST 15 - 41 U/L '26  21  24   '$ ALT 0 - 44 U/L '29  20  26     '$ No results found for: "MG"  Adverse Effects Assessment Low sodium: Estimated at 129 mmol/L.  As above, she just recently was discharged from the hospital from a left total knee replacement.  She does report drinking a lot of water since she has been home so her level may be dilutional since he also had fluids while admitted.  We recommended to monitor for symptoms closely as above, and to limit water for now.  We encouraged her to drink electrolyte containing waters such as Pedialyte.  She verbalized understanding of the plan and knows to contact the clinic immediately should she have any symptoms. Nausea: Continues to use ondansetron as needed.  Adherence Assessment [name] [name] reports missing 0 doses over the past 4 weeks.   Reason for missed dose: N/A Patient was re-educated on importance of adherence.   Access Assessment [name] [name] is currently receiving her abemaciclib through [employer] concerns: None  Medication Reconciliation The patient's medication list was reviewed today with the patient?  Yes New medications or herbal supplements have recently been started?  Yes, [name] [name] states that she uses sea moss in her tea occasionally.  We did add this to than unable to find medication.  We also added loperamide which  she is not taking currently but has on hand should she start having any diarrhea from the abemaciclib. Any medications have been discontinued?  Yes, confirmed patient is no longer on atorvastatin.  This has been removed from her med list.  We also removed a duplicate order for rivaroxaban as patient confirmed that she did not pick up the rivaroxaban prescription from [facility] but instead picked it up from [facility] The medication list was updated and reconciled based on the patient's most recent medication list in the electronic medical record (EMR) including herbal products and OTC medications.   Medications Current Outpatient Medications  Medication Sig Dispense Refill   abemaciclib (VERZENIO) 150 MG tablet Take 150 mg by mouth 2 (two) times daily.     anastrozole (ARIMIDEX) 1 MG tablet Take 1 tablet (1 mg total) by mouth daily. 90 tablet 4   carvedilol (COREG) 6.25 MG tablet Take 6.25 mg by mouth 2 (two) times daily with a meal.     cephALEXin (KEFLEX) 500 MG capsule Take 1 capsule (500 mg total) by mouth 4 (four) times daily. To be taken after surgery 40 capsule 0   docusate sodium (COLACE) 100 MG capsule Take 1 capsule (100 mg total) by mouth daily as needed. 30 capsule 2   Garlic 5573 MG CAPS Take 1,000 mg by mouth daily.     lansoprazole (PREVACID) 30 MG capsule Take 30 mg by mouth every morning.     loperamide (IMODIUM) 2 MG capsule Take 2 mg by mouth as needed for diarrhea or loose stools.     Magnesium 100 MG CAPS Take 100 mg by mouth 2 (two) times daily.     metFORMIN (GLUCOPHAGE) 1000 MG tablet Take 1 tablet (1,000 mg total) by mouth 2 (two) times daily with a meal. 180 tablet 1   methocarbamol (ROBAXIN-750) 750 MG tablet Take 1 tablet (750 mg total) by mouth 2 (two) times daily as needed for muscle spasms. 20 tablet 2   Multiple Vitamins-Minerals (CENTRUM SILVER 50+WOMEN PO) Take 1 capsule by mouth daily.     ondansetron (ZOFRAN) 4 MG tablet Take 1 tablet (4 mg total) by  mouth every 8 (eight) hours as needed for nausea or vomiting. 40 tablet 0   oxyCODONE-acetaminophen (PERCOCET) 5-325 MG tablet Take 1-2 tablets by mouth every 8 (eight) hours as needed. To be taken after surgery 40 tablet 0   rivaroxaban (XARELTO) 10 MG TABS tablet Take 1 tablet (10 mg total) by mouth daily. Take as directed after surgery. 30 tablet 0   UNABLE TO FIND Med Name: [place]. Pt reports taking 1 scoopful in tea or water     valsartan (DIOVAN) 320 MG tablet Take 320 mg by mouth daily.     vitamin C (ASCORBIC ACID) 500 MG tablet Take 500 mg by mouth daily.     No current facility-administered medications for this visit.    Drug-Drug Interactions (DDIs) DDIs were evaluated?  Yes Significant DDIs?  No The patient was instructed to speak with their health care provider and/or the oral chemotherapy pharmacist before starting any new drug, including prescription or over the counter, natural / herbal products, or vitamins.  Supportive Care Diarrhea: we reviewed that diarrhea is common with abemaciclib and confirmed that she does have loperamide (Imodium) at home.  We reviewed how to take this medication PRN Neutropenia: we discussed the importance of having a thermometer and what the Centers for Disease Control and Prevention (CDC) considers a fever which is 100.48F (38C) or higher.  Gave patient 24/7 triage line to call if any fevers or symptoms ILD/Pneumonitis: we reviewed potential symptoms including cough, shortness, and  fatigue.  Hepatotoxicity: WNL VTE: reviewed signs of DVT such as leg swelling, redness, pain, or tenderness and signs of PE such as shortness of breath, rapid or irregular heartbeat, cough, chest pain, or lightheadedness Reviewed to take the medication every 12 hours (with food sometimes can be easier on the stomach) and to take it at the same time every day.   Dosing Assessment Hepatic adjustments needed?  No Renal adjustments needed?  No Toxicity adjustments  needed?  No The current dosing regimen is appropriate to continue at this time.  Follow-Up Plan Ms. Collison will increase abemaciclib to 150 mg every 12 hours by mouth.  A new prescription has been sent to [facility] and they are expediting shipment She will limit her water intake for now due to low sodium levels.  She is asymptomatic and will start drinking electrolyte containing water such as Pedialyte.  We reviewed symptoms to watch out for and she will call us immediately should any of those symptoms arise. Continue anastrozole 1 mg by mouth daily Monitor for increased side effects due to abemaciclib since Ms. Laham will be increasing the dose. Continue to follow with [name]. Kelby [name] for blood pressure management Labs, [name] [name] visit scheduled for 12/25/21 Labs in pharmacy clinic visit on 01/22/22  [name] [name] participated in the discussion, expressed understanding, and voiced agreement with the above plan. All questions were answered to her satisfaction. The patient was advised to contact the clinic at [phone] with any questions or concerns prior to her return visit.   I spent 30 minutes assessing and educating the patient.  Raina Mina, RPH-CPP, 11/26/2021  4:20 PM   **Disclaimer: This note was dictated with voice recognition software. Similar sounding words can inadvertently be transcribed and this note may contain transcription errors which may not have been corrected upon publication of note.**

## 2021-11-29 ENCOUNTER — Telehealth: Payer: Self-pay

## 2021-11-29 ENCOUNTER — Telehealth: Payer: Self-pay | Admitting: *Deleted

## 2021-11-29 NOTE — Telephone Encounter (Signed)
This RN spoke with pt per provider wanting to check on sodium level with mild decrease.  Patient states above was discussed with the oral chemo doctor and she has included gatorade in her hydration.  Pt has no other concerns - except reviewed need for her to locate a dentist due to she does not meet the criteria for the [facility] per noted referral.  Pt verbalized understanding.

## 2021-11-29 NOTE — Telephone Encounter (Signed)
-----   Message from Gardenia Phlegm, NP sent at 11/26/2021  4:16 PM EDT ----- Patient sodium is slightly decreased.  Please call and check on her.  She may need repeat lab in 1 week. ----- Message ----- From: Interface, Lab In [place] Sent: 11/26/2021   2:50 PM EDT To: Benay Pike, MD

## 2021-11-29 NOTE — Telephone Encounter (Signed)
-----   Message from Gardenia Phlegm, NP sent at 11/26/2021  4:16 PM EDT ----- Patient sodium is slightly decreased.  Please call and check on her.  She may need repeat lab in 1 week. ----- Message ----- From: Interface, Lab In [place] Sent: 11/26/2021   2:50 PM EDT To: Benay Pike, MD

## 2021-12-03 ENCOUNTER — Encounter: Payer: Self-pay | Admitting: Oncology

## 2021-12-03 ENCOUNTER — Encounter: Payer: Self-pay | Admitting: Orthopaedic Surgery

## 2021-12-03 ENCOUNTER — Ambulatory Visit (INDEPENDENT_AMBULATORY_CARE_PROVIDER_SITE_OTHER): Payer: Self-pay

## 2021-12-03 ENCOUNTER — Ambulatory Visit (INDEPENDENT_AMBULATORY_CARE_PROVIDER_SITE_OTHER): Payer: Self-pay | Admitting: Physician Assistant

## 2021-12-03 ENCOUNTER — Other Ambulatory Visit (HOSPITAL_COMMUNITY): Payer: Self-pay

## 2021-12-03 DIAGNOSIS — Z96652 Presence of left artificial knee joint: Secondary | ICD-10-CM

## 2021-12-03 DIAGNOSIS — M1712 Unilateral primary osteoarthritis, left knee: Secondary | ICD-10-CM

## 2021-12-03 MED ORDER — METHOCARBAMOL 500 MG PO TABS
500.0000 mg | ORAL_TABLET | Freq: Two times a day (BID) | ORAL | 2 refills | Status: DC | PRN
  Filled 2021-12-03: qty 20, 10d supply, fill #0

## 2021-12-03 MED ORDER — OXYCODONE-ACETAMINOPHEN 5-325 MG PO TABS
1.0000 | ORAL_TABLET | Freq: Four times a day (QID) | ORAL | 0 refills | Status: DC | PRN
  Filled 2021-12-03: qty 40, 5d supply, fill #0

## 2021-12-03 NOTE — Progress Notes (Signed)
Post-Op Visit Note   [name]           Date of Birth: Jan 26, 1956           MRN: [id number] Visit Date: 12/03/2021 PCP: Sandrea Hughs, NP   Assessment & Plan:  Chief Complaint:  Chief Complaint  Patient presents with   Left Knee - Follow-up    Left total knee arthroplasty 11/18/2021   Visit Diagnoses:  1. S/P total knee arthroplasty, left   2. Primary osteoarthritis of left knee     Plan: Patient is a pleasant 67 year old female who comes in today 2 weeks status post left total knee replacement 11/18/2021.  She has been doing well.  She has been compliant with her Xarelto.  She has been taking Percocet which does seem to help with her pain.  Examination of the left knee reveals a fully healed surgical scar with nylon sutures in place.  No evidence of infection or cellulitis.  Calves are soft and nontender.  She is neurovascular intact distally.  Today, sutures were removed and Steri-Strips applied.  She will continue with her CPM.  Have also sent in a referral for outpatient physical therapy.  Continue wearing compression socks and continue with her Xarelto for DVT prophylaxis.  Dental prophylaxis reinforced.  Follow-up in 4 weeks for repeat evaluation and 2 view x-rays of the left knee.  Call with concerns or questions.  Follow-Up Instructions: Return in about 4 weeks (around 12/31/2021).   Orders:  Orders Placed This Encounter  Procedures   XR Knee 1-2 Views Left   Ambulatory referral to Physical Therapy   Meds ordered this encounter  Medications   oxyCODONE-acetaminophen (PERCOCET) 5-325 MG tablet    Sig: Take 1-2 tablets by mouth every 6 (six) hours as needed.    Dispense:  40 tablet    Refill:  0   methocarbamol (ROBAXIN) 500 MG tablet    Sig: Take 1 tablet (500 mg total) by mouth 2 (two) times daily as needed for muscle spasms.    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   Hypertension    Personal history of chemotherapy    Personal history of radiation therapy     Family History  Problem Relation Age of Onset   Asthma Mother    Arthritis Sister    Asthma Brother    Arthritis Brother     Past Surgical History:  Procedure Laterality Date   BREAST BIOPSY Right 02/03/2020   x2   BREAST BIOPSY Right 02/06/2020   x2   BREAST LUMPECTOMY Right 07/26/2020   BREAST LUMPECTOMY WITH RADIOACTIVE SEED AND SENTINEL LYMPH NODE BIOPSY Right 07/26/2020   Procedure: RIGHT BREAST LUMPECTOMY  WITH RADIOACTIVE SEED X 2 AND SENTINEL LYMPH NODE BIOPSY;  Surgeon: Erroll Luna, MD;  Location: [facility];  Service: General;  Laterality: Right;   IR IMAGING GUIDED PORT INSERTION  02/23/2020   PORT-A-CATH REMOVAL N/A 07/26/2020   Procedure: REMOVAL PORT-A-CATH;  Surgeon: Erroll Luna, MD;  Location: [facility];  Service: General;  Laterality: N/A;   TOTAL KNEE ARTHROPLASTY Left 11/18/2021   Procedure: LEFT TOTAL KNEE ARTHROPLASTY;  Surgeon: Leandrew Koyanagi, MD;  Location: [facility];  Service: Orthopedics;  Laterality: Left;   Social History   Occupational History   Not on file  Tobacco Use   Smoking status: Never   Smokeless tobacco: Never  Vaping Use   Vaping Use: Never used  Substance and Sexual  Activity   Alcohol use: Yes    Alcohol/week: 1.0 standard drink of alcohol    Types: 1 Glasses of wine per week    Comment: occasionally   Drug use: Never   Sexual activity: Not Currently

## 2021-12-06 ENCOUNTER — Ambulatory Visit: Payer: Self-pay | Attending: Physician Assistant

## 2021-12-06 ENCOUNTER — Other Ambulatory Visit: Payer: Self-pay

## 2021-12-06 DIAGNOSIS — R262 Difficulty in walking, not elsewhere classified: Secondary | ICD-10-CM | POA: Insufficient documentation

## 2021-12-06 DIAGNOSIS — G8929 Other chronic pain: Secondary | ICD-10-CM | POA: Insufficient documentation

## 2021-12-06 DIAGNOSIS — R600 Localized edema: Secondary | ICD-10-CM | POA: Insufficient documentation

## 2021-12-06 DIAGNOSIS — Z96652 Presence of left artificial knee joint: Secondary | ICD-10-CM | POA: Insufficient documentation

## 2021-12-06 DIAGNOSIS — M6281 Muscle weakness (generalized): Secondary | ICD-10-CM | POA: Insufficient documentation

## 2021-12-06 DIAGNOSIS — M25562 Pain in left knee: Secondary | ICD-10-CM | POA: Insufficient documentation

## 2021-12-06 NOTE — Therapy (Signed)
OUTPATIENT PHYSICAL THERAPY LOWER EXTREMITY EVALUATION   Patient Name: Geonna Lockyer MRN: [id number] DOB:[birth year], 66 y.o., female Today's Date: 12/06/2021   PT End of Session - 12/06/21 1349     Visit Number 1    Number of Visits 17    Date for PT Re-Evaluation 02/07/22    Authorization Type None    Authorization Time Period FOTO v6, v10    PT Start Time 1350    PT Stop Time 1423    PT Time Calculation (min) 33 min    Activity Tolerance Patient tolerated treatment well    Behavior During Therapy WFL for tasks assessed/performed             Past Medical History:  Diagnosis Date   Arthritis    Breast cancer ([place])    Cancer ([place])    breast   Diabetes mellitus without complication ([place])    History of MRI    Knee, Hip, and Back.   Hypertension    Personal history of chemotherapy    Personal history of radiation therapy    Past Surgical History:  Procedure Laterality Date   BREAST BIOPSY Right 02/03/2020   x2   BREAST BIOPSY Right 02/06/2020   x2   BREAST LUMPECTOMY Right 07/26/2020   BREAST LUMPECTOMY WITH RADIOACTIVE SEED AND SENTINEL LYMPH NODE BIOPSY Right 07/26/2020   Procedure: RIGHT BREAST LUMPECTOMY WITH RADIOACTIVE SEED X 2 AND SENTINEL LYMPH NODE BIOPSY;  Surgeon: Erroll Luna, MD;  Location: [facility];  Service: General;  Laterality: Right;   IR IMAGING GUIDED PORT INSERTION  02/23/2020   PORT-A-CATH REMOVAL N/A 07/26/2020   Procedure: REMOVAL PORT-A-CATH;  Surgeon: Erroll Luna, MD;  Location: [facility];  Service: General;  Laterality: N/A;   TOTAL KNEE ARTHROPLASTY Left 11/18/2021   Procedure: LEFT TOTAL KNEE ARTHROPLASTY;  Surgeon: Leandrew Koyanagi, MD;  Location: [facility];  Service: Orthopedics;  Laterality: Left;   Patient Active Problem List   Diagnosis Date Noted   Status post total left knee replacement 11/18/2021   Primary osteoarthritis of left knee 08/06/2021   Aortic atherosclerosis ([place]) 04/24/2020   Deep  venous thrombosis of left upper extremity ([place]) 04/24/2020   Port-A-Cath in place 03/26/2020   Malignant neoplasm of upper-outer quadrant of right breast in female, estrogen receptor positive ([place]) 02/13/2020    PCP: Sandrea Hughs, NP  REFERRING PROVIDER: Aundra Dubin, PA-C  REFERRING DIAG: [phone] (ICD-10-CM) - S/P total knee arthroplasty, left  THERAPY DIAG:  Chronic pain of left knee - Plan: PT plan of care cert/re-cert  Localized edema - Plan: PT plan of care cert/re-cert  Difficulty in walking, not elsewhere classified - Plan: PT plan of care cert/re-cert  Muscle weakness (generalized) - Plan: PT plan of care cert/re-cert  Rationale for Evaluation and Treatment Rehabilitation  ONSET DATE: 11/18/2021  SUBJECTIVE:   SUBJECTIVE STATEMENT: Pt reports with primary c/o Lt knee pain and stiffness 18 days s/p Lt knee TKA on 11/18/2021 with Dr. Frankey Shown. She reports the exercises she has been working working on at home include ankle pumps and using her passive motion machine. She reports her pain has improved since her surgery, but she is still having moderate to severe pain. Pt endorses Lt LE N/T from her incision down to her foot. Aggravating factors include walking >5 minutes with FWW, standing after prolonged sitting, and bending knee. Easing factors include ice, elevation, and pain medication. Current pain is 7/10. Worst pain is 10/10. Best pain  is 5/10.  PERTINENT HISTORY: Lt TKA on 11/18/2021 with Dr. Frankey Shown, DMII, HTN, Hx of breast cx with chemo/ radiation/ surgery (in remission)  PAIN:  Are you having pain? Yes: NPRS scale: 7/10 Pain location: Lt knee Pain description: Achy, sharp Aggravating factors: walking >5 minutes with FWW, standing after prolonged sitting, and bending knee Relieving factors: ice, elevation, and pain medication  PRECAUTIONS: Knee and Fall  WEIGHT BEARING RESTRICTIONS No  FALLS:  Has patient fallen in last 6 months? No  LIVING  ENVIRONMENT: Lives with: lives with their family Lives in: House/apartment Stairs: Yes: External: 1 steps; none Has following equipment at home: Single point cane, Walker - 2 wheeled, bed side commode, and Grab bars  OCCUPATION: Unemployed  PLOF: [place] Return to walking, cooking, household chores, playing with grandkids   OBJECTIVE:   DIAGNOSTIC FINDINGS: N/A  PATIENT SURVEYS:  FOTO 38%, predicted 61% in 13 visits  COGNITION:  Overall cognitive status: Within functional limits for tasks assessed     SENSATION: Light touch: Lt L2-S2 dermatomes impaired vs Rt  EDEMA:  Circumferential: 46.5cm on Rt, 52cm on Lt   POSTURE: flexed trunk  and weight shift right  PALPATION: TTP along Lt surgical incision  LOWER EXTREMITY ROM:  A/PROM Right eval Left eval  Knee flexion 110 80p!,90p!  Knee extension 2 -9p!, -5p!   (Blank rows = not tested)  LOWER EXTREMITY MMT:  MMT Right eval Left eval  Hip flexion 4/5 3/5  Hip extension 3/5 3/5  Hip abduction 3+/5 3/5  Knee flexion 5/5 3/p!  Knee extension 3+/5p! 2+/5p!   (Blank rows = not tested)   FUNCTIONAL TESTS:  5xSTS: 29 seconds SLR: roughly 10 degrees of quad lag throughout motion  GAIT: Distance walked: 10f Assistive device utilized: WEnvironmental consultant- 2 wheeled Level of assistance: Modified independence Comments: Decreased gait speed, Lt antalgic gait with forward posture    TODAY'S TREATMENT: 12/06/2021: Demonstrated and issued HEP   PATIENT EDUCATION:  Education details: Pt educated on prognosis, POC, FOTO, and HEP Person educated: Patient Education method: EConsulting civil engineer Demonstration, and Handouts Education comprehension: verbalized understanding and returned demonstration   HOME EXERCISE PROGRAM: Access Code: [email or web address]: [email or web address] Date: 12/06/2021 Prepared by: TVanessa [place] Exercises - Straight Leg Raise  - 2 x daily - 7 x weekly - 3 sets - 10 reps  - 3-sec hold - Heel slide with strap, LAYING ON BACK  - 2 x daily - 7 x weekly - 3 sets - 10 reps - 5-sec hold - Seated Hamstring Stretch  - 2 x daily - 7 x weekly - 2 sets - 1-min hold - Step Up  - 2 x daily - 7 x weekly - 3 sets - 20 reps - 4 Way Patellar Glide  - 2 x daily - 7 x weekly - 2 sets - 10 reps  ASSESSMENT:  CLINICAL IMPRESSION: Patient is a 66y.o. F who was seen today for physical therapy evaluation and treatment for Lt knee pain and stiffness s/p Lt knee TKA on 11/18/2021 with Dr. NFrankey Shown Upon assessment, her primary impairments include limited and painful Lt knee flexion and extension AROM, weak Lt quads and hamstrings, weak BIL global hips, difficulty with transfers and walking, tenderness about the surgical incision, and increased Lt knee swelling. The pt will benefit from skilled PT to address her primary impairments and return to her prior level of function with less limitation.   OBJECTIVE IMPAIRMENTS Abnormal gait, decreased balance, decreased coordination,  decreased endurance, decreased knowledge of use of DME, decreased mobility, difficulty walking, decreased ROM, decreased strength, hypomobility, increased edema, impaired flexibility, improper body mechanics, postural dysfunction, and pain.   ACTIVITY LIMITATIONS carrying, lifting, bending, sitting, standing, squatting, sleeping, stairs, transfers, bed mobility, toileting, dressing, and locomotion level  PARTICIPATION LIMITATIONS: cleaning, laundry, interpersonal relationship, driving, shopping, community activity, occupation, and yard work  PERSONAL FACTORS 3+ comorbidities: See medical hx  are also affecting patient's functional outcome.   REHAB POTENTIAL: Good  CLINICAL DECISION MAKING: Stable/uncomplicated  EVALUATION COMPLEXITY: Low   GOALS: Goals reviewed with patient? Yes  SHORT TERM GOALS: Target date: 01/03/2022  Pt will report understanding and adherence to initial HEP in order to promote  independence in the management of primary impairments. Baseline: HEP provided at eval Goal status: INITIAL  2.  Pt will achieve Lt knee AROM of 0-100 degrees in order to progress to future stage of post-op protocol. Baseline: -9-80 Goal status: INITIAL  3.  Pt will achieve 10 SLR with no quad lag in order to progress to future stage of post-op protocol. Baseline: 10 degrees of quad lag with 1 rep Goal status: INITIAL   LONG TERM GOALS: Target date: 01/31/2022   Pt will achieve FOTO score of 61% in order to demonstrate improved functional ability as it relates to her primary knee impairments. Baseline: 38% Goal status: INITIAL  2.  Pt will achieve Lt knee AROM of 0-120 degrees in order to establish a normal gait pattern for community ambulation. Baseline: -9-80 Goal status: INITIAL  3.  Pt will achieve global BIL LE strength of 4+/5 or greater in order to progress her independent LE strengthening regimen with less limitation. Baseline: See MMT chart Goal status: INITIAL  4.  Pt will report ability to walk > 20 minutes without AD in order to grocery shop with less limitation. Baseline: 5 minutes with FWW Goal status: INITIAL  5.  Pt will achieve a 5xSTS in 14 seconds or less without UE support in order to demonstrate safe transfers. Baseline: 29 seconds with UE support seconds with UE support Goal status: INITIAL    PLAN: PT FREQUENCY: 2x/week  PT DURATION: 8 weeks  PLANNED INTERVENTIONS: Therapeutic exercises, Therapeutic activity, Neuromuscular re-education, Balance training, Gait training, Patient/Family education, Self Care, Joint mobilization, Stair training, Orthotic/Fit training, DME instructions, Aquatic Therapy, Dry Needling, Electrical stimulation, Cryotherapy, Taping, Vasopneumatic device, Biofeedback, Ionotophoresis '4mg'$ /ml Dexamethasone, Manual therapy, and Re-evaluation  PLAN FOR NEXT SESSION: Progress early knee strengthening/ ROM in accordance with post-op  protocol   Vanessa [place], PT, DPT 12/06/21 2:29 PM

## 2021-12-09 ENCOUNTER — Other Ambulatory Visit (HOSPITAL_COMMUNITY): Payer: Self-pay

## 2021-12-18 ENCOUNTER — Ambulatory Visit: Payer: No Typology Code available for payment source

## 2021-12-20 ENCOUNTER — Ambulatory Visit: Payer: No Typology Code available for payment source

## 2021-12-24 ENCOUNTER — Ambulatory Visit: Payer: No Typology Code available for payment source | Attending: Physician Assistant

## 2021-12-24 DIAGNOSIS — M6281 Muscle weakness (generalized): Secondary | ICD-10-CM | POA: Insufficient documentation

## 2021-12-24 DIAGNOSIS — R600 Localized edema: Secondary | ICD-10-CM | POA: Insufficient documentation

## 2021-12-24 DIAGNOSIS — M25562 Pain in left knee: Secondary | ICD-10-CM | POA: Insufficient documentation

## 2021-12-24 DIAGNOSIS — R262 Difficulty in walking, not elsewhere classified: Secondary | ICD-10-CM | POA: Insufficient documentation

## 2021-12-24 DIAGNOSIS — G8929 Other chronic pain: Secondary | ICD-10-CM | POA: Insufficient documentation

## 2021-12-24 NOTE — Therapy (Signed)
OUTPATIENT PHYSICAL THERAPY TREATMENT NOTE   Patient Name: Ashlynd Michna MRN: [id number] DOB:Jul 08, 1955, 66 y.o., female Today's Date: 12/24/2021  PCP: Sandrea Hughs, NP REFERRING PROVIDER: Aundra Dubin, PA-C  END OF SESSION:   PT End of Session - 12/24/21 1707     Visit Number 2    Number of Visits 17    Date for PT Re-Evaluation 02/07/22    Authorization Type None    Authorization Time Period FOTO v6, v10    PT Start Time 1707    PT Stop Time 1750    PT Time Calculation (min) 43 min             Past Medical History:  Diagnosis Date   Arthritis    Breast cancer ([place])    Cancer ([place])    breast   Diabetes mellitus without complication ([place])    History of MRI    Knee, Hip, and Back.   Hypertension    Personal history of chemotherapy    Personal history of radiation therapy    Past Surgical History:  Procedure Laterality Date   BREAST BIOPSY Right 02/03/2020   x2   BREAST BIOPSY Right 02/06/2020   x2   BREAST LUMPECTOMY Right 07/26/2020   BREAST LUMPECTOMY WITH RADIOACTIVE SEED AND SENTINEL LYMPH NODE BIOPSY Right 07/26/2020   Procedure: RIGHT BREAST LUMPECTOMY WITH RADIOACTIVE SEED X 2 AND SENTINEL LYMPH NODE BIOPSY;  Surgeon: Erroll Luna, MD;  Location: [facility];  Service: General;  Laterality: Right;   IR IMAGING GUIDED PORT INSERTION  02/23/2020   PORT-A-CATH REMOVAL N/A 07/26/2020   Procedure: REMOVAL PORT-A-CATH;  Surgeon: Erroll Luna, MD;  Location: [facility];  Service: General;  Laterality: N/A;   TOTAL KNEE ARTHROPLASTY Left 11/18/2021   Procedure: LEFT TOTAL KNEE ARTHROPLASTY;  Surgeon: Leandrew Koyanagi, MD;  Location: [facility];  Service: Orthopedics;  Laterality: Left;   Patient Active Problem List   Diagnosis Date Noted   Status post total left knee replacement 11/18/2021   Primary osteoarthritis of left knee 08/06/2021   Aortic atherosclerosis ([place]) 04/24/2020   Deep venous thrombosis of left upper  extremity ([place]) 04/24/2020   Port-A-Cath in place 03/26/2020   Malignant neoplasm of upper-outer quadrant of right breast in female, estrogen receptor positive ([place]) 02/13/2020    REFERRING DIAG: V56.433 (ICD-10-CM) - S/P total knee arthroplasty, left  THERAPY DIAG:  Chronic pain of left knee  Localized edema  Difficulty in walking, not elsewhere classified  Muscle weakness (generalized)  Rationale for Evaluation and Treatment Rehabilitation  PERTINENT HISTORY: Lt TKA on 11/18/2021 with Dr. Frankey Shown, DMII, HTN, Hx of breast cx with chemo/ radiation/ surgery (in remission)  PRECAUTIONS: Knee and Fall  ONSET DATE: 11/18/2021  SUBJECTIVE: Patient reports more stiffness than pain in her knee. She reports that she got up to 100 on her CPM machine before they took it.   PAIN:  Are you having pain? Yes: NPRS scale: 6/10 Pain location: Lt knee Pain description: Achy, sharp Aggravating factors: walking >5 minutes with FWW, standing after prolonged sitting, and bending knee Relieving factors: ice, elevation, and pain medication   OBJECTIVE: (objective measures completed at initial evaluation unless otherwise dated)   DIAGNOSTIC FINDINGS: N/A   PATIENT SURVEYS:  FOTO 38%, predicted 61% in 13 visits   COGNITION:           Overall cognitive status: Within functional limits for tasks assessed  SENSATION: Light touch: Lt L2-S2 dermatomes impaired vs Rt   EDEMA:  Circumferential: 46.5cm on Rt, 52cm on Lt     POSTURE: flexed trunk  and weight shift right   PALPATION: TTP along Lt surgical incision   LOWER EXTREMITY ROM:   A/PROM Right eval Left eval 12/24/21 Left  Knee flexion 110 80p!,90p! 100/109 p!  Knee extension 2 -9p!, -5p! -3   (Blank rows = not tested)   LOWER EXTREMITY MMT:   MMT Right eval Left eval  Hip flexion 4/5 3/5  Hip extension 3/5 3/5  Hip abduction 3+/5 3/5  Knee flexion 5/5 3/p!  Knee extension 3+/5p! 2+/5p!    (Blank rows = not tested)     FUNCTIONAL TESTS:  5xSTS: 29 seconds SLR: roughly 10 degrees of quad lag throughout motion   GAIT: Distance walked: 67f Assistive device utilized: WEnvironmental consultant- 2 wheeled Level of assistance: Modified independence Comments: Decreased gait speed, Lt antalgic gait with forward posture       TODAY'S TREATMENT: [facility] Adult PT Treatment:                                                DATE: 12/24/2021 Therapeutic Exercise: LAQ 3x10 Seated heel slides using Lt leg to pull back 5" hold x5 SLR 2x10 Supine hip adduction ball squeeze 5" hold 2x10 Supine clamshell GTB 3x10 Supine marching GTB 2x10 BIL Seated marching 2x10 BIL Heel slides with strap 5" hold x10 Manual Therapy: Manual flexion/extension stretch AP mobs to improve flexion/extension Patellar mobs all directions Therapeutic Activity: Gait with SPC x185' (switched cane to opposite hand, gait became less antalgic) Modalities: Modalities: Vasopneumatic (Game Ready)   Location:  left knee Time:  10 minutes Pressure:  low Temperature:  34 degrees  12/06/2021: Demonstrated and issued HEP     PATIENT EDUCATION:  Education details: Pt educated on prognosis, POC, FOTO, and HEP Person educated: Patient Education method: EConsulting civil engineer DMedia planner and Handouts Education comprehension: verbalized understanding and returned demonstration     HOME EXERCISE PROGRAM: Access Code: [email or web address]: [email or web address] Date: 12/06/2021 Prepared by: TVanessa [place]  Exercises - Straight Leg Raise  - 2 x daily - 7 x weekly - 3 sets - 10 reps - 3-sec hold - Heel slide with strap, LAYING ON BACK  - 2 x daily - 7 x weekly - 3 sets - 10 reps - 5-sec hold - Seated Hamstring Stretch  - 2 x daily - 7 x weekly - 2 sets - 1-min hold - Step Up  - 2 x daily - 7 x weekly - 3 sets - 20 reps - 4 Way Patellar Glide  - 2 x daily - 7 x weekly - 2 sets - 10 reps   ASSESSMENT:   CLINICAL  IMPRESSION: Patient presents to PT ambulating with SPC and reports HEP compliance and states the knee feels more stiff than painful. Session today focused on Lt knee strengthening and ROM. PROM from 3-109 degrees achieved today with pain at end range flexion. Patient was able to tolerate all prescribed exercises with no adverse effects. Patient continues to benefit from skilled PT services and should be progressed as able to improve functional independence.     OBJECTIVE IMPAIRMENTS Abnormal gait, decreased balance, decreased coordination, decreased endurance, decreased knowledge of use of DME, decreased mobility, difficulty walking, decreased ROM, decreased strength, hypomobility, increased edema,  impaired flexibility, improper body mechanics, postural dysfunction, and pain.    ACTIVITY LIMITATIONS carrying, lifting, bending, sitting, standing, squatting, sleeping, stairs, transfers, bed mobility, toileting, dressing, and locomotion level   PARTICIPATION LIMITATIONS: cleaning, laundry, interpersonal relationship, driving, shopping, community activity, occupation, and yard work   PERSONAL FACTORS 3+ comorbidities: See medical hx  are also affecting patient's functional outcome.    REHAB POTENTIAL: Good   CLINICAL DECISION MAKING: Stable/uncomplicated   EVALUATION COMPLEXITY: Low     GOALS: Goals reviewed with patient? Yes   SHORT TERM GOALS: Target date: 01/03/2022  Pt will report understanding and adherence to initial HEP in order to promote independence in the management of primary impairments. Baseline: HEP provided at eval Goal status: INITIAL   2.  Pt will achieve Lt knee AROM of 0-100 degrees in order to progress to future stage of post-op protocol. Baseline: -9-80 Goal status: INITIAL   3.  Pt will achieve 10 SLR with no quad lag in order to progress to future stage of post-op protocol. Baseline: 10 degrees of quad lag with 1 rep Goal status: INITIAL     LONG TERM GOALS:  Target date: 01/31/2022    Pt will achieve FOTO score of 61% in order to demonstrate improved functional ability as it relates to her primary knee impairments. Baseline: 38% Goal status: INITIAL   2.  Pt will achieve Lt knee AROM of 0-120 degrees in order to establish a normal gait pattern for community ambulation. Baseline: -9-80 Goal status: INITIAL   3.  Pt will achieve global BIL LE strength of 4+/5 or greater in order to progress her independent LE strengthening regimen with less limitation. Baseline: See MMT chart Goal status: INITIAL   4.  Pt will report ability to walk > 20 minutes without AD in order to grocery shop with less limitation. Baseline: 5 minutes with FWW Goal status: INITIAL   5.  Pt will achieve a 5xSTS in 14 seconds or less without UE support in order to demonstrate safe transfers. Baseline: 29 seconds with UE support seconds with UE support Goal status: INITIAL       PLAN: PT FREQUENCY: 2x/week   PT DURATION: 8 weeks   PLANNED INTERVENTIONS: Therapeutic exercises, Therapeutic activity, Neuromuscular re-education, Balance training, Gait training, Patient/Family education, Self Care, Joint mobilization, Stair training, Orthotic/Fit training, DME instructions, Aquatic Therapy, Dry Needling, Electrical stimulation, Cryotherapy, Taping, Vasopneumatic device, Biofeedback, Ionotophoresis '4mg'$ /ml Dexamethasone, Manual therapy, and Re-evaluation   PLAN FOR NEXT SESSION: Progress early knee strengthening/ ROM in accordance with post-op protocol    Margarette [place], PTA 12/24/2021, 5:42 PM

## 2021-12-25 ENCOUNTER — Inpatient Hospital Stay (HOSPITAL_BASED_OUTPATIENT_CLINIC_OR_DEPARTMENT_OTHER): Payer: No Typology Code available for payment source | Admitting: Hematology and Oncology

## 2021-12-25 ENCOUNTER — Other Ambulatory Visit: Payer: Self-pay

## 2021-12-25 ENCOUNTER — Encounter: Payer: Self-pay | Admitting: Hematology and Oncology

## 2021-12-25 ENCOUNTER — Inpatient Hospital Stay: Payer: No Typology Code available for payment source | Attending: Hematology and Oncology

## 2021-12-25 VITALS — BP 124/77 | HR 74 | Temp 98.4°F | Resp 16 | Ht 66.0 in | Wt 209.3 lb

## 2021-12-25 DIAGNOSIS — Z170 Estrogen receptor positive status [ER+]: Secondary | ICD-10-CM | POA: Insufficient documentation

## 2021-12-25 DIAGNOSIS — Z79811 Long term (current) use of aromatase inhibitors: Secondary | ICD-10-CM

## 2021-12-25 DIAGNOSIS — C50411 Malignant neoplasm of upper-outer quadrant of right female breast: Secondary | ICD-10-CM | POA: Insufficient documentation

## 2021-12-25 DIAGNOSIS — Z923 Personal history of irradiation: Secondary | ICD-10-CM | POA: Insufficient documentation

## 2021-12-25 LAB — CBC WITH DIFFERENTIAL (CANCER CENTER ONLY)
Abs Immature Granulocytes: 0 10*3/uL (ref 0.00–0.07)
Basophils Absolute: 0 10*3/uL (ref 0.0–0.1)
Basophils Relative: 1 %
Eosinophils Absolute: 0.1 10*3/uL (ref 0.0–0.5)
Eosinophils Relative: 4 %
HCT: 27.7 % — ABNORMAL LOW (ref 36.0–46.0)
Hemoglobin: 9.4 g/dL — ABNORMAL LOW (ref 12.0–15.0)
Immature Granulocytes: 0 %
Lymphocytes Relative: 40 %
Lymphs Abs: 1.2 10*3/uL (ref 0.7–4.0)
MCH: 35.1 pg — ABNORMAL HIGH (ref 26.0–34.0)
MCHC: 33.9 g/dL (ref 30.0–36.0)
MCV: 103.4 fL — ABNORMAL HIGH (ref 80.0–100.0)
Monocytes Absolute: 0.2 10*3/uL (ref 0.1–1.0)
Monocytes Relative: 6 %
Neutro Abs: 1.4 10*3/uL — ABNORMAL LOW (ref 1.7–7.7)
Neutrophils Relative %: 49 %
Platelet Count: 193 10*3/uL (ref 150–400)
RBC: 2.68 MIL/uL — ABNORMAL LOW (ref 3.87–5.11)
RDW: 14.6 % (ref 11.5–15.5)
WBC Count: 2.9 10*3/uL — ABNORMAL LOW (ref 4.0–10.5)
nRBC: 0 % (ref 0.0–0.2)

## 2021-12-25 LAB — CMP (CANCER CENTER ONLY)
ALT: 14 U/L (ref 0–44)
AST: 15 U/L (ref 15–41)
Albumin: 3.6 g/dL (ref 3.5–5.0)
Alkaline Phosphatase: 63 U/L (ref 38–126)
Anion gap: 6 (ref 5–15)
BUN: 8 mg/dL (ref 8–23)
CO2: 23 mmol/L (ref 22–32)
Calcium: 9.3 mg/dL (ref 8.9–10.3)
Chloride: 106 mmol/L (ref 98–111)
Creatinine: 0.9 mg/dL (ref 0.44–1.00)
GFR, Estimated: >60 mL/min (ref >60)
Glucose, Bld: 103 mg/dL — ABNORMAL HIGH (ref 70–99)
Potassium: 4.7 mmol/L (ref 3.5–5.1)
Sodium: 135 mmol/L (ref 135–145)
Total Bilirubin: 0.2 mg/dL — ABNORMAL LOW (ref 0.3–1.2)
Total Protein: 6.8 g/dL (ref 6.5–8.1)

## 2021-12-25 MED ORDER — ANASTROZOLE 1 MG PO TABS: 1.0000 mg | ORAL_TABLET | Freq: Every day | ORAL | 4 refills | Status: DC

## 2021-12-25 MED ORDER — ABEMACICLIB 100 MG PO TABS: 100.0000 mg | ORAL_TABLET | Freq: Two times a day (BID) | ORAL | 3 refills | Status: DC

## 2021-12-25 NOTE — Progress Notes (Signed)
[place]  Telephone:(336) [phone] Fax:(336) [phone]     ID: [name] [name] DOB: February 24, 1956  MR#: [id number]  CSN#:[id number]  Patient Care Team: Sandrea Hughs, NP as PCP - General (Family Medicine) Mauro Kaufmann, RN as Oncology Nurse Navigator Rockwell Germany, RN as Oncology Nurse Navigator Erroll Luna, MD as Consulting Physician (General Surgery) Eppie Gibson, MD as Attending Physician (Radiation Oncology) Benay Pike, MD as Medical Oncologist (Hematology and Oncology) Raina Mina, RPH-CPP as Pharmacist (Hematology and Oncology) Benay Pike, MD  OTHER MD:  CHIEF COMPLAINT: Estrogen receptor positive breast cancer  CURRENT TREATMENT: Anastrozole with abemaciclib.  INTERVAL HISTORY:  [name] [name] returns today for follow up of her estrogen receptor positive breast cancer.  She is on adjuvant anastrazole with abemaciclib. She said she ran out of anastrozole for the past 3/4 days.  Her pharmacy was trying to get a refill but she states that no one called her back. She said she didn't feel good, feels very cold, heartburn. She had some nausea and vomiting Appetite is not good. No change in breathing.  She feels like she has been tolerating this current dose well. She has noted some diarrhea as well, going 3/ 4 times a day, couple days of the week. Rest of the pertinent 10 point ROS reviewed and negative.    COVID 19 VACCINATION STATUS: s/p  [employer]  X2, most recently June 2021, no booster as of 05/01/2020   HISTORY OF CURRENT ILLNESS: From the original intake note:  [name] [name] herself palpated a mass in the right axilla "a long time ago", more recently with complaints of associated pain. She brought it to medical attention and underwent bilateral diagnostic mammography with tomography and bilateral breast ultrasonography at [facility] on 01/31/2020 showing: breast density category B; three right breast masses-- 1.9 cm at 12 o'clock, 2.3  cm at 11 o'clock, 0.4 cm at 1 o'clock; two adjacent enlarged thickened lymph nodes measuring up to 2.1 cm; no evidence of left breast malignancy.  Accordingly on 02/03/2020 she proceeded to biopsy of the right breast area at 12 o'clock and right axilla. The pathology from this procedure ([id number]) showed: invasive ductal carcinoma, grade 3. Prognostic indicators significant for: estrogen receptor, 100% positive with strong staining intensity and progesterone receptor, 0% negative. Proliferation marker Ki67 at 60%. HER2 equivocal by immunohistochemistry (2+), but negative by fluorescent in situ hybridization with a signals ratio 0.81 and number per cell 2.7.  The biopsied lymph node was found to show metastatic carcinoma. The morphology was considered different from the biopsied mass, and a prognostic panel was performed. Estrogen receptor, 100% positive with strong staining intensity and progesterone receptor, 0% negative. Proliferation marker Ki67 at 70%. HER2 equivcoal by immunohistochemistry (2+), but negative) by fluorescent in situ hybridization with a signals ratio 1.16 and number per cell 3.9.  She underwent additional right breast biopsies on 02/06/2020. Pathology [id number]) from the mass at 11 o'clock showed: invasive ductal carcinoma, grade 2. Prognostic indicators significant for: estrogen receptor, 100% positive with strong staining intensity and progesterone receptor, 0% negative. Proliferation marker Ki67 at 15%. HER2 equivocal by immunohistochemistry (2+), but negative by fluorescent in situ hybridization with a signals ratio 1.29 and number per cell 2.7.  The biopsied mass at 1 o'clock showed only fibrocystic change.  The patient's subsequent history is as detailed below.   PAST MEDICAL HISTORY: Past Medical History:  Diagnosis Date   Arthritis    Breast cancer ([place])    Cancer ([place])  breast   Diabetes mellitus without complication ([place])    History of MRI    Knee, Hip, and  Back.   Hypertension    Personal history of chemotherapy    Personal history of radiation therapy     PAST SURGICAL HISTORY: Past Surgical History:  Procedure Laterality Date   BREAST BIOPSY Right 02/03/2020   x2   BREAST BIOPSY Right 02/06/2020   x2   BREAST LUMPECTOMY Right 07/26/2020   BREAST LUMPECTOMY WITH RADIOACTIVE SEED AND SENTINEL LYMPH NODE BIOPSY Right 07/26/2020   Procedure: RIGHT BREAST LUMPECTOMY WITH RADIOACTIVE SEED X 2 AND SENTINEL LYMPH NODE BIOPSY;  Surgeon: Erroll Luna, MD;  Location: [facility];  Service: General;  Laterality: Right;   IR IMAGING GUIDED PORT INSERTION  02/23/2020   PORT-A-CATH REMOVAL N/A 07/26/2020   Procedure: REMOVAL PORT-A-CATH;  Surgeon: Erroll Luna, MD;  Location: [facility];  Service: General;  Laterality: N/A;   TOTAL KNEE ARTHROPLASTY Left 11/18/2021   Procedure: LEFT TOTAL KNEE ARTHROPLASTY;  Surgeon: Leandrew Koyanagi, MD;  Location: [facility];  Service: Orthopedics;  Laterality: Left;    FAMILY HISTORY: Family History  Problem Relation Age of Onset   Asthma Mother    Arthritis Sister    Asthma Brother    Arthritis Brother   [name] died in his 72s from causes unknown to the patient.  [name] died from heart disease at age 72.  The patient had 4 brothers, 1 sister, with no history of cancer in the family to her knowledge   GYNECOLOGIC HISTORY:  No LMP recorded. Patient is postmenopausal. Menarche: 66 years old Age at first live birth: 66 years old [place] P 1 LMP 72s Contraceptive HRT no  Hysterectomy? no BSO? no   SOCIAL HISTORY: (updated 02/2020)  [name] [name] is originally from [place].  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  She describes herself as single.  At home she lives with her daughter [name] [name] who works for [employer], her daughters' husband [name] [name] who is a English as a second language teacher, and their children who are 48 months old and 37 years old.  The patient is  [name] [name]    ADVANCED DIRECTIVES: Not in place.  The patient tells me she would intend to name her daughter [name] [name] as her healthcare power of attorney   HEALTH MAINTENANCE: Social History   Tobacco Use   Smoking status: Never   Smokeless tobacco: Never  Vaping Use   Vaping Use: Never used  Substance Use Topics   Alcohol use: Yes    Alcohol/week: 1.0 standard drink of alcohol    Types: 1 Glasses of wine per week    Comment: occasionally   Drug use: Never     Colonoscopy: never done  PAP: 2017 (performed in [place])  Bone density:    Allergies  Allergen Reactions   Sulfa Antibiotics Itching    Current Outpatient Medications  Medication Sig Dispense Refill   abemaciclib (VERZENIO) 150 MG tablet Take 150 mg by mouth 2 (two) times daily.     anastrozole (ARIMIDEX) 1 MG tablet Take 1 tablet (1 mg total) by mouth daily. 90 tablet 4   carvedilol (COREG) 6.25 MG tablet Take 6.25 mg by mouth 2 (two) times daily with a meal.     cephALEXin (KEFLEX) 500 MG capsule Take 1 capsule (500 mg total) by mouth 4 (four) times daily. To be taken after surgery 40 capsule 0   docusate sodium (  COLACE) 100 MG capsule Take 1 capsule (100 mg total) by mouth daily as needed. 30 capsule 2   Garlic 3818 MG CAPS Take 1,000 mg by mouth daily.     lansoprazole (PREVACID) 30 MG capsule Take 30 mg by mouth every morning.     loperamide (IMODIUM) 2 MG capsule Take 2 mg by mouth as needed for diarrhea or loose stools.     Magnesium 100 MG CAPS Take 100 mg by mouth 2 (two) times daily.     metFORMIN (GLUCOPHAGE) 1000 MG tablet Take 1 tablet (1,000 mg total) by mouth 2 (two) times daily with a meal. 180 tablet 1   methocarbamol (ROBAXIN) 500 MG tablet Take 1 tablet (500 mg total) by mouth 2 (two) times daily as needed for muscle spasms. 20 tablet 2   Multiple Vitamins-Minerals (CENTRUM SILVER 50+WOMEN PO) Take 1 capsule by mouth daily.     ondansetron (ZOFRAN) 4 MG tablet Take 1 tablet (4 mg total) by mouth  every 8 (eight) hours as needed for nausea or vomiting. 40 tablet 0   oxyCODONE-acetaminophen (PERCOCET) 5-325 MG tablet Take 1-2 tablets by mouth every 6 (six) hours as needed. 40 tablet 0   rivaroxaban (XARELTO) 10 MG TABS tablet Take 1 tablet (10 mg total) by mouth daily. Take as directed after surgery. 30 tablet 0   UNABLE TO FIND Med Name: Eleanor. Pt reports taking 1 scoopful in tea or water     valsartan (DIOVAN) 320 MG tablet Take 320 mg by mouth daily.     vitamin C (ASCORBIC ACID) 500 MG tablet Take 500 mg by mouth daily.     No current facility-administered medications for this visit.    OBJECTIVE: African-American woman in no acute distress  Vitals:   12/25/21 1513  BP: 124/77  Pulse: 74  Resp: 16  Temp: 98.4 F (36.9 C)  SpO2: 100%       Body mass index is 33.78 kg/m.   Wt Readings from Last 3 Encounters:  12/25/21 209 lb 4.8 oz (94.9 kg)  11/26/21 207 lb 3.2 oz (94 kg)  11/18/21 211 lb (95.7 kg)     ECOG FS:1 - Symptomatic but completely ambulatory  Physical Exam Constitutional:      Appearance: Normal appearance.  Cardiovascular:     Rate and Rhythm: Normal rate and regular rhythm.     Pulses: Normal pulses.     Heart sounds: Normal heart sounds.  Chest:     Comments: Right breast status postradiation changes. Lower portion of the nipple is more everted today, Hematoma appears better. No other palpable masses. No regional adenopathy. Left breast normal to inspection and palpation. Musculoskeletal:     Cervical back: Normal range of motion and neck supple. No rigidity.  Lymphadenopathy:     Cervical: No cervical adenopathy.  Skin:    General: Skin is warm and dry.  Neurological:     General: No focal deficit present.     Mental Status: She is alert.       LAB RESULTS:  CMP     Component Value Date/Time   NA 129 (L) 11/26/2021 1438   K 4.6 11/26/2021 1438   CL 94 (L) 11/26/2021 1438   CO2 27 11/26/2021 1438   GLUCOSE 128 (H) 11/26/2021  1438   BUN 8 11/26/2021 1438   CREATININE 0.79 11/26/2021 1438   CREATININE 0.59 08/14/2021 0807   CALCIUM 9.8 11/26/2021 1438   PROT 7.2 11/26/2021 1438   ALBUMIN 4.2 11/26/2021  1438   AST 26 11/26/2021 1438   ALT 29 11/26/2021 1438   ALKPHOS 88 11/26/2021 1438   BILITOT 0.7 11/26/2021 1438   GFRNONAA >60 11/26/2021 1438    No results found for: "TOTALPROTELP", "ALBUMINELP", "A1GS", "A2GS", "BETS", "BETA2SER", "GAMS", "MSPIKE", "SPEI"  Lab Results  Component Value Date   WBC 2.9 (L) 12/25/2021   NEUTROABS 1.4 (L) 12/25/2021   HGB 9.4 (L) 12/25/2021   HCT 27.7 (L) 12/25/2021   MCV 103.4 (H) 12/25/2021   PLT 193 12/25/2021    No results found for: "LABCA2"  No components found for: "ZHYQMV784"  No results for input(s): "INR" in the last 168 hours.  No results found for: "LABCA2"  No results found for: "ONG295"  No results found for: "CAN125"  No results found for: "CAN153"  No results found for: "CA2729"  No components found for: "HGQUANT"  No results found for: "CEA1", "CEA" / No results found for: "CEA1", "CEA"   No results found for: "AFPTUMOR"  No results found for: "CHROMOGRNA"  No results found for: "KPAFRELGTCHN", "LAMBDASER", "KAPLAMBRATIO" (kappa/lambda light chains)  No results found for: "HGBA", "HGBA2QUANT", "HGBFQUANT", "HGBSQUAN" (Hemoglobinopathy evaluation)   No results found for: "LDH"  No results found for: "IRON", "TIBC", "IRONPCTSAT" (Iron and TIBC)  No results found for: "FERRITIN"  Urinalysis No results found for: "COLORURINE", "APPEARANCEUR", "LABSPEC", "PHURINE", "GLUCOSEU", "HGBUR", "BILIRUBINUR", "KETONESUR", "PROTEINUR", "UROBILINOGEN", "NITRITE", "LEUKOCYTESUR"   STUDIES: XR Knee 1-2 Views Left  Result Date: 12/03/2021 Well-seated prosthesis without complication    ELIGIBLE FOR AVAILABLE RESEARCH PROTOCOL: AET  ASSESSMENT: 66 y.o. [place] woman status post right breast upper outer quadrant (12:00) and axillary  lymph node biopsy 02/03/2020, both positive for a clinical T1c N1, stage IIB invasive ductal carcinoma, grade 3, estrogen receptor positive, HER-2 and progesterone receptor negative, with an MIB-1 of 60%.  (a) biopsy of a second right upper outer quadrant T2 lesion (11 o'clock) 02/06/2020 also showed invasive ductal carcinoma, but grade 2, estrogen receptor positive, progesterone receptor and HER-2 negative, with an MIB-1 of 15%  (b) biopsy of a third right breast lesion (1:00) was benign   (1) neoadjuvant chemotherapy consisting of cyclophosphamide and docetaxel every 21 days x 4 starting 03/05/2020  (a) discontinued after 2 cycles because of peripheral neuropathy  (b) cyclophosphamide/doxorubicin started 04/24/2020, repeated every 21 days x4, completed 06/25/2020  (c) echo 04/17/2020 showed EF of 55-60%  (2) right lumpectomy 07/26/2020 found a residual pT1c pN1 invasive ductal carcinoma, grade 2, with negative margins  (a) repeat prognostic indicators show the tumor to be estrogen receptor positive, progesterone receptor and HER2 negative, with an MIB-1 of 2%.  (b) a total of 6 right axillary lymph nodes removed, 2 positive  (3) adjuvant radiation completed 10/17/2020  (4) anastrozole started June 2022  (5) CT scan of the chest 04/23/2020 shows a left brachiocephalicl catheter associated clot  (a) rivaroxaban started 04/24/2020, discontinued 08/22/2020  (b) port removed 07/26/2020   PLAN:  Started abemaciclib in March 2023.  She cannot remember why she did not started sooner, she was wondering maybe she wanted to think about it. Last MRI breast Jan 2023 showed no evidence of breast malignancy. Post surgical collection right breast near right nipple which could have been causing the drainage.  Mammogram recommended in 9 months. This has been scheduled for Oct 2023. Physical examination today, postop hematoma/seroma appears to have improved, no nipple drainage , bottom portion of the nipple  everted. No masses or regional adenopathy. She is not tolerating the  verzenio 150 mg po well, has lot of dyspepsia, nausea and diarrhea, will change the dose to 100 mg PO BID. RTC with John in 4 weeks, 8 weeks with me. CBC, CMP every 4 weeks. With regards to osteoporosis, she cannot afford a dentist and has some ongoing dental issues.  We have discussed that without dental clearance her risk of osteonecrosis can be considerably high.  I have advised calcium and vitamin D supplementation in the interim and find a dentist at her convenience.  Total encounter time 30 minutes. *Total Encounter Time as defined by the Centers for Medicare and Medicaid Services includes, in addition to the face-to-face time of a patient visit (documented in the note above) non-face-to-face time: obtaining and reviewing outside history, ordering and reviewing medications, tests or procedures, care coordination (communications with other health care professionals or caregivers) and documentation in the medical record.

## 2021-12-26 ENCOUNTER — Ambulatory Visit: Payer: No Typology Code available for payment source

## 2021-12-26 ENCOUNTER — Telehealth: Payer: Self-pay

## 2021-12-26 DIAGNOSIS — C50411 Malignant neoplasm of upper-outer quadrant of right female breast: Secondary | ICD-10-CM

## 2021-12-26 DIAGNOSIS — R600 Localized edema: Secondary | ICD-10-CM

## 2021-12-26 DIAGNOSIS — G8929 Other chronic pain: Secondary | ICD-10-CM

## 2021-12-26 DIAGNOSIS — M6281 Muscle weakness (generalized): Secondary | ICD-10-CM

## 2021-12-26 DIAGNOSIS — R262 Difficulty in walking, not elsewhere classified: Secondary | ICD-10-CM

## 2021-12-26 MED ORDER — ABEMACICLIB 100 MG PO TABS: 100.0000 mg | ORAL_TABLET | Freq: Two times a day (BID) | ORAL | 3 refills | Status: DC

## 2021-12-26 NOTE — Telephone Encounter (Signed)
Oral Oncology Pharmacist Encounter  Prescription refill for Verzenio sent to [facility] in error. Patient enrolled in manufacturer assistance and receives medication through [employer] patient assistance. Prescription redirected to [facility] (previously known as [facility]).  Drema Halon, PharmD Hematology/Oncology Clinical Pharmacist [facility] [phone] 12/26/2021 8:42 AM

## 2021-12-26 NOTE — Therapy (Signed)
OUTPATIENT PHYSICAL THERAPY TREATMENT NOTE   Patient Name: [name] [name] MRN: [id number] DOB:April 28, 1956, 66 y.o., female Today's Date: 12/26/2021  PCP: Sandrea Hughs, NP REFERRING PROVIDER: Aundra Dubin, PA-C  END OF SESSION:   PT End of Session - 12/26/21 1700     Visit Number 3    Number of Visits 17    Date for PT Re-Evaluation 02/07/22    Authorization Type None    Authorization Time Period FOTO v6, v10    PT Start Time 1700    PT Stop Time 1747    PT Time Calculation (min) 47 min    Activity Tolerance Patient tolerated treatment well    Behavior During Therapy WFL for tasks assessed/performed              Past Medical History:  Diagnosis Date   Arthritis    Breast cancer ([place])    Cancer ([place])    breast   Diabetes mellitus without complication ([place])    History of MRI    Knee, Hip, and Back.   Hypertension    Personal history of chemotherapy    Personal history of radiation therapy    Past Surgical History:  Procedure Laterality Date   BREAST BIOPSY Right 02/03/2020   x2   BREAST BIOPSY Right 02/06/2020   x2   BREAST LUMPECTOMY Right 07/26/2020   BREAST LUMPECTOMY WITH RADIOACTIVE SEED AND SENTINEL LYMPH NODE BIOPSY Right 07/26/2020   Procedure: RIGHT BREAST LUMPECTOMY WITH RADIOACTIVE SEED X 2 AND SENTINEL LYMPH NODE BIOPSY;  Surgeon: Erroll Luna, MD;  Location: [facility];  Service: General;  Laterality: Right;   IR IMAGING GUIDED PORT INSERTION  02/23/2020   PORT-A-CATH REMOVAL N/A 07/26/2020   Procedure: REMOVAL PORT-A-CATH;  Surgeon: Erroll Luna, MD;  Location: [facility];  Service: General;  Laterality: N/A;   TOTAL KNEE ARTHROPLASTY Left 11/18/2021   Procedure: LEFT TOTAL KNEE ARTHROPLASTY;  Surgeon: Leandrew Koyanagi, MD;  Location: [facility];  Service: Orthopedics;  Laterality: Left;   Patient Active Problem List   Diagnosis Date Noted   Status post total left knee replacement 11/18/2021   Primary  osteoarthritis of left knee 08/06/2021   Aortic atherosclerosis ([place]) 04/24/2020   Deep venous thrombosis of left upper extremity ([place]) 04/24/2020   Port-A-Cath in place 03/26/2020   Malignant neoplasm of upper-outer quadrant of right breast in female, estrogen receptor positive ([place]) 02/13/2020    REFERRING DIAG: E95.284 (ICD-10-CM) - S/P total knee arthroplasty, left  THERAPY DIAG:  Chronic pain of left knee  Localized edema  Difficulty in walking, not elsewhere classified  Muscle weakness (generalized)  Rationale for Evaluation and Treatment Rehabilitation  PERTINENT HISTORY: Lt TKA on 11/18/2021 with Dr. Frankey Shown, DMII, HTN, Hx of breast cx with chemo/ radiation/ surgery (in remission)  PRECAUTIONS: Knee and Fall  ONSET DATE: 11/18/2021  SUBJECTIVE: Patient reports high pain in her knee today and that she has been doing her HEP.  PAIN:  Are you having pain? Yes: NPRS scale: 8/10 Pain location: Lt knee Pain description: Achy, sharp Aggravating factors: walking >5 minutes with FWW, standing after prolonged sitting, and bending knee Relieving factors: ice, elevation, and pain medication   OBJECTIVE: (objective measures completed at initial evaluation unless otherwise dated)   DIAGNOSTIC FINDINGS: N/A   PATIENT SURVEYS:  FOTO 38%, predicted 61% in 13 visits   COGNITION:           Overall cognitive status: Within functional limits for tasks assessed  SENSATION: Light touch: Lt L2-S2 dermatomes impaired vs Rt   EDEMA:  Circumferential: 46.5cm on Rt, 52cm on Lt     POSTURE: flexed trunk  and weight shift right   PALPATION: TTP along Lt surgical incision   LOWER EXTREMITY ROM:   A/PROM Right eval Left eval 12/24/21 Left 12/26/21 Left  Knee flexion 110 80p!,90p! 100/109 p! 105/110 p!  Knee extension 2 -9p!, -5p! -3    (Blank rows = not tested)   LOWER EXTREMITY MMT:   MMT Right eval Left eval  Hip flexion 4/5 3/5  Hip  extension 3/5 3/5  Hip abduction 3+/5 3/5  Knee flexion 5/5 3/p!  Knee extension 3+/5p! 2+/5p!   (Blank rows = not tested)     FUNCTIONAL TESTS:  5xSTS: 29 seconds SLR: roughly 10 degrees of quad lag throughout motion   GAIT: Distance walked: 76f Assistive device utilized: WEnvironmental consultant- 2 wheeled Level of assistance: Modified independence Comments: Decreased gait speed, Lt antalgic gait with forward posture       TODAY'S TREATMENT: [facility] Adult PT Treatment:                                                DATE: 12/26/21 Therapeutic Exercise: Nustep level 3 LE only x 2 mins (pt wanted to do bike) Bike full revolutions, no resistance, x 2 mins LAQ 3x10 Seated heel slides using Lt leg to pull back 5" hold x10 SLR 3x10 Supine hip adduction ball squeeze 5" hold 2x10 Supine clamshell GTB 3x10 Supine marching GTB 2x10 BIL Seated marching 2x10 BIL Heel slides with strap 5" hold 2x10 Manual Therapy: Manual flexion/extension stretch AP mobs to improve flexion/extension Patellar mobs all directions Modalities: Vasopneumatic (Game Ready)   Location:  left knee Time:  10 minutes Pressure:  low Temperature:  34 degrees  [facility] Adult PT Treatment:                                                DATE: 12/24/2021 Therapeutic Exercise: LAQ 3x10 Seated heel slides using Lt leg to pull back 5" hold x5 SLR 2x10 Supine hip adduction ball squeeze 5" hold 2x10 Supine clamshell GTB 3x10 Supine marching GTB 2x10 BIL Seated marching 2x10 BIL Heel slides with strap 5" hold x10 Manual Therapy: Manual flexion/extension stretch AP mobs to improve flexion/extension Patellar mobs all directions Therapeutic Activity: Gait with SPC x185' (switched cane to opposite hand, gait became less antalgic) Modalities: Modalities: Vasopneumatic (Game Ready)   Location:  left knee Time:  10 minutes Pressure:  low Temperature:  34 degrees  12/06/2021: Demonstrated and issued HEP     PATIENT EDUCATION:   Education details: Pt educated on prognosis, POC, FOTO, and HEP Person educated: Patient Education method: EConsulting civil engineer DMedia planner and Handouts Education comprehension: verbalized understanding and returned demonstration     HOME EXERCISE PROGRAM: Access Code: [email or web address]: [email or web address] Date: 12/06/2021 Prepared by: TVanessa [place]  Exercises - Straight Leg Raise  - 2 x daily - 7 x weekly - 3 sets - 10 reps - 3-sec hold - Heel slide with strap, LAYING ON BACK  - 2 x daily - 7 x weekly - 3 sets - 10 reps - 5-sec hold - Seated Hamstring Stretch  -  2 x daily - 7 x weekly - 2 sets - 1-min hold - Step Up  - 2 x daily - 7 x weekly - 3 sets - 20 reps - 4 Way Patellar Glide  - 2 x daily - 7 x weekly - 2 sets - 10 reps   ASSESSMENT:   CLINICAL IMPRESSION: Patient presents to PT with high levels of pain in her Lt knee today and reports HEP compliance. While utilizing the Nustep the patient inquired about using the bike instead and switched to the bike with patient able to get full revolutions, though this did increase her pain. Session today focused on Lt knee strengthening and ROM. PROM flexion of 110 achieved today. Patient was able to tolerate all prescribed exercises with no adverse effects. Patient continues to benefit from skilled PT services and should be progressed as able to improve functional independence.     OBJECTIVE IMPAIRMENTS Abnormal gait, decreased balance, decreased coordination, decreased endurance, decreased knowledge of use of DME, decreased mobility, difficulty walking, decreased ROM, decreased strength, hypomobility, increased edema, impaired flexibility, improper body mechanics, postural dysfunction, and pain.    ACTIVITY LIMITATIONS carrying, lifting, bending, sitting, standing, squatting, sleeping, stairs, transfers, bed mobility, toileting, dressing, and locomotion level   PARTICIPATION LIMITATIONS: cleaning, laundry, interpersonal  relationship, driving, shopping, community activity, occupation, and yard work   PERSONAL FACTORS 3+ comorbidities: See medical hx  are also affecting patient's functional outcome.    REHAB POTENTIAL: Good   CLINICAL DECISION MAKING: Stable/uncomplicated   EVALUATION COMPLEXITY: Low     GOALS: Goals reviewed with patient? Yes   SHORT TERM GOALS: Target date: 01/03/2022  Pt will report understanding and adherence to initial HEP in order to promote independence in the management of primary impairments. Baseline: HEP provided at eval Goal status: INITIAL   2.  Pt will achieve Lt knee AROM of 0-100 degrees in order to progress to future stage of post-op protocol. Baseline: -9-80 Goal status: INITIAL   3.  Pt will achieve 10 SLR with no quad lag in order to progress to future stage of post-op protocol. Baseline: 10 degrees of quad lag with 1 rep Goal status: INITIAL     LONG TERM GOALS: Target date: 01/31/2022    Pt will achieve FOTO score of 61% in order to demonstrate improved functional ability as it relates to her primary knee impairments. Baseline: 38% Goal status: INITIAL   2.  Pt will achieve Lt knee AROM of 0-120 degrees in order to establish a normal gait pattern for community ambulation. Baseline: -9-80 Goal status: INITIAL   3.  Pt will achieve global BIL LE strength of 4+/5 or greater in order to progress her independent LE strengthening regimen with less limitation. Baseline: See MMT chart Goal status: INITIAL   4.  Pt will report ability to walk > 20 minutes without AD in order to grocery shop with less limitation. Baseline: 5 minutes with FWW Goal status: INITIAL   5.  Pt will achieve a 5xSTS in 14 seconds or less without UE support in order to demonstrate safe transfers. Baseline: 29 seconds with UE support seconds with UE support Goal status: INITIAL       PLAN: PT FREQUENCY: 2x/week   PT DURATION: 8 weeks   PLANNED INTERVENTIONS: Therapeutic  exercises, Therapeutic activity, Neuromuscular re-education, Balance training, Gait training, Patient/Family education, Self Care, Joint mobilization, Stair training, Orthotic/Fit training, DME instructions, Aquatic Therapy, Dry Needling, Electrical stimulation, Cryotherapy, Taping, Vasopneumatic device, Biofeedback, Ionotophoresis '4mg'$ /ml Dexamethasone, Manual  therapy, and Re-evaluation   PLAN FOR NEXT SESSION: Progress early knee strengthening/ ROM in accordance with post-op protocol    Margarette Canada, PTA 12/26/2021, 5:39 PM

## 2021-12-31 ENCOUNTER — Ambulatory Visit: Payer: No Typology Code available for payment source

## 2021-12-31 ENCOUNTER — Ambulatory Visit (INDEPENDENT_AMBULATORY_CARE_PROVIDER_SITE_OTHER): Payer: Self-pay

## 2021-12-31 ENCOUNTER — Encounter: Payer: Self-pay | Admitting: Orthopaedic Surgery

## 2021-12-31 ENCOUNTER — Ambulatory Visit (INDEPENDENT_AMBULATORY_CARE_PROVIDER_SITE_OTHER): Payer: Self-pay | Admitting: Orthopaedic Surgery

## 2021-12-31 DIAGNOSIS — Z96652 Presence of left artificial knee joint: Secondary | ICD-10-CM

## 2021-12-31 DIAGNOSIS — R262 Difficulty in walking, not elsewhere classified: Secondary | ICD-10-CM

## 2021-12-31 DIAGNOSIS — M6281 Muscle weakness (generalized): Secondary | ICD-10-CM

## 2021-12-31 DIAGNOSIS — R600 Localized edema: Secondary | ICD-10-CM

## 2021-12-31 DIAGNOSIS — G8929 Other chronic pain: Secondary | ICD-10-CM

## 2021-12-31 NOTE — Therapy (Signed)
OUTPATIENT PHYSICAL THERAPY TREATMENT NOTE   Patient Name: [name] [name] MRN: [id number] DOB:[birth year], 66 y.o., female Today's Date: 12/31/2021  PCP: Sandrea Hughs, NP REFERRING PROVIDER: Aundra Dubin, PA-C  END OF SESSION:   PT End of Session - 12/31/21 1745     Visit Number 4    Number of Visits 17    Date for PT Re-Evaluation 02/07/22    Authorization Type None    Authorization Time Period FOTO v6, v10    PT Start Time 1745    PT Stop Time 1832    PT Time Calculation (min) 47 min    Activity Tolerance Patient tolerated treatment well    Behavior During Therapy WFL for tasks assessed/performed               Past Medical History:  Diagnosis Date   Arthritis    Breast cancer ([place])    Cancer ([place])    breast   Diabetes mellitus without complication ([place])    History of MRI    Knee, Hip, and Back.   Hypertension    Personal history of chemotherapy    Personal history of radiation therapy    Past Surgical History:  Procedure Laterality Date   BREAST BIOPSY Right 02/03/2020   x2   BREAST BIOPSY Right 02/06/2020   x2   BREAST LUMPECTOMY Right 07/26/2020   BREAST LUMPECTOMY WITH RADIOACTIVE SEED AND SENTINEL LYMPH NODE BIOPSY Right 07/26/2020   Procedure: RIGHT BREAST LUMPECTOMY WITH RADIOACTIVE SEED X 2 AND SENTINEL LYMPH NODE BIOPSY;  Surgeon: Erroll Luna, MD;  Location: [facility];  Service: General;  Laterality: Right;   IR IMAGING GUIDED PORT INSERTION  02/23/2020   PORT-A-CATH REMOVAL N/A 07/26/2020   Procedure: REMOVAL PORT-A-CATH;  Surgeon: Erroll Luna, MD;  Location: [facility];  Service: General;  Laterality: N/A;   TOTAL KNEE ARTHROPLASTY Left 11/18/2021   Procedure: LEFT TOTAL KNEE ARTHROPLASTY;  Surgeon: Leandrew Koyanagi, MD;  Location: [facility];  Service: Orthopedics;  Laterality: Left;   Patient Active Problem List   Diagnosis Date Noted   Status post total left knee replacement 11/18/2021   Primary  osteoarthritis of left knee 08/06/2021   Aortic atherosclerosis ([place]) 04/24/2020   Deep venous thrombosis of left upper extremity ([place]) 04/24/2020   Port-A-Cath in place 03/26/2020   Malignant neoplasm of upper-outer quadrant of right breast in female, estrogen receptor positive ([place]) 02/13/2020    REFERRING DIAG: L45.625 (ICD-10-CM) - S/P total knee arthroplasty, left  THERAPY DIAG:  Chronic pain of left knee  Localized edema  Difficulty in walking, not elsewhere classified  Muscle weakness (generalized)  Rationale for Evaluation and Treatment Rehabilitation  PERTINENT HISTORY: Lt TKA on 11/18/2021 with Dr. Frankey Shown, DMII, HTN, Hx of breast cx with chemo/ radiation/ surgery (in remission)  PRECAUTIONS: Knee and Fall  ONSET DATE: 11/18/2021  SUBJECTIVE: Patient reports stiffness and pain in her Lt knee today and reports the Rt knee has been "acting up."  PAIN:  Are you having pain? Yes: NPRS scale: 8/10 Pain location: Lt knee Pain description: Achy, sharp Aggravating factors: walking >5 minutes with FWW, standing after prolonged sitting, and bending knee Relieving factors: ice, elevation, and pain medication   OBJECTIVE: (objective measures completed at initial evaluation unless otherwise dated)   DIAGNOSTIC FINDINGS: N/A   PATIENT SURVEYS:  FOTO 38%, predicted 61% in 13 visits   COGNITION:           Overall cognitive status: Within functional  limits for tasks assessed                          SENSATION: Light touch: Lt L2-S2 dermatomes impaired vs Rt   EDEMA:  Circumferential: 46.5cm on Rt, 52cm on Lt     POSTURE: flexed trunk  and weight shift right   PALPATION: TTP along Lt surgical incision   LOWER EXTREMITY ROM:   A/PROM Right eval Left eval 12/24/21 Left 12/26/21 Left 12/31/21 Left  Knee flexion 110 80p!,90p! 100/109 p! 105/110 p! 106/111p!  Knee extension 2 -9p!, -5p! -3     (Blank rows = not tested)   LOWER EXTREMITY MMT:   MMT Right eval  Left eval  Hip flexion 4/5 3/5  Hip extension 3/5 3/5  Hip abduction 3+/5 3/5  Knee flexion 5/5 3/p!  Knee extension 3+/5p! 2+/5p!   (Blank rows = not tested)     FUNCTIONAL TESTS:  5xSTS: 29 seconds SLR: roughly 10 degrees of quad lag throughout motion   GAIT: Distance walked: 93f Assistive device utilized: WEnvironmental consultant- 2 wheeled Level of assistance: Modified independence Comments: Decreased gait speed, Lt antalgic gait with forward posture       TODAY'S TREATMENT: [facility] Adult PT Treatment:                                                DATE: 12/31/2021 Therapeutic Exercise: Bike full revolutions, no resistance, x 5 mins Step ups 4" Lt leading forward/lateral x10 each LAQ 3x10 Seated heel slides using Rt leg to pull back 5" hold x10 SLR 3x10 Supine hip adduction ball squeeze 5" hold 2x10 Supine clamshell GTB 3x10 Supine marching GTB 2x10 BIL Seated marching 2x10 BIL Heel slides with strap 5" hold 2x10 Manual Therapy: Manual flexion/extension stretch AP mobs to improve flexion/extension Patellar mobs all directions Modalities: Vasopneumatic (Game Ready)   Location:  left knee Time:  10 minutes Pressure:  low Temperature:  34 degrees  [facility] Adult PT Treatment:                                                DATE: 12/26/21 Therapeutic Exercise: Nustep level 3 LE only x 2 mins (pt wanted to do bike) Bike full revolutions, no resistance, x 2 mins LAQ 3x10 Seated heel slides using Lt leg to pull back 5" hold x10 SLR 3x10 Supine hip adduction ball squeeze 5" hold 2x10 Supine clamshell GTB 3x10 Supine marching GTB 2x10 BIL Seated marching 2x10 BIL Heel slides with strap 5" hold 2x10 Manual Therapy: Manual flexion/extension stretch AP mobs to improve flexion/extension Patellar mobs all directions Modalities: Vasopneumatic (Game Ready)   Location:  left knee Time:  10 minutes Pressure:  low Temperature:  34 degrees  [facility] Adult PT Treatment:                                                 DATE: 12/24/2021 Therapeutic Exercise: LAQ 3x10 Seated heel slides using Lt leg to pull back 5" hold x5 SLR 2x10 Supine hip adduction ball squeeze 5" hold 2x10  Supine clamshell GTB 3x10 Supine marching GTB 2x10 BIL Seated marching 2x10 BIL Heel slides with strap 5" hold x10 Manual Therapy: Manual flexion/extension stretch AP mobs to improve flexion/extension Patellar mobs all directions Therapeutic Activity: Gait with SPC x185' (switched cane to opposite hand, gait became less antalgic) Modalities: Modalities: Vasopneumatic (Game Ready)   Location:  left knee Time:  10 minutes Pressure:  low Temperature:  34 degrees     PATIENT EDUCATION:  Education details: Pt educated on prognosis, POC, FOTO, and HEP Person educated: Patient Education method: Consulting civil engineer, Media planner, and Handouts Education comprehension: verbalized understanding and returned demonstration     HOME EXERCISE PROGRAM: Access Code: [email or web address] URL: [email or web address] Date: 12/06/2021 Prepared by: Vanessa [place]   Exercises - Straight Leg Raise  - 2 x daily - 7 x weekly - 3 sets - 10 reps - 3-sec hold - Heel slide with strap, LAYING ON BACK  - 2 x daily - 7 x weekly - 3 sets - 10 reps - 5-sec hold - Seated Hamstring Stretch  - 2 x daily - 7 x weekly - 2 sets - 1-min hold - Step Up  - 2 x daily - 7 x weekly - 3 sets - 20 reps - 4 Way Patellar Glide  - 2 x daily - 7 x weekly - 2 sets - 10 reps   ASSESSMENT:   CLINICAL IMPRESSION: Patient presents to PT with high levels of pain and stiffness in the Lt knee and reports the Rt knee has been giving her trouble lately as well. Introduced step ups today with no increase in pain. Session today focused on left knee strengthening and ROM with patient achieve 111 flexion PROM. Patient was able to tolerate all prescribed exercises with some limitation from pain, but she has good participation throughout session. Patient  continues to benefit from skilled PT services and should be progressed as able to improve functional independence.    OBJECTIVE IMPAIRMENTS Abnormal gait, decreased balance, decreased coordination, decreased endurance, decreased knowledge of use of DME, decreased mobility, difficulty walking, decreased ROM, decreased strength, hypomobility, increased edema, impaired flexibility, improper body mechanics, postural dysfunction, and pain.    ACTIVITY LIMITATIONS carrying, lifting, bending, sitting, standing, squatting, sleeping, stairs, transfers, bed mobility, toileting, dressing, and locomotion level   PARTICIPATION LIMITATIONS: cleaning, laundry, interpersonal relationship, driving, shopping, community activity, occupation, and yard work   PERSONAL FACTORS 3+ comorbidities: See medical hx  are also affecting patient's functional outcome.    REHAB POTENTIAL: Good   CLINICAL DECISION MAKING: Stable/uncomplicated   EVALUATION COMPLEXITY: Low     GOALS: Goals reviewed with patient? Yes   SHORT TERM GOALS: Target date: 01/03/2022  Pt will report understanding and adherence to initial HEP in order to promote independence in the management of primary impairments. Baseline: HEP provided at eval Goal status: MET Pt reports adherence 12/31/21   2.  Pt will achieve Lt knee AROM of 0-100 degrees in order to progress to future stage of post-op protocol. Baseline: -9-80 Goal status: INITIAL   3.  Pt will achieve 10 SLR with no quad lag in order to progress to future stage of post-op protocol. Baseline: 10 degrees of quad lag with 1 rep Goal status: INITIAL     LONG TERM GOALS: Target date: 01/31/2022    Pt will achieve FOTO score of 61% in order to demonstrate improved functional ability as it relates to her primary knee impairments. Baseline: 38% Goal status: INITIAL   2.  Pt will achieve Lt knee  AROM of 0-120 degrees in order to establish a normal gait pattern for community  ambulation. Baseline: -9-80 Goal status: INITIAL   3.  Pt will achieve global BIL LE strength of 4+/5 or greater in order to progress her independent LE strengthening regimen with less limitation. Baseline: See MMT chart Goal status: INITIAL   4.  Pt will report ability to walk > 20 minutes without AD in order to grocery shop with less limitation. Baseline: 5 minutes with FWW Goal status: INITIAL   5.  Pt will achieve a 5xSTS in 14 seconds or less without UE support in order to demonstrate safe transfers. Baseline: 29 seconds with UE support seconds with UE support Goal status: INITIAL       PLAN: PT FREQUENCY: 2x/week   PT DURATION: 8 weeks   PLANNED INTERVENTIONS: Therapeutic exercises, Therapeutic activity, Neuromuscular re-education, Balance training, Gait training, Patient/Family education, Self Care, Joint mobilization, Stair training, Orthotic/Fit training, DME instructions, Aquatic Therapy, Dry Needling, Electrical stimulation, Cryotherapy, Taping, Vasopneumatic device, Biofeedback, Ionotophoresis 19m/ml Dexamethasone, Manual therapy, and Re-evaluation   PLAN FOR NEXT SESSION: Progress early knee strengthening/ ROM in accordance with post-op protocol    SMargarette Canada PTA 12/31/2021, 6:23 PM

## 2021-12-31 NOTE — Progress Notes (Signed)
Post-Op Visit Note   [name]           Date of Birth: 03-19-1956           MRN: [id number] Visit Date: 12/31/2021 PCP: Sandrea Hughs, NP   Assessment & Plan:  Chief Complaint:  Chief Complaint  Patient presents with   Left Knee - Follow-up    Left total knee arthroplasty 11/18/2021   Visit Diagnoses:  1. Status post total left knee replacement   2. S/P total knee arthroplasty, left     Plan: Patient is 6 weeks status post left total knee replacement on 11/18/2021.  Overall doing well.  Doing physical therapy twice a week.  Making excellent progress.  Examination of the left knee shows a fully healed surgical scar.  Expected postoperative changes.  Range of motion is 3 to 110 degrees.  Stable to varus valgus.  X-rays demonstrate stable implant without complications.  I am happy with Ms. Davids progress and recovery.  She will continue with outpatient PT.  Dental prophylaxis reinforced.  Recheck in 6 weeks.  Follow-Up Instructions: Return in about 6 weeks (around 02/11/2022).   Orders:  Orders Placed This Encounter  Procedures   XR Knee 1-2 Views Left   No orders of the defined types were placed in this encounter.   Imaging: XR Knee 1-2 Views Left  Result Date: 12/31/2021 Stable total knee replacement in good alignment.    PMFS History: Patient Active Problem List   Diagnosis Date Noted   Status post total left knee replacement 11/18/2021   Primary osteoarthritis of left knee 08/06/2021   Aortic atherosclerosis ([place]) 04/24/2020   Deep venous thrombosis of left upper extremity ([place]) 04/24/2020   Port-A-Cath in place 03/26/2020   Malignant neoplasm of upper-outer quadrant of right breast in female, estrogen receptor positive ([place]) 02/13/2020   Past Medical History:  Diagnosis Date   Arthritis    Breast cancer ([place])    Cancer ([place])    breast   Diabetes mellitus without complication ([place])    History of MRI    Knee, Hip, and Back.   Hypertension     Personal history of chemotherapy    Personal history of radiation therapy     Family History  Problem Relation Age of Onset   Asthma Mother    Arthritis Sister    Asthma Brother    Arthritis Brother     Past Surgical History:  Procedure Laterality Date   BREAST BIOPSY Right 02/03/2020   x2   BREAST BIOPSY Right 02/06/2020   x2   BREAST LUMPECTOMY Right 07/26/2020   BREAST LUMPECTOMY WITH RADIOACTIVE SEED AND SENTINEL LYMPH NODE BIOPSY Right 07/26/2020   Procedure: RIGHT BREAST LUMPECTOMY WITH RADIOACTIVE SEED X 2 AND SENTINEL LYMPH NODE BIOPSY;  Surgeon: Erroll Luna, MD;  Location: [facility];  Service: General;  Laterality: Right;   IR IMAGING GUIDED PORT INSERTION  02/23/2020   PORT-A-CATH REMOVAL N/A 07/26/2020   Procedure: REMOVAL PORT-A-CATH;  Surgeon: Erroll Luna, MD;  Location: [facility];  Service: General;  Laterality: N/A;   TOTAL KNEE ARTHROPLASTY Left 11/18/2021   Procedure: LEFT TOTAL KNEE ARTHROPLASTY;  Surgeon: Leandrew Koyanagi, MD;  Location: [facility];  Service: Orthopedics;  Laterality: Left;   Social History   Occupational History   Not on file  Tobacco Use   Smoking status: Never   Smokeless tobacco: Never  Vaping Use   Vaping Use: Never used  Substance and  Sexual Activity   Alcohol use: Yes    Alcohol/week: 1.0 standard drink of alcohol    Types: 1 Glasses of wine per week    Comment: occasionally   Drug use: Never   Sexual activity: Not Currently

## 2022-01-02 ENCOUNTER — Ambulatory Visit: Payer: No Typology Code available for payment source

## 2022-01-02 DIAGNOSIS — R600 Localized edema: Secondary | ICD-10-CM

## 2022-01-02 DIAGNOSIS — G8929 Other chronic pain: Secondary | ICD-10-CM

## 2022-01-02 DIAGNOSIS — M6281 Muscle weakness (generalized): Secondary | ICD-10-CM

## 2022-01-02 DIAGNOSIS — R262 Difficulty in walking, not elsewhere classified: Secondary | ICD-10-CM

## 2022-01-02 NOTE — Therapy (Signed)
OUTPATIENT PHYSICAL THERAPY TREATMENT NOTE   Patient Name: [name] [name] MRN: [id number] DOB:[birth year], 66 y.o., female Today's Date: 01/02/2022  PCP: Sandrea Hughs, NP REFERRING PROVIDER: Aundra Dubin, PA-C  END OF SESSION:   PT End of Session - 01/02/22 1521     Visit Number 5    Number of Visits 17    Date for PT Re-Evaluation 02/07/22    Authorization Type None    Authorization Time Period FOTO v6, v10    PT Start Time 1525    PT Stop Time 1615   10 minutes vasopneumatic treatment   PT Time Calculation (min) 50 min    Activity Tolerance Patient tolerated treatment well    Behavior During Therapy WFL for tasks assessed/performed                Past Medical History:  Diagnosis Date   Arthritis    Breast cancer ([place])    Cancer ([place])    breast   Diabetes mellitus without complication ([place])    History of MRI    Knee, Hip, and Back.   Hypertension    Personal history of chemotherapy    Personal history of radiation therapy    Past Surgical History:  Procedure Laterality Date   BREAST BIOPSY Right 02/03/2020   x2   BREAST BIOPSY Right 02/06/2020   x2   BREAST LUMPECTOMY Right 07/26/2020   BREAST LUMPECTOMY WITH RADIOACTIVE SEED AND SENTINEL LYMPH NODE BIOPSY Right 07/26/2020   Procedure: RIGHT BREAST LUMPECTOMY WITH RADIOACTIVE SEED X 2 AND SENTINEL LYMPH NODE BIOPSY;  Surgeon: Erroll Luna, MD;  Location: [facility];  Service: General;  Laterality: Right;   IR IMAGING GUIDED PORT INSERTION  02/23/2020   PORT-A-CATH REMOVAL N/A 07/26/2020   Procedure: REMOVAL PORT-A-CATH;  Surgeon: Erroll Luna, MD;  Location: [facility];  Service: General;  Laterality: N/A;   TOTAL KNEE ARTHROPLASTY Left 11/18/2021   Procedure: LEFT TOTAL KNEE ARTHROPLASTY;  Surgeon: Leandrew Koyanagi, MD;  Location: [facility];  Service: Orthopedics;  Laterality: Left;   Patient Active Problem List   Diagnosis Date Noted   Status post total left knee  replacement 11/18/2021   Primary osteoarthritis of left knee 08/06/2021   Aortic atherosclerosis ([place]) 04/24/2020   Deep venous thrombosis of left upper extremity ([place]) 04/24/2020   Port-A-Cath in place 03/26/2020   Malignant neoplasm of upper-outer quadrant of right breast in female, estrogen receptor positive ([place]) 02/13/2020    REFERRING DIAG: Z60.109 (ICD-10-CM) - S/P total knee arthroplasty, left  THERAPY DIAG:  Chronic pain of left knee  Localized edema  Difficulty in walking, not elsewhere classified  Muscle weakness (generalized)  Rationale for Evaluation and Treatment Rehabilitation  PERTINENT HISTORY: Lt TKA on 11/18/2021 with Dr. Frankey Shown, DMII, HTN, Hx of breast cx with chemo/ radiation/ surgery (in remission)  PRECAUTIONS: Knee and Fall  ONSET DATE: 11/18/2021  SUBJECTIVE: Pt reports that her Rt knee pain has been worse the past few days, rating the pain as 9/10. She reports only 5/10 Lt knee pain and stiffness today. She denies any MOI related to her Rt knee. She reports doing her HEP twice per day.   PAIN:  Are you having pain? Yes: NPRS scale: 8/10 Pain location: Lt knee Pain description: Achy, sharp Aggravating factors: walking >5 minutes with FWW, standing after prolonged sitting, and bending knee Relieving factors: ice, elevation, and pain medication   OBJECTIVE: (objective measures completed at initial evaluation unless otherwise dated)  DIAGNOSTIC FINDINGS: N/A   PATIENT SURVEYS:  FOTO 38%, predicted 61% in 13 visits   COGNITION:           Overall cognitive status: Within functional limits for tasks assessed                          SENSATION: Light touch: Lt L2-S2 dermatomes impaired vs Rt   EDEMA:  Circumferential: 46.5cm on Rt, 52cm on Lt     POSTURE: flexed trunk  and weight shift right   PALPATION: TTP along Lt surgical incision   LOWER EXTREMITY ROM:   A/PROM Right eval Left eval 12/24/21 Left 12/26/21 Left 12/31/21 Left  01/02/2022 Left  Knee flexion 110 80p!,90p! 100/109 p! 105/110 p! 106/111p! 107, 115p! PROM following MET  Knee extension 2 -9p!, -5p! -3   -4   (Blank rows = not tested)   LOWER EXTREMITY MMT:   MMT Right eval Left eval  Hip flexion 4/5 3/5  Hip extension 3/5 3/5  Hip abduction 3+/5 3/5  Knee flexion 5/5 3/p!  Knee extension 3+/5p! 2+/5p!   (Blank rows = not tested)     FUNCTIONAL TESTS:  5xSTS: 29 seconds SLR: roughly 10 degrees of quad lag throughout motion   GAIT: Distance walked: 58f Assistive device utilized: WEnvironmental consultant- 2 wheeled Level of assistance: Modified independence Comments: Decreased gait speed, Lt antalgic gait with forward posture       TODAY'S TREATMENT:  [facility] Adult PT Treatment:                                                DATE: 01/02/2022 Therapeutic Exercise: Supine Lt knee flexion AAROM with green strap x10 with 5-sec hold Lt SLR with hip abduction 3x10 Mini squat with UE support 3x8 Sidelying hip abduction with GTB around thighs 2x10 with 3-sec hold BIL Supine bicycle kicks 2x10 Bridge with adduction ball squeeze 2x10 with 3-sec hold Seated active hamstring stretch x23m BIL Manual Therapy: Lt knee flexion contract/ relax MET x3 with 30-sec hold at end range Neuromuscular re-ed: N/A Therapeutic Activity: N/A Modalities: Vasopneumatic (Game Ready)   Location:  left knee Time:  10 minutes Pressure:  low Temperature:  34 degrees Self Care: N/A   [facility] Adult PT Treatment:                                                DATE: 12/31/2021 Therapeutic Exercise: Bike full revolutions, no resistance, x 5 mins Step ups 4" Lt leading forward/lateral x10 each LAQ 3x10 Seated heel slides using Rt leg to pull back 5" hold x10 SLR 3x10 Supine hip adduction ball squeeze 5" hold 2x10 Supine clamshell GTB 3x10 Supine marching GTB 2x10 BIL Seated marching 2x10 BIL Heel slides with strap 5" hold 2x10 Manual Therapy: Manual flexion/extension  stretch AP mobs to improve flexion/extension Patellar mobs all directions Modalities: Vasopneumatic (Game Ready)   Location:  left knee Time:  10 minutes Pressure:  low Temperature:  34 degrees  [facility] Adult PT Treatment:  DATE: 12/26/21 Therapeutic Exercise: Nustep level 3 LE only x 2 mins (pt wanted to do bike) Bike full revolutions, no resistance, x 2 mins LAQ 3x10 Seated heel slides using Lt leg to pull back 5" hold x10 SLR 3x10 Supine hip adduction ball squeeze 5" hold 2x10 Supine clamshell GTB 3x10 Supine marching GTB 2x10 BIL Seated marching 2x10 BIL Heel slides with strap 5" hold 2x10 Manual Therapy: Manual flexion/extension stretch AP mobs to improve flexion/extension Patellar mobs all directions Modalities: Vasopneumatic (Game Ready)   Location:  left knee Time:  10 minutes Pressure:  low Temperature:  34 degrees      PATIENT EDUCATION:  Education details: Pt educated on prognosis, POC, FOTO, and HEP Person educated: Patient Education method: Consulting civil engineer, Media planner, and Handouts Education comprehension: verbalized understanding and returned demonstration     HOME EXERCISE PROGRAM: Access Code: [email or web address] URL: [email or web address] Date: 12/06/2021 Prepared by: Vanessa Charlotte   Exercises - Straight Leg Raise  - 2 x daily - 7 x weekly - 3 sets - 10 reps - 3-sec hold - Heel slide with strap, LAYING ON BACK  - 2 x daily - 7 x weekly - 3 sets - 10 reps - 5-sec hold - Seated Hamstring Stretch  - 2 x daily - 7 x weekly - 2 sets - 1-min hold - Step Up  - 2 x daily - 7 x weekly - 3 sets - 20 reps - 4 Way Patellar Glide  - 2 x daily - 7 x weekly - 2 sets - 10 reps   ASSESSMENT:   CLINICAL IMPRESSION: Pt continues to be limited by pain, with increased RT pain today. Her Lt knee ROM continues to be limited, although contract/ relax MET led to improved knee flexion range. Pt will benefit from  continued skilled PT to address her primary impairments and return to her prior level of function with less limitation.   OBJECTIVE IMPAIRMENTS Abnormal gait, decreased balance, decreased coordination, decreased endurance, decreased knowledge of use of DME, decreased mobility, difficulty walking, decreased ROM, decreased strength, hypomobility, increased edema, impaired flexibility, improper body mechanics, postural dysfunction, and pain.    ACTIVITY LIMITATIONS carrying, lifting, bending, sitting, standing, squatting, sleeping, stairs, transfers, bed mobility, toileting, dressing, and locomotion level   PARTICIPATION LIMITATIONS: cleaning, laundry, interpersonal relationship, driving, shopping, community activity, occupation, and yard work   PERSONAL FACTORS 3+ comorbidities: See medical hx  are also affecting patient's functional outcome.        GOALS: Goals reviewed with patient? Yes   SHORT TERM GOALS: Target date: 01/03/2022  Pt will report understanding and adherence to initial HEP in order to promote independence in the management of primary impairments. Baseline: HEP provided at eval Goal status: MET Pt reports adherence 12/31/21   2.  Pt will achieve Lt knee AROM of 0-100 degrees in order to progress to future stage of post-op protocol. Baseline: -9-80 Goal status: INITIAL   3.  Pt will achieve 10 SLR with no quad lag in order to progress to future stage of post-op protocol. Baseline: 10 degrees of quad lag with 1 rep Goal status: INITIAL     LONG TERM GOALS: Target date: 01/31/2022    Pt will achieve FOTO score of 61% in order to demonstrate improved functional ability as it relates to her primary knee impairments. Baseline: 38% Goal status: INITIAL   2.  Pt will achieve Lt knee AROM of 0-120 degrees in order to establish a normal gait pattern for community ambulation. Baseline: -9-80 Goal  status: INITIAL   3.  Pt will achieve global BIL LE strength of 4+/5 or greater  in order to progress her independent LE strengthening regimen with less limitation. Baseline: See MMT chart Goal status: INITIAL   4.  Pt will report ability to walk > 20 minutes without AD in order to grocery shop with less limitation. Baseline: 5 minutes with FWW Goal status: INITIAL   5.  Pt will achieve a 5xSTS in 14 seconds or less without UE support in order to demonstrate safe transfers. Baseline: 29 seconds with UE support seconds with UE support Goal status: INITIAL       PLAN: PT FREQUENCY: 2x/week   PT DURATION: 8 weeks   PLANNED INTERVENTIONS: Therapeutic exercises, Therapeutic activity, Neuromuscular re-education, Balance training, Gait training, Patient/Family education, Self Care, Joint mobilization, Stair training, Orthotic/Fit training, DME instructions, Aquatic Therapy, Dry Needling, Electrical stimulation, Cryotherapy, Taping, Vasopneumatic device, Biofeedback, Ionotophoresis 46m/ml Dexamethasone, Manual therapy, and Re-evaluation   PLAN FOR NEXT SESSION: Progress early knee strengthening/ ROM in accordance with post-op protocol   YVanessa Granbury PT, DPT 01/02/22 4:22 PM

## 2022-01-04 ENCOUNTER — Ambulatory Visit
Admission: EM | Admit: 2022-01-04 | Discharge: 2022-01-04 | Disposition: A | Discharge status: Home / Self Care | Payer: No Typology Code available for payment source | Attending: Physician Assistant | Admitting: Physician Assistant

## 2022-01-04 ENCOUNTER — Encounter (HOSPITAL_COMMUNITY): Payer: Self-pay

## 2022-01-04 ENCOUNTER — Emergency Department (HOSPITAL_COMMUNITY)
Admission: EM | Admit: 2022-01-04 | Discharge: 2022-01-04 | Disposition: A | Discharge status: Home / Self Care | Payer: No Typology Code available for payment source | Attending: Emergency Medicine | Admitting: Emergency Medicine

## 2022-01-04 ENCOUNTER — Encounter: Payer: Self-pay | Admitting: Emergency Medicine

## 2022-01-04 ENCOUNTER — Emergency Department (HOSPITAL_COMMUNITY): Payer: No Typology Code available for payment source

## 2022-01-04 DIAGNOSIS — Z96652 Presence of left artificial knee joint: Secondary | ICD-10-CM

## 2022-01-04 DIAGNOSIS — G8929 Other chronic pain: Secondary | ICD-10-CM | POA: Insufficient documentation

## 2022-01-04 DIAGNOSIS — E119 Type 2 diabetes mellitus without complications: Secondary | ICD-10-CM | POA: Insufficient documentation

## 2022-01-04 DIAGNOSIS — M25561 Pain in right knee: Secondary | ICD-10-CM | POA: Insufficient documentation

## 2022-01-04 DIAGNOSIS — Z7984 Long term (current) use of oral hypoglycemic drugs: Secondary | ICD-10-CM | POA: Insufficient documentation

## 2022-01-04 DIAGNOSIS — M25461 Effusion, right knee: Secondary | ICD-10-CM

## 2022-01-04 DIAGNOSIS — Z79899 Other long term (current) drug therapy: Secondary | ICD-10-CM | POA: Insufficient documentation

## 2022-01-04 DIAGNOSIS — M25462 Effusion, left knee: Secondary | ICD-10-CM

## 2022-01-04 DIAGNOSIS — I10 Essential (primary) hypertension: Secondary | ICD-10-CM | POA: Insufficient documentation

## 2022-01-04 LAB — CBC WITH DIFFERENTIAL/PLATELET
Abs Immature Granulocytes: 0.01 10*3/uL (ref 0.00–0.07)
Basophils Absolute: 0 10*3/uL (ref 0.0–0.1)
Basophils Relative: 1 %
Eosinophils Absolute: 0.1 10*3/uL (ref 0.0–0.5)
Eosinophils Relative: 2 %
HCT: 31.6 % — ABNORMAL LOW (ref 36.0–46.0)
Hemoglobin: 10.3 g/dL — ABNORMAL LOW (ref 12.0–15.0)
Immature Granulocytes: 0 %
Lymphocytes Relative: 37 %
Lymphs Abs: 1.4 10*3/uL (ref 0.7–4.0)
MCH: 34.3 pg — ABNORMAL HIGH (ref 26.0–34.0)
MCHC: 32.6 g/dL (ref 30.0–36.0)
MCV: 105.3 fL — ABNORMAL HIGH (ref 80.0–100.0)
Monocytes Absolute: 0.2 10*3/uL (ref 0.1–1.0)
Monocytes Relative: 5 %
Neutro Abs: 2.1 10*3/uL (ref 1.7–7.7)
Neutrophils Relative %: 55 %
Platelets: 219 10*3/uL (ref 150–400)
RBC: 3 MIL/uL — ABNORMAL LOW (ref 3.87–5.11)
RDW: 14.4 % (ref 11.5–15.5)
WBC: 3.7 10*3/uL — ABNORMAL LOW (ref 4.0–10.5)
nRBC: 0 % (ref 0.0–0.2)

## 2022-01-04 LAB — I-STAT CHEM 8, ED
BUN: 5 mg/dL — ABNORMAL LOW (ref 8–23)
Calcium, Ion: 1.23 mmol/L (ref 1.15–1.40)
Chloride: 103 mmol/L (ref 98–111)
Creatinine, Ser: 0.8 mg/dL (ref 0.44–1.00)
Glucose, Bld: 106 mg/dL — ABNORMAL HIGH (ref 70–99)
HCT: 31 % — ABNORMAL LOW (ref 36.0–46.0)
Hemoglobin: 10.5 g/dL — ABNORMAL LOW (ref 12.0–15.0)
Potassium: 4.3 mmol/L (ref 3.5–5.1)
Sodium: 135 mmol/L (ref 135–145)
TCO2: 20 mmol/L — ABNORMAL LOW (ref 22–32)

## 2022-01-04 MED ORDER — ACETAMINOPHEN 500 MG PO TABS
500.0000 mg | ORAL_TABLET | Freq: Once | ORAL | Status: AC
  Administered 2022-01-04: 500 mg via ORAL

## 2022-01-04 MED ORDER — OXYCODONE HCL 5 MG PO TABS: 5.0000 mg | ORAL_TABLET | ORAL | 0 refills | Status: DC | PRN

## 2022-01-04 MED ORDER — LIDOCAINE 5 % EX PTCH
1.0000 | MEDICATED_PATCH | CUTANEOUS | Status: DC
  Administered 2022-01-04: 1 via TRANSDERMAL
  Filled 2022-01-04: qty 1

## 2022-01-04 MED ORDER — OXYCODONE HCL 5 MG PO TABS
5.0000 mg | ORAL_TABLET | Freq: Once | ORAL | Status: AC
  Administered 2022-01-04: 5 mg via ORAL
  Filled 2022-01-04: qty 1

## 2022-01-04 NOTE — Discharge Instructions (Signed)
Please go to the emergency room for further evaluation and management.  I am concerned given how swollen her knees are and she recently had surgery that she needs further work-up than what we have in urgent care.

## 2022-01-04 NOTE — ED Provider Triage Note (Signed)
Emergency Medicine Provider Triage Evaluation Note  [name] [name] , a 66 y.o. female  was evaluated in triage.  Pt complains of right knee pain and swelling since yesterday. Denies any injury. Sent over from urgent care.  Review of Systems  Positive:  Negative:   Physical Exam  BP 134/88 (BP Location: Left Arm)   Pulse 82   Temp 98.7 F (37.1 C) (Oral)   Resp 18   SpO2 100%  Gen:   Awake, no distress   Resp:  Normal effort  MSK:   Moves extremities without difficulty  Other:  Significant swelling noted to knee with increae temperature or overlyin skin changes. Palpable pulses. Compartments are soft.  Medical Decision Making  Medically screening exam initiated at 1:35 PM.  Appropriate orders placed.  [name] [name] was informed that the remainder of the evaluation will be completed by another provider, this initial triage assessment does not replace that evaluation, and the importance of remaining in the ED until their evaluation is complete.  XR imaging ordered.    Sherrell Puller, [place] 01/04/22 1338

## 2022-01-04 NOTE — Discharge Instructions (Addendum)
You were seen in the ER for evaluation of your right knee pain. I likely think this is a flare up of your arthritis. I would like for you to STOP taking the Percocet. START taking the Roxicodone prescribed to you. You can take '1000mg'$  of Tylenol every 6 hours as needed. Please make sure you are taking your Meloxicam. Additionally, you can apply OTC lidocaine patches to your knee as well. Take the Roxicodone as needed for breakthrough pain. These can make you drowsy. I would like for you to follow up with your orthopedic practice in the upcoming week. If you have any fever, color changes, temperature changes, worsening pain, weakness, please return to the ER for re-evaluation.   Contact a health care provider if: You have pain that gets worse and does not get better with medicine. Your joint pain does not improve within 3 days. You have increased bruising or swelling. You have a fever. You lose 10 lb (4.5 kg) or more without trying. Get help right away if: You cannot move the joint. Your fingers or toes tingle, become numb, or turn cold and blue. You have a fever along with a joint that is red, warm, and swollen.

## 2022-01-04 NOTE — ED Notes (Signed)
Patient is being discharged from the Urgent Care and sent to the Emergency Department via POV . Per Verna Czech PA, patient is in need of higher level of care due to possible infection and swelling of the left knee . Patient is aware and verbalizes understanding of plan of care.  Vitals:   01/04/22 1203  BP: 125/76  Pulse: 73  Resp: 18  Temp: 98 F (36.7 C)  SpO2: 98%

## 2022-01-04 NOTE — ED Triage Notes (Signed)
Pt is present today with right knee pain. Pt sx started this morning. Pt states that she took tylenol around 6am; no relief

## 2022-01-04 NOTE — ED Provider Notes (Signed)
[facility]    CSN: [id number] Arrival date & time: 01/04/22  0948      History   Chief Complaint Chief Complaint  Patient presents with   Joint Swelling    HPI [name] [name] is a 66 y.o. female.   Patient presents today with a 1 day history of right knee pain and swelling.  She reports that pain is rated 10 on a 0-10 pain scale, localized to right knee, worse with palpation or movement, no alleviating factors identified.  She denies any fever, nausea, vomiting but is feeling poorly.  She does report that she had total left knee arthroplasty a couple of months ago.  She is having some pain and swelling in that knee but is much worse in the right.  She has known osteoarthritis but states this is much more severe than her typical pain.  She has been taking Tylenol without improvement of symptoms.     Past Medical History:  Diagnosis Date   Arthritis    Breast cancer ([place])    Cancer ([place])    breast   Diabetes mellitus without complication ([place])    History of MRI    Knee, Hip, and Back.   Hypertension    Personal history of chemotherapy    Personal history of radiation therapy     Patient Active Problem List   Diagnosis Date Noted   Status post total left knee replacement 11/18/2021   Primary osteoarthritis of left knee 08/06/2021   Aortic atherosclerosis ([place]) 04/24/2020   Deep venous thrombosis of left upper extremity ([place]) 04/24/2020   Port-A-Cath in place 03/26/2020   Malignant neoplasm of upper-outer quadrant of right breast in female, estrogen receptor positive ([place]) 02/13/2020    Past Surgical History:  Procedure Laterality Date   BREAST BIOPSY Right 02/03/2020   x2   BREAST BIOPSY Right 02/06/2020   x2   BREAST LUMPECTOMY Right 07/26/2020   BREAST LUMPECTOMY WITH RADIOACTIVE SEED AND SENTINEL LYMPH NODE BIOPSY Right 07/26/2020   Procedure: RIGHT BREAST LUMPECTOMY WITH RADIOACTIVE SEED X 2 AND SENTINEL LYMPH NODE BIOPSY;  Surgeon: Erroll Luna,  MD;  Location: [facility];  Service: General;  Laterality: Right;   IR IMAGING GUIDED PORT INSERTION  02/23/2020   PORT-A-CATH REMOVAL N/A 07/26/2020   Procedure: REMOVAL PORT-A-CATH;  Surgeon: Erroll Luna, MD;  Location: [facility];  Service: General;  Laterality: N/A;   TOTAL KNEE ARTHROPLASTY Left 11/18/2021   Procedure: LEFT TOTAL KNEE ARTHROPLASTY;  Surgeon: Leandrew Koyanagi, MD;  Location: [facility];  Service: Orthopedics;  Laterality: Left;    OB History   No obstetric history on file.      Home Medications    Prior to Admission medications   Medication Sig Start Date End Date Taking? Authorizing Provider  abemaciclib (VERZENIO) 100 MG tablet Take 1 tablet (100 mg total) by mouth 2 (two) times daily. 12/26/21   Benay Pike, MD  anastrozole (ARIMIDEX) 1 MG tablet Take 1 tablet (1 mg total) by mouth daily. 12/25/21   Benay Pike, MD  carvedilol (COREG) 6.25 MG tablet Take 6.25 mg by mouth 2 (two) times daily with a meal.    [provider]  cephALEXin (KEFLEX) 500 MG capsule Take 1 capsule (500 mg total) by mouth 4 (four) times daily. To be taken after surgery 11/13/21   Aundra Dubin, PA-C  docusate sodium (COLACE) 100 MG capsule Take 1 capsule (100 mg total) by mouth daily as needed. 11/13/21 11/13/22  Aundra Dubin, PA-C  Garlic 7412 MG CAPS Take 1,000 mg by mouth daily.    [provider]  lansoprazole (PREVACID) 30 MG capsule Take 30 mg by mouth every morning. 11/30/19   [provider]  loperamide (IMODIUM) 2 MG capsule Take 2 mg by mouth as needed for diarrhea or loose stools.    [provider]  Magnesium 100 MG CAPS Take 100 mg by mouth 2 (two) times daily.    [provider]  metFORMIN (GLUCOPHAGE) 1000 MG tablet Take 1 tablet (1,000 mg total) by mouth 2 (two) times daily with a meal. 09/24/21   Ngetich, Dinah C, NP  methocarbamol (ROBAXIN) 500 MG tablet Take 1 tablet (500 mg total) by mouth 2 (two)  times daily as needed for muscle spasms. 12/03/21   Aundra Dubin, PA-C  Multiple Vitamins-Minerals (CENTRUM SILVER 50+WOMEN PO) Take 1 capsule by mouth daily.    [provider]  ondansetron (ZOFRAN) 4 MG tablet Take 1 tablet (4 mg total) by mouth every 8 (eight) hours as needed for nausea or vomiting. 11/13/21   Aundra Dubin, PA-C  oxyCODONE-acetaminophen (PERCOCET) 5-325 MG tablet Take 1-2 tablets by mouth every 6 (six) hours as needed. 12/03/21   Aundra Dubin, PA-C  rivaroxaban (XARELTO) 10 MG TABS tablet Take 1 tablet (10 mg total) by mouth daily. Take as directed after surgery. 11/20/21     UNABLE TO FIND Med Name: [place]. Pt reports taking 1 scoopful in tea or water    [provider]  valsartan (DIOVAN) 320 MG tablet Take 320 mg by mouth daily.    [provider]  vitamin C (ASCORBIC ACID) 500 MG tablet Take 500 mg by mouth daily.    [provider]    Family History Family History  Problem Relation Age of Onset   Asthma Mother    Arthritis Sister    Asthma Brother    Arthritis Brother     Social History Social History   Tobacco Use   Smoking status: Never   Smokeless tobacco: Never  Vaping Use   Vaping Use: Never used  Substance Use Topics   Alcohol use: Yes    Alcohol/week: 1.0 standard drink of alcohol    Types: 1 Glasses of wine per week    Comment: occasionally   Drug use: Never     Allergies   Sulfa antibiotics   Review of Systems Review of Systems  Constitutional:  Positive for activity change and fatigue. Negative for appetite change and fever.  Musculoskeletal:  Positive for arthralgias, gait problem and myalgias.  Skin:  Positive for color change. Negative for wound.  Neurological:  Negative for dizziness, weakness, light-headedness, numbness and headaches.     Physical Exam Triage Vital Signs ED Triage Vitals  Enc Vitals Group     BP 01/04/22 1203 125/76     Pulse Rate 01/04/22 1203 73     Resp  01/04/22 1203 18     Temp 01/04/22 1203 98 F (36.7 C)     Temp src --      SpO2 01/04/22 1203 98 %     Weight --      Height --      Head Circumference --      Peak Flow --      Pain Score 01/04/22 1202 10     Pain Loc --      Pain Edu? --      Excl. in [place]? --  No data found.  Updated Vital Signs BP 125/76   Pulse 73   Temp 98 F (36.7 C)   Resp 18   SpO2 98%   Visual Acuity Right Eye Distance:   Left Eye Distance:   Bilateral Distance:    Right Eye Near:   Left Eye Near:    Bilateral Near:     Physical Exam Vitals reviewed.  Constitutional:      General: She is awake. She is not in acute distress.    Appearance: Normal appearance. She is well-developed. She is ill-appearing.     Comments: Very pleasant female sitting in wheelchair tearful and holding her right knee  HENT:     Head: Normocephalic and atraumatic.  Cardiovascular:     Rate and Rhythm: Normal rate and regular rhythm.     Heart sounds: Normal heart sounds, S1 normal and S2 normal. No murmur heard. Pulmonary:     Effort: Pulmonary effort is normal.     Breath sounds: Normal breath sounds. No wheezing, rhonchi or rales.     Comments: Clear to auscultation bilaterally Musculoskeletal:     Right knee: Swelling and effusion present. Decreased range of motion. Tenderness present over the medial joint line and lateral joint line.     Left knee: Swelling present. No effusion. Decreased range of motion.     Comments: Left knee: Well-healed surgical scar anterior left knee.  Mild tenderness palpation over anterior knee.  Decreased range of motion with flexion.  No ligamentous laxity on exam.  Right knee: Pain at proportion to exam with palpation of anterior right knee.  Significant swelling and effusion noted.  Decreased range of motion secondary to pain.  Unable to assess ligamentous laxity due to severity of pain.  Psychiatric:        Behavior: Behavior is cooperative.      UC Treatments / Results   Labs (all labs ordered are listed, but only abnormal results are displayed) Labs Reviewed - No data to display  EKG   Radiology No results found.  Procedures Procedures (including critical care time)  Medications Ordered in UC Medications  acetaminophen (TYLENOL) tablet 500 mg (has no administration in time range)    Initial Impression / Assessment and Plan / UC Course  I have reviewed the triage vital signs and the nursing notes.  Pertinent labs & imaging results that were available during my care of the patient were reviewed by me and considered in my medical decision making (see chart for details).     Given sudden severe swelling and unbearable pain recommended that she go to the emergency room for further evaluation and management as we do not have access to stat labs or imaging in clinic today.  Discussed that we would need to rule out a septic joint given she has had surgery in the past 90 days.  She is having some pain and swelling in the left but is worse on the right.  Discussed that this is more concerning for gout versus osteoarthritis but would need further work-up to rule out concerning etiology.  Patient is agreeable to going to the emergency room for further evaluation and management.  Her daughter accompanied her to visit today and will take her by private vehicle.  Her vital signs were stable at the time of discharge.  Final Clinical Impressions(s) / UC Diagnoses   Final diagnoses:  Bilateral knee swelling  Status post total left knee replacement  Acute pain of right knee  Discharge Instructions      Please go to the emergency room for further evaluation and management.  I am concerned given how swollen her knees are and she recently had surgery that she needs further work-up than what we have in urgent care.     ED Prescriptions   None    PDMP not reviewed this encounter.   Terrilee Croak, PA-C 01/04/22 1227

## 2022-01-04 NOTE — ED Provider Notes (Signed)
[facility] DEPT Provider Note   CSN: [id number] Arrival date & time: 01/04/22  1253     History Chief Complaint  Patient presents with   Knee Pain    [name] [name] is a 66 y.o. female with history of diabetes, hypertension presents the emergency department for evaluation of right knee pain.  Patient reports that she has chronic right knee pain due to her osteoarthritis however its been worsening for the past week incidentally worsened today.  She reports its been swollen as well.  Denies any new injury to it.  Denies any numbness or tingling.  She reports that she is having some pain with the left however it is typical and has been this way after her surgery.  Denies any fevers or any history of IV drug use.  She is ambulatory with a walker or cane at home.  The patient was sent over here from urgent care today given that she has had recent knee surgery on her LEFT knee.    Knee Pain Associated symptoms: no fever        Home Medications Prior to Admission medications   Medication Sig Start Date End Date Taking? Authorizing Provider  abemaciclib (VERZENIO) 100 MG tablet Take 1 tablet (100 mg total) by mouth 2 (two) times daily. 12/26/21   Benay Pike, MD  anastrozole (ARIMIDEX) 1 MG tablet Take 1 tablet (1 mg total) by mouth daily. 12/25/21   Benay Pike, MD  carvedilol (COREG) 6.25 MG tablet Take 6.25 mg by mouth 2 (two) times daily with a meal.    [provider]  cephALEXin (KEFLEX) 500 MG capsule Take 1 capsule (500 mg total) by mouth 4 (four) times daily. To be taken after surgery 11/13/21   Aundra Dubin, PA-C  docusate sodium (COLACE) 100 MG capsule Take 1 capsule (100 mg total) by mouth daily as needed. 11/13/21 11/13/22  Aundra Dubin, PA-C  Garlic 0086 MG CAPS Take 1,000 mg by mouth daily.    [provider]  lansoprazole (PREVACID) 30 MG capsule Take 30 mg by mouth every morning. 11/30/19   [provider]   loperamide (IMODIUM) 2 MG capsule Take 2 mg by mouth as needed for diarrhea or loose stools.    [provider]  Magnesium 100 MG CAPS Take 100 mg by mouth 2 (two) times daily.    [provider]  metFORMIN (GLUCOPHAGE) 1000 MG tablet Take 1 tablet (1,000 mg total) by mouth 2 (two) times daily with a meal. 09/24/21   Ngetich, Dinah C, NP  methocarbamol (ROBAXIN) 500 MG tablet Take 1 tablet (500 mg total) by mouth 2 (two) times daily as needed for muscle spasms. 12/03/21   Aundra Dubin, PA-C  Multiple Vitamins-Minerals (CENTRUM SILVER 50+WOMEN PO) Take 1 capsule by mouth daily.    [provider]  ondansetron (ZOFRAN) 4 MG tablet Take 1 tablet (4 mg total) by mouth every 8 (eight) hours as needed for nausea or vomiting. 11/13/21   Aundra Dubin, PA-C  oxyCODONE-acetaminophen (PERCOCET) 5-325 MG tablet Take 1-2 tablets by mouth every 6 (six) hours as needed. 12/03/21   Aundra Dubin, PA-C  rivaroxaban (XARELTO) 10 MG TABS tablet Take 1 tablet (10 mg total) by mouth daily. Take as directed after surgery. 11/20/21     UNABLE TO FIND Med Name: [place]. Pt reports taking 1 scoopful in tea or water    [provider]  valsartan (DIOVAN) 320 MG tablet Take 320 mg  by mouth daily.    [provider]  vitamin C (ASCORBIC ACID) 500 MG tablet Take 500 mg by mouth daily.    [provider]      Allergies    Sulfa antibiotics    Review of Systems   Review of Systems  Constitutional:  Negative for chills and fever.  Respiratory:  Negative for shortness of breath.   Cardiovascular:  Negative for chest pain.  Gastrointestinal:  Negative for abdominal pain, constipation, diarrhea, nausea and vomiting.  Musculoskeletal:  Positive for arthralgias and joint swelling.    Physical Exam Updated Vital Signs BP (!) 128/96   Pulse 99   Temp 98.5 F (36.9 C)   Resp 17   SpO2 99%  Physical Exam Vitals and nursing note reviewed.  Constitutional:       General: She is not in acute distress.    Appearance: Normal appearance. She is not ill-appearing or toxic-appearing.  Eyes:     General: No scleral icterus. Pulmonary:     Effort: Pulmonary effort is normal. No respiratory distress.  Musculoskeletal:        General: Swelling and tenderness present.     Comments: LLE - well healed surgical incision noted over the left knee. Is able to flex and extend her knee with some pain. Diffuse mild tenderness. Palpable pulses. Compartments soft. Sensation intact.   RLE- diffuse swelling, but mainly to the superior anterior knee. No overlying warmth, erythema, or other overlying skin changes. She is able to flex and extend her knee with some pain. Palpable pulses. Sensation intact. Compartments are soft.   Skin:    General: Skin is dry.     Findings: No rash.  Neurological:     General: No focal deficit present.     Mental Status: She is alert. Mental status is at baseline.  Psychiatric:        Mood and Affect: Mood normal.     ED Results / Procedures / Treatments   Labs (all labs ordered are listed, but only abnormal results are displayed) Labs Reviewed  CBC WITH DIFFERENTIAL/PLATELET - Abnormal; Notable for the following components:      Result Value   WBC 3.7 (*)    RBC 3.00 (*)    Hemoglobin 10.3 (*)    HCT 31.6 (*)    MCV 105.3 (*)    MCH 34.3 (*)    All other components within normal limits  I-STAT CHEM 8, ED - Abnormal; Notable for the following components:   BUN 5 (*)    Glucose, Bld 106 (*)    TCO2 20 (*)    Hemoglobin 10.5 (*)    HCT 31.0 (*)    All other components within normal limits    EKG None  Radiology No results found. CLINICAL DATA: Pain  EXAM: RIGHT KNEE - COMPLETE 4 VIEW  COMPARISON: Knee radiograph dated June 27, 2021  FINDINGS: No acute fracture or dislocation. Moderate joint effusion. Moderate to severe tricompartmental degenerative changes, unchanged when compared with prior exam. Multiple  loose bodies again seen. Diffuse demineralization.  IMPRESSION: 1. No acute osseous abnormality. 2. Moderate joint effusion.   Electronically Signed By: Yetta Glassman M.D. On: 01/04/2022 14:32    Procedures Procedures   Medications Ordered in ED Medications - No data to display  ED Course/ Medical Decision Making/ A&P Clinical Course as of 01/04/22 1829  Sat Jan 04, 2022  1521 DG Knee Complete 4 Views Right [RR]    Clinical Course User  Index [RR] Sherrell Puller, PA-C                           Medical Decision Making Amount and/or Complexity of Data Reviewed Labs: ordered. Radiology: ordered. Decision-making details documented in ED Course.  Risk Prescription drug management.    66 y/o F presents to the ED for evaluation of her right knee pain. Differential diagnosis includes but is not limited to septic arthritis, osteoarthritis, fracture, ligamentous injury.  Vital signs are unremarkable.  Physical exam as noted above.  We will order x-ray imaging and basic lab work.  I independently reviewed and interpreted the patient's labs.  I-STAT CMP shows mild decrease in BUN at 5.  Normal creatinine.  Slightly limited glucose at 106 although patient not fasting.  No other electrolyte abnormality.  CBC shows slight leukopenia at 3.7 although appears to be around patient's baseline as her last one was 3.9.  She does have some anemia with a hemoglobin of 10.3 which has been improved from her previous in the baseline of around 9.  Normal platelets.  X-ray imaging of the right knee shows No acute fracture or dislocation. Moderate joint effusion. Moderate to severe tricompartmental degenerative changes, unchanged when compared with prior exam. Multiple loose bodies again seen. Diffuse demineralization.  I suspect the patient has worsening right knee pain given that she is having to favor that knee given her recent left knee surgery.  My attending assessed at bedside and does not see  any need to do any knee tap.  We will incision for any septic arthritis.  We will give her some lidocaine patches to apply to the knee.  Recommended that she follow-up with her orthopedic surgeon this week.  Recommended that she continue taking her meloxicam as prescribed.  Recommended 1000 mg of Tylenol every 6 hours as needed and will also prescribe her some roxicodone to take for breakthrough pain.  We discussed return precautions and red flag symptoms.  Patient verbalized understanding and agrees to the plan.  Patient is stable and being discharged home in good condition.  I discussed this case with my attending physician who cosigned this note including patient's presenting symptoms, physical exam, and planned diagnostics and interventions. Attending physician stated agreement with plan or made changes to plan which were implemented.   Attending physician assessed patient at bedside.  Final Clinical Impression(s) / ED Diagnoses Final diagnoses:  Chronic pain of right knee    Rx / DC Orders ED Discharge Orders          Ordered    oxyCODONE (ROXICODONE) 5 MG immediate release tablet  Every 4 hours PRN        01/04/22 1853              Sherrell Puller, PA-C 01/04/22 1911    Gareth Morgan, MD 01/05/22 1038

## 2022-01-04 NOTE — ED Triage Notes (Signed)
Pt presents with c/o right knee pain/swelling. Pt does report some pain in her left knee but she believes that to be post-op pain as she had a left knee replacement in July. Pt's right knee is very swollen, she reports it is bone on bone.

## 2022-01-08 ENCOUNTER — Other Ambulatory Visit: Payer: Self-pay

## 2022-01-08 MED ORDER — VALSARTAN 320 MG PO TABS: 320.0000 mg | ORAL_TABLET | Freq: Every day | ORAL | 1 refills | Status: DC

## 2022-01-09 ENCOUNTER — Other Ambulatory Visit: Payer: Self-pay

## 2022-01-09 DIAGNOSIS — E785 Hyperlipidemia, unspecified: Secondary | ICD-10-CM

## 2022-01-09 DIAGNOSIS — I10 Essential (primary) hypertension: Secondary | ICD-10-CM

## 2022-01-09 DIAGNOSIS — K219 Gastro-esophageal reflux disease without esophagitis: Secondary | ICD-10-CM

## 2022-01-09 DIAGNOSIS — E1142 Type 2 diabetes mellitus with diabetic polyneuropathy: Secondary | ICD-10-CM

## 2022-01-10 ENCOUNTER — Other Ambulatory Visit: Payer: No Typology Code available for payment source

## 2022-01-10 ENCOUNTER — Encounter: Payer: Self-pay | Admitting: Oncology

## 2022-01-11 LAB — CBC WITH DIFFERENTIAL/PLATELET
Absolute Monocytes: 141 cells/uL — ABNORMAL LOW (ref 200–950)
Basophils Absolute: 42 cells/uL (ref 0–200)
Basophils Relative: 1.4 %
Eosinophils Absolute: 102 cells/uL (ref 15–500)
Eosinophils Relative: 3.4 %
HCT: 28.5 % — ABNORMAL LOW (ref 35.0–45.0)
Hemoglobin: 10 g/dL — ABNORMAL LOW (ref 11.7–15.5)
Lymphs Abs: 993 cells/uL (ref 850–3900)
MCH: 35.1 pg — ABNORMAL HIGH (ref 27.0–33.0)
MCHC: 35.1 g/dL (ref 32.0–36.0)
MCV: 100 fL (ref 80.0–100.0)
MPV: 9.5 fL (ref 7.5–12.5)
Monocytes Relative: 4.7 %
Neutro Abs: 1722 cells/uL (ref 1500–7800)
Neutrophils Relative %: 57.4 %
Platelets: 250 10*3/uL (ref 140–400)
RBC: 2.85 10*6/uL — ABNORMAL LOW (ref 3.80–5.10)
RDW: 13.1 % (ref 11.0–15.0)
Total Lymphocyte: 33.1 %
WBC: 3 10*3/uL — ABNORMAL LOW (ref 3.8–10.8)

## 2022-01-11 LAB — BASIC METABOLIC PANEL WITH GFR
BUN: 7 mg/dL (ref 7–25)
CO2: 25 mmol/L (ref 20–32)
Calcium: 9.8 mg/dL (ref 8.6–10.4)
Chloride: 103 mmol/L (ref 98–110)
Creat: 0.97 mg/dL (ref 0.50–1.05)
Glucose, Bld: 99 mg/dL (ref 65–99)
Potassium: 4.6 mmol/L (ref 3.5–5.3)
Sodium: 138 mmol/L (ref 135–146)
eGFR: 66 mL/min/{1.73_m2} (ref >=60)

## 2022-01-11 LAB — LIPID PANEL
Cholesterol: 120 mg/dL (ref <200)
HDL: 62 mg/dL (ref >=50)
LDL Cholesterol (Calc): 40 mg/dL (calc)
Non-HDL Cholesterol (Calc): 58 mg/dL (calc) (ref <130)
Total CHOL/HDL Ratio: 1.9 (calc) (ref <5.0)
Triglycerides: 94 mg/dL (ref <150)

## 2022-01-11 LAB — HEMOGLOBIN A1C
Hgb A1c MFr Bld: 5.4 % of total Hgb (ref <5.7)
Mean Plasma Glucose: 108 mg/dL
eAG (mmol/L): 6 mmol/L

## 2022-01-16 ENCOUNTER — Ambulatory Visit: Payer: No Typology Code available for payment source

## 2022-01-16 ENCOUNTER — Ambulatory Visit: Payer: No Typology Code available for payment source | Attending: Physician Assistant

## 2022-01-16 DIAGNOSIS — R600 Localized edema: Secondary | ICD-10-CM | POA: Insufficient documentation

## 2022-01-16 DIAGNOSIS — M25562 Pain in left knee: Secondary | ICD-10-CM | POA: Insufficient documentation

## 2022-01-16 DIAGNOSIS — G8929 Other chronic pain: Secondary | ICD-10-CM | POA: Insufficient documentation

## 2022-01-16 DIAGNOSIS — M6281 Muscle weakness (generalized): Secondary | ICD-10-CM | POA: Insufficient documentation

## 2022-01-16 DIAGNOSIS — R262 Difficulty in walking, not elsewhere classified: Secondary | ICD-10-CM | POA: Insufficient documentation

## 2022-01-16 NOTE — Therapy (Signed)
OUTPATIENT PHYSICAL THERAPY TREATMENT NOTE   Patient Name: [name] [name] MRN: [id number] DOB:[birth year], 66 y.o., female Today's Date: 01/16/2022  PCP: Sandrea Hughs, NP REFERRING PROVIDER: Aundra Dubin, PA-C  END OF SESSION:   PT End of Session - 01/16/22 1832     Visit Number 6    Number of Visits 17    Date for PT Re-Evaluation 02/07/22    Authorization Type None    Authorization Time Period FOTO v6, v10    PT Start Time 1831    PT Stop Time 1910    PT Time Calculation (min) 39 min    Activity Tolerance Patient tolerated treatment well    Behavior During Therapy WFL for tasks assessed/performed              Past Medical History:  Diagnosis Date   Arthritis    Breast cancer ([place])    Cancer ([place])    breast   Diabetes mellitus without complication ([place])    History of MRI    Knee, Hip, and Back.   Hypertension    Personal history of chemotherapy    Personal history of radiation therapy    Past Surgical History:  Procedure Laterality Date   BREAST BIOPSY Right 02/03/2020   x2   BREAST BIOPSY Right 02/06/2020   x2   BREAST LUMPECTOMY Right 07/26/2020   BREAST LUMPECTOMY WITH RADIOACTIVE SEED AND SENTINEL LYMPH NODE BIOPSY Right 07/26/2020   Procedure: RIGHT BREAST LUMPECTOMY WITH RADIOACTIVE SEED X 2 AND SENTINEL LYMPH NODE BIOPSY;  Surgeon: Erroll Luna, MD;  Location: [facility];  Service: General;  Laterality: Right;   IR IMAGING GUIDED PORT INSERTION  02/23/2020   PORT-A-CATH REMOVAL N/A 07/26/2020   Procedure: REMOVAL PORT-A-CATH;  Surgeon: Erroll Luna, MD;  Location: [facility];  Service: General;  Laterality: N/A;   TOTAL KNEE ARTHROPLASTY Left 11/18/2021   Procedure: LEFT TOTAL KNEE ARTHROPLASTY;  Surgeon: Leandrew Koyanagi, MD;  Location: [facility];  Service: Orthopedics;  Laterality: Left;   Patient Active Problem List   Diagnosis Date Noted   Status post total left knee replacement 11/18/2021   Primary  osteoarthritis of left knee 08/06/2021   Aortic atherosclerosis ([place]) 04/24/2020   Deep venous thrombosis of left upper extremity ([place]) 04/24/2020   Port-A-Cath in place 03/26/2020   Malignant neoplasm of upper-outer quadrant of right breast in female, estrogen receptor positive ([place]) 02/13/2020    REFERRING DIAG: R48.546 (ICD-10-CM) - S/P total knee arthroplasty, left  THERAPY DIAG:  Chronic pain of left knee  Localized edema  Difficulty in walking, not elsewhere classified  Muscle weakness (generalized)  Rationale for Evaluation and Treatment Rehabilitation  PERTINENT HISTORY: Lt TKA on 11/18/2021 with Dr. Frankey Shown, DMII, HTN, Hx of breast cx with chemo/ radiation/ surgery (in remission)  PRECAUTIONS: Knee and Fall  ONSET DATE: 11/18/2021  SUBJECTIVE: Patient reports that she went to the ED recently for pain in her R knee and was told there was "bone fragments floating around in there." She has a follow up appointment with her PCP next week to determine next steps forward with the R knee.  PAIN:  Are you having pain? Yes: NPRS scale: 5/10 l knee, 10/10 R knee Pain location: Lt knee Pain description: Achy, sharp Aggravating factors: walking >5 minutes with FWW, standing after prolonged sitting, and bending knee Relieving factors: ice, elevation, and pain medication   OBJECTIVE: (objective measures completed at initial evaluation unless otherwise dated)   DIAGNOSTIC FINDINGS:  N/A   PATIENT SURVEYS:  FOTO 38%, predicted 61% in 13 visits   COGNITION:           Overall cognitive status: Within functional limits for tasks assessed                          SENSATION: Light touch: Lt L2-S2 dermatomes impaired vs Rt   EDEMA:  Circumferential: 46.5cm on Rt, 52cm on Lt     POSTURE: flexed trunk  and weight shift right   PALPATION: TTP along Lt surgical incision   LOWER EXTREMITY ROM:   A/PROM Right eval Left eval 12/24/21 Left 12/26/21 Left 12/31/21 Left  01/02/2022 Left  Knee flexion 110 80p!,90p! 100/109 p! 105/110 p! 106/111p! 107, 115p! PROM following MET  Knee extension 2 -9p!, -5p! -3   -4   (Blank rows = not tested)   LOWER EXTREMITY MMT:   MMT Right eval Left eval  Hip flexion 4/5 3/5  Hip extension 3/5 3/5  Hip abduction 3+/5 3/5  Knee flexion 5/5 3/p!  Knee extension 3+/5p! 2+/5p!   (Blank rows = not tested)     FUNCTIONAL TESTS:  5xSTS: 29 seconds SLR: roughly 10 degrees of quad lag throughout motion   GAIT: Distance walked: 16f Assistive device utilized: WEnvironmental consultant- 2 wheeled Level of assistance: Modified independence Comments: Decreased gait speed, Lt antalgic gait with forward posture       TODAY'S TREATMENT: [facility] Adult PT Treatment:                                                DATE: 01/16/2022 Therapeutic Exercise: LAQ 3# 3x10 Lt Seated marching 3# 3x10  Supine Lt knee flexion AAROM with green strap 2x10 with 5-sec hold Lt SLR 3x10 Sidelying hip abduction 2x10 Lt, x10 Rt (too painful) Supine hip adduction ball squeeze 3x10 with 3-sec hold Seated active hamstring stretch 2x1' BIL Modalities: Vasopneumatic (Game Ready)   Location:  RIGHT knee Time:  10 minutes Pressure:  low Temperature:  34 degrees Cold pack on LEFT knee x10 mins   [facility] Adult PT Treatment:                                                DATE: 01/02/2022 Therapeutic Exercise: Supine Lt knee flexion AAROM with green strap x10 with 5-sec hold Lt SLR with hip abduction 3x10 Mini squat with UE support 3x8 Sidelying hip abduction with GTB around thighs 2x10 with 3-sec hold BIL Supine bicycle kicks 2x10 Bridge with adduction ball squeeze 2x10 with 3-sec hold Seated active hamstring stretch x247m BIL Manual Therapy: Lt knee flexion contract/ relax MET x3 with 30-sec hold at end range Neuromuscular re-ed: N/A Therapeutic Activity: N/A Modalities: Vasopneumatic (Game Ready)   Location:  left knee Time:  10 minutes Pressure:   low Temperature:  34 degrees Self Care: N/A   [facility]                                                [facility]: 12/31/2021 Therapeutic Exercise: Bike full  revolutions, no resistance, x 5 mins Step ups 4" Lt leading forward/lateral x10 each LAQ 3x10 Seated heel slides using Rt leg to pull back 5" hold x10 SLR 3x10 Supine hip adduction ball squeeze 5" hold 2x10 Supine clamshell GTB 3x10 Supine marching GTB 2x10 BIL Seated marching 2x10 BIL Heel slides with strap 5" hold 2x10 Manual Therapy: Manual flexion/extension stretch AP mobs to improve flexion/extension Patellar mobs all directions Modalities: Vasopneumatic (Game Ready)   Location:  left knee Time:  10 minutes Pressure:  low Temperature:  34 degrees    PATIENT EDUCATION:  Education details: Pt educated on prognosis, POC, FOTO, and HEP Person educated: Patient Education method: Consulting civil engineer, Media planner, and Handouts Education comprehension: verbalized understanding and returned demonstration     HOME EXERCISE PROGRAM: Access Code: [email or web address] URL: [email or web address] Date: 12/06/2021 Prepared by: Vanessa Marenisco   Exercises - Straight Leg Raise  - 2 x daily - 7 x weekly - 3 sets - 10 reps - 3-sec hold - Heel slide with strap, LAYING ON BACK  - 2 x daily - 7 x weekly - 3 sets - 10 reps - 5-sec hold - Seated Hamstring Stretch  - 2 x daily - 7 x weekly - 2 sets - 1-min hold - Step Up  - 2 x daily - 7 x weekly - 3 sets - 20 reps - 4 Way Patellar Glide  - 2 x daily - 7 x weekly - 2 sets - 10 reps   ASSESSMENT:   CLINICAL IMPRESSION: Patient presents to PT with continued pain in Lt knee and very high pain in her Rt knee. She recently visited the ED for pain in her Rt knee and has a follow up appointment with her PCP next week to determine next steps for her Rt knee pain. Session slightly regressed today to accommodate pain in the Rt knee and to not exacerbate it further. Patient continues  to benefit from skilled PT services and should be progressed as able to improve functional independence.    OBJECTIVE IMPAIRMENTS Abnormal gait, decreased balance, decreased coordination, decreased endurance, decreased knowledge of use of DME, decreased mobility, difficulty walking, decreased ROM, decreased strength, hypomobility, increased edema, impaired flexibility, improper body mechanics, postural dysfunction, and pain.    ACTIVITY LIMITATIONS carrying, lifting, bending, sitting, standing, squatting, sleeping, stairs, transfers, bed mobility, toileting, dressing, and locomotion level   PARTICIPATION LIMITATIONS: cleaning, laundry, interpersonal relationship, driving, shopping, community activity, occupation, and yard work   PERSONAL FACTORS 3+ comorbidities: See medical hx  are also affecting patient's functional outcome.        GOALS: Goals reviewed with patient? Yes   SHORT TERM GOALS: Target date: 01/03/2022  Pt will report understanding and adherence to initial HEP in order to promote independence in the management of primary impairments. Baseline: HEP provided at eval Goal status: MET Pt reports adherence 12/31/21   2.  Pt will achieve Lt knee AROM of 0-100 degrees in order to progress to future stage of post-op protocol. Baseline: -9-80 Goal status: INITIAL   3.  Pt will achieve 10 SLR with no quad lag in order to progress to future stage of post-op protocol. Baseline: 10 degrees of quad lag with 1 rep Goal status: INITIAL     LONG TERM GOALS: Target date: 01/31/2022    Pt will achieve FOTO score of 61% in order to demonstrate improved functional ability as it relates to her primary knee impairments. Baseline: 38% Goal status: INITIAL   2.  Pt will achieve Lt  knee AROM of 0-120 degrees in order to establish a normal gait pattern for community ambulation. Baseline: -9-80 Goal status: INITIAL   3.  Pt will achieve global BIL LE strength of 4+/5 or greater in order to  progress her independent LE strengthening regimen with less limitation. Baseline: See MMT chart Goal status: INITIAL   4.  Pt will report ability to walk > 20 minutes without AD in order to grocery shop with less limitation. Baseline: 5 minutes with FWW Goal status: INITIAL   5.  Pt will achieve a 5xSTS in 14 seconds or less without UE support in order to demonstrate safe transfers. Baseline: 29 seconds with UE support seconds with UE support Goal status: INITIAL       PLAN: PT FREQUENCY: 2x/week   PT DURATION: 8 weeks   PLANNED INTERVENTIONS: Therapeutic exercises, Therapeutic activity, Neuromuscular re-education, Balance training, Gait training, Patient/Family education, Self Care, Joint mobilization, Stair training, Orthotic/Fit training, DME instructions, Aquatic Therapy, Dry Needling, Electrical stimulation, Cryotherapy, Taping, Vasopneumatic device, Biofeedback, Ionotophoresis 77m/ml Dexamethasone, Manual therapy, and Re-evaluation   PLAN FOR NEXT SESSION: Progress early knee strengthening/ ROM in accordance with post-op protocol   SMargarette [place] PTA 01/16/22 7:04 PM

## 2022-01-23 ENCOUNTER — Other Ambulatory Visit: Payer: Self-pay

## 2022-01-23 ENCOUNTER — Inpatient Hospital Stay: Payer: No Typology Code available for payment source | Admitting: Pharmacist

## 2022-01-23 ENCOUNTER — Inpatient Hospital Stay: Payer: No Typology Code available for payment source | Attending: Hematology and Oncology

## 2022-01-23 VITALS — BP 103/67 | HR 67 | Temp 97.7°F | Resp 18 | Ht 66.0 in | Wt 205.0 lb

## 2022-01-23 DIAGNOSIS — Z79899 Other long term (current) drug therapy: Secondary | ICD-10-CM | POA: Insufficient documentation

## 2022-01-23 DIAGNOSIS — Z170 Estrogen receptor positive status [ER+]: Secondary | ICD-10-CM | POA: Insufficient documentation

## 2022-01-23 DIAGNOSIS — C50411 Malignant neoplasm of upper-outer quadrant of right female breast: Secondary | ICD-10-CM

## 2022-01-23 DIAGNOSIS — Z79811 Long term (current) use of aromatase inhibitors: Secondary | ICD-10-CM | POA: Insufficient documentation

## 2022-01-23 LAB — CMP (CANCER CENTER ONLY)
ALT: 8 U/L (ref 0–44)
AST: 12 U/L — ABNORMAL LOW (ref 15–41)
Albumin: 4 g/dL (ref 3.5–5.0)
Alkaline Phosphatase: 76 U/L (ref 38–126)
Anion gap: 6 (ref 5–15)
BUN: 10 mg/dL (ref 8–23)
CO2: 27 mmol/L (ref 22–32)
Calcium: 9.6 mg/dL (ref 8.9–10.3)
Chloride: 101 mmol/L (ref 98–111)
Creatinine: 0.91 mg/dL (ref 0.44–1.00)
GFR, Estimated: >60 mL/min (ref >60)
Glucose, Bld: 127 mg/dL — ABNORMAL HIGH (ref 70–99)
Potassium: 4.3 mmol/L (ref 3.5–5.1)
Sodium: 134 mmol/L — ABNORMAL LOW (ref 135–145)
Total Bilirubin: 0.2 mg/dL — ABNORMAL LOW (ref 0.3–1.2)
Total Protein: 6.7 g/dL (ref 6.5–8.1)

## 2022-01-23 LAB — CBC WITH DIFFERENTIAL (CANCER CENTER ONLY)
Abs Immature Granulocytes: 0.01 10*3/uL (ref 0.00–0.07)
Basophils Absolute: 0.1 10*3/uL (ref 0.0–0.1)
Basophils Relative: 2 %
Eosinophils Absolute: 0.2 10*3/uL (ref 0.0–0.5)
Eosinophils Relative: 5 %
HCT: 28.3 % — ABNORMAL LOW (ref 36.0–46.0)
Hemoglobin: 9.5 g/dL — ABNORMAL LOW (ref 12.0–15.0)
Immature Granulocytes: 0 %
Lymphocytes Relative: 30 %
Lymphs Abs: 1.1 10*3/uL (ref 0.7–4.0)
MCH: 35.1 pg — ABNORMAL HIGH (ref 26.0–34.0)
MCHC: 33.6 g/dL (ref 30.0–36.0)
MCV: 104.4 fL — ABNORMAL HIGH (ref 80.0–100.0)
Monocytes Absolute: 0.2 10*3/uL (ref 0.1–1.0)
Monocytes Relative: 5 %
Neutro Abs: 2.2 10*3/uL (ref 1.7–7.7)
Neutrophils Relative %: 58 %
Platelet Count: 222 10*3/uL (ref 150–400)
RBC: 2.71 MIL/uL — ABNORMAL LOW (ref 3.87–5.11)
RDW: 13.8 % (ref 11.5–15.5)
WBC Count: 3.8 10*3/uL — ABNORMAL LOW (ref 4.0–10.5)
nRBC: 0 % (ref 0.0–0.2)

## 2022-01-23 NOTE — Progress Notes (Signed)
[name] [name]       Telephone: [phone]?Fax: [phone]   Oncology Clinical Pharmacist Practitioner Progress Note  [name] [name] was contacted via in person to discuss her chemotherapy regimen for abemaciclib which they receive under the care of Dr. Benay Pike.   Current treatment regimen and start date Abemaciclib (08/15/21) 100 mg BID (12/25/21) -- went back down d/t side effects at 150 mg BID 150 mg BID (11/26/21) 100 mg BID (08/15/21)  Anastrozole (10/31/20)   Interval History She continues on abemaciclib 100 mg by mouth every 12 hours on days 1 to 28 of a 28-day cycle. This is being given in combination with anastrozole. Therapy is planned to continue until two years in the adjuvant setting per the [facility] E trial data. [name] [name] was seen today by clinical pharmacy as a follow-up to her abemaciclib management.  She was last seen by Dr. Chryl Heck on 12/25/21 and clinical pharmacy on 11/26/21.  At her last appointment with Dr. Chryl Heck, her abemaciclib dose was decreased back to 100 mg every 12 hours due to side effects and overall not feeling well on the 150 mg dose.  Response to Therapy [name] [name] is doing fairly well from an abemaciclib perspective.  She states that her side effects have improved since reducing the dose of abemaciclib and she is not having as much diarrhea or nausea at this time.  She states she will bring in the unopened boxes of abemaciclib at her next visit.  She continues to have some heartburn but it is not as severe as it was prior.  She continues to use lansoprazole and is also been started on a calcium vitamin D supplement due to her osteoporosis on her last density scan which was 11/07/21.  She was not aware of the exact dose that she picked up from her local pharmacy of the vitamin D/calcium but she will bring this in during her next visit.  After speaking with Dr. Chryl Heck, we have discussed starting an oral bisphosphonate with [name] [name] and today we gave her a  dental clearance form that she will have filled out when she has time.  We did discuss the importance of having a dental exam prior to starting a bisphosphonate and the importance of trying to start a bisphosphonate as soon as possible.  She has an appointment with her primary care physician tomorrow.  She had this appointment moved up sooner because she is having increased right knee pain.  She did have a left total knee replacement and she continues to heal from that surgery but now the pain is more in the right knee.  She states that she continues to take meloxicam.  We reviewed her med list today and updated accordingly removing several medications that she no longer takes per her report.  Labs, vitals, treatment parameters, and manufacturer guidelines assessing toxicity were reviewed with [name] today. Based on these values, patient is in agreement to continue abemaciclib therapy at this time.  Allergies Allergies  Allergen Reactions   Sulfa Antibiotics Itching    Vitals    01/23/2022    2:56 PM 01/04/2022    7:00 PM 01/04/2022    6:30 PM  Vitals with BMI  Height '5\' 6"'$     Weight 205 lbs    BMI 14.4    Systolic 315 400 867  Diastolic 67 86 81  Pulse 67 110 103   Temp Readings from Last 3 Encounters:  01/23/22 97.7 F (36.5 C) (Tympanic)  01/04/22 98.6 F (37 C)  01/04/22 98 F (36.7 C)    Laboratory Data    Latest Ref Rng & Units 01/23/2022    2:37 PM 01/10/2022    8:08 AM 01/04/2022    2:12 PM  CBC EXTENDED  WBC 4.0 - 10.5 K/uL 3.8  3.0    RBC 3.87 - 5.11 MIL/uL 2.71  2.85    Hemoglobin 12.0 - 15.0 g/dL 9.5  10.0  10.5   HCT 36.0 - 46.0 % 28.3  28.5  31.0   Platelets 150 - 400 K/uL 222  250    NEUT# 1.7 - 7.7 K/uL 2.2  1,722    Lymph# 0.7 - 4.0 K/uL 1.1  993         Latest Ref Rng & Units 01/23/2022    2:37 PM 01/10/2022    8:08 AM 01/04/2022    2:12 PM  CMP  Glucose 70 - 99 mg/dL 127  99  106   BUN 8 - 23 mg/dL '10  7  5   '$ Creatinine 0.44 - 1.00 mg/dL 0.91  0.97   0.80   Sodium 135 - 145 mmol/L 134  138  135   Potassium 3.5 - 5.1 mmol/L 4.3  4.6  4.3   Chloride 98 - 111 mmol/L 101  103  103   CO2 22 - 32 mmol/L 27  25    Calcium 8.9 - 10.3 mg/dL 9.6  9.8    Total Protein 6.5 - 8.1 g/dL 6.7     Total Bilirubin 0.3 - 1.2 mg/dL 0.2     Alkaline Phos 38 - 126 U/L 76     AST 15 - 41 U/L 12     ALT 0 - 44 U/L 8       No results found for: "MG"  Adverse Effects Assessment Diarrhea: Improved on the reduced dose of abemaciclib.  She has loperamide at home should she need it Nausea: Improved on the reduced dose of abemaciclib.  She does have antinausea meds at home should she need it Acid reflux: Slightly improved with reduced dose of abemaciclib.  She continues to take lansoprazole and we did discuss holding her garlic supplement which may also cause indigestion.  We also reviewed to limit greasy/fatty foods, eating smaller more frequent meals, and trying to avoid spicy foods when able.  She feels the indigestion is worse at night.  Adherence Assessment Vannessa Godown reports missing 0 doses over the past 4 weeks.   Reason for missed dose: N/A Patient was re-educated on importance of adherence.   Access Assessment Kellin Fifer is currently receiving her abemaciclib through [employer] concerns: None  Medication Reconciliation The patient's medication list was reviewed today with the patient?  Yes New medications or herbal supplements have recently been started?  Yes, as above, she started calcium and vitamin D supplement after speaking with Dr. Chryl Heck at her last visit.  Ms. Goyal will bring in the bottle that she picked up at her local pharmacy so that we can document the exact dose she is taking. Any medications have been discontinued?  Yes, as above, removed several medications per Ms. Rohner's report.  Many of these medications were given on a temporary basis after she had her left knee replaced The medication list was updated  and reconciled based on the patient's most recent medication list in the electronic medical record (EMR) including herbal products and OTC medications.   Medications Current Outpatient Medications  Medication Sig Dispense  Refill   abemaciclib (VERZENIO) 100 MG tablet Take 1 tablet (100 mg total) by mouth 2 (two) times daily. 60 tablet 3   anastrozole (ARIMIDEX) 1 MG tablet Take 1 tablet (1 mg total) by mouth daily. 90 tablet 4   calcium-vitamin D (OSCAL WITH D) 500-5 MG-MCG tablet Take 1 tablet by mouth daily. Will bring in dosing to next visit     carvedilol (COREG) 6.25 MG tablet Take 6.25 mg by mouth 2 (two) times daily with a meal.     lansoprazole (PREVACID) 30 MG capsule Take 30 mg by mouth every morning.     Magnesium 100 MG CAPS Take 100 mg by mouth 2 (two) times daily.     meloxicam (MOBIC) 15 MG tablet Take 15 mg by mouth daily.     metFORMIN (GLUCOPHAGE) 1000 MG tablet Take 1 tablet (1,000 mg total) by mouth 2 (two) times daily with a meal. 180 tablet 1   Multiple Vitamins-Minerals (CENTRUM SILVER 50+WOMEN PO) Take 1 capsule by mouth daily.     UNABLE TO FIND Med Name: [place]. Pt reports taking 1 scoopful in tea or water     valsartan (DIOVAN) 320 MG tablet Take 1 tablet (320 mg total) by mouth daily. 90 tablet 1   vitamin C (ASCORBIC ACID) 500 MG tablet Take 500 mg by mouth daily.     loperamide (IMODIUM) 2 MG capsule Take 2 mg by mouth as needed for diarrhea or loose stools. (Patient not taking: Reported on 01/23/2022)     ondansetron (ZOFRAN) 4 MG tablet Take 1 tablet (4 mg total) by mouth every 8 (eight) hours as needed for nausea or vomiting. (Patient not taking: Reported on 01/23/2022) 40 tablet 0   No current facility-administered medications for this visit.    Drug-Drug Interactions (DDIs) DDIs were evaluated?  Yes Significant DDIs?  No The patient was instructed to speak with their health care provider and/or the oral chemotherapy pharmacist before starting any new drug,  including prescription or over the counter, natural / herbal products, or vitamins.  Supportive Care Diarrhea: we reviewed that diarrhea is common with abemaciclib and confirmed that she does have loperamide (Imodium) ILD/Pneumonitis: we reviewed potential symptoms including cough, shortness, and fatigue.  VTE: reviewed signs of DVT such as leg swelling, redness, pain, or tenderness and signs of PE such as shortness of   Dosing Assessment Hepatic adjustments needed?  No Renal adjustments needed?  No Toxicity adjustments needed?  No The current dosing regimen is appropriate to continue at this time.  Follow-Up Plan Continue abemaciclib 100 mg by mouth every 12 hours.  This will be the max dose that she is able to take as she did not tolerate the 150 mg dose.  Ms. Novitski will bring in the unused 150 mg tablets at her next visit Continue anastrozole 1 mg by mouth daily Ms. Polito will obtain dental clearance and then likely start a oral bisphosphonate due to her osteoporosis that was documented on her bone density scan on 11/07/21.  She has started calcium/vitamin D for Dr. Chryl Heck at her last visit.  Ms. Benning will bring in the bottle so we can record the exact dose and frequency at her next visit We will see her primary care physician Dr. Kelby Fam tomorrow. Labs, Dr. Chryl Heck visit, on 02/19/22 Will add labs, pharmacy clinic visit, on 03/19/22.  After her December visit if she is tolerating abemaciclib well, manufacturing guidelines say that labs can be periodically or as clinically indicated.  May  consider spacing them out to every 3 months or Per Dr. Rob Hickman recommendations.  [name] participated in the discussion, expressed understanding, and voiced agreement with the above plan. All questions were answered to her satisfaction. The patient was advised to contact the clinic at [phone] with any questions or concerns prior to her return visit.   I spent 30 minutes assessing and educating the  patient.  Raina Mina, RPH-CPP, 01/23/2022  3:44 PM   **Disclaimer: This note was dictated with voice recognition software. Similar sounding words can inadvertently be transcribed and this note may contain transcription errors which may not have been corrected upon publication of note.**

## 2022-01-24 ENCOUNTER — Ambulatory Visit (INDEPENDENT_AMBULATORY_CARE_PROVIDER_SITE_OTHER): Payer: Self-pay | Admitting: Family

## 2022-01-24 ENCOUNTER — Encounter: Payer: Self-pay | Admitting: Family

## 2022-01-24 VITALS — BP 110/60 | HR 59 | Temp 97.2°F | Resp 18 | Ht 66.0 in | Wt 204.2 lb

## 2022-01-24 DIAGNOSIS — G8929 Other chronic pain: Secondary | ICD-10-CM

## 2022-01-24 DIAGNOSIS — I10 Essential (primary) hypertension: Secondary | ICD-10-CM

## 2022-01-24 DIAGNOSIS — M159 Polyosteoarthritis, unspecified: Secondary | ICD-10-CM

## 2022-01-24 DIAGNOSIS — E1142 Type 2 diabetes mellitus with diabetic polyneuropathy: Secondary | ICD-10-CM

## 2022-01-24 DIAGNOSIS — K2100 Gastro-esophageal reflux disease with esophagitis, without bleeding: Secondary | ICD-10-CM

## 2022-01-24 DIAGNOSIS — M25561 Pain in right knee: Secondary | ICD-10-CM

## 2022-01-24 DIAGNOSIS — M25562 Pain in left knee: Secondary | ICD-10-CM

## 2022-01-24 DIAGNOSIS — E785 Hyperlipidemia, unspecified: Secondary | ICD-10-CM

## 2022-01-24 MED ORDER — DICLOFENAC SODIUM 75 MG PO TBEC: 75.0000 mg | DELAYED_RELEASE_TABLET | Freq: Two times a day (BID) | ORAL | 0 refills | Status: DC

## 2022-01-24 NOTE — Progress Notes (Signed)
Provider: Marlowe Sax FNP-C   [name] [name], Nelda Bucks, NP  Patient Care Team: Heide Brossart, Nelda Bucks, NP as PCP - General (Family Medicine) Mauro Kaufmann, RN as Oncology Nurse Navigator Rockwell Germany, RN as Oncology Nurse Navigator Erroll Luna, MD as Consulting Physician (General Surgery) Eppie Gibson, MD as Attending Physician (Radiation Oncology) Benay Pike, MD as Medical Oncologist (Hematology and Oncology) Raina Mina, RPH-CPP as Pharmacist (Hematology and Oncology)  Extended Emergency Contact Information Primary Emergency Contact: [place], [place], [place] [address] Johnnette Litter of [place] Phone: [phone] Mobile Phone: [phone] Relation: Daughter  Code Status:  Full Code  Goals of care: Advanced Directive information    01/24/2022    3:13 PM  Advanced Directives  Does Patient Have a Medical Advance Directive? No  Would patient like information on creating a medical advance directive? No - Patient declined     Chief Complaint  Patient presents with   Medical Management of Chronic Issues    6 month follow up.   Health Maintenance    Discuss the need for Diabetic kidney evaluation, and Colonoscopy.   Immunizations    Discuss the need for Shingrix vaccine, and Influenza vaccine.    HPI:  Pt is a 66 y.o. female seen today for 6 months follow-up for medical management of chronic diseases.   Has a history of knee replacement but continues to have pain in the left knee and the right knee. Recent blood work results reviewed and discussed during visit Hemoglobin A1c has improved from 6.0 to 5.4  Hgb 10.0 previous 10.5 denies any symptoms of anemia. Due for Shingrix and influenza vaccine   Past Medical History:  Diagnosis Date   Arthritis    Breast cancer ([place])    Cancer ([place])    breast   Diabetes mellitus without complication ([place])    History of MRI    Knee, Hip, and Back.   Hypertension    Personal history of chemotherapy     Personal history of radiation therapy    Past Surgical History:  Procedure Laterality Date   BREAST BIOPSY Right 02/03/2020   x2   BREAST BIOPSY Right 02/06/2020   x2   BREAST LUMPECTOMY Right 07/26/2020   BREAST LUMPECTOMY WITH RADIOACTIVE SEED AND SENTINEL LYMPH NODE BIOPSY Right 07/26/2020   Procedure: RIGHT BREAST LUMPECTOMY WITH RADIOACTIVE SEED X 2 AND SENTINEL LYMPH NODE BIOPSY;  Surgeon: Erroll Luna, MD;  Location: [facility];  Service: General;  Laterality: Right;   IR IMAGING GUIDED PORT INSERTION  02/23/2020   PORT-A-CATH REMOVAL N/A 07/26/2020   Procedure: REMOVAL PORT-A-CATH;  Surgeon: Erroll Luna, MD;  Location: [facility];  Service: General;  Laterality: N/A;   TOTAL KNEE ARTHROPLASTY Left 11/18/2021   Procedure: LEFT TOTAL KNEE ARTHROPLASTY;  Surgeon: Leandrew Koyanagi, MD;  Location: [facility];  Service: Orthopedics;  Laterality: Left;    Allergies  Allergen Reactions   Sulfa Antibiotics Itching    Allergies as of 01/24/2022       Reactions   Sulfa Antibiotics Itching        Medication List        Accurate as of January 24, 2022  3:45 PM. If you have any questions, ask your nurse or doctor.          STOP taking these medications    loperamide 2 MG capsule Commonly known as: IMODIUM Stopped by: Sandrea Hughs, NP  meloxicam 15 MG tablet Commonly known as: MOBIC Stopped by: Nelda Bucks Kemoni Ortega, NP   ondansetron 4 MG tablet Commonly known as: Zofran Stopped by: Sandrea Hughs, NP       TAKE these medications    abemaciclib 100 MG tablet Commonly known as: VERZENIO Take 1 tablet (100 mg total) by mouth 2 (two) times daily.   anastrozole 1 MG tablet Commonly known as: ARIMIDEX Take 1 tablet (1 mg total) by mouth daily.   ascorbic acid 500 MG tablet Commonly known as: VITAMIN C Take 500 mg by mouth daily.   atorvastatin 40 MG tablet Commonly known as: LIPITOR Take 40 mg by mouth daily.    calcium-vitamin D 500-5 MG-MCG tablet Commonly known as: OSCAL WITH D Take 1 tablet by mouth daily. Will bring in dosing to next visit   carvedilol 6.25 MG tablet Commonly known as: COREG Take 6.25 mg by mouth 2 (two) times daily with a meal.   CENTRUM SILVER 50+WOMEN PO Take 1 capsule by mouth daily.   diclofenac 75 MG EC tablet Commonly known as: VOLTAREN Take 1 tablet (75 mg total) by mouth 2 (two) times daily. Started by: Sandrea Hughs, NP   lansoprazole 30 MG capsule Commonly known as: PREVACID Take 30 mg by mouth every morning.   Magnesium 100 MG Caps Take 100 mg by mouth 2 (two) times daily.   metFORMIN 1000 MG tablet Commonly known as: GLUCOPHAGE Take 1 tablet (1,000 mg total) by mouth 2 (two) times daily with a meal.   UNABLE TO FIND Med Name: Berwind. Pt reports taking 1 scoopful in tea or water   valsartan 320 MG tablet Commonly known as: DIOVAN Take 1 tablet (320 mg total) by mouth daily.        Review of Systems  Constitutional:  Negative for appetite change, chills, fatigue, fever and unexpected weight change.  HENT:  Negative for congestion, dental problem, ear discharge, ear pain, facial swelling, hearing loss, nosebleeds, postnasal drip, rhinorrhea, sinus pressure, sinus pain, sneezing, sore throat, tinnitus and trouble swallowing.   Eyes:  Negative for pain, discharge, redness, itching and visual disturbance.  Respiratory:  Negative for cough, chest tightness, shortness of breath and wheezing.   Cardiovascular:  Negative for chest pain, palpitations and leg swelling.  Gastrointestinal:  Negative for abdominal distention, abdominal pain, blood in stool, constipation, diarrhea, nausea and vomiting.  Endocrine: Negative for cold intolerance, heat intolerance, polydipsia, polyphagia and polyuria.  Genitourinary:  Negative for difficulty urinating, dysuria, flank pain, frequency and urgency.  Musculoskeletal:  Negative for arthralgias, back pain, gait  problem, joint swelling, myalgias, neck pain and neck stiffness.  Skin:  Negative for color change, pallor, rash and wound.  Neurological:  Negative for dizziness, syncope, speech difficulty, weakness, light-headedness, numbness and headaches.  Hematological:  Does not bruise/bleed easily.  Psychiatric/Behavioral:  Negative for agitation, behavioral problems, confusion, hallucinations, self-injury, sleep disturbance and suicidal ideas. The patient is not nervous/anxious.     Immunization History  Administered Date(s) Administered   DTaP 09/04/2017, 10/05/2017   Hepatitis B 09/04/2017, 10/05/2017   Influenza-Unspecified 09/04/2017, 04/19/2019   MMR 09/04/2017   Tdap 04/19/2019   Unspecified SARS-COV-2 Vaccination 09/13/2019, 11/06/2019   Varicella 09/04/2017, 06/30/2018   Pertinent  Health Maintenance Due  Topic Date Due   COLONOSCOPY (Pts 45-44yr Insurance coverage will need to be confirmed)  Never done   INFLUENZA VACCINE  12/10/2021   MAMMOGRAM  02/08/2023   PAP SMEAR-Modifier  02/08/2024  11/18/2021    9:00 PM 11/19/2021    8:30 AM 01/04/2022   12:02 PM 01/04/2022    1:28 PM 01/24/2022    3:12 PM  Fall Risk  Falls in the past year?     0  Was there an injury with Fall?     0  Fall Risk Category Calculator     0  Fall Risk Category     Low  Patient Fall Risk Level Moderate fall risk Moderate fall risk Low fall risk Low fall risk Low fall risk  Patient at Risk for Falls Due to     No Fall Risks  Fall risk Follow up     Falls evaluation completed   Functional Status Survey:    Vitals:   01/24/22 1506  BP: 110/60  Pulse: (!) 59  Resp: 18  Temp: (!) 97.2 F (36.2 C)  SpO2: 92%  Weight: 204 lb 3.2 oz (92.6 kg)  Height: _0  (1.676 m)   Body mass index is 32.96 kg/m. Physical Exam Vitals reviewed.  Constitutional:      General: She is not in acute distress.    Appearance: Normal appearance. She is obese. She is not ill-appearing or diaphoretic.  HENT:      Head: Normocephalic.     Right Ear: Tympanic membrane, ear canal and external ear normal. There is no impacted cerumen.     Left Ear: Tympanic membrane, ear canal and external ear normal. There is no impacted cerumen.     Nose: Nose normal. No congestion or rhinorrhea.     Mouth/Throat:     Mouth: Mucous membranes are moist.     Pharynx: Oropharynx is clear. No oropharyngeal exudate or posterior oropharyngeal erythema.  Eyes:     General: No scleral icterus.       Right eye: No discharge.        Left eye: No discharge.     Extraocular Movements: Extraocular movements intact.     Conjunctiva/sclera: Conjunctivae normal.     Pupils: Pupils are equal, round, and reactive to light.  Neck:     Vascular: No carotid bruit.  Cardiovascular:     Rate and Rhythm: Normal rate and regular rhythm.     Pulses: Normal pulses.     Heart sounds: Normal heart sounds. No murmur heard.    No friction rub. No gallop.  Pulmonary:     Effort: Pulmonary effort is normal. No respiratory distress.     Breath sounds: Normal breath sounds. No wheezing, rhonchi or rales.  Chest:     Chest wall: No tenderness.  Abdominal:     General: Bowel sounds are normal. There is no distension.     Palpations: Abdomen is soft. There is no mass.     Tenderness: There is no abdominal tenderness. There is no right CVA tenderness, left CVA tenderness, guarding or rebound.  Musculoskeletal:        General: No swelling or tenderness. Normal range of motion.     Cervical back: Normal range of motion. No rigidity or tenderness.     Right lower leg: No edema.     Left lower leg: No edema.  Lymphadenopathy:     Cervical: No cervical adenopathy.  Skin:    General: Skin is warm and dry.     Coloration: Skin is not pale.     Findings: No bruising, erythema, lesion or rash.  Neurological:     Mental Status: She is alert and oriented to person,  place, and time.     Cranial Nerves: No cranial nerve deficit.     Sensory: No  sensory deficit.     Motor: No weakness.     Coordination: Coordination normal.     Gait: Gait normal.  Psychiatric:        Mood and Affect: Mood normal.        Speech: Speech normal.        Behavior: Behavior normal.        Thought Content: Thought content normal.        Judgment: Judgment normal.     Labs reviewed: Recent Labs    12/25/21 1458 01/04/22 1412 01/10/22 0808 01/23/22 1437  NA 135 135 138 134*  K 4.7 4.3 4.6 4.3  CL 106 103 103 101  CO2 23  --  25 27  GLUCOSE 103* 106* 99 127*  BUN 8 5* 7 10  CREATININE 0.90 0.80 0.97 0.91  CALCIUM 9.3  --  9.8 9.6   Recent Labs    11/26/21 1438 12/25/21 1458 01/23/22 1437  AST 26 15 12*  ALT _0 ALKPHOS 88 63 76  BILITOT 0.7 0.2* 0.2*  PROT 7.2 6.8 6.7  ALBUMIN 4.2 3.6 4.0   Recent Labs    01/04/22 1405 01/04/22 1412 01/10/22 0808 01/23/22 1437  WBC 3.7*  --  3.0* 3.8*  NEUTROABS 2.1  --  1,722 2.2  HGB 10.3* 10.5* 10.0* 9.5*  HCT 31.6* 31.0* 28.5* 28.3*  MCV 105.3*  --  100.0 104.4*  PLT 219  --  250 222   Lab Results  Component Value Date   TSH 2.38 08/14/2021   Lab Results  Component Value Date   HGBA1C 5.4 01/10/2022   Lab Results  Component Value Date   CHOL 120 01/10/2022   HDL 62 01/10/2022   LDLCALC 40 01/10/2022   TRIG 94 01/10/2022   CHOLHDL 1.9 01/10/2022    Significant Diagnostic Results in last 30 days:  DG Knee Complete 4 Views Right  Result Date: 01/04/2022 CLINICAL DATA:  Pain EXAM: RIGHT KNEE - COMPLETE 4 VIEW COMPARISON:  Knee radiograph dated June 27, 2021 FINDINGS: No acute fracture or dislocation. Moderate joint effusion. Moderate to severe tricompartmental degenerative changes, unchanged when compared with prior exam. Multiple loose bodies again seen. Diffuse demineralization. IMPRESSION: 1. No acute osseous abnormality. 2. Moderate joint effusion. Electronically Signed   By: Yetta Glassman M.D.   On: 01/04/2022 14:32   XR Knee 1-2 Views Left  Result Date:  12/31/2021 Stable total knee replacement in good alignment.    Assessment/Plan 1. Essential hypertension Blood pressure well controlled -Continue on valsartan and carvedilol  2. Hyperlipidemia LDL goal <100 Continue on atorvastatin -Dietary management and exercise advised  3. DM type 2 with diabetic peripheral neuropathy [facility]) Lab Results  Component Value Date   HGBA1C 5.4 01/10/2022  Well-controlled -On metformin -Advised to monitor blood sugars at home if continues to be stable consider reducing metformin  4. Gastroesophageal reflux disease with esophagitis without hemorrhage Symptoms controlled -Continue on Prevacid  5. Primary osteoarthritis involving multiple joints Pain worse on the knees - diclofenac (VOLTAREN) 75 MG EC tablet; Take 1 tablet (75 mg total) by mouth 2 (two) times daily.  Dispense: 30 tablet; Refill: 0  6. Chronic pain of both knees History of left knee replacement right knee pain worse - diclofenac (VOLTAREN) 75 MG EC tablet; Take 1 tablet (75 mg total) by mouth 2 (two) times daily.  Dispense:  30 tablet; Refill: 0  Family/ staff Communication: Reviewed plan of care with patient verbalized understanding  Labs/tests ordered:  - CBC with Differential/Platelet - CMP with eGFR([facility]) - TSH - Hgb A1C - Lipid panel  Next Appointment : Return in about 6 months (around 07/25/2022) for medical mangement of chronic issues., Fasting labs in 6 months prior to visit.   Sandrea Hughs, NP

## 2022-01-24 NOTE — Patient Instructions (Signed)
-   Please schedule appointment with a dentist to evaluate dental cavities  [facility] at [phone]  Address: [address] [place] [address]

## 2022-01-28 ENCOUNTER — Encounter: Payer: Self-pay | Admitting: Physical Therapy

## 2022-01-28 ENCOUNTER — Ambulatory Visit: Payer: No Typology Code available for payment source | Admitting: Physical Therapy

## 2022-01-28 DIAGNOSIS — M6281 Muscle weakness (generalized): Secondary | ICD-10-CM

## 2022-01-28 DIAGNOSIS — R600 Localized edema: Secondary | ICD-10-CM

## 2022-01-28 DIAGNOSIS — R262 Difficulty in walking, not elsewhere classified: Secondary | ICD-10-CM

## 2022-01-28 DIAGNOSIS — G8929 Other chronic pain: Secondary | ICD-10-CM

## 2022-01-28 NOTE — Therapy (Signed)
OUTPATIENT PHYSICAL THERAPY TREATMENT NOTE   Patient Name: [name] [name] MRN: [id number] DOB:17-Aug-1955, 66 y.o., female Today's Date: 01/29/2022  PCP: Sandrea Hughs, NP REFERRING PROVIDER: Aundra Dubin, PA-C  END OF SESSION:   PT End of Session - 01/28/22 1748     Visit Number 7    Number of Visits 17    Date for PT Re-Evaluation 02/07/22    Authorization Type None    Authorization Time Period FOTO v6, v10    PT Start Time 0545    PT Stop Time 0626    PT Time Calculation (min) 41 min    Activity Tolerance Patient tolerated treatment well    Behavior During Therapy WFL for tasks assessed/performed              Past Medical History:  Diagnosis Date   Arthritis    Breast cancer ([place])    Cancer ([place])    breast   Diabetes mellitus without complication ([place])    History of MRI    Knee, Hip, and Back.   Hypertension    Personal history of chemotherapy    Personal history of radiation therapy    Past Surgical History:  Procedure Laterality Date   BREAST BIOPSY Right 02/03/2020   x2   BREAST BIOPSY Right 02/06/2020   x2   BREAST LUMPECTOMY Right 07/26/2020   BREAST LUMPECTOMY WITH RADIOACTIVE SEED AND SENTINEL LYMPH NODE BIOPSY Right 07/26/2020   Procedure: RIGHT BREAST LUMPECTOMY WITH RADIOACTIVE SEED X 2 AND SENTINEL LYMPH NODE BIOPSY;  Surgeon: Erroll Luna, MD;  Location: [facility];  Service: General;  Laterality: Right;   IR IMAGING GUIDED PORT INSERTION  02/23/2020   PORT-A-CATH REMOVAL N/A 07/26/2020   Procedure: REMOVAL PORT-A-CATH;  Surgeon: Erroll Luna, MD;  Location: [facility];  Service: General;  Laterality: N/A;   TOTAL KNEE ARTHROPLASTY Left 11/18/2021   Procedure: LEFT TOTAL KNEE ARTHROPLASTY;  Surgeon: Leandrew Koyanagi, MD;  Location: [facility];  Service: Orthopedics;  Laterality: Left;   Patient Active Problem List   Diagnosis Date Noted   Status post total left knee replacement 11/18/2021   Primary  osteoarthritis of left knee 08/06/2021   Aortic atherosclerosis ([place]) 04/24/2020   Deep venous thrombosis of left upper extremity ([place]) 04/24/2020   Port-A-Cath in place 03/26/2020   Malignant neoplasm of upper-outer quadrant of right breast in female, estrogen receptor positive ([place]) 02/13/2020    REFERRING DIAG: I01.655 (ICD-10-CM) - S/P total knee arthroplasty, left  THERAPY DIAG:  Chronic pain of left knee  Localized edema  Difficulty in walking, not elsewhere classified  Muscle weakness (generalized)  Rationale for Evaluation and Treatment Rehabilitation  PERTINENT HISTORY: Lt TKA on 11/18/2021 with Dr. Frankey Shown, DMII, HTN, Hx of breast cx with chemo/ radiation/ surgery (in remission)  PRECAUTIONS: Knee and Fall  ONSET DATE: 11/18/2021  SUBJECTIVE: Patient reports that she went to the ED recently for pain in her R knee and was told there was "bone fragments floating around in there." She has a follow up appointment with her PCP next week to determine next steps forward with the R knee.  PAIN:  Are you having pain? Yes: NPRS scale: 5/10 l knee, 10/10 R knee Pain location: Lt knee Pain description: Achy, sharp Aggravating factors: walking >5 minutes with FWW, standing after prolonged sitting, and bending knee Relieving factors: ice, elevation, and pain medication   OBJECTIVE: (objective measures completed at initial evaluation unless otherwise dated)   DIAGNOSTIC FINDINGS:  N/A   PATIENT SURVEYS:  FOTO 38%, predicted 61% in 13 visits   COGNITION:           Overall cognitive status: Within functional limits for tasks assessed                          SENSATION: Light touch: Lt L2-S2 dermatomes impaired vs Rt   EDEMA:  Circumferential: 46.5cm on Rt, 52cm on Lt     POSTURE: flexed trunk  and weight shift right   PALPATION: TTP along Lt surgical incision   LOWER EXTREMITY ROM:   A/PROM Right eval Left eval 12/24/21 Left 12/26/21 Left 12/31/21 Left  01/02/2022 Left  Knee flexion 110 80p!,90p! 100/109 p! 105/110 p! 106/111p! 107, 115p! PROM following MET  Knee extension 2 -9p!, -5p! -3   -4   (Blank rows = not tested)   LOWER EXTREMITY MMT:   MMT Right eval Left eval  Hip flexion 4/5 3/5  Hip extension 3/5 3/5  Hip abduction 3+/5 3/5  Knee flexion 5/5 3/p!  Knee extension 3+/5p! 2+/5p!   (Blank rows = not tested)     FUNCTIONAL TESTS:  5xSTS: 29 seconds SLR: roughly 10 degrees of quad lag throughout motion   GAIT: Distance walked: 60f Assistive device utilized: WEnvironmental consultant- 2 wheeled Level of assistance: Modified independence Comments: Decreased gait speed, Lt antalgic gait with forward posture       TODAY'S TREATMENT:  [facility] Adult PT Treatment:                                                DATE: 01/28/2022 Therapeutic Exercise: Nu step - L5 5 min - seat 9  Standing slant board stretch - 2' Heel raise 3x15 Knee flexion stretch on step - 20x 4'' step up fwd and lat - 2x10 ea LAQ 5# 4x10 Lt Seated marching 5# 3x10  Lt SLR 4x10 Sidelying hip abduction 3x10 Lt Supine hip adduction ball squeeze 3x10 with 3-sec hold  Balance Tandem stance 45'' bouts  Ice: 10 min  [facility] Adult PT Treatment:                                                DATE: 01/16/2022 Therapeutic Exercise: LAQ 3# 3x10 Lt Seated marching 3# 3x10  Supine Lt knee flexion AAROM with green strap 2x10 with 5-sec hold Lt SLR 3x10 Sidelying hip abduction 2x10 Lt, x10 Rt (too painful) Supine hip adduction ball squeeze 3x10 with 3-sec hold Seated active hamstring stretch 2x1' BIL Modalities: Vasopneumatic (Game Ready)   Location:  RIGHT knee Time:  10 minutes Pressure:  low Temperature:  34 degrees Cold pack on LEFT knee x10 mins   [facility] Adult PT Treatment:                                                DATE: 01/02/2022 Therapeutic Exercise: Supine Lt knee flexion AAROM with green strap x10 with 5-sec hold Lt SLR with hip abduction 3x10 Mini squat  with UE support 3x8 Sidelying hip abduction with GTB around thighs  2x10 with 3-sec hold BIL Supine bicycle kicks 2x10 Bridge with adduction ball squeeze 2x10 with 3-sec hold Seated active hamstring stretch x73mn BIL Manual Therapy: Lt knee flexion contract/ relax MET x3 with 30-sec hold at end range Neuromuscular re-ed: N/A Therapeutic Activity: N/A Modalities: Vasopneumatic (Game Ready)   Location:  left knee Time:  10 minutes Pressure:  low Temperature:  34 degrees Self Care: N/A   [facility] Adult PT Treatment:                                                DATE: 12/31/2021 Therapeutic Exercise: Bike full revolutions, no resistance, x 5 mins Step ups 4" Lt leading forward/lateral x10 each LAQ 3x10 Seated heel slides using Rt leg to pull back 5" hold x10 SLR 3x10 Supine hip adduction ball squeeze 5" hold 2x10 Supine clamshell GTB 3x10 Supine marching GTB 2x10 BIL Seated marching 2x10 BIL Heel slides with strap 5" hold 2x10 Manual Therapy: Manual flexion/extension stretch AP mobs to improve flexion/extension Patellar mobs all directions Modalities: Vasopneumatic (Game Ready)   Location:  left knee Time:  10 minutes Pressure:  low Temperature:  34 degrees    PATIENT EDUCATION:  Education details: Pt educated on prognosis, POC, FOTO, and HEP Person educated: Patient Education method: EConsulting civil engineer DMedia planner and Handouts Education comprehension: verbalized understanding and returned demonstration     HOME EXERCISE PROGRAM: Access Code: [email or web address]: [email or web address] Date: 12/06/2021 Prepared by: TVanessa Pelion  Exercises - Straight Leg Raise  - 2 x daily - 7 x weekly - 3 sets - 10 reps - 3-sec hold - Heel slide with strap, LAYING ON BACK  - 2 x daily - 7 x weekly - 3 sets - 10 reps - 5-sec hold - Seated Hamstring Stretch  - 2 x daily - 7 x weekly - 2 sets - 1-min hold - Step Up  - 2 x daily - 7 x weekly - 3 sets - 20 reps - 4 Way  Patellar Glide  - 2 x daily - 7 x weekly - 2 sets - 10 reps   ASSESSMENT:   CLINICAL IMPRESSION: Leilani tolerated session well with no adverse reaction.  She is significantly limited by her R knee pain.  She demonstrates significant lack of flexion bil with highly antalgic gait.  We concentrated on strengthening in comfortable positions today.   OBJECTIVE IMPAIRMENTS Abnormal gait, decreased balance, decreased coordination, decreased endurance, decreased knowledge of use of DME, decreased mobility, difficulty walking, decreased ROM, decreased strength, hypomobility, increased edema, impaired flexibility, improper body mechanics, postural dysfunction, and pain.    ACTIVITY LIMITATIONS carrying, lifting, bending, sitting, standing, squatting, sleeping, stairs, transfers, bed mobility, toileting, dressing, and locomotion level   PARTICIPATION LIMITATIONS: cleaning, laundry, interpersonal relationship, driving, shopping, community activity, occupation, and yard work   PERSONAL FACTORS 3+ comorbidities: See medical hx  are also affecting patient's functional outcome.        GOALS: Goals reviewed with patient? Yes   SHORT TERM GOALS: Target date: 01/03/2022  Pt will report understanding and adherence to initial HEP in order to promote independence in the management of primary impairments. Baseline: HEP provided at eval Goal status: MET Pt reports adherence 12/31/21   2.  Pt will achieve Lt knee AROM of 0-100 degrees in order to progress to future stage of post-op protocol. Baseline: -9-80 Goal status: INITIAL  3.  Pt will achieve 10 SLR with no quad lag in order to progress to future stage of post-op protocol. Baseline: 10 degrees of quad lag with 1 rep Goal status: INITIAL     LONG TERM GOALS: Target date: 01/31/2022    Pt will achieve FOTO score of 61% in order to demonstrate improved functional ability as it relates to her primary knee impairments. Baseline: 38% Goal status:  INITIAL   2.  Pt will achieve Lt knee AROM of 0-120 degrees in order to establish a normal gait pattern for community ambulation. Baseline: -9-80 Goal status: INITIAL   3.  Pt will achieve global BIL LE strength of 4+/5 or greater in order to progress her independent LE strengthening regimen with less limitation. Baseline: See MMT chart Goal status: INITIAL   4.  Pt will report ability to walk > 20 minutes without AD in order to grocery shop with less limitation. Baseline: 5 minutes with FWW Goal status: INITIAL   5.  Pt will achieve a 5xSTS in 14 seconds or less without UE support in order to demonstrate safe transfers. Baseline: 29 seconds with UE support seconds with UE support Goal status: INITIAL       PLAN: PT FREQUENCY: 2x/week   PT DURATION: 8 weeks   PLANNED INTERVENTIONS: Therapeutic exercises, Therapeutic activity, Neuromuscular re-education, Balance training, Gait training, Patient/Family education, Self Care, Joint mobilization, Stair training, Orthotic/Fit training, DME instructions, Aquatic Therapy, Dry Needling, Electrical stimulation, Cryotherapy, Taping, Vasopneumatic device, Biofeedback, Ionotophoresis 69m/ml Dexamethasone, Manual therapy, and Re-evaluation   PLAN FOR NEXT SESSION: Progress early knee strengthening/ ROM in accordance with post-op protocol   KKevan NyReinhartsen PT 01/29/22 9:26 AM

## 2022-02-03 DIAGNOSIS — G8929 Other chronic pain: Secondary | ICD-10-CM

## 2022-02-03 DIAGNOSIS — M159 Polyosteoarthritis, unspecified: Secondary | ICD-10-CM

## 2022-02-03 MED ORDER — ATORVASTATIN CALCIUM 40 MG PO TABS: 40.0000 mg | ORAL_TABLET | Freq: Every day | ORAL | 1 refills | Status: DC

## 2022-02-03 MED ORDER — DICLOFENAC SODIUM 75 MG PO TBEC: 75.0000 mg | DELAYED_RELEASE_TABLET | Freq: Two times a day (BID) | ORAL | 5 refills | Status: DC

## 2022-02-03 MED ORDER — CARVEDILOL 6.25 MG PO TABS: 6.2500 mg | ORAL_TABLET | Freq: Two times a day (BID) | ORAL | 1 refills | Status: DC

## 2022-02-03 NOTE — Addendum Note (Signed)
Addended by: Logan Bores on: 02/03/2022 10:52 AM   Modules accepted: Orders

## 2022-02-05 ENCOUNTER — Ambulatory Visit: Payer: No Typology Code available for payment source

## 2022-02-05 DIAGNOSIS — R600 Localized edema: Secondary | ICD-10-CM

## 2022-02-05 DIAGNOSIS — M6281 Muscle weakness (generalized): Secondary | ICD-10-CM

## 2022-02-05 DIAGNOSIS — R262 Difficulty in walking, not elsewhere classified: Secondary | ICD-10-CM

## 2022-02-05 DIAGNOSIS — G8929 Other chronic pain: Secondary | ICD-10-CM

## 2022-02-05 NOTE — Therapy (Signed)
OUTPATIENT PHYSICAL THERAPY TREATMENT NOTE/ RE-CERTIFICATION   Patient Name: [name] [name] MRN: [id number] DOB:[birth year], 66 y.o., female Today's Date: 02/05/2022  PCP: Sandrea Hughs, NP REFERRING PROVIDER: Aundra Dubin, PA-C  END OF SESSION:   PT End of Session - 02/05/22 1826     Visit Number 8    Number of Visits 16    Date for PT Re-Evaluation 04/09/22    Authorization Type None    Authorization Time Period FOTO v6, v10    PT Start Time 0628    PT Stop Time 0709    PT Time Calculation (min) 41 min    Activity Tolerance Patient tolerated treatment well    Behavior During Therapy WFL for tasks assessed/performed               Past Medical History:  Diagnosis Date   Arthritis    Breast cancer ([place])    Cancer ([place])    breast   Diabetes mellitus without complication ([place])    History of MRI    Knee, Hip, and Back.   Hypertension    Personal history of chemotherapy    Personal history of radiation therapy    Past Surgical History:  Procedure Laterality Date   BREAST BIOPSY Right 02/03/2020   x2   BREAST BIOPSY Right 02/06/2020   x2   BREAST LUMPECTOMY Right 07/26/2020   BREAST LUMPECTOMY WITH RADIOACTIVE SEED AND SENTINEL LYMPH NODE BIOPSY Right 07/26/2020   Procedure: RIGHT BREAST LUMPECTOMY WITH RADIOACTIVE SEED X 2 AND SENTINEL LYMPH NODE BIOPSY;  Surgeon: Erroll Luna, MD;  Location: [facility];  Service: General;  Laterality: Right;   IR IMAGING GUIDED PORT INSERTION  02/23/2020   PORT-A-CATH REMOVAL N/A 07/26/2020   Procedure: REMOVAL PORT-A-CATH;  Surgeon: Erroll Luna, MD;  Location: [place] [facility];  Service: General;  Laterality: N/A;   TOTAL KNEE ARTHROPLASTY Left 11/18/2021   Procedure: LEFT TOTAL KNEE ARTHROPLASTY;  Surgeon: Leandrew Koyanagi, MD;  Location: [facility];  Service: Orthopedics;  Laterality: Left;   Patient Active Problem List   Diagnosis Date Noted   Status post total left knee replacement 11/18/2021    Primary osteoarthritis of left knee 08/06/2021   Aortic atherosclerosis ([place]) 04/24/2020   Deep venous thrombosis of left upper extremity ([place]) 04/24/2020   Port-A-Cath in place 03/26/2020   Malignant neoplasm of upper-outer quadrant of right breast in female, estrogen receptor positive ([place]) 02/13/2020    REFERRING DIAG: T62.263 (ICD-10-CM) - S/P total knee arthroplasty, left  THERAPY DIAG:  Chronic pain of left knee - Plan: PT plan of care cert/re-cert  Localized edema - Plan: PT plan of care cert/re-cert  Difficulty in walking, not elsewhere classified - Plan: PT plan of care cert/re-cert  Muscle weakness (generalized) - Plan: PT plan of care cert/re-cert  Rationale for Evaluation and Treatment Rehabilitation  PERTINENT HISTORY: Lt TKA on 11/18/2021 with Dr. Frankey Shown, DMII, HTN, Hx of breast cx with chemo/ radiation/ surgery (in remission)  PRECAUTIONS: Knee and Fall  ONSET DATE: 11/18/2021  SUBJECTIVE: Pt reports adherence to her HEP. She rates her Lt knee pain as 5/10. She reports continued stiffness following sitting.   PAIN:  Are you having pain? Yes: NPRS scale: 5/10 Lt knee Pain location: Lt knee Pain description: Achy, sharp Aggravating factors: walking >5 minutes with FWW, standing after prolonged sitting, and bending knee Relieving factors: ice, elevation, and pain medication   OBJECTIVE: (objective measures completed at initial evaluation unless otherwise dated)  DIAGNOSTIC FINDINGS: N/A   PATIENT SURVEYS:  FOTO 38%, predicted 61% in 13 visits  02/05/2022: 40%   COGNITION:           Overall cognitive status: Within functional limits for tasks assessed                          SENSATION: Light touch: Lt L2-S2 dermatomes impaired vs Rt   EDEMA:  Circumferential: 46.5cm on Rt, 52cm on Lt     POSTURE: flexed trunk  and weight shift right   PALPATION: TTP along Lt surgical incision   LOWER EXTREMITY ROM:   A/PROM Right eval Left eval  12/24/21 Left 12/26/21 Left 12/31/21 Left 01/02/2022 Left 02/05/2022  Knee flexion 110 80p!,90p! 100/109 p! 105/110 p! 106/111p! 107, 115p! PROM following MET 109, 116p!  Knee extension 2 -9p!, -5p! -3   -4 0/ 1p!   (Blank rows = not tested)   LOWER EXTREMITY MMT:   MMT Right eval Left eval Right 02/05/2022 Left 02/05/2022  Hip flexion 4/5 3/5 4+/5 4+/5  Hip extension 3/5 3/5 4/5 4/5  Hip abduction 3+/5 3/5 4/5, p! In Rt knee 4/5  Knee flexion 5/5 3/p! 5/5 5/5  Knee extension 3+/5p! 2+/5p! 4+/5p! 5/5p!   (Blank rows = not tested)     FUNCTIONAL TESTS:  5xSTS: 29 seconds SLR: roughly 10 degrees of quad lag throughout motion  02/05/2022: 5xSTS: 20 seconds with UE support SLR: WNL x10   GAIT: Distance walked: 71f Assistive device utilized: WEnvironmental consultant- 2 wheeled Level of assistance: Modified independence Comments: Decreased gait speed, Lt antalgic gait with forward posture       TODAY'S TREATMENT:  [facility] Adult PT Treatment:                                                DATE: 02/05/2022 Therapeutic Exercise: Stationary bike x5 min while collecting subjective information SLR with hip abduction with 2# ankle weight 2x10 on Lt Supine Lt knee flexion AAROM with green strap 2x10 with 5-sec hold Seated BIL active hamstring stretch x248m  Eccentric heel raises on 2-inch step with UE support 3x10 Standing slant board gastroc stretch [id number] Therapy: N/A Neuromuscular re-ed: N/A Therapeutic Activity: Re-assessment of objective information with pt education Re-administration of FOTO with pt education Mini deadlift with 10# kettlebell 3x10 Modalities: N/A Self Care: N/A   [facility] Adult PT Treatment:                                                DATE: 01/28/2022 Therapeutic Exercise: Nu step - L5 5 min - seat 9  Standing slant board stretch - 2' Heel raise 3x15 Knee flexion stretch on step - 20x 4'' step up fwd and lat - 2x10 ea LAQ 5# 4x10 Lt Seated marching 5# 3x10   Lt SLR 4x10 Sidelying hip abduction 3x10 Lt Supine hip adduction ball squeeze 3x10 with 3-sec hold  Balance Tandem stance 45'' bouts  Ice: 10 min  [facility] Adult PT Treatment:  DATE: 01/16/2022 Therapeutic Exercise: LAQ 3# 3x10 Lt Seated marching 3# 3x10  Supine Lt knee flexion AAROM with green strap 2x10 with 5-sec hold Lt SLR 3x10 Sidelying hip abduction 2x10 Lt, x10 Rt (too painful) Supine hip adduction ball squeeze 3x10 with 3-sec hold Seated active hamstring stretch 2x1' BIL Modalities: Vasopneumatic (Game Ready)   Location:  RIGHT knee Time:  10 minutes Pressure:  low Temperature:  34 degrees Cold pack on LEFT knee x10 mins      PATIENT EDUCATION:  Education details: Pt educated on prognosis, POC, FOTO, and HEP Person educated: Patient Education method: Consulting civil engineer, Media planner, and Handouts Education comprehension: verbalized understanding and returned demonstration     HOME EXERCISE PROGRAM: Access Code: [email or web address] URL: [email or web address] Date: 12/06/2021 Prepared by: Vanessa Clayton   Exercises - Straight Leg Raise  - 2 x daily - 7 x weekly - 3 sets - 10 reps - 3-sec hold - Heel slide with strap, LAYING ON BACK  - 2 x daily - 7 x weekly - 3 sets - 10 reps - 5-sec hold - Seated Hamstring Stretch  - 2 x daily - 7 x weekly - 2 sets - 1-min hold - Step Up  - 2 x daily - 7 x weekly - 3 sets - 20 reps - 4 Way Patellar Glide  - 2 x daily - 7 x weekly - 2 sets - 10 reps   ASSESSMENT:   CLINICAL IMPRESSION: Pt continues to progress with PT, demonstrating good form and mild increase in pain with exercises today. She was instructed to continue with RICE when her knees swells and becomes stiff. Upon re-assessment of pt goals, the pt continues to make progress with Lt knee AROM, FOTO score, 5xSTS, and global BIL LE strength. She will continue to benefit from skilled PT to address her primary impairments and  return to her prior level of function with less limitation.   OBJECTIVE IMPAIRMENTS Abnormal gait, decreased balance, decreased coordination, decreased endurance, decreased knowledge of use of DME, decreased mobility, difficulty walking, decreased ROM, decreased strength, hypomobility, increased edema, impaired flexibility, improper body mechanics, postural dysfunction, and pain.    ACTIVITY LIMITATIONS carrying, lifting, bending, sitting, standing, squatting, sleeping, stairs, transfers, bed mobility, toileting, dressing, and locomotion level   PARTICIPATION LIMITATIONS: cleaning, laundry, interpersonal relationship, driving, shopping, community activity, occupation, and yard work   PERSONAL FACTORS 3+ comorbidities: See medical hx  are also affecting patient's functional outcome.        GOALS: Goals reviewed with patient? Yes   SHORT TERM GOALS: Target date: 01/03/2022  Pt will report understanding and adherence to initial HEP in order to promote independence in the management of primary impairments. Baseline: HEP provided at eval Goal status: MET Pt reports adherence 12/31/21   2.  Pt will achieve Lt knee AROM of 0-100 degrees in order to progress to future stage of post-op protocol. Baseline: -9-80 02/05/2022: 0-109 Goal status: ACHIEVED   3.  Pt will achieve 10 SLR with no quad lag in order to progress to future stage of post-op protocol. Baseline: 10 degrees of quad lag with 1 rep 02/05/2022: WNL x10 Goal status: ACHIEVED     LONG TERM GOALS: Target date: 01/31/2022    Pt will achieve FOTO score of 61% in order to demonstrate improved functional ability as it relates to her primary knee impairments. Baseline: 38% 02/05/2022: 40% Goal status: IN PROGRESS   2.  Pt will achieve Lt knee AROM of 0-120 degrees in order to establish a  normal gait pattern for community ambulation. Baseline: -9-80 02/05/2022: 0-109 Goal status: IN PROGRESS   3.  Pt will achieve global BIL LE  strength of 4+/5 or greater in order to progress her independent LE strengthening regimen with less limitation. Baseline: See MMT chart 02/05/2022: See updated MMT chart Goal status: IN PROGRESS   4.  Pt will report ability to walk > 20 minutes without AD in order to grocery shop with less limitation. Baseline: 5 minutes with FWW 02/05/2022: 5 minutes without AD Goal status: IN PROGRESS   5.  Pt will achieve a 5xSTS in 14 seconds or less without UE support in order to demonstrate safe transfers. Baseline: 29 seconds with UE support  02/05/2022: 20 seconds with UE support Goal status: IN PROGRESS       PLAN: PT FREQUENCY: 1x/week   PT DURATION: 8 weeks   PLANNED INTERVENTIONS: Therapeutic exercises, Therapeutic activity, Neuromuscular re-education, Balance training, Gait training, Patient/Family education, Self Care, Joint mobilization, Stair training, Orthotic/Fit training, DME instructions, Aquatic Therapy, Dry Needling, Electrical stimulation, Cryotherapy, Taping, Vasopneumatic device, Biofeedback, Ionotophoresis 44m/ml Dexamethasone, Manual therapy, and Re-evaluation   PLAN FOR NEXT SESSION: Progress early knee strengthening/ ROM in accordance with post-op protocol   YVanessa  PT, DPT 02/05/22 7:09 PM

## 2022-02-06 ENCOUNTER — Ambulatory Visit: Payer: No Typology Code available for payment source | Admitting: Family

## 2022-02-11 ENCOUNTER — Ambulatory Visit: Payer: No Typology Code available for payment source

## 2022-02-11 ENCOUNTER — Encounter: Payer: Self-pay | Admitting: Orthopaedic Surgery

## 2022-02-11 ENCOUNTER — Ambulatory Visit (INDEPENDENT_AMBULATORY_CARE_PROVIDER_SITE_OTHER): Payer: Self-pay | Admitting: Orthopaedic Surgery

## 2022-02-11 DIAGNOSIS — Z96652 Presence of left artificial knee joint: Secondary | ICD-10-CM

## 2022-02-11 DIAGNOSIS — M1711 Unilateral primary osteoarthritis, right knee: Secondary | ICD-10-CM

## 2022-02-11 MED ORDER — AMOXICILLIN 500 MG PO CAPS: 2000.0000 mg | ORAL_CAPSULE | Freq: Once | ORAL | 6 refills | Status: AC

## 2022-02-11 NOTE — Progress Notes (Signed)
Post-Op Visit Note   [name]           Date of Birth: July 26, 1955           MRN: [id number] Visit Date: 02/11/2022 PCP: Sandrea Hughs, NP   Assessment & Plan:  Chief Complaint:  Chief Complaint  Patient presents with   Left Knee - Pain    Left total knee arthroplasty 11/18/2021   Visit Diagnoses:  1. Status post total left knee replacement   2. Primary osteoarthritis of right knee     Plan: Patient is 3 months status post left total knee replacement.  She is still outpatient PT.  She is making excellent progress.  Main complaint is numbness to the lateral knee and swelling in the leg.  Examination left lower extremity shows a fully healed surgical scar.  2+ pitting edema.  Negative Homans.  Range of motion is progressing well.  According to the PT notes she is regaining her range of motion well.  She is almost 120 degrees of flexion.  She will continue to do outpatient PT.  She will wear compression socks during the day.  Dental prophylaxis reinforced.  Recheck in 3 months with two-view x-rays of the left knee.  Follow-Up Instructions: Return in about 3 months (around 05/14/2022).   Orders:  No orders of the defined types were placed in this encounter.  Meds ordered this encounter  Medications   amoxicillin (AMOXIL) 500 MG capsule    Sig: Take 4 capsules (2,000 mg total) by mouth once for 1 dose.    Dispense:  4 capsule    Refill:  6    Imaging: No results found.  PMFS History: Patient Active Problem List   Diagnosis Date Noted   Primary osteoarthritis of right knee 02/11/2022   Status post total left knee replacement 11/18/2021   Primary osteoarthritis of left knee 08/06/2021   Aortic atherosclerosis ([place]) 04/24/2020   Deep venous thrombosis of left upper extremity ([place]) 04/24/2020   Port-A-Cath in place 03/26/2020   Malignant neoplasm of upper-outer quadrant of right breast in female, estrogen receptor positive ([place]) 02/13/2020   Past Medical  History:  Diagnosis Date   Arthritis    Breast cancer ([place])    Cancer ([place])    breast   Diabetes mellitus without complication ([place])    History of MRI    Knee, Hip, and Back.   Hypertension    Personal history of chemotherapy    Personal history of radiation therapy     Family History  Problem Relation Age of Onset   Asthma Mother    Arthritis Sister    Asthma Brother    Arthritis Brother     Past Surgical History:  Procedure Laterality Date   BREAST BIOPSY Right 02/03/2020   x2   BREAST BIOPSY Right 02/06/2020   x2   BREAST LUMPECTOMY Right 07/26/2020   BREAST LUMPECTOMY WITH RADIOACTIVE SEED AND SENTINEL LYMPH NODE BIOPSY Right 07/26/2020   Procedure: RIGHT BREAST LUMPECTOMY WITH RADIOACTIVE SEED X 2 AND SENTINEL LYMPH NODE BIOPSY;  Surgeon: Erroll Luna, MD;  Location: [facility];  Service: General;  Laterality: Right;   IR IMAGING GUIDED PORT INSERTION  02/23/2020   PORT-A-CATH REMOVAL N/A 07/26/2020   Procedure: REMOVAL PORT-A-CATH;  Surgeon: Erroll Luna, MD;  Location: [facility];  Service: General;  Laterality: N/A;   TOTAL KNEE ARTHROPLASTY Left 11/18/2021   Procedure: LEFT TOTAL KNEE ARTHROPLASTY;  Surgeon: Leandrew Koyanagi, MD;  Location: [facility];  Service: Orthopedics;  Laterality: Left;   Social History   Occupational History   Not on file  Tobacco Use   Smoking status: Never   Smokeless tobacco: Never  Vaping Use   Vaping Use: Never used  Substance and Sexual Activity   Alcohol use: Yes    Alcohol/week: 1.0 standard drink of alcohol    Types: 1 Glasses of wine per week    Comment: occasionally   Drug use: Never   Sexual activity: Not Currently

## 2022-02-12 ENCOUNTER — Ambulatory Visit: Payer: No Typology Code available for payment source | Attending: Physician Assistant

## 2022-02-12 DIAGNOSIS — M25562 Pain in left knee: Secondary | ICD-10-CM | POA: Insufficient documentation

## 2022-02-12 DIAGNOSIS — R600 Localized edema: Secondary | ICD-10-CM | POA: Insufficient documentation

## 2022-02-12 DIAGNOSIS — R262 Difficulty in walking, not elsewhere classified: Secondary | ICD-10-CM | POA: Insufficient documentation

## 2022-02-12 DIAGNOSIS — G8929 Other chronic pain: Secondary | ICD-10-CM | POA: Insufficient documentation

## 2022-02-12 DIAGNOSIS — M6281 Muscle weakness (generalized): Secondary | ICD-10-CM | POA: Insufficient documentation

## 2022-02-12 NOTE — Therapy (Signed)
OUTPATIENT PHYSICAL THERAPY TREATMENT NOTE/ RE-CERTIFICATION   Patient Name: [name] [name] MRN: [id number] DOB:[birth year], 66 y.o., female Today's Date: 02/12/2022  PCP: Sandrea Hughs, NP REFERRING PROVIDER: Aundra Dubin, PA-C  END OF SESSION:   PT End of Session - 02/12/22 1828     Visit Number 9    Number of Visits 16    Date for PT Re-Evaluation 04/09/22    Authorization Type None    Authorization Time Period FOTO v6, v10    PT Start Time 1828    PT Stop Time 1908    PT Time Calculation (min) 40 min    Activity Tolerance Patient tolerated treatment well    Behavior During Therapy WFL for tasks assessed/performed                Past Medical History:  Diagnosis Date   Arthritis    Breast cancer ([place])    Cancer ([place])    breast   Diabetes mellitus without complication ([place])    History of MRI    Knee, Hip, and Back.   Hypertension    Personal history of chemotherapy    Personal history of radiation therapy    Past Surgical History:  Procedure Laterality Date   BREAST BIOPSY Right 02/03/2020   x2   BREAST BIOPSY Right 02/06/2020   x2   BREAST LUMPECTOMY Right 07/26/2020   BREAST LUMPECTOMY WITH RADIOACTIVE SEED AND SENTINEL LYMPH NODE BIOPSY Right 07/26/2020   Procedure: RIGHT BREAST LUMPECTOMY WITH RADIOACTIVE SEED X 2 AND SENTINEL LYMPH NODE BIOPSY;  Surgeon: Erroll Luna, MD;  Location: [facility];  Service: General;  Laterality: Right;   IR IMAGING GUIDED PORT INSERTION  02/23/2020   PORT-A-CATH REMOVAL N/A 07/26/2020   Procedure: REMOVAL PORT-A-CATH;  Surgeon: Erroll Luna, MD;  Location: [place];  Service: General;  Laterality: N/A;   TOTAL KNEE ARTHROPLASTY Left 11/18/2021   Procedure: LEFT TOTAL KNEE ARTHROPLASTY;  Surgeon: Leandrew Koyanagi, MD;  [facility];  Service: Orthopedics;  Laterality: Left;   Patient Active Problem List   Diagnosis Date Noted   Primary osteoarthritis of right knee 02/11/2022    Status post total left knee replacement 11/18/2021   Primary osteoarthritis of left knee 08/06/2021   Aortic atherosclerosis ([place]) 04/24/2020   Deep venous thrombosis of left upper extremity ([place]) 04/24/2020   Port-A-Cath in place 03/26/2020   Malignant neoplasm of upper-outer quadrant of right breast in female, estrogen receptor positive ([employer]) 02/13/2020    REFERRING DIAG: Z48.270 (ICD-10-CM) - S/P total knee arthroplasty, left  THERAPY DIAG:  Chronic pain of left knee  Localized edema  Difficulty in walking, not elsewhere classified  Muscle weakness (generalized)  Rationale for Evaluation and Treatment Rehabilitation  PERTINENT HISTORY: Lt TKA on 11/18/2021 with Dr. Frankey Shown, DMII, HTN, Hx of breast cx with chemo/ radiation/ surgery (in remission)  PRECAUTIONS: Knee and Fall  ONSET DATE: 11/18/2021  SUBJECTIVE: Pt reports 3-4/10 Lt knee pain today, adding that she can tell continued progress. She also states her orthopedic doctor gave her a good report concerning her knee.   PAIN:  Are you having pain? Yes: NPRS scale: 3-4/10 Lt knee Pain location: Lt knee Pain description: Achy, sharp Aggravating factors: walking >5 minutes with FWW, standing after prolonged sitting, and bending knee Relieving factors: ice, elevation, and pain medication   OBJECTIVE: (objective measures completed at initial evaluation unless otherwise dated)   DIAGNOSTIC FINDINGS: N/A   PATIENT SURVEYS:  FOTO 38%, predicted  61% in 13 visits  02/05/2022: 40%   COGNITION:           Overall cognitive status: Within functional limits for tasks assessed                          SENSATION: Light touch: Lt L2-S2 dermatomes impaired vs Rt   EDEMA:  Circumferential: 46.5cm on Rt, 52cm on Lt     POSTURE: flexed trunk  and weight shift right   PALPATION: TTP along Lt surgical incision   LOWER EXTREMITY ROM:   A/PROM Right eval Left eval 12/24/21 Left 12/26/21 Left 12/31/21 Left 01/02/2022 Left  02/05/2022  Knee flexion 110 80p!,90p! 100/109 p! 105/110 p! 106/111p! 107, 115p! PROM following MET 109, 116p!  Knee extension 2 -9p!, -5p! -3   -4 0/ 1p!   (Blank rows = not tested)   LOWER EXTREMITY MMT:   MMT Right eval Left eval Right 02/05/2022 Left 02/05/2022  Hip flexion 4/5 3/5 4+/5 4+/5  Hip extension 3/5 3/5 4/5 4/5  Hip abduction 3+/5 3/5 4/5, p! In Rt knee 4/5  Knee flexion 5/5 3/p! 5/5 5/5  Knee extension 3+/5p! 2+/5p! 4+/5p! 5/5p!   (Blank rows = not tested)     FUNCTIONAL TESTS:  5xSTS: 29 seconds SLR: roughly 10 degrees of quad lag throughout motion  02/05/2022: 5xSTS: 20 seconds with UE support SLR: WNL x10   GAIT: Distance walked: 32f Assistive device utilized: WEnvironmental consultant- 2 wheeled Level of assistance: Modified independence Comments: Decreased gait speed, Lt antalgic gait with forward posture       TODAY'S TREATMENT:  [facility] Adult PT Treatment:                                                DATE: 02/12/2022 Therapeutic Exercise: Stationary bike x5 min while collecting subjective information Mini Bulgarian split squat with UE support x10 BIL Seated BIL knee extension machine with 20# from 90d flexion to 30d flexion 3x12 Forward lunge to each side of Bosu ball 2x10 each BIL Seated hamstring curls with 25# 3x12 Squats with two 7# cables into waist attachment with UE support 3x8 Mini squat side steps with 7# cable to waist attachment 2x5 walkouts BIL Prone quad stretch with sheet x259m BIL Prone alternating hip extension 2x10 BIL Manual Therapy: N/A Neuromuscular re-ed: N/A Therapeutic Activity: N/A Modalities: N/A Self Care: N/A   [facility] Adult PT Treatment:                                                DATE: 02/05/2022 Therapeutic Exercise: Stationary bike x5 min while collecting subjective information SLR with hip abduction with 2# ankle weight 2x10 on Lt Supine Lt knee flexion AAROM with green strap 2x10 with 5-sec hold Seated BIL active  hamstring stretch x2m41m Eccentric heel raises on 2-inch step with UE support 3x10 Standing slant board gastroc stretch [id number] Therapy: N/A Neuromuscular re-ed: N/A Therapeutic Activity: Re-assessment of objective information with pt education Re-administration of FOTO with pt education Mini deadlift with 10# kettlebell 3x10 Modalities: N/A Self Care: N/A   [facility] Adult PT Treatment:  DATE: 01/28/2022 Therapeutic Exercise: Nu step - L5 5 min - seat 9  Standing slant board stretch - 2' Heel raise 3x15 Knee flexion stretch on step - 20x 4'' step up fwd and lat - 2x10 ea LAQ 5# 4x10 Lt Seated marching 5# 3x10  Lt SLR 4x10 Sidelying hip abduction 3x10 Lt Supine hip adduction ball squeeze 3x10 with 3-sec hold  Balance Tandem stance 45'' bouts  Ice: 10 min     PATIENT EDUCATION:  Education details: Pt educated on prognosis, POC, FOTO, and HEP Person educated: Patient Education method: Consulting civil engineer, Media planner, and Handouts Education comprehension: verbalized understanding and returned demonstration     HOME EXERCISE PROGRAM: Access Code: [email or web address] URL: [email or web address] Date: 12/06/2021 Prepared by: Vanessa [employer]   Exercises - Straight Leg Raise  - 2 x daily - 7 x weekly - 3 sets - 10 reps - 3-sec hold - Heel slide with strap, LAYING ON BACK  - 2 x daily - 7 x weekly - 3 sets - 10 reps - 5-sec hold - Seated Hamstring Stretch  - 2 x daily - 7 x weekly - 2 sets - 1-min hold - Step Up  - 2 x daily - 7 x weekly - 3 sets - 20 reps - 4 Way Patellar Glide  - 2 x daily - 7 x weekly - 2 sets - 10 reps   ASSESSMENT:   CLINICAL IMPRESSION: Pt continues to progress well with more advanced exercises in PT; she demonstrates good form and no pain with updated exercises today. The pt will continue to benefit from skilled PT to address her primary impairments and return to her prior level of function with  less limitation.   OBJECTIVE IMPAIRMENTS Abnormal gait, decreased balance, decreased coordination, decreased endurance, decreased knowledge of use of DME, decreased mobility, difficulty walking, decreased ROM, decreased strength, hypomobility, increased edema, impaired flexibility, improper body mechanics, postural dysfunction, and pain.    ACTIVITY LIMITATIONS carrying, lifting, bending, sitting, standing, squatting, sleeping, stairs, transfers, bed mobility, toileting, dressing, and locomotion level   PARTICIPATION LIMITATIONS: cleaning, laundry, interpersonal relationship, driving, shopping, community activity, occupation, and yard work   PERSONAL FACTORS 3+ comorbidities: See medical hx  are also affecting patient's functional outcome.        GOALS: Goals reviewed with patient? Yes   SHORT TERM GOALS: Target date: 01/03/2022  Pt will report understanding and adherence to initial HEP in order to promote independence in the management of primary impairments. Baseline: HEP provided at eval Goal status: MET Pt reports adherence 12/31/21   2.  Pt will achieve Lt knee AROM of 0-100 degrees in order to progress to future stage of post-op protocol. Baseline: -9-80 02/05/2022: 0-109 Goal status: ACHIEVED   3.  Pt will achieve 10 SLR with no quad lag in order to progress to future stage of post-op protocol. Baseline: 10 degrees of quad lag with 1 rep 02/05/2022: WNL x10 Goal status: ACHIEVED     LONG TERM GOALS: Target date: 01/31/2022    Pt will achieve FOTO score of 61% in order to demonstrate improved functional ability as it relates to her primary knee impairments. Baseline: 38% 02/05/2022: 40% Goal status: IN PROGRESS   2.  Pt will achieve Lt knee AROM of 0-120 degrees in order to establish a normal gait pattern for community ambulation. Baseline: -9-80 02/05/2022: 0-109 Goal status: IN PROGRESS   3.  Pt will achieve global BIL LE strength of 4+/5 or greater in order to progress  her independent LE  strengthening regimen with less limitation. Baseline: See MMT chart 02/05/2022: See updated MMT chart Goal status: IN PROGRESS   4.  Pt will report ability to walk > 20 minutes without AD in order to grocery shop with less limitation. Baseline: 5 minutes with FWW 02/05/2022: 5 minutes without AD Goal status: IN PROGRESS   5.  Pt will achieve a 5xSTS in 14 seconds or less without UE support in order to demonstrate safe transfers. Baseline: 29 seconds with UE support  02/05/2022: 20 seconds with UE support Goal status: IN PROGRESS       PLAN: PT FREQUENCY: 1x/week   PT DURATION: 8 weeks   PLANNED INTERVENTIONS: Therapeutic exercises, Therapeutic activity, Neuromuscular re-education, Balance training, Gait training, Patient/Family education, Self Care, Joint mobilization, Stair training, Orthotic/Fit training, DME instructions, Aquatic Therapy, Dry Needling, Electrical stimulation, Cryotherapy, Taping, Vasopneumatic device, Biofeedback, Ionotophoresis 10m/ml Dexamethasone, Manual therapy, and Re-evaluation   PLAN FOR NEXT SESSION: Progress early knee strengthening/ ROM in accordance with post-op protocol   YVanessa DeWitt PT, DPT 02/12/22 7:08 PM

## 2022-02-19 ENCOUNTER — Other Ambulatory Visit: Payer: No Typology Code available for payment source

## 2022-02-19 ENCOUNTER — Ambulatory Visit: Payer: No Typology Code available for payment source | Admitting: Hematology and Oncology

## 2022-02-19 ENCOUNTER — Ambulatory Visit: Payer: No Typology Code available for payment source

## 2022-02-19 DIAGNOSIS — M6281 Muscle weakness (generalized): Secondary | ICD-10-CM

## 2022-02-19 DIAGNOSIS — R262 Difficulty in walking, not elsewhere classified: Secondary | ICD-10-CM

## 2022-02-19 DIAGNOSIS — G8929 Other chronic pain: Secondary | ICD-10-CM

## 2022-02-19 DIAGNOSIS — R600 Localized edema: Secondary | ICD-10-CM

## 2022-02-19 NOTE — Therapy (Signed)
OUTPATIENT PHYSICAL THERAPY TREATMENT NOTE/ RE-CERTIFICATION   Patient Name: [name] [name] MRN: [id number] DOB:02-23-1956, 66 y.o., female Today's Date: 02/19/2022  PCP: Sandrea Hughs, NP REFERRING PROVIDER: Aundra Dubin, PA-C  END OF SESSION:   PT End of Session - 02/19/22 1825     Visit Number 10    Number of Visits 16    Date for PT Re-Evaluation 04/09/22    Authorization Type None    Authorization Time Period FOTO v6, v10    PT Start Time 1828    PT Stop Time 1908    PT Time Calculation (min) 40 min    Activity Tolerance Patient tolerated treatment well    Behavior During Therapy WFL for tasks assessed/performed                 Past Medical History:  Diagnosis Date   Arthritis    Breast cancer ([place])    Cancer ([place])    breast   Diabetes mellitus without complication ([place])    History of MRI    Knee, Hip, and Back.   Hypertension    Personal history of chemotherapy    Personal history of radiation therapy    Past Surgical History:  Procedure Laterality Date   BREAST BIOPSY Right 02/03/2020   x2   BREAST BIOPSY Right 02/06/2020   x2   BREAST LUMPECTOMY Right 07/26/2020   BREAST LUMPECTOMY WITH RADIOACTIVE SEED AND SENTINEL LYMPH NODE BIOPSY Right 07/26/2020   Procedure: RIGHT BREAST LUMPECTOMY WITH RADIOACTIVE SEED X 2 AND SENTINEL LYMPH NODE BIOPSY;  Surgeon: Erroll Luna, MD;  Location: [facility];  Service: General;  Laterality: Right;   IR IMAGING GUIDED PORT INSERTION  02/23/2020   PORT-A-CATH REMOVAL N/A 07/26/2020   Procedure: REMOVAL PORT-A-CATH;  Surgeon: Erroll Luna, MD;  Location: [facility];  Service: General;  Laterality: N/A;   TOTAL KNEE ARTHROPLASTY Left 11/18/2021   Procedure: LEFT TOTAL KNEE ARTHROPLASTY;  Surgeon: Leandrew Koyanagi, MD;  Location: [facility];  Service: Orthopedics;  Laterality: Left;   Patient Active Problem List   Diagnosis Date Noted   Primary osteoarthritis of right knee 02/11/2022    Status post total left knee replacement 11/18/2021   Primary osteoarthritis of left knee 08/06/2021   Aortic atherosclerosis ([place]) 04/24/2020   Deep venous thrombosis of left upper extremity ([place]) 04/24/2020   Port-A-Cath in place 03/26/2020   Malignant neoplasm of upper-outer quadrant of right breast in female, estrogen receptor positive ([place]) 02/13/2020    REFERRING DIAG: O17.510 (ICD-10-CM) - S/P total knee arthroplasty, left  THERAPY DIAG:  Chronic pain of left knee  Localized edema  Difficulty in walking, not elsewhere classified  Muscle weakness (generalized)  Rationale for Evaluation and Treatment Rehabilitation  PERTINENT HISTORY: Lt TKA on 11/18/2021 with Dr. Frankey Shown, DMII, HTN, Hx of breast cx with chemo/ radiation/ surgery (in remission)  PRECAUTIONS: Knee and Fall  ONSET DATE: 11/18/2021  SUBJECTIVE: Pt reports continued 3/10 Lt knee pain today. She reports adherence to her HEP.  PAIN:  Are you having pain? Yes: NPRS scale: 3-4/10 Lt knee Pain location: Lt knee Pain description: Achy, sharp Aggravating factors: walking >5 minutes with FWW, standing after prolonged sitting, and bending knee Relieving factors: ice, elevation, and pain medication   OBJECTIVE: (objective measures completed at initial evaluation unless otherwise dated)   DIAGNOSTIC FINDINGS: N/A   PATIENT SURVEYS:  FOTO 38%, predicted 61% in 13 visits  02/05/2022: 40%  02/19/2022: 57%   COGNITION:  Overall cognitive status: Within functional limits for tasks assessed                          SENSATION: Light touch: Lt L2-S2 dermatomes impaired vs Rt   EDEMA:  Circumferential: 46.5cm on Rt, 52cm on Lt     POSTURE: flexed trunk  and weight shift right   PALPATION: TTP along Lt surgical incision   LOWER EXTREMITY ROM:   A/PROM Right eval Left eval 12/24/21 Left 12/26/21 Left 12/31/21 Left 01/02/2022 Left 02/05/2022 Left 02/19/2022 Left  Knee flexion 110 80p!,90p!  100/109 p! 105/110 p! 106/111p! 107, 115p! PROM following MET 109, 116p! 114, 121p!  Knee extension 2 -9p!, -5p! -3   -4 0/ 1p! 0, 2p!   (Blank rows = not tested)   LOWER EXTREMITY MMT:   MMT Right eval Left eval Right 02/05/2022 Left 02/05/2022  Hip flexion 4/5 3/5 4+/5 4+/5  Hip extension 3/5 3/5 4/5 4/5  Hip abduction 3+/5 3/5 4/5, p! In Rt knee 4/5  Knee flexion 5/5 3/p! 5/5 5/5  Knee extension 3+/5p! 2+/5p! 4+/5p! 5/5p!   (Blank rows = not tested)     FUNCTIONAL TESTS:  5xSTS: 29 seconds SLR: roughly 10 degrees of quad lag throughout motion  02/05/2022: 5xSTS: 20 seconds with UE support SLR: WNL x10   GAIT: Distance walked: 35f Assistive device utilized: WEnvironmental consultant- 2 wheeled Level of assistance: Modified independence Comments: Decreased gait speed, Lt antalgic gait with forward posture       TODAY'S TREATMENT:  [facility] Adult PT Treatment:                                                DATE: 02/19/2022 Therapeutic Exercise: Supine Lt knee flexion AAROM with green strap 2x10 with 5-sec hold at end-range Seated knee extension from 90d flexion to 30d flexion with 15# 3x10 Seated hamstring curls with 0d flexion to 60d flexion with 35# 3x10 Standing Cybex hip abduction with 30# 2x10 BIL Seated BIL active hamstring stretch x23m Manual Therapy: N/A Neuromuscular re-ed: N/A Therapeutic Activity: Re-administration of FOTO with pt education Re-assessment of objective measures with pt education 4-inch forward step-down with slow lowering and backward step-up, 3x10 Lt stance leg Mini dead lift with 25# kettlebell 3x8 with cues for functional lifting technique Modalities: N/A Self Care: N/A   [facility] Adult PT Treatment:                                                DATE: 02/12/2022 Therapeutic Exercise: Stationary bike x5 min while collecting subjective information Mini [place] squat with UE support x10 BIL Seated BIL knee extension machine with 20# from 90d  flexion to 30d flexion 3x12 Forward lunge to each side of Bosu ball 2x10 each BIL Seated hamstring curls with 25# 3x12 Squats with two 7# cables into waist attachment with UE support 3x8 Mini squat side steps with 7# cable to waist attachment 2x5 walkouts BIL Prone quad stretch with sheet [id number] Prone alternating hip extension 2x10 BIL Manual Therapy: N/A Neuromuscular re-ed: N/A Therapeutic Activity: N/A Modalities: N/A Self Care: N/A   [facility] Adult PT Treatment:  DATE: 02/05/2022 Therapeutic Exercise: Stationary bike x5 min while collecting subjective information SLR with hip abduction with 2# ankle weight 2x10 on Lt Supine Lt knee flexion AAROM with green strap 2x10 with 5-sec hold Seated BIL active hamstring stretch x39mn  Eccentric heel raises on 2-inch step with UE support 3x10 Standing slant board gastroc stretch x272m Manual Therapy: N/A Neuromuscular re-ed: N/A Therapeutic Activity: Re-assessment of objective information with pt education Re-administration of FOTO with pt education Mini deadlift with 10# kettlebell 3x10 Modalities: N/A Self Care: N/A       PATIENT EDUCATION:  Education details: Pt educated on prognosis, POC, FOTO, and HEP Person educated: Patient Education method: ExConsulting civil engineerDeMedia plannerand Handouts Education comprehension: verbalized understanding and returned demonstration     HOME EXERCISE PROGRAM: Access Code: [email or web address]: [email or web address] Date: 12/06/2021 Prepared by: TuVanessa Patterson Exercises - Straight Leg Raise  - 2 x daily - 7 x weekly - 3 sets - 10 reps - 3-sec hold - Heel slide with strap, LAYING ON BACK  - 2 x daily - 7 x weekly - 3 sets - 10 reps - 5-sec hold - Seated Hamstring Stretch  - 2 x daily - 7 x weekly - 2 sets - 1-min hold - Step Up  - 2 x daily - 7 x weekly - 3 sets - 20 reps - 4 Way Patellar Glide  - 2 x daily - 7 x weekly - 2 sets  - 10 reps   ASSESSMENT:   CLINICAL IMPRESSION: Pt continues to progress well with PT, demonstrating good form and mild pain throughout the session. Pt continues to demonstrate moderate pain with knee flexion PROM and AAROM, although she demonstrated improved knee flexion AROM with these techniques. Pt will continue to benefit from skilled PT to address her primary impairments and return to her prior level of function with less limitation.    OBJECTIVE IMPAIRMENTS Abnormal gait, decreased balance, decreased coordination, decreased endurance, decreased knowledge of use of DME, decreased mobility, difficulty walking, decreased ROM, decreased strength, hypomobility, increased edema, impaired flexibility, improper body mechanics, postural dysfunction, and pain.    ACTIVITY LIMITATIONS carrying, lifting, bending, sitting, standing, squatting, sleeping, stairs, transfers, bed mobility, toileting, dressing, and locomotion level   PARTICIPATION LIMITATIONS: cleaning, laundry, interpersonal relationship, driving, shopping, community activity, occupation, and yard work   PERSONAL FACTORS 3+ comorbidities: See medical hx  are also affecting patient's functional outcome.        GOALS: Goals reviewed with patient? Yes   SHORT TERM GOALS: Target date: 01/03/2022  Pt will report understanding and adherence to initial HEP in order to promote independence in the management of primary impairments. Baseline: HEP provided at eval Goal status: MET Pt reports adherence 12/31/21   2.  Pt will achieve Lt knee AROM of 0-100 degrees in order to progress to future stage of post-op protocol. Baseline: -9-80 02/05/2022: 0-109 Goal status: ACHIEVED   3.  Pt will achieve 10 SLR with no quad lag in order to progress to future stage of post-op protocol. Baseline: 10 degrees of quad lag with 1 rep 02/05/2022: WNL x10 Goal status: ACHIEVED     LONG TERM GOALS: Target date: 01/31/2022    Pt will achieve FOTO score of  61% in order to demonstrate improved functional ability as it relates to her primary knee impairments. Baseline: 38% 02/05/2022: 40% 02/19/2022: 57% Goal status: IN PROGRESS   2.  Pt will achieve Lt knee AROM of 0-120 degrees in order to establish a normal  gait pattern for community ambulation. Baseline: -9-80 02/05/2022: 0-109 02/19/2022:  Goal status: IN PROGRESS   3.  Pt will achieve global BIL LE strength of 4+/5 or greater in order to progress her independent LE strengthening regimen with less limitation. Baseline: See MMT chart 02/05/2022: See updated MMT chart Goal status: IN PROGRESS   4.  Pt will report ability to walk > 20 minutes without AD in order to grocery shop with less limitation. Baseline: 5 minutes with FWW 02/05/2022: 5 minutes without AD Goal status: IN PROGRESS   5.  Pt will achieve a 5xSTS in 14 seconds or less without UE support in order to demonstrate safe transfers. Baseline: 29 seconds with UE support  02/05/2022: 20 seconds with UE support Goal status: IN PROGRESS       PLAN: PT FREQUENCY: 1x/week   PT DURATION: 8 weeks   PLANNED INTERVENTIONS: Therapeutic exercises, Therapeutic activity, Neuromuscular re-education, Balance training, Gait training, Patient/Family education, Self Care, Joint mobilization, Stair training, Orthotic/Fit training, DME instructions, Aquatic Therapy, Dry Needling, Electrical stimulation, Cryotherapy, Taping, Vasopneumatic device, Biofeedback, Ionotophoresis 43m/ml Dexamethasone, Manual therapy, and Re-evaluation   PLAN FOR NEXT SESSION: Progress early knee strengthening/ ROM in accordance with post-op protocol   YVanessa [place] PT, DPT 02/19/22 7:08 PM

## 2022-02-25 ENCOUNTER — Telehealth: Payer: Self-pay | Admitting: Pharmacy Technician

## 2022-02-25 NOTE — Telephone Encounter (Signed)
Oral Oncology Patient Advocate Encounter   Received notification that patient is due for re-enrollment for assistance for Ver[employer] through [employer].   Re-enrollment process has been initiated and will be submitted upon completion of necessary documents.  Patient to sign documents on 02/26/2022 at their next appointment.  [employer] number [phone].   I will continue to follow until final determination.  [name], CPhT-Adv Oncology Pharmacy Patient [place] Direct Number: [phone]  Fax: [phone]

## 2022-02-26 ENCOUNTER — Encounter: Payer: Self-pay | Admitting: Hematology and Oncology

## 2022-02-26 ENCOUNTER — Ambulatory Visit: Payer: No Typology Code available for payment source

## 2022-02-26 ENCOUNTER — Inpatient Hospital Stay: Payer: No Typology Code available for payment source | Attending: Hematology and Oncology

## 2022-02-26 ENCOUNTER — Other Ambulatory Visit: Payer: Self-pay

## 2022-02-26 ENCOUNTER — Inpatient Hospital Stay (HOSPITAL_BASED_OUTPATIENT_CLINIC_OR_DEPARTMENT_OTHER): Payer: No Typology Code available for payment source | Admitting: Hematology and Oncology

## 2022-02-26 DIAGNOSIS — Z170 Estrogen receptor positive status [ER+]: Secondary | ICD-10-CM | POA: Insufficient documentation

## 2022-02-26 DIAGNOSIS — R600 Localized edema: Secondary | ICD-10-CM

## 2022-02-26 DIAGNOSIS — C50411 Malignant neoplasm of upper-outer quadrant of right female breast: Secondary | ICD-10-CM

## 2022-02-26 DIAGNOSIS — R262 Difficulty in walking, not elsewhere classified: Secondary | ICD-10-CM

## 2022-02-26 DIAGNOSIS — Z923 Personal history of irradiation: Secondary | ICD-10-CM | POA: Insufficient documentation

## 2022-02-26 DIAGNOSIS — G8929 Other chronic pain: Secondary | ICD-10-CM

## 2022-02-26 DIAGNOSIS — Z79811 Long term (current) use of aromatase inhibitors: Secondary | ICD-10-CM | POA: Insufficient documentation

## 2022-02-26 DIAGNOSIS — M6281 Muscle weakness (generalized): Secondary | ICD-10-CM

## 2022-02-26 LAB — CMP (CANCER CENTER ONLY)
ALT: 24 U/L (ref 0–44)
AST: 25 U/L (ref 15–41)
Albumin: 4.2 g/dL (ref 3.5–5.0)
Alkaline Phosphatase: 92 U/L (ref 38–126)
Anion gap: 4 — ABNORMAL LOW (ref 5–15)
BUN: 10 mg/dL (ref 8–23)
CO2: 26 mmol/L (ref 22–32)
Calcium: 9.6 mg/dL (ref 8.9–10.3)
Chloride: 108 mmol/L (ref 98–111)
Creatinine: 1.06 mg/dL — ABNORMAL HIGH (ref 0.44–1.00)
GFR, Estimated: 59 mL/min — ABNORMAL LOW (ref >60)
Glucose, Bld: 94 mg/dL (ref 70–99)
Potassium: 4.4 mmol/L (ref 3.5–5.1)
Sodium: 138 mmol/L (ref 135–145)
Total Bilirubin: 0.3 mg/dL (ref 0.3–1.2)
Total Protein: 7.4 g/dL (ref 6.5–8.1)

## 2022-02-26 LAB — CBC WITH DIFFERENTIAL (CANCER CENTER ONLY)
Abs Immature Granulocytes: 0.01 10*3/uL (ref 0.00–0.07)
Basophils Absolute: 0 10*3/uL (ref 0.0–0.1)
Basophils Relative: 1 %
Eosinophils Absolute: 0.1 10*3/uL (ref 0.0–0.5)
Eosinophils Relative: 4 %
HCT: 28.8 % — ABNORMAL LOW (ref 36.0–46.0)
Hemoglobin: 9.8 g/dL — ABNORMAL LOW (ref 12.0–15.0)
Immature Granulocytes: 0 %
Lymphocytes Relative: 39 %
Lymphs Abs: 1.4 10*3/uL (ref 0.7–4.0)
MCH: 35.5 pg — ABNORMAL HIGH (ref 26.0–34.0)
MCHC: 34 g/dL (ref 30.0–36.0)
MCV: 104.3 fL — ABNORMAL HIGH (ref 80.0–100.0)
Monocytes Absolute: 0.2 10*3/uL (ref 0.1–1.0)
Monocytes Relative: 6 %
Neutro Abs: 1.9 10*3/uL (ref 1.7–7.7)
Neutrophils Relative %: 50 %
Platelet Count: 226 10*3/uL (ref 150–400)
RBC: 2.76 MIL/uL — ABNORMAL LOW (ref 3.87–5.11)
RDW: 14.3 % (ref 11.5–15.5)
WBC Count: 3.7 10*3/uL — ABNORMAL LOW (ref 4.0–10.5)
nRBC: 0 % (ref 0.0–0.2)

## 2022-02-26 NOTE — Progress Notes (Deleted)
[facility]  Telephone:([phone]) [phone] Fax:(336) [phone]     ID: [name] [name] DOB: Jul 17, 1955  MR#: [id number]  NTF#:[id number]  Patient Care Team: Caesar Bookman, NP as PCP - General (Family Medicine) Pershing Proud, RN as Oncology Nurse Navigator Donnelly Angelica, RN as Oncology Nurse Navigator Harriette Bouillon, MD as Consulting Physician (General Surgery) Lonie Peak, MD as Attending Physician (Radiation Oncology) Rachel Moulds, MD as Medical Oncologist (Hematology and Oncology) Anselm Lis, RPH-CPP as Pharmacist (Hematology and Oncology) Rachel Moulds, MD  OTHER MD:  CHIEF COMPLAINT: Estrogen receptor positive breast cancer  CURRENT TREATMENT: Anastrozole with abemaciclib.  INTERVAL HISTORY:  [name] [name] returns today for follow up of her estrogen receptor positive breast cancer.  She is on adjuvant anastrazole with abemaciclib. She said she ran out of anastrozole for the past 3/4 days.  Her pharmacy was trying to get a refill but she states that no one called her back. She said she didn't feel good, feels very cold, heartburn. She had some nausea and vomiting Appetite is not good. No change in breathing.  She feels like she has been tolerating this current dose well. She has noted some diarrhea as well, going 3/ 4 times a day, couple days of the week. Rest of the pertinent 10 point ROS reviewed and negative.    COVID 19 VACCINATION STATUS: s/p  [employer]  X2, most recently June 2021, no booster as of 05/01/2020   HISTORY OF CURRENT ILLNESS: From the original intake note:  Miniya Miguez herself palpated a mass in the right axilla "a long time ago", more recently with complaints of associated pain. She brought it to medical attention and underwent bilateral diagnostic mammography with tomography and bilateral breast ultrasonography at [facility] on 01/31/2020 showing: breast density category B; three right breast masses-- 1.9 cm at 12 o'clock, 2.3  cm at 11 o'clock, 0.4 cm at 1 o'clock; two adjacent enlarged thickened lymph nodes measuring up to 2.1 cm; no evidence of left breast malignancy.  Accordingly on 02/03/2020 she proceeded to biopsy of the right breast area at 12 o'clock and right axilla. The pathology from this procedure ([id number]) showed: invasive ductal carcinoma, grade 3. Prognostic indicators significant for: estrogen receptor, 100% positive with strong staining intensity and progesterone receptor, 0% negative. Proliferation marker Ki67 at 60%. HER2 equivocal by immunohistochemistry (2+), but negative by fluorescent in situ hybridization with a signals ratio 0.81 and number per cell 2.7.  The biopsied lymph node was found to show metastatic carcinoma. The morphology was considered different from the biopsied mass, and a prognostic panel was performed. Estrogen receptor, 100% positive with strong staining intensity and progesterone receptor, 0% negative. Proliferation marker Ki67 at 70%. HER2 equivcoal by immunohistochemistry (2+), but negative) by fluorescent in situ hybridization with a signals ratio 1.16 and number per cell 3.9.  She underwent additional right breast biopsies on 02/06/2020. Pathology [phone]) from the mass at 11 o'clock showed: invasive ductal carcinoma, grade 2. Prognostic indicators significant for: estrogen receptor, 100% positive with strong staining intensity and progesterone receptor, 0% negative. Proliferation marker Ki67 at 15%. HER2 equivocal by immunohistochemistry (2+), but negative by fluorescent in situ hybridization with a signals ratio 1.29 and number per cell 2.7.  The biopsied mass at 1 o'clock showed only fibrocystic change.  The patient's subsequent history is as detailed below.   PAST MEDICAL HISTORY: Past Medical History:  Diagnosis Date   Arthritis    Breast cancer (HCC)    Cancer (HCC)  breast   Diabetes mellitus without complication ([place])    History of MRI    Knee, Hip, and  Back.   Hypertension    Personal history of chemotherapy    Personal history of radiation therapy     PAST SURGICAL HISTORY: Past Surgical History:  Procedure Laterality Date   BREAST BIOPSY Right 02/03/2020   x2   BREAST BIOPSY Right 02/06/2020   x2   BREAST LUMPECTOMY Right 07/26/2020   BREAST LUMPECTOMY WITH RADIOACTIVE SEED AND SENTINEL LYMPH NODE BIOPSY Right 07/26/2020   Procedure: RIGHT BREAST LUMPECTOMY WITH RADIOACTIVE SEED X 2 AND SENTINEL LYMPH NODE BIOPSY;  Surgeon: Erroll Luna, MD;  Location: [facility];  Service: General;  Laterality: Right;   IR IMAGING GUIDED PORT INSERTION  02/23/2020   PORT-A-CATH REMOVAL N/A 07/26/2020   Procedure: REMOVAL PORT-A-CATH;  Surgeon: Erroll Luna, MD;  Location: [place];  Service: General;  Laterality: N/A;   TOTAL KNEE ARTHROPLASTY Left 11/18/2021   Procedure: LEFT TOTAL KNEE ARTHROPLASTY;  Surgeon: Leandrew Koyanagi, MD;  Location: [place];  Service: Orthopedics;  Laterality: Left;    FAMILY HISTORY: Family History  Problem Relation Age of Onset   Asthma Mother    Arthritis Sister    Asthma Brother    Arthritis Brother   [name] died in his 64s from causes unknown to the patient.  [name] died from heart disease at age 23.  The patient had 4 brothers, 1 sister, with no history of cancer in the family to her knowledge   GYNECOLOGIC HISTORY:  No LMP recorded. Patient is postmenopausal. Menarche: 66 years old Age at first live birth: 66 years old [place] P 1 LMP 88s Contraceptive HRT no  Hysterectomy? no BSO? no   SOCIAL HISTORY: (updated 02/2020)  [name] [name] is originally from [place].  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  She describes herself as single.  At home she lives with her daughter [name] who works for [employer], her daughters' husband [name] who is a English as a second language teacher, and their children who are 55 months old and 76 years old.  The patient is  [name]    ADVANCED DIRECTIVES: Not in place.  The patient tells me she would intend to name her daughter [name] as her healthcare power of attorney   HEALTH MAINTENANCE: Social History   Tobacco Use   Smoking status: Never   Smokeless tobacco: Never  Vaping Use   Vaping Use: Never used  Substance Use Topics   Alcohol use: Yes    Alcohol/week: 1.0 standard drink of alcohol    Types: 1 Glasses of wine per week    Comment: occasionally   Drug use: Never     Colonoscopy: never done  PAP: 2017 (performed in [place])  Bone density:    Allergies  Allergen Reactions   Sulfa Antibiotics Itching    Current Outpatient Medications  Medication Sig Dispense Refill   abemaciclib (VERZENIO) 100 MG tablet Take 1 tablet (100 mg total) by mouth 2 (two) times daily. 60 tablet 3   anastrozole (ARIMIDEX) 1 MG tablet Take 1 tablet (1 mg total) by mouth daily. 90 tablet 4   atorvastatin (LIPITOR) 40 MG tablet Take 1 tablet (40 mg total) by mouth daily. 90 tablet 1   calcium-vitamin D (OSCAL WITH D) 500-5 MG-MCG tablet Take 1 tablet by mouth daily. Will bring in dosing to next visit     carvedilol (COREG)  6.25 MG tablet Take 1 tablet (6.25 mg total) by mouth 2 (two) times daily with a meal. 180 tablet 1   diclofenac (VOLTAREN) 75 MG EC tablet Take 1 tablet (75 mg total) by mouth 2 (two) times daily. 60 tablet 5   lansoprazole (PREVACID) 30 MG capsule Take 30 mg by mouth every morning.     Magnesium 100 MG CAPS Take 100 mg by mouth 2 (two) times daily.     metFORMIN (GLUCOPHAGE) 1000 MG tablet Take 1 tablet (1,000 mg total) by mouth 2 (two) times daily with a meal. 180 tablet 1   Multiple Vitamins-Minerals (CENTRUM SILVER 50+WOMEN PO) Take 1 capsule by mouth daily.     UNABLE TO FIND Med Name: [place]. Pt reports taking 1 scoopful in tea or water     valsartan (DIOVAN) 320 MG tablet Take 1 tablet (320 mg total) by mouth daily. 90 tablet 1   vitamin C (ASCORBIC ACID) 500 MG tablet Take 500  mg by mouth daily.     No current facility-administered medications for this visit.    OBJECTIVE: African-American woman in no acute distress  Vitals:   02/26/22 1554  BP: (!) 140/81  Pulse: 63  Resp: 16  Temp: 97.9 F (36.6 C)  SpO2: 100%       Body mass index is 32.73 kg/m.   Wt Readings from Last 3 Encounters:  02/26/22 202 lb 12.8 oz (92 kg)  01/24/22 204 lb 3.2 oz (92.6 kg)  01/23/22 205 lb (93 kg)     ECOG FS:1 - Symptomatic but completely ambulatory  Physical Exam Constitutional:      Appearance: Normal appearance.  Cardiovascular:     Rate and Rhythm: Normal rate and regular rhythm.     Pulses: Normal pulses.     Heart sounds: Normal heart sounds.  Chest:     Comments: Right breast status postradiation changes. Lower portion of the nipple is more everted today, Hematoma appears better. No other palpable masses. No regional adenopathy. Left breast normal to inspection and palpation. Musculoskeletal:     Cervical back: Normal range of motion and neck supple. No rigidity.  Lymphadenopathy:     Cervical: No cervical adenopathy.  Skin:    General: Skin is warm and dry.  Neurological:     General: No focal deficit present.     Mental Status: She is alert.       LAB RESULTS:  CMP     Component Value Date/Time   NA 134 (L) 01/23/2022 1437   K 4.3 01/23/2022 1437   CL 101 01/23/2022 1437   CO2 27 01/23/2022 1437   GLUCOSE 127 (H) 01/23/2022 1437   BUN 10 01/23/2022 1437   CREATININE 0.91 01/23/2022 1437   CREATININE 0.97 01/10/2022 0808   CALCIUM 9.6 01/23/2022 1437   PROT 6.7 01/23/2022 1437   ALBUMIN 4.0 01/23/2022 1437   AST 12 (L) 01/23/2022 1437   ALT 8 01/23/2022 1437   ALKPHOS 76 01/23/2022 1437   BILITOT 0.2 (L) 01/23/2022 1437   GFRNONAA >60 01/23/2022 1437    No results found for: "TOTALPROTELP", "ALBUMINELP", "A1GS", "A2GS", "BETS", "BETA2SER", "GAMS", "MSPIKE", "SPEI"  Lab Results  Component Value Date   WBC 3.7 (L) 02/26/2022    NEUTROABS 1.9 02/26/2022   HGB 9.8 (L) 02/26/2022   HCT 28.8 (L) 02/26/2022   MCV 104.3 (H) 02/26/2022   PLT 226 02/26/2022    No results found for: "LABCA2"  No components found for: "FIEPPI951"  No  results for input(s): "INR" in the last 168 hours.  No results found for: "LABCA2"  No results found for: "TUU828"  No results found for: "CAN125"  No results found for: "CAN153"  No results found for: "CA2729"  No components found for: "HGQUANT"  No results found for: "CEA1", "CEA" / No results found for: "CEA1", "CEA"   No results found for: "AFPTUMOR"  No results found for: "CHROMOGRNA"  No results found for: "KPAFRELGTCHN", "LAMBDASER", "KAPLAMBRATIO" (kappa/lambda light chains)  No results found for: "HGBA", "HGBA2QUANT", "HGBFQUANT", "HGBSQUAN" (Hemoglobinopathy evaluation)   No results found for: "LDH"  No results found for: "IRON", "TIBC", "IRONPCTSAT" (Iron and TIBC)  No results found for: "FERRITIN"  Urinalysis No results found for: "COLORURINE", "APPEARANCEUR", "LABSPEC", "PHURINE", "GLUCOSEU", "HGBUR", "BILIRUBINUR", "KETONESUR", "PROTEINUR", "UROBILINOGEN", "NITRITE", "LEUKOCYTESUR"   STUDIES: No results found.   ELIGIBLE FOR AVAILABLE RESEARCH PROTOCOL: AET  ASSESSMENT: 66 y.o. [place] woman status post right breast upper outer quadrant (12:00) and axillary lymph node biopsy 02/03/2020, both positive for a clinical T1c N1, stage IIB invasive ductal carcinoma, grade 3, estrogen receptor positive, HER-2 and progesterone receptor negative, with an MIB-1 of 60%.  (a) biopsy of a second right upper outer quadrant T2 lesion (11 o'clock) 02/06/2020 also showed invasive ductal carcinoma, but grade 2, estrogen receptor positive, progesterone receptor and HER-2 negative, with an MIB-1 of 15%  (b) biopsy of a third right breast lesion (1:00) was benign   (1) neoadjuvant chemotherapy consisting of cyclophosphamide and docetaxel every 21 days x 4 starting  03/05/2020  (a) discontinued after 2 cycles because of peripheral neuropathy  (b) cyclophosphamide/doxorubicin started 04/24/2020, repeated every 21 days x4, completed 06/25/2020  (c) echo 04/17/2020 showed EF of 55-60%  (2) right lumpectomy 07/26/2020 found a residual pT1c pN1 invasive ductal carcinoma, grade 2, with negative margins  (a) repeat prognostic indicators show the tumor to be estrogen receptor positive, progesterone receptor and HER2 negative, with an MIB-1 of 2%.  (b) a total of 6 right axillary lymph nodes removed, 2 positive  (3) adjuvant radiation completed 10/17/2020  (4) anastrozole started June 2022  (5) CT scan of the chest 04/23/2020 shows a left brachiocephalicl catheter associated clot  (a) rivaroxaban started 04/24/2020, discontinued 08/22/2020  (b) port removed 07/26/2020   PLAN:  Started abemaciclib in March 2023.  She cannot remember why she did not started sooner, she was wondering maybe she wanted to think about it. Last MRI breast Jan 2023 showed no evidence of breast malignancy. Post surgical collection right breast near right nipple which could have been causing the drainage.  Mammogram recommended in 9 months. This has been scheduled for Oct 2023. Physical examination today, postop hematoma/seroma appears to have improved, no nipple drainage , bottom portion of the nipple everted. No masses or regional adenopathy. She is not tolerating the verzenio 150 mg po well, has lot of dyspepsia, nausea and diarrhea, will change the dose to 100 mg PO BID. RTC with John in 4 weeks, 8 weeks with me. CBC, CMP every 4 weeks. With regards to osteoporosis, she cannot afford a dentist and has some ongoing dental issues.  We have discussed that without dental clearance her risk of osteonecrosis can be considerably high.  I have advised calcium and vitamin D supplementation in the interim and find a dentist at her convenience.  Total encounter time 30 minutes. *Total  Encounter Time as defined by the Centers for Medicare and Medicaid Services includes, in addition to the face-to-face time of a patient visit (  documented in the note above) non-face-to-face time: obtaining and reviewing outside history, ordering and reviewing medications, tests or procedures, care coordination (communications with other health care professionals or caregivers) and documentation in the medical record.

## 2022-02-26 NOTE — Therapy (Signed)
OUTPATIENT PHYSICAL THERAPY TREATMENT NOTE/ RE-CERTIFICATION   Patient Name: [name] [name] MRN: [id number] DOB:10-04-1955, 66 y.o., female Today's Date: 02/26/2022  PCP: Sandrea Hughs, NP REFERRING PROVIDER: Aundra Dubin, PA-C  END OF SESSION:   PT End of Session - 02/26/22 1832     Visit Number 11    Number of Visits 16    Date for PT Re-Evaluation 04/09/22    Authorization Type None    Authorization Time Period FOTO v6, v10    PT Start Time 1832    PT Stop Time 1910    PT Time Calculation (min) 38 min    Activity Tolerance Patient tolerated treatment well    Behavior During Therapy WFL for tasks assessed/performed                  Past Medical History:  Diagnosis Date   Arthritis    Breast cancer ([place])    Cancer ([place])    breast   Diabetes mellitus without complication ([place])    History of MRI    Knee, Hip, and Back.   Hypertension    Personal history of chemotherapy    Personal history of radiation therapy    Past Surgical History:  Procedure Laterality Date   BREAST BIOPSY Right 02/03/2020   x2   BREAST BIOPSY Right 02/06/2020   x2   BREAST LUMPECTOMY Right 07/26/2020   BREAST LUMPECTOMY WITH RADIOACTIVE SEED AND SENTINEL LYMPH NODE BIOPSY Right 07/26/2020   Procedure: RIGHT BREAST LUMPECTOMY WITH RADIOACTIVE SEED X 2 AND SENTINEL LYMPH NODE BIOPSY;  Surgeon: Erroll Luna, MD;  Location: [facility];  Service: General;  Laterality: Right;   IR IMAGING GUIDED PORT INSERTION  02/23/2020   PORT-A-CATH REMOVAL N/A 07/26/2020   Procedure: REMOVAL PORT-A-CATH;  Surgeon: Erroll Luna, MD;  Location: [facility];  Service: General;  Laterality: N/A;   TOTAL KNEE ARTHROPLASTY Left 11/18/2021   Procedure: LEFT TOTAL KNEE ARTHROPLASTY;  Surgeon: Leandrew Koyanagi, MD;  Location: [facility];  Service: Orthopedics;  Laterality: Left;   Patient Active Problem List   Diagnosis Date Noted   Primary osteoarthritis of right knee  02/11/2022   Status post total left knee replacement 11/18/2021   Primary osteoarthritis of left knee 08/06/2021   Aortic atherosclerosis ([place]) 04/24/2020   Deep venous thrombosis of left upper extremity ([place]) 04/24/2020   Port-A-Cath in place 03/26/2020   Malignant neoplasm of upper-outer quadrant of right breast in female, estrogen receptor positive ([place]) 02/13/2020    REFERRING DIAG: B84.665 (ICD-10-CM) - S/P total knee arthroplasty, left  THERAPY DIAG:  Chronic pain of left knee  Localized edema  Difficulty in walking, not elsewhere classified  Muscle weakness (generalized)  Rationale for Evaluation and Treatment Rehabilitation  PERTINENT HISTORY: Lt TKA on 11/18/2021 with Dr. Frankey Shown, DMII, HTN, Hx of breast cx with chemo/ radiation/ surgery (in remission)  PRECAUTIONS: Knee and Fall  ONSET DATE: 11/18/2021  SUBJECTIVE: Pt reports 3/10 Lt knee pain and also reports increased Rt knee pain today. She reports varied adherence to her HEP.  PAIN:  Are you having pain? Yes: NPRS scale: 3/10 Lt knee Pain location: Lt knee Pain description: Achy, sharp Aggravating factors: walking >5 minutes with FWW, standing after prolonged sitting, and bending knee Relieving factors: ice, elevation, and pain medication   OBJECTIVE: (objective measures completed at initial evaluation unless otherwise dated)   DIAGNOSTIC FINDINGS: N/A   PATIENT SURVEYS:  FOTO 38%, predicted 61% in 13 visits  02/05/2022:  40%  02/19/2022: 57%   COGNITION:           Overall cognitive status: Within functional limits for tasks assessed                          SENSATION: Light touch: Lt L2-S2 dermatomes impaired vs Rt   EDEMA:  Circumferential: 46.5cm on Rt, 52cm on Lt     POSTURE: flexed trunk  and weight shift right   PALPATION: TTP along Lt surgical incision   LOWER EXTREMITY ROM:   A/PROM Right eval Left eval 12/24/21 Left 12/26/21 Left 12/31/21 Left 01/02/2022 Left 02/05/2022 Left  02/19/2022 Left  Knee flexion 110 80p!,90p! 100/109 p! 105/110 p! 106/111p! 107, 115p! PROM following MET 109, 116p! 114, 121p!  Knee extension 2 -9p!, -5p! -3   -4 0/ 1p! 0, 2p!   (Blank rows = not tested)   LOWER EXTREMITY MMT:   MMT Right eval Left eval Right 02/05/2022 Left 02/05/2022  Hip flexion 4/5 3/5 4+/5 4+/5  Hip extension 3/5 3/5 4/5 4/5  Hip abduction 3+/5 3/5 4/5, p! In Rt knee 4/5  Knee flexion 5/5 3/p! 5/5 5/5  Knee extension 3+/5p! 2+/5p! 4+/5p! 5/5p!   (Blank rows = not tested)     FUNCTIONAL TESTS:  5xSTS: 29 seconds SLR: roughly 10 degrees of quad lag throughout motion  02/05/2022: 5xSTS: 20 seconds with UE support SLR: WNL x10   GAIT: Distance walked: 19f Assistive device utilized: WEnvironmental consultant- 2 wheeled Level of assistance: Modified independence Comments: Decreased gait speed, Lt antalgic gait with forward posture       TODAY'S TREATMENT:  [facility] Adult PT Treatment:                                                DATE: 02/26/2022 Therapeutic Exercise: Supine Lt heel slide with green strap 2x10 with 5-sec hold at end range Supine Lt hamstring stretch with strap x266m Supine Lt SLR with hip abduction with 2.5# ankle weight 2x10  Deadlift with 15# kettlebell 3x10 Standing hamstring curl with 7# cable 2x10 BIL Standing hip flexion isometric with knee extension with 3# cable to ankle 2x10 BIL Manual Therapy: N/A Neuromuscular re-ed: N/A Therapeutic Activity: N/A Modalities: N/A Self Care: N/A   [facility] Adult PT Treatment:                                                DATE: 02/19/2022 Therapeutic Exercise: Supine Lt knee flexion AAROM with green strap 2x10 with 5-sec hold at end-range Seated knee extension from 90d flexion to 30d flexion with 15# 3x10 Seated hamstring curls with 0d flexion to 60d flexion with 35# 3x10 Standing Cybex hip abduction with 30# 2x10 BIL Seated BIL active hamstring stretch [id number] Therapy: N/A Neuromuscular  re-ed: N/A Therapeutic Activity: Re-administration of FOTO with pt education Re-assessment of objective measures with pt education 4-inch forward step-down with slow lowering and backward step-up, 3x10 Lt stance leg Mini dead lift with 25# kettlebell 3x8 with cues for functional lifting technique Modalities: N/A Self Care: N/A   [facility]  [facility] Therapeutic Exercise: Stationary bike x5 min while collecting subjective information Mini [place] split squat with UE support x10 BIL Seated BIL knee extension machine with 20# from 90d flexion to 30d flexion 3x12 Forward lunge to each side of Bosu ball 2x10 each BIL Seated hamstring curls with 25# 3x12 Squats with two 7# cables into waist attachment with UE support 3x8 Mini squat side steps with 7# cable to waist attachment 2x5 walkouts BIL Prone quad stretch with sheet x22mn BIL Prone alternating hip extension 2x10 BIL Manual Therapy: N/A Neuromuscular re-ed: N/A Therapeutic Activity: N/A Modalities: N/A Self Care: N/A     PATIENT EDUCATION:  Education details: Pt educated on prognosis, POC, FOTO, and HEP Person educated: Patient Education method: EConsulting civil engineer DMedia planner and Handouts Education comprehension: verbalized understanding and returned demonstration     HOME EXERCISE PROGRAM: Access Code: [email or web address]: [email or web address] Date: 12/06/2021 Prepared by: TVanessa Clarion  Exercises - Straight Leg Raise  - 2 x daily - 7 x weekly - 3 sets - 10 reps - 3-sec hold - Heel slide with strap, LAYING ON BACK  - 2 x daily - 7 x weekly - 3 sets - 10 reps - 5-sec hold - Seated Hamstring Stretch  - 2 x daily - 7 x weekly - 2 sets - 1-min hold - Step Up  - 2 x daily - 7 x weekly - 3 sets - 20 reps - 4 Way Patellar Glide  - 2 x daily - 7 x weekly - 2 sets - 10 reps   ASSESSMENT:   CLINICAL IMPRESSION: Pt responded well to all  interventions today, demonstrating visually improved knee ROM following stretching and reporting a therapeutic response to treatment today. She will continue to benefit from skilled PT to address her primary impairments and return to her prior level of function with less limitation.   OBJECTIVE IMPAIRMENTS Abnormal gait, decreased balance, decreased coordination, decreased endurance, decreased knowledge of use of DME, decreased mobility, difficulty walking, decreased ROM, decreased strength, hypomobility, increased edema, impaired flexibility, improper body mechanics, postural dysfunction, and pain.    ACTIVITY LIMITATIONS carrying, lifting, bending, sitting, standing, squatting, sleeping, stairs, transfers, bed mobility, toileting, dressing, and locomotion level   PARTICIPATION LIMITATIONS: cleaning, laundry, interpersonal relationship, driving, shopping, community activity, occupation, and yard work   PERSONAL FACTORS 3+ comorbidities: See medical hx  are also affecting patient's functional outcome.        GOALS: Goals reviewed with patient? Yes   SHORT TERM GOALS: Target date: 01/03/2022  Pt will report understanding and adherence to initial HEP in order to promote independence in the management of primary impairments. Baseline: HEP provided at eval Goal status: MET Pt reports adherence 12/31/21   2.  Pt will achieve Lt knee AROM of 0-100 degrees in order to progress to future stage of post-op protocol. Baseline: -9-80 02/05/2022: 0-109 Goal status: ACHIEVED   3.  Pt will achieve 10 SLR with no quad lag in order to progress to future stage of post-op protocol. Baseline: 10 degrees of quad lag with 1 rep 02/05/2022: WNL x10 Goal status: ACHIEVED     LONG TERM GOALS: Target date: 01/31/2022    Pt will achieve FOTO score of 61% in order to demonstrate improved functional ability as it relates to her primary knee impairments. Baseline: 38% 02/05/2022: 40% 02/19/2022: 57% Goal status:  IN PROGRESS   2.  Pt will achieve Lt knee AROM of 0-120 degrees in order to establish a normal gait pattern for community ambulation.  Baseline: -9-80 02/05/2022: 0-109 02/19/2022: 0-114 Goal status: IN PROGRESS   3.  Pt will achieve global BIL LE strength of 4+/5 or greater in order to progress her independent LE strengthening regimen with less limitation. Baseline: See MMT chart 02/05/2022: See updated MMT chart Goal status: IN PROGRESS   4.  Pt will report ability to walk > 20 minutes without AD in order to grocery shop with less limitation. Baseline: 5 minutes with FWW 02/05/2022: 5 minutes without AD Goal status: IN PROGRESS   5.  Pt will achieve a 5xSTS in 14 seconds or less without UE support in order to demonstrate safe transfers. Baseline: 29 seconds with UE support  02/05/2022: 20 seconds with UE support Goal status: IN PROGRESS       PLAN: PT FREQUENCY: 1x/week   PT DURATION: 8 weeks   PLANNED INTERVENTIONS: Therapeutic exercises, Therapeutic activity, Neuromuscular re-education, Balance training, Gait training, Patient/Family education, Self Care, Joint mobilization, Stair training, Orthotic/Fit training, DME instructions, Aquatic Therapy, Dry Needling, Electrical stimulation, Cryotherapy, Taping, Vasopneumatic device, Biofeedback, Ionotophoresis 65m/ml Dexamethasone, Manual therapy, and Re-evaluation   PLAN FOR NEXT SESSION: Progress early knee strengthening/ ROM in accordance with post-op protocol   [facility]Van[facility] PT, DPT 02/26/22 7:10 PM

## 2022-02-26 NOTE — Progress Notes (Signed)
[facility]  [phone] [phone]     ID: [name] [name] DOB: 11/20/1955  MR#: [id number]  HYI#:[id number]  Patient Care Team: Sandrea Hughs, NP as PCP - General (Family Medicine) Mauro Kaufmann, RN as Oncology Nurse Navigator Rockwell Germany, RN as Oncology Nurse Navigator Erroll Luna, MD as Consulting Physician (General Surgery) Eppie Gibson, MD as Attending Physician (Radiation Oncology) Benay Pike, MD as Medical Oncologist (Hematology and Oncology) Raina Mina, RPH-CPP as Pharmacist (Hematology and Oncology) Benay Pike, MD  OTHER MD:  CHIEF COMPLAINT: Estrogen receptor positive breast cancer  CURRENT TREATMENT: Anastrozole with abemaciclib.  INTERVAL HISTORY:  [name] [name] returns today for follow up of her estrogen receptor positive breast cancer.  She is on adjuvant anastrazole with abemaciclib. She is tolerating 100 mg PO BID well. No more heartburn. No nausea, vomiting and diarrhea. Rest of the pertinent 10 point ROS reviewed and negative.    COVID 19 VACCINATION STATUS: s/p  [employer]  X2, most recently June 2021, no booster as of 05/01/2020   HISTORY OF CURRENT ILLNESS: From the original intake note:  [name] [name] herself palpated a mass in the right axilla "a long time ago", more recently with complaints of associated pain. She brought it to medical attention and underwent bilateral diagnostic mammography with tomography and bilateral breast ultrasonography at [facility] on 01/31/2020 showing: breast density category B; three right breast masses-- 1.9 cm at 12 o'clock, 2.3 cm at 11 o'clock, 0.4 cm at 1 o'clock; two adjacent enlarged thickened lymph nodes measuring up to 2.1 cm; no evidence of left breast malignancy.  Accordingly on 02/03/2020 she proceeded to biopsy of the right breast area at 12 o'clock and right axilla. The pathology from this procedure ([id number]) showed: invasive ductal carcinoma, grade  3. Prognostic indicators significant for: estrogen receptor, 100% positive with strong staining intensity and progesterone receptor, 0% negative. Proliferation marker Ki67 at 60%. HER2 equivocal by immunohistochemistry (2+), but negative by fluorescent in situ hybridization with a signals ratio 0.81 and number per cell 2.7.  The biopsied lymph node was found to show metastatic carcinoma. The morphology was considered different from the biopsied mass, and a prognostic panel was performed. Estrogen receptor, 100% positive with strong staining intensity and progesterone receptor, 0% negative. Proliferation marker Ki67 at 70%. HER2 equivcoal by immunohistochemistry (2+), but negative) by fluorescent in situ hybridization with a signals ratio 1.16 and number per cell 3.9.  She underwent additional right breast biopsies on 02/06/2020. Pathology [id number]) from the mass at 11 o'clock showed: invasive ductal carcinoma, grade 2. Prognostic indicators significant for: estrogen receptor, 100% positive with strong staining intensity and progesterone receptor, 0% negative. Proliferation marker Ki67 at 15%. HER2 equivocal by immunohistochemistry (2+), but negative by fluorescent in situ hybridization with a signals ratio 1.29 and number per cell 2.7.  The biopsied mass at 1 o'clock showed only fibrocystic change.  The patient's subsequent history is as detailed below.   PAST MEDICAL HISTORY: Past Medical History:  Diagnosis Date   Arthritis    Breast cancer ([place])    Cancer ([place])    breast   Diabetes mellitus without complication ([place])    History of MRI    Knee, Hip, and Back.   Hypertension    Personal history of chemotherapy    Personal history of radiation therapy     PAST SURGICAL HISTORY: Past Surgical History:  Procedure Laterality Date   BREAST BIOPSY Right 02/03/2020   x2   BREAST BIOPSY Right  02/06/2020   x2   BREAST LUMPECTOMY Right 07/26/2020   BREAST LUMPECTOMY WITH RADIOACTIVE SEED  AND SENTINEL LYMPH NODE BIOPSY Right 07/26/2020   Procedure: RIGHT BREAST LUMPECTOMY WITH RADIOACTIVE SEED X 2 AND SENTINEL LYMPH NODE BIOPSY;  Surgeon: Erroll Luna, MD;  Location: [facility];  Service: General;  Laterality: Right;   IR IMAGING GUIDED PORT INSERTION  02/23/2020   PORT-A-CATH REMOVAL N/A 07/26/2020   Procedure: REMOVAL PORT-A-CATH;  Surgeon: Erroll Luna, MD;  Location: [facility];  Service: General;  Laterality: N/A;   TOTAL KNEE ARTHROPLASTY Left 11/18/2021   Procedure: LEFT TOTAL KNEE ARTHROPLASTY;  Surgeon: Leandrew Koyanagi, MD;  Location: [facility];  Service: Orthopedics;  Laterality: Left;    FAMILY HISTORY: Family History  Problem Relation Age of Onset   Asthma Mother    Arthritis Sister    Asthma Brother    Arthritis Brother   [name] died in his 31s from causes unknown to the patient.  [name] died from heart disease at age 64.  The patient had 4 brothers, 1 sister, with no history of cancer in the family to her knowledge   GYNECOLOGIC HISTORY:  No LMP recorded. Patient is postmenopausal. Menarche: 66 years old Age at first live birth: 66 years old [place] P 1 LMP 64s Contraceptive HRT no  Hysterectomy? no BSO? no   SOCIAL HISTORY: (updated 02/2020)  Kent is originally from [place].  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  She describes herself as single.  At home she lives with her daughter [name] [name] who works for [employer], her daughters' husband [name] who is a English as a second language teacher, and their children who are 64 months old and 87 years old.  The patient is [name]    ADVANCED DIRECTIVES: Not in place.  The patient tells me she would intend to name her daughter [name] as her healthcare power of attorney   HEALTH MAINTENANCE: Social History   Tobacco Use   Smoking status: Never   Smokeless tobacco: Never  Vaping Use   Vaping Use: Never used  Substance Use Topics   Alcohol use:  Yes    Alcohol/week: 1.0 standard drink of alcohol    Types: 1 Glasses of wine per week    Comment: occasionally   Drug use: Never     Colonoscopy: never done  PAP: 2017 (performed in [place])  Bone density:    Allergies  Allergen Reactions   Sulfa Antibiotics Itching    Current Outpatient Medications  Medication Sig Dispense Refill   abemaciclib (VERZENIO) 100 MG tablet Take 1 tablet (100 mg total) by mouth 2 (two) times daily. 60 tablet 3   anastrozole (ARIMIDEX) 1 MG tablet Take 1 tablet (1 mg total) by mouth daily. 90 tablet 4   atorvastatin (LIPITOR) 40 MG tablet Take 1 tablet (40 mg total) by mouth daily. 90 tablet 1   calcium-vitamin D (OSCAL WITH D) 500-5 MG-MCG tablet Take 1 tablet by mouth daily. Will bring in dosing to next visit     carvedilol (COREG) 6.25 MG tablet Take 1 tablet (6.25 mg total) by mouth 2 (two) times daily with a meal. 180 tablet 1   diclofenac (VOLTAREN) 75 MG EC tablet Take 1 tablet (75 mg total) by mouth 2 (two) times daily. 60 tablet 5   lansoprazole (PREVACID) 30 MG capsule Take 30 mg by mouth every morning.     Magnesium 100 MG CAPS Take  100 mg by mouth 2 (two) times daily.     metFORMIN (GLUCOPHAGE) 1000 MG tablet Take 1 tablet (1,000 mg total) by mouth 2 (two) times daily with a meal. 180 tablet 1   Multiple Vitamins-Minerals (CENTRUM SILVER 50+WOMEN PO) Take 1 capsule by mouth daily.     UNABLE TO FIND Med Name: [place]. Pt reports taking 1 scoopful in tea or water     valsartan (DIOVAN) 320 MG tablet Take 1 tablet (320 mg total) by mouth daily. 90 tablet 1   vitamin C (ASCORBIC ACID) 500 MG tablet Take 500 mg by mouth daily.     No current facility-administered medications for this visit.    OBJECTIVE: African-American woman in no acute distress  Vitals:   02/26/22 1554  BP: (!) 140/81  Pulse: 63  Resp: 16  Temp: 97.9 F (36.6 C)  SpO2: 100%       Body mass index is 32.73 kg/m.   Wt Readings from Last 3 Encounters:   02/26/22 202 lb 12.8 oz (92 kg)  01/24/22 204 lb 3.2 oz (92.6 kg)  01/23/22 205 lb (93 kg)     ECOG FS:1 - Symptomatic but completely ambulatory  Physical Exam Constitutional:      Appearance: Normal appearance.  Cardiovascular:     Rate and Rhythm: Normal rate and regular rhythm.     Pulses: Normal pulses.     Heart sounds: Normal heart sounds.  Chest:     Comments: Right breast status postradiation changes.  No new palpable masses.  Left breast normal to inspection and palpation.  No regional adenopathy. Musculoskeletal:     Cervical back: Normal range of motion and neck supple. No rigidity.  Lymphadenopathy:     Cervical: No cervical adenopathy.  Skin:    General: Skin is warm and dry.  Neurological:     General: No focal deficit present.     Mental Status: She is alert.    LAB RESULTS:  CMP     Component Value Date/Time   NA 134 (L) 01/23/2022 1437   K 4.3 01/23/2022 1437   CL 101 01/23/2022 1437   CO2 27 01/23/2022 1437   GLUCOSE 127 (H) 01/23/2022 1437   BUN 10 01/23/2022 1437   CREATININE 0.91 01/23/2022 1437   CREATININE 0.97 01/10/2022 0808   CALCIUM 9.6 01/23/2022 1437   PROT 6.7 01/23/2022 1437   ALBUMIN 4.0 01/23/2022 1437   AST 12 (L) 01/23/2022 1437   ALT 8 01/23/2022 1437   ALKPHOS 76 01/23/2022 1437   BILITOT 0.2 (L) 01/23/2022 1437   GFRNONAA >60 01/23/2022 1437    No results found for: "TOTALPROTELP", "ALBUMINELP", "A1GS", "A2GS", "BETS", "BETA2SER", "GAMS", "MSPIKE", "SPEI"  Lab Results  Component Value Date   WBC 3.7 (L) 02/26/2022   NEUTROABS 1.9 02/26/2022   HGB 9.8 (L) 02/26/2022   HCT 28.8 (L) 02/26/2022   MCV 104.3 (H) 02/26/2022   PLT 226 02/26/2022    No results found for: "LABCA2"  No components found for: "RXVQMG867"  No results for input(s): "INR" in the last 168 hours.  No results found for: "LABCA2"  No results found for: "YPP509"  No results found for: "CAN125"  No results found for: "CAN153"  No results  found for: "CA2729"  No components found for: "HGQUANT"  No results found for: "CEA1", "CEA" / No results found for: "CEA1", "CEA"   No results found for: "AFPTUMOR"  No results found for: "CHROMOGRNA"  No results found for: "KPAFRELGTCHN", "LAMBDASER", "  KAPLAMBRATIO" (kappa/lambda light chains)  No results found for: "HGBA", "HGBA2QUANT", "HGBFQUANT", "HGBSQUAN" (Hemoglobinopathy evaluation)   No results found for: "LDH"  No results found for: "IRON", "TIBC", "IRONPCTSAT" (Iron and TIBC)  No results found for: "FERRITIN"  Urinalysis No results found for: "COLORURINE", "APPEARANCEUR", "LABSPEC", "PHURINE", "GLUCOSEU", "HGBUR", "BILIRUBINUR", "KETONESUR", "PROTEINUR", "UROBILINOGEN", "NITRITE", "LEUKOCYTESUR"   STUDIES: No results found.   ELIGIBLE FOR AVAILABLE RESEARCH PROTOCOL: AET  ASSESSMENT: 66 y.o. [place] woman status post right breast upper outer quadrant (12:00) and axillary lymph node biopsy 02/03/2020, both positive for a clinical T1c N1, stage IIB invasive ductal carcinoma, grade 3, estrogen receptor positive, HER-2 and progesterone receptor negative, with an MIB-1 of 60%.  (a) biopsy of a second right upper outer quadrant T2 lesion (11 o'clock) 02/06/2020 also showed invasive ductal carcinoma, but grade 2, estrogen receptor positive, progesterone receptor and HER-2 negative, with an MIB-1 of 15%  (b) biopsy of a third right breast lesion (1:00) was benign   (1) neoadjuvant chemotherapy consisting of cyclophosphamide and docetaxel every 21 days x 4 starting 03/05/2020  (a) discontinued after 2 cycles because of peripheral neuropathy  (b) cyclophosphamide/doxorubicin started 04/24/2020, repeated every 21 days x4, completed 06/25/2020  (c) echo 04/17/2020 showed EF of 55-60%  (2) right lumpectomy 07/26/2020 found a residual pT1c pN1 invasive ductal carcinoma, grade 2, with negative margins  (a) repeat prognostic indicators show the tumor to be estrogen receptor  positive, progesterone receptor and HER2 negative, with an MIB-1 of 2%.  (b) a total of 6 right axillary lymph nodes removed, 2 positive  (3) adjuvant radiation completed 10/17/2020  (4) anastrozole started June 2022  (5) CT scan of the chest 04/23/2020 shows a left brachiocephalicl catheter associated clot  (a) rivaroxaban started 04/24/2020, discontinued 08/22/2020  (b) port removed 07/26/2020   PLAN:  Started abemaciclib in March 2023.  She cannot remember why she did not started sooner, she was wondering maybe she wanted to think about it. Last MRI breast Jan 2023 showed no evidence of breast malignancy. Post surgical collection right breast near right nipple which could have been causing the drainage.  Mammogram recommended in 9 months. This has been scheduled for Mar 04 2022. Physical examination today, no concerning masses or regional adenopathy.  Postradiation changes appreciated in the right breast. She is is tolerating her 100 mg of Verzenio twice daily very well.  We will refill this. She can return to clinic to see me in about 8 weeks with repeat labs.  She is working with her dentist, has some impending dental extractions.  In the interim she was instructed to take calcium and vitamin D.  We can consider bisphosphonates when she is done with her dental treatment.  Total encounter time 30 minutes. *Total Encounter Time as defined by the Centers for Medicare and Medicaid Services includes, in addition to the face-to-face time of a patient visit (documented in the note above) non-face-to-face time: obtaining and reviewing outside history, ordering and reviewing medications, tests or procedures, care coordination (communications with other health care professionals or caregivers) and documentation in the medical record.

## 2022-03-03 NOTE — Telephone Encounter (Signed)
Oral Oncology Patient Advocate Encounter  Signatures have been collected from the patient.   Application is pending completion of MD signatures before final submission.  [name], CPhT-Adv Oncology Pharmacy Patient [place] Direct Number: [phone]  Fax: [phone]

## 2022-03-04 ENCOUNTER — Encounter: Payer: Self-pay | Admitting: Oncology

## 2022-03-04 ENCOUNTER — Other Ambulatory Visit: Payer: Self-pay | Admitting: *Deleted

## 2022-03-04 ENCOUNTER — Ambulatory Visit
Admission: RE | Admit: 2022-03-04 | Discharge: 2022-03-04 | Disposition: A | Discharge status: Home / Self Care | Payer: No Typology Code available for payment source | Source: Ambulatory Visit | Attending: Family | Admitting: Family

## 2022-03-04 ENCOUNTER — Ambulatory Visit: Payer: Self-pay | Admitting: *Deleted

## 2022-03-04 VITALS — BP 102/68 | Wt 205.0 lb

## 2022-03-04 DIAGNOSIS — Z1231 Encounter for screening mammogram for malignant neoplasm of breast: Secondary | ICD-10-CM

## 2022-03-04 DIAGNOSIS — Z853 Personal history of malignant neoplasm of breast: Secondary | ICD-10-CM

## 2022-03-04 DIAGNOSIS — Z1239 Encounter for other screening for malignant neoplasm of breast: Secondary | ICD-10-CM

## 2022-03-04 DIAGNOSIS — Z1211 Encounter for screening for malignant neoplasm of colon: Secondary | ICD-10-CM

## 2022-03-04 NOTE — Patient Instructions (Signed)
Explained breast self awareness with [name]. Patient did not need a Pap smear today due to last Pap smear and HPV typing was 01/31/2021. Let her know [facility] will cover Pap smears and HPV typing every 5 years unless has a history of abnormal Pap smears. Referred patient to the [facility] for a diagnostic mammogram per recommendation due to patients history of breast cancer. Appointment scheduled Thursday, March 27, 2022 at [facility]. Patient aware of appointment and will be there or will call and reschedule. [name] verbalized understanding.  Anely Spiewak, Arvil [place], RN 3:00 PM

## 2022-03-04 NOTE — Progress Notes (Signed)
[name] is a 66 y.o. female who presents to [facility] clinic today with no complaints.    Pap Smear: Pap smear not completed today. Last Pap smear was 02/07/2021 at [facility] clinic and was normal with negative HPV. Per patient has no history of an abnormal Pap smear. Last Pap smear result is available in Epic.   Physical exam: Breasts Left breast larger than right breast due to history of right breast lumpectomy for breast cancer 07/26/2020. No skin abnormalities left breast. Radiation changes observed right breast due to history of radiation treatment to breast for breast cancer. No nipple retraction left breast. Slight right nipple retraction observed on exam that has been a change since previous exam 02/07/2021. No nipple discharge bilateral breasts on exam. No lymphadenopathy. No lumps palpated left breast. Palpated a firm area versus mass within the right center of breast that measures 10 cm x 12 cm that was noted on previous exam 05/21/2021. No complaints of pain or tenderness on exam.  MS DIGITAL DIAG TOMO UNI RIGHT  Result Date: 05/24/2021 CLINICAL DATA:  The patient had right breast surgery in March of 2022 for breast cancer. Her first follow-up mammogram was September 2022. The patient has had bloody and clear right nipple discharge since that time. EXAM: DIGITAL DIAGNOSTIC UNILATERAL RIGHT MAMMOGRAM WITH TOMOSYNTHESIS AND CAD; ULTRASOUND RIGHT BREAST LIMITED TECHNIQUE: Right digital diagnostic mammography and breast tomosynthesis was performed. The images were evaluated with computer-aided detection.; Targeted ultrasound examination of the right breast was performed COMPARISON:  Previous exam(s). ACR Breast Density Category b: There are scattered areas of fibroglandular density. FINDINGS: The patient's postoperative seroma is smaller compared to February 07, 2021. Postsurgical changes and post radiation changes are noted. No other suspicious findings in the right breast. On physical exam, nipple  discharge is elicited when pressure was placed on the seroma. Targeted ultrasound is performed, showing the patient is postoperative seroma with no other abnormalities. IMPRESSION: No mammographic or sonographic evidence of malignancy. I suspect the patient's nipple discharge is due to a connection between the seroma and a milk duct. Underlying malignancy is considered less likely. RECOMMENDATION: Recommend breast MRI to exclude other causes of nipple discharge. The patient will then be sent to her surgeon after MRI results. I have discussed the findings and recommendations with the patient. If applicable, a reminder letter will be sent to the patient regarding the next appointment. BI-RADS CATEGORY  2: Benign. Electronically Signed   By: Dorise Bullion III M.D.   On: 05/24/2021 09:19  MS DIGITAL DIAG TOMO BILAT  Result Date: 02/07/2021 CLINICAL DATA:  66 year old female with history of RIGHT breast cancer and lumpectomy on 07/26/2020. Now with palpable RIGHT breast thickening, without fever or redness. EXAM: DIGITAL DIAGNOSTIC BILATERAL MAMMOGRAM WITH TOMOSYNTHESIS AND CAD; ULTRASOUND RIGHT BREAST LIMITED TECHNIQUE: Bilateral digital diagnostic mammography and breast tomosynthesis was performed. The images were evaluated with computer-aided detection.; Targeted ultrasound examination of the right breast was performed COMPARISON:  Previous exam(s). ACR Breast Density Category b: There are scattered areas of fibroglandular density. FINDINGS: 2D and 3D full field views of both breasts and magnification/spot compression views of the lumpectomy site are performed. Lumpectomy changes RETROAREOLAR/central RIGHT breast are noted with associated 7 cm oval structure. LEFT breast treatment changes are noted. No suspicious mammographic abnormalities are noted within either breast. On physical exam, fullness in the UPPER RIGHT breast identified. Targeted ultrasound is performed, showing a 6.3 x 5.3 x 7.3 cm complicated  collection within the UPPER RIGHT breast centered  at the 12 o'clock position 3 cm from the nipple. IMPRESSION: 1. 7.3 cm postoperative collection in the UPPER RIGHT breast accounting for the patient's area of palpable fullness/thickening. 2. RIGHT breast surgical and treatment changes. 3. No evidence of breast malignancy. RECOMMENDATION: Consider clinical follow-up as indicated. Ultrasound-guided aspiration of this RIGHT breast postoperative collection can be performed if indicated. Bilateral diagnostic mammogram in 1 year. I have discussed the findings and recommendations with the patient. If applicable, a reminder letter will be sent to the patient regarding the next appointment. BI-RADS CATEGORY  2: Benign. Electronically Signed   By: Margarette Canada M.D.   On: 02/07/2021 11:43  MM Breast Surgical Specimen  Result Date: 07/26/2020 CLINICAL DATA:  Patient status post right lumpectomy and node excision. EXAM: SPECIMEN RADIOGRAPH OF THE RIGHT BREAST COMPARISON:  Previous exam(s). FINDINGS: Status post excision of the right breast. The radioactive seed and biopsy marker clips are present and completely intact. IMPRESSION: Specimen radiograph of the right breast. Electronically Signed   By: Lovey Newcomer M.D.   On: 07/26/2020 16:10   MM Breast Surgical Specimen  Result Date: 07/26/2020 CLINICAL DATA:  Status post right breast lumpectomy. EXAM: SPECIMEN RADIOGRAPH OF THE RIGHT BREAST COMPARISON:  Previous exam(s). FINDINGS: Status post excision of the right breast. The radioactive seed and biopsy marker clip are present and completely intact. IMPRESSION: Specimen radiograph of the right breast. Electronically Signed   By: Lovey Newcomer M.D.   On: 07/26/2020 16:09   MM Breast Surgical Specimen  Result Date: 07/26/2020 CLINICAL DATA:  Patient status post right breast lumpectomy. EXAM: SPECIMEN RADIOGRAPH OF THE RIGHT BREAST COMPARISON:  Previous exam(s). FINDINGS: Status post excision of the right breast. The  radioactive seed and biopsy marker clip are present and completely intact. IMPRESSION: Specimen radiograph of the right breast. Electronically Signed   By: Lovey Newcomer M.D.   On: 07/26/2020 16:09   MM CLIP PLACEMENT RIGHT  Result Date: 07/25/2020 CLINICAL DATA:  Status post ultrasound-guided radioactive seed localization invasive ductal carcinomas in the 12 o'clock and 11 o'clock position of the right breast and a metastatic right axillary lymph node. EXAM: DIAGNOSTIC RIGHT MAMMOGRAM POST ULTRASOUND-GUIDED RADIOACTIVE SEED PLACEMENT X 3 COMPARISON:  Previous exam(s). FINDINGS: Mammographic images were obtained following ultrasound-guided radioactive seed placement x 3. These demonstrate a radioactive seed adjacent to the ribbon shaped biopsy marker clip in the 12 o'clock position of the right breast, a radioactive seed adjacent to the coil shaped biopsy marker clip in the 11 o'clock position of the right breast and a radioactive seed in the previously biopsied right axillary lymph node. The HydroMARK clip in the right axilla again could not be included but was well seen at ultrasound in the biopsied and localized lymph node. IMPRESSION: Appropriate positions of 2 radioactive seeds in the right breast and 1 radioactive seed in the right axilla. Final Assessment: Post Procedure Mammograms for Seed Placement Electronically Signed   By: Claudie Revering M.D.   On: 07/25/2020 11:43   MM CLIP PLACEMENT RIGHT  Result Date: 02/06/2020 CLINICAL DATA:  Post biopsy mammogram of the right breast for clip placement. EXAM: DIAGNOSTIC RIGHT MAMMOGRAM POST ULTRASOUND BIOPSY COMPARISON:  Previous exam(s). FINDINGS: Mammographic images were obtained following ultrasound guided biopsy of 2 masses in the right breast. The biopsy marking clips in expected position at the sites of biopsy. IMPRESSION: 1. Appropriate positioning of the coil shaped biopsy marking clip at the site of biopsy in the upper-outer right breast (11 o'clock).  2.  Appropriate positioning of the heart shaped biopsy marking clip in the upper inner right breast (1 o'clock). Final Assessment: Post Procedure Mammograms for Marker Placement Electronically Signed   By: Ammie Ferrier M.D.   On: 02/06/2020 10:34   MM CLIP PLACEMENT RIGHT  Result Date: 02/03/2020 CLINICAL DATA:  Status post ultrasound-guided core biopsies of a mass in the 12 o'clock region of the right breast and a right axillary lymph node. EXAM: 3D DIAGNOSTIC right MAMMOGRAM POST ULTRASOUND BIOPSIES COMPARISON:  Previous exam(s). FINDINGS: 3D Mammographic images were obtained following ultrasound guided core biopsies of a mass in the 12 o'clock region of the right breast and a right axillary lymph node. Mammographic images show there is a ribbon shaped clip in the mass in the 12 o'clock region of the right breast. The HydroMARK clip in the right axilla is not visualized secondary to the posterior location of the lymph node. IMPRESSION: Appropriate positioning of the ribbon shaped clip in the 12 o'clock region of the right breast. The HydroMARK clip was not visualized secondary to the posterior location of the lymph node. Final Assessment: Post Procedure Mammograms for Marker Placement Electronically Signed   By: Lillia Mountain M.D.   On: 02/03/2020 15:15   MS DIGITAL DIAG TOMO BILAT  Result Date: 01/31/2020 CLINICAL DATA:  Patient presents for palpable mass within the right axilla. EXAM: DIGITAL DIAGNOSTIC BILATERAL MAMMOGRAM WITH CAD AND TOMO ULTRASOUND BILATERAL BREAST COMPARISON:  None. ACR Breast Density Category b: There are scattered areas of fibroglandular density. FINDINGS: Underlying the palpable marker within the right axilla are 2 adjacent enlarged lymph nodes. Within the anterior central right breast there are 2 adjacent irregular masses measuring 1.6 and 2.0 cm. Within the upper inner right breast posterior depth there is a persistent small lobular mass. Questioned asymmetry within the outer  right breast appeared to resolve with additional imaging. Questioned asymmetry within the anterior left breast appeared to resolve with additional imaging. Mammographic images were processed with CAD. Targeted ultrasound is performed, showing a 1.9 x 1.7 x 1.7 cm irregular hypoechoic right breast mass 12 o'clock position 1 cm from nipple. There is an adjacent 2.0 x 2.3 x 1.0 cm irregular hypoechoic mass right breast 11 o'clock position 3 cm from nipple. There is a 0.4 x 0.3 x 0.3 cm irregular hypoechoic mass right breast 1 o'clock position 8 cm from nipple. There are 2 adjacent enlarged thickened lymph nodes measuring up to 2.1 cm. No suspicious abnormality within the retroareolar left breast. Dense tissue is visualized. IMPRESSION: 1. There are 3 suspicious masses within the right breast concerning primary breast malignancy. 2. Two adjacent enlarged thickened right axillary lymph nodes concerning for metastatic adenopathy. RECOMMENDATION: Ultrasound-guided core needle biopsy of the 3 right breast masses and 1 of the thickened right axillary lymph nodes. Patient will be scheduled for 2 separate days. On the first biopsy day, recommend biopsy of the right breast mass 12 o'clock position and 1 of the thickened right axillary lymph nodes. On the second biopsy day, recommend biopsy of the right breast masses 11 o'clock position and 1 o'clock position. I have discussed the findings and recommendations with the patient. If applicable, a reminder letter will be sent to the patient regarding the next appointment. BI-RADS CATEGORY  5: Highly suggestive of malignancy. Electronically Signed   By: Lovey Newcomer M.D.   On: 01/31/2020 13:37    Pelvic/Bimanual Pap is not indicated today per BCCCP guidelines.    Smoking History: Patient has never smoked.  Patient Navigation: Patient education provided. Access to services provided for patient through [facility] program.   Colorectal Cancer Screening: Per patient has never had  colonoscopy completed. FIT Test completed 02/07/2021 that was negative. FIT Test given to patient today to complete. No complaints today.    Breast and Cervical Cancer Risk Assessment: Patient does not have family history of breast cancer. Patient has personal history of breast cancer. Patient has no known genetic mutations or history of radiation treatment to the chest before age 50. Patient does not have history of cervical dysplasia, immunocompromised, or DES exposure in-utero.  Risk Assessment     Risk Scores       03/04/2022 05/21/2021   Last edited by: Royston Bake, CMA Klyn Kroening, Heath Gold, RN   5-year risk: 1.6 % 1.6 %   Lifetime risk: 6.3 % 6.3 %           A: BCCCP exam without pap smear No complaints.  P: Referred patient to the [facility] for a diagnostic mammogram per recommendation due to patients history of breast cancer. Appointment scheduled Thursday, March 27, 2022 at [facility].  Loletta Parish, RN 03/04/2022 3:00 PM

## 2022-03-05 ENCOUNTER — Ambulatory Visit: Payer: No Typology Code available for payment source

## 2022-03-05 DIAGNOSIS — M6281 Muscle weakness (generalized): Secondary | ICD-10-CM

## 2022-03-05 DIAGNOSIS — R600 Localized edema: Secondary | ICD-10-CM

## 2022-03-05 DIAGNOSIS — G8929 Other chronic pain: Secondary | ICD-10-CM

## 2022-03-05 DIAGNOSIS — R262 Difficulty in walking, not elsewhere classified: Secondary | ICD-10-CM

## 2022-03-05 NOTE — Therapy (Addendum)
OUTPATIENT PHYSICAL THERAPY TREATMENT NOTE/ RE-CERTIFICATION/ DISCHARGE SUMMARY   Patient Name: [name] [name] MRN: [id number] DOB:[birth year], 66 y.o., female Today's Date: 03/05/2022  PCP: Sandrea Hughs, NP REFERRING PROVIDER: Aundra Dubin, PA-C  END OF SESSION:   PT End of Session - 03/05/22 1827     Visit Number 12    Number of Visits 16    Date for PT Re-Evaluation 04/09/22    Authorization Type None    Authorization Time Period FOTO v6, v10    PT Start Time 1828    PT Stop Time 1906    PT Time Calculation (min) 38 min    Activity Tolerance Patient tolerated treatment well    Behavior During Therapy WFL for tasks assessed/performed                   Past Medical History:  Diagnosis Date   Arthritis    Breast cancer ([place])    Cancer ([place])    breast   Diabetes mellitus without complication ([place])    History of MRI    Knee, Hip, and Back.   Hypertension    Personal history of chemotherapy    Personal history of radiation therapy    Past Surgical History:  Procedure Laterality Date   BREAST BIOPSY Right 02/03/2020   x2   BREAST BIOPSY Right 02/06/2020   x2   BREAST LUMPECTOMY Right 07/26/2020   BREAST LUMPECTOMY WITH RADIOACTIVE SEED AND SENTINEL LYMPH NODE BIOPSY Right 07/26/2020   Procedure: RIGHT BREAST LUMPECTOMY WITH RADIOACTIVE SEED X 2 AND SENTINEL LYMPH NODE BIOPSY;  Surgeon: Erroll Luna, MD;  Location: [facility];  Service: General;  Laterality: Right;   IR IMAGING GUIDED PORT INSERTION  02/23/2020   PORT-A-CATH REMOVAL N/A 07/26/2020   Procedure: REMOVAL PORT-A-CATH;  Surgeon: Erroll Luna, MD;  Location: [facility];  Service: General;  Laterality: N/A;   TOTAL KNEE ARTHROPLASTY Left 11/18/2021   Procedure: LEFT TOTAL KNEE ARTHROPLASTY;  Surgeon: Leandrew Koyanagi, MD;  Location: [facility];  Service: Orthopedics;  Laterality: Left;   Patient Active Problem List   Diagnosis Date Noted   Primary osteoarthritis  of right knee 02/11/2022   Status post total left knee replacement 11/18/2021   Primary osteoarthritis of left knee 08/06/2021   Aortic atherosclerosis ([place]) 04/24/2020   Deep venous thrombosis of left upper extremity ([place]) 04/24/2020   Port-A-Cath in place 03/26/2020   Malignant neoplasm of upper-outer quadrant of right breast in female, estrogen receptor positive ([place]) 02/13/2020    REFERRING DIAG: H54.562 (ICD-10-CM) - S/P total knee arthroplasty, left  THERAPY DIAG:  Chronic pain of left knee  Localized edema  Difficulty in walking, not elsewhere classified  Muscle weakness (generalized)  Rationale for Evaluation and Treatment Rehabilitation  PERTINENT HISTORY: Lt TKA on 11/18/2021 with Dr. Frankey Shown, DMII, HTN, Hx of breast cx with chemo/ radiation/ surgery (in remission)  PRECAUTIONS: Knee and Fall  ONSET DATE: 11/18/2021  SUBJECTIVE: Pt reports continues low-level pain today, adding that she has been adherent to her HEP.  PAIN:  Are you having pain? Yes: NPRS scale: 2-3/10 Lt knee Pain location: Lt knee Pain description: Achy, sharp Aggravating factors: walking >5 minutes with FWW, standing after prolonged sitting, and bending knee Relieving factors: ice, elevation, and pain medication   OBJECTIVE: (objective measures completed at initial evaluation unless otherwise dated)   DIAGNOSTIC FINDINGS: N/A   PATIENT SURVEYS:  FOTO 38%, predicted 61% in 13 visits  02/05/2022: 40%  02/19/2022:  57%   COGNITION:           Overall cognitive status: Within functional limits for tasks assessed                          SENSATION: Light touch: Lt L2-S2 dermatomes impaired vs Rt   EDEMA:  Circumferential: 46.5cm on Rt, 52cm on Lt     POSTURE: flexed trunk  and weight shift right   PALPATION: TTP along Lt surgical incision   LOWER EXTREMITY ROM:   A/PROM Right eval Left eval 12/24/21 Left 12/26/21 Left 12/31/21 Left 01/02/2022 Left 02/05/2022 Left  02/19/2022 Left  Knee flexion 110 80p!,90p! 100/109 p! 105/110 p! 106/111p! 107, 115p! PROM following MET 109, 116p! 114, 121p!  Knee extension 2 -9p!, -5p! -3   -4 0/ 1p! 0, 2p!   (Blank rows = not tested)   LOWER EXTREMITY MMT:   MMT Right eval Left eval Right 02/05/2022 Left 02/05/2022  Hip flexion 4/5 3/5 4+/5 4+/5  Hip extension 3/5 3/5 4/5 4/5  Hip abduction 3+/5 3/5 4/5, p! In Rt knee 4/5  Knee flexion 5/5 3/p! 5/5 5/5  Knee extension 3+/5p! 2+/5p! 4+/5p! 5/5p!   (Blank rows = not tested)     FUNCTIONAL TESTS:  5xSTS: 29 seconds SLR: roughly 10 degrees of quad lag throughout motion  02/05/2022: 5xSTS: 20 seconds with UE support SLR: WNL x10   GAIT: Distance walked: 96f Assistive device utilized: WEnvironmental consultant- 2 wheeled Level of assistance: Modified independence Comments: Decreased gait speed, Lt antalgic gait with forward posture       TODAY'S TREATMENT:  [facility] Adult PT Treatment:                                                DATE: 03/05/2022 Therapeutic Exercise: Exercise bike with successive lowering while collecting subjective information x5 min Supine SLR with hip abduction with YTB around ankles 3x10 BIL Supine bicycles 3x20 15# kettlebell deadlift 3x10 Standing hamstring curls with 7# cable 3x10 BIL Seated LAQ with YTB around ankles 2x10 with 3-sec hold BIL Squat with alternating sumo abduction with GtB around thighs 3x10 Seated BIL hamstring stretch x25m Manual Therapy: N/A Neuromuscular re-ed: N/A Therapeutic Activity: N/A Modalities: N/A Self Care: N/A   [facility] Adult PT Treatment:                                                DATE: 02/26/2022 Therapeutic Exercise: Supine Lt heel slide with green strap 2x10 with 5-sec hold at end range Supine Lt hamstring stretch with strap [id number] Lt SLR with hip abduction with 2.5# ankle weight 2x10  Deadlift with 15# kettlebell 3x10 Standing hamstring curl with 7# cable 2x10 BIL Standing hip flexion  isometric with knee extension with 3# cable to ankle 2x10 BIL Manual Therapy: N/A Neuromuscular re-ed: N/A Therapeutic Activity: N/A Modalities: N/A Self Care: N/A   [facility] Adult PT Treatment:                                                DATE:  02/19/2022 Therapeutic Exercise: Supine Lt knee flexion AAROM with green strap 2x10 with 5-sec hold at end-range Seated knee extension from 90d flexion to 30d flexion with 15# 3x10 Seated hamstring curls with 0d flexion to 60d flexion with 35# 3x10 Standing Cybex hip abduction with 30# 2x10 BIL Seated BIL active hamstring stretch x28mn Manual Therapy: N/A Neuromuscular re-ed: N/A Therapeutic Activity: Re-administration of FOTO with pt education Re-assessment of objective measures with pt education 4-inch forward step-down with slow lowering and backward step-up, 3x10 Lt stance leg Mini dead lift with 25# kettlebell 3x8 with cues for functional lifting technique Modalities: N/A Self Care: N/A      PATIENT EDUCATION:  Education details: Pt educated on prognosis, POC, FOTO, and HEP Person educated: Patient Education method: EConsulting civil engineer DMedia planner and Handouts Education comprehension: verbalized understanding and returned demonstration     HOME EXERCISE PROGRAM: Access Code: [email or web address]: [email or web address] Date: 12/06/2021 Prepared by: TVanessa [place]  Exercises - Straight Leg Raise  - 2 x daily - 7 x weekly - 3 sets - 10 reps - 3-sec hold - Heel slide with strap, LAYING ON BACK  - 2 x daily - 7 x weekly - 3 sets - 10 reps - 5-sec hold - Seated Hamstring Stretch  - 2 x daily - 7 x weekly - 2 sets - 1-min hold - Step Up  - 2 x daily - 7 x weekly - 3 sets - 20 reps - 4 Way Patellar Glide  - 2 x daily - 7 x weekly - 2 sets - 10 reps   ASSESSMENT:   CLINICAL IMPRESSION: Pt responded well to all interventions today, demonstrating good form and no increase in pain with progressed exercises. She will  continue to benefit from skilled PT to address her primary impairments and return to her prior level of function with less limitation.   OBJECTIVE IMPAIRMENTS Abnormal gait, decreased balance, decreased coordination, decreased endurance, decreased knowledge of use of DME, decreased mobility, difficulty walking, decreased ROM, decreased strength, hypomobility, increased edema, impaired flexibility, improper body mechanics, postural dysfunction, and pain.    ACTIVITY LIMITATIONS carrying, lifting, bending, sitting, standing, squatting, sleeping, stairs, transfers, bed mobility, toileting, dressing, and locomotion level   PARTICIPATION LIMITATIONS: cleaning, laundry, interpersonal relationship, driving, shopping, community activity, occupation, and yard work   PERSONAL FACTORS 3+ comorbidities: See medical hx  are also affecting patient's functional outcome.        GOALS: Goals reviewed with patient? Yes   SHORT TERM GOALS: Target date: 01/03/2022  Pt will report understanding and adherence to initial HEP in order to promote independence in the management of primary impairments. Baseline: HEP provided at eval Goal status: MET Pt reports adherence 12/31/21   2.  Pt will achieve Lt knee AROM of 0-100 degrees in order to progress to future stage of post-op protocol. Baseline: -9-80 02/05/2022: 0-109 Goal status: ACHIEVED   3.  Pt will achieve 10 SLR with no quad lag in order to progress to future stage of post-op protocol. Baseline: 10 degrees of quad lag with 1 rep 02/05/2022: WNL x10 Goal status: ACHIEVED     LONG TERM GOALS: Target date: 01/31/2022    Pt will achieve FOTO score of 61% in order to demonstrate improved functional ability as it relates to her primary knee impairments. Baseline: 38% 02/05/2022: 40% 02/19/2022: 57% Goal status: IN PROGRESS   2.  Pt will achieve Lt knee AROM of 0-120 degrees in order to establish a normal gait pattern for community ambulation. Baseline:  -  9-80 02/05/2022: 0-109 02/19/2022: 0-114 Goal status: IN PROGRESS   3.  Pt will achieve global BIL LE strength of 4+/5 or greater in order to progress her independent LE strengthening regimen with less limitation. Baseline: See MMT chart 02/05/2022: See updated MMT chart Goal status: IN PROGRESS   4.  Pt will report ability to walk > 20 minutes without AD in order to grocery shop with less limitation. Baseline: 5 minutes with FWW 02/05/2022: 5 minutes without AD Goal status: IN PROGRESS   5.  Pt will achieve a 5xSTS in 14 seconds or less without UE support in order to demonstrate safe transfers. Baseline: 29 seconds with UE support  02/05/2022: 20 seconds with UE support Goal status: IN PROGRESS       PLAN: PT FREQUENCY: 1x/week   PT DURATION: 8 weeks   PLANNED INTERVENTIONS: Therapeutic exercises, Therapeutic activity, Neuromuscular re-education, Balance training, Gait training, Patient/Family education, Self Care, Joint mobilization, Stair training, Orthotic/Fit training, DME instructions, Aquatic Therapy, Dry Needling, Electrical stimulation, Cryotherapy, Taping, Vasopneumatic device, Biofeedback, Ionotophoresis 62m/ml Dexamethasone, Manual therapy, and Re-evaluation   PLAN FOR NEXT SESSION: Progress early knee strengthening/ ROM in accordance with post-op protocol   YVanessa Celina PT, DPT 03/05/22 7:06 PM    PHYSICAL THERAPY DISCHARGE SUMMARY  Visits from Start of Care: 12  Current functional level related to goals / functional outcomes: Pt has made good progress in knee ROM, LE strength, FOTO score, and 5xSTS.   Remaining deficits: Pt continues to have deficits in above measures.   Education / Equipment: HEP   Patient agrees to discharge. Patient goals were partially met. Patient is being discharged due to not returning since the last visit.  YVanessa Jal PT, DPT 05/29/22 1:47 PM

## 2022-03-07 ENCOUNTER — Other Ambulatory Visit (HOSPITAL_COMMUNITY): Payer: Self-pay

## 2022-03-07 NOTE — Telephone Encounter (Signed)
Oral Oncology Patient Advocate Encounter   Submitted application for assistance for Verzenio to [employer].   Application submitted via e-fax to [facility] phone number [phone].   I will continue to check the status until final determination.   [name], CPhT-Adv Oncology Pharmacy Patient [place] Direct Number: [phone]  Fax: [phone]

## 2022-03-08 LAB — SPECIMEN STATUS REPORT

## 2022-03-08 LAB — FECAL OCCULT BLOOD, IMMUNOCHEMICAL: Fecal Occult Bld: NEGATIVE

## 2022-03-11 NOTE — Telephone Encounter (Signed)
Oral Oncology Patient Advocate Encounter  Called to check status of assistance application for Verzenio through [employer].  Patient is enrolled until 08/10/22  Re-enrollment can be processed in February.  I will reopen the encounter at the time of re-enrollment,  [name], [facility] Patient [place] Direct Number: [phone]  Fax: [phone]

## 2022-03-14 ENCOUNTER — Telehealth: Payer: Self-pay

## 2022-03-14 NOTE — Telephone Encounter (Signed)
Patient's daughter, [name], informed negative FIT test results, verbalized understanding.

## 2022-03-20 ENCOUNTER — Inpatient Hospital Stay: Payer: No Typology Code available for payment source | Admitting: Pharmacist

## 2022-03-20 ENCOUNTER — Inpatient Hospital Stay: Payer: No Typology Code available for payment source | Attending: Hematology and Oncology

## 2022-03-20 ENCOUNTER — Other Ambulatory Visit: Payer: Self-pay

## 2022-03-20 VITALS — BP 104/62 | HR 65 | Temp 98.1°F | Resp 18 | Ht 66.0 in | Wt 201.5 lb

## 2022-03-20 DIAGNOSIS — C50411 Malignant neoplasm of upper-outer quadrant of right female breast: Secondary | ICD-10-CM | POA: Insufficient documentation

## 2022-03-20 DIAGNOSIS — Z170 Estrogen receptor positive status [ER+]: Secondary | ICD-10-CM | POA: Insufficient documentation

## 2022-03-20 DIAGNOSIS — Z79811 Long term (current) use of aromatase inhibitors: Secondary | ICD-10-CM | POA: Insufficient documentation

## 2022-03-20 LAB — CBC WITH DIFFERENTIAL (CANCER CENTER ONLY)
Abs Immature Granulocytes: 0.01 10*3/uL (ref 0.00–0.07)
Basophils Absolute: 0 10*3/uL (ref 0.0–0.1)
Basophils Relative: 1 %
Eosinophils Absolute: 0 10*3/uL (ref 0.0–0.5)
Eosinophils Relative: 1 %
HCT: 26.2 % — ABNORMAL LOW (ref 36.0–46.0)
Hemoglobin: 9 g/dL — ABNORMAL LOW (ref 12.0–15.0)
Immature Granulocytes: 0 %
Lymphocytes Relative: 39 %
Lymphs Abs: 1.1 10*3/uL (ref 0.7–4.0)
MCH: 35.4 pg — ABNORMAL HIGH (ref 26.0–34.0)
MCHC: 34.4 g/dL (ref 30.0–36.0)
MCV: 103.1 fL — ABNORMAL HIGH (ref 80.0–100.0)
Monocytes Absolute: 0.1 10*3/uL (ref 0.1–1.0)
Monocytes Relative: 5 %
Neutro Abs: 1.5 10*3/uL — ABNORMAL LOW (ref 1.7–7.7)
Neutrophils Relative %: 54 %
Platelet Count: 177 10*3/uL (ref 150–400)
RBC: 2.54 MIL/uL — ABNORMAL LOW (ref 3.87–5.11)
RDW: 13.2 % (ref 11.5–15.5)
WBC Count: 2.8 10*3/uL — ABNORMAL LOW (ref 4.0–10.5)
nRBC: 0 % (ref 0.0–0.2)

## 2022-03-20 LAB — CMP (CANCER CENTER ONLY)
ALT: 15 U/L (ref 0–44)
AST: 18 U/L (ref 15–41)
Albumin: 4 g/dL (ref 3.5–5.0)
Alkaline Phosphatase: 88 U/L (ref 38–126)
Anion gap: 6 (ref 5–15)
BUN: 10 mg/dL (ref 8–23)
CO2: 25 mmol/L (ref 22–32)
Calcium: 9.3 mg/dL (ref 8.9–10.3)
Chloride: 106 mmol/L (ref 98–111)
Creatinine: 1.08 mg/dL — ABNORMAL HIGH (ref 0.44–1.00)
GFR, Estimated: 58 mL/min — ABNORMAL LOW (ref >60)
Glucose, Bld: 100 mg/dL — ABNORMAL HIGH (ref 70–99)
Potassium: 5.2 mmol/L — ABNORMAL HIGH (ref 3.5–5.1)
Sodium: 137 mmol/L (ref 135–145)
Total Bilirubin: 0.4 mg/dL (ref 0.3–1.2)
Total Protein: 6.7 g/dL (ref 6.5–8.1)

## 2022-03-20 NOTE — Progress Notes (Signed)
[facility]       Telephone: [phone]?Fax: [phone]   Oncology Clinical Pharmacist Practitioner Progress Note  [name] [name] was contacted via in person to discuss her chemotherapy regimen for abemaciclib which they receive under the care of Dr. Benay Pike.   Current treatment regimen and start date Abemaciclib (08/15/21) 100 mg BID (12/25/21) -- went back down d/t side effects at 150 mg BID 150 mg BID (11/26/21) 100 mg BID (08/15/21)   Anastrozole (10/31/20)   Interval History She continues on abemaciclib 100 mg by mouth every 12 hours on days 1 to 28 of a 28-day cycle. This is being given in combination with anastrozole. Therapy is planned to continue until two years in the adjuvant setting per the [facility] E trial data. [name] [name] was seen today by clinical pharmacy as a follow-up to her abemaciclib management.  She was last seen by Dr. Chryl Heck on 02/26/22 and clinical pharmacy on 01/23/22.  She is tolerating the abemaciclib 100 mg dose reduction much better. She has her last dental procedure on 04/08/22 and can likely start an oral bisphosphonate for osteoporosis three months after. Of note, she explained today that she does not have insurance so she will likely need some assistance.   Response to Therapy [name] [name] is doing well.  She is tolerating the 100 mg abemaciclib dose much better.  She states that she is having very mild diarrhea which is controlled with loperamide.  She is having some nausea but she is not interested in taking any antinausea meds.  She states that she lays down it resolves.  He is due for her last dental procedure on 04/08/22 and 3 months later she can likely start an oral bisphosphonate due to her osteoporosis.  Her potassium today is slightly elevated at 5.2 mmol/L.  She does report eating several bananas per day.  We recommended that she reduce this to 1 a day and this will be reevaluated with Dr. Chryl Heck at her next appointment on 04/24/22.  After  that visit she can likely be seen in clinic every 3 months by either Dr. Chryl Heck or clinical pharmacy. Labs, vitals, treatment parameters, and manufacturer guidelines assessing toxicity were reviewed with [name] [name] today. Based on these values, patient is in agreement to continue abemaciclib therapy at this time.  Allergies Allergies  Allergen Reactions   Sulfa Antibiotics Itching    Vitals    03/20/2022    2:06 PM 03/04/2022    3:01 PM 02/26/2022    3:54 PM  Oncology Vitals  Height 168 cm  168 cm  Weight 91.4 kg 92.987 kg 91.989 kg  Weight (lbs) 201 lbs 8 oz 205 lbs 202 lbs 13 oz  BMI 32.52 kg/m2   32.52 kg/m2 33.09 kg/m2   33.09 kg/m2 32.73 kg/m2   32.73 kg/m2  Temp 98.1 F (36.7 C)  97.9 F (36.6 C)  Pulse Rate 65  63  BP 104/62 102/68 140/81  Resp 18  16  SpO2 100 %  100 %  BSA (m2) 2.06 m2   2.06 m2 2.08 m2   2.08 m2 2.07 m2   2.07 m2    Laboratory Data    Latest Ref Rng & Units 03/20/2022    1:41 PM 02/26/2022    3:42 PM 01/23/2022    2:37 PM  CBC EXTENDED  WBC 4.0 - 10.5 K/uL 2.8  3.7  3.8   RBC 3.87 - 5.11 MIL/uL 2.54  2.76  2.71   Hemoglobin  12.0 - 15.0 g/dL 9.0  9.8  9.5   HCT 36.0 - 46.0 % 26.2  28.8  28.3   Platelets 150 - 400 K/uL 177  226  222   NEUT# 1.7 - 7.7 K/uL 1.5  1.9  2.2   Lymph# 0.7 - 4.0 K/uL 1.1  1.4  1.1        Latest Ref Rng & Units 03/20/2022    1:41 PM 02/26/2022    3:42 PM 01/23/2022    2:37 PM  CMP  Glucose 70 - 99 mg/dL 100  94  127   BUN 8 - 23 mg/dL '10  10  10   '$ Creatinine 0.44 - 1.00 mg/dL 1.08  1.06  0.91   Sodium 135 - 145 mmol/L 137  138  134   Potassium 3.5 - 5.1 mmol/L 5.2  4.4  4.3   Chloride 98 - 111 mmol/L 106  108  101   CO2 22 - 32 mmol/L '25  26  27   '$ Calcium 8.9 - 10.3 mg/dL 9.3  9.6  9.6   Total Protein 6.5 - 8.1 g/dL 6.7  7.4  6.7   Total Bilirubin 0.3 - 1.2 mg/dL 0.4  0.3  0.2   Alkaline Phos 38 - 126 U/L 88  92  76   AST 15 - 41 U/L '18  25  12   '$ ALT 0 - 44 U/L '15  24  8     '$ No results found for:  "MG" No results found for: "CA2729"   Adverse Effects Assessment Diarrhea: Very mild.  Using loperamide with effect. Nausea: Very mild.  Not using any antinausea meds and not interested in taking anything at this time. Potassium elevation: Likely not due to abemaciclib.  We will continue to monitor.  She has been eating several bananas daily and we told her to reduce that to once daily for now. Serum creatinine elevation: This continues to be stable.  She does state that she has issues with drinking enough fluid.  We did give her some recommendations with Crystal light or sugar-free electrolyte containing fluids such as Gatorade, Pedialyte, Powerade, etc.  Adherence Assessment [name] [name] reports missing 0 doses over the past 4 weeks.   Reason for missed dose: N/A Patient was re-educated on importance of adherence.   Access Assessment [name] [name] is currently receiving her abemaciclib through [employer] concerns: None  Medication Reconciliation The patient's medication list was reviewed today with the patient?  Yes New medications or herbal supplements have recently been started?  No Any medications have been discontinued?  No The medication list was updated and reconciled based on the patient's most recent medication list in the electronic medical record (EMR) including herbal products and OTC medications.   Medications Current Outpatient Medications  Medication Sig Dispense Refill   abemaciclib (VERZENIO) 100 MG tablet Take 1 tablet (100 mg total) by mouth 2 (two) times daily. 60 tablet 3   anastrozole (ARIMIDEX) 1 MG tablet Take 1 tablet (1 mg total) by mouth daily. 90 tablet 4   atorvastatin (LIPITOR) 40 MG tablet Take 1 tablet (40 mg total) by mouth daily. 90 tablet 1   calcium-vitamin D (OSCAL WITH D) 500-5 MG-MCG tablet Take 1 tablet by mouth daily. Will bring in dosing to next visit     carvedilol (COREG) 6.25 MG tablet Take 1 tablet (6.25 mg total) by  mouth 2 (two) times daily with a meal. 180 tablet 1   diclofenac (VOLTAREN) 75  MG EC tablet Take 1 tablet (75 mg total) by mouth 2 (two) times daily. 60 tablet 5   lansoprazole (PREVACID) 30 MG capsule Take 30 mg by mouth every morning.     Magnesium 100 MG CAPS Take 100 mg by mouth 2 (two) times daily.     metFORMIN (GLUCOPHAGE) 1000 MG tablet Take 1 tablet (1,000 mg total) by mouth 2 (two) times daily with a meal. 180 tablet 1   Multiple Vitamins-Minerals (CENTRUM SILVER 50+WOMEN PO) Take 1 capsule by mouth daily.     UNABLE TO FIND Med Name: [place]. Pt reports taking 1 scoopful in tea or water     valsartan (DIOVAN) 320 MG tablet Take 1 tablet (320 mg total) by mouth daily. 90 tablet 1   vitamin C (ASCORBIC ACID) 500 MG tablet Take 500 mg by mouth daily.     No current facility-administered medications for this visit.    Drug-Drug Interactions (DDIs) DDIs were evaluated?  Yes Significant DDIs?  No The patient was instructed to speak with their health care provider and/or the oral chemotherapy pharmacist before starting any new drug, including prescription or over the counter, natural / herbal products, or vitamins.  Supportive Care Diarrhea: we reviewed that diarrhea is common with abemaciclib and confirmed that she does have loperamide (Imodium) at home.  We reviewed how to take this medication PRN. Neutropenia: we discussed the importance of having a thermometer and what the Centers for Disease Control and Prevention (CDC) considers a fever which is 100.94F (38C) or higher.  Gave patient 24/7 triage line to call if any fevers or symptoms. ILD/Pneumonitis: we reviewed potential symptoms including cough, shortness, and fatigue.  VTE: reviewed signs of DVT such as leg swelling, redness, pain, or tenderness and signs of PE such as shortness of breath, rapid or irregular heartbeat, cough, chest pain, or lightheadedness. Reviewed to take the medication every 12 hours (with food sometimes can  be easier on the stomach) and to take it at the same time every day.  Dosing Assessment Hepatic adjustments needed?  No Renal adjustments needed?  No Toxicity adjustments needed?  No The current dosing regimen is appropriate to continue at this time.  Follow-Up Plan Continue abemaciclib 100 mg every 12 hours.  She was on the higher dose but did not tolerated.  Continue anastrozole Continue anastrozole 1 mg daily She should have her last dental procedure on 04/08/22 and can likely start an oral bisphosphonate around 3 months after that time due to her osteoporosis Monitor potassium.  Slightly elevated at 5.2 today she was eating several bananas a day and we discussed reducing this to a 1 banana a day. Monitor serum creatinine: Encouraged her to drink more fluids gave her some different options to choose from. Labs, Dr. Chryl Heck visit, on 04/24/22.  After this time, [name] [name] can likely be seen every 3 months or as clinically indicated. We will add labs, clinical pharmacy visit, in mid March.  [name] [name] participated in the discussion, expressed understanding, and voiced agreement with the above plan. All questions were answered to her satisfaction. The patient was advised to contact the clinic at [phone] with any questions or concerns prior to her return visit.   I spent 30 minutes assessing and educating the patient.  Raina Mina, RPH-CPP, 03/20/2022  2:40 PM   **Disclaimer: This note was dictated with voice recognition software. Similar sounding words can inadvertently be transcribed and this note may contain transcription errors which may not have  been corrected upon publication of note.**

## 2022-03-21 ENCOUNTER — Telehealth: Payer: Self-pay | Admitting: *Deleted

## 2022-03-21 ENCOUNTER — Telehealth: Payer: Self-pay | Admitting: Pharmacist

## 2022-03-21 NOTE — Telephone Encounter (Signed)
This RN called pt per need to review medication and supplement intake due to mildly high potassium.  Obtained identified VM- message left requesting a return call to this RN

## 2022-03-21 NOTE — Telephone Encounter (Signed)
Scheduled appointment per 11/9 los. Left voicemail.

## 2022-03-24 ENCOUNTER — Other Ambulatory Visit: Payer: Self-pay

## 2022-03-24 MED ORDER — METFORMIN HCL 1000 MG PO TABS: 1000.0000 mg | ORAL_TABLET | Freq: Two times a day (BID) | ORAL | 1 refills | Status: DC

## 2022-03-24 MED ORDER — LANSOPRAZOLE 30 MG PO CPDR: 30.0000 mg | DELAYED_RELEASE_CAPSULE | Freq: Every morning | ORAL | 1 refills | Status: DC

## 2022-03-25 ENCOUNTER — Other Ambulatory Visit: Payer: Self-pay | Admitting: Hematology and Oncology

## 2022-03-25 ENCOUNTER — Other Ambulatory Visit: Payer: Self-pay | Admitting: *Deleted

## 2022-03-25 DIAGNOSIS — E875 Hyperkalemia: Secondary | ICD-10-CM

## 2022-03-25 DIAGNOSIS — C50411 Malignant neoplasm of upper-outer quadrant of right female breast: Secondary | ICD-10-CM

## 2022-03-26 ENCOUNTER — Other Ambulatory Visit: Payer: Self-pay

## 2022-03-26 ENCOUNTER — Inpatient Hospital Stay: Payer: No Typology Code available for payment source

## 2022-03-26 DIAGNOSIS — E875 Hyperkalemia: Secondary | ICD-10-CM

## 2022-03-26 LAB — CBC WITH DIFFERENTIAL (CANCER CENTER ONLY)
Abs Immature Granulocytes: 0.01 10*3/uL (ref 0.00–0.07)
Basophils Absolute: 0 10*3/uL (ref 0.0–0.1)
Basophils Relative: 2 %
Eosinophils Absolute: 0.1 10*3/uL (ref 0.0–0.5)
Eosinophils Relative: 2 %
HCT: 25.6 % — ABNORMAL LOW (ref 36.0–46.0)
Hemoglobin: 8.6 g/dL — ABNORMAL LOW (ref 12.0–15.0)
Immature Granulocytes: 0 %
Lymphocytes Relative: 36 %
Lymphs Abs: 0.9 10*3/uL (ref 0.7–4.0)
MCH: 34.7 pg — ABNORMAL HIGH (ref 26.0–34.0)
MCHC: 33.6 g/dL (ref 30.0–36.0)
MCV: 103.2 fL — ABNORMAL HIGH (ref 80.0–100.0)
Monocytes Absolute: 0.2 10*3/uL (ref 0.1–1.0)
Monocytes Relative: 7 %
Neutro Abs: 1.4 10*3/uL — ABNORMAL LOW (ref 1.7–7.7)
Neutrophils Relative %: 53 %
Platelet Count: 158 10*3/uL (ref 150–400)
RBC: 2.48 MIL/uL — ABNORMAL LOW (ref 3.87–5.11)
RDW: 13.1 % (ref 11.5–15.5)
WBC Count: 2.6 10*3/uL — ABNORMAL LOW (ref 4.0–10.5)
nRBC: 0 % (ref 0.0–0.2)

## 2022-03-26 LAB — CMP (CANCER CENTER ONLY)
ALT: 22 U/L (ref 0–44)
AST: 23 U/L (ref 15–41)
Albumin: 3.6 g/dL (ref 3.5–5.0)
Alkaline Phosphatase: 78 U/L (ref 38–126)
Anion gap: 4 — ABNORMAL LOW (ref 5–15)
BUN: 9 mg/dL (ref 8–23)
CO2: 26 mmol/L (ref 22–32)
Calcium: 9.5 mg/dL (ref 8.9–10.3)
Chloride: 110 mmol/L (ref 98–111)
Creatinine: 0.93 mg/dL (ref 0.44–1.00)
GFR, Estimated: >60 mL/min (ref >60)
Glucose, Bld: 89 mg/dL (ref 70–99)
Potassium: 5.8 mmol/L — ABNORMAL HIGH (ref 3.5–5.1)
Sodium: 140 mmol/L (ref 135–145)
Total Bilirubin: 0.4 mg/dL (ref 0.3–1.2)
Total Protein: 6.8 g/dL (ref 6.5–8.1)

## 2022-03-27 ENCOUNTER — Ambulatory Visit
Admission: RE | Admit: 2022-03-27 | Discharge: 2022-03-27 | Disposition: A | Discharge status: Home / Self Care | Payer: No Typology Code available for payment source | Source: Ambulatory Visit | Attending: Obstetrics and Gynecology | Admitting: Obstetrics and Gynecology

## 2022-03-27 DIAGNOSIS — Z853 Personal history of malignant neoplasm of breast: Secondary | ICD-10-CM

## 2022-03-28 ENCOUNTER — Telehealth: Payer: Self-pay | Admitting: *Deleted

## 2022-03-28 NOTE — Telephone Encounter (Addendum)
-----   Message from Benay Pike, MD sent at 03/26/2022  2:47 PM EST ----- Potassium is slightly higher,  she doesn't appear to be on any potassium supplement, can you talk to her and make sure she is not taking any. Lets do BMP in 2 weeks to check back on potassium  Thanks,  Per call to pt this RN spoke with her daughter- she states her mother is not on any potassium per prescription or by supplement.  " Mom says she eats a lot of bananas"  Per MD review- inquiry request to pharmacy for possible medication cause while pt is on Verzenio- this note will be forwarded to Gilford Rile for review and possible need for follow up per above.  Pt is presently scheduled for next lab on 12/14- will move to sooner post review by pharmacy if needed.

## 2022-04-02 ENCOUNTER — Telehealth: Payer: Self-pay | Admitting: *Deleted

## 2022-04-02 ENCOUNTER — Other Ambulatory Visit: Payer: Self-pay | Admitting: *Deleted

## 2022-04-02 NOTE — Telephone Encounter (Signed)
This RN called pt and spoke with her daughter- discussed potassium level and likely secondary to the recent start of the Valsartan.  Plan is to monitor presently- with recheck scheduled 12/11 and MD follow up 12/14.  No further needs at this time.

## 2022-04-04 ENCOUNTER — Encounter: Payer: Self-pay | Admitting: Oncology

## 2022-04-09 ENCOUNTER — Other Ambulatory Visit: Payer: Self-pay | Admitting: Pharmacist

## 2022-04-09 ENCOUNTER — Inpatient Hospital Stay: Payer: No Typology Code available for payment source | Admitting: Pharmacist

## 2022-04-09 ENCOUNTER — Encounter: Payer: Self-pay | Admitting: Hematology and Oncology

## 2022-04-09 DIAGNOSIS — C50411 Malignant neoplasm of upper-outer quadrant of right female breast: Secondary | ICD-10-CM

## 2022-04-09 MED ORDER — ANASTROZOLE 1 MG PO TABS: 1.0000 mg | ORAL_TABLET | Freq: Every day | ORAL | 4 refills | Status: DC

## 2022-04-09 NOTE — Progress Notes (Signed)
[facility]       Telephone: [phone]?Fax: [phone]   Oncology Clinical Pharmacist Practitioner Progress Note  [name] [name] is a 66 y.o. female with a diagnosis of breast cancer currently on adjuvant abemaciclib and anastrozole under the care of [name] [name].   I connected with [name] [name] daughter today by telephone and verified that I was speaking with the correct person using two patient identifiers. Her daughter is involved with her care and allowed to speak on her behalf if needed.  Other persons participating in the visit and their role in the encounter: N/A   Patient's location: home  Provider's location: clinic  [name] [name] had contacted clinical pharmacy and left a voicemail requesting that her anastrozole be sent to [facility] on [address] since her [place] was out of stock on the medication. We have [facility] per her request. Contacted [name] [name]'s phone number and daughter answered. Explained what had been done and told them to be contact [name] [name] clinic for any other questions or concerns. They next see [name] [name] with labs on 04/24/22.   [name] [name] daughter participated in the discussion, expressed understanding, and voiced agreement with the above plan. All questions were answered to her satisfaction. The patient's daughter was advised to contact the clinic at [phone] with any questions or concerns prior to her return visit.  Clinical pharmacy will continue to support [name] [name] and [name] [name] as needed.  Raina Mina, RPH-CPP,  04/09/2022  12:40 PM   **Disclaimer: This note was dictated with voice recognition software. Similar sounding words can inadvertently be transcribed and this note may contain transcription errors which may not have been corrected upon publication of note.**

## 2022-04-11 ENCOUNTER — Other Ambulatory Visit (HOSPITAL_COMMUNITY): Payer: Self-pay

## 2022-04-11 ENCOUNTER — Other Ambulatory Visit: Payer: Self-pay | Admitting: Pharmacist

## 2022-04-11 ENCOUNTER — Encounter: Payer: Self-pay | Admitting: Oncology

## 2022-04-11 MED ORDER — ANASTROZOLE 1 MG PO TABS
1.0000 mg | ORAL_TABLET | Freq: Every day | ORAL | 4 refills | Status: DC
  Filled 2022-04-11: qty 30, 30d supply, fill #0
  Filled 2022-05-13 (×2): qty 30, 30d supply, fill #1
  Filled 2022-06-10: qty 30, 30d supply, fill #2
  Filled 2022-07-10: qty 60, 60d supply, fill #3
  Filled 2022-07-22 – 2022-07-24 (×2): qty 60, 60d supply, fill #4
  Filled 2022-11-10: qty 60, 60d supply, fill #5
  Filled 2023-01-05: qty 60, 60d supply, fill #6

## 2022-04-12 ENCOUNTER — Other Ambulatory Visit (HOSPITAL_COMMUNITY): Payer: Self-pay

## 2022-04-18 ENCOUNTER — Encounter: Payer: Self-pay | Admitting: Oncology

## 2022-04-21 ENCOUNTER — Inpatient Hospital Stay: Payer: Medicaid Other

## 2022-04-21 DIAGNOSIS — C50411 Malignant neoplasm of upper-outer quadrant of right female breast: Secondary | ICD-10-CM | POA: Insufficient documentation

## 2022-04-21 DIAGNOSIS — Z9221 Personal history of antineoplastic chemotherapy: Secondary | ICD-10-CM | POA: Insufficient documentation

## 2022-04-21 DIAGNOSIS — Z79811 Long term (current) use of aromatase inhibitors: Secondary | ICD-10-CM | POA: Insufficient documentation

## 2022-04-21 DIAGNOSIS — Z923 Personal history of irradiation: Secondary | ICD-10-CM | POA: Insufficient documentation

## 2022-04-21 DIAGNOSIS — Z170 Estrogen receptor positive status [ER+]: Secondary | ICD-10-CM | POA: Insufficient documentation

## 2022-04-21 LAB — CMP (CANCER CENTER ONLY)
ALT: 14 U/L (ref 0–44)
AST: 17 U/L (ref 15–41)
Albumin: 3.9 g/dL (ref 3.5–5.0)
Alkaline Phosphatase: 96 U/L (ref 38–126)
Anion gap: 6 (ref 5–15)
BUN: 9 mg/dL (ref 8–23)
CO2: 25 mmol/L (ref 22–32)
Calcium: 9.6 mg/dL (ref 8.9–10.3)
Chloride: 106 mmol/L (ref 98–111)
Creatinine: 1.04 mg/dL — ABNORMAL HIGH (ref 0.44–1.00)
GFR, Estimated: >60 mL/min (ref >60)
Glucose, Bld: 80 mg/dL (ref 70–99)
Potassium: 4.7 mmol/L (ref 3.5–5.1)
Sodium: 137 mmol/L (ref 135–145)
Total Bilirubin: 0.4 mg/dL (ref 0.3–1.2)
Total Protein: 6.4 g/dL — ABNORMAL LOW (ref 6.5–8.1)

## 2022-04-21 LAB — CBC WITH DIFFERENTIAL (CANCER CENTER ONLY)
Abs Immature Granulocytes: 0.02 10*3/uL (ref 0.00–0.07)
Basophils Absolute: 0 10*3/uL (ref 0.0–0.1)
Basophils Relative: 1 %
Eosinophils Absolute: 0 10*3/uL (ref 0.0–0.5)
Eosinophils Relative: 1 %
HCT: 26.1 % — ABNORMAL LOW (ref 36.0–46.0)
Hemoglobin: 8.9 g/dL — ABNORMAL LOW (ref 12.0–15.0)
Immature Granulocytes: 1 %
Lymphocytes Relative: 26 %
Lymphs Abs: 0.7 10*3/uL (ref 0.7–4.0)
MCH: 35 pg — ABNORMAL HIGH (ref 26.0–34.0)
MCHC: 34.1 g/dL (ref 30.0–36.0)
MCV: 102.8 fL — ABNORMAL HIGH (ref 80.0–100.0)
Monocytes Absolute: 0.2 10*3/uL (ref 0.1–1.0)
Monocytes Relative: 6 %
Neutro Abs: 1.8 10*3/uL (ref 1.7–7.7)
Neutrophils Relative %: 65 %
Platelet Count: 176 10*3/uL (ref 150–400)
RBC: 2.54 MIL/uL — ABNORMAL LOW (ref 3.87–5.11)
RDW: 12.2 % (ref 11.5–15.5)
WBC Count: 2.8 10*3/uL — ABNORMAL LOW (ref 4.0–10.5)
nRBC: 0 % (ref 0.0–0.2)

## 2022-04-24 ENCOUNTER — Inpatient Hospital Stay (HOSPITAL_COMMUNITY)
Admission: EM | Admit: 2022-04-24 | Discharge: 2022-04-27 | DRG: 871 | Disposition: A | Discharge status: Home / Self Care | Payer: Medicaid Other | Source: Ambulatory Visit | Attending: Internal Medicine | Admitting: Internal Medicine

## 2022-04-24 ENCOUNTER — Other Ambulatory Visit: Payer: Self-pay

## 2022-04-24 ENCOUNTER — Emergency Department (HOSPITAL_COMMUNITY): Payer: Medicaid Other

## 2022-04-24 ENCOUNTER — Encounter (HOSPITAL_COMMUNITY): Payer: Self-pay

## 2022-04-24 ENCOUNTER — Inpatient Hospital Stay (HOSPITAL_BASED_OUTPATIENT_CLINIC_OR_DEPARTMENT_OTHER): Payer: Medicaid Other | Admitting: Hematology and Oncology

## 2022-04-24 VITALS — BP 77/47 | HR 80 | Temp 97.7°F | Resp 18 | Wt 202.3 lb

## 2022-04-24 DIAGNOSIS — R042 Hemoptysis: Secondary | ICD-10-CM | POA: Diagnosis present

## 2022-04-24 DIAGNOSIS — Z6832 Body mass index (BMI) 32.0-32.9, adult: Secondary | ICD-10-CM

## 2022-04-24 DIAGNOSIS — E871 Hypo-osmolality and hyponatremia: Secondary | ICD-10-CM | POA: Diagnosis present

## 2022-04-24 DIAGNOSIS — Z79811 Long term (current) use of aromatase inhibitors: Secondary | ICD-10-CM

## 2022-04-24 DIAGNOSIS — K920 Hematemesis: Secondary | ICD-10-CM | POA: Diagnosis present

## 2022-04-24 DIAGNOSIS — R748 Abnormal levels of other serum enzymes: Secondary | ICD-10-CM | POA: Diagnosis present

## 2022-04-24 DIAGNOSIS — Z23 Encounter for immunization: Secondary | ICD-10-CM | POA: Diagnosis not present

## 2022-04-24 DIAGNOSIS — N179 Acute kidney failure, unspecified: Secondary | ICD-10-CM | POA: Diagnosis present

## 2022-04-24 DIAGNOSIS — A419 Sepsis, unspecified organism: Principal | ICD-10-CM | POA: Diagnosis present

## 2022-04-24 DIAGNOSIS — Z1152 Encounter for screening for COVID-19: Secondary | ICD-10-CM

## 2022-04-24 DIAGNOSIS — Z8261 Family history of arthritis: Secondary | ICD-10-CM

## 2022-04-24 DIAGNOSIS — Z9221 Personal history of antineoplastic chemotherapy: Secondary | ICD-10-CM | POA: Diagnosis not present

## 2022-04-24 DIAGNOSIS — Z66 Do not resuscitate: Secondary | ICD-10-CM | POA: Diagnosis present

## 2022-04-24 DIAGNOSIS — J189 Pneumonia, unspecified organism: Secondary | ICD-10-CM | POA: Diagnosis present

## 2022-04-24 DIAGNOSIS — I10 Essential (primary) hypertension: Secondary | ICD-10-CM | POA: Diagnosis present

## 2022-04-24 DIAGNOSIS — E785 Hyperlipidemia, unspecified: Secondary | ICD-10-CM | POA: Diagnosis present

## 2022-04-24 DIAGNOSIS — R051 Acute cough: Secondary | ICD-10-CM | POA: Diagnosis not present

## 2022-04-24 DIAGNOSIS — E119 Type 2 diabetes mellitus without complications: Secondary | ICD-10-CM | POA: Diagnosis present

## 2022-04-24 DIAGNOSIS — R5381 Other malaise: Secondary | ICD-10-CM | POA: Diagnosis present

## 2022-04-24 DIAGNOSIS — D84821 Immunodeficiency due to drugs: Secondary | ICD-10-CM | POA: Diagnosis present

## 2022-04-24 DIAGNOSIS — R1310 Dysphagia, unspecified: Secondary | ICD-10-CM | POA: Diagnosis present

## 2022-04-24 DIAGNOSIS — Z882 Allergy status to sulfonamides status: Secondary | ICD-10-CM

## 2022-04-24 DIAGNOSIS — Z7984 Long term (current) use of oral hypoglycemic drugs: Secondary | ICD-10-CM

## 2022-04-24 DIAGNOSIS — E861 Hypovolemia: Secondary | ICD-10-CM | POA: Diagnosis present

## 2022-04-24 DIAGNOSIS — Z170 Estrogen receptor positive status [ER+]: Secondary | ICD-10-CM

## 2022-04-24 DIAGNOSIS — C50911 Malignant neoplasm of unspecified site of right female breast: Secondary | ICD-10-CM | POA: Diagnosis present

## 2022-04-24 DIAGNOSIS — Z825 Family history of asthma and other chronic lower respiratory diseases: Secondary | ICD-10-CM

## 2022-04-24 DIAGNOSIS — Z79899 Other long term (current) drug therapy: Secondary | ICD-10-CM

## 2022-04-24 DIAGNOSIS — E669 Obesity, unspecified: Secondary | ICD-10-CM | POA: Diagnosis present

## 2022-04-24 DIAGNOSIS — C50411 Malignant neoplasm of upper-outer quadrant of right female breast: Secondary | ICD-10-CM | POA: Diagnosis present

## 2022-04-24 DIAGNOSIS — T451X5A Adverse effect of antineoplastic and immunosuppressive drugs, initial encounter: Secondary | ICD-10-CM | POA: Diagnosis present

## 2022-04-24 DIAGNOSIS — Z923 Personal history of irradiation: Secondary | ICD-10-CM

## 2022-04-24 DIAGNOSIS — E860 Dehydration: Secondary | ICD-10-CM | POA: Diagnosis present

## 2022-04-24 DIAGNOSIS — R6520 Severe sepsis without septic shock: Secondary | ICD-10-CM | POA: Diagnosis present

## 2022-04-24 DIAGNOSIS — D61818 Other pancytopenia: Secondary | ICD-10-CM | POA: Diagnosis present

## 2022-04-24 DIAGNOSIS — Z96652 Presence of left artificial knee joint: Secondary | ICD-10-CM | POA: Diagnosis present

## 2022-04-24 LAB — TROPONIN I (HIGH SENSITIVITY)
Troponin I (High Sensitivity): 13 ng/L (ref <18)
Troponin I (High Sensitivity): 10 ng/L (ref <18)

## 2022-04-24 LAB — RESP PANEL BY RT-PCR (RSV, FLU A&B, COVID)  RVPGX2
Influenza A by PCR: NEGATIVE
Influenza B by PCR: NEGATIVE
Resp Syncytial Virus by PCR: NEGATIVE
SARS Coronavirus 2 by RT PCR: NEGATIVE

## 2022-04-24 LAB — CBC WITH DIFFERENTIAL/PLATELET
Abs Immature Granulocytes: 0 10*3/uL (ref 0.00–0.07)
Abs Immature Granulocytes: 0.01 10*3/uL (ref 0.00–0.07)
Basophils Absolute: 0 10*3/uL (ref 0.0–0.1)
Basophils Absolute: 0 10*3/uL (ref 0.0–0.1)
Basophils Relative: 1 %
Basophils Relative: 0 %
Eosinophils Absolute: 0 10*3/uL (ref 0.0–0.5)
Eosinophils Absolute: 0 10*3/uL (ref 0.0–0.5)
Eosinophils Relative: 0 %
Eosinophils Relative: 0 %
HCT: 23.4 % — ABNORMAL LOW (ref 36.0–46.0)
HCT: 20.8 % — ABNORMAL LOW (ref 36.0–46.0)
Hemoglobin: 7.8 g/dL — ABNORMAL LOW (ref 12.0–15.0)
Hemoglobin: 6.9 g/dL — CL (ref 12.0–15.0)
Immature Granulocytes: 0 %
Immature Granulocytes: 0 %
Lymphocytes Relative: 16 %
Lymphocytes Relative: 18 %
Lymphs Abs: 0.4 10*3/uL — ABNORMAL LOW (ref 0.7–4.0)
Lymphs Abs: 0.4 10*3/uL — ABNORMAL LOW (ref 0.7–4.0)
MCH: 35.1 pg — ABNORMAL HIGH (ref 26.0–34.0)
MCH: 34.5 pg — ABNORMAL HIGH (ref 26.0–34.0)
MCHC: 33.3 g/dL (ref 30.0–36.0)
MCHC: 33.2 g/dL (ref 30.0–36.0)
MCV: 105.4 fL — ABNORMAL HIGH (ref 80.0–100.0)
MCV: 104 fL — ABNORMAL HIGH (ref 80.0–100.0)
Monocytes Absolute: 0.2 10*3/uL (ref 0.1–1.0)
Monocytes Absolute: 0.1 10*3/uL (ref 0.1–1.0)
Monocytes Relative: 7 %
Monocytes Relative: 5 %
Neutro Abs: 1.9 10*3/uL (ref 1.7–7.7)
Neutro Abs: 1.7 10*3/uL (ref 1.7–7.7)
Neutrophils Relative %: 76 %
Neutrophils Relative %: 77 %
Platelets: 153 10*3/uL (ref 150–400)
Platelets: 150 10*3/uL (ref 150–400)
RBC: 2.22 MIL/uL — ABNORMAL LOW (ref 3.87–5.11)
RBC: 2 MIL/uL — ABNORMAL LOW (ref 3.87–5.11)
RDW: 12.7 % (ref 11.5–15.5)
RDW: 12.6 % (ref 11.5–15.5)
WBC: 2.5 10*3/uL — ABNORMAL LOW (ref 4.0–10.5)
WBC: 2.2 10*3/uL — ABNORMAL LOW (ref 4.0–10.5)
nRBC: 0 % (ref 0.0–0.2)
nRBC: 0 % (ref 0.0–0.2)

## 2022-04-24 LAB — COMPREHENSIVE METABOLIC PANEL
ALT: 244 U/L — ABNORMAL HIGH (ref 0–44)
ALT: 189 U/L — ABNORMAL HIGH (ref 0–44)
AST: 268 U/L — ABNORMAL HIGH (ref 15–41)
AST: 169 U/L — ABNORMAL HIGH (ref 15–41)
Albumin: 3.2 g/dL — ABNORMAL LOW (ref 3.5–5.0)
Albumin: 2.9 g/dL — ABNORMAL LOW (ref 3.5–5.0)
Alkaline Phosphatase: 161 U/L — ABNORMAL HIGH (ref 38–126)
Alkaline Phosphatase: 143 U/L — ABNORMAL HIGH (ref 38–126)
Anion gap: 10 (ref 5–15)
Anion gap: 9 (ref 5–15)
BUN: 17 mg/dL (ref 8–23)
BUN: 21 mg/dL (ref 8–23)
CO2: 20 mmol/L — ABNORMAL LOW (ref 22–32)
CO2: 19 mmol/L — ABNORMAL LOW (ref 22–32)
Calcium: 8.6 mg/dL — ABNORMAL LOW (ref 8.9–10.3)
Calcium: 8.3 mg/dL — ABNORMAL LOW (ref 8.9–10.3)
Chloride: 98 mmol/L (ref 98–111)
Chloride: 103 mmol/L (ref 98–111)
Creatinine, Ser: 1.98 mg/dL — ABNORMAL HIGH (ref 0.44–1.00)
Creatinine, Ser: 2.02 mg/dL — ABNORMAL HIGH (ref 0.44–1.00)
GFR, Estimated: 28 mL/min — ABNORMAL LOW (ref >60)
GFR, Estimated: 27 mL/min — ABNORMAL LOW (ref >60)
Glucose, Bld: 129 mg/dL — ABNORMAL HIGH (ref 70–99)
Glucose, Bld: 114 mg/dL — ABNORMAL HIGH (ref 70–99)
Potassium: 4.4 mmol/L (ref 3.5–5.1)
Potassium: 4.6 mmol/L (ref 3.5–5.1)
Sodium: 128 mmol/L — ABNORMAL LOW (ref 135–145)
Sodium: 131 mmol/L — ABNORMAL LOW (ref 135–145)
Total Bilirubin: 0.7 mg/dL (ref 0.3–1.2)
Total Bilirubin: 0.5 mg/dL (ref 0.3–1.2)
Total Protein: 6.4 g/dL — ABNORMAL LOW (ref 6.5–8.1)
Total Protein: 5.7 g/dL — ABNORMAL LOW (ref 6.5–8.1)

## 2022-04-24 LAB — MAGNESIUM: Magnesium: 1.1 mg/dL — ABNORMAL LOW (ref 1.7–2.4)

## 2022-04-24 LAB — APTT: aPTT: 32 seconds (ref 24–36)

## 2022-04-24 LAB — CBG MONITORING, ED: Glucose-Capillary: 114 mg/dL — ABNORMAL HIGH (ref 70–99)

## 2022-04-24 LAB — D-DIMER, QUANTITATIVE: D-Dimer, Quant: 1.03 ug/mL-FEU — ABNORMAL HIGH (ref 0.00–0.50)

## 2022-04-24 LAB — PROTIME-INR
INR: 1.4 — ABNORMAL HIGH (ref 0.8–1.2)
Prothrombin Time: 17.1 seconds — ABNORMAL HIGH (ref 11.4–15.2)

## 2022-04-24 LAB — PHOSPHORUS: Phosphorus: 2.3 mg/dL — ABNORMAL LOW (ref 2.5–4.6)

## 2022-04-24 LAB — LACTIC ACID, PLASMA: Lactic Acid, Venous: 2.8 mmol/L (ref 0.5–1.9)

## 2022-04-24 MED ORDER — SODIUM CHLORIDE 0.9 % IV BOLUS (SEPSIS)
1000.0000 mL | Freq: Once | INTRAVENOUS | Status: AC
  Administered 2022-04-24: 1000 mL via INTRAVENOUS

## 2022-04-24 MED ORDER — PANTOPRAZOLE SODIUM 40 MG PO TBEC
40.0000 mg | DELAYED_RELEASE_TABLET | Freq: Every day | ORAL | Status: DC
  Administered 2022-04-25 – 2022-04-27 (×3): 40 mg via ORAL
  Filled 2022-04-24 (×3): qty 1

## 2022-04-24 MED ORDER — IPRATROPIUM-ALBUTEROL 0.5-2.5 (3) MG/3ML IN SOLN
RESPIRATORY_TRACT | Status: AC
  Administered 2022-04-24: 3 mL via RESPIRATORY_TRACT
  Filled 2022-04-24: qty 3

## 2022-04-24 MED ORDER — ANASTROZOLE 1 MG PO TABS
1.0000 mg | ORAL_TABLET | Freq: Every day | ORAL | Status: DC
  Administered 2022-04-25 – 2022-04-27 (×3): 1 mg via ORAL
  Filled 2022-04-24 (×3): qty 1

## 2022-04-24 MED ORDER — PROCHLORPERAZINE EDISYLATE 10 MG/2ML IJ SOLN: 5.0000 mg | Freq: Four times a day (QID) | INTRAMUSCULAR | Status: DC | PRN

## 2022-04-24 MED ORDER — IPRATROPIUM-ALBUTEROL 0.5-2.5 (3) MG/3ML IN SOLN
3.0000 mL | Freq: Four times a day (QID) | RESPIRATORY_TRACT | Status: DC
  Administered 2022-04-25 (×4): 3 mL via RESPIRATORY_TRACT
  Filled 2022-04-24 (×3): qty 3

## 2022-04-24 MED ORDER — SODIUM CHLORIDE 0.9 % IV SOLN
500.0000 mg | INTRAVENOUS | Status: DC
  Administered 2022-04-24 – 2022-04-26 (×3): 500 mg via INTRAVENOUS
  Filled 2022-04-24 (×4): qty 5

## 2022-04-24 MED ORDER — LACTATED RINGERS IV SOLN: INTRAVENOUS | Status: DC

## 2022-04-24 MED ORDER — SODIUM CHLORIDE 0.9 % IV SOLN
2.0000 g | INTRAVENOUS | Status: DC
  Administered 2022-04-24 – 2022-04-26 (×3): 2 g via INTRAVENOUS
  Filled 2022-04-24 (×3): qty 20

## 2022-04-24 MED ORDER — INSULIN ASPART 100 UNIT/ML IJ SOLN
0.0000 [IU] | Freq: Every day | INTRAMUSCULAR | Status: DC
  Filled 2022-04-24: qty 0.05

## 2022-04-24 MED ORDER — ENOXAPARIN SODIUM 40 MG/0.4ML IJ SOSY: 40.0000 mg | PREFILLED_SYRINGE | INTRAMUSCULAR | Status: DC

## 2022-04-24 MED ORDER — SODIUM CHLORIDE 0.9 % IV SOLN: INTRAVENOUS | Status: DC

## 2022-04-24 MED ORDER — SODIUM CHLORIDE 0.9 % IV BOLUS (SEPSIS)
1000.0000 mL | Freq: Once | INTRAVENOUS | Status: AC
  Administered 2022-04-25: 1000 mL via INTRAVENOUS

## 2022-04-24 MED ORDER — POLYETHYLENE GLYCOL 3350 17 G PO PACK: 17.0000 g | PACK | Freq: Every day | ORAL | Status: DC | PRN

## 2022-04-24 MED ORDER — ACETAMINOPHEN 325 MG PO TABS
650.0000 mg | ORAL_TABLET | Freq: Four times a day (QID) | ORAL | Status: DC | PRN
  Administered 2022-04-25: 650 mg via ORAL
  Filled 2022-04-24 (×2): qty 2

## 2022-04-24 MED ORDER — OYSTER SHELL CALCIUM/D3 500-5 MG-MCG PO TABS
1.0000 | ORAL_TABLET | Freq: Every day | ORAL | Status: DC
  Administered 2022-04-25 – 2022-04-27 (×3): 1 via ORAL
  Filled 2022-04-24 (×3): qty 1

## 2022-04-24 MED ORDER — ATORVASTATIN CALCIUM 40 MG PO TABS
40.0000 mg | ORAL_TABLET | Freq: Every day | ORAL | Status: DC
  Administered 2022-04-25: 40 mg via ORAL
  Filled 2022-04-24: qty 1

## 2022-04-24 MED ORDER — INSULIN ASPART 100 UNIT/ML IJ SOLN
0.0000 [IU] | Freq: Three times a day (TID) | INTRAMUSCULAR | Status: DC
  Filled 2022-04-24: qty 0.09

## 2022-04-24 MED ORDER — ENOXAPARIN SODIUM 100 MG/ML IJ SOSY
90.0000 mg | PREFILLED_SYRINGE | Freq: Once | INTRAMUSCULAR | Status: AC
  Administered 2022-04-24: 90 mg via SUBCUTANEOUS
  Filled 2022-04-24: qty 0.9

## 2022-04-24 MED ORDER — GUAIFENESIN 100 MG/5ML PO LIQD
5.0000 mL | ORAL | Status: DC | PRN
  Administered 2022-04-25 – 2022-04-26 (×3): 5 mL via ORAL
  Filled 2022-04-24 (×3): qty 10

## 2022-04-24 NOTE — Sepsis Progress Note (Signed)
Notified bedside nurse of need to draw repeat lactic acid. 

## 2022-04-24 NOTE — H&P (Addendum)
History and Physical  [name] [name] AST:[id number] DOB: 1955-06-07 DOA: 04/24/2022  Referring physician: Dr. Tomi [name], [place]. PCP: [name] Hughs, NP  Outpatient Specialists: Oncology. Patient coming from: Home.  Chief Complaint: Hypotension.  HPI: [name] [name] is a 66 y.o. female with medical history significant for breast cancer, type 2 diabetes, hyperlipidemia, GERD, who presented to [facility] ED at the recommendation of her oncologist due to hypotension.  She had a follow-up visit with oncology on the morning of her presentation.  While in the ER patient was noted to be hypotensive.  She was advised to go to the ED for further evaluation.  In the ED, endorses that she took her blood pressure medications in the morning.  Systolic blood pressures in the 70s, with MAP in the 50s.  She received IV fluid boluses with improvement of her hypotension.    Upon further investigation she reported a persistent intermittent cough for months and progressively worsening in the past few days.  Associated with congestion and generalized weakness.  Workup revealed right lower lobe infiltrates suggestive of pneumonia, seen on chest x-ray.  Started on empiric IV antibiotics Rocephin and azithromycin.  Admitted by [facility], hospitalist service.  ED Course: Tmax 99.2.  BP 94/65, pulse 94, respiration rate 12, O2 saturation 99% on room air.  Lab studies remarkable for serum sodium 131, bicarb 19, creatinine 2.02, magnesium 1.1, phosphorus 2.3, alkaline phosphatase 143, albumin 2.9, AST 169, ALT 189.  Review of Systems: Review of systems as noted in the HPI. All other systems reviewed and are negative.   Past Medical History:  Diagnosis Date   Arthritis    Breast cancer ([place])    Cancer ([place])    breast   Diabetes mellitus without complication ([place])    History of MRI    Knee, Hip, and Back.   Hypertension    Personal history of chemotherapy    Personal history of radiation therapy    Past Surgical History:  Procedure  Laterality Date   BREAST BIOPSY Right 02/03/2020   x2   BREAST BIOPSY Right 02/06/2020   x2   BREAST LUMPECTOMY Right 07/26/2020   BREAST LUMPECTOMY WITH RADIOACTIVE SEED AND SENTINEL LYMPH NODE BIOPSY Right 07/26/2020   Procedure: RIGHT BREAST LUMPECTOMY WITH RADIOACTIVE SEED X 2 AND SENTINEL LYMPH NODE BIOPSY;  Surgeon: Erroll Luna, MD;  Location: [facility];  Service: General;  Laterality: Right;   IR IMAGING GUIDED PORT INSERTION  02/23/2020   PORT-A-CATH REMOVAL N/A 07/26/2020   Procedure: REMOVAL PORT-A-CATH;  Surgeon: Erroll Luna, MD;  Location: [place];  Service: General;  Laterality: N/A;   TOTAL KNEE ARTHROPLASTY Left 11/18/2021   Procedure: LEFT TOTAL KNEE ARTHROPLASTY;  Surgeon: Leandrew Koyanagi, MD;  Location: [place];  Service: Orthopedics;  Laterality: Left;    Social History:  reports that she has never smoked. She has never used smokeless tobacco. She reports current alcohol use of about 1.0 standard drink of alcohol per week. She reports that she does not use drugs.   Allergies  Allergen Reactions   Sulfa Antibiotics Itching    Family History  Problem Relation Age of Onset   Asthma Mother    Arthritis Sister    Asthma Brother    Arthritis Brother       Prior to Admission medications   Medication Sig Start Date End Date Taking? Authorizing Provider  anastrozole (ARIMIDEX) 1 MG tablet Take 1 tablet (1 mg total) by mouth daily. 04/11/22   [name] Pike,  MD  atorvastatin (LIPITOR) 40 MG tablet Take 1 tablet (40 mg total) by mouth daily. 02/03/22   Ngetich, Dinah C, NP  calcium-vitamin D (OSCAL WITH D) 500-5 MG-MCG tablet Take 1 tablet by mouth daily. Will bring in dosing to next visit    [provider]  carvedilol (COREG) 6.25 MG tablet Take 1 tablet (6.25 mg total) by mouth 2 (two) times daily with a meal. 02/03/22   Ngetich, Dinah C, NP  diclofenac (VOLTAREN) 75 MG EC tablet Take 1 tablet (75 mg total) by mouth 2 (two)  times daily. 02/03/22   Ngetich, Dinah C, NP  lansoprazole (PREVACID) 30 MG capsule Take 1 capsule (30 mg total) by mouth every morning. 03/24/22   Ngetich, Dinah C, NP  Magnesium 100 MG CAPS Take 100 mg by mouth 2 (two) times daily.    [provider]  metFORMIN (GLUCOPHAGE) 1000 MG tablet Take 1 tablet (1,000 mg total) by mouth 2 (two) times daily with a meal. 03/24/22   Ngetich, Dinah C, NP  Multiple Vitamins-Minerals (CENTRUM SILVER 50+WOMEN PO) Take 1 capsule by mouth daily.    [provider]  UNABLE TO FIND Med Name: [place]. Pt reports taking 1 scoopful in tea or water    [provider]  valsartan (DIOVAN) 320 MG tablet Take 1 tablet (320 mg total) by mouth daily. 01/08/22   Ngetich, Dinah C, NP  VERZENIO 100 MG tablet TAKE 1 TABLET BY MOUTH TWICE DAILY 03/25/22   [name] Pike, MD  vitamin C (ASCORBIC ACID) 500 MG tablet Take 500 mg by mouth daily.    [provider]    Physical Exam: BP 120/61   Pulse 87   Temp 99.2 F (37.3 C) (Oral)   Resp 18   Ht '5\' 6"'$  (1.676 m)   Wt 91.6 kg   SpO2 100%   BMI 32.60 kg/m   General: 66 y.o. year-old female well developed well nourished in no acute distress.  Alert and oriented x3. Cardiovascular: Regular rate and rhythm with no rubs or gallops.  No thyromegaly or JVD noted.  No lower extremity edema. 2/4 pulses in all 4 extremities. Respiratory: Mild rales at bases.  Poor inspiratory effort.  No wheezing noted. Abdomen: Soft nontender nondistended with normal bowel sounds x4 quadrants. Muskuloskeletal: No cyanosis, clubbing or edema noted bilaterally Neuro: CN II-XII intact, strength, sensation, reflexes Skin: No ulcerative lesions noted or rashes Psychiatry: Judgement and insight appear normal. Mood is appropriate for condition and setting          Labs on Admission:  Basic Metabolic Panel: Recent Labs  Lab 04/21/22 0755 04/24/22 1752  NA 137 128*  K 4.7 4.4  CL 106 98  CO2 25 20*  GLUCOSE  80 129*  BUN 9 17  CREATININE 1.04* 1.98*  CALCIUM 9.6 8.6*   Liver Function Tests: Recent Labs  Lab 04/21/22 0755 04/24/22 1752  AST 17 268*  ALT 14 244*  ALKPHOS 96 161*  BILITOT 0.4 0.7  PROT 6.4* 6.4*  ALBUMIN 3.9 3.2*   No results for input(s): "LIPASE", "AMYLASE" in the last 168 hours. No results for input(s): "AMMONIA" in the last 168 hours. CBC: Recent Labs  Lab 04/21/22 0755 04/24/22 1752  WBC 2.8* 2.5*  NEUTROABS 1.8 1.9  HGB 8.9* 7.8*  HCT 26.1* 23.4*  MCV 102.8* 105.4*  PLT 176 153   Cardiac Enzymes: No results for input(s): "CKTOTAL", "CKMB", "CKMBINDEX", "TROPONINI" in the last 168 hours.  BNP (last 3 results)  No results for input(s): "BNP" in the last 8760 hours.  ProBNP (last 3 results) No results for input(s): "PROBNP" in the last 8760 hours.  CBG: No results for input(s): "GLUCAP" in the last 168 hours.  Radiological Exams on Admission: DG Chest Port 1 View  Result Date: 04/24/2022 CLINICAL DATA:  Questionable sepsis - evaluate for abnormality EXAM: PORTABLE CHEST 1 VIEW COMPARISON:  CT chest 04/23/2020 FINDINGS: Prominent cardiac silhouette. Finding may be due to AP portable technique. Otherwise the heart and mediastinal contours are within normal limits. Right lower lobe patchy airspace and interstitial opacities. Question similar findings to a lesser extent within the left lower lobe. No pulmonary edema. No pleural effusion. No pneumothorax. No acute osseous abnormality. Surgical clips overlie the right axilla. IMPRESSION: Right lower lobe patchy airspace and interstitial opacities. Question similar findings to a lesser extent within the left lower lobe. Findings suggestive of infection/inflammation. Followup PA and lateral chest X-ray is recommended in 3-4 weeks following therapy to ensure resolution and exclude underlying malignancy. Electronically Signed   By: Iven Finn M.D.   On: 04/24/2022 17:30    EKG: I independently viewed the EKG  done and my findings are as followed: Sinus rhythm rate of 82.  Nonspecific ST-T changes.  QTc 421.  Assessment/Plan Present on Admission:  Sepsis ([place])  Principal Problem:   Sepsis ([place])  Sepsis secondary to right lower lobe pneumonia, POA WBC 2.2, respiration rate 26, heart rate 111, right lower lobe infiltrate suggestive of pneumonia.  Started on Rocephin and azithromycin in the ED, continue. Monitor fever curve and WBC. Follow cultures including sputum culture. Not hypoxic.  AKI, suspect prerenal in the setting of dehydration Baseline creatinine 1.0 with GFR greater than 60 Presented with creatinine of 2.02 with GFR of 27. Avoid nephrotoxic agents, dehydration and hypotension. Continue IV fluid hydration Monitor urine output with strict I's and O's  Repeat chemistry panel.  Elevated liver chemistries, unclear etiology Rule out side effect from medications LFTs are downtrending.  Anemia of chronic disease Acute drop in hemoglobin this morning 6.9 from 7.8. No overt bleeding reported 2 units PRBCs ordered to be transfused.  Hypovolemic hyponatremia Serum sodium 131 Received IV fluid NS in the ED Repeat chemistry panel  Hypophosphatemia Hypomagnesemia Serum magnesium 1.1 Phosphorus 2.3 Both repleted intravenously  Non anion gap metabolic acidosis Lactic acidosis Serum bicarb 19, anion gap 9, lactic acid 2.8 IV fluid hydration Lactic acid improving with IV fluid  Breast cancer On anastrozole Follows with oncology outpatient.   DVT prophylaxis: SCDs  Code Status: Full code  Family Communication: Daughter at bedside.  Disposition Plan: Admitted to progressive care unit  Consults called: None.  Admission status: Inpatient status.   Status is: Inpatient The patient requires at least 2 midnights for further evaluation and treatment of present condition.   Kayleen Memos MD [facility] Pager [phone]  If 7PM-7AM, please contact  night-coverage [email or web address] Password [facility]  04/24/2022, 10:33 PM

## 2022-04-24 NOTE — Progress Notes (Signed)
[place]  [phone]) [phone] Fax:(336) [phone]     ID: [name] [name] DOB: [birth year]  MR#: [id number]  CSN#:[id number]  Patient Care Team: Sandrea Hughs, NP as PCP - General (Family Medicine) Mauro Kaufmann, RN as Oncology Nurse Navigator Rockwell Germany, RN as Oncology Nurse Navigator Erroll Luna, MD as Consulting Physician (General Surgery) Eppie Gibson, MD as Attending Physician (Radiation Oncology) Benay Pike, MD as Medical Oncologist (Hematology and Oncology) Raina Mina, RPH-CPP as Pharmacist (Hematology and Oncology) Benay Pike, MD  OTHER MD:  CHIEF COMPLAINT: Estrogen receptor positive breast cancer  CURRENT TREATMENT: Anastrozole with abemaciclib.  INTERVAL HISTORY:  [name] [name] returns today for follow up of her estrogen receptor positive breast cancer.  She today arrived by herself to the appointment.  She was not feeling well at the time of my visit.  She reported cough, sore throat, feeling dizzy.  At the time of her visit, her blood pressure was very low.  She was barely able to get up from the chair to the examination table.  Overall she felt very very poorly.  Rest of the pertinent 10 point ROS reviewed and negative.   COVID 19 VACCINATION STATUS: s/p  [employer]  X2, most recently June 2021, no booster as of 05/01/2020   HISTORY OF CURRENT ILLNESS: From the original intake note:  [name] [name] herself palpated a mass in the right axilla "a long time ago", more recently with complaints of associated pain. She brought it to medical attention and underwent bilateral diagnostic mammography with tomography and bilateral breast ultrasonography at [facility] on 01/31/2020 showing: breast density category B; three right breast masses-- 1.9 cm at 12 o'clock, 2.3 cm at 11 o'clock, 0.4 cm at 1 o'clock; two adjacent enlarged thickened lymph nodes measuring up to 2.1 cm; no evidence of left breast malignancy.  Accordingly on  02/03/2020 she proceeded to biopsy of the right breast area at 12 o'clock and right axilla. The pathology from this procedure ([id number]) showed: invasive ductal carcinoma, grade 3. Prognostic indicators significant for: estrogen receptor, 100% positive with strong staining intensity and progesterone receptor, 0% negative. Proliferation marker Ki67 at 60%. HER2 equivocal by immunohistochemistry (2+), but negative by fluorescent in situ hybridization with a signals ratio 0.81 and number per cell 2.7.  The biopsied lymph node was found to show metastatic carcinoma. The morphology was considered different from the biopsied mass, and a prognostic panel was performed. Estrogen receptor, 100% positive with strong staining intensity and progesterone receptor, 0% negative. Proliferation marker Ki67 at 70%. HER2 equivcoal by immunohistochemistry (2+), but negative) by fluorescent in situ hybridization with a signals ratio 1.16 and number per cell 3.9.  She underwent additional right breast biopsies on 02/06/2020. Pathology [id number]) from the mass at 11 o'clock showed: invasive ductal carcinoma, grade 2. Prognostic indicators significant for: estrogen receptor, 100% positive with strong staining intensity and progesterone receptor, 0% negative. Proliferation marker Ki67 at 15%. HER2 equivocal by immunohistochemistry (2+), but negative by fluorescent in situ hybridization with a signals ratio 1.29 and number per cell 2.7.  The biopsied mass at 1 o'clock showed only fibrocystic change.  The patient's subsequent history is as detailed below.   PAST MEDICAL HISTORY: Past Medical History:  Diagnosis Date   Arthritis    Breast cancer ([place])    Cancer ([facility])    breast   Diabetes mellitus without complication ([place])    History of MRI    Knee, Hip, and Back.   Hypertension  Personal history of chemotherapy    Personal history of radiation therapy     PAST SURGICAL HISTORY: Past Surgical History:  Procedure  Laterality Date   BREAST BIOPSY Right 02/03/2020   x2   BREAST BIOPSY Right 02/06/2020   x2   BREAST LUMPECTOMY Right 07/26/2020   BREAST LUMPECTOMY WITH RADIOACTIVE SEED AND SENTINEL LYMPH NODE BIOPSY Right 07/26/2020   Procedure: RIGHT BREAST LUMPECTOMY WITH RADIOACTIVE SEED X 2 AND SENTINEL LYMPH NODE BIOPSY;  Surgeon: Erroll Luna, MD;  Location: [facility];  Service: General;  Laterality: Right;   IR IMAGING GUIDED PORT INSERTION  02/23/2020   PORT-A-CATH REMOVAL N/A 07/26/2020   Procedure: REMOVAL PORT-A-CATH;  Surgeon: Erroll Luna, MD;  Location: [facility];  Service: General;  Laterality: N/A;   TOTAL KNEE ARTHROPLASTY Left 11/18/2021   Procedure: LEFT TOTAL KNEE ARTHROPLASTY;  Surgeon: Leandrew Koyanagi, MD;  Location: [facility];  Service: Orthopedics;  Laterality: Left;    FAMILY HISTORY: Family History  Problem Relation Age of Onset   Asthma Mother    Arthritis Sister    Asthma Brother    Arthritis Brother   [name] died in his 53s from causes unknown to the patient.  [name] died from heart disease at age 38.  The patient had 4 brothers, 1 sister, with no history of cancer in the family to her knowledge   GYNECOLOGIC HISTORY:  No LMP recorded. Patient is postmenopausal. Menarche: 66 years old Age at first live birth: 66 years old [place] P 1 LMP 22s Contraceptive HRT no  Hysterectomy? no BSO? no   SOCIAL HISTORY: (updated 02/2020)  [name] [name] is originally from [place].  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  She describes herself as single.  At home she lives with her daughter [name] [name] who works for [employer], her daughters' husband [name] [name] who is a English as a second language teacher, and their children who are 73 months old and 86 years old.  The patient is [name]    ADVANCED DIRECTIVES: Not in place.  The patient tells me she would intend to name her daughter [name] [name] as her healthcare power of attorney   HEALTH  MAINTENANCE: Social History   Tobacco Use   Smoking status: Never   Smokeless tobacco: Never  Vaping Use   Vaping Use: Never used  Substance Use Topics   Alcohol use: Yes    Alcohol/week: 1.0 standard drink of alcohol    Types: 1 Glasses of wine per week    Comment: occasionally   Drug use: Never     Colonoscopy: never done  PAP: 2017 (performed in [place])  Bone density:    Allergies  Allergen Reactions   Sulfa Antibiotics Itching    Current Outpatient Medications  Medication Sig Dispense Refill   anastrozole (ARIMIDEX) 1 MG tablet Take 1 tablet (1 mg total) by mouth daily. 90 tablet 4   atorvastatin (LIPITOR) 40 MG tablet Take 1 tablet (40 mg total) by mouth daily. 90 tablet 1   calcium-vitamin D (OSCAL WITH D) 500-5 MG-MCG tablet Take 1 tablet by mouth daily. Will bring in dosing to next visit     carvedilol (COREG) 6.25 MG tablet Take 1 tablet (6.25 mg total) by mouth 2 (two) times daily with a meal. 180 tablet 1   diclofenac (VOLTAREN) 75 MG EC tablet Take 1 tablet (75 mg total) by mouth 2 (two) times daily. 60 tablet 5   lansoprazole (PREVACID) 30  MG capsule Take 1 capsule (30 mg total) by mouth every morning. 90 capsule 1   Magnesium 100 MG CAPS Take 100 mg by mouth 2 (two) times daily.     metFORMIN (GLUCOPHAGE) 1000 MG tablet Take 1 tablet (1,000 mg total) by mouth 2 (two) times daily with a meal. 180 tablet 1   Multiple Vitamins-Minerals (CENTRUM SILVER 50+WOMEN PO) Take 1 capsule by mouth daily.     UNABLE TO FIND Med Name: [place]. Pt reports taking 1 scoopful in tea or water     valsartan (DIOVAN) 320 MG tablet Take 1 tablet (320 mg total) by mouth daily. 90 tablet 1   VERZENIO 100 MG tablet TAKE 1 TABLET BY MOUTH TWICE DAILY 56 tablet 0   vitamin C (ASCORBIC ACID) 500 MG tablet Take 500 mg by mouth daily.     No current facility-administered medications for this visit.    OBJECTIVE: African-American woman in no acute distress  There were no vitals  filed for this visit.      There is no height or weight on file to calculate BMI.   Wt Readings from Last 3 Encounters:  03/20/22 201 lb 8 oz (91.4 kg)  03/04/22 205 lb (93 kg)  02/26/22 202 lb 12.8 oz (92 kg)     ECOG FS:1 - Symptomatic but completely ambulatory  Physical Exam Constitutional:      General: She is in acute distress.     Appearance: She is ill-appearing.  Cardiovascular:     Rate and Rhythm: Normal rate and regular rhythm.     Heart sounds: Normal heart sounds.  Chest:     Comments: Right breast status postradiation changes.  No new palpable masses.  Left breast normal to inspection and palpation.  No regional adenopathy. Musculoskeletal:        General: No swelling.     Cervical back: Normal range of motion and neck supple. No rigidity.  Lymphadenopathy:     Cervical: No cervical adenopathy.  Skin:    General: Skin is warm and dry.  Neurological:     General: No focal deficit present.    LAB RESULTS:  CMP     Component Value Date/Time   NA 137 04/21/2022 0755   K 4.7 04/21/2022 0755   CL 106 04/21/2022 0755   CO2 25 04/21/2022 0755   GLUCOSE 80 04/21/2022 0755   BUN 9 04/21/2022 0755   CREATININE 1.04 (H) 04/21/2022 0755   CREATININE 0.97 01/10/2022 0808   CALCIUM 9.6 04/21/2022 0755   PROT 6.4 (L) 04/21/2022 0755   ALBUMIN 3.9 04/21/2022 0755   AST 17 04/21/2022 0755   ALT 14 04/21/2022 0755   ALKPHOS 96 04/21/2022 0755   BILITOT 0.4 04/21/2022 0755   GFRNONAA >60 04/21/2022 0755    No results found for: "TOTALPROTELP", "ALBUMINELP", "A1GS", "A2GS", "BETS", "BETA2SER", "GAMS", "MSPIKE", "SPEI"  Lab Results  Component Value Date   WBC 2.8 (L) 04/21/2022   NEUTROABS 1.8 04/21/2022   HGB 8.9 (L) 04/21/2022   HCT 26.1 (L) 04/21/2022   MCV 102.8 (H) 04/21/2022   PLT 176 04/21/2022    No results found for: "LABCA2"  No components found for: "HKGOVP034"  No results for input(s): "INR" in the last 168 hours.  No results found for:  "LABCA2"  No results found for: "KBT248"  No results found for: "CAN125"  No results found for: "CAN153"  No results found for: "CA2729"  No components found for: "HGQUANT"  No results found for: "  CEA1", "CEA" / No results found for: "CEA1", "CEA"   No results found for: "AFPTUMOR"  No results found for: "CHROMOGRNA"  No results found for: "KPAFRELGTCHN", "LAMBDASER", "KAPLAMBRATIO" (kappa/lambda light chains)  No results found for: "HGBA", "HGBA2QUANT", "HGBFQUANT", "HGBSQUAN" (Hemoglobinopathy evaluation)   No results found for: "LDH"  No results found for: "IRON", "TIBC", "IRONPCTSAT" (Iron and TIBC)  No results found for: "FERRITIN"  Urinalysis No results found for: "COLORURINE", "APPEARANCEUR", "LABSPEC", "PHURINE", "GLUCOSEU", "HGBUR", "BILIRUBINUR", "KETONESUR", "PROTEINUR", "UROBILINOGEN", "NITRITE", "LEUKOCYTESUR"   STUDIES: MS DIGITAL DIAG TOMO BILAT  Result Date: 03/27/2022 CLINICAL DATA:  66 year old female with history of right breast cancer post lumpectomy 07/26/2020. EXAM: DIGITAL DIAGNOSTIC BILATERAL MAMMOGRAM WITH TOMOSYNTHESIS TECHNIQUE: Bilateral digital diagnostic mammography and breast tomosynthesis was performed. COMPARISON:  Previous exam(s). ACR Breast Density Category b: There are scattered areas of fibroglandular density. FINDINGS: No suspicious masses or calcifications are seen in either breast. Postsurgical changes are present within the central right breast as well as right axilla, with continued decreased size/resolution of previously seen postoperative seroma at the lumpectomy site. Spot compression magnification view of the right breast lumpectomy site was performed. There is no mammographic evidence of locally recurrent malignancy. IMPRESSION: Stable lumpectomy changes in the right breast. No mammographic evidence of malignancy in either breast. RECOMMENDATION: Diagnostic mammogram is suggested in 1 year. (Code:DM-B-01Y) I have discussed the  findings and recommendations with the patient. If applicable, a reminder letter will be sent to the patient regarding the next appointment. BI-RADS CATEGORY  2: Benign. Electronically Signed   By: Everlean Alstrom M.D.   On: 03/27/2022 15:53     ELIGIBLE FOR AVAILABLE RESEARCH PROTOCOL: AET  ASSESSMENT: 66 y.o. [place] woman status post right breast upper outer quadrant (12:00) and axillary lymph node biopsy 02/03/2020, both positive for a clinical T1c N1, stage IIB invasive ductal carcinoma, grade 3, estrogen receptor positive, HER-2 and progesterone receptor negative, with an MIB-1 of 60%.  (a) biopsy of a second right upper outer quadrant T2 lesion (11 o'clock) 02/06/2020 also showed invasive ductal carcinoma, but grade 2, estrogen receptor positive, progesterone receptor and HER-2 negative, with an MIB-1 of 15%  (b) biopsy of a third right breast lesion (1:00) was benign   (1) neoadjuvant chemotherapy consisting of cyclophosphamide and docetaxel every 21 days x 4 starting 03/05/2020  (a) discontinued after 2 cycles because of peripheral neuropathy  (b) cyclophosphamide/doxorubicin started 04/24/2020, repeated every 21 days x4, completed 06/25/2020  (c) echo 04/17/2020 showed EF of 55-60%  (2) right lumpectomy 07/26/2020 found a residual pT1c pN1 invasive ductal carcinoma, grade 2, with negative margins  (a) repeat prognostic indicators show the tumor to be estrogen receptor positive, progesterone receptor and HER2 negative, with an MIB-1 of 2%.  (b) a total of 6 right axillary lymph nodes removed, 2 positive  (3) adjuvant radiation completed 10/17/2020  (4) anastrozole started June 2022  (5) CT scan of the chest 04/23/2020 shows a left brachiocephalicl catheter associated clot  (a) rivaroxaban started 04/24/2020, discontinued 08/22/2020  (b) port removed 07/26/2020   PLAN:  Started abemaciclib in March 2023.  She cannot remember why she did not started sooner, she was wondering  maybe she wanted to think about it. Last MRI breast Jan 2023 showed no evidence of breast malignancy. Post surgical collection right breast near right nipple which could have been causing the drainage.  Mammogram in Nov with stable lumpectomy changes, recommendation was to repeat mammogram in 1 year. She is here for follow-up abemaciclib however reported cold  cough sore throat, feeling poorly and dizziness.  Upon review of her vitals we have noticed that she is very hypotensive.  Given her symptoms, we wondered if it could be flu or a secondary bacterial infection and hence recommended that she be sent to the emergency room. She is now being treated for pneumonia with broad-spectrum antibiotics. We will hold abema and she can RTC in 2/3 weeks when we can reassess and consider resuming. We will also have to discuss about osteoporosis management when she returns.  Total encounter time 40 minutes including discharge to ED *Total Encounter Time as defined by the Centers for Medicare and Medicaid Services includes, in addition to the face-to-face time of a patient visit (documented in the note above) non-face-to-face time: obtaining and reviewing outside history, ordering and reviewing medications, tests or procedures, care coordination (communications with other health care professionals or caregivers) and documentation in the medical record.

## 2022-04-24 NOTE — ED Triage Notes (Signed)
Pt coming from [facility] with c/o hypotension and chest pain when coughing only. Pt currently taking oral chemo. Pt currently taking blood thinners.

## 2022-04-24 NOTE — Sepsis Progress Note (Signed)
Notified bedside nurse of need to draw and administer antibiotics, lactic acid, blood cultures, and fluid bolus. IV just started.

## 2022-04-24 NOTE — Sepsis Progress Note (Addendum)
Code sepsis protocol being monitored by e[employer]. 

## 2022-04-24 NOTE — ED Provider Notes (Signed)
[facility] DEPT Provider Note   CSN: [id number] Arrival date & time: 04/24/22  1627     History  Chief Complaint  Patient presents with   Hypotension    [name] [name] is a 66 y.o. female.  HPI   Patient has history of hypertension diabetes arthritis breast cancer undergoing treatment with adjuvant abemaciclib and anastrozole.  Patient states last evening she started having trouble with an acute cough.  She was having some generalized malaise.  Today she started to feel weak.  She has pain in her chest when she coughs.  She has had mucus production that has changed in color.  She denies any fevers.  She has not had any vomiting or diarrhea.  She has not noticed any blood in her stool.  Patient has not noticed any leg swelling. And was noted to have a low blood pressure in the cancer center office.  She was sent to the ED for further  Evaluation  Patient states she did take both of her blood pressure medications this morning. Home Medications Prior to Admission medications   Medication Sig Start Date End Date Taking? Authorizing Provider  anastrozole (ARIMIDEX) 1 MG tablet Take 1 tablet (1 mg total) by mouth daily. 04/11/22   Benay Pike, MD  atorvastatin (LIPITOR) 40 MG tablet Take 1 tablet (40 mg total) by mouth daily. 02/03/22   Ngetich, Dinah C, NP  calcium-vitamin D (OSCAL WITH D) 500-5 MG-MCG tablet Take 1 tablet by mouth daily. Will bring in dosing to next visit    [provider]  carvedilol (COREG) 6.25 MG tablet Take 1 tablet (6.25 mg total) by mouth 2 (two) times daily with a meal. 02/03/22   Ngetich, Dinah C, NP  diclofenac (VOLTAREN) 75 MG EC tablet Take 1 tablet (75 mg total) by mouth 2 (two) times daily. 02/03/22   Ngetich, Dinah C, NP  lansoprazole (PREVACID) 30 MG capsule Take 1 capsule (30 mg total) by mouth every morning. 03/24/22   Ngetich, Dinah C, NP  Magnesium 100 MG CAPS Take 100 mg by mouth 2 (two) times daily.     [provider]  metFORMIN (GLUCOPHAGE) 1000 MG tablet Take 1 tablet (1,000 mg total) by mouth 2 (two) times daily with a meal. 03/24/22   Ngetich, Dinah C, NP  Multiple Vitamins-Minerals (CENTRUM SILVER 50+WOMEN PO) Take 1 capsule by mouth daily.    [provider]  UNABLE TO FIND Med Name: [place]. Pt reports taking 1 scoopful in tea or water    [provider]  valsartan (DIOVAN) 320 MG tablet Take 1 tablet (320 mg total) by mouth daily. 01/08/22   Ngetich, Dinah C, NP  VERZENIO 100 MG tablet TAKE 1 TABLET BY MOUTH TWICE DAILY 03/25/22   Benay Pike, MD  vitamin C (ASCORBIC ACID) 500 MG tablet Take 500 mg by mouth daily.    [provider]      Allergies    Sulfa antibiotics    Review of Systems   Review of Systems  Physical Exam Updated Vital Signs BP (!) 85/50 (BP Location: Left Arm)   Pulse 86   Temp 99.2 F (37.3 C) (Oral)   Resp 16   Ht 1.676 m ('5\' 6"'$ )   Wt 91.6 kg   SpO2 100%   BMI 32.60 kg/m  Physical Exam Vitals and nursing note reviewed.  Constitutional:      Appearance: She is well-developed. She is not diaphoretic.  HENT:     Head:  Normocephalic and atraumatic.     Right Ear: External ear normal.     Left Ear: External ear normal.  Eyes:     General: No scleral icterus.       Right eye: No discharge.        Left eye: No discharge.     Conjunctiva/sclera: Conjunctivae normal.  Neck:     Trachea: No tracheal deviation.  Cardiovascular:     Rate and Rhythm: Normal rate and regular rhythm.  Pulmonary:     Effort: Pulmonary effort is normal. No respiratory distress.     Breath sounds: Normal breath sounds. No stridor. No wheezing or rales.  Abdominal:     General: Bowel sounds are normal. There is no distension.     Palpations: Abdomen is soft.     Tenderness: There is no abdominal tenderness. There is no guarding or rebound.  Musculoskeletal:        General: No tenderness or deformity.     Cervical back: Neck  supple.     Right lower leg: No edema.     Left lower leg: No edema.  Skin:    General: Skin is warm and dry.     Findings: No rash.  Neurological:     General: No focal deficit present.     Mental Status: She is alert.     Cranial Nerves: No cranial nerve deficit, dysarthria or facial asymmetry.     Sensory: No sensory deficit.     Motor: No abnormal muscle tone or seizure activity.     Coordination: Coordination normal.  Psychiatric:        Mood and Affect: Mood normal.     ED Results / Procedures / Treatments   Labs (all labs ordered are listed, but only abnormal results are displayed) Labs Reviewed  LACTIC ACID, PLASMA - Abnormal; Notable for the following components:      Result Value   Lactic Acid, Venous 2.8 (*)    All other components within normal limits  COMPREHENSIVE METABOLIC PANEL - Abnormal; Notable for the following components:   Sodium 128 (*)    CO2 20 (*)    Glucose, Bld 129 (*)    Creatinine, Ser 1.98 (*)    Calcium 8.6 (*)    Total Protein 6.4 (*)    Albumin 3.2 (*)    AST 268 (*)    ALT 244 (*)    Alkaline Phosphatase 161 (*)    GFR, Estimated 28 (*)    All other components within normal limits  CBC WITH DIFFERENTIAL/PLATELET - Abnormal; Notable for the following components:   WBC 2.5 (*)    RBC 2.22 (*)    Hemoglobin 7.8 (*)    HCT 23.4 (*)    MCV 105.4 (*)    MCH 35.1 (*)    Lymphs Abs 0.4 (*)    All other components within normal limits  PROTIME-INR - Abnormal; Notable for the following components:   Prothrombin Time 17.1 (*)    INR 1.4 (*)    All other components within normal limits  D-DIMER, QUANTITATIVE - Abnormal; Notable for the following components:   D-Dimer, Quant 1.03 (*)    All other components within normal limits  RESP PANEL BY RT-PCR (RSV, FLU A&B, COVID)  [id number]  CULTURE, BLOOD (ROUTINE X 2)  CULTURE, BLOOD (ROUTINE X 2)  APTT  LACTIC ACID, PLASMA  URINALYSIS, ROUTINE W REFLEX MICROSCOPIC  SAMPLE TO BLOOD BANK   TROPONIN I (HIGH SENSITIVITY)  TROPONIN  I (HIGH SENSITIVITY)    EKG EKG Interpretation  Date/Time:  Thursday April 24 2022 16:40:48 EST Ventricular Rate:  82 PR Interval:  164 QRS Duration: 111 QT Interval:  360 QTC Calculation: 421 R Axis:   -42 Text Interpretation: Sinus rhythm Abnormal R-wave progression, early transition Left ventricular hypertrophy No significant change since last tracing Confirmed by Dorie Rank [phone]) on 04/24/2022 5:16:03 PM  Radiology DG Chest Port 1 View  Result Date: 04/24/2022 CLINICAL DATA:  Questionable sepsis - evaluate for abnormality EXAM: PORTABLE CHEST 1 VIEW COMPARISON:  CT chest 04/23/2020 FINDINGS: Prominent cardiac silhouette. Finding may be due to AP portable technique. Otherwise the heart and mediastinal contours are within normal limits. Right lower lobe patchy airspace and interstitial opacities. Question similar findings to a lesser extent within the left lower lobe. No pulmonary edema. No pleural effusion. No pneumothorax. No acute osseous abnormality. Surgical clips overlie the right axilla. IMPRESSION: Right lower lobe patchy airspace and interstitial opacities. Question similar findings to a lesser extent within the left lower lobe. Findings suggestive of infection/inflammation. Followup PA and lateral chest X-ray is recommended in 3-4 weeks following therapy to ensure resolution and exclude underlying malignancy. Electronically Signed   By: Iven Finn M.D.   On: 04/24/2022 17:30    Procedures .Critical Care  Performed by: Dorie Rank, MD Authorized by: Dorie Rank, MD   Critical care provider statement:    Critical care time (minutes):  45   Critical care was time spent personally by me on the following activities:  Development of treatment plan with patient or surrogate, discussions with consultants, evaluation of patient's response to treatment, examination of patient, ordering and review of laboratory studies, ordering and  review of radiographic studies, ordering and performing treatments and interventions, pulse oximetry, re-evaluation of patient's condition and review of old charts     Medications Ordered in ED Medications  cefTRIAXone (ROCEPHIN) 2 g in sodium chloride 0.9 % 100 mL IVPB (0 g Intravenous Stopped 04/24/22 1832)  azithromycin (ZITHROMAX) 500 mg in sodium chloride 0.9 % 250 mL IVPB (0 mg Intravenous Stopped 04/24/22 1945)  sodium chloride 0.9 % bolus 1,000 mL (has no administration in time range)    And  sodium chloride 0.9 % bolus 1,000 mL (has no administration in time range)    And  sodium chloride 0.9 % bolus 1,000 mL (1,000 mLs Intravenous New Bag/Given 04/24/22 1948)  0.9 %  sodium chloride infusion (has no administration in time range)  enoxaparin (LOVENOX) injection 90 mg (has no administration in time range)    ED Course/ Medical Decision Making/ A&P Clinical Course as of 04/24/22 2122  Thu Apr 24, 2022  1927 CBC with Differential(!) Hemoglobin slightly decreased since last tracing [JK]  1927 Resp panel by RT-PCR (RSV, Flu A&B, Covid) Anterior Nasal Swab Covid flu negative [JK]  1948 Called lab to check on lab delay, metabolic, lactic and trop pending.  MAchine went down and samples had to be switched to new machine.  Labs are in process.  Pt feeling somewhat better.  BP still low but upward trending  [JK]  1953 Chest x-ray suggest acute pneumonia [JK]  2046 Comprehensive metabolic panel(!) Labs show elevated liver function panel, and increased at 1.98 [JK]  2053 Blood pressure improving.  Now up to the 90s.  Discussed with nursing.  Patient has not received her fluid bolus yet [JK]  2101 D-dimer elevated at 1.03.  With her renal dysfunction would not proceed with CT angio at this  time. [JK]  2122 Case discussed with Dr. [place] Crane regarding admission [JK]    Clinical Course User Index [JK] Dorie Rank, MD                           Medical Decision Making Problems  Addressed: Pneumonia of right lung due to infectious organism, unspecified part of lung: acute illness or injury that poses a threat to life or bodily functions Sepsis with acute renal failure, due to unspecified organism, unspecified acute renal failure type, unspecified whether septic shock present [facility]): acute illness or injury that poses a threat to life or bodily functions  Amount and/or Complexity of Data Reviewed Labs: ordered. Decision-making details documented in ED Course. Radiology: ordered and independent interpretation performed. ECG/medicine tests: ordered.  Risk Prescription drug management.   Patient presented to the ED for evaluation of hypotension and weakness.  Patient immunocompromised undergoing chemotherapy.  Chest x-ray suggestive of pneumonia.  Initial troponin normal.  No findings to suggest cardiac ischemia.  Patient noted to have leukopenia and anemia.  No neutropenia at this time.  Lactic acid level is elevated and she does have an AKI as well as elevated liver enzymes.  Presentation concerning for evolving sepsis.  Patient has been started on broad-spectrum antibiotics and IV fluid boluses.  D-dimer elevated.  Will give a dose of Lovenox to cover for possible PE although at this time favor infection rather than PE.  Patient's blood pressures are still soft although now up into the 18C systolic.  She is responding to IV fluid boluses.  Discussed with nursing staff to complete her 30 cc/kg bolus infusion.        Final Clinical Impression(s) / ED Diagnoses Final diagnoses:  Sepsis with acute renal failure, due to unspecified organism, unspecified acute renal failure type, unspecified whether septic shock present ([place])  Pneumonia of right lung due to infectious organism, unspecified part of lung    Rx / DC Orders ED Discharge Orders     None         Dorie Rank, MD 04/24/22 2122

## 2022-04-24 NOTE — Progress Notes (Addendum)
ANTICOAGULATION CONSULT NOTE   Pharmacy Consult for enoxaparin Indication: Possible PE   Allergies  Allergen Reactions   Sulfa Antibiotics Itching    Patient Measurements: Height: '5\' 6"'$  (167.6 cm) Weight: 91.6 kg (202 lb) IBW/kg (Calculated) : 59.3 Heparin Dosing Weight:   Vital Signs: Temp: 99.2 F (37.3 C) (12/14 1641) Temp Source: Oral (12/14 1641) BP: 85/50 (12/14 1857) Pulse Rate: 86 (12/14 1857)  Labs: Recent Labs    04/24/22 1752  HGB 7.8*  HCT 23.4*  PLT 153  APTT 32  LABPROT 17.1*  INR 1.4*  CREATININE 1.98*  TROPONINIHS 13    Estimated Creatinine Clearance: 32.7 mL/min (A) (by C-G formula based on SCr of 1.98 mg/dL (H)).   Medications:  (Not in a hospital admission)   Assessment: Pharmacy is consulted to dose enoxaparin in 66 yo female with possible PE. MD has ordered enoxaparin due to elevated D-dimer.   Today, 04/24/22  Hgb 7.8 low Plt 153  Scr 1.98 mg/dl, CrCl 33 ml/min  Listed weight 91.6 kg    Goal of Therapy:  Anti-Xa level 0.6-1 units/ml 4hrs after LMWH dose given Monitor platelets by anticoagulation protocol: Yes   Plan:  Enoxaparin 90 SQ x1 for now after discussion with admitting MD. Defer further doses to morning  Monitor CBC, renal function Monitor for signs and symptoms of bleeding   Royetta Asal, PharmD, BCPS 04/24/2022 9:26 PM

## 2022-04-25 DIAGNOSIS — A419 Sepsis, unspecified organism: Secondary | ICD-10-CM | POA: Diagnosis not present

## 2022-04-25 LAB — COMPREHENSIVE METABOLIC PANEL
ALT: 166 U/L — ABNORMAL HIGH (ref 0–44)
AST: 114 U/L — ABNORMAL HIGH (ref 15–41)
Albumin: 2.8 g/dL — ABNORMAL LOW (ref 3.5–5.0)
Alkaline Phosphatase: 158 U/L — ABNORMAL HIGH (ref 38–126)
Anion gap: 5 (ref 5–15)
BUN: 19 mg/dL (ref 8–23)
CO2: 21 mmol/L — ABNORMAL LOW (ref 22–32)
Calcium: 8.6 mg/dL — ABNORMAL LOW (ref 8.9–10.3)
Chloride: 110 mmol/L (ref 98–111)
Creatinine, Ser: 1.23 mg/dL — ABNORMAL HIGH (ref 0.44–1.00)
GFR, Estimated: 49 mL/min — ABNORMAL LOW (ref >60)
Glucose, Bld: 99 mg/dL (ref 70–99)
Potassium: 4 mmol/L (ref 3.5–5.1)
Sodium: 136 mmol/L (ref 135–145)
Total Bilirubin: 0.6 mg/dL (ref 0.3–1.2)
Total Protein: 5.9 g/dL — ABNORMAL LOW (ref 6.5–8.1)

## 2022-04-25 LAB — URINALYSIS, ROUTINE W REFLEX MICROSCOPIC
Bilirubin Urine: NEGATIVE
Glucose, UA: NEGATIVE mg/dL
Hgb urine dipstick: NEGATIVE
Ketones, ur: NEGATIVE mg/dL
Leukocytes,Ua: NEGATIVE
Nitrite: NEGATIVE
Protein, ur: NEGATIVE mg/dL
Specific Gravity, Urine: 1.011 (ref 1.005–1.030)
pH: 5 (ref 5.0–8.0)

## 2022-04-25 LAB — EXPECTORATED SPUTUM ASSESSMENT W GRAM STAIN, RFLX TO RESP C

## 2022-04-25 LAB — CBC WITH DIFFERENTIAL/PLATELET
Abs Immature Granulocytes: 0.04 10*3/uL (ref 0.00–0.07)
Basophils Absolute: 0 10*3/uL (ref 0.0–0.1)
Basophils Relative: 1 %
Eosinophils Absolute: 0 10*3/uL (ref 0.0–0.5)
Eosinophils Relative: 0 %
HCT: 27.3 % — ABNORMAL LOW (ref 36.0–46.0)
Hemoglobin: 9.1 g/dL — ABNORMAL LOW (ref 12.0–15.0)
Immature Granulocytes: 1 %
Lymphocytes Relative: 14 %
Lymphs Abs: 0.5 10*3/uL — ABNORMAL LOW (ref 0.7–4.0)
MCH: 33.3 pg (ref 26.0–34.0)
MCHC: 33.3 g/dL (ref 30.0–36.0)
MCV: 100 fL (ref 80.0–100.0)
Monocytes Absolute: 0.3 10*3/uL (ref 0.1–1.0)
Monocytes Relative: 7 %
Neutro Abs: 3 10*3/uL (ref 1.7–7.7)
Neutrophils Relative %: 77 %
Platelets: 160 10*3/uL (ref 150–400)
RBC: 2.73 MIL/uL — ABNORMAL LOW (ref 3.87–5.11)
RDW: 14.3 % (ref 11.5–15.5)
WBC: 3.8 10*3/uL — ABNORMAL LOW (ref 4.0–10.5)
nRBC: 0 % (ref 0.0–0.2)

## 2022-04-25 LAB — SAMPLE TO BLOOD BANK

## 2022-04-25 LAB — GLUCOSE, CAPILLARY
Glucose-Capillary: 80 mg/dL (ref 70–99)
Glucose-Capillary: 105 mg/dL — ABNORMAL HIGH (ref 70–99)

## 2022-04-25 LAB — PHOSPHORUS: Phosphorus: 2.7 mg/dL (ref 2.5–4.6)

## 2022-04-25 LAB — PREPARE RBC (CROSSMATCH)

## 2022-04-25 LAB — LACTIC ACID, PLASMA: Lactic Acid, Venous: 2.4 mmol/L (ref 0.5–1.9)

## 2022-04-25 LAB — CBG MONITORING, ED
Glucose-Capillary: 81 mg/dL (ref 70–99)
Glucose-Capillary: 88 mg/dL (ref 70–99)

## 2022-04-25 LAB — ABO/RH: ABO/RH(D): O POS

## 2022-04-25 LAB — MAGNESIUM: Magnesium: 2.7 mg/dL — ABNORMAL HIGH (ref 1.7–2.4)

## 2022-04-25 MED ORDER — SODIUM CHLORIDE 0.9% IV SOLUTION: Freq: Once | INTRAVENOUS | Status: DC

## 2022-04-25 MED ORDER — POTASSIUM PHOSPHATES 15 MMOLE/5ML IV SOLN
15.0000 mmol | Freq: Once | INTRAVENOUS | Status: AC
  Administered 2022-04-25: 15 mmol via INTRAVENOUS
  Filled 2022-04-25: qty 5

## 2022-04-25 MED ORDER — ALBUTEROL SULFATE (2.5 MG/3ML) 0.083% IN NEBU: 2.5000 mg | INHALATION_SOLUTION | RESPIRATORY_TRACT | Status: DC | PRN

## 2022-04-25 MED ORDER — IPRATROPIUM-ALBUTEROL 0.5-2.5 (3) MG/3ML IN SOLN
3.0000 mL | Freq: Three times a day (TID) | RESPIRATORY_TRACT | Status: DC
  Administered 2022-04-26 – 2022-04-27 (×4): 3 mL via RESPIRATORY_TRACT
  Filled 2022-04-25 (×4): qty 3

## 2022-04-25 MED ORDER — MAGNESIUM SULFATE 4 GM/100ML IV SOLN
4.0000 g | Freq: Once | INTRAVENOUS | Status: AC
  Administered 2022-04-25: 4 g via INTRAVENOUS
  Filled 2022-04-25: qty 100

## 2022-04-25 MED ORDER — POTASSIUM PHOSPHATES 15 MMOLE/5ML IV SOLN: 30.0000 mmol | Freq: Once | INTRAVENOUS | Status: DC

## 2022-04-25 MED ORDER — ENOXAPARIN SODIUM 40 MG/0.4ML IJ SOSY
40.0000 mg | PREFILLED_SYRINGE | INTRAMUSCULAR | Status: DC
  Administered 2022-04-25: 40 mg via SUBCUTANEOUS
  Filled 2022-04-25: qty 0.4

## 2022-04-25 MED ORDER — INFLUENZA VAC SPLIT QUAD 0.5 ML IM SUSY
0.5000 mL | PREFILLED_SYRINGE | INTRAMUSCULAR | Status: AC
  Administered 2022-04-26: 0.5 mL via INTRAMUSCULAR
  Filled 2022-04-25: qty 0.5

## 2022-04-25 NOTE — ED Notes (Signed)
Patient is alert and oriented  she verbalized understanding of the reasons for blood transfusion and s/sx of reaction.  RN at bedside with patient

## 2022-04-25 NOTE — Progress Notes (Signed)
PROGRESS NOTE  [name] [name]  XNA:[id number] DOB: Nov 29, 1955 DOA: 04/24/2022 PCP: Sandrea Hughs, NP   Brief Narrative: Patient is a 59-year female with history of breast cancer, type 2 diabetes, hyperlipidemia, GERD who presented to the emergency department at the recommendation of her oncologist for the evaluation of low blood pressure.  On presentation, her blood pressure was in the range of 70 systolic.  Received IV fluids.  Patient was also reporting intermittent cough for months, progressive worsening, congestion, weakness.  Chest x-ray showed right lower lobe infiltrate/2 pneumonia.  Started on antibiotics.  Lab work showed creatinine of 2, phosphorus 2.3, magnesium 1.1, hemoglobin of 6.9 ,elevated liver enzymes.  Assessment & Plan:  Principal Problem:   Sepsis ([place])   Sepsis secondary to right lower lobe pneumonia: Presented with congestion, weakness.  Chest x-ray showed right lower lobe infiltrate.  Started on Rocephin and azithromycin.  We will follow-up cultures. Patient tolerating room air. Presented with elevated lactate level.  AKI: Most likely secondary to prerenal physiology with dehydration.  Baseline creatinine of 1.  Presented with creatinine of 2.  Avoid nephrotoxins, continue IV fluids.  Monitor input/output.  Kidney function is significantly improved today.  Elevated liver enzymes: Unclear etiology.  Could be secondary to shock liver.   Continue monitoring. Check hepatitis panel.  Numbers improving  Anemia of chronic disease: Hemoglobin a month ago was 8.6.  Hemoglobin in the range of 6 on presentation.  Transfused with 2 units of of PRBC ordered.  Patient has pancytopenia most likely secondary to her history of malignancy.  Denies any hematochezia, melena or any other source of blood loss.  Posttransfusion, hemoglobin in the range of 9  Hyponatremia: Likely from hypovolemia, improved with IV fluids  Hypophosphatemia/hypomagnesemia: Being supplemented and  monitored  Right breast cancer: S/p neoadjuvant chemotherapy, right lumpectomy, radiation treatment. on anastrozole.  Follows with oncology        DVT prophylaxis:Lovenox     Code Status: Full Code  Family Communication: None at the bedside  Patient status: Inpatient  Patient is from : Home  Anticipated discharge to: Home  Estimated DC date: 1 to 2 days   Consultants: Oncology  Procedures: None  Antimicrobials:  Anti-infectives (From admission, onward)    Start     Dose/Rate Route Frequency Ordered Stop   04/24/22 1715  cefTRIAXone (ROCEPHIN) 2 g in sodium chloride 0.9 % 100 mL IVPB        2 g 200 mL/hr over 30 Minutes Intravenous Every 24 hours 04/24/22 1710 04/29/22 1714   04/24/22 1715  azithromycin (ZITHROMAX) 500 mg in sodium chloride 0.9 % 250 mL IVPB        500 mg 250 mL/hr over 60 Minutes Intravenous Every 24 hours 04/24/22 1710 04/29/22 1714       Subjective: Patient seen and examined at bedside today.  Hemodynamically stable.  She got 2 units of blood transfusion.  Denies any shortness of breath or cough during my evaluation, on room air.  Objective: Vitals:   04/25/22 0630 04/25/22 0645 04/25/22 0732 04/25/22 0743  BP: 109/80 112/81 113/67 118/70  Pulse: 97 (!) 102 93   Resp: (!) 25 (!) 23 (!) 22   Temp:   98.4 F (36.9 C) 98.5 F (36.9 C)  TempSrc:    Oral  SpO2: 100% 100% 100%   Weight:      Height:        Intake/Output Summary (Last 24 hours) at 04/25/2022 0749 Last data filed at 04/25/2022 5427 Gross  per 24 hour  Intake 2083 ml  Output --  Net 2083 ml   Filed Weights   04/24/22 1657  Weight: 91.6 kg    Examination:  General exam: Overall comfortable, not in distress,obese HEENT: PERRL Respiratory system:  no wheezes or crackles , mildly diminished air entry on the right side Cardiovascular system: S1 & S2 heard, RRR.  Gastrointestinal system: Abdomen is nondistended, soft and nontender. Central nervous system: Alert and  oriented Extremities: Trace bilateral lower extremity edema, no clubbing ,no cyanosis Skin: No rashes, no ulcers,no icterus     Data Reviewed: I have personally reviewed following labs and imaging studies  CBC: Recent Labs  Lab 04/21/22 0755 04/24/22 1752 04/24/22 2301  WBC 2.8* 2.5* 2.2*  NEUTROABS 1.8 1.9 1.7  HGB 8.9* 7.8* 6.9*  HCT 26.1* 23.4* 20.8*  MCV 102.8* 105.4* 104.0*  PLT 176 153 277   Basic Metabolic Panel: Recent Labs  Lab 04/21/22 0755 04/24/22 1752 04/24/22 2301  NA 137 128* 131*  K 4.7 4.4 4.6  CL 106 98 103  CO2 25 20* 19*  GLUCOSE 80 129* 114*  BUN '9 17 21  '$ CREATININE 1.04* 1.98* 2.02*  CALCIUM 9.6 8.6* 8.3*  MG  --   --  1.1*  PHOS  --   --  2.3*     Recent Results (from the past 240 hour(s))  Resp panel by RT-PCR (RSV, Flu A&B, Covid) Anterior Nasal Swab     Status: None   Collection Time: 04/24/22  5:05 PM   Specimen: Anterior Nasal Swab  Result Value Ref Range Status   SARS Coronavirus 2 by RT PCR NEGATIVE NEGATIVE Final    Comment: (NOTE) SARS-CoV-2 target nucleic acids are NOT DETECTED.  The SARS-CoV-2 RNA is generally detectable in upper respiratory specimens during the acute phase of infection. The lowest concentration of SARS-CoV-2 viral copies this assay can detect is 138 copies/mL. A negative result does not preclude SARS-Cov-2 infection and should not be used as the sole basis for treatment or other patient management decisions. A negative result may occur with  improper specimen collection/handling, submission of specimen other than nasopharyngeal swab, presence of viral mutation(s) within the areas targeted by this assay, and inadequate number of viral copies(<138 copies/mL). A negative result must be combined with clinical observations, patient history, and epidemiological information. The expected result is Negative.  Fact Sheet for Patients:  EntrepreneurPulse.com.au  Fact Sheet for Healthcare  Providers:  IncredibleEmployment.be  This test is no t yet approved or cleared by the [place] FDA and  has been authorized for detection and/or diagnosis of SARS-CoV-2 by FDA under an Emergency Use Authorization (EUA). This EUA will remain  in effect (meaning this test can be used) for the duration of the COVID-19 declaration under Section 564(b)(1) of the Act, 21 U.S.C.section 360bbb-3(b)(1), unless the authorization is terminated  or revoked sooner.       Influenza A by PCR NEGATIVE NEGATIVE Final   Influenza B by PCR NEGATIVE NEGATIVE Final    Comment: (NOTE) The Xpert Xpress SARS-CoV-2/FLU/RSV plus assay is intended as an aid in the diagnosis of influenza from Nasopharyngeal swab specimens and should not be used as a sole basis for treatment. Nasal washings and aspirates are unacceptable for Xpert Xpress SARS-CoV-2/FLU/RSV testing.  Fact Sheet for Patients: EntrepreneurPulse.com.au  Fact Sheet for Healthcare Providers: IncredibleEmployment.be  This test is not yet approved or cleared by the [place] FDA and has been authorized for detection and/or diagnosis of SARS-CoV-2  by FDA under an Emergency Use Authorization (EUA). This EUA will remain in effect (meaning this test can be used) for the duration of the COVID-19 declaration under Section 564(b)(1) of the Act, 21 U.S.C. section 360bbb-3(b)(1), unless the authorization is terminated or revoked.     Resp Syncytial Virus by PCR NEGATIVE NEGATIVE Final    Comment: (NOTE) Fact Sheet for Patients: EntrepreneurPulse.com.au  Fact Sheet for Healthcare Providers: IncredibleEmployment.be  This test is not yet approved or cleared by the [place] FDA and has been authorized for detection and/or diagnosis of SARS-CoV-2 by FDA under an Emergency Use Authorization (EUA). This EUA will remain in effect (meaning this test can be  used) for the duration of the COVID-19 declaration under Section 564(b)(1) of the Act, 21 U.S.C. section 360bbb-3(b)(1), unless the authorization is terminated or revoked.  Performed at [facility], [place] [address] [place], [place] [address]   Expectorated Sputum Assessment w Gram Stain, Rflx to Resp Cult     Status: None   Collection Time: 04/24/22 11:01 PM   Specimen: Expectorated Sputum  Result Value Ref Range Status   Specimen Description EXPECTORATED SPUTUM  Final   Special Requests Immunocompromised  Final   Sputum evaluation   Final    THIS SPECIMEN IS ACCEPTABLE FOR SPUTUM CULTURE Performed at [facility], [place] [address], [place], [place] [address]    Report Status 04/25/2022 FINAL  Final  Culture, Respiratory w Gram Stain     Status: None (Preliminary result)   Collection Time: 04/24/22 11:01 PM  Result Value Ref Range Status   Specimen Description   Final    EXPECTORATED SPUTUM Performed at [place] [address], [place], [place] [address]    Special Requests   Final    Immunocompromised Reflexed from [id number] Performed at [facility], [place] [address] [place], [place] [address]    Gram Stain   Final    RARE WBC PRESENT, PREDOMINANTLY PMN RARE GRAM NEGATIVE RODS Performed at [facility], [place] [address], [place], [place] [address]    Culture PENDING  Incomplete   Report Status PENDING  Incomplete     Radiology Studies: DG Chest Port 1 View  Result Date: 04/24/2022 CLINICAL DATA:  Questionable sepsis - evaluate for abnormality EXAM: PORTABLE CHEST 1 VIEW COMPARISON:  CT chest 04/23/2020 FINDINGS: Prominent cardiac silhouette. Finding may be due to AP portable technique. Otherwise the heart and mediastinal contours are within normal limits. Right lower lobe patchy airspace and interstitial opacities. Question similar findings to a lesser extent within the left lower lobe. No  pulmonary edema. No pleural effusion. No pneumothorax. No acute osseous abnormality. Surgical clips overlie the right axilla. IMPRESSION: Right lower lobe patchy airspace and interstitial opacities. Question similar findings to a lesser extent within the left lower lobe. Findings suggestive of infection/inflammation. Followup PA and lateral chest X-ray is recommended in 3-4 weeks following therapy to ensure resolution and exclude underlying malignancy. Electronically Signed   By: Iven Finn M.D.   On: 04/24/2022 17:30    Scheduled Meds:  sodium chloride   Intravenous Once   anastrozole  1 mg Oral Daily   atorvastatin  40 mg Oral Daily   calcium-vitamin D  1 tablet Oral Daily   insulin aspart  0-5 Units Subcutaneous QHS   insulin aspart  0-9 Units Subcutaneous TID WC   ipratropium-albuterol  3 mL Nebulization Q6H   pantoprazole  40 mg Oral Daily  Continuous Infusions:  sodium chloride     azithromycin Stopped (04/24/22 1945)   cefTRIAXone (ROCEPHIN)  IV Stopped (04/24/22 1832)   magnesium sulfate bolus IVPB     potassium PHOSPHATE IVPB (in mmol)       LOS: 1 day   Shelly Coss, MD [facility] P12/15/2023, 7:49 AM

## 2022-04-25 NOTE — ED Notes (Signed)
Patient morning labs are delayed due to receiving RBC at this time

## 2022-04-25 NOTE — ED Notes (Addendum)
Patient denies any reaction to the blood product.  Transfusion rate increased to 293m/hour.  BP failed due to patient moving her arm.  Will reassess.

## 2022-04-25 NOTE — ED Notes (Addendum)
Transfusion started at 0307am

## 2022-04-25 NOTE — ED Notes (Signed)
Patient denies any s/sx of adverse reaction to the Blood transfusion.  Rate was increased to 260m/hour at 0535

## 2022-04-25 NOTE — ED Notes (Signed)
Blood transfusion is beginning at this time.

## 2022-04-25 NOTE — ED Notes (Signed)
Patient with no s/sx of allergic reaction.  She has been resting quietly.  Will retrieve the 2nd unit to begin transfusion.

## 2022-04-25 NOTE — ED Notes (Signed)
Patient continues to deny any s/sx of adverse reaction to transfusion

## 2022-04-26 ENCOUNTER — Inpatient Hospital Stay (HOSPITAL_COMMUNITY): Payer: Medicaid Other

## 2022-04-26 ENCOUNTER — Encounter: Payer: Self-pay | Admitting: Oncology

## 2022-04-26 DIAGNOSIS — A419 Sepsis, unspecified organism: Secondary | ICD-10-CM | POA: Diagnosis not present

## 2022-04-26 LAB — COMPREHENSIVE METABOLIC PANEL
ALT: 102 U/L — ABNORMAL HIGH (ref 0–44)
AST: 50 U/L — ABNORMAL HIGH (ref 15–41)
Albumin: 2.5 g/dL — ABNORMAL LOW (ref 3.5–5.0)
Alkaline Phosphatase: 147 U/L — ABNORMAL HIGH (ref 38–126)
Anion gap: 8 (ref 5–15)
BUN: 10 mg/dL (ref 8–23)
CO2: 20 mmol/L — ABNORMAL LOW (ref 22–32)
Calcium: 8.5 mg/dL — ABNORMAL LOW (ref 8.9–10.3)
Chloride: 110 mmol/L (ref 98–111)
Creatinine, Ser: 0.86 mg/dL (ref 0.44–1.00)
GFR, Estimated: >60 mL/min (ref >60)
Glucose, Bld: 90 mg/dL (ref 70–99)
Potassium: 3.9 mmol/L (ref 3.5–5.1)
Sodium: 138 mmol/L (ref 135–145)
Total Bilirubin: 0.4 mg/dL (ref 0.3–1.2)
Total Protein: 5.6 g/dL — ABNORMAL LOW (ref 6.5–8.1)

## 2022-04-26 LAB — HEMOGLOBIN A1C
Hgb A1c MFr Bld: 5.7 % — ABNORMAL HIGH (ref 4.8–5.6)
Mean Plasma Glucose: 117 mg/dL

## 2022-04-26 LAB — TYPE AND SCREEN
ABO/RH(D): O POS
Antibody Screen: NEGATIVE
Unit division: 0
Unit division: 0

## 2022-04-26 LAB — CBC
HCT: 25.8 % — ABNORMAL LOW (ref 36.0–46.0)
Hemoglobin: 8.5 g/dL — ABNORMAL LOW (ref 12.0–15.0)
MCH: 33.1 pg (ref 26.0–34.0)
MCHC: 32.9 g/dL (ref 30.0–36.0)
MCV: 100.4 fL — ABNORMAL HIGH (ref 80.0–100.0)
Platelets: 149 10*3/uL — ABNORMAL LOW (ref 150–400)
RBC: 2.57 MIL/uL — ABNORMAL LOW (ref 3.87–5.11)
RDW: 14.5 % (ref 11.5–15.5)
WBC: 5.8 10*3/uL (ref 4.0–10.5)
nRBC: 0 % (ref 0.0–0.2)

## 2022-04-26 LAB — BPAM RBC
Blood Product Expiration Date: 202401142359
Blood Product Expiration Date: 202401152359
ISSUE DATE / TIME: 202312150251
ISSUE DATE / TIME: 202312150503
Unit Type and Rh: 5100
Unit Type and Rh: 5100

## 2022-04-26 LAB — HEPATITIS PANEL, ACUTE
HCV Ab: NONREACTIVE
Hep A IgM: NONREACTIVE
Hep B C IgM: NONREACTIVE
Hepatitis B Surface Ag: NONREACTIVE

## 2022-04-26 LAB — GLUCOSE, CAPILLARY
Glucose-Capillary: 85 mg/dL (ref 70–99)
Glucose-Capillary: 97 mg/dL (ref 70–99)
Glucose-Capillary: 104 mg/dL — ABNORMAL HIGH (ref 70–99)
Glucose-Capillary: 111 mg/dL — ABNORMAL HIGH (ref 70–99)

## 2022-04-26 MED ORDER — GUAIFENESIN ER 600 MG PO TB12
600.0000 mg | ORAL_TABLET | Freq: Two times a day (BID) | ORAL | Status: DC
  Administered 2022-04-26 – 2022-04-27 (×3): 600 mg via ORAL
  Filled 2022-04-26 (×3): qty 1

## 2022-04-26 MED ORDER — HYDROCOD POLI-CHLORPHE POLI ER 10-8 MG/5ML PO SUER
5.0000 mL | Freq: Two times a day (BID) | ORAL | Status: DC
  Administered 2022-04-26 – 2022-04-27 (×2): 5 mL via ORAL
  Filled 2022-04-26 (×2): qty 5

## 2022-04-26 MED ORDER — IOHEXOL 300 MG/ML  SOLN
75.0000 mL | Freq: Once | INTRAMUSCULAR | Status: AC | PRN
  Administered 2022-04-26: 75 mL via INTRAVENOUS

## 2022-04-26 NOTE — Progress Notes (Signed)
Mobility Specialist - Progress Note   04/26/22 1236  Mobility  Activity Ambulated with assistance in hallway  Level of Assistance Standby assist, set-up cues, supervision of patient - no hands on  Assistive Device Cane  Distance Ambulated (ft) 350 ft  Range of Motion/Exercises Active  Activity Response Tolerated well  Mobility Referral Yes  $Mobility charge 1 Mobility   Pt was found in bed and agreeable to ambulate. During ambulation stated her rib cage hurts when coughing and had x1 LOB that she was able to self correct by switch the cane from the left to her right side. At EOS returned to bed with necessities in reach.  Ferd Hibbs Mobility Specialist

## 2022-04-26 NOTE — Progress Notes (Signed)
PROGRESS NOTE  [name] [name]  IWP:[id number] DOB: [birth year] DOA: 04/24/2022 PCP: Sandrea Hughs, NP   Brief Narrative: Patient is a 49-year female with history of breast cancer, type 2 diabetes, hyperlipidemia, GERD who presented to the emergency department at the recommendation of her oncologist for the evaluation of low blood pressure.  On presentation, her blood pressure was in the range of 70 systolic.  Received IV fluids.  Patient was also reporting intermittent cough for months, progressive worsening, congestion, weakness.  Chest x-ray showed right lower lobe infiltrate/2 pneumonia.  Started on antibiotics.  Lab work showed creatinine of 2, phosphorus 2.3, magnesium 1.1, hemoglobin of 6.9 ,elevated liver enzymes.  Respiratory status improved, remains on room air but has intermittent hemoptysis with blood tinged sputm  Assessment & Plan:  Principal Problem:   Sepsis ([place])   Sepsis secondary to right lower lobe pneumonia: Presented with congestion, weakness.  Chest x-ray showed right lower lobe infiltrate.  Started on Rocephin and azithromycin.  Blood cultures have been negative.  Sputum cultures showing rare gram-negative rods Patient tolerating room air. Presented with elevated lactate level.  Hemoptysis: Patient has blood-tinged sputum.  Can see some fresh blood in the sputum.  Will check CT chest with contrast.  This is unlikely causing her anemia  AKI: Resolved  Elevated liver enzymes: Unclear etiology.  Could be secondary to shock liver.   Continue monitoring. Check hepatitis panel, pending.  Numbers improving  Anemia of chronic disease: Hemoglobin a month ago was 8.6.  Hemoglobin in the range of 6 on presentation.  Transfused with 2 units of of PRBC ordered.  Patient has pancytopenia most likely secondary to her history of malignancy.  Denies any hematochezia, melena or any other source of blood loss.  Posttransfusion, hemoglobin in the range of 9 but ended up to 8.5.  Check CBC  tomorrow.  Hyponatremia: Resolved with IV fluids  Hypophosphatemia/hypomagnesemia: Supplemented and corrected  Right breast cancer: S/p neoadjuvant chemotherapy, right lumpectomy, radiation treatment. on anastrozole.  Follows with oncology  Obesity: BMI 32.6        DVT prophylaxis:enoxaparin (LOVENOX) injection 40 mg Start: 04/25/22 2200Lovenox     Code Status: Full Code  Family Communication: None at the bedside  Patient status: Inpatient  Patient is from : Home  Anticipated discharge to: Home  Estimated DC date: tomorrow   Consultants:   Procedures: None  Antimicrobials:  Anti-infectives (From admission, onward)    Start     Dose/Rate Route Frequency Ordered Stop   04/24/22 1715  cefTRIAXone (ROCEPHIN) 2 g in sodium chloride 0.9 % 100 mL IVPB        2 g 200 mL/hr over 30 Minutes Intravenous Every 24 hours 04/24/22 1710 04/29/22 1714   04/24/22 1715  azithromycin (ZITHROMAX) 500 mg in sodium chloride 0.9 % 250 mL IVPB        500 mg 250 mL/hr over 60 Minutes Intravenous Every 24 hours 04/24/22 1710 04/29/22 1714       Subjective: Patient seen and examined at bedside today.  Hemodynamically stable on room air.  Denies any worsening shortness of breath.  But frequently coughing and having blood-tinged sputum.  Objective: Vitals:   04/25/22 2239 04/26/22 0225 04/26/22 0643 04/26/22 0824  BP: 112/69 110/72 123/78   Pulse: 95 93 89   Resp: '17 20 18 '$ (!) 29  Temp: 99.3 F (37.4 C) 99.5 F (37.5 C) 99.5 F (37.5 C)   TempSrc: Oral Oral Oral   SpO2: 98% 98% 99% 99%  Weight:      Height:        Intake/Output Summary (Last 24 hours) at 04/26/2022 1122 Last data filed at 04/26/2022 0900 Gross per 24 hour  Intake 2374.13 ml  Output --  Net 2374.13 ml   Filed Weights   04/24/22 1657  Weight: 91.6 kg    Examination:  General exam: Overall comfortable, not in distress,obese HEENT: PERRL Respiratory system:  no wheezes or crackles  Cardiovascular  system: S1 & S2 heard, RRR.  Gastrointestinal system: Abdomen is nondistended, soft and nontender. Central nervous system: Alert and oriented Extremities: No edema, no clubbing ,no cyanosis Skin: No rashes, no ulcers,no icterus     Data Reviewed: I have personally reviewed following labs and imaging studies  CBC: Recent Labs  Lab 04/21/22 0755 04/24/22 1752 04/24/22 2301 04/25/22 0906 04/26/22 0433  WBC 2.8* 2.5* 2.2* 3.8* 5.8  NEUTROABS 1.8 1.9 1.7 3.0  --   HGB 8.9* 7.8* 6.9* 9.1* 8.5*  HCT 26.1* 23.4* 20.8* 27.3* 25.8*  MCV 102.8* 105.4* 104.0* 100.0 100.4*  PLT 176 153 150 160 397*   Basic Metabolic Panel: Recent Labs  Lab 04/21/22 0755 04/24/22 1752 04/24/22 2301 04/25/22 0906 04/26/22 0433  NA 137 128* 131* 136 138  K 4.7 4.4 4.6 4.0 3.9  CL 106 98 103 110 110  CO2 25 20* 19* 21* 20*  GLUCOSE 80 129* 114* 99 90  BUN '9 17 21 19 10  '$ CREATININE 1.04* 1.98* 2.02* 1.23* 0.86  CALCIUM 9.6 8.6* 8.3* 8.6* 8.5*  MG  --   --  1.1* 2.7*  --   PHOS  --   --  2.3* 2.7  --      Recent Results (from the past 240 hour(s))  Resp panel by RT-PCR (RSV, Flu A&B, Covid) Anterior Nasal Swab     Status: None   Collection Time: 04/24/22  5:05 PM   Specimen: Anterior Nasal Swab  Result Value Ref Range Status   SARS Coronavirus 2 by RT PCR NEGATIVE NEGATIVE Final    Comment: (NOTE) SARS-CoV-2 target nucleic acids are NOT DETECTED.  The SARS-CoV-2 RNA is generally detectable in upper respiratory specimens during the acute phase of infection. The lowest concentration of SARS-CoV-2 viral copies this assay can detect is 138 copies/mL. A negative result does not preclude SARS-Cov-2 infection and should not be used as the sole basis for treatment or other patient management decisions. A negative result may occur with  improper specimen collection/handling, submission of specimen other than nasopharyngeal swab, presence of viral mutation(s) within the areas targeted by this assay,  and inadequate number of viral copies(<138 copies/mL). A negative result must be combined with clinical observations, patient history, and epidemiological information. The expected result is Negative.  Fact Sheet for Patients:  EntrepreneurPulse.com.au  Fact Sheet for Healthcare Providers:  IncredibleEmployment.be  This test is no t yet approved or cleared by the [place] FDA and  has been authorized for detection and/or diagnosis of SARS-CoV-2 by FDA under an Emergency Use Authorization (EUA). This EUA will remain  in effect (meaning this test can be used) for the duration of the COVID-19 declaration under Section 564(b)(1) of the Act, 21 U.S.C.section 360bbb-3(b)(1), unless the authorization is terminated  or revoked sooner.       Influenza A by PCR NEGATIVE NEGATIVE Final   Influenza B by PCR NEGATIVE NEGATIVE Final    Comment: (NOTE) The Xpert Xpress SARS-CoV-2/FLU/RSV plus assay is intended as an aid in the diagnosis of  influenza from Nasopharyngeal swab specimens and should not be used as a sole basis for treatment. Nasal washings and aspirates are unacceptable for Xpert Xpress SARS-CoV-2/FLU/RSV testing.  Fact Sheet for Patients: EntrepreneurPulse.com.au  Fact Sheet for Healthcare Providers: IncredibleEmployment.be  This test is not yet approved or cleared by the [place] FDA and has been authorized for detection and/or diagnosis of SARS-CoV-2 by FDA under an Emergency Use Authorization (EUA). This EUA will remain in effect (meaning this test can be used) for the duration of the COVID-19 declaration under Section 564(b)(1) of the Act, 21 U.S.C. section 360bbb-3(b)(1), unless the authorization is terminated or revoked.     Resp Syncytial Virus by PCR NEGATIVE NEGATIVE Final    Comment: (NOTE) Fact Sheet for Patients: EntrepreneurPulse.com.au  Fact Sheet for  Healthcare Providers: IncredibleEmployment.be  This test is not yet approved or cleared by the [place] FDA and has been authorized for detection and/or diagnosis of SARS-CoV-2 by FDA under an Emergency Use Authorization (EUA). This EUA will remain in effect (meaning this test can be used) for the duration of the COVID-19 declaration under Section 564(b)(1) of the Act, 21 U.S.C. section 360bbb-3(b)(1), unless the authorization is terminated or revoked.  Performed at [facility], [place] [address] [place], [place] [address]   Blood Culture (routine x 2)     Status: None (Preliminary result)   Collection Time: 04/24/22  5:38 PM   Specimen: BLOOD  Result Value Ref Range Status   Specimen Description   Final    BLOOD RIGHT ANTECUBITAL Performed at [facility] [address] [place], [place] [address]    Special Requests   Final    BOTTLES DRAWN AEROBIC AND ANAEROBIC Blood Culture results may not be optimal due to an excessive volume of blood received in culture bottles Performed at [facility] [address], [place], [place] [address]    Culture   Final    NO GROWTH 2 DAYS Performed at [facility] [address], [place], [place] [address]    Report Status PENDING  Incomplete  Blood Culture (routine x 2)     Status: None (Preliminary result)   Collection Time: 04/24/22  5:52 PM   Specimen: BLOOD  Result Value Ref Range Status   Specimen Description   Final    BLOOD LEFT ANTECUBITAL Performed at [facility] [address] [place], [place] [address]    Special Requests   Final    BOTTLES DRAWN AEROBIC AND ANAEROBIC Blood Culture results may not be optimal due to an excessive volume of blood received in culture bottles Performed at [facility] [address] [place], [place] [address]    Culture   Final    NO GROWTH 2 DAYS Performed at [facility] [address], [place], [place] [address]    Report Status PENDING  Incomplete  Expectorated Sputum Assessment w Gram Stain, Rflx to Resp Cult     Status: None   Collection Time: 04/24/22 11:01 PM   Specimen: Expectorated Sputum  Result Value Ref Range Status   Specimen Description EXPECTORATED SPUTUM  Final   Special Requests Immunocompromised  Final   Sputum evaluation   Final    THIS SPECIMEN IS ACCEPTABLE FOR SPUTUM CULTURE Performed at [facility], [place] [address], [place],  [address]    Report Status 04/25/2022 FINAL  Final  Culture, Respiratory w Gram Stain  Status: None (Preliminary result)   Collection Time: 04/24/22 11:01 PM  Result Value Ref Range Status   Specimen Description   Final    EXPECTORATED SPUTUM Performed at [facility] [address], [place], [place] [address]    Special Requests   Final    Immunocompromised Reflexed from [id number] Performed at [facility], [place] [address] [place], [place] [address]    Gram Stain   Final    RARE WBC PRESENT, PREDOMINANTLY PMN RARE GRAM NEGATIVE RODS    Culture   Final    RARE GRAM NEGATIVE RODS CULTURE REINCUBATED FOR BETTER GROWTH Performed at [facility], [place] [address], [place], [place] [address]    Report Status PENDING  Incomplete     Radiology Studies: DG Chest Port 1 View  Result Date: 04/24/2022 CLINICAL DATA:  Questionable sepsis - evaluate for abnormality EXAM: PORTABLE CHEST 1 VIEW COMPARISON:  CT chest 04/23/2020 FINDINGS: Prominent cardiac silhouette. Finding may be due to AP portable technique. Otherwise the heart and mediastinal contours are within normal limits. Right lower lobe patchy airspace and interstitial opacities. Question similar findings to a lesser extent within the left lower lobe. No pulmonary edema. No pleural effusion. No pneumothorax. No acute osseous abnormality. Surgical clips overlie the right  axilla. IMPRESSION: Right lower lobe patchy airspace and interstitial opacities. Question similar findings to a lesser extent within the left lower lobe. Findings suggestive of infection/inflammation. Followup PA and lateral chest X-ray is recommended in 3-4 weeks following therapy to ensure resolution and exclude underlying malignancy. Electronically Signed   By: Iven Finn M.D.   On: 04/24/2022 17:30    Scheduled Meds:  sodium chloride   Intravenous Once   anastrozole  1 mg Oral Daily   calcium-vitamin D  1 tablet Oral Daily   enoxaparin (LOVENOX) injection  40 mg Subcutaneous Q24H   guaiFENesin  600 mg Oral BID   insulin aspart  0-5 Units Subcutaneous QHS   insulin aspart  0-9 Units Subcutaneous TID WC   ipratropium-albuterol  3 mL Nebulization TID   pantoprazole  40 mg Oral Daily   Continuous Infusions:  azithromycin 500 mg (04/25/22 1716)   cefTRIAXone (ROCEPHIN)  IV 2 g (04/25/22 1822)     LOS: 2 days   Shelly Coss, MD [facility] P12/16/2023, 11:22 AM

## 2022-04-26 NOTE — Plan of Care (Signed)
  Problem: Education: Goal: Knowledge of General Education information will improve Description Including pain rating scale, medication(s)/side effects and non-pharmacologic comfort measures Outcome: Progressing   Problem: Health Behavior/Discharge Planning: Goal: Ability to manage health-related needs will improve Outcome: Progressing   

## 2022-04-27 DIAGNOSIS — A419 Sepsis, unspecified organism: Secondary | ICD-10-CM | POA: Diagnosis not present

## 2022-04-27 LAB — CBC
HCT: 24.7 % — ABNORMAL LOW (ref 36.0–46.0)
Hemoglobin: 8.3 g/dL — ABNORMAL LOW (ref 12.0–15.0)
MCH: 33.7 pg (ref 26.0–34.0)
MCHC: 33.6 g/dL (ref 30.0–36.0)
MCV: 100.4 fL — ABNORMAL HIGH (ref 80.0–100.0)
Platelets: 144 10*3/uL — ABNORMAL LOW (ref 150–400)
RBC: 2.46 MIL/uL — ABNORMAL LOW (ref 3.87–5.11)
RDW: 14 % (ref 11.5–15.5)
WBC: 5.3 10*3/uL (ref 4.0–10.5)
nRBC: 0 % (ref 0.0–0.2)

## 2022-04-27 LAB — GLUCOSE, CAPILLARY
Glucose-Capillary: 87 mg/dL (ref 70–99)
Glucose-Capillary: 82 mg/dL (ref 70–99)

## 2022-04-27 MED ORDER — HYDROCOD POLI-CHLORPHE POLI ER 10-8 MG/5ML PO SUER: 5.0000 mL | Freq: Two times a day (BID) | ORAL | 0 refills | Status: DC

## 2022-04-27 MED ORDER — GUAIFENESIN ER 600 MG PO TB12: 600.0000 mg | ORAL_TABLET | Freq: Two times a day (BID) | ORAL | 0 refills | Status: DC

## 2022-04-27 MED ORDER — LEVOFLOXACIN 750 MG PO TABS: 750.0000 mg | ORAL_TABLET | Freq: Every day | ORAL | 0 refills | Status: AC

## 2022-04-27 NOTE — Plan of Care (Signed)
  Problem: Education: Goal: Knowledge of General Education information will improve Description Including pain rating scale, medication(s)/side effects and non-pharmacologic comfort measures Outcome: Progressing   Problem: Health Behavior/Discharge Planning: Goal: Ability to manage health-related needs will improve Outcome: Progressing   

## 2022-04-27 NOTE — TOC Transition Note (Signed)
Transition of Care [facility]) - CM/SW Discharge Note   Patient Details  Name: [name] [name] MRN: [id number] Date of Birth: [birth year]  [facility]) CM/SW Contact:  Lennart Pall, LCSW Phone Number: 04/27/2022, 11:40 AM   Clinical Narrative:     Provided pt with Good RX discount for levofloxacin  which is a new medicine for her.  She confirms [facility] as her pharmacy.  No further TOC needs.  Final next level of care: Home/Self Care Barriers to Discharge: Barriers Resolved   Patient Goals and CMS Choice Patient states their goals for this hospitalization and ongoing recovery are:: return home      Discharge Placement                       Discharge Plan and Services                DME Arranged: N/A DME Agency: NA                  Social Determinants of Health ([place]) Interventions     Readmission Risk Interventions    04/27/2022   11:39 AM  Readmission Risk Prevention Plan  Transportation Screening Complete  PCP or Specialist Appt within 5-7 Days Complete  Home Care Screening Complete  Medication Review (RN CM) Complete

## 2022-04-27 NOTE — Discharge Summary (Signed)
Physician Discharge Summary  [name] [name] SNK:[id number] DOB: 1956-04-14 DOA: 04/24/2022  PCP: Sandrea Hughs, NP  Admit date: 04/24/2022 Discharge date: 04/27/2022  Admitted From: Home Disposition:  Home  Discharge Condition:Stable CODE STATUS:FULL, DNR, Comfort Care Diet recommendation: Heart Healthy / Carb Modified / Regular / Dysphagia   Brief/Interim Summary: Patient is a 59-year female with history of breast cancer, type 2 diabetes, hyperlipidemia, GERD who presented to the emergency department at the recommendation of her oncologist for the evaluation of low blood pressure.  On presentation, her blood pressure was in the range of 70 systolic.  Received IV fluids.  Patient was also reporting intermittent cough for months, progressive worsening, congestion, weakness.  Chest x-ray showed right lower lobe infiltrate/2 pneumonia.  Started on antibiotics.  Lab work showed creatinine of 2, phosphorus 2.3, magnesium 1.1, hemoglobin of 6.9 ,elevated liver enzymes.  Respiratory status improved, remains on room air .Also had  intermittent hemoptysis with blood tinged sputum , this has improved today.  She feels comfortable to go home today.  Following problems were addressed during hospitalization:  Sepsis secondary to right lower lobe pneumonia: Presented with congestion, weakness.  Chest x-ray showed right lower lobe infiltrate.  Started on Rocephin and azithromycin.  Blood cultures have been negative.  Sputum cultures showing rare gram-negative rods Patient tolerating room air.  She will be discharged on 4 days course of levofloxacin to complete 7 days course of antibiotics   Hemoptysis: Patient had blood-tinged sputum.  This is unlikely causing her anemia.CT chest with contrast showed ground-glass opacities with associated scattered small patchy consolidations in the inferior aspect of the right lower lobe are favored to reflect pulmonary hemorrhage.  Blood-tinged sputum is clearing up, she  does not complain of any shortness of breath today at rest and her lungs are clear to auscultation .  Hematemesis most likely from pneumonia.  Continue to biotics.   AKI: Resolved   Elevated liver enzymes: Unclear etiology.  Could be secondary to shock liver as she was hypotensive on presentation.  Hepatitis panel negative.  Liver enzymes have improved.    Anemia of chronic disease: Hemoglobin a month ago was 8.6.  Hemoglobin in the range of 6 on presentation.  Transfused with 2 units of of PRBC ordered.  Patient has pancytopenia most likely secondary to her history of malignancy.  Denies any hematochezia, melena or any other source of blood loss.  Hemoglobin still in the range of 8.   Hyponatremia: Resolved with IV fluids  Hyperlipidemia: Takes Lipitor at home.  Since her liver enzymes improved, she may continue Lipitor on discharge.  Check CMP in a week   Hypophosphatemia/hypomagnesemia: Supplemented and corrected   Right breast cancer: S/p neoadjuvant chemotherapy, right lumpectomy, radiation treatment. on anastrozole. Was also on abemaciclib.  Her oncologist recommended to discontinue this medication on discharge.  Recommended follow-up with oncology as an outpatient   Obesity: BMI 32.6  Discharge Diagnoses:  Principal Problem:   Sepsis [facility])    Discharge Instructions  Discharge Instructions     Diet - low sodium heart healthy   Complete by: As directed    Discharge instructions   Complete by: As directed    1)Please take prescribed medications as instructed 2)Follow up with your PCP in a week.  Do a CBC just to check your hemoglobin a week and also a CMP test to check your liver enzymes 3)Follow up with your oncologist as soon as possible 4)You you have worsening hemoptysis or cough ,please come to  the emergency department   Increase activity slowly   Complete by: As directed       Allergies as of 04/27/2022       Reactions   Sulfa Antibiotics Itching         Medication List     STOP taking these medications    amoxicillin 500 MG tablet Commonly known as: AMOXIL   Verzenio 100 MG tablet Generic drug: abemaciclib       TAKE these medications    anastrozole 1 MG tablet Commonly known as: ARIMIDEX Take 1 tablet (1 mg total) by mouth daily.   ascorbic acid 500 MG tablet Commonly known as: VITAMIN C Take 500 mg by mouth daily.   atorvastatin 40 MG tablet Commonly known as: LIPITOR Take 1 tablet (40 mg total) by mouth daily.   calcium-vitamin D 500-5 MG-MCG tablet Commonly known as: OSCAL WITH D Take 1 tablet by mouth daily. Will bring in dosing to next visit   carvedilol 6.25 MG tablet Commonly known as: COREG Take 1 tablet (6.25 mg total) by mouth 2 (two) times daily with a meal.   CENTRUM SILVER 50+WOMEN PO Take 1 capsule by mouth daily.   chlorpheniramine-HYDROcodone 10-8 MG/5ML Commonly known as: TUSSIONEX Take 5 mLs by mouth every 12 (twelve) hours for 5 days.   diclofenac 75 MG EC tablet Commonly known as: VOLTAREN Take 1 tablet (75 mg total) by mouth 2 (two) times daily.   guaiFENesin 600 MG 12 hr tablet Commonly known as: MUCINEX Take 1 tablet (600 mg total) by mouth 2 (two) times daily for 7 days.   lansoprazole 30 MG capsule Commonly known as: PREVACID Take 1 capsule (30 mg total) by mouth every morning.   levofloxacin 750 MG tablet Commonly known as: Levaquin Take 1 tablet (750 mg total) by mouth daily for 4 days.   Magnesium 100 MG Caps Take 100 mg by mouth daily.   metFORMIN 1000 MG tablet Commonly known as: GLUCOPHAGE Take 1 tablet (1,000 mg total) by mouth 2 (two) times daily with a meal.   UNABLE TO FIND Med Name: [place]. Pt reports taking 1 scoopful in tea or water   valsartan 320 MG tablet Commonly known as: DIOVAN Take 1 tablet (320 mg total) by mouth daily.        Follow-up Information     Ngetich, Dinah C, NP. Schedule an appointment as soon as possible for a visit in 1  week(s).   Specialty: Family Medicine Contact information: [place] [address] [phone]                Allergies  Allergen Reactions   Sulfa Antibiotics Itching    Consultations: None   Procedures/Studies: CT CHEST W CONTRAST  Result Date: 04/26/2022 CLINICAL DATA:  Hemoptysis. EXAM: CT CHEST WITH CONTRAST TECHNIQUE: Multidetector CT imaging of the chest was performed during intravenous contrast administration. RADIATION DOSE REDUCTION: This exam was performed according to the departmental dose-optimization program which includes automated exposure control, adjustment of the mA and/or kV according to patient size and/or use of iterative reconstruction technique. CONTRAST:  65m OMNIPAQUE IOHEXOL 300 MG/ML  SOLN COMPARISON:  CT chest 04/23/2020 FINDINGS: Cardiovascular: No significant vascular findings. Mild aortic and coronary artery calcifications. Normal heart size. No pericardial effusion. Mediastinum/Nodes: No enlarged mediastinal, hilar, or axillary lymph nodes. Thyroid gland, trachea, and esophagus demonstrate no significant findings. Lungs/Pleura: Ground-glass opacities with associated scattered small patchy consolidations in the inferior right lower lobe. Additional small ground-glass  opacity noted in the left lower lobe. Subpleural reticulation in the anterolateral aspect of the right upper lobe. Subsegmental atelectasis in the lingula. No pleural effusion or pneumothorax. Upper Abdomen: No acute abnormality. Musculoskeletal: Age-indeterminate superior endplate compression fracture of the T4 vertebral body with approximately 25% vertebral body height loss, new compared to 04/23/2020 (series 6, image 125). Subtle 1 cm sclerotic lesion in the left posterior aspect of the T1 vertebral body is unchanged compared to 04/23/2020 (series 6, image 127; series 2, image 18). IMPRESSION: 1. Ground-glass opacities with associated scattered small patchy consolidations in the  inferior aspect of the right lower lobe are favored to reflect pulmonary hemorrhage given clinical history of hemoptysis. No discrete underlying mass identified. 2. Small ground-glass opacity in the left lower lobe could reflect additional localized hemorrhage versus infectious versus inflammatory process. 3. Age-indeterminate superior endplate compression fracture of the T4 vertebral body. If clinically desired, further assessment regarding the acuity of the compression fracture could be performed with MRI of the thoracic spine. 4. Aortic and coronary artery atherosclerosis. Aortic Atherosclerosis (ICD10-I70.0). Electronically Signed   By: Ileana Roup M.D.   On: 04/26/2022 18:19   DG Chest Port 1 View  Result Date: 04/24/2022 CLINICAL DATA:  Questionable sepsis - evaluate for abnormality EXAM: PORTABLE CHEST 1 VIEW COMPARISON:  CT chest 04/23/2020 FINDINGS: Prominent cardiac silhouette. Finding may be due to AP portable technique. Otherwise the heart and mediastinal contours are within normal limits. Right lower lobe patchy airspace and interstitial opacities. Question similar findings to a lesser extent within the left lower lobe. No pulmonary edema. No pleural effusion. No pneumothorax. No acute osseous abnormality. Surgical clips overlie the right axilla. IMPRESSION: Right lower lobe patchy airspace and interstitial opacities. Question similar findings to a lesser extent within the left lower lobe. Findings suggestive of infection/inflammation. Followup PA and lateral chest X-ray is recommended in 3-4 weeks following therapy to ensure resolution and exclude underlying malignancy. Electronically Signed   By: Iven Finn M.D.   On: 04/24/2022 17:30      Subjective: Patient seen and examined at bedside today.  Hemodynamically stable.  Coughing is improved.  Hemodialysis has improved today, less frequent blood-tinged sputum.  On room air.  Feels comfortable to go home  Discharge Exam: Vitals:    04/27/22 0543 04/27/22 0750  BP: 133/85   Pulse: 91   Resp: 20   Temp: 98.8 F (37.1 C)   SpO2: 97% 94%   Vitals:   04/26/22 1920 04/26/22 2158 04/27/22 0543 04/27/22 0750  BP:  133/82 133/85   Pulse:  (!) 103 91   Resp:  20 20   Temp:  100.2 F (37.9 C) 98.8 F (37.1 C)   TempSrc:  Oral Oral   SpO2: 96% 99% 97% 94%  Weight:      Height:        General: Pt is alert, awake, not in acute distress, obese Cardiovascular: RRR, S1/S2 +, no rubs, no gallops Respiratory: CTA bilaterally, no wheezing, no rhonchi Abdominal: Soft, NT, ND, bowel sounds + Extremities: no edema, no cyanosis    The results of significant diagnostics from this hospitalization (including imaging, microbiology, ancillary and laboratory) are listed below for reference.     Microbiology: Recent Results (from the past 240 hour(s))  Resp panel by RT-PCR (RSV, Flu A&B, Covid) Anterior Nasal Swab     Status: None   Collection Time: 04/24/22  5:05 PM   Specimen: Anterior Nasal Swab  Result Value Ref Range Status  SARS Coronavirus 2 by RT PCR NEGATIVE NEGATIVE Final    Comment: (NOTE) SARS-CoV-2 target nucleic acids are NOT DETECTED.  The SARS-CoV-2 RNA is generally detectable in upper respiratory specimens during the acute phase of infection. The lowest concentration of SARS-CoV-2 viral copies this assay can detect is 138 copies/mL. A negative result does not preclude SARS-Cov-2 infection and should not be used as the sole basis for treatment or other patient management decisions. A negative result may occur with  improper specimen collection/handling, submission of specimen other than nasopharyngeal swab, presence of viral mutation(s) within the areas targeted by this assay, and inadequate number of viral copies(<138 copies/mL). A negative result must be combined with clinical observations, patient history, and epidemiological information. The expected result is Negative.  Fact Sheet for Patients:   EntrepreneurPulse.com.au  Fact Sheet for Healthcare Providers:  IncredibleEmployment.be  This test is no t yet approved or cleared by the [place] FDA and  has been authorized for detection and/or diagnosis of SARS-CoV-2 by FDA under an Emergency Use Authorization (EUA). This EUA will remain  in effect (meaning this test can be used) for the duration of the COVID-19 declaration under Section 564(b)(1) of the Act, 21 U.S.C.section 360bbb-3(b)(1), unless the authorization is terminated  or revoked sooner.       Influenza A by PCR NEGATIVE NEGATIVE Final   Influenza B by PCR NEGATIVE NEGATIVE Final    Comment: (NOTE) The Xpert Xpress SARS-CoV-2/FLU/RSV plus assay is intended as an aid in the diagnosis of influenza from Nasopharyngeal swab specimens and should not be used as a sole basis for treatment. Nasal washings and aspirates are unacceptable for Xpert Xpress SARS-CoV-2/FLU/RSV testing.  Fact Sheet for Patients: EntrepreneurPulse.com.au  Fact Sheet for Healthcare Providers: IncredibleEmployment.be  This test is not yet approved or cleared by the [place] FDA and has been authorized for detection and/or diagnosis of SARS-CoV-2 by FDA under an Emergency Use Authorization (EUA). This EUA will remain in effect (meaning this test can be used) for the duration of the COVID-19 declaration under Section 564(b)(1) of the Act, 21 U.S.C. section 360bbb-3(b)(1), unless the authorization is terminated or revoked.     Resp Syncytial Virus by PCR NEGATIVE NEGATIVE Final    Comment: (NOTE) Fact Sheet for Patients: EntrepreneurPulse.com.au  Fact Sheet for Healthcare Providers: IncredibleEmployment.be  This test is not yet approved or cleared by the [place] FDA and has been authorized for detection and/or diagnosis of SARS-CoV-2 by FDA under an Emergency Use  Authorization (EUA). This EUA will remain in effect (meaning this test can be used) for the duration of the COVID-19 declaration under Section 564(b)(1) of the Act, 21 U.S.C. section 360bbb-3(b)(1), unless the authorization is terminated or revoked.  Performed at [facility], [place] [address], [place], [place] [address]   Blood Culture (routine x 2)     Status: None (Preliminary result)   Collection Time: 04/24/22  5:38 PM   Specimen: BLOOD  Result Value Ref Range Status   Specimen Description   Final    BLOOD RIGHT ANTECUBITAL Performed at [place] [address], [place], [place] [address]    Special Requests   Final    BOTTLES DRAWN AEROBIC AND ANAEROBIC Blood Culture results may not be optimal due to an excessive volume of blood received in culture bottles Performed at [place] [address], [place],  [address]    Culture   Final    NO GROWTH 3 DAYS  Performed at [facility], [place] [address] [place], [place] [address]    Report Status PENDING  Incomplete  Blood Culture (routine x 2)     Status: None (Preliminary result)   Collection Time: 04/24/22  5:52 PM   Specimen: BLOOD  Result Value Ref Range Status   Specimen Description   Final    BLOOD LEFT ANTECUBITAL Performed at [place] [address] [place], [place] [address]    Special Requests   Final    BOTTLES DRAWN AEROBIC AND ANAEROBIC Blood Culture results may not be optimal due to an excessive volume of blood received in culture bottles Performed at [place] [address], [place], [place] [address]    Culture   Final    NO GROWTH 3 DAYS Performed at [facility], [place] [address], [place], [place] [address]    Report Status PENDING  Incomplete  Expectorated Sputum Assessment w Gram Stain, Rflx to Resp Cult     Status: None   Collection Time: 04/24/22 11:01 PM   Specimen:  Expectorated Sputum  Result Value Ref Range Status   Specimen Description EXPECTORATED SPUTUM  Final   Special Requests Immunocompromised  Final   Sputum evaluation   Final    THIS SPECIMEN IS ACCEPTABLE FOR SPUTUM CULTURE Performed at [facility], [place] [address], [place], [place] [address]    Report Status 04/25/2022 FINAL  Final  Culture, Respiratory w Gram Stain     Status: None (Preliminary result)   Collection Time: 04/24/22 11:01 PM  Result Value Ref Range Status   Specimen Description   Final    EXPECTORATED SPUTUM Performed at [place] [address] [place], [place] [address]    Special Requests   Final    Immunocompromised Reflexed from [id number] Performed at [facility], [place] [address], [place], [place] [address]    Gram Stain   Final    RARE WBC PRESENT, PREDOMINANTLY PMN RARE GRAM NEGATIVE RODS    Culture   Final    RARE GRAM NEGATIVE RODS CULTURE REINCUBATED FOR BETTER GROWTH Performed at [facility], [place] [address], [place], [place] [address]    Report Status PENDING  Incomplete     Labs: BNP (last 3 results) No results for input(s): "BNP" in the last 8760 hours. Basic Metabolic Panel: Recent Labs  Lab 04/21/22 0755 04/24/22 1752 04/24/22 2301 04/25/22 0906 04/26/22 0433  NA 137 128* 131* 136 138  K 4.7 4.4 4.6 4.0 3.9  CL 106 98 103 110 110  CO2 25 20* 19* 21* 20*  GLUCOSE 80 129* 114* 99 90  BUN '9 17 21 19 10  '$ CREATININE 1.04* 1.98* 2.02* 1.23* 0.86  CALCIUM 9.6 8.6* 8.3* 8.6* 8.5*  MG  --   --  1.1* 2.7*  --   PHOS  --   --  2.3* 2.7  --    Liver Function Tests: Recent Labs  Lab 04/21/22 0755 04/24/22 1752 04/24/22 2301 04/25/22 0906 04/26/22 0433  AST 17 268* 169* 114* 50*  ALT 14 244* 189* 166* 102*  ALKPHOS 96 161* 143* 158* 147*  BILITOT 0.4 0.7 0.5 0.6 0.4  PROT 6.4* 6.4* 5.7* 5.9* 5.6*  ALBUMIN 3.9 3.2* 2.9* 2.8* 2.5*   No results for input(s): "LIPASE",  "AMYLASE" in the last 168 hours. No results for input(s): "AMMONIA" in the last 168 hours. CBC: Recent Labs  Lab 04/21/22  1761 04/24/22 1752 04/24/22 2301 04/25/22 0906 04/26/22 0433 04/27/22 0404  WBC 2.8* 2.5* 2.2* 3.8* 5.8 5.3  NEUTROABS 1.8 1.9 1.7 3.0  --   --   HGB 8.9* 7.8* 6.9* 9.1* 8.5* 8.3*  HCT 26.1* 23.4* 20.8* 27.3* 25.8* 24.7*  MCV 102.8* 105.4* 104.0* 100.0 100.4* 100.4*  PLT 176 153 150 160 149* 144*   Cardiac Enzymes: No results for input(s): "CKTOTAL", "CKMB", "CKMBINDEX", "TROPONINI" in the last 168 hours. BNP: Invalid input(s): "POCBNP" CBG: Recent Labs  Lab 04/26/22 0755 04/26/22 1334 04/26/22 1738 04/26/22 2155 04/27/22 0755  GLUCAP 85 97 104* 111* 87   D-Dimer Recent Labs    04/24/22 1752  DDIMER 1.03*   Hgb A1c Recent Labs    04/24/22 2303  HGBA1C 5.7*   Lipid Profile No results for input(s): "CHOL", "HDL", "LDLCALC", "TRIG", "CHOLHDL", "LDLDIRECT" in the last 72 hours. Thyroid function studies No results for input(s): "TSH", "T4TOTAL", "T3FREE", "THYROIDAB" in the last 72 hours.  Invalid input(s): "FREET3" Anemia work up No results for input(s): "VITAMINB12", "FOLATE", "FERRITIN", "TIBC", "IRON", "RETICCTPCT" in the last 72 hours. Urinalysis    Component Value Date/Time   COLORURINE YELLOW 04/24/2022 [place] 04/24/2022 0254   LABSPEC 1.011 04/24/2022 0254   PHURINE 5.0 04/24/2022 0254   GLUCOSEU NEGATIVE 04/24/2022 0254   HGBUR NEGATIVE 04/24/2022 0254   BILIRUBINUR NEGATIVE 04/24/2022 0254   KETONESUR NEGATIVE 04/24/2022 0254   PROTEINUR NEGATIVE 04/24/2022 0254   NITRITE NEGATIVE 04/24/2022 0254   LEUKOCYTESUR NEGATIVE 04/24/2022 0254   Sepsis Labs Recent Labs  Lab 04/24/22 2301 04/25/22 0906 04/26/22 0433 04/27/22 0404  WBC 2.2* 3.8* 5.8 5.3   Microbiology Recent Results (from the past 240 hour(s))  Resp panel by RT-PCR (RSV, Flu A&B, Covid) Anterior Nasal Swab     Status: None   Collection  Time: 04/24/22  5:05 PM   Specimen: Anterior Nasal Swab  Result Value Ref Range Status   SARS Coronavirus 2 by RT PCR NEGATIVE NEGATIVE Final    Comment: (NOTE) SARS-CoV-2 target nucleic acids are NOT DETECTED.  The SARS-CoV-2 RNA is generally detectable in upper respiratory specimens during the acute phase of infection. The lowest concentration of SARS-CoV-2 viral copies this assay can detect is 138 copies/mL. A negative result does not preclude SARS-Cov-2 infection and should not be used as the sole basis for treatment or other patient management decisions. A negative result may occur with  improper specimen collection/handling, submission of specimen other than nasopharyngeal swab, presence of viral mutation(s) within the areas targeted by this assay, and inadequate number of viral copies(<138 copies/mL). A negative result must be combined with clinical observations, patient history, and epidemiological information. The expected result is Negative.  Fact Sheet for Patients:  EntrepreneurPulse.com.au  Fact Sheet for Healthcare Providers:  IncredibleEmployment.be  This test is no t yet approved or cleared by the [place] FDA and  has been authorized for detection and/or diagnosis of SARS-CoV-2 by FDA under an Emergency Use Authorization (EUA). This EUA will remain  in effect (meaning this test can be used) for the duration of the COVID-19 declaration under Section 564(b)(1) of the Act, 21 U.S.C.section 360bbb-3(b)(1), unless the authorization is terminated  or revoked sooner.       Influenza A by PCR NEGATIVE NEGATIVE Final   Influenza B by PCR NEGATIVE NEGATIVE Final    Comment: (NOTE) The Xpert Xpress SARS-CoV-2/FLU/RSV plus assay is intended as an aid in the diagnosis of influenza from Nasopharyngeal swab specimens  and should not be used as a sole basis for treatment. Nasal washings and aspirates are unacceptable for Xpert Xpress  SARS-CoV-2/FLU/RSV testing.  Fact Sheet for Patients: EntrepreneurPulse.com.au  Fact Sheet for Healthcare Providers: IncredibleEmployment.be  This test is not yet approved or cleared by the [place] FDA and has been authorized for detection and/or diagnosis of SARS-CoV-2 by FDA under an Emergency Use Authorization (EUA). This EUA will remain in effect (meaning this test can be used) for the duration of the COVID-19 declaration under Section 564(b)(1) of the Act, 21 U.S.C. section 360bbb-3(b)(1), unless the authorization is terminated or revoked.     Resp Syncytial Virus by PCR NEGATIVE NEGATIVE Final    Comment: (NOTE) Fact Sheet for Patients: EntrepreneurPulse.com.au  Fact Sheet for Healthcare Providers: IncredibleEmployment.be  This test is not yet approved or cleared by the [place] FDA and has been authorized for detection and/or diagnosis of SARS-CoV-2 by FDA under an Emergency Use Authorization (EUA). This EUA will remain in effect (meaning this test can be used) for the duration of the COVID-19 declaration under Section 564(b)(1) of the Act, 21 U.S.C. section 360bbb-3(b)(1), unless the authorization is terminated or revoked.  Performed at [facility], [place] [address] [place], [place] [address]   Blood Culture (routine x 2)     Status: None (Preliminary result)   Collection Time: 04/24/22  5:38 PM   Specimen: BLOOD  Result Value Ref Range Status   Specimen Description   Final    BLOOD RIGHT ANTECUBITAL Performed at [place] [address], [place], [place] [address]    Special Requests   Final    BOTTLES DRAWN AEROBIC AND ANAEROBIC Blood Culture results may not be optimal due to an excessive volume of blood received in culture bottles Performed at [place] [address] [place], [place] [address]    Culture   Final     NO GROWTH 3 DAYS Performed at [facility], [place] [address], [place], [place] [address]    Report Status PENDING  Incomplete  Blood Culture (routine x 2)     Status: None (Preliminary result)   Collection Time: 04/24/22  5:52 PM   Specimen: BLOOD  Result Value Ref Range Status   Specimen Description   Final    BLOOD LEFT ANTECUBITAL Performed at [place] [address], [place], [place] [address]    Special Requests   Final    BOTTLES DRAWN AEROBIC AND ANAEROBIC Blood Culture results may not be optimal due to an excessive volume of blood received in culture bottles Performed at [place] [address], [place], [place] [address]    Culture   Final    NO GROWTH 3 DAYS Performed at [facility], [place] [address], [place], [place] [address]    Report Status PENDING  Incomplete  Expectorated Sputum Assessment w Gram Stain, Rflx to Resp Cult     Status: None   Collection Time: 04/24/22 11:01 PM   Specimen: Expectorated Sputum  Result Value Ref Range Status   Specimen Description EXPECTORATED SPUTUM  Final   Special Requests Immunocompromised  Final   Sputum evaluation   Final    THIS SPECIMEN IS ACCEPTABLE FOR SPUTUM CULTURE Performed at [facility], [place] [address], [place],  [address]    Report Status 04/25/2022 FINAL  Final  Culture, Respiratory w Gram Stain     Status: None (Preliminary result)  Collection Time: 04/24/22 11:01 PM  Result Value Ref Range Status   Specimen Description   Final    EXPECTORATED SPUTUM Performed at [facility] [address] [place], [place] [address]    Special Requests   Final    Immunocompromised Reflexed from [id number] Performed at [facility], [place] [address], [place], [place] [address]    Gram Stain   Final    RARE WBC PRESENT, PREDOMINANTLY PMN RARE GRAM NEGATIVE RODS    Culture   Final    RARE GRAM NEGATIVE  RODS CULTURE REINCUBATED FOR BETTER GROWTH Performed at [facility], [place] [address], [place], [place] [address]    Report Status PENDING  Incomplete    Please note: You were cared for by a hospitalist during your hospital stay. Once you are discharged, your primary care physician will handle any further medical issues. Please note that NO REFILLS for any discharge medications will be authorized once you are discharged, as it is imperative that you return to your primary care physician (or establish a relationship with a primary care physician if you do not have one) for your post hospital discharge needs so that they can reassess your need for medications and monitor your lab values.    Time coordinating discharge: 40 minutes  SIGNED:   Shelly Coss, MD  [facility] 04/27/2022, 10:46 AM Pager [phone]  If 7PM-7AM, please contact night-coverage [email or web address] Password [email or web address]

## 2022-04-28 ENCOUNTER — Telehealth: Payer: Self-pay

## 2022-04-28 ENCOUNTER — Encounter: Payer: Self-pay | Admitting: Oncology

## 2022-04-28 LAB — CULTURE, RESPIRATORY W GRAM STAIN

## 2022-04-28 NOTE — Telephone Encounter (Signed)
Transition Care Management Follow-up Telephone Call Date of discharge and from where: [name] 04/27/2022 How have you been since you were released from the hospital? better Any questions or concerns? No  Items Reviewed: Did the pt receive and understand the discharge instructions provided? Yes  Medications obtained and verified? Yes  Other? No  Any new allergies since your discharge? Yes  Dietary orders reviewed? Yes Do you have support at home? Yes   Home Care and Equipment/Supplies: Were home health services ordered? no If so, what is the name of the agency? N/a  Has the agency set up a time to come to the patient's home? not applicable Were any new equipment or medical supplies ordered?  No What is the name of the medical supply agency? N/a Were you able to get the supplies/equipment? not applicable Do you have any questions related to the use of the equipment or supplies? No  Functional Questionnaire: (I = Independent and D = Dependent) ADLs: I  Bathing/Dressing- I  Meal Prep- I  Eating- I  Maintaining continence- I  Transferring/Ambulation- I  Managing Meds- I  Follow up appointments reviewed:  PCP Hospital f/u appt confirmed? Yes  Scheduled to see Dinah Ngetich on 05/02/2022 @ 2:40. [facility] f/u appt confirmed? No   Are transportation arrangements needed? No  If their condition worsens, is the pt aware to call PCP or go to the Emergency Dept.? Yes Was the patient provided with contact information for the PCP's office or ED? Yes Was to pt encouraged to call back with questions or concerns? Yes Juanda Crumble, LPN [facility] Direct Dial [phone]

## 2022-04-29 ENCOUNTER — Encounter: Payer: Self-pay | Admitting: Oncology

## 2022-04-29 LAB — CULTURE, BLOOD (ROUTINE X 2)
Culture: NO GROWTH
Culture: NO GROWTH

## 2022-05-02 ENCOUNTER — Ambulatory Visit (INDEPENDENT_AMBULATORY_CARE_PROVIDER_SITE_OTHER): Payer: Self-pay | Admitting: Family

## 2022-05-02 ENCOUNTER — Encounter: Payer: Self-pay | Admitting: Oncology

## 2022-05-02 ENCOUNTER — Encounter: Payer: Self-pay | Admitting: Family

## 2022-05-02 VITALS — BP 118/70 | HR 103 | Temp 97.6°F | Resp 16 | Ht 66.0 in | Wt 202.0 lb

## 2022-05-02 DIAGNOSIS — E785 Hyperlipidemia, unspecified: Secondary | ICD-10-CM

## 2022-05-02 DIAGNOSIS — R042 Hemoptysis: Secondary | ICD-10-CM

## 2022-05-02 DIAGNOSIS — E871 Hypo-osmolality and hyponatremia: Secondary | ICD-10-CM

## 2022-05-02 DIAGNOSIS — R748 Abnormal levels of other serum enzymes: Secondary | ICD-10-CM

## 2022-05-02 DIAGNOSIS — I10 Essential (primary) hypertension: Secondary | ICD-10-CM

## 2022-05-02 NOTE — Patient Instructions (Signed)
-   continue with Mucinex for cough  - Notify provider if no improvement or worsen

## 2022-05-02 NOTE — Progress Notes (Signed)
Provider: Marlowe Sax FNP-C  Hobart Marte, Nelda Bucks, NP  Patient Care Team: Mariajose Mow, Nelda Bucks, NP as PCP - General (Family Medicine) Mauro Kaufmann, RN as Oncology Nurse Navigator Rockwell Germany, RN as Oncology Nurse Navigator Erroll Luna, MD as Consulting Physician (General Surgery) Eppie Gibson, MD as Attending Physician (Radiation Oncology) Benay Pike, MD as Medical Oncologist (Hematology and Oncology) Raina Mina, RPH-CPP as Pharmacist (Hematology and Oncology)  Extended Emergency Contact Information Primary Emergency Contact: [place], [place], [place] [address] Johnnette Litter of [place] Phone: [phone] Mobile Phone: [phone] Relation: Daughter  Code Status:  Full Code  Goals of care: Advanced Directive information    05/02/2022    2:58 PM  Advanced Directives  Does Patient Have a Medical Advance Directive? No  Would patient like information on creating a medical advance directive? No - Patient declined     Chief Complaint  Patient presents with   Transitions Of Care    TOC 04/24/2022-04/27/2022.    HPI:  Pt is a 66 y.o. female seen today for an acute visit for transition of care post hospitalization from 04/24/2022 - 04/27/2022 after he presented with congestion and weakness.chest X-ray showed right lower lobe pneumonia.Lab work showed CR 2,Phos 2.3,mg 1.1,hgb 6.9 with elevated liver enzymes.He was treated with Rocephin and azithromycin.Blood cultures were negative.Also had blood tinged sputum.Sputum Cx showed gram negative rods.His condition improved and was discharged on 4 days of levofloxacin. Elevated liver enzymes thought possible due to shock liver since she presented as hypotensive. She was transfused with 2 units of PRBC for Hgb 6.9 pancytopenia thought due to malignancy follows up with Oncologist for right breast cancer.s/p neoadjuvant chemotherapy,right lumpectomy,radiation treatment and on anastrozole.she was also on  abemaciclib which was discontinued prior to discharge home.  He states doing much better since he was discharged home.No acute issues this visit.   Past Medical History:  Diagnosis Date   Arthritis    Breast cancer ([place])    Cancer ([place])    breast   Diabetes mellitus without complication ([place])    History of MRI    Knee, Hip, and Back.   Hypertension    Personal history of chemotherapy    Personal history of radiation therapy    Past Surgical History:  Procedure Laterality Date   BREAST BIOPSY Right 02/03/2020   x2   BREAST BIOPSY Right 02/06/2020   x2   BREAST LUMPECTOMY Right 07/26/2020   BREAST LUMPECTOMY WITH RADIOACTIVE SEED AND SENTINEL LYMPH NODE BIOPSY Right 07/26/2020   Procedure: RIGHT BREAST LUMPECTOMY WITH RADIOACTIVE SEED X 2 AND SENTINEL LYMPH NODE BIOPSY;  Surgeon: Erroll Luna, MD;  Location: [facility];  Service: General;  Laterality: Right;   IR IMAGING GUIDED PORT INSERTION  02/23/2020   PORT-A-CATH REMOVAL N/A 07/26/2020   Procedure: REMOVAL PORT-A-CATH;  Surgeon: Erroll Luna, MD;  Location: [facility];  Service: General;  Laterality: N/A;   TOTAL KNEE ARTHROPLASTY Left 11/18/2021   Procedure: LEFT TOTAL KNEE ARTHROPLASTY;  Surgeon: Leandrew Koyanagi, MD;  Location: [facility];  Service: Orthopedics;  Laterality: Left;    Allergies  Allergen Reactions   Sulfa Antibiotics Itching    Outpatient Encounter Medications as of 05/02/2022  Medication Sig   anastrozole (ARIMIDEX) 1 MG tablet Take 1 tablet (1 mg total) by mouth daily.   atorvastatin (LIPITOR) 40 MG tablet Take 1 tablet (40 mg total) by mouth daily.   calcium-vitamin D (  OSCAL WITH D) 500-5 MG-MCG tablet Take 1 tablet by mouth daily. Will bring in dosing to next visit   carvedilol (COREG) 6.25 MG tablet Take 1 tablet (6.25 mg total) by mouth 2 (two) times daily with a meal.   diclofenac (VOLTAREN) 75 MG EC tablet Take 1 tablet (75 mg total) by mouth 2 (two) times daily.    lansoprazole (PREVACID) 30 MG capsule Take 1 capsule (30 mg total) by mouth every morning.   Magnesium 100 MG CAPS Take 100 mg by mouth daily.   metFORMIN (GLUCOPHAGE) 1000 MG tablet Take 1 tablet (1,000 mg total) by mouth 2 (two) times daily with a meal.   Multiple Vitamins-Minerals (CENTRUM SILVER 50+WOMEN PO) Take 1 capsule by mouth daily.   UNABLE TO FIND Med Name: [place]. Pt reports taking 1 scoopful in tea or water   valsartan (DIOVAN) 320 MG tablet Take 1 tablet (320 mg total) by mouth daily.   vitamin C (ASCORBIC ACID) 500 MG tablet Take 500 mg by mouth daily.   [DISCONTINUED] chlorpheniramine-HYDROcodone (TUSSIONEX) 10-8 MG/5ML Take 5 mLs by mouth every 12 (twelve) hours for 5 days.   [DISCONTINUED] guaiFENesin (MUCINEX) 600 MG 12 hr tablet Take 1 tablet (600 mg total) by mouth 2 (two) times daily for 7 days.   No facility-administered encounter medications on file as of 05/02/2022.    Review of Systems  Constitutional:  Negative for appetite change, chills, fatigue, fever and unexpected weight change.  HENT:  Negative for congestion, dental problem, ear discharge, ear pain, facial swelling, hearing loss, nosebleeds, postnasal drip, rhinorrhea, sinus pressure, sinus pain, sneezing, sore throat, tinnitus and trouble swallowing.   Eyes:  Negative for pain, discharge, redness, itching and visual disturbance.  Respiratory:  Positive for cough. Negative for chest tightness, shortness of breath and wheezing.   Cardiovascular:  Negative for chest pain, palpitations and leg swelling.  Gastrointestinal:  Negative for abdominal distention, abdominal pain, blood in stool, constipation, diarrhea, nausea and vomiting.  Endocrine: Negative for cold intolerance, heat intolerance, polydipsia, polyphagia and polyuria.  Genitourinary:  Negative for difficulty urinating, dysuria, flank pain, frequency and urgency.  Musculoskeletal:  Positive for arthralgias and gait problem. Negative for back pain,  joint swelling, myalgias, neck pain and neck stiffness.  Skin:  Negative for color change, pallor, rash and wound.  Neurological:  Negative for dizziness, syncope, speech difficulty, weakness, light-headedness, numbness and headaches.  Hematological:  Does not bruise/bleed easily.  Psychiatric/Behavioral:  Negative for agitation, behavioral problems, confusion, hallucinations, self-injury, sleep disturbance and suicidal ideas. The patient is not nervous/anxious.     Immunization History  Administered Date(s) Administered   DTaP 09/04/2017, 10/05/2017   Hepatitis B 09/04/2017, 10/05/2017   Influenza,inj,Quad PF,6+ Mos 04/26/2022   Influenza-Unspecified 09/04/2017, 04/19/2019   MMR 09/04/2017   Tdap 04/19/2019   Unspecified SARS-COV-2 Vaccination 09/13/2019, 11/06/2019   Varicella 09/04/2017, 06/30/2018   Pertinent  Health Maintenance Due  Topic Date Due   COLONOSCOPY (Pts 45-90yr Insurance coverage will need to be confirmed)  Never done   PAP SMEAR-Modifier  02/08/2024   MAMMOGRAM  03/27/2024   INFLUENZA VACCINE  Completed      04/25/2022    7:58 PM 04/26/2022    7:58 AM 04/26/2022   10:17 PM 04/27/2022    8:01 AM 05/02/2022    2:58 PM  Fall Risk  Falls in the past year?     0  Was there an injury with Fall?     0  Fall Risk Category Calculator  0  Fall Risk Category     Low  Patient Fall Risk Level Moderate fall risk Moderate fall risk Low fall risk Low fall risk Low fall risk  Patient at Risk for Falls Due to     No Fall Risks  Fall risk Follow up     Falls evaluation completed   Functional Status Survey:    Vitals:   05/02/22 1450  BP: 118/70  Pulse: (!) 103  Resp: 16  Temp: 97.6 F (36.4 C)  SpO2: 98%  Weight: 202 lb (91.6 kg)  Height: _0  (1.676 m)   Body mass index is 32.6 kg/m. Physical Exam Vitals reviewed.  Constitutional:      General: She is not in acute distress.    Appearance: Normal appearance. She is obese. She is not ill-appearing or  diaphoretic.  HENT:     Head: Normocephalic.     Right Ear: Tympanic membrane, ear canal and external ear normal. There is no impacted cerumen.     Left Ear: Tympanic membrane, ear canal and external ear normal. There is no impacted cerumen.     Nose: Nose normal. No congestion or rhinorrhea.     Mouth/Throat:     Mouth: Mucous membranes are moist.     Pharynx: Oropharynx is clear. No oropharyngeal exudate or posterior oropharyngeal erythema.  Eyes:     General: No scleral icterus.       Right eye: No discharge.        Left eye: No discharge.     Extraocular Movements: Extraocular movements intact.     Conjunctiva/sclera: Conjunctivae normal.     Pupils: Pupils are equal, round, and reactive to light.  Neck:     Vascular: No carotid bruit.  Cardiovascular:     Rate and Rhythm: Normal rate and regular rhythm.     Pulses: Normal pulses.     Heart sounds: Normal heart sounds. No murmur heard.    No friction rub. No gallop.  Pulmonary:     Effort: Pulmonary effort is normal. No respiratory distress.     Breath sounds: Normal breath sounds. No wheezing, rhonchi or rales.  Chest:     Chest wall: No tenderness.  Abdominal:     General: Bowel sounds are normal. There is no distension.     Palpations: Abdomen is soft. There is no mass.     Tenderness: There is no abdominal tenderness. There is no right CVA tenderness, left CVA tenderness, guarding or rebound.  Musculoskeletal:        General: No swelling or tenderness. Normal range of motion.     Cervical back: Normal range of motion. No rigidity or tenderness.     Right lower leg: No edema.     Left lower leg: No edema.  Lymphadenopathy:     Cervical: No cervical adenopathy.  Skin:    General: Skin is warm and dry.     Coloration: Skin is not pale.     Findings: No bruising, erythema, lesion or rash.  Neurological:     Mental Status: She is alert and oriented to person, place, and time.     Cranial Nerves: No cranial nerve  deficit.     Sensory: No sensory deficit.     Motor: No weakness.     Coordination: Coordination normal.     Gait: Gait abnormal.  Psychiatric:        Mood and Affect: Mood normal.        Speech: Speech normal.  Behavior: Behavior normal.        Thought Content: Thought content normal.        Judgment: Judgment normal.    Labs reviewed: Recent Labs    04/24/22 2301 04/25/22 0906 04/26/22 0433  NA 131* 136 138  K 4.6 4.0 3.9  CL 103 110 110  CO2 19* 21* 20*  GLUCOSE 114* 99 90  BUN _0 CREATININE 2.02* 1.23* 0.86  CALCIUM 8.3* 8.6* 8.5*  MG 1.1* 2.7*  --   PHOS 2.3* 2.7  --    Recent Labs    04/24/22 2301 04/25/22 0906 04/26/22 0433  AST 169* 114* 50*  ALT 189* 166* 102*  ALKPHOS 143* 158* 147*  BILITOT 0.5 0.6 0.4  PROT 5.7* 5.9* 5.6*  ALBUMIN 2.9* 2.8* 2.5*   Recent Labs    04/24/22 1752 04/24/22 2301 04/25/22 0906 04/26/22 0433 04/27/22 0404  WBC 2.5* 2.2* 3.8* 5.8 5.3  NEUTROABS 1.9 1.7 3.0  --   --   HGB 7.8* 6.9* 9.1* 8.5* 8.3*  HCT 23.4* 20.8* 27.3* 25.8* 24.7*  MCV 105.4* 104.0* 100.0 100.4* 100.4*  PLT 153 150 160 149* 144*   Lab Results  Component Value Date   TSH 2.38 08/14/2021   Lab Results  Component Value Date   HGBA1C 5.7 (H) 04/24/2022   Lab Results  Component Value Date   CHOL 120 01/10/2022   HDL 62 01/10/2022   LDLCALC 40 01/10/2022   TRIG 94 01/10/2022   CHOLHDL 1.9 01/10/2022    Significant Diagnostic Results in last 30 days:  CT CHEST W CONTRAST  Result Date: 04/26/2022 CLINICAL DATA:  Hemoptysis. EXAM: CT CHEST WITH CONTRAST TECHNIQUE: Multidetector CT imaging of the chest was performed during intravenous contrast administration. RADIATION DOSE REDUCTION: This exam was performed according to the departmental dose-optimization program which includes automated exposure control, adjustment of the mA and/or kV according to patient size and/or use of iterative reconstruction technique. CONTRAST:  53m OMNIPAQUE  IOHEXOL 300 MG/ML  SOLN COMPARISON:  CT chest 04/23/2020 FINDINGS: Cardiovascular: No significant vascular findings. Mild aortic and coronary artery calcifications. Normal heart size. No pericardial effusion. Mediastinum/Nodes: No enlarged mediastinal, hilar, or axillary lymph nodes. Thyroid gland, trachea, and esophagus demonstrate no significant findings. Lungs/Pleura: Ground-glass opacities with associated scattered small patchy consolidations in the inferior right lower lobe. Additional small ground-glass opacity noted in the left lower lobe. Subpleural reticulation in the anterolateral aspect of the right upper lobe. Subsegmental atelectasis in the lingula. No pleural effusion or pneumothorax. Upper Abdomen: No acute abnormality. Musculoskeletal: Age-indeterminate superior endplate compression fracture of the T4 vertebral body with approximately 25% vertebral body height loss, new compared to 04/23/2020 (series 6, image 125). Subtle 1 cm sclerotic lesion in the left posterior aspect of the T1 vertebral body is unchanged compared to 04/23/2020 (series 6, image 127; series 2, image 18). IMPRESSION: 1. Ground-glass opacities with associated scattered small patchy consolidations in the inferior aspect of the right lower lobe are favored to reflect pulmonary hemorrhage given clinical history of hemoptysis. No discrete underlying mass identified. 2. Small ground-glass opacity in the left lower lobe could reflect additional localized hemorrhage versus infectious versus inflammatory process. 3. Age-indeterminate superior endplate compression fracture of the T4 vertebral body. If clinically desired, further assessment regarding the acuity of the compression fracture could be performed with MRI of the thoracic spine. 4. Aortic and coronary artery atherosclerosis. Aortic Atherosclerosis (ICD10-I70.0). Electronically Signed   By: LIleana RoupM.D.   On: 04/26/2022  18:19   DG Chest Port 1 View  Result Date:  04/24/2022 CLINICAL DATA:  Questionable sepsis - evaluate for abnormality EXAM: PORTABLE CHEST 1 VIEW COMPARISON:  CT chest 04/23/2020 FINDINGS: Prominent cardiac silhouette. Finding may be due to AP portable technique. Otherwise the heart and mediastinal contours are within normal limits. Right lower lobe patchy airspace and interstitial opacities. Question similar findings to a lesser extent within the left lower lobe. No pulmonary edema. No pleural effusion. No pneumothorax. No acute osseous abnormality. Surgical clips overlie the right axilla. IMPRESSION: Right lower lobe patchy airspace and interstitial opacities. Question similar findings to a lesser extent within the left lower lobe. Findings suggestive of infection/inflammation. Followup PA and lateral chest X-ray is recommended in 3-4 weeks following therapy to ensure resolution and exclude underlying malignancy. Electronically Signed   By: Iven Finn M.D.   On: 04/24/2022 17:30    Assessment/Plan  1. Essential hypertension B/p stable  - continue on valsartan and coreg  - CBC with Differential/Platelets - CMP with eGFR([facility])  2. Hyponatremia Low level on recent hospital admission.will recheck lab work  - CBC with Differential/Platelets - CMP with eGFR([facility])  3. Hyperlipidemia LDL goal <100 LDL at goal  - continue on dietary modification - continue on atorvastatin   4. Hemoptysis Resolved.Had blood tinge sputum during hospital admission thought due to PNA.sputum cultures showed gram negative rods.   5. Elevated liver enzymes High during admission thought due to liver shock secondary to hypotension on admission. - CMP with eGFR([facility])  Family/ staff Communication: Reviewed plan of care with patient verbalized understanding  Labs/tests ordered:  - CBC with Differential/Platelets - CMP with eGFR([facility])  Next Appointment: Return if symptoms worsen or fail to improve.   Sandrea Hughs, NP

## 2022-05-03 LAB — COMPLETE METABOLIC PANEL WITH GFR
AG Ratio: 1.5 (calc) (ref 1.0–2.5)
ALT: 21 U/L (ref 6–29)
AST: 16 U/L (ref 10–35)
Albumin: 3.7 g/dL (ref 3.6–5.1)
Alkaline phosphatase (APISO): 146 U/L (ref 37–153)
BUN: 9 mg/dL (ref 7–25)
CO2: 28 mmol/L (ref 20–32)
Calcium: 8.7 mg/dL (ref 8.6–10.4)
Chloride: 103 mmol/L (ref 98–110)
Creat: 0.81 mg/dL (ref 0.50–1.05)
Globulin: 2.5 g/dL (calc) (ref 1.9–3.7)
Glucose, Bld: 92 mg/dL (ref 65–99)
Potassium: 4.3 mmol/L (ref 3.5–5.3)
Sodium: 139 mmol/L (ref 135–146)
Total Bilirubin: 0.3 mg/dL (ref 0.2–1.2)
Total Protein: 6.2 g/dL (ref 6.1–8.1)
eGFR: 81 mL/min/{1.73_m2} (ref >=60)

## 2022-05-03 LAB — CBC WITH DIFFERENTIAL/PLATELET
Absolute Monocytes: 310 cells/uL (ref 200–950)
Basophils Absolute: 39 cells/uL (ref 0–200)
Basophils Relative: 0.9 %
Eosinophils Absolute: 52 cells/uL (ref 15–500)
Eosinophils Relative: 1.2 %
HCT: 28.5 % — ABNORMAL LOW (ref 35.0–45.0)
Hemoglobin: 9.7 g/dL — ABNORMAL LOW (ref 11.7–15.5)
Lymphs Abs: 864 cells/uL (ref 850–3900)
MCH: 33.2 pg — ABNORMAL HIGH (ref 27.0–33.0)
MCHC: 34 g/dL (ref 32.0–36.0)
MCV: 97.6 fL (ref 80.0–100.0)
MPV: 10.3 fL (ref 7.5–12.5)
Monocytes Relative: 7.2 %
Neutro Abs: 3036 cells/uL (ref 1500–7800)
Neutrophils Relative %: 70.6 %
Platelets: 200 10*3/uL (ref 140–400)
RBC: 2.92 10*6/uL — ABNORMAL LOW (ref 3.80–5.10)
RDW: 13.2 % (ref 11.0–15.0)
Total Lymphocyte: 20.1 %
WBC: 4.3 10*3/uL (ref 3.8–10.8)

## 2022-05-06 ENCOUNTER — Other Ambulatory Visit: Payer: Self-pay | Admitting: Hematology and Oncology

## 2022-05-06 DIAGNOSIS — Z170 Estrogen receptor positive status [ER+]: Secondary | ICD-10-CM

## 2022-05-13 ENCOUNTER — Ambulatory Visit (INDEPENDENT_AMBULATORY_CARE_PROVIDER_SITE_OTHER): Payer: Self-pay | Admitting: Physician Assistant

## 2022-05-13 ENCOUNTER — Encounter: Payer: Self-pay | Admitting: Oncology

## 2022-05-13 ENCOUNTER — Ambulatory Visit (INDEPENDENT_AMBULATORY_CARE_PROVIDER_SITE_OTHER): Payer: Medicaid Other

## 2022-05-13 ENCOUNTER — Other Ambulatory Visit (HOSPITAL_COMMUNITY): Payer: Self-pay

## 2022-05-13 DIAGNOSIS — Z96652 Presence of left artificial knee joint: Secondary | ICD-10-CM

## 2022-05-13 NOTE — Progress Notes (Signed)
Post-Op Visit Note   [name]           Date of Birth: [birth year]           MRN: [id number] Visit Date: 05/13/2022 PCP: Sandrea Hughs, NP   Assessment & Plan:  Chief Complaint:  Chief Complaint  Patient presents with   Left Knee - Routine Post Op   Visit Diagnoses:  1. Status post total left knee replacement     Plan: Patient is a pleasant 67 year old female who comes in today 6 months status post left total knee replacement 11/18/2021.  She has been doing relatively well in regards to the knee.  She was recently hospitalized for sepsis due to pneumonia.  She has finished outpatient physical therapy and continues to work on a home exercise program.  She notes occasional discomfort with stair climbing which is relieved with diclofenac.  Examination left knee reveals range of motion from 0 to 110 degrees.  She is stable to valgus varus stress.  She is neurovascular intact distally.  At this point, she will continue to advance with activity as tolerated.  Dental prophylaxis reinforced.  Follow-up in 6 months for repeat evaluation and 2 view x-rays of the left knee.  Call with concerns or questions in the meantime.  Follow-Up Instructions: Return in about 6 months (around 11/11/2022).   Orders:  Orders Placed This Encounter  Procedures   XR KNEE 3 VIEW LEFT   No orders of the defined types were placed in this encounter.   Imaging: XR KNEE 3 VIEW LEFT  Result Date: 05/13/2022 Well-seated prosthesis without complication   PMFS History: Patient Active Problem List   Diagnosis Date Noted   Sepsis ([facility]) 04/24/2022   Primary osteoarthritis of right knee 02/11/2022   Status post total left knee replacement 11/18/2021   Primary osteoarthritis of left knee 08/06/2021   Aortic atherosclerosis ([facility]) 04/24/2020   Deep venous thrombosis of left upper extremity ([facility]) 04/24/2020   Port-A-Cath in place 03/26/2020   Malignant neoplasm of upper-outer quadrant of right breast in  female, estrogen receptor positive ([facility]) 02/13/2020   Past Medical History:  Diagnosis Date   Arthritis    Breast cancer ([facility])    Cancer ([facility])    breast   Diabetes mellitus without complication ([facility])    History of MRI    Knee, Hip, and Back.   Hypertension    Personal history of chemotherapy    Personal history of radiation therapy     Family History  Problem Relation Age of Onset   Asthma Mother    Arthritis Sister    Asthma Brother    Arthritis Brother     Past Surgical History:  Procedure Laterality Date   BREAST BIOPSY Right 02/03/2020   x2   BREAST BIOPSY Right 02/06/2020   x2   BREAST LUMPECTOMY Right 07/26/2020   BREAST LUMPECTOMY WITH RADIOACTIVE SEED AND SENTINEL LYMPH NODE BIOPSY Right 07/26/2020   Procedure: RIGHT BREAST LUMPECTOMY WITH RADIOACTIVE SEED X 2 AND SENTINEL LYMPH NODE BIOPSY;  Surgeon: Erroll Luna, MD;  Location: [facility];  Service: General;  Laterality: Right;   IR IMAGING GUIDED PORT INSERTION  02/23/2020   PORT-A-CATH REMOVAL N/A 07/26/2020   Procedure: REMOVAL PORT-A-CATH;  Surgeon: Erroll Luna, MD;  Location: [facility];  Service: General;  Laterality: N/A;   TOTAL KNEE ARTHROPLASTY Left 11/18/2021   Procedure: LEFT TOTAL KNEE ARTHROPLASTY;  Surgeon: Leandrew Koyanagi, MD;  Location: [facility];  Service: Orthopedics;  Laterality: Left;   Social History   Occupational History   Not on file  Tobacco Use   Smoking status: Never   Smokeless tobacco: Never  Vaping Use   Vaping Use: Never used  Substance and Sexual Activity   Alcohol use: Yes    Alcohol/week: 1.0 standard drink of alcohol    Types: 1 Glasses of wine per week    Comment: occasionally   Drug use: Never   Sexual activity: Not Currently

## 2022-05-15 ENCOUNTER — Encounter: Payer: Self-pay | Admitting: Adult Health

## 2022-05-15 ENCOUNTER — Inpatient Hospital Stay: Payer: Medicaid Other | Attending: Hematology and Oncology | Admitting: Adult Health

## 2022-05-15 ENCOUNTER — Other Ambulatory Visit: Payer: Self-pay

## 2022-05-15 VITALS — BP 145/71 | HR 71 | Temp 97.5°F | Resp 19 | Wt 200.6 lb

## 2022-05-15 DIAGNOSIS — Z170 Estrogen receptor positive status [ER+]: Secondary | ICD-10-CM | POA: Diagnosis not present

## 2022-05-15 DIAGNOSIS — Z923 Personal history of irradiation: Secondary | ICD-10-CM | POA: Diagnosis not present

## 2022-05-15 DIAGNOSIS — Z79811 Long term (current) use of aromatase inhibitors: Secondary | ICD-10-CM | POA: Diagnosis not present

## 2022-05-15 DIAGNOSIS — C50411 Malignant neoplasm of upper-outer quadrant of right female breast: Secondary | ICD-10-CM | POA: Diagnosis present

## 2022-05-15 DIAGNOSIS — J189 Pneumonia, unspecified organism: Secondary | ICD-10-CM | POA: Diagnosis not present

## 2022-05-15 NOTE — Progress Notes (Signed)
[name] Follow up:    [name], [name] Bucks, NP [place] [address]   DIAGNOSIS:  Cancer Staging  Malignant neoplasm of upper-outer quadrant of right breast in female, estrogen receptor positive ([place]) Staging form: Breast, AJCC 8th Edition - Clinical stage from 02/06/2020: Stage IIIA (cT2, cN1, cM0, G3, ER+, PR-, HER2-) - Signed by [name] Gibson, MD on 02/22/2020 Stage prefix: Initial diagnosis Histologic grading system: 3 grade system - Pathologic stage from 07/26/2020: No Stage Recommended (ypT1c, pN1a, cM0) - Signed by [name] Phlegm, NP on 08/08/2020 Stage prefix: Post-therapy   SUMMARY OF ONCOLOGIC HISTORY: 67 y.o. [name] [name] woman status post right breast upper outer quadrant (12:00) and axillary lymph node biopsy 02/03/2020, both positive for a clinical T1c N1, stage IIB invasive ductal carcinoma, grade 3, estrogen receptor positive, HER-2 and progesterone receptor negative, with an MIB-1 of 60%.             (a) biopsy of a second right upper outer quadrant T2 lesion (11 o'clock) 02/06/2020 also showed invasive ductal carcinoma, but grade 2, estrogen receptor positive, progesterone receptor and HER-2 negative, with an MIB-1 of 15%             (b) biopsy of a third right breast lesion (1:00) was benign              (1) neoadjuvant chemotherapy consisting of cyclophosphamide and docetaxel every 21 days x 4 starting 03/05/2020             (a) discontinued after 2 cycles because of peripheral neuropathy             (b) cyclophosphamide/doxorubicin started 04/24/2020, repeated every 21 days x4, completed 06/25/2020             (c) echo 04/17/2020 showed EF of 55-60%   (2) right lumpectomy 07/26/2020 found a residual pT1c pN1 invasive ductal carcinoma, grade 2, with negative margins             (a) repeat prognostic indicators show the tumor to be estrogen receptor positive, progesterone receptor and HER2 negative, with an MIB-1 of 2%.              (b) a total of 6 right axillary lymph nodes removed, 2 positive   (3) adjuvant radiation completed 10/17/2020   (4) anastrozole started June 2022   (5) CT scan of the chest 04/23/2020 shows a left brachiocephalicl catheter associated clot             (a) rivaroxaban started 04/24/2020, discontinued 08/22/2020             (b) port removed 07/26/2020  (6) Verzenio added at 100 mg p.o. twice daily in March 2023    CURRENT THERAPY: Anastrozole and Verzenio (Verzenio on hold currently)  INTERVAL HISTORY: [name] [name] 67 y.o. female returns for follow-up after recent hospitalization.  She was taking anastrozole daily for her history of right-sided breast cancer that she began adjuvantly in June 2022.  She later began Verzenio in March 2023.  She had tolerated it well however from December 14 to December 17 she was admitted to the hospital for pneumonia and sepsis.  Since her discharge she has continued to recover she is already seeing her primary care provider and her labs were stable at that visit.  She is here for follow-up to determine next steps on the [facility] since it was stopped when she was admitted to the hospital.  She tells me  that she continues to have some mild shortness of breath with her activity but continues to regain her strength.  She denies any fevers chills cough or other concerns.  She is taking the anastrozole daily with good tolerance.   Patient Active Problem List   Diagnosis Date Noted   Sepsis ([place]) 04/24/2022   Primary osteoarthritis of right knee 02/11/2022   Status post total left knee replacement 11/18/2021   Primary osteoarthritis of left knee 08/06/2021   Aortic atherosclerosis ([place]) 04/24/2020   Deep venous thrombosis of left upper extremity ([place]) 04/24/2020   Port-A-Cath in place 03/26/2020   Malignant neoplasm of upper-outer quadrant of right breast in female, estrogen receptor positive ([place]) 02/13/2020    is allergic to sulfa antibiotics.  MEDICAL  HISTORY: Past Medical History:  Diagnosis Date   Arthritis    Breast cancer ([place])    Cancer ([place])    breast   Diabetes mellitus without complication ([place])    History of MRI    Knee, Hip, and Back.   Hypertension    Personal history of chemotherapy    Personal history of radiation therapy     SURGICAL HISTORY: Past Surgical History:  Procedure Laterality Date   BREAST BIOPSY Right 02/03/2020   x2   BREAST BIOPSY Right 02/06/2020   x2   BREAST LUMPECTOMY Right 07/26/2020   BREAST LUMPECTOMY WITH RADIOACTIVE SEED AND SENTINEL LYMPH NODE BIOPSY Right 07/26/2020   Procedure: RIGHT BREAST LUMPECTOMY WITH RADIOACTIVE SEED X 2 AND SENTINEL LYMPH NODE BIOPSY;  Surgeon: Erroll Luna, MD;  Location: [facility];  Service: General;  Laterality: Right;   IR IMAGING GUIDED PORT INSERTION  02/23/2020   PORT-A-CATH REMOVAL N/A 07/26/2020   Procedure: REMOVAL PORT-A-CATH;  Surgeon: Erroll Luna, MD;  Location: [facility];  Service: General;  Laterality: N/A;   TOTAL KNEE ARTHROPLASTY Left 11/18/2021   Procedure: LEFT TOTAL KNEE ARTHROPLASTY;  Surgeon: Leandrew Koyanagi, MD;  Location: [facility];  Service: Orthopedics;  Laterality: Left;    SOCIAL HISTORY: Social History   Socioeconomic History   Marital status: Single    Spouse name: Not on file   Number of children: 1   Years of education: Not on file   Highest education level: 9th grade  Occupational History   Not on file  Tobacco Use   Smoking status: Never   Smokeless tobacco: Never  Vaping Use   Vaping Use: Never used  Substance and Sexual Activity   Alcohol use: Yes    Alcohol/week: 1.0 standard drink of alcohol    Types: 1 Glasses of wine per week    Comment: occasionally   Drug use: Never   Sexual activity: Not Currently  Other Topics Concern   Not on file  Social History Narrative   Tobacco use, amount per day now:   Past tobacco use, amount per day:   How many years did you use tobacco:    Alcohol use (drinks per week): 1 drink occasionally.   Diet:   Do you drink/eat things with caffeine: Yes   Marital status:  Single                                What year were you married?   Do you live in a house, apartment, assisted living, condo, trailer, etc.? House   Is it one or more stories? One   How many persons live in your  home? 5   Do you have pets in your home?( please list) Cat   Highest Level of education completed: High School.   Current or past profession: Armed forces operational officer.   Do you exercise?  Yes                                   Type and how often? 2 times weekly.   Do you have a living will? No   Do you have a DNR form?    No                               If not, do you want to discuss one?   Do you have signed POA/HPOA forms?     No                   If so, please bring to you appointment    Do you have any difficulty bathing or dressing yourself? No   Do you have difficulty preparing food or eating? No   Do you have difficulty managing your medications? No   Do you have any difficulty managing your finances? No   Do you have any difficulty affording your medications? No          Social Determinants of Health   Financial Resource Strain: Not on file  Food Insecurity: No Food Insecurity (04/25/2022)   Hunger Vital Sign    Worried About Running Out of Food in the Last Year: Never true    Ran Out of Food in the Last Year: Never true  Transportation Needs: No Transportation Needs (04/25/2022)   PRAPARE - Hydrologist (Medical): No    Lack of Transportation (Non-Medical): No  Physical Activity: Not on file  Stress: Not on file  Social Connections: Not on file  Intimate Partner Violence: Not At Risk (04/25/2022)   Humiliation, Afraid, Rape, and Kick questionnaire    Fear of Current or Ex-Partner: No    Emotionally Abused: No    Physically Abused: No    Sexually Abused: No    FAMILY HISTORY: Family History  Problem Relation  Age of Onset   Asthma Mother    Arthritis Sister    Asthma Brother    Arthritis Brother     Review of Systems  Constitutional:  Negative for appetite change, chills, fatigue, fever and unexpected weight change.  HENT:   Negative for hearing loss, lump/mass and trouble swallowing.   Eyes:  Negative for eye problems and icterus.  Respiratory:  Positive for shortness of breath (Mild as per interval history). Negative for chest tightness and cough.   Cardiovascular:  Negative for chest pain, leg swelling and palpitations.  Gastrointestinal:  Negative for abdominal distention, abdominal pain, constipation, diarrhea, nausea and vomiting.  Endocrine: Negative for hot flashes.  Genitourinary:  Negative for difficulty urinating.   Musculoskeletal:  Negative for arthralgias.  Skin:  Negative for itching and rash.  Neurological:  Negative for dizziness, extremity weakness, headaches and numbness.  Hematological:  Negative for adenopathy. Does not bruise/bleed easily.  Psychiatric/Behavioral:  Negative for depression. The patient is not nervous/anxious.       PHYSICAL EXAMINATION  ECOG PERFORMANCE STATUS: 1 - Symptomatic but completely ambulatory  Vitals:   05/15/22 1444  BP: (!) 145/71  Pulse: 71  Resp: 19  Temp: (!) 97.5  F (36.4 C)  SpO2: 100%    Physical Exam Constitutional:      General: She is not in acute distress.    Appearance: Normal appearance. She is not toxic-appearing.  HENT:     Head: Normocephalic and atraumatic.  Eyes:     General: No scleral icterus. Cardiovascular:     Rate and Rhythm: Normal rate and regular rhythm.     Pulses: Normal pulses.     Heart sounds: Normal heart sounds.  Pulmonary:     Effort: Pulmonary effort is normal.     Breath sounds: Normal breath sounds.  Abdominal:     General: Abdomen is flat. Bowel sounds are normal. There is no distension.     Palpations: Abdomen is soft.     Tenderness: There is no abdominal tenderness.   Musculoskeletal:        General: No swelling.     Cervical back: Neck supple.  Lymphadenopathy:     Cervical: No cervical adenopathy.  Skin:    General: Skin is warm and dry.     Findings: No rash.  Neurological:     General: No focal deficit present.     Mental Status: She is alert.  Psychiatric:        Mood and Affect: Mood normal.        Behavior: Behavior normal.     LABORATORY DATA:  CBC    Component Value Date/Time   WBC 4.3 05/02/2022 1535   RBC 2.92 (L) 05/02/2022 1535   HGB 9.7 (L) 05/02/2022 1535   HGB 8.9 (L) 04/21/2022 0755   HCT 28.5 (L) 05/02/2022 1535   PLT 200 05/02/2022 1535   PLT 176 04/21/2022 0755   MCV 97.6 05/02/2022 1535   MCH 33.2 (H) 05/02/2022 1535   MCHC 34.0 05/02/2022 1535   RDW 13.2 05/02/2022 1535   LYMPHSABS 864 05/02/2022 1535   MONOABS 0.3 04/25/2022 0906   EOSABS 52 05/02/2022 1535   BASOSABS 39 05/02/2022 1535    CMP     Component Value Date/Time   NA 139 05/02/2022 1535   K 4.3 05/02/2022 1535   CL 103 05/02/2022 1535   CO2 28 05/02/2022 1535   GLUCOSE 92 05/02/2022 1535   BUN 9 05/02/2022 1535   CREATININE 0.81 05/02/2022 1535   CALCIUM 8.7 05/02/2022 1535   PROT 6.2 05/02/2022 1535   ALBUMIN 2.5 (L) 04/26/2022 0433   AST 16 05/02/2022 1535   AST 17 04/21/2022 0755   ALT 21 05/02/2022 1535   ALT 14 04/21/2022 0755   ALKPHOS 147 (H) 04/26/2022 0433   BILITOT 0.3 05/02/2022 1535   BILITOT 0.4 04/21/2022 0755   GFRNONAA >60 04/26/2022 0433   GFRNONAA >60 04/21/2022 0755         ASSESSMENT and THERAPY PLAN:   Malignant neoplasm of upper-outer quadrant of right breast in female, estrogen receptor positive ([place]) [name] [name] is a 67 year old woman with history of stage IIIa ER positive breast cancer that was diagnosed in September 2021.  She is status post neoadjuvant chemotherapy, right lumpectomy, adjuvant radiation, and anastrozole beginning in June 2022 followed by [name] [name] beginning in March 2023.  [name] [name]  continues on the anastrozole with good tolerance.  She will continue this.  We discussed her recent hospitalization and her lungs which are continuing to recover.  Considering the significant infection and sepsis she experienced I recommended that she continue off the Verzenio for 4 more weeks and return in 4 weeks for labs, follow-up, and a  chest x-ray.  She will continue on anastrozole alone and I reviewed this change with Dr. Chryl Heck who is in agreement.   All questions were answered. The patient knows to call the clinic with any problems, questions or concerns. We can certainly see the patient much sooner if necessary.   Total encounter time:30 minutes*in face-to-face visit time, chart review, lab review, care coordination, order entry, and documentation of the encounter time.    Wilber Bihari, NP 05/16/22 4:00 PM Medical Oncology and Hematology [facility] [place], [place] [address] Tel. [phone]    Fax. [phone]  *Total Encounter Time as defined by the Centers for Medicare and Medicaid Services includes, in addition to the face-to-face time of a patient visit (documented in the note above) non-face-to-face time: obtaining and reviewing outside history, ordering and reviewing medications, tests or procedures, care coordination (communications with other health care professionals or caregivers) and documentation in the medical record.

## 2022-05-16 NOTE — Assessment & Plan Note (Signed)
[name] [name] is a 67 year old woman with history of stage IIIa ER positive breast cancer that was diagnosed in September 2021.  She is status post neoadjuvant chemotherapy, right lumpectomy, adjuvant radiation, and anastrozole beginning in June 2022 followed by [name] [name] beginning in March 2023.  [name] [name] continues on the anastrozole with good tolerance.  She will continue this.  We discussed her recent hospitalization and her lungs which are continuing to recover.  Considering the significant infection and sepsis she experienced I recommended that she continue off the Verzenio for 4 more weeks and return in 4 weeks for labs, follow-up, and a chest x-ray.  She will continue on anastrozole alone and I reviewed this change with Dr. Chryl Heck who is in agreement.

## 2022-05-19 ENCOUNTER — Telehealth: Payer: Self-pay | Admitting: Adult Health

## 2022-05-19 NOTE — Telephone Encounter (Signed)
Scheduled appointment per 1/4 los. Talked with the patients daughter and she is aware of the made appointments.

## 2022-06-10 ENCOUNTER — Other Ambulatory Visit: Payer: Self-pay

## 2022-06-12 ENCOUNTER — Other Ambulatory Visit: Payer: Self-pay

## 2022-06-12 ENCOUNTER — Inpatient Hospital Stay (HOSPITAL_BASED_OUTPATIENT_CLINIC_OR_DEPARTMENT_OTHER): Payer: Medicaid Other | Admitting: Adult Health

## 2022-06-12 ENCOUNTER — Encounter: Payer: Self-pay | Admitting: Adult Health

## 2022-06-12 ENCOUNTER — Ambulatory Visit (HOSPITAL_COMMUNITY)
Admission: RE | Admit: 2022-06-12 | Discharge: 2022-06-12 | Disposition: A | Discharge status: Home / Self Care | Payer: Medicaid Other | Source: Ambulatory Visit | Attending: Adult Health | Admitting: Adult Health

## 2022-06-12 ENCOUNTER — Inpatient Hospital Stay: Payer: Medicaid Other | Attending: Hematology and Oncology

## 2022-06-12 VITALS — BP 123/73 | HR 73 | Temp 98.1°F | Resp 16 | Ht 66.0 in | Wt 199.5 lb

## 2022-06-12 DIAGNOSIS — Z9221 Personal history of antineoplastic chemotherapy: Secondary | ICD-10-CM | POA: Diagnosis not present

## 2022-06-12 DIAGNOSIS — Z923 Personal history of irradiation: Secondary | ICD-10-CM | POA: Insufficient documentation

## 2022-06-12 DIAGNOSIS — Z86718 Personal history of other venous thrombosis and embolism: Secondary | ICD-10-CM | POA: Insufficient documentation

## 2022-06-12 DIAGNOSIS — I10 Essential (primary) hypertension: Secondary | ICD-10-CM | POA: Diagnosis not present

## 2022-06-12 DIAGNOSIS — Z170 Estrogen receptor positive status [ER+]: Secondary | ICD-10-CM

## 2022-06-12 DIAGNOSIS — Z79811 Long term (current) use of aromatase inhibitors: Secondary | ICD-10-CM | POA: Diagnosis not present

## 2022-06-12 DIAGNOSIS — C50411 Malignant neoplasm of upper-outer quadrant of right female breast: Secondary | ICD-10-CM | POA: Insufficient documentation

## 2022-06-12 DIAGNOSIS — J189 Pneumonia, unspecified organism: Secondary | ICD-10-CM | POA: Diagnosis not present

## 2022-06-12 DIAGNOSIS — Z87891 Personal history of nicotine dependence: Secondary | ICD-10-CM | POA: Diagnosis not present

## 2022-06-12 LAB — CBC WITH DIFFERENTIAL (CANCER CENTER ONLY)
Abs Immature Granulocytes: 0.01 10*3/uL (ref 0.00–0.07)
Basophils Absolute: 0.1 10*3/uL (ref 0.0–0.1)
Basophils Relative: 1 %
Eosinophils Absolute: 0.1 10*3/uL (ref 0.0–0.5)
Eosinophils Relative: 2 %
HCT: 30.6 % — ABNORMAL LOW (ref 36.0–46.0)
Hemoglobin: 10.4 g/dL — ABNORMAL LOW (ref 12.0–15.0)
Immature Granulocytes: 0 %
Lymphocytes Relative: 26 %
Lymphs Abs: 1.1 10*3/uL (ref 0.7–4.0)
MCH: 33.3 pg (ref 26.0–34.0)
MCHC: 34 g/dL (ref 30.0–36.0)
MCV: 98.1 fL (ref 80.0–100.0)
Monocytes Absolute: 0.5 10*3/uL (ref 0.1–1.0)
Monocytes Relative: 12 %
Neutro Abs: 2.5 10*3/uL (ref 1.7–7.7)
Neutrophils Relative %: 59 %
Platelet Count: 210 10*3/uL (ref 150–400)
RBC: 3.12 MIL/uL — ABNORMAL LOW (ref 3.87–5.11)
RDW: 13.1 % (ref 11.5–15.5)
WBC Count: 4.3 10*3/uL (ref 4.0–10.5)
nRBC: 0 % (ref 0.0–0.2)

## 2022-06-12 LAB — CMP (CANCER CENTER ONLY)
ALT: 21 U/L (ref 0–44)
AST: 23 U/L (ref 15–41)
Albumin: 3.9 g/dL (ref 3.5–5.0)
Alkaline Phosphatase: 94 U/L (ref 38–126)
Anion gap: 4 — ABNORMAL LOW (ref 5–15)
BUN: 14 mg/dL (ref 8–23)
CO2: 28 mmol/L (ref 22–32)
Calcium: 9.5 mg/dL (ref 8.9–10.3)
Chloride: 105 mmol/L (ref 98–111)
Creatinine: 0.95 mg/dL (ref 0.44–1.00)
GFR, Estimated: >60 mL/min (ref >60)
Glucose, Bld: 93 mg/dL (ref 70–99)
Potassium: 4.6 mmol/L (ref 3.5–5.1)
Sodium: 137 mmol/L (ref 135–145)
Total Bilirubin: 0.3 mg/dL (ref 0.3–1.2)
Total Protein: 6.7 g/dL (ref 6.5–8.1)

## 2022-06-12 NOTE — Progress Notes (Signed)
[facility] Follow up:    [place], Nelda Bucks, NP [place] [address]   DIAGNOSIS:  Cancer Staging  Malignant neoplasm of upper-outer quadrant of right breast in female, estrogen receptor positive ([place]) Staging form: Breast, AJCC 8th Edition - Clinical stage from 02/06/2020: Stage IIIA (cT2, cN1, cM0, G3, ER+, PR-, HER2-) - Signed by Eppie Gibson, MD on 02/22/2020 Stage prefix: Initial diagnosis Histologic grading system: 3 grade system - Pathologic stage from 07/26/2020: No Stage Recommended (ypT1c, pN1a, cM0) - Signed by Gardenia Phlegm, NP on 08/08/2020 Stage prefix: Post-therapy   SUMMARY OF ONCOLOGIC HISTORY: 67 y.o. [place] woman status post right breast upper outer quadrant (12:00) and axillary lymph node biopsy 02/03/2020, both positive for a clinical T1c N1, stage IIB invasive ductal carcinoma, grade 3, estrogen receptor positive, HER-2 and progesterone receptor negative, with an MIB-1 of 60%.             (a) biopsy of a second right upper outer quadrant T2 lesion (11 o'clock) 02/06/2020 also showed invasive ductal carcinoma, but grade 2, estrogen receptor positive, progesterone receptor and HER-2 negative, with an MIB-1 of 15%             (b) biopsy of a third right breast lesion (1:00) was benign              (1) neoadjuvant chemotherapy consisting of cyclophosphamide and docetaxel every 21 days x 4 starting 03/05/2020             (a) discontinued after 2 cycles because of peripheral neuropathy             (b) cyclophosphamide/doxorubicin started 04/24/2020, repeated every 21 days x4, completed 06/25/2020             (c) echo 04/17/2020 showed EF of 55-60%   (2) right lumpectomy 07/26/2020 found a residual pT1c pN1 invasive ductal carcinoma, grade 2, with negative margins             (a) repeat prognostic indicators show the tumor to be estrogen receptor positive, progesterone receptor and HER2 negative, with an MIB-1 of 2%.              (b) a total of 6 right axillary lymph nodes removed, 2 positive   (3) adjuvant radiation completed 10/17/2020   (4) anastrozole started June 2022   (5) CT scan of the chest 04/23/2020 shows a left brachiocephalicl catheter associated clot             (a) rivaroxaban started 04/24/2020, discontinued 08/22/2020             (b) port removed 07/26/2020   (6) Verzenio added at 100 mg p.o. twice daily in March 2023 (on hold)  CURRENT THERAPY: Anastrozole  INTERVAL HISTORY: [name] [name] 67 y.o. female returns for f/u of her previous pneumonia and to understand if she can restart Verzenio.  She let me know that she is doing well on Anastrozole and is tolerating it well.    She let me know she is recovering well from her pneumonia and denies any new shortness of breath.  She is improving in her mobility. She plans on going out of the country soon and plans on returning in April-May.    Patient Active Problem List   Diagnosis Date Noted   Sepsis ([place]) 04/24/2022   Primary osteoarthritis of right knee 02/11/2022   Status post total left knee replacement 11/18/2021   Primary osteoarthritis of left  knee 08/06/2021   Aortic atherosclerosis ([place]) 04/24/2020   Deep venous thrombosis of left upper extremity ([place]) 04/24/2020   Port-A-Cath in place 03/26/2020   Malignant neoplasm of upper-outer quadrant of right breast in female, estrogen receptor positive ([place]) 02/13/2020    is allergic to sulfa antibiotics.  MEDICAL HISTORY: Past Medical History:  Diagnosis Date   Arthritis    Breast cancer ([place])    Cancer ([place])    breast   Diabetes mellitus without complication ([place])    History of MRI    Knee, Hip, and Back.   Hypertension    Personal history of chemotherapy    Personal history of radiation therapy     SURGICAL HISTORY: Past Surgical History:  Procedure Laterality Date   BREAST BIOPSY Right 02/03/2020   x2   BREAST BIOPSY Right 02/06/2020   x2   BREAST LUMPECTOMY Right  07/26/2020   BREAST LUMPECTOMY WITH RADIOACTIVE SEED AND SENTINEL LYMPH NODE BIOPSY Right 07/26/2020   Procedure: RIGHT BREAST LUMPECTOMY WITH RADIOACTIVE SEED X 2 AND SENTINEL LYMPH NODE BIOPSY;  Surgeon: Erroll Luna, MD;  Location: [facility];  Service: General;  Laterality: Right;   IR IMAGING GUIDED PORT INSERTION  02/23/2020   PORT-A-CATH REMOVAL N/A 07/26/2020   Procedure: REMOVAL PORT-A-CATH;  Surgeon: Erroll Luna, MD;  Location: [facility];  Service: General;  Laterality: N/A;   TOTAL KNEE ARTHROPLASTY Left 11/18/2021   Procedure: LEFT TOTAL KNEE ARTHROPLASTY;  Surgeon: Leandrew Koyanagi, MD;  Location: [facility];  Service: Orthopedics;  Laterality: Left;    SOCIAL HISTORY: Social History   Socioeconomic History   Marital status: Single    Spouse name: Not on file   Number of children: 1   Years of education: Not on file   Highest education level: 9th grade  Occupational History   Not on file  Tobacco Use   Smoking status: Never   Smokeless tobacco: Never  Vaping Use   Vaping Use: Never used  Substance and Sexual Activity   Alcohol use: Yes    Alcohol/week: 1.0 standard drink of alcohol    Types: 1 Glasses of wine per week    Comment: occasionally   Drug use: Never   Sexual activity: Not Currently  Other Topics Concern   Not on file  Social History Narrative   Tobacco use, amount per day now:   Past tobacco use, amount per day:   How many years did you use tobacco:   Alcohol use (drinks per week): 1 drink occasionally.   Diet:   Do you drink/eat things with caffeine: Yes   Marital status:  Single                                What year were you married?   Do you live in a house, apartment, assisted living, condo, trailer, etc.? House   Is it one or more stories? One   How many persons live in your home? 5   Do you have pets in your home?( please list) Cat   Highest Level of education completed: High School.   Current or past  profession: Armed forces operational officer.   Do you exercise?  Yes                                   Type and how often? 2 times  weekly.   Do you have a living will? No   Do you have a DNR form?    No                               If not, do you want to discuss one?   Do you have signed POA/HPOA forms?     No                   If so, please bring to you appointment    Do you have any difficulty bathing or dressing yourself? No   Do you have difficulty preparing food or eating? No   Do you have difficulty managing your medications? No   Do you have any difficulty managing your finances? No   Do you have any difficulty affording your medications? No          Social Determinants of Health   Financial Resource Strain: Not on file  Food Insecurity: No Food Insecurity (04/25/2022)   Hunger Vital Sign    Worried About Running Out of Food in the Last Year: Never true    Ran Out of Food in the Last Year: Never true  Transportation Needs: No Transportation Needs (04/25/2022)   PRAPARE - Hydrologist (Medical): No    Lack of Transportation (Non-Medical): No  Physical Activity: Not on file  Stress: Not on file  Social Connections: Not on file  Intimate Partner Violence: Not At Risk (04/25/2022)   Humiliation, Afraid, Rape, and Kick questionnaire    Fear of Current or Ex-Partner: No    Emotionally Abused: No    Physically Abused: No    Sexually Abused: No    FAMILY HISTORY: Family History  Problem Relation Age of Onset   Asthma Mother    Arthritis Sister    Asthma Brother    Arthritis Brother     Review of Systems  Constitutional:  Negative for appetite change, chills, fatigue, fever and unexpected weight change.  HENT:   Negative for hearing loss, lump/mass and trouble swallowing.   Eyes:  Negative for eye problems and icterus.  Respiratory:  Negative for chest tightness, cough and shortness of breath.   Cardiovascular:  Negative for chest pain, leg swelling and  palpitations.  Gastrointestinal:  Negative for abdominal distention, abdominal pain, constipation, diarrhea, nausea and vomiting.  Endocrine: Negative for hot flashes.  Genitourinary:  Negative for difficulty urinating.   Musculoskeletal:  Negative for arthralgias.  Skin:  Negative for itching and rash.  Neurological:  Negative for dizziness, extremity weakness, headaches and numbness.  Hematological:  Negative for adenopathy. Does not bruise/bleed easily.  Psychiatric/Behavioral:  Negative for depression. The patient is not nervous/anxious.       PHYSICAL EXAMINATION  ECOG PERFORMANCE STATUS: 0 - Asymptomatic  Vitals:   06/12/22 1504  BP: 123/73  Pulse: 73  Resp: 16  Temp: 98.1 F (36.7 C)  SpO2: 100%    Physical Exam Constitutional:      General: She is not in acute distress.    Appearance: Normal appearance. She is not toxic-appearing.  HENT:     Head: Normocephalic and atraumatic.  Eyes:     General: No scleral icterus. Cardiovascular:     Rate and Rhythm: Normal rate and regular rhythm.     Pulses: Normal pulses.     Heart sounds: Normal heart sounds.  Pulmonary:     Effort:  Pulmonary effort is normal.     Breath sounds: Normal breath sounds.  Abdominal:     General: Abdomen is flat. Bowel sounds are normal. There is no distension.     Palpations: Abdomen is soft.     Tenderness: There is no abdominal tenderness.  Musculoskeletal:        General: No swelling.     Cervical back: Neck supple.  Lymphadenopathy:     Cervical: No cervical adenopathy.  Skin:    General: Skin is warm and dry.     Findings: No rash.  Neurological:     General: No focal deficit present.     Mental Status: She is alert.  Psychiatric:        Mood and Affect: Mood normal.        Behavior: Behavior normal.     LABORATORY DATA:  CBC    Component Value Date/Time   WBC 4.3 06/12/2022 1437   WBC 4.3 05/02/2022 1535   RBC 3.12 (L) 06/12/2022 1437   HGB 10.4 (L) 06/12/2022 1437    HCT 30.6 (L) 06/12/2022 1437   PLT 210 06/12/2022 1437   MCV 98.1 06/12/2022 1437   MCH 33.3 06/12/2022 1437   MCHC 34.0 06/12/2022 1437   RDW 13.1 06/12/2022 1437   LYMPHSABS 1.1 06/12/2022 1437   MONOABS 0.5 06/12/2022 1437   EOSABS 0.1 06/12/2022 1437   BASOSABS 0.1 06/12/2022 1437    CMP     Component Value Date/Time   NA 137 06/12/2022 1437   K 4.6 06/12/2022 1437   CL 105 06/12/2022 1437   CO2 28 06/12/2022 1437   GLUCOSE 93 06/12/2022 1437   BUN 14 06/12/2022 1437   CREATININE 0.95 06/12/2022 1437   CREATININE 0.81 05/02/2022 1535   CALCIUM 9.5 06/12/2022 1437   PROT 6.7 06/12/2022 1437   ALBUMIN 3.9 06/12/2022 1437   AST 23 06/12/2022 1437   ALT 21 06/12/2022 1437   ALKPHOS 94 06/12/2022 1437   BILITOT 0.3 06/12/2022 1437   GFRNONAA >60 06/12/2022 1437    ASSESSMENT and THERAPY PLAN:   Malignant neoplasm of upper-outer quadrant of right breast in female, estrogen receptor positive ([place]) [name] [name] is a 67 year old woman with h/o stage IIB ER/PR positive breast cancer s/p neoadjuvant chemotherapy, lumpectomy, adjuvant radiation therapy, and began Anastrozole in 10/2020.   [name] [name] will continue on Anastrozole daily.  Her lungs sound good today. We will obtain a repeat chest xray to evaluate her lungs to see if they are improved from the pneumonia.    [name] [name] and I discussed potentially restarting Verzenio as she is feeling better and her labs are normal.  Due to her upcoming international travel, we will wait on restarting Verzenio until she returns.    [name] [name] will undergo chest xray and we will see her back in May, 2024 for labs and f/u with Dr. Chryl Heck.     All questions were answered. The patient knows to call the clinic with any problems, questions or concerns. We can certainly see the patient much sooner if necessary.  Total encounter time:20 minutes*in face-to-face visit time, chart review, lab review, care coordination, order entry, and documentation of the encounter  time.    Wilber Bihari, NP 06/15/22 9:04 PM Medical Oncology and Hematology [facility] [place], [place] [address] Tel. [phone]    Fax. [phone]  *Total Encounter Time as defined by the Centers for Medicare and Medicaid Services includes, in addition to the face-to-face time  of a patient visit (documented in the note above) non-face-to-face time: obtaining and reviewing outside history, ordering and reviewing medications, tests or procedures, care coordination (communications with other health care professionals or caregivers) and documentation in the medical record.  

## 2022-06-13 ENCOUNTER — Telehealth: Payer: Self-pay

## 2022-06-13 NOTE — Telephone Encounter (Signed)
-----   Message from Gardenia Phlegm, NP sent at 06/13/2022 12:21 PM EST ----- Please let the patient know that her pneumonia resolved on x-ray and her lungs look good.  This is good news and what we needed to see in order to restart the Verzenio when she returns in April or May. ----- Message ----- From: Buel Ream, Rad Results In Sent: 06/13/2022  10:50 AM EST To: Gardenia Phlegm, NP

## 2022-06-13 NOTE — Telephone Encounter (Signed)
Spoke with patient's daughter regarding chest x-ray per Wilber Bihari NP, showing that DeniseTodd pneumonia has resolved and her lungs look good. That this is what she needed to see in order for her to restart the Verzenio when she returns in [place]. Patient's daughter verbalized understanding and stated she will let her mother Kortni Hasten) know this information. No questions or concerns.

## 2022-06-15 NOTE — Assessment & Plan Note (Signed)
[name] [name] is a 67 year old woman with h/o stage IIB ER/PR positive breast cancer s/p neoadjuvant chemotherapy, lumpectomy, adjuvant radiation therapy, and began Anastrozole in 10/2020.   [name] [name] will continue on Anastrozole daily.  Her lungs sound good today. We will obtain a repeat chest xray to evaluate her lungs to see if they are improved from the pneumonia.    [name] [name] and I discussed potentially restarting Verzenio as she is feeling better and her labs are normal.  Due to her upcoming international travel, we will wait on restarting Verzenio until she returns.    [name] [name] will undergo chest xray and we will see her back in May, 2024 for labs and f/u with Dr. Chryl Heck.

## 2022-06-16 ENCOUNTER — Telehealth: Payer: Self-pay | Admitting: Adult Health

## 2022-06-16 NOTE — Telephone Encounter (Signed)
Called patient to get appointments scheduled per 2/1 los. Talked with the patients daughter [name] and she states that the patient will be going out of the country and at this time, they are not sure when she will return. Patients daughter also states that she will call back to schedule the FU once the patient is back in the states.

## 2022-06-30 ENCOUNTER — Other Ambulatory Visit: Payer: Self-pay | Admitting: Adult Health

## 2022-07-03 ENCOUNTER — Encounter: Payer: Self-pay | Admitting: Orthopaedic Surgery

## 2022-07-03 ENCOUNTER — Other Ambulatory Visit: Payer: Self-pay

## 2022-07-03 MED ORDER — AMOXICILLIN 500 MG PO TABS: ORAL_TABLET | ORAL | 0 refills | Status: DC

## 2022-07-10 ENCOUNTER — Other Ambulatory Visit: Payer: Self-pay

## 2022-07-10 ENCOUNTER — Other Ambulatory Visit (HOSPITAL_COMMUNITY): Payer: Self-pay

## 2022-07-10 MED ORDER — VALSARTAN 320 MG PO TABS: 320.0000 mg | ORAL_TABLET | Freq: Every day | ORAL | 0 refills | Status: DC

## 2022-07-21 ENCOUNTER — Other Ambulatory Visit: Payer: Self-pay

## 2022-07-21 ENCOUNTER — Other Ambulatory Visit: Payer: No Typology Code available for payment source

## 2022-07-21 DIAGNOSIS — G8929 Other chronic pain: Secondary | ICD-10-CM

## 2022-07-21 DIAGNOSIS — E785 Hyperlipidemia, unspecified: Secondary | ICD-10-CM

## 2022-07-21 DIAGNOSIS — E1142 Type 2 diabetes mellitus with diabetic polyneuropathy: Secondary | ICD-10-CM

## 2022-07-21 DIAGNOSIS — M159 Polyosteoarthritis, unspecified: Secondary | ICD-10-CM

## 2022-07-21 DIAGNOSIS — I10 Essential (primary) hypertension: Secondary | ICD-10-CM

## 2022-07-21 MED ORDER — ATORVASTATIN CALCIUM 40 MG PO TABS: 40.0000 mg | ORAL_TABLET | Freq: Every day | ORAL | 1 refills | Status: DC

## 2022-07-21 MED ORDER — DICLOFENAC SODIUM 75 MG PO TBEC: 75.0000 mg | DELAYED_RELEASE_TABLET | Freq: Two times a day (BID) | ORAL | 5 refills | Status: DC

## 2022-07-22 ENCOUNTER — Other Ambulatory Visit: Payer: Self-pay

## 2022-07-22 LAB — CBC WITH DIFFERENTIAL/PLATELET
Absolute Monocytes: 275 cells/uL (ref 200–950)
Basophils Absolute: 32 cells/uL (ref 0–200)
Basophils Relative: 1 %
Eosinophils Absolute: 80 cells/uL (ref 15–500)
Eosinophils Relative: 2.5 %
HCT: 32.1 % — ABNORMAL LOW (ref 35.0–45.0)
Hemoglobin: 10.6 g/dL — ABNORMAL LOW (ref 11.7–15.5)
Lymphs Abs: 733 cells/uL — ABNORMAL LOW (ref 850–3900)
MCH: 31.3 pg (ref 27.0–33.0)
MCHC: 33 g/dL (ref 32.0–36.0)
MCV: 94.7 fL (ref 80.0–100.0)
MPV: 10.3 fL (ref 7.5–12.5)
Monocytes Relative: 8.6 %
Neutro Abs: 2080 cells/uL (ref 1500–7800)
Neutrophils Relative %: 65 %
Platelets: 211 10*3/uL (ref 140–400)
RBC: 3.39 10*6/uL — ABNORMAL LOW (ref 3.80–5.10)
RDW: 11.8 % (ref 11.0–15.0)
Total Lymphocyte: 22.9 %
WBC: 3.2 10*3/uL — ABNORMAL LOW (ref 3.8–10.8)

## 2022-07-22 LAB — COMPLETE METABOLIC PANEL WITH GFR
AG Ratio: 1.9 (calc) (ref 1.0–2.5)
ALT: 18 U/L (ref 6–29)
AST: 21 U/L (ref 10–35)
Albumin: 4.2 g/dL (ref 3.6–5.1)
Alkaline phosphatase (APISO): 81 U/L (ref 37–153)
BUN: 13 mg/dL (ref 7–25)
CO2: 27 mmol/L (ref 20–32)
Calcium: 9.4 mg/dL (ref 8.6–10.4)
Chloride: 106 mmol/L (ref 98–110)
Creat: 0.75 mg/dL (ref 0.50–1.05)
Globulin: 2.2 g/dL (calc) (ref 1.9–3.7)
Glucose, Bld: 91 mg/dL (ref 65–99)
Potassium: 4.6 mmol/L (ref 3.5–5.3)
Sodium: 142 mmol/L (ref 135–146)
Total Bilirubin: 0.5 mg/dL (ref 0.2–1.2)
Total Protein: 6.4 g/dL (ref 6.1–8.1)
eGFR: 89 mL/min/{1.73_m2} (ref >=60)

## 2022-07-22 LAB — LIPID PANEL
Cholesterol: 121 mg/dL (ref <200)
HDL: 60 mg/dL (ref >=50)
LDL Cholesterol (Calc): 48 mg/dL (calc)
Non-HDL Cholesterol (Calc): 61 mg/dL (calc) (ref <130)
Total CHOL/HDL Ratio: 2 (calc) (ref <5.0)
Triglycerides: 52 mg/dL (ref <150)

## 2022-07-22 LAB — TSH: TSH: 2.72 mIU/L (ref 0.40–4.50)

## 2022-07-22 LAB — HEMOGLOBIN A1C
Hgb A1c MFr Bld: 5.7 % of total Hgb — ABNORMAL HIGH (ref <5.7)
Mean Plasma Glucose: 117 mg/dL
eAG (mmol/L): 6.5 mmol/L

## 2022-07-24 ENCOUNTER — Ambulatory Visit: Payer: No Typology Code available for payment source | Admitting: Pharmacist

## 2022-07-24 ENCOUNTER — Other Ambulatory Visit: Payer: No Typology Code available for payment source

## 2022-07-24 ENCOUNTER — Other Ambulatory Visit (HOSPITAL_COMMUNITY): Payer: Self-pay

## 2022-07-25 ENCOUNTER — Other Ambulatory Visit (HOSPITAL_COMMUNITY): Payer: Self-pay

## 2022-07-25 ENCOUNTER — Encounter: Payer: Self-pay | Admitting: Family

## 2022-07-25 MED ORDER — DICLOFENAC SODIUM 75 MG PO TBEC
75.0000 mg | DELAYED_RELEASE_TABLET | Freq: Two times a day (BID) | ORAL | 5 refills | Status: DC
  Filled 2022-07-25 – 2023-01-05 (×2): qty 60, 30d supply, fill #0

## 2022-07-29 NOTE — Progress Notes (Signed)
  This encounter was created in error - please disregard. No show 

## 2022-08-05 ENCOUNTER — Other Ambulatory Visit (HOSPITAL_COMMUNITY): Payer: Self-pay

## 2022-08-20 ENCOUNTER — Other Ambulatory Visit (HOSPITAL_COMMUNITY): Payer: Self-pay

## 2022-09-16 ENCOUNTER — Telehealth: Payer: Self-pay | Admitting: Hematology and Oncology

## 2022-09-23 ENCOUNTER — Other Ambulatory Visit: Payer: Self-pay

## 2022-09-23 MED ORDER — METFORMIN HCL 1000 MG PO TABS: 1000.0000 mg | ORAL_TABLET | Freq: Two times a day (BID) | ORAL | 0 refills | Status: DC

## 2022-09-23 MED ORDER — LANSOPRAZOLE 30 MG PO CPDR: 30.0000 mg | DELAYED_RELEASE_CAPSULE | Freq: Every morning | ORAL | 0 refills | Status: DC

## 2022-10-07 ENCOUNTER — Other Ambulatory Visit: Payer: Self-pay | Admitting: Family

## 2022-10-16 ENCOUNTER — Inpatient Hospital Stay: Payer: Medicaid Other | Attending: Hematology and Oncology

## 2022-10-16 ENCOUNTER — Inpatient Hospital Stay (HOSPITAL_BASED_OUTPATIENT_CLINIC_OR_DEPARTMENT_OTHER): Payer: Medicaid Other | Admitting: Hematology and Oncology

## 2022-10-16 ENCOUNTER — Other Ambulatory Visit: Payer: Self-pay

## 2022-10-16 VITALS — BP 148/86 | HR 64 | Temp 97.9°F | Resp 19 | Wt 201.8 lb

## 2022-10-16 DIAGNOSIS — C50411 Malignant neoplasm of upper-outer quadrant of right female breast: Secondary | ICD-10-CM | POA: Insufficient documentation

## 2022-10-16 DIAGNOSIS — Z79899 Other long term (current) drug therapy: Secondary | ICD-10-CM | POA: Diagnosis not present

## 2022-10-16 DIAGNOSIS — Z170 Estrogen receptor positive status [ER+]: Secondary | ICD-10-CM | POA: Diagnosis not present

## 2022-10-16 DIAGNOSIS — Z923 Personal history of irradiation: Secondary | ICD-10-CM | POA: Diagnosis not present

## 2022-10-16 DIAGNOSIS — Z79811 Long term (current) use of aromatase inhibitors: Secondary | ICD-10-CM | POA: Insufficient documentation

## 2022-10-16 LAB — CMP (CANCER CENTER ONLY)
ALT: 22 U/L (ref 0–44)
AST: 25 U/L (ref 15–41)
Albumin: 4 g/dL (ref 3.5–5.0)
Alkaline Phosphatase: 92 U/L (ref 38–126)
Anion gap: 4 — ABNORMAL LOW (ref 5–15)
BUN: 12 mg/dL (ref 8–23)
CO2: 29 mmol/L (ref 22–32)
Calcium: 9.3 mg/dL (ref 8.9–10.3)
Chloride: 106 mmol/L (ref 98–111)
Creatinine: 0.79 mg/dL (ref 0.44–1.00)
GFR, Estimated: >60 mL/min (ref >60)
Glucose, Bld: 133 mg/dL — ABNORMAL HIGH (ref 70–99)
Potassium: 4.4 mmol/L (ref 3.5–5.1)
Sodium: 139 mmol/L (ref 135–145)
Total Bilirubin: 0.3 mg/dL (ref 0.3–1.2)
Total Protein: 6.5 g/dL (ref 6.5–8.1)

## 2022-10-16 LAB — CBC WITH DIFFERENTIAL (CANCER CENTER ONLY)
Abs Immature Granulocytes: 0.01 10*3/uL (ref 0.00–0.07)
Basophils Absolute: 0 10*3/uL (ref 0.0–0.1)
Basophils Relative: 1 %
Eosinophils Absolute: 0.1 10*3/uL (ref 0.0–0.5)
Eosinophils Relative: 3 %
HCT: 30.9 % — ABNORMAL LOW (ref 36.0–46.0)
Hemoglobin: 10 g/dL — ABNORMAL LOW (ref 12.0–15.0)
Immature Granulocytes: 0 %
Lymphocytes Relative: 26 %
Lymphs Abs: 0.8 10*3/uL (ref 0.7–4.0)
MCH: 30.3 pg (ref 26.0–34.0)
MCHC: 32.4 g/dL (ref 30.0–36.0)
MCV: 93.6 fL (ref 80.0–100.0)
Monocytes Absolute: 0.2 10*3/uL (ref 0.1–1.0)
Monocytes Relative: 7 %
Neutro Abs: 2 10*3/uL (ref 1.7–7.7)
Neutrophils Relative %: 63 %
Platelet Count: 192 10*3/uL (ref 150–400)
RBC: 3.3 MIL/uL — ABNORMAL LOW (ref 3.87–5.11)
RDW: 14.1 % (ref 11.5–15.5)
WBC Count: 3.1 10*3/uL — ABNORMAL LOW (ref 4.0–10.5)
nRBC: 0 % (ref 0.0–0.2)

## 2022-10-16 NOTE — Progress Notes (Signed)
[name] [name]  [phone] Fax:([phone]) [phone]     ID: [name] [name] DOB: 03/17/1956  MR#: [id number]  CSN#:[id number]  Patient Care Team: Caesar Bookman, NP as PCP - General (Family Medicine) Pershing Proud, RN as Oncology Nurse Navigator Donnelly Angelica, RN as Oncology Nurse Navigator Harriette Bouillon, MD as Consulting Physician (General Surgery) Lonie Peak, MD as Attending Physician (Radiation Oncology) Rachel Moulds, MD as Medical Oncologist (Hematology and Oncology) Anselm Lis, RPH-CPP as Pharmacist (Hematology and Oncology) Rachel Moulds, MD   CHIEF COMPLAINT: Estrogen receptor positive breast cancer  CURRENT TREATMENT: Anastrozole with abemaciclib.  INTERVAL HISTORY:  [name] [name] returns today for follow up of her estrogen receptor positive breast cancer.  She today arrived by herself to the appointment.  Last visit, she was very hypotensive and we had to send her to the ED, treated for sepsis secondary to pneumonia. Since her last visit here in December when she required hospitalization, she has not been taking the abemaciclib.  She met with our nurse practitioner in February but was reluctant to restart the abemaciclib since she was planning international travel back home.  She is only taking anastrozole and has been feeling really well today.  She denies any complaints today.  No changes in her breast.  No change in breathing, bowel habits or urinary habits.  Rest of the pertinent 10 point ROS reviewed and negative   COVID 19 VACCINATION STATUS: s/p  [employer]  X2, most recently June 2021, no booster as of 05/01/2020   HISTORY OF CURRENT ILLNESS: From the original intake note:  [name] herself palpated a mass in the right axilla "a long time ago", more recently with complaints of associated pain. She brought it to medical attention and underwent bilateral diagnostic mammography with tomography and bilateral breast ultrasonography at  [name] on 01/31/2020 showing: breast density category B; three right breast masses-- 1.9 cm at 12 o'clock, 2.3 cm at 11 o'clock, 0.4 cm at 1 o'clock; two adjacent enlarged thickened lymph nodes measuring up to 2.1 cm; no evidence of left breast malignancy.  Accordingly on 02/03/2020 she proceeded to biopsy of the right breast area at 12 o'clock and right axilla. The pathology from this procedure ([id number]) showed: invasive ductal carcinoma, grade 3. Prognostic indicators significant for: estrogen receptor, 100% positive with strong staining intensity and progesterone receptor, 0% negative. Proliferation marker Ki67 at 60%. HER2 equivocal by immunohistochemistry (2+), but negative by fluorescent in situ hybridization with a signals ratio 0.81 and number per cell 2.7.  The biopsied lymph node was found to show metastatic carcinoma. The morphology was considered different from the biopsied mass, and a prognostic panel was performed. Estrogen receptor, 100% positive with strong staining intensity and progesterone receptor, 0% negative. Proliferation marker Ki67 at 70%. HER2 equivcoal by immunohistochemistry (2+), but negative) by fluorescent in situ hybridization with a signals ratio 1.16 and number per cell 3.9.  She underwent additional right breast biopsies on 02/06/2020. Pathology [id number]) from the mass at 11 o'clock showed: invasive ductal carcinoma, grade 2. Prognostic indicators significant for: estrogen receptor, 100% positive with strong staining intensity and progesterone receptor, 0% negative. Proliferation marker Ki67 at 15%. HER2 equivocal by immunohistochemistry (2+), but negative by fluorescent in situ hybridization with a signals ratio 1.29 and number per cell 2.7.  The biopsied mass at 1 o'clock showed only fibrocystic change.  The patient's subsequent history is as detailed below.   PAST MEDICAL HISTORY: Past Medical History:  Diagnosis Date  Arthritis    Breast cancer  (HCC)    Cancer (HCC)    breast   Diabetes mellitus without complication (HCC)    History of MRI    Knee, Hip, and Back.   Hypertension    Personal history of chemotherapy    Personal history of radiation therapy     PAST SURGICAL HISTORY: Past Surgical History:  Procedure Laterality Date   BREAST BIOPSY Right 02/03/2020   x2   BREAST BIOPSY Right 02/06/2020   x2   BREAST LUMPECTOMY Right 07/26/2020   BREAST LUMPECTOMY WITH RADIOACTIVE SEED AND SENTINEL LYMPH NODE BIOPSY Right 07/26/2020   Procedure: RIGHT BREAST LUMPECTOMY WITH RADIOACTIVE SEED X 2 AND SENTINEL LYMPH NODE BIOPSY;  Surgeon: Harriette Bouillon, MD;  Location: [name];  Service: General;  Laterality: Right;   IR IMAGING GUIDED PORT INSERTION  02/23/2020   PORT-A-CATH REMOVAL N/A 07/26/2020   Procedure: REMOVAL PORT-A-CATH;  Surgeon: Harriette Bouillon, MD;  Location: [name];  Service: General;  Laterality: N/A;   TOTAL KNEE ARTHROPLASTY Left 11/18/2021   Procedure: LEFT TOTAL KNEE ARTHROPLASTY;  Surgeon: [name], MD;  Location: [facility];  Service: Orthopedics;  Laterality: Left;    FAMILY HISTORY: Family History  Problem Relation Age of Onset   Asthma Mother    Arthritis Sister    Asthma Brother    Arthritis Brother   [name] died in his 82s from causes unknown to the patient.  [name] died from heart disease at age 12.  The patient had 4 brothers, 1 sister, with no history of cancer in the family to her knowledge   GYNECOLOGIC HISTORY:  No LMP recorded. Patient is postmenopausal. Menarche: 67 years old Age at first live birth: 67 years old GX P 1 LMP 25s Contraceptive HRT no  Hysterectomy? no BSO? no   SOCIAL HISTORY: (updated 02/2020)  Alma is originally from [place].  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  She describes herself as single.  At home she lives with her daughter [name] who works for [employer], her daughters'  husband [name] who is a Insurance underwriter, and their children who are 51 months old and 22 years old.  The patient is [place]    ADVANCED DIRECTIVES: Not in place.  The patient tells me she would intend to name her daughter Elmarie Shiley as her healthcare power of attorney   HEALTH MAINTENANCE: Social History   Tobacco Use   Smoking status: Never   Smokeless tobacco: Never  Vaping Use   Vaping Use: Never used  Substance Use Topics   Alcohol use: Yes    Alcohol/week: 1.0 standard drink of alcohol    Types: 1 Glasses of wine per week    Comment: occasionally   Drug use: Never     Colonoscopy: never done  PAP: 2017 (performed in [place])  Bone density:    Allergies  Allergen Reactions   Sulfa Antibiotics Itching    Current Outpatient Medications  Medication Sig Dispense Refill   amoxicillin (AMOXIL) 500 MG tablet Take 4 tablets by mouth 1 hour prior to dental procedure. 8 tablet 0   anastrozole (ARIMIDEX) 1 MG tablet Take 1 tablet (1 mg total) by mouth daily. 90 tablet 4   atorvastatin (LIPITOR) 40 MG tablet Take 1 tablet (40 mg total) by mouth daily. 90 tablet 1   calcium-vitamin D (OSCAL WITH D) 500-5 MG-MCG tablet Take 1 tablet by mouth  daily. Will bring in dosing to next visit     carvedilol (COREG) 6.25 MG tablet Take 1 tablet (6.25 mg total) by mouth 2 (two) times daily with a meal. 180 tablet 1   diclofenac (VOLTAREN) 75 MG EC tablet Take 1 tablet (75 mg total) by mouth 2 (two) times daily. 60 tablet 5   lansoprazole (PREVACID) 30 MG capsule Take 1 capsule (30 mg total) by mouth every morning. 30 capsule 0   Magnesium 100 MG CAPS Take 100 mg by mouth daily.     metFORMIN (GLUCOPHAGE) 1000 MG tablet Take 1 tablet (1,000 mg total) by mouth 2 (two) times daily with a meal. 60 tablet 0   Multiple Vitamins-Minerals (CENTRUM SILVER 50+WOMEN PO) Take 1 capsule by mouth daily.     UNABLE TO FIND Med Name: Alcova. Pt reports taking 1 scoopful in tea or water     valsartan  (DIOVAN) 320 MG tablet Take 1 tablet (320 mg total) by mouth daily. Needs an appointment before anymore future refills. 30 tablet 0   vitamin C (ASCORBIC ACID) 500 MG tablet Take 500 mg by mouth daily.     No current facility-administered medications for this visit.    OBJECTIVE: African-American woman in no acute distress  Vitals:   10/16/22 1546  BP: (!) 148/86  Pulse: 64  Resp: 19  Temp: 97.9 F (36.6 C)  SpO2: 100%        Body mass index is 32.57 kg/m.   Wt Readings from Last 3 Encounters:  10/16/22 201 lb 12.8 oz (91.5 kg)  07/25/22 203 lb 12.8 oz (92.4 kg)  06/12/22 199 lb 8 oz (90.5 kg)     ECOG FS:1 - Symptomatic but completely ambulatory  Physical Exam Constitutional:      General: She is not in acute distress. Cardiovascular:     Rate and Rhythm: Normal rate and regular rhythm.     Heart sounds: Normal heart sounds.  Chest:     Comments: Right breast status postradiation changes.  No new palpable masses.  Left breast normal to inspection and palpation.  No regional adenopathy. Musculoskeletal:        General: No swelling.     Cervical back: Normal range of motion and neck supple. No rigidity.  Lymphadenopathy:     Cervical: No cervical adenopathy.  Skin:    General: Skin is warm and dry.  Neurological:     General: No focal deficit present.     Mental Status: She is alert.    LAB RESULTS:  CMP     Component Value Date/Time   NA 142 07/21/2022 0820   K 4.6 07/21/2022 0820   CL 106 07/21/2022 0820   CO2 27 07/21/2022 0820   GLUCOSE 91 07/21/2022 0820   BUN 13 07/21/2022 0820   CREATININE 0.75 07/21/2022 0820   CALCIUM 9.4 07/21/2022 0820   PROT 6.4 07/21/2022 0820   ALBUMIN 3.9 06/12/2022 1437   AST 21 07/21/2022 0820   AST 23 06/12/2022 1437   ALT 18 07/21/2022 0820   ALT 21 06/12/2022 1437   ALKPHOS 94 06/12/2022 1437   BILITOT 0.5 07/21/2022 0820   BILITOT 0.3 06/12/2022 1437   GFRNONAA >60 06/12/2022 1437    No results found for:  "TOTALPROTELP", "ALBUMINELP", "A1GS", "A2GS", "BETS", "BETA2SER", "GAMS", "MSPIKE", "SPEI"  Lab Results  Component Value Date   WBC 3.1 (L) 10/16/2022   NEUTROABS 2.0 10/16/2022   HGB 10.0 (L) 10/16/2022   HCT 30.9 (L) 10/16/2022  MCV 93.6 10/16/2022   PLT 192 10/16/2022    No results found for: "LABCA2"  No components found for: "ZOXWRU045"  No results for input(s): "INR" in the last 168 hours.  No results found for: "LABCA2"  No results found for: "WUJ811"  No results found for: "CAN125"  No results found for: "CAN153"  No results found for: "CA2729"  No components found for: "HGQUANT"  No results found for: "CEA1", "CEA" / No results found for: "CEA1", "CEA"   No results found for: "AFPTUMOR"  No results found for: "CHROMOGRNA"  No results found for: "KPAFRELGTCHN", "LAMBDASER", "KAPLAMBRATIO" (kappa/lambda light chains)  No results found for: "HGBA", "HGBA2QUANT", "HGBFQUANT", "HGBSQUAN" (Hemoglobinopathy evaluation)   No results found for: "LDH"  No results found for: "IRON", "TIBC", "IRONPCTSAT" (Iron and TIBC)  No results found for: "FERRITIN"  Urinalysis    Component Value Date/Time   COLORURINE YELLOW 04/24/2022 0254   APPEARANCEUR CLEAR 04/24/2022 0254   LABSPEC 1.011 04/24/2022 0254   PHURINE 5.0 04/24/2022 0254   GLUCOSEU NEGATIVE 04/24/2022 0254   HGBUR NEGATIVE 04/24/2022 0254   BILIRUBINUR NEGATIVE 04/24/2022 0254   KETONESUR NEGATIVE 04/24/2022 0254   PROTEINUR NEGATIVE 04/24/2022 0254   NITRITE NEGATIVE 04/24/2022 0254   LEUKOCYTESUR NEGATIVE 04/24/2022 0254     STUDIES: No results found.   ELIGIBLE FOR AVAILABLE RESEARCH PROTOCOL: AET  ASSESSMENT: 67 y.o. [place] woman status post right breast upper outer quadrant (12:00) and axillary lymph node biopsy 02/03/2020, both positive for a clinical T1c N1, stage IIB invasive ductal carcinoma, grade 3, estrogen receptor positive, HER-2 and progesterone receptor negative, with an  MIB-1 of 60%.  (a) biopsy of a second right upper outer quadrant T2 lesion (11 o'clock) 02/06/2020 also showed invasive ductal carcinoma, but grade 2, estrogen receptor positive, progesterone receptor and HER-2 negative, with an MIB-1 of 15%  (b) biopsy of a third right breast lesion (1:00) was benign   (1) neoadjuvant chemotherapy consisting of cyclophosphamide and docetaxel every 21 days x 4 starting 03/05/2020  (a) discontinued after 2 cycles because of peripheral neuropathy  (b) cyclophosphamide/doxorubicin started 04/24/2020, repeated every 21 days x4, completed 06/25/2020  (c) echo 04/17/2020 showed EF of 55-60%  (2) right lumpectomy 07/26/2020 found a residual pT1c pN1 invasive ductal carcinoma, grade 2, with negative margins  (a) repeat prognostic indicators show the tumor to be estrogen receptor positive, progesterone receptor and HER2 negative, with an MIB-1 of 2%.  (b) a total of 6 right axillary lymph nodes removed, 2 positive  (3) adjuvant radiation completed 10/17/2020  (4) anastrozole started June 2022  (5) CT scan of the chest 04/23/2020 shows a left brachiocephalicl catheter associated clot  (a) rivaroxaban started 04/24/2020, discontinued 08/22/2020  (b) port removed 07/26/2020  (6) Started abemaciclib in March 2023, took it till Dec 2023, stopped it since then.  Last MRI breast Jan 2023 showed no evidence of breast malignancy. Post surgical collection right breast near right nipple which could have been causing the drainage.  Mammogram in Nov with stable lumpectomy changes, recommendation was to repeat mammogram in 1 year. She is here for follow-up abemaciclib and anastrozole however she hasn't been taking it for several months now, stopped in Dec 2023.  Since the role of abemaciclib in the adjuvant setting is to reduce the risk of recurrence, with such a long interruption of several months, I am not sure if she will derive significant benefit from restarting it.  I have  discussed this in detail.  She may hence continue  on anastrozole for 5 to 10 years.  She also has baseline osteoporosis, we have previously requested dental clearance, I have asked her to follow-up on this.  In the interim she can continue calcium and vitamin D supplementation.  I will call her back in about a month to remind this.  She understands that we can treat the osteoporosis with infusions every 6 months or every year in addition to the Os-Cal.  We have discussed about the rare risk of osteonecrosis and hence the need for dental clearance.  At this time there is no concern for breast cancer recurrence.  Mammogram for fall has been ordered today.  She will continue to take anastrozole and return to clinic for follow-up.   *Total Encounter Time as defined by the Centers for Medicare and Medicaid Services includes, in addition to the face-to-face time of a patient visit (documented in the note above) non-face-to-face time: obtaining and reviewing outside history, ordering and reviewing medications, tests or procedures, care coordination (communications with other health care professionals or caregivers) and documentation in the medical record.

## 2022-10-17 ENCOUNTER — Telehealth: Payer: Self-pay | Admitting: Hematology and Oncology

## 2022-10-17 NOTE — Telephone Encounter (Signed)
Spoke with patient daughter confirming upcoming appointment 

## 2022-10-21 ENCOUNTER — Telehealth (INDEPENDENT_AMBULATORY_CARE_PROVIDER_SITE_OTHER): Payer: Medicaid Other | Admitting: Family

## 2022-10-21 ENCOUNTER — Telehealth: Payer: Self-pay | Admitting: *Deleted

## 2022-10-21 ENCOUNTER — Telehealth: Payer: Self-pay

## 2022-10-21 ENCOUNTER — Encounter: Payer: Self-pay | Admitting: Family

## 2022-10-21 DIAGNOSIS — K219 Gastro-esophageal reflux disease without esophagitis: Secondary | ICD-10-CM

## 2022-10-21 DIAGNOSIS — E1142 Type 2 diabetes mellitus with diabetic polyneuropathy: Secondary | ICD-10-CM

## 2022-10-21 DIAGNOSIS — E785 Hyperlipidemia, unspecified: Secondary | ICD-10-CM | POA: Diagnosis not present

## 2022-10-21 DIAGNOSIS — Z7984 Long term (current) use of oral hypoglycemic drugs: Secondary | ICD-10-CM

## 2022-10-21 DIAGNOSIS — G8929 Other chronic pain: Secondary | ICD-10-CM

## 2022-10-21 DIAGNOSIS — I10 Essential (primary) hypertension: Secondary | ICD-10-CM | POA: Diagnosis not present

## 2022-10-21 DIAGNOSIS — M25561 Pain in right knee: Secondary | ICD-10-CM

## 2022-10-21 DIAGNOSIS — M25562 Pain in left knee: Secondary | ICD-10-CM

## 2022-10-21 MED ORDER — VALSARTAN 320 MG PO TABS
320.0000 mg | ORAL_TABLET | Freq: Every day | ORAL | 0 refills | Status: DC
Start: 2022-10-21 — End: 2022-11-05

## 2022-10-21 MED ORDER — LANSOPRAZOLE 30 MG PO CPDR: 30.0000 mg | DELAYED_RELEASE_CAPSULE | Freq: Every morning | ORAL | 0 refills | Status: DC

## 2022-10-21 MED ORDER — METFORMIN HCL 1000 MG PO TABS
1000.0000 mg | ORAL_TABLET | Freq: Two times a day (BID) | ORAL | 0 refills | Status: DC
Start: 2022-10-21 — End: 2023-01-06

## 2022-10-21 MED ORDER — ATORVASTATIN CALCIUM 40 MG PO TABS: 40.0000 mg | ORAL_TABLET | Freq: Every day | ORAL | 1 refills | Status: DC

## 2022-10-21 NOTE — Progress Notes (Signed)
This service is provided via telemedicine  No vital signs collected/recorded due to the encounter was a telemedicine visit.   Location of patient (ex: home, work):  home  Patient consents to a telephone visit:    Location of the provider (ex: office, home):  Office  Name of any referring provider:  Yoshiharu [name], [name] Citrin, NP   Names of all persons participating in the telemedicine service and their role in the encounter:  [name] [name] (patient); [name] [name],CMA; [name] Ellithorpe,NP  Time spent on call:  6 minutes   Location:      Place of Service:    Provider: Kymberli Wiegand FNP-C   [name] [name], [name] Citrin, NP  Patient Care Team: [name] [name], [name] Citrin, NP as PCP - General (Family Medicine) [name] Proud, RN as Oncology Nurse Navigator [name] Angelica, RN as Oncology Nurse Navigator [name] Bouillon, MD as Consulting Physician (General Surgery) [name] Peak, MD as Attending Physician (Radiation Oncology) [name] Moulds, MD as Medical Oncologist (Hematology and Oncology) [name] [name], RPH-CPP as Pharmacist (Hematology and Oncology)  Extended Emergency Contact Information Primary Emergency Contact: [name] [name]          [place], [place] [address] [place] Home Phone: [phone] Mobile Phone: [phone] Relation: Daughter  Code Status:  Full Code  Goals of care: Advanced Directive information    06/12/2022    3:11 PM  Advanced Directives  Does Patient Have a Medical Advance Directive? No  Would patient like information on creating a medical advance directive? No - Patient declined     Chief Complaint  Patient presents with   Medical Management of Chronic Issues    Patient presents today for a 6 month follow-up   Quality Metric Gaps    Urine microalbumin, colonoscopy,COVID#3    HPI:  Pt is a 67 y.o. female seen today for 6 months follow up for medical management of chronic diseases.   Hypertension - B/p 100's/80's - 120's /80's  Has some headache  ,dizziness and nausea sometimes but goes away.   Type 2 DM - CBG in am 100 - 120's and 120's   GERD - states prevacid has been effective.continue to require Prevacid.   Osteoarthritis  - chronic pain on diclofenac effective.aware to take with meals.   Hyperlipidemia - previous cholesterol was normal.  States had cologuard done with [facility].will need results to request results to be faxed to [facility].  Past Medical History:  Diagnosis Date   Arthritis    Breast cancer (HCC)    Cancer (HCC)    breast   Diabetes mellitus without complication (HCC)    History of MRI    Knee, Hip, and Back.   Hypertension    Personal history of chemotherapy    Personal history of radiation therapy    Past Surgical History:  Procedure Laterality Date   BREAST BIOPSY Right 02/03/2020   x2   BREAST BIOPSY Right 02/06/2020   x2   BREAST LUMPECTOMY Right 07/26/2020   BREAST LUMPECTOMY WITH RADIOACTIVE SEED AND SENTINEL LYMPH NODE BIOPSY Right 07/26/2020   Procedure: RIGHT BREAST LUMPECTOMY WITH RADIOACTIVE SEED X 2 AND SENTINEL LYMPH NODE BIOPSY;  Surgeon: [name] Bouillon, MD;  Location: [facility];  Service: General;  Laterality: Right;   IR IMAGING GUIDED PORT INSERTION  02/23/2020   PORT-A-CATH REMOVAL N/A 07/26/2020   Procedure: REMOVAL PORT-A-CATH;  Surgeon: [name] Bouillon, MD;  Location: [facility];  Service: General;  Laterality: N/A;   TOTAL KNEE ARTHROPLASTY Left  11/18/2021   Procedure: LEFT TOTAL KNEE ARTHROPLASTY;  Surgeon: [name], MD;  Location: [facility];  Service: Orthopedics;  Laterality: Left;    Allergies  Allergen Reactions   Sulfa Antibiotics Itching    Allergies as of 10/21/2022       Reactions   Sulfa Antibiotics Itching        Medication List        Accurate as of October 21, 2022  3:10 PM. If you have any questions, ask your nurse or doctor.          amoxicillin 500 MG tablet Commonly known as: AMOXIL Take 4 tablets by mouth 1  hour prior to dental procedure.   anastrozole 1 MG tablet Commonly known as: ARIMIDEX Take 1 tablet (1 mg total) by mouth daily.   ascorbic acid 500 MG tablet Commonly known as: VITAMIN C Take 500 mg by mouth daily.   atorvastatin 40 MG tablet Commonly known as: LIPITOR Take 1 tablet (40 mg total) by mouth daily.   calcium-vitamin D 500-5 MG-MCG tablet Commonly known as: OSCAL WITH D Take 1 tablet by mouth daily. Will bring in dosing to next visit   carvedilol 6.25 MG tablet Commonly known as: COREG Take 1 tablet (6.25 mg total) by mouth 2 (two) times daily with a meal.   CENTRUM SILVER 50+WOMEN PO Take 1 capsule by mouth daily.   diclofenac 75 MG EC tablet Commonly known as: VOLTAREN Take 1 tablet (75 mg total) by mouth 2 (two) times daily.   lansoprazole 30 MG capsule Commonly known as: PREVACID Take 1 capsule (30 mg total) by mouth every morning.   Magnesium 100 MG Caps Take 100 mg by mouth daily.   metFORMIN 1000 MG tablet Commonly known as: GLUCOPHAGE Take 1 tablet (1,000 mg total) by mouth 2 (two) times daily with a meal.   UNABLE TO FIND Med Name: [place]. Pt reports taking 1 scoopful in tea or water   valsartan 320 MG tablet Commonly known as: DIOVAN Take 1 tablet (320 mg total) by mouth daily. Needs an appointment before anymore future refills.        Review of Systems  Constitutional:  Negative for appetite change, chills, fatigue, fever and unexpected weight change.  HENT:  Negative for congestion, ear pain, hearing loss, nosebleeds, postnasal drip, rhinorrhea, sinus pressure, sinus pain, sneezing, sore throat, tinnitus and trouble swallowing.   Eyes:  Negative for pain, discharge, redness, itching and visual disturbance.  Respiratory:  Negative for cough, chest tightness, shortness of breath and wheezing.   Cardiovascular:  Negative for chest pain, palpitations and leg swelling.  Gastrointestinal:  Negative for abdominal distention, abdominal  pain, blood in stool, constipation, diarrhea and vomiting.       Occasional nausea in the morning  Endocrine: Negative for cold intolerance, heat intolerance, polydipsia, polyphagia and polyuria.  Genitourinary:  Negative for difficulty urinating, dysuria, flank pain, frequency and urgency.  Musculoskeletal:  Negative for arthralgias, back pain, gait problem, joint swelling, myalgias, neck pain and neck stiffness.  Skin:  Negative for color change, pallor, rash and wound.  Neurological:  Negative for syncope, speech difficulty, weakness, light-headedness and numbness.       Occasional headache and dizziness in the morning.   Hematological:  Does not bruise/bleed easily.  Psychiatric/Behavioral:  Negative for agitation, behavioral problems, confusion, hallucinations and sleep disturbance. The patient is not nervous/anxious.     Immunization History  Administered Date(s) Administered   DTaP 09/04/2017, 10/05/2017   Hepatitis  B 09/04/2017, 10/05/2017   Influenza,inj,Quad PF,6+ Mos 04/26/2022   Influenza-Unspecified 09/04/2017, 04/19/2019   MMR 09/04/2017   Tdap 04/19/2019   Unspecified SARS-COV-2 Vaccination 09/13/2019, 11/06/2019   Varicella 09/04/2017, 06/30/2018   Pertinent  Health Maintenance Due  Topic Date Due   Colonoscopy  Never done   INFLUENZA VACCINE  12/11/2022   PAP SMEAR-Modifier  02/08/2024   MAMMOGRAM  03/27/2024      04/26/2022    7:58 AM 04/26/2022   10:17 PM 04/27/2022    8:01 AM 05/02/2022    2:58 PM 10/21/2022    2:47 PM  Fall Risk  Falls in the past year?    0 1  Was there an injury with Fall?    0 0  Fall Risk Category Calculator    0 1  Fall Risk Category (Retired)    Low   (RETIRED) Patient Fall Risk Level Moderate fall risk Low fall risk Low fall risk Low fall risk   Patient at Risk for Falls Due to    No Fall Risks History of fall(s)  Fall risk Follow up    Falls evaluation completed Falls evaluation completed   Functional Status Survey:     There were no vitals filed for this visit. There is no height or weight on file to calculate BMI. Physical Exam Constitutional:      General: She is not in acute distress.    Appearance: She is not ill-appearing.  Pulmonary:     Effort: Pulmonary effort is normal.  Neurological:     Mental Status: She is alert and oriented to person, place, and time.     Gait: Gait normal.  Psychiatric:        Mood and Affect: Mood normal.        Behavior: Behavior normal.     Labs reviewed: Recent Labs    04/24/22 2301 04/25/22 0906 04/26/22 0433 06/12/22 1437 07/21/22 0820 10/16/22 1532  NA 131* 136   < > 137 142 139  K 4.6 4.0   < > 4.6 4.6 4.4  CL 103 110   < > 105 106 106  CO2 19* 21*   < > 28 27 29   GLUCOSE 114* 99   < > 93 91 133*  BUN 21 19   < > 14 13 12   CREATININE 2.02* 1.23*   < > 0.95 0.75 0.79  CALCIUM 8.3* 8.6*   < > 9.5 9.4 9.3  MG 1.1* 2.7*  --   --   --   --   PHOS 2.3* 2.7  --   --   --   --    < > = values in this interval not displayed.   Recent Labs    04/26/22 0433 05/02/22 1535 06/12/22 1437 07/21/22 0820 10/16/22 1532  AST 50*   < > 23 21 25   ALT 102*   < > 21 18 22   ALKPHOS 147*  --  94  --  92  BILITOT 0.4   < > 0.3 0.5 0.3  PROT 5.6*   < > 6.7 6.4 6.5  ALBUMIN 2.5*  --  3.9  --  4.0   < > = values in this interval not displayed.   Recent Labs    06/12/22 1437 07/21/22 0820 10/16/22 1532  WBC 4.3 3.2* 3.1*  NEUTROABS 2.5 2,080 2.0  HGB 10.4* 10.6* 10.0*  HCT 30.6* 32.1* 30.9*  MCV 98.1 94.7 93.6  PLT 210 211 192   Lab Results  Component Value Date   TSH 2.72 07/21/2022   Lab Results  Component Value Date   HGBA1C 5.7 (H) 07/21/2022   Lab Results  Component Value Date   CHOL 121 07/21/2022   HDL 60 07/21/2022   LDLCALC 48 07/21/2022   TRIG 52 07/21/2022   CHOLHDL 2.0 07/21/2022    Significant Diagnostic Results in last 30 days:  No results found.  Assessment/Plan 1. Essential hypertension B/p well controlled. Monitor  for soft readings due to occasional dizziness and headaches. - continue on carvedilol and valsartan   2. DM type 2 with diabetic peripheral neuropathy (HCC) CBC reported well controlled  - will recheck Hgb A1c  - Urine microalbumin ration 10/23/2022  - continue on metformin   3. Hyperlipidemia LDL goal <100 Previous LDL at goal  - continue on atorvastatin  - lipid panel 10/23/2022   4. Gastroesophageal reflux disease without esophagitis Prevacid effective  - H/H stable   5. Chronic pain of both knees Continue on diclofenac   Family/ staff Communication: Reviewed plan of care with patient and daughter [name] [name] verbalized understanding.   Labs/tests ordered:  - lipid panel 10/23/2022  - Hgb A1c  - Urine microalbumin ration 10/23/2022   Next Appointment : Return in about 6 months (around 04/22/2023) for medical mangement of chronic issues., Fasting labs 10/23/2022 .   Caesar Bookman, NP  I connected with  [name] [name] on 10/21/22 by a video enabled telemedicine application and verified that I am speaking with the correct person using two identifiers.   I discussed the limitations of evaluation and management by telemedicine. The patient expressed understanding and agreed to proceed.   Spent 28 minutes of face to face with patient  >50% time spent counseling; reviewing medical record; tests; labs; and developing future plan of care.

## 2022-10-21 NOTE — Telephone Encounter (Signed)
Patient had visit today and labs were ordered.

## 2022-10-21 NOTE — Telephone Encounter (Signed)
Patient is scheduled on Thursday for Labs but no Active orders for [facility] are ordered.   Please place orders.

## 2022-10-21 NOTE — Telephone Encounter (Signed)
S/w [place] at [facility]. She states pt's dentist is not willing to give dental clearance at this time d/t the need for extraction and impression for top partial. Charlotte Sanes also states pt was a no show for last visit. Called pt's daughter to make her aware of this. She states she will call their office to make an appt for pt and let us know when it is. This message forwarded to MD.

## 2022-10-23 ENCOUNTER — Other Ambulatory Visit: Payer: Medicaid Other

## 2022-10-23 DIAGNOSIS — E785 Hyperlipidemia, unspecified: Secondary | ICD-10-CM

## 2022-10-23 DIAGNOSIS — E1142 Type 2 diabetes mellitus with diabetic polyneuropathy: Secondary | ICD-10-CM

## 2022-10-24 ENCOUNTER — Other Ambulatory Visit: Payer: Medicaid Other

## 2022-10-25 LAB — LIPID PANEL
Cholesterol: 136 mg/dL (ref <200)
HDL: 67 mg/dL (ref >=50)
LDL Cholesterol (Calc): 55 mg/dL (calc)
Non-HDL Cholesterol (Calc): 69 mg/dL (calc) (ref <130)
Total CHOL/HDL Ratio: 2 (calc) (ref <5.0)
Triglycerides: 63 mg/dL (ref <150)

## 2022-10-25 LAB — MICROALBUMIN / CREATININE URINE RATIO
Creatinine, Urine: 30 mg/dL (ref 20–275)
Microalb Creat Ratio: 77 mg/g creat — ABNORMAL HIGH (ref <30)
Microalb, Ur: 2.3 mg/dL

## 2022-11-03 ENCOUNTER — Other Ambulatory Visit: Payer: Self-pay

## 2022-11-03 MED ORDER — CARVEDILOL 6.25 MG PO TABS: 6.2500 mg | ORAL_TABLET | Freq: Two times a day (BID) | ORAL | 1 refills | Status: DC

## 2022-11-05 ENCOUNTER — Other Ambulatory Visit: Payer: Self-pay

## 2022-11-05 DIAGNOSIS — I10 Essential (primary) hypertension: Secondary | ICD-10-CM

## 2022-11-05 MED ORDER — VALSARTAN 320 MG PO TABS
320.0000 mg | ORAL_TABLET | Freq: Every day | ORAL | 0 refills | Status: DC
Start: 2022-11-05 — End: 2023-01-06

## 2022-11-10 ENCOUNTER — Other Ambulatory Visit (HOSPITAL_COMMUNITY): Payer: Self-pay

## 2022-11-10 NOTE — Progress Notes (Unsigned)
Office Visit Note   [name]           Date of Birth: [birth year]           MRN: [id number] Visit Date: 11/11/2022              Requested by: Caesar Bookman, NP [address] [place],  [place] [address] PCP: Caesar Bookman, NP   Assessment & Plan: Visit Diagnoses:  1. Status post total left knee replacement   2. Primary osteoarthritis of right knee     Plan: [name] is a 67 year old female who is 1 year postop from a left total knee replacement and also bone-on-bone end-stage DJD of the right knee.  In regards to the left knee she is very happy and has no complaints.  She would like to look at doing the right knee replacement later this year.  Eunice Blase will call the patient to confirm surgery time.  Impression is severe right knee degenerative joint disease secondary to Osteoarthritis.  Bone on bone joint space narrowing is seen on radiographs with mild varus alignment.  At this point, conservative treatments fail to provide any significant relief and the pain is severely affecting ADLs and quality of life.  Based on treatment options, the patient has elected to move forward with a knee replacement.  We have discussed the surgical risks that include but are not limited to infection, DVT, leg length discrepancy, stiffness, numbness, tingling, incomplete relief of pain.  Recovery and prognosis were also reviewed.    Anticoagulants: No antithrombotic Postop anticoagulation: Xarelto Diabetic: No  Nickel allergy: No Prior DVT/PE: No Tobacco use: No Clearances needed for surgery: None Anticipated discharge dispo: Home   Follow-Up Instructions: No follow-ups on file.   Orders:  Orders Placed This Encounter  Procedures   XR Knee 1-2 Views Left   No orders of the defined types were placed in this encounter.     Procedures: No procedures performed   Clinical Data: No additional findings.   Subjective: Chief Complaint  Patient presents with   Left Knee -  Follow-up    Left total knee arthroplasty 11/18/2021    HPI Patient is a 67 year old female who is 1 year postop from a left total knee replacement.  She is doing very well and has been very pleased with the outcome.  She has felt a significant improvement in pain in her left knee and she is eager to have the right knee replaced later this year.  She is currently walking a mile and a half for exercise.  The right knee causes her pain at night and with ADLs.  Review of Systems  Constitutional: Negative.   HENT: Negative.    Eyes: Negative.   Respiratory: Negative.    Cardiovascular: Negative.   Endocrine: Negative.   Musculoskeletal: Negative.   Neurological: Negative.   Hematological: Negative.   Psychiatric/Behavioral: Negative.    All other systems reviewed and are negative.    Objective: Vital Signs: There were no vitals taken for this visit.  Physical Exam Vitals and nursing note reviewed.  Constitutional:      Appearance: She is well-developed.  HENT:     Head: Atraumatic.     Nose: Nose normal.  Eyes:     Extraocular Movements: Extraocular movements intact.  Cardiovascular:     Pulses: Normal pulses.  Pulmonary:     Effort: Pulmonary effort is normal.  Abdominal:     Palpations: Abdomen is soft.  Musculoskeletal:  Cervical back: Neck supple.  Skin:    General: Skin is warm.     Capillary Refill: Capillary refill takes less than 2 seconds.  Neurological:     Mental Status: She is alert. Mental status is at baseline.  Psychiatric:        Behavior: Behavior normal.        Thought Content: Thought content normal.        Judgment: Judgment normal.     Ortho Exam Examination of the left knee shows fully healed surgical scar with excellent range of motion.  Collaterals are stable. Examination of the right knee shows medial and lateral joint line tenderness with 2+ patellofemoral crepitus with range of motion which is very painful.  Collaterals are  stable. Specialty Comments:  MRI LUMBAR SPINE WITHOUT CONTRAST   TECHNIQUE: Multiplanar, multisequence MR imaging of the lumbar spine was performed. No intravenous contrast was administered.   COMPARISON:  None available.   FINDINGS: Segmentation: Standard. Lowest well-formed disc space labeled the L5-S1 level.   Alignment: Underlying mild sigmoid scoliosis. 2 mm retrolisthesis of T11 on T12. 4 mm anterolisthesis of L3 on L4, with 5 mm anterolisthesis of L4 on L5. Findings chronic and facet mediated.   Vertebrae: Vertebral body height maintained without acute or chronic fracture. Bone marrow signal intensity within normal limits. No discrete or worrisome osseous lesions. No abnormal marrow edema.   Conus medullaris and cauda equina: Conus extends to the L1 level. Conus and cauda equina appear normal.   Paraspinal and other soft tissues: Paraspinous soft tissues within normal limits. Probable fibroid uterus partially visualized. Visualized visceral structures otherwise unremarkable.   Disc levels:   T11-12: Seen only on sagittal projection. 2 mm retrolisthesis. Degenerative intervertebral disc space narrowing with diffuse disc bulge and disc desiccation. Mild bilateral facet hypertrophy. No significant spinal stenosis. Mild right foraminal narrowing. Left neural foramina remains patent.   T12-L1: Unremarkable.   L1-2: Normal interspace. Mild bilateral facet hypertrophy. No canal or foraminal stenosis.   L2-3: Moderate degenerative intervertebral disc space narrowing with diffuse disc bulge and disc desiccation. Associated reactive endplate spurring. Superimposed small left subarticular disc protrusion with inferior migration (series 3, image 9). Additional right foraminal to extraforaminal disc protrusion with slight superior migration present at this level as well (series 6, image 16). Mild to moderate facet and ligament flavum hypertrophy. Resultant mild canal with  left lateral recess stenosis. Moderate right with mild to moderate left L2 foraminal narrowing.   L3-4: 4 mm anterolisthesis. Degenerative intervertebral disc space narrowing with diffuse disc bulge, asymmetric to the left. Associated discogenic reactive endplate change with marginal endplate osteophytic spurring. Moderate left worse than right facet arthrosis. Resultant moderate canal and right lateral recess stenosis, with more severe narrowing of the left lateral recess. Moderate right with severe left L3 foraminal stenosis.   L4-5: 5 mm anterolisthesis. Mild diffuse disc bulge with disc desiccation, asymmetric to the left. Moderate facet and ligament flavum hypertrophy. Resultant moderate to severe canal with bilateral subarticular stenosis, worse on the left. Moderate bilateral L4 foraminal narrowing.   L5-S1: Degenerative intervertebral disc space narrowing with diffuse disc bulge and disc desiccation. Reactive endplate spurring. Superimposed broad-based left subarticular disc protrusion closely approximates and/or contacts the descending left S1 nerve root (series 6, image 34). Mild to moderate bilateral facet hypertrophy. Resultant mild narrowing of the left lateral recess. Central canal remains patent. Moderate left worse than right L5 foraminal stenosis.   IMPRESSION: 1. Multifactorial degenerative changes at L3-4 and L4-5  with resultant moderate to severe canal and bilateral subarticular stenosis, with moderate to severe bilateral L3 and L4 foraminal narrowing as above. 2. Broad-based left subarticular disc protrusion at L5-S1, potentially affecting the descending left S1 nerve root. 3. Right foraminal to extraforaminal disc protrusion at L2-3 with resultant moderate right L2 foraminal stenosis. 4. Additional small left subarticular disc protrusion at L2-3, potentially affecting the descending left L3 nerve root.     Electronically Signed   By: Rise Mu  M.D.   On: 07/16/2021 04:08  Imaging: No results found.   PMFS History: Patient Active Problem List   Diagnosis Date Noted   Sepsis (HCC) 04/24/2022   Primary osteoarthritis of right knee 02/11/2022   Status post total left knee replacement 11/18/2021   Primary osteoarthritis of left knee 08/06/2021   Aortic atherosclerosis (HCC) 04/24/2020   Deep venous thrombosis of left upper extremity (HCC) 04/24/2020   Port-A-Cath in place 03/26/2020   Malignant neoplasm of upper-outer quadrant of right breast in female, estrogen receptor positive (HCC) 02/13/2020   Past Medical History:  Diagnosis Date   Arthritis    Breast cancer (HCC)    Cancer (HCC)    breast   Diabetes mellitus without complication (HCC)    History of MRI    Knee, Hip, and Back.   Hypertension    Personal history of chemotherapy    Personal history of radiation therapy     Family History  Problem Relation Age of Onset   Asthma Mother    Arthritis Sister    Asthma Brother    Arthritis Brother     Past Surgical History:  Procedure Laterality Date   BREAST BIOPSY Right 02/03/2020   x2   BREAST BIOPSY Right 02/06/2020   x2   BREAST LUMPECTOMY Right 07/26/2020   BREAST LUMPECTOMY WITH RADIOACTIVE SEED AND SENTINEL LYMPH NODE BIOPSY Right 07/26/2020   Procedure: RIGHT BREAST LUMPECTOMY WITH RADIOACTIVE SEED X 2 AND SENTINEL LYMPH NODE BIOPSY;  Surgeon: Harriette Bouillon, MD;  Location: [facility];  Service: General;  Laterality: Right;   IR IMAGING GUIDED PORT INSERTION  02/23/2020   PORT-A-CATH REMOVAL N/A 07/26/2020   Procedure: REMOVAL PORT-A-CATH;  Surgeon: Harriette Bouillon, MD;  Location: [facility];  Service: General;  Laterality: N/A;   TOTAL KNEE ARTHROPLASTY Left 11/18/2021   Procedure: LEFT TOTAL KNEE ARTHROPLASTY;  Surgeon: [name], MD;  Location: [facility];  Service: Orthopedics;  Laterality: Left;   Social History   Occupational History   Not on file  Tobacco Use    Smoking status: Never   Smokeless tobacco: Never  Vaping Use   Vaping Use: Never used  Substance and Sexual Activity   Alcohol use: Yes    Alcohol/week: 1.0 standard drink of alcohol    Types: 1 Glasses of wine per week    Comment: occasionally   Drug use: Never   Sexual activity: Not Currently

## 2022-11-11 ENCOUNTER — Other Ambulatory Visit (INDEPENDENT_AMBULATORY_CARE_PROVIDER_SITE_OTHER): Payer: Medicaid Other

## 2022-11-11 ENCOUNTER — Other Ambulatory Visit (HOSPITAL_COMMUNITY): Payer: Self-pay

## 2022-11-11 ENCOUNTER — Ambulatory Visit (INDEPENDENT_AMBULATORY_CARE_PROVIDER_SITE_OTHER): Payer: Medicaid Other | Admitting: Orthopaedic Surgery

## 2022-11-11 ENCOUNTER — Encounter: Payer: Self-pay | Admitting: Orthopaedic Surgery

## 2022-11-11 DIAGNOSIS — M1711 Unilateral primary osteoarthritis, right knee: Secondary | ICD-10-CM | POA: Diagnosis not present

## 2022-11-11 DIAGNOSIS — Z96652 Presence of left artificial knee joint: Secondary | ICD-10-CM | POA: Diagnosis not present

## 2022-11-26 ENCOUNTER — Inpatient Hospital Stay: Payer: Medicaid Other | Attending: Hematology and Oncology | Admitting: Hematology and Oncology

## 2022-11-26 DIAGNOSIS — Z170 Estrogen receptor positive status [ER+]: Secondary | ICD-10-CM

## 2022-11-26 DIAGNOSIS — C50411 Malignant neoplasm of upper-outer quadrant of right female breast: Secondary | ICD-10-CM

## 2022-11-26 NOTE — Progress Notes (Signed)
[facility]  Telephone:(336) [phone] Fax:(336) [phone]     ID: [name] [name] DOB: Mar 11, 1956  MR#: [id number]  MWU#:[id number]  Patient Care Team: Caesar Bookman, NP as PCP - General (Family Medicine) Pershing Proud, RN as Oncology Nurse Navigator Donnelly Angelica, RN as Oncology Nurse Navigator Harriette Bouillon, MD as Consulting Physician (General Surgery) Lonie Peak, MD as Attending Physician (Radiation Oncology) Rachel Moulds, MD as Medical Oncologist (Hematology and Oncology) Anselm Lis, RPH-CPP as Pharmacist (Hematology and Oncology) Rachel Moulds, MD   CHIEF COMPLAINT: Estrogen receptor positive breast cancer  CURRENT TREATMENT: Anastrozole   INTERVAL HISTORY:  [name] [name] returns today for follow up of her estrogen receptor positive breast cancer. She is on anastrozole alone for adjuvant anti estrogen therapy.  This was a follow up phone call today to see if she was able to see the dentist. She apparently had a follow-up visit with her dentist and she was recommended to consider exam and plans.  She has some follow-up appointments.  She is taking anastrozole as prescribed.  She denies any new issues.  She wants to wait for her dental work to be done before considering any form of bisphosphonates which is very reasonable.  No other concerns expressed today.   COVID 19 VACCINATION STATUS: s/p  [employer]  X2, most recently June 2021, no booster as of 05/01/2020   HISTORY OF CURRENT ILLNESS: From the original intake note:  [name] [name] herself palpated a mass in the right axilla "a long time ago", more recently with complaints of associated pain. She brought it to medical attention and underwent bilateral diagnostic mammography with tomography and bilateral breast ultrasonography at [facility] on 01/31/2020 showing: breast density category B; three right breast masses-- 1.9 cm at 12 o'clock, 2.3 cm at 11 o'clock, 0.4 cm at 1 o'clock; two adjacent  enlarged thickened lymph nodes measuring up to 2.1 cm; no evidence of left breast malignancy.  Accordingly on 02/03/2020 she proceeded to biopsy of the right breast area at 12 o'clock and right axilla. The pathology from this procedure ([id number]) showed: invasive ductal carcinoma, grade 3. Prognostic indicators significant for: estrogen receptor, 100% positive with strong staining intensity and progesterone receptor, 0% negative. Proliferation marker Ki67 at 60%. HER2 equivocal by immunohistochemistry (2+), but negative by fluorescent in situ hybridization with a signals ratio 0.81 and number per cell 2.7.  The biopsied lymph node was found to show metastatic carcinoma. The morphology was considered different from the biopsied mass, and a prognostic panel was performed. Estrogen receptor, 100% positive with strong staining intensity and progesterone receptor, 0% negative. Proliferation marker Ki67 at 70%. HER2 equivcoal by immunohistochemistry (2+), but negative) by fluorescent in situ hybridization with a signals ratio 1.16 and number per cell 3.9.  She underwent additional right breast biopsies on 02/06/2020. Pathology [id number]) from the mass at 11 o'clock showed: invasive ductal carcinoma, grade 2. Prognostic indicators significant for: estrogen receptor, 100% positive with strong staining intensity and progesterone receptor, 0% negative. Proliferation marker Ki67 at 15%. HER2 equivocal by immunohistochemistry (2+), but negative by fluorescent in situ hybridization with a signals ratio 1.29 and number per cell 2.7.  The biopsied mass at 1 o'clock showed only fibrocystic change.  The patient's subsequent history is as detailed below.   PAST MEDICAL HISTORY: Past Medical History:  Diagnosis Date   Arthritis    Breast cancer (HCC)    Cancer (HCC)    breast   Diabetes mellitus without complication (HCC)  History of MRI    Knee, Hip, and Back.   Hypertension    Personal history of  chemotherapy    Personal history of radiation therapy     PAST SURGICAL HISTORY: Past Surgical History:  Procedure Laterality Date   BREAST BIOPSY Right 02/03/2020   x2   BREAST BIOPSY Right 02/06/2020   x2   BREAST LUMPECTOMY Right 07/26/2020   BREAST LUMPECTOMY WITH RADIOACTIVE SEED AND SENTINEL LYMPH NODE BIOPSY Right 07/26/2020   Procedure: RIGHT BREAST LUMPECTOMY WITH RADIOACTIVE SEED X 2 AND SENTINEL LYMPH NODE BIOPSY;  Surgeon: Harriette Bouillon, MD;  Location: [facility];  Service: General;  Laterality: Right;   IR IMAGING GUIDED PORT INSERTION  02/23/2020   PORT-A-CATH REMOVAL N/A 07/26/2020   Procedure: REMOVAL PORT-A-CATH;  Surgeon: Harriette Bouillon, MD;  Location: [facility];  Service: General;  Laterality: N/A;   TOTAL KNEE ARTHROPLASTY Left 11/18/2021   Procedure: LEFT TOTAL KNEE ARTHROPLASTY;  Surgeon: [name], MD;  Location: [facility];  Service: Orthopedics;  Laterality: Left;    FAMILY HISTORY: Family History  Problem Relation Age of Onset   Asthma Mother    Arthritis Sister    Asthma Brother    Arthritis Brother   [name] died in his 53s from causes unknown to the patient.  [name] died from heart disease at age 32.  The patient had 4 brothers, 1 sister, with no history of cancer in the family to her knowledge   GYNECOLOGIC HISTORY:  No LMP recorded. Patient is postmenopausal. Menarche: 67 years old Age at first live birth: 67 years old GX P 1 LMP 79s Contraceptive HRT no  Hysterectomy? no BSO? no   SOCIAL HISTORY: (updated 02/2020)  [name] [name] is originally from [place].  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  She describes herself as single.  At home she lives with her daughter [name] [name] who works for [employer], her daughters' husband [name] [name] who is a Insurance underwriter, and their children who are 2 months old and 15 years old.  The patient is [place]    ADVANCED DIRECTIVES: Not in  place.  The patient tells me she would intend to name her daughter [name] [name] as her healthcare power of attorney   HEALTH MAINTENANCE: Social History   Tobacco Use   Smoking status: Never   Smokeless tobacco: Never  Vaping Use   Vaping status: Never Used  Substance Use Topics   Alcohol use: Yes    Alcohol/week: 1.0 standard drink of alcohol    Types: 1 Glasses of wine per week    Comment: occasionally   Drug use: Never     Colonoscopy: never done  PAP: 2017 (performed in [place])  Bone density:    Allergies  Allergen Reactions   Sulfa Antibiotics Itching    Current Outpatient Medications  Medication Sig Dispense Refill   amoxicillin (AMOXIL) 500 MG tablet Take 4 tablets by mouth 1 hour prior to dental procedure. 8 tablet 0   anastrozole (ARIMIDEX) 1 MG tablet Take 1 tablet (1 mg total) by mouth daily. 90 tablet 4   atorvastatin (LIPITOR) 40 MG tablet Take 1 tablet (40 mg total) by mouth daily. 90 tablet 1   calcium-vitamin D (OSCAL WITH D) 500-5 MG-MCG tablet Take 1 tablet by mouth daily. Will bring in dosing to next visit     carvedilol (COREG) 6.25 MG tablet Take 1 tablet (6.25 mg total) by mouth 2 (  two) times daily with a meal. 180 tablet 1   diclofenac (VOLTAREN) 75 MG EC tablet Take 1 tablet (75 mg total) by mouth 2 (two) times daily. 60 tablet 5   lansoprazole (PREVACID) 30 MG capsule Take 1 capsule (30 mg total) by mouth every morning. 90 capsule 0   Magnesium 100 MG CAPS Take 100 mg by mouth daily.     metFORMIN (GLUCOPHAGE) 1000 MG tablet Take 1 tablet (1,000 mg total) by mouth 2 (two) times daily with a meal. 180 tablet 0   Multiple Vitamins-Minerals (CENTRUM SILVER 50+WOMEN PO) Take 1 capsule by mouth daily.     UNABLE TO FIND Med Name: [place]. Pt reports taking 1 scoopful in tea or water     valsartan (DIOVAN) 320 MG tablet Take 1 tablet (320 mg total) by mouth daily. PATIENT NEEDS APPOINTMENT 30 tablet 0   vitamin C (ASCORBIC ACID) 500 MG tablet Take 500 mg  by mouth daily.     No current facility-administered medications for this visit.    OBJECTIVE: African-American woman in no acute distress  There were no vitals filed for this visit.       There is no height or weight on file to calculate BMI.   Wt Readings from Last 3 Encounters:  10/16/22 201 lb 12.8 oz (91.5 kg)  07/25/22 203 lb 12.8 oz (92.4 kg)  06/12/22 199 lb 8 oz (90.5 kg)     ECOG FS:1 - Symptomatic but completely ambulatory  Physical examination not done, telephone visit LAB RESULTS:  CMP     Component Value Date/Time   NA 139 10/16/2022 1532   K 4.4 10/16/2022 1532   CL 106 10/16/2022 1532   CO2 29 10/16/2022 1532   GLUCOSE 133 (H) 10/16/2022 1532   BUN 12 10/16/2022 1532   CREATININE 0.79 10/16/2022 1532   CREATININE 0.75 07/21/2022 0820   CALCIUM 9.3 10/16/2022 1532   PROT 6.5 10/16/2022 1532   ALBUMIN 4.0 10/16/2022 1532   AST 25 10/16/2022 1532   ALT 22 10/16/2022 1532   ALKPHOS 92 10/16/2022 1532   BILITOT 0.3 10/16/2022 1532   GFRNONAA >60 10/16/2022 1532    No results found for: "TOTALPROTELP", "ALBUMINELP", "A1GS", "A2GS", "BETS", "BETA2SER", "GAMS", "MSPIKE", "SPEI"  Lab Results  Component Value Date   WBC 3.1 (L) 10/16/2022   NEUTROABS 2.0 10/16/2022   HGB 10.0 (L) 10/16/2022   HCT 30.9 (L) 10/16/2022   MCV 93.6 10/16/2022   PLT 192 10/16/2022    No results found for: "LABCA2"  No components found for: "WUJWJX914"  No results for input(s): "INR" in the last 168 hours.  No results found for: "LABCA2"  No results found for: "NWG956"  No results found for: "CAN125"  No results found for: "CAN153"  No results found for: "CA2729"  No components found for: "HGQUANT"  No results found for: "CEA1", "CEA" / No results found for: "CEA1", "CEA"   No results found for: "AFPTUMOR"  No results found for: "CHROMOGRNA"  No results found for: "KPAFRELGTCHN", "LAMBDASER", "KAPLAMBRATIO" (kappa/lambda light chains)  No results found  for: "HGBA", "HGBA2QUANT", "HGBFQUANT", "HGBSQUAN" (Hemoglobinopathy evaluation)   No results found for: "LDH"  No results found for: "IRON", "TIBC", "IRONPCTSAT" (Iron and TIBC)  No results found for: "FERRITIN"  Urinalysis    Component Value Date/Time   COLORURINE YELLOW 04/24/2022 0254   APPEARANCEUR CLEAR 04/24/2022 0254   LABSPEC 1.011 04/24/2022 0254   PHURINE 5.0 04/24/2022 0254   GLUCOSEU NEGATIVE 04/24/2022 0254  HGBUR NEGATIVE 04/24/2022 0254   BILIRUBINUR NEGATIVE 04/24/2022 0254   KETONESUR NEGATIVE 04/24/2022 0254   PROTEINUR NEGATIVE 04/24/2022 0254   NITRITE NEGATIVE 04/24/2022 0254   LEUKOCYTESUR NEGATIVE 04/24/2022 0254     STUDIES: XR Knee 1-2 Views Left  Result Date: 11/11/2022 Stable left total knee replacement in good alignment.   XR KNEE 3 VIEW RIGHT  Result Date: 11/11/2022 X-rays demonstrate severe osteoarthritis.  Bone-on-bone joint space narrowing of all 3 compartments.  Multiple calcified loose bodies.  Slight varus deformity.    ELIGIBLE FOR AVAILABLE RESEARCH PROTOCOL: AET  ASSESSMENT: 67 y.o. [place] woman status post right breast upper outer quadrant (12:00) and axillary lymph node biopsy 02/03/2020, both positive for a clinical T1c N1, stage IIB invasive ductal carcinoma, grade 3, estrogen receptor positive, HER-2 and progesterone receptor negative, with an MIB-1 of 60%.  (a) biopsy of a second right upper outer quadrant T2 lesion (11 o'clock) 02/06/2020 also showed invasive ductal carcinoma, but grade 2, estrogen receptor positive, progesterone receptor and HER-2 negative, with an MIB-1 of 15%  (b) biopsy of a third right breast lesion (1:00) was benign   (1) neoadjuvant chemotherapy consisting of cyclophosphamide and docetaxel every 21 days x 4 starting 03/05/2020  (a) discontinued after 2 cycles because of peripheral neuropathy  (b) cyclophosphamide/doxorubicin started 04/24/2020, repeated every 21 days x4, completed 06/25/2020  (c)  echo 04/17/2020 showed EF of 55-60%  (2) right lumpectomy 07/26/2020 found a residual pT1c pN1 invasive ductal carcinoma, grade 2, with negative margins  (a) repeat prognostic indicators show the tumor to be estrogen receptor positive, progesterone receptor and HER2 negative, with an MIB-1 of 2%.  (b) a total of 6 right axillary lymph nodes removed, 2 positive  (3) adjuvant radiation completed 10/17/2020  (4) anastrozole started June 2022  (5) CT scan of the chest 04/23/2020 shows a left brachiocephalicl catheter associated clot  (a) rivaroxaban started 04/24/2020, discontinued 08/22/2020  (b) port removed 07/26/2020  (6) Started abemaciclib in March 2023, took it till Dec 2023, stopped it since then.  Last MRI breast Jan 2023 showed no evidence of breast malignancy. Post surgical collection right breast near right nipple which could have been causing the drainage.  Mammogram in Nov with stable lumpectomy changes, recommendation was to repeat mammogram in 1 year. She is here for follow-up abemaciclib and anastrozole however she hasn't been taking it for several months now, stopped in Dec 2023.  She is now on anastrozole alone which is reasonable.  She is tolerating this extremely well.  Since her last visit I have asked her to see a dentist for dental clearance to be considered for management of her bone density.  She apparently is anticipating some procedures including implants.  She will keep [place] posted of when she will be done with these procedures.  Until then I have recommended that she continue weightbearing exercises, vitamin D and calcium supplementation as tolerated and anastrozole.  She is scheduled for mammogram in November.  Will plan to call her back in about 8 weeks.   I connected with  [name] [name] on 11/26/22 by a telephone application and verified that I am speaking with the correct person using two identifiers.   I discussed the limitations of evaluation and management by  telemedicine. The patient expressed understanding and agreed to proceed.  Total time: 10 min *Total Encounter Time as defined by the Centers for Medicare and Medicaid Services includes, in addition to the face-to-face time of a patient visit (documented in the  note above) non-face-to-face time: obtaining and reviewing outside history, ordering and reviewing medications, tests or procedures, care coordination (communications with other health care professionals or caregivers) and documentation in the medical record.

## 2022-11-27 ENCOUNTER — Telehealth: Payer: Self-pay | Admitting: Hematology and Oncology

## 2022-11-27 NOTE — Telephone Encounter (Signed)
Spoke with patient daughter confirming upcoming appointment 

## 2023-01-05 ENCOUNTER — Other Ambulatory Visit (HOSPITAL_COMMUNITY): Payer: Self-pay

## 2023-01-06 ENCOUNTER — Encounter: Payer: Self-pay | Admitting: Family

## 2023-01-06 ENCOUNTER — Ambulatory Visit (INDEPENDENT_AMBULATORY_CARE_PROVIDER_SITE_OTHER): Payer: Medicaid Other | Admitting: Family

## 2023-01-06 VITALS — BP 119/78 | HR 67 | Temp 98.5°F | Ht 67.5 in | Wt 206.4 lb

## 2023-01-06 DIAGNOSIS — I959 Hypotension, unspecified: Secondary | ICD-10-CM | POA: Diagnosis not present

## 2023-01-06 DIAGNOSIS — Z7689 Persons encountering health services in other specified circumstances: Secondary | ICD-10-CM | POA: Diagnosis not present

## 2023-01-06 DIAGNOSIS — Z0100 Encounter for examination of eyes and vision without abnormal findings: Secondary | ICD-10-CM

## 2023-01-06 DIAGNOSIS — E1165 Type 2 diabetes mellitus with hyperglycemia: Secondary | ICD-10-CM | POA: Diagnosis not present

## 2023-01-06 DIAGNOSIS — I10 Essential (primary) hypertension: Secondary | ICD-10-CM | POA: Diagnosis not present

## 2023-01-06 DIAGNOSIS — E785 Hyperlipidemia, unspecified: Secondary | ICD-10-CM

## 2023-01-06 DIAGNOSIS — E119 Type 2 diabetes mellitus without complications: Secondary | ICD-10-CM

## 2023-01-06 DIAGNOSIS — G4700 Insomnia, unspecified: Secondary | ICD-10-CM

## 2023-01-06 DIAGNOSIS — Z7984 Long term (current) use of oral hypoglycemic drugs: Secondary | ICD-10-CM

## 2023-01-06 LAB — POCT GLYCOSYLATED HEMOGLOBIN (HGB A1C): HbA1c, POC (controlled diabetic range): 6.1 % (ref 0.0–7.0)

## 2023-01-06 MED ORDER — METFORMIN HCL 1000 MG PO TABS
1000.0000 mg | ORAL_TABLET | Freq: Two times a day (BID) | ORAL | 0 refills | Status: DC
Start: 2023-01-06 — End: 2024-01-27

## 2023-01-06 MED ORDER — VALSARTAN 320 MG PO TABS: 320.0000 mg | ORAL_TABLET | Freq: Every day | ORAL | 0 refills | Status: DC

## 2023-01-06 MED ORDER — TRAZODONE HCL 50 MG PO TABS
50.0000 mg | ORAL_TABLET | Freq: Every day | ORAL | 0 refills | Status: DC
Start: 2023-01-06 — End: 2023-03-06

## 2023-01-06 NOTE — Progress Notes (Signed)
Subjective:    [name] [name] - 67 y.o. female MRN [id number]  Date of birth: 1956-01-20  HPI  [name] [name] is to establish care.   Current issues and/or concerns: - Taking Valsartan and Carvedilol for high blood pressure. Reports sometimes has low blood pressures at home 80'/40's and wants to know why. She does not complain of red flag symptoms such as but not limited to chest pain, shortness of breath, worst headache of life, nausea/vomiting.  - Doing well on Atorvastatin, no issues/concerns. - Doing well on Metformin, no issues/concerns. She denies red flag symptoms associated with diabetes.  - Insomnia. Reports over-the-counter Tylenol PM causing upset stomach. - Established with Oncology.  - Established with Orthopedics. - No further issues/concerns for discussion today.   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 She has never used smokeless tobacco. She reports current alcohol use of about 1.0 standard drink of alcohol per week. She reports that she does not use drugs.   Family History  family history includes Arthritis in her brother and sister; Asthma in her brother and mother.   Medications:  reviewed and updated   Objective:   Physical Exam BP 119/78   Pulse 67   Temp 98.5 F (36.9 C) (Oral)   Ht 5' 7.5" (1.715 m)   Wt 206 lb 6.4 oz (93.6 kg)   SpO2 95%   BMI 31.85 kg/m   Physical Exam HENT:     Head: Normocephalic and atraumatic.     Nose: Nose normal.     Mouth/Throat:     Mouth: Mucous membranes are moist.     Pharynx: Oropharynx is clear.  Eyes:     Extraocular Movements: Extraocular movements intact.     Conjunctiva/sclera: Conjunctivae normal.     Pupils: Pupils are equal, round, and reactive to light.  Cardiovascular:     Rate and Rhythm: Normal rate and regular rhythm.     Pulses: Normal pulses.     Heart sounds: Normal heart sounds.  Pulmonary:     Effort: Pulmonary effort is normal.     Breath sounds: Normal breath sounds.  Musculoskeletal:        General: Normal range of motion.     Cervical back: Normal range of motion and neck supple.  Neurological:     General: No focal deficit present.     Mental Status: She is alert and oriented to person, place, and time.  Psychiatric:        Mood and Affect: Mood normal.        Behavior: Behavior normal.       Results for orders placed or performed in visit on 01/06/23  POCT glycosylated hemoglobin (Hb A1C)  Result Value Ref Range   Hemoglobin A1C     HbA1c POC (<> result, manual entry)     HbA1c, POC (prediabetic range)     HbA1c, POC (controlled diabetic range) 6.1 0.0 - 7.0 %    Assessment & Plan:  1. Encounter to establish care - Patient presents today to establish care. During the interim follow-up with primary provider as scheduled.  - Return for annual physical examination, labs, and health maintenance. Arrive fasting meaning having no food for at least 8 hours prior to appointment. You may have only water or black coffee. Please take scheduled medications as normal.  2. Primary hypertension 3. Hypotension, unspecified hypotension type - Continue Carvedilol as prescribed. No refills  needed as of present.  - Continue Valsartan as prescribed.  - Counseled on blood pressure goal of less than 130/80, low-sodium, DASH diet, medication compliance, and 150 minutes of moderate intensity exercise per week as tolerated. Counseled on medication adherence and adverse effects. - Referral to Cardiology for further evaluation/management. During the interim follow-up with primary provider as scheduled. - Ambulatory referral to Cardiology - valsartan (DIOVAN) 320 MG tablet; Take 1 tablet (320 mg total) by mouth daily.  Dispense: 90 tablet; Refill: 0  4. Hyperlipidemia, unspecified hyperlipidemia type - Continue Atorvastatin as prescribed. No refills needed as of present.  - Referral to Cardiology for further evaluation/management. During the interm follow-up with primary provider as scheduled. - Ambulatory referral to Cardiology  5. Type 2 diabetes mellitus with hyperglycemia, without long-term current use of insulin (HCC) - Hemoglobin A1c at goal at 6.1%, goal 7%. This is increased compared to previous 5.7%. - Continue Metformin as prescribed. - Discussed the importance of healthy eating habits, low-carbohydrate diet, low-sugar diet, regular aerobic exercise (at least 150 minutes a week as tolerated) and medication compliance to achieve or maintain control of diabetes. - Follow-up with primary provider in 3 months or sooner if needed.  - POCT glycosylated hemoglobin (Hb A1C) - metFORMIN (GLUCOPHAGE) 1000 MG tablet; Take 1 tablet (1,000 mg total) by mouth 2 (two) times daily with a meal.  Dispense: 180 tablet; Refill: 0  6. Diabetic eye exam [facility]) - Referral to Ophthalmology for further evaluation/management. - Ambulatory referral to Ophthalmology  7. Encounter for diabetic foot exam [facility]) - Referral to Podiatry for further evaluation/management. - Ambulatory referral to Podiatry  8. Insomnia, unspecified type - Trial Trazodone as prescribed. Counseled on medication  adherence/adverse effects.  - Follow-up with primary provider in 4 weeks or sooner if needed.  - traZODone (DESYREL) 50 MG tablet; Take 1 tablet (50 mg total) by mouth at bedtime.  Dispense: 30 tablet; Refill: 0     Patient was given clear instructions to go to Emergency Department or return to medical center if symptoms don't improve, worsen, or new problems develop.The patient verbalized understanding.  I discussed the assessment and treatment plan with the patient. The patient was provided an opportunity to ask questions and all were answered. The patient agreed with the plan and demonstrated an understanding of the instructions.   The patient was advised to call back or seek an in-person evaluation if the symptoms worsen or if the condition fails to improve as anticipated.    Ricky Stabs, NP 01/06/2023, 9:39 AM Primary Care at [facility]

## 2023-01-06 NOTE — Patient Instructions (Signed)
Thank you for choosing Primary Care at [facility] for your medical home!    [name] [name] was seen by Rema Fendt, NP today.   Tera Helper primary care provider is Ricky Stabs, NP.   For the best care possible,  you should try to see Ricky Stabs, NP whenever you come to office.   We look forward to seeing you again soon!  If you have any questions about your visit today,  please call us at [phone]  Or feel free to reach your provider via MyChart.   Keeping you healthy   Get these tests Blood pressure- Have your blood pressure checked once a year by your healthcare provider.  Normal blood pressure is 120/80. Weight- Have your body mass index (BMI) calculated to screen for obesity.  BMI is a measure of body fat based on height and weight. You can also calculate your own BMI at https://www.west-esparza.com/. Cholesterol- Have your cholesterol checked regularly starting at age 28, sooner may be necessary if you have diabetes, high blood pressure, if a family member developed heart diseases at an early age or if you smoke.  Chlamydia, HIV, and other sexual transmitted disease- Get screened each year until the age of 85 then within three months of each new sexual partner. Diabetes- Have your blood sugar checked regularly if you have high blood pressure, high cholesterol, a family history of diabetes or if you are overweight.   Get these vaccines Flu shot- Every fall. Tetanus shot- Every 10 years. Menactra- Single dose; prevents meningitis.   Take these steps Don't smoke- If you do smoke, ask your healthcare provider about quitting. For tips on how to quit, go to www.smokefree.gov or call 1-800-QUIT-NOW. Be physically active- Exercise 5 days a week for at least 30 minutes.  If you are not already physically active start slow and gradually work up to 30 minutes of moderate physical activity.  Examples of moderate activity include walking briskly, mowing the yard, dancing, swimming  bicycling, etc. Eat a healthy diet- Eat a variety of healthy foods such as fruits, vegetables, low fat milk, low fat cheese, yogurt, lean meats, poultry, fish, beans, tofu, etc.  For more information on healthy eating, go to www.thenutritionsource.org Drink alcohol in moderation- Limit alcohol intake two drinks or less a day.  Never drink and drive. Dentist- Brush and floss teeth twice daily; visit your dentis twice a year. Depression-Your emotional health is as important as your physical health.  If you're feeling down, losing interest in things you normally enjoy please talk with your healthcare provider. Gun Safety- If you keep a gun in your home, keep it unloaded and with the safety lock on.  Bullets should be stored separately. Helmet use- Always wear a helmet when riding a motorcycle, bicycle, rollerblading or skateboarding. Safe sex- If you may be exposed to a sexually transmitted infection, use a condom Seat belts- Seat bels can save your life; always wear one. Smoke/Carbon Monoxide detectors- These detectors need to be installed on the appropriate level of your home.  Replace batteries at least once a year. Skin Cancer- When out in the sun, cover up and use sunscreen SPF 15 or higher. Violence- If anyone is threatening or hurting you, please tell your healthcare provider.

## 2023-01-06 NOTE — Progress Notes (Signed)
ASPt wants to speak about problems falling asleep.

## 2023-01-07 ENCOUNTER — Encounter: Payer: Self-pay | Admitting: Family

## 2023-01-07 ENCOUNTER — Other Ambulatory Visit (HOSPITAL_COMMUNITY): Payer: Self-pay

## 2023-01-09 NOTE — Telephone Encounter (Signed)
I went ahead and changed hi PCP to Amy Zonia Kief in his MyChart.

## 2023-01-19 ENCOUNTER — Ambulatory Visit (INDEPENDENT_AMBULATORY_CARE_PROVIDER_SITE_OTHER): Payer: Medicaid Other | Admitting: Podiatry

## 2023-01-19 ENCOUNTER — Encounter: Payer: Self-pay | Admitting: Podiatry

## 2023-01-19 ENCOUNTER — Encounter: Payer: Self-pay | Admitting: Family

## 2023-01-19 ENCOUNTER — Other Ambulatory Visit: Payer: Self-pay

## 2023-01-19 DIAGNOSIS — E1142 Type 2 diabetes mellitus with diabetic polyneuropathy: Secondary | ICD-10-CM

## 2023-01-19 DIAGNOSIS — M79674 Pain in right toe(s): Secondary | ICD-10-CM | POA: Diagnosis not present

## 2023-01-19 DIAGNOSIS — M79675 Pain in left toe(s): Secondary | ICD-10-CM | POA: Diagnosis not present

## 2023-01-19 DIAGNOSIS — G8929 Other chronic pain: Secondary | ICD-10-CM

## 2023-01-19 DIAGNOSIS — K219 Gastro-esophageal reflux disease without esophagitis: Secondary | ICD-10-CM

## 2023-01-19 DIAGNOSIS — B351 Tinea unguium: Secondary | ICD-10-CM

## 2023-01-19 DIAGNOSIS — M159 Polyosteoarthritis, unspecified: Secondary | ICD-10-CM

## 2023-01-19 MED ORDER — DICLOFENAC SODIUM 75 MG PO TBEC
75.0000 mg | DELAYED_RELEASE_TABLET | Freq: Two times a day (BID) | ORAL | 5 refills | Status: DC
Start: 2023-01-19 — End: 2023-04-14

## 2023-01-19 MED ORDER — LANSOPRAZOLE 30 MG PO CPDR
30.0000 mg | DELAYED_RELEASE_CAPSULE | Freq: Every morning | ORAL | 0 refills | Status: DC
Start: 2023-01-19 — End: 2023-04-15

## 2023-01-19 NOTE — Telephone Encounter (Signed)
Completed.

## 2023-01-19 NOTE — Progress Notes (Signed)
  Subjective:  Patient ID: [name] [name], female    DOB: [birth year],   MRN: [id number]  No chief complaint on file.   67 y.o. female presents for concern of thickened elongated and painful nails that are difficult to trim. Requesting to have them trimmed today. Relates burning and tingling in their feet. Patient is diabetic and last A1c was  Lab Results  Component Value Date   HGBA1C 6.1 01/06/2023   .   PCP:  Rema Fendt, NP    . Denies any other pedal complaints. Denies n/v/f/c.   Past Medical History:  Diagnosis Date   Arthritis    Breast cancer (HCC)    Cancer (HCC)    breast   Diabetes mellitus without complication (HCC)    History of MRI    Knee, Hip, and Back.   Hypertension    Personal history of chemotherapy    Personal history of radiation therapy     Objective:  Physical Exam: Vascular: DP/PT pulses 2/4 bilateral. CFT <3 seconds. Absent hair growth on digits. Edema noted to bilateral lower extremities. Xerosis noted bilaterally.  Skin. No lacerations or abrasions bilateral feet. Nails 1-5 bilateral  are thickened discolored and elongated with subungual debris.  Musculoskeletal: MMT 5/5 bilateral lower extremities in DF, PF, Inversion and Eversion. Deceased ROM in DF of ankle joint.  Neurological: Sensation intact to light touch. Protective sensation diminished bilateral.    Assessment:   1. Pain due to onychomycosis of toenails of both feet   2. Type 2 diabetes mellitus with diabetic polyneuropathy, without long-term current use of insulin (HCC)      Plan:  Patient was evaluated and treated and all questions answered. -Discussed and educated patient on diabetic foot care, especially with  regards to the vascular, neurological and musculoskeletal systems.  -Stressed the importance of good glycemic control and the detriment of not  controlling glucose levels in relation to the foot. -Discussed supportive shoes at all times and checking feet regularly.   -Mechanically debrided all nails 1-5 bilateral using sterile nail nipper and filed with dremel without incident  -Answered all patient questions -Patient to return  in 3 months for at risk foot care -Patient advised to call the office if any problems or questions arise in the meantime.   Louann Sjogren, DPM

## 2023-01-21 ENCOUNTER — Inpatient Hospital Stay: Payer: Medicaid Other | Attending: Hematology and Oncology | Admitting: Hematology and Oncology

## 2023-01-21 ENCOUNTER — Encounter: Payer: Self-pay | Admitting: Hematology and Oncology

## 2023-01-21 DIAGNOSIS — C50411 Malignant neoplasm of upper-outer quadrant of right female breast: Secondary | ICD-10-CM

## 2023-01-21 DIAGNOSIS — Z170 Estrogen receptor positive status [ER+]: Secondary | ICD-10-CM

## 2023-01-21 NOTE — Progress Notes (Signed)
[facility]  [phone] [phone]     ID: [name] [name] DOB: [birth year]  MR#: [id number]  YQI#:[id number]  Patient Care Team: Rema Fendt, NP as PCP - General (Nurse Practitioner) Pershing Proud, RN as Oncology Nurse Navigator Donnelly Angelica, RN as Oncology Nurse Navigator Harriette Bouillon, MD as Consulting Physician (General Surgery) Lonie Peak, MD as Attending Physician (Radiation Oncology) Rachel Moulds, MD as Medical Oncologist (Hematology and Oncology) Anselm Lis, RPH-CPP as Pharmacist (Hematology and Oncology) Rachel Moulds, MD  CHIEF COMPLAINT: Estrogen receptor positive breast cancer  CURRENT TREATMENT: Anastrozole   INTERVAL HISTORY:  [name] [name] returns today for follow up of her estrogen receptor positive breast cancer. She is on anastrozole alone for adjuvant anti estrogen therapy.  Since her last visit, she says she has completed all her dental work.  We however have not received any dental clearance.  We will try to contact his dental office.  She otherwise feels well.  She is interested in considering bisphosphonates, she prefers the once a month formulary.   COVID 19 VACCINATION STATUS: s/p  [employer]  X2, most recently June 2021, no booster as of 05/01/2020   HISTORY OF CURRENT ILLNESS: From the original intake note:  [name] [name] herself palpated a mass in the right axilla "a long time ago", more recently with complaints of associated pain. She brought it to medical attention and underwent bilateral diagnostic mammography with tomography and bilateral breast ultrasonography at [facility] on 01/31/2020 showing: breast density category B; three right breast masses-- 1.9 cm at 12 o'clock, 2.3 cm at 11 o'clock, 0.4 cm at 1 o'clock; two adjacent enlarged thickened lymph nodes measuring up to 2.1 cm; no evidence of left breast malignancy.  Accordingly on 02/03/2020 she proceeded to biopsy of the right breast area at 12  o'clock and right axilla. The pathology from this procedure ([id number]) showed: invasive ductal carcinoma, grade 3. Prognostic indicators significant for: estrogen receptor, 100% positive with strong staining intensity and progesterone receptor, 0% negative. Proliferation marker Ki67 at 60%. HER2 equivocal by immunohistochemistry (2+), but negative by fluorescent in situ hybridization with a signals ratio 0.81 and number per cell 2.7.  The biopsied lymph node was found to show metastatic carcinoma. The morphology was considered different from the biopsied mass, and a prognostic panel was performed. Estrogen receptor, 100% positive with strong staining intensity and progesterone receptor, 0% negative. Proliferation marker Ki67 at 70%. HER2 equivcoal by immunohistochemistry (2+), but negative) by fluorescent in situ hybridization with a signals ratio 1.16 and number per cell 3.9.  She underwent additional right breast biopsies on 02/06/2020. Pathology [id number]) from the mass at 11 o'clock showed: invasive ductal carcinoma, grade 2. Prognostic indicators significant for: estrogen receptor, 100% positive with strong staining intensity and progesterone receptor, 0% negative. Proliferation marker Ki67 at 15%. HER2 equivocal by immunohistochemistry (2+), but negative by fluorescent in situ hybridization with a signals ratio 1.29 and number per cell 2.7.  The biopsied mass at 1 o'clock showed only fibrocystic change.  The patient's subsequent history is as detailed below.   PAST MEDICAL HISTORY: Past Medical History:  Diagnosis Date   Arthritis    Breast cancer (HCC)    Cancer (HCC)    breast   Diabetes mellitus without complication (HCC)    History of MRI    Knee, Hip, and Back.   Hypertension    Personal history of chemotherapy    Personal history of radiation therapy     PAST SURGICAL  HISTORY: Past Surgical History:  Procedure Laterality Date   BREAST BIOPSY Right 02/03/2020   x2    BREAST BIOPSY Right 02/06/2020   x2   BREAST LUMPECTOMY Right 07/26/2020   BREAST LUMPECTOMY WITH RADIOACTIVE SEED AND SENTINEL LYMPH NODE BIOPSY Right 07/26/2020   Procedure: RIGHT BREAST LUMPECTOMY WITH RADIOACTIVE SEED X 2 AND SENTINEL LYMPH NODE BIOPSY;  Surgeon: Harriette Bouillon, MD;  Location: [facility];  Service: General;  Laterality: Right;   IR IMAGING GUIDED PORT INSERTION  02/23/2020   PORT-A-CATH REMOVAL N/A 07/26/2020   Procedure: REMOVAL PORT-A-CATH;  Surgeon: Harriette Bouillon, MD;  Location: [facility];  Service: General;  Laterality: N/A;   TOTAL KNEE ARTHROPLASTY Left 11/18/2021   Procedure: LEFT TOTAL KNEE ARTHROPLASTY;  Surgeon: [name], MD;  Location: [facility];  Service: Orthopedics;  Laterality: Left;    FAMILY HISTORY: Family History  Problem Relation Age of Onset   Asthma Mother    Arthritis Sister    Asthma Brother    Arthritis Brother   [name] died in his 66s from causes unknown to the patient.  [name] died from heart disease at age 32.  The patient had 4 brothers, 1 sister, with no history of cancer in the family to her knowledge   GYNECOLOGIC HISTORY:  No LMP recorded. Patient is postmenopausal. Menarche: 67 years old Age at first live birth: 67 years old GX P 1 LMP 58s Contraceptive HRT no  Hysterectomy? no BSO? no   SOCIAL HISTORY: (updated 02/2020)  [name] [name] is originally from [place].  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  She describes herself as single.  At home she lives with her daughter [name] who works for [employer], her daughters' husband [name] who is a Insurance underwriter, and their children who are 25 months old and 67 years old.  The patient is [place]    ADVANCED DIRECTIVES: Not in place.  The patient tells me she would intend to name her daughter [name] [name] as her healthcare power of attorney   HEALTH MAINTENANCE: Social History   Tobacco Use   Smoking  status: Never   Smokeless tobacco: Never  Vaping Use   Vaping status: Never Used  Substance Use Topics   Alcohol use: Yes    Alcohol/week: 1.0 standard drink of alcohol    Types: 1 Glasses of wine per week    Comment: occasionally   Drug use: Never     Colonoscopy: never done  PAP: 2017 (performed in [place])  Bone density:    Allergies  Allergen Reactions   Sulfa Antibiotics Itching    Current Outpatient Medications  Medication Sig Dispense Refill   amoxicillin (AMOXIL) 500 MG tablet Take 4 tablets by mouth 1 hour prior to dental procedure. (Patient not taking: Reported on 01/06/2023) 8 tablet 0   anastrozole (ARIMIDEX) 1 MG tablet Take 1 tablet (1 mg total) by mouth daily. 90 tablet 4   atorvastatin (LIPITOR) 40 MG tablet Take 1 tablet (40 mg total) by mouth daily. 90 tablet 1   calcium-vitamin D (OSCAL WITH D) 500-5 MG-MCG tablet Take 1 tablet by mouth daily. Will bring in dosing to next visit     carvedilol (COREG) 6.25 MG tablet Take 1 tablet (6.25 mg total) by mouth 2 (two) times daily with a meal. 180 tablet 1   diclofenac (VOLTAREN) 75 MG EC tablet Take 1 tablet (75 mg total) by mouth 2 (two) times  daily. 60 tablet 5   lansoprazole (PREVACID) 30 MG capsule Take 1 capsule (30 mg total) by mouth every morning. 90 capsule 0   Magnesium 100 MG CAPS Take 100 mg by mouth daily.     metFORMIN (GLUCOPHAGE) 1000 MG tablet Take 1 tablet (1,000 mg total) by mouth 2 (two) times daily with a meal. 180 tablet 0   Multiple Vitamins-Minerals (CENTRUM SILVER 50+WOMEN PO) Take 1 capsule by mouth daily.     traZODone (DESYREL) 50 MG tablet Take 1 tablet (50 mg total) by mouth at bedtime. 30 tablet 0   UNABLE TO FIND Med Name: Spry. Pt reports taking 1 scoopful in tea or water     valsartan (DIOVAN) 320 MG tablet Take 1 tablet (320 mg total) by mouth daily. 90 tablet 0   vitamin C (ASCORBIC ACID) 500 MG tablet Take 500 mg by mouth daily.     No current facility-administered  medications for this visit.    OBJECTIVE: African-American woman in no acute distress  There were no vitals filed for this visit.       There is no height or weight on file to calculate BMI.   Wt Readings from Last 3 Encounters:  01/06/23 206 lb 6.4 oz (93.6 kg)  10/16/22 201 lb 12.8 oz (91.5 kg)  07/25/22 203 lb 12.8 oz (92.4 kg)     ECOG FS:1 - Symptomatic but completely ambulatory  Physical examination not done, telephone visit LAB RESULTS:  CMP     Component Value Date/Time   NA 139 10/16/2022 1532   K 4.4 10/16/2022 1532   CL 106 10/16/2022 1532   CO2 29 10/16/2022 1532   GLUCOSE 133 (H) 10/16/2022 1532   BUN 12 10/16/2022 1532   CREATININE 0.79 10/16/2022 1532   CREATININE 0.75 07/21/2022 0820   CALCIUM 9.3 10/16/2022 1532   PROT 6.5 10/16/2022 1532   ALBUMIN 4.0 10/16/2022 1532   AST 25 10/16/2022 1532   ALT 22 10/16/2022 1532   ALKPHOS 92 10/16/2022 1532   BILITOT 0.3 10/16/2022 1532   GFRNONAA >60 10/16/2022 1532    No results found for: "TOTALPROTELP", "ALBUMINELP", "A1GS", "A2GS", "BETS", "BETA2SER", "GAMS", "MSPIKE", "SPEI"  Lab Results  Component Value Date   WBC 3.1 (L) 10/16/2022   NEUTROABS 2.0 10/16/2022   HGB 10.0 (L) 10/16/2022   HCT 30.9 (L) 10/16/2022   MCV 93.6 10/16/2022   PLT 192 10/16/2022    No results found for: "LABCA2"  No components found for: "NWGNFA213"  No results for input(s): "INR" in the last 168 hours.  No results found for: "LABCA2"  No results found for: "YQM578"  No results found for: "CAN125"  No results found for: "CAN153"  No results found for: "CA2729"  No components found for: "HGQUANT"  No results found for: "CEA1", "CEA" / No results found for: "CEA1", "CEA"   No results found for: "AFPTUMOR"  No results found for: "CHROMOGRNA"  No results found for: "KPAFRELGTCHN", "LAMBDASER", "KAPLAMBRATIO" (kappa/lambda light chains)  No results found for: "HGBA", "HGBA2QUANT", "HGBFQUANT",  "HGBSQUAN" (Hemoglobinopathy evaluation)   No results found for: "LDH"  No results found for: "IRON", "TIBC", "IRONPCTSAT" (Iron and TIBC)  No results found for: "FERRITIN"  Urinalysis    Component Value Date/Time   COLORURINE YELLOW 04/24/2022 0254   APPEARANCEUR CLEAR 04/24/2022 0254   LABSPEC 1.011 04/24/2022 0254   PHURINE 5.0 04/24/2022 0254   GLUCOSEU NEGATIVE 04/24/2022 0254   HGBUR NEGATIVE 04/24/2022 0254   BILIRUBINUR NEGATIVE 04/24/2022 0254  KETONESUR NEGATIVE 04/24/2022 0254   PROTEINUR NEGATIVE 04/24/2022 0254   NITRITE NEGATIVE 04/24/2022 0254   LEUKOCYTESUR NEGATIVE 04/24/2022 0254     STUDIES: No results found.   ELIGIBLE FOR AVAILABLE RESEARCH PROTOCOL: AET  ASSESSMENT: 67 y.o. [place] woman status post right breast upper outer quadrant (12:00) and axillary lymph node biopsy 02/03/2020, both positive for a clinical T1c N1, stage IIB invasive ductal carcinoma, grade 3, estrogen receptor positive, HER-2 and progesterone receptor negative, with an MIB-1 of 60%.  (a) biopsy of a second right upper outer quadrant T2 lesion (11 o'clock) 02/06/2020 also showed invasive ductal carcinoma, but grade 2, estrogen receptor positive, progesterone receptor and HER-2 negative, with an MIB-1 of 15%  (b) biopsy of a third right breast lesion (1:00) was benign   (1) neoadjuvant chemotherapy consisting of cyclophosphamide and docetaxel every 21 days x 4 starting 03/05/2020  (a) discontinued after 2 cycles because of peripheral neuropathy  (b) cyclophosphamide/doxorubicin started 04/24/2020, repeated every 21 days x4, completed 06/25/2020  (c) echo 04/17/2020 showed EF of 55-60%  (2) right lumpectomy 07/26/2020 found a residual pT1c pN1 invasive ductal carcinoma, grade 2, with negative margins  (a) repeat prognostic indicators show the tumor to be estrogen receptor positive, progesterone receptor and HER2 negative, with an MIB-1 of 2%.  (b) a total of 6 right axillary  lymph nodes removed, 2 positive  (3) adjuvant radiation completed 10/17/2020  (4) anastrozole started June 2022  (5) CT scan of the chest 04/23/2020 shows a left brachiocephalicl catheter associated clot  (a) rivaroxaban started 04/24/2020, discontinued 08/22/2020  (b) port removed 07/26/2020  (6) Started abemaciclib in March 2023, took it till Dec 2023, stopped it since then.  Last MRI breast Jan 2023 showed no evidence of breast malignancy. Post surgical collection right breast near right nipple which could have been causing the drainage.  Mammogram in Nov with stable lumpectomy changes, recommendation was to repeat mammogram in 1 year. She is here for follow-up abemaciclib and anastrozole however she hasn't been taking it for several months now, stopped in Dec 2023. Next mammogram due in Nov 2024.   Plan This is a follow-up telephone visit, she is doing very well on anastrozole.  She has now completed all her dental work and will be considering bisphosphonates once we receive the dental clearance. I have reviewed oral, IV forms of bisphosphonates and previously have discussed about side effects including hypocalcemia and osteonecrosis of the jaw.  She prefers once a month oral bisphosphonate at this time.  Once we receive dental clearance, we will try and prescribe Boniva and will continue to follow-up with her.  Total time: 10 min  I connected with  [name] [name] on 01/21/23 by a telephone application and verified that I am speaking with the correct person using two identifiers.   I discussed the limitations of evaluation and management by telemedicine. The patient expressed understanding and agreed to proceed.  *Total Encounter Time as defined by the Centers for Medicare and Medicaid Services includes, in addition to the face-to-face time of a patient visit (documented in the note above) non-face-to-face time: obtaining and reviewing outside history, ordering and reviewing medications,  tests or procedures, care coordination (communications with other health care professionals or caregivers) and documentation in the medical record.

## 2023-01-22 ENCOUNTER — Telehealth: Payer: Self-pay

## 2023-01-22 ENCOUNTER — Other Ambulatory Visit: Payer: Self-pay

## 2023-01-22 NOTE — Telephone Encounter (Signed)
Good afternoon [name] [name], can you give me a hand with this prio auth?  Diclofenac Sodium 75MG  Dr Tablets

## 2023-01-23 ENCOUNTER — Other Ambulatory Visit: Payer: Self-pay

## 2023-01-23 ENCOUNTER — Telehealth: Payer: Self-pay

## 2023-01-23 NOTE — Telephone Encounter (Signed)
Pharmacy Patient Advocate Encounter  Received notification from [facility] Medicaid that Prior Authorization for DICLOFENAC has been APPROVED from 01/23/2023 to 01/23/2024   PA #/Case ID/Reference #: [id number]

## 2023-01-23 NOTE — Telephone Encounter (Signed)
Pharmacy Patient Advocate Encounter   Received notification from Physician's Office that prior authorization for DICLOFENAC is required/requested.   Insurance verification completed.   The patient is insured through [facility] [place] .   Per test claim: PA required; PA submitted to [facility] via CoverMyMeds Key/confirmation #/EOC [id number] Status is pending

## 2023-02-10 ENCOUNTER — Encounter: Payer: Self-pay | Admitting: Orthopaedic Surgery

## 2023-02-10 NOTE — Telephone Encounter (Signed)
See message.  Thanks.

## 2023-02-17 ENCOUNTER — Ambulatory Visit: Payer: No Typology Code available for payment source

## 2023-02-17 ENCOUNTER — Encounter (HOSPITAL_COMMUNITY): Payer: Self-pay

## 2023-02-17 ENCOUNTER — Ambulatory Visit (INDEPENDENT_AMBULATORY_CARE_PROVIDER_SITE_OTHER): Payer: Medicaid Other

## 2023-02-17 ENCOUNTER — Ambulatory Visit (HOSPITAL_COMMUNITY)
Admission: RE | Admit: 2023-02-17 | Discharge: 2023-02-17 | Disposition: A | Discharge status: Home / Self Care | Payer: Medicaid Other | Source: Ambulatory Visit | Attending: Family Medicine | Admitting: Family Medicine

## 2023-02-17 ENCOUNTER — Encounter: Payer: Self-pay | Admitting: Family

## 2023-02-17 VITALS — BP 134/75 | HR 67 | Temp 98.8°F | Resp 18

## 2023-02-17 DIAGNOSIS — R053 Chronic cough: Secondary | ICD-10-CM | POA: Diagnosis not present

## 2023-02-17 DIAGNOSIS — M542 Cervicalgia: Secondary | ICD-10-CM

## 2023-02-17 DIAGNOSIS — M545 Low back pain, unspecified: Secondary | ICD-10-CM

## 2023-02-17 MED ORDER — BACLOFEN 5 MG PO TABS: 5.0000 mg | ORAL_TABLET | Freq: Two times a day (BID) | ORAL | 0 refills | Status: DC | PRN

## 2023-02-17 MED ORDER — LIDOCAINE 5 % EX PTCH: 1.0000 | MEDICATED_PATCH | CUTANEOUS | 0 refills | Status: DC

## 2023-02-17 NOTE — Discharge Instructions (Addendum)
Your chest x-ray is clear, and does not show any fluid or lumps or bumps.  Your heart is just a little bigger than usual.  Your neck and low back x-rays show a lot of degenerative changes.  No worrisome changes.  Take baclofen 5 mg--1 tablet every 12 hours as needed for muscle spasm or muscle pain.  This medication could cause drowsiness or dizziness  You can apply a lidocaine patch to the painful area once daily and remove it after 12 hours.  Please follow-up with your primary care about these issues

## 2023-02-17 NOTE — ED Triage Notes (Signed)
Pt c/o neck pain that now going to head and back for a couple months. Takes arthritis pain medication. Pt reports on her butt back in April.

## 2023-02-17 NOTE — Telephone Encounter (Signed)
Would you like patient to make appt

## 2023-02-17 NOTE — ED Provider Notes (Signed)
[facility]    CSN: [id number] Arrival date & time: 02/17/23  1754      History   Chief Complaint Chief Complaint  Patient presents with   Neck Pain   Back Pain    HPI Antonette Hendricks is a 67 y.o. female.    Neck Pain Back Pain Here for 2 months for neck and back pain.  Before that she would have cracking in her neck when turned her head, but any pain would go away quickly.  Now her pain is constant  Already takes diclofenac, sometimes helps, sometimes not.  She fell onto her tailbone in April, and has not been evaluated for that.   No fever. Has been lightheaded.  Is concerned that her breast cancer is coming back.    Past Medical History:  Diagnosis Date   Arthritis    Breast cancer (HCC)    Cancer (HCC)    breast   Diabetes mellitus without complication (HCC)    History of MRI    Knee, Hip, and Back.   Hypertension    Personal history of chemotherapy    Personal history of radiation therapy     Patient Active Problem List   Diagnosis Date Noted   Sepsis (HCC) 04/24/2022   Primary osteoarthritis of right knee 02/11/2022   Status post total left knee replacement 11/18/2021   Primary osteoarthritis of left knee 08/06/2021   Aortic atherosclerosis (HCC) 04/24/2020   Deep venous thrombosis of left upper extremity (HCC) 04/24/2020   Port-A-Cath in place 03/26/2020   Malignant neoplasm of upper-outer quadrant of right breast in female, estrogen receptor positive (HCC) 02/13/2020    Past Surgical History:  Procedure Laterality Date   BREAST BIOPSY Right 02/03/2020   x2   BREAST BIOPSY Right 02/06/2020   x2   BREAST LUMPECTOMY Right 07/26/2020   BREAST LUMPECTOMY WITH RADIOACTIVE SEED AND SENTINEL LYMPH NODE BIOPSY Right 07/26/2020   Procedure: RIGHT BREAST LUMPECTOMY WITH RADIOACTIVE SEED X 2 AND SENTINEL LYMPH NODE BIOPSY;  Surgeon: Harriette Bouillon, MD;  Location: [facility];  Service: General;  Laterality: Right;   IR IMAGING  GUIDED PORT INSERTION  02/23/2020   PORT-A-CATH REMOVAL N/A 07/26/2020   Procedure: REMOVAL PORT-A-CATH;  Surgeon: Harriette Bouillon, MD;  Location: [facility];  Service: General;  Laterality: N/A;   TOTAL KNEE ARTHROPLASTY Left 11/18/2021   Procedure: LEFT TOTAL KNEE ARTHROPLASTY;  Surgeon: [name], MD;  Location: [facility];  Service: Orthopedics;  Laterality: Left;    OB History   No obstetric history on file.      Home Medications    Prior to Admission medications   Medication Sig Start Date End Date Taking? Authorizing Provider  Baclofen 5 MG TABS Take 1 tablet (5 mg total) by mouth every 12 (twelve) hours as needed (muscle pain). 02/17/23  Yes Zenia Resides, MD  lidocaine (LIDODERM) 5 % Place 1 patch onto the skin daily. Remove & Discard patch within 12 hours or as directed by MD 02/17/23  Yes Tailynn Armetta, Janace Aris, MD  anastrozole (ARIMIDEX) 1 MG tablet Take 1 tablet (1 mg total) by mouth daily. 04/11/22   Rachel Moulds, MD  atorvastatin (LIPITOR) 40 MG tablet Take 1 tablet (40 mg total) by mouth daily. 10/21/22   Ngetich, Dinah C, NP  calcium-vitamin D (OSCAL WITH D) 500-5 MG-MCG tablet Take 1 tablet by mouth daily. Will bring in dosing to next visit    [provider]  carvedilol (COREG)  6.25 MG tablet Take 1 tablet (6.25 mg total) by mouth 2 (two) times daily with a meal. 11/03/22   Ngetich, Dinah C, NP  diclofenac (VOLTAREN) 75 MG EC tablet Take 1 tablet (75 mg total) by mouth 2 (two) times daily. 01/19/23   Rema Fendt, NP  lansoprazole (PREVACID) 30 MG capsule Take 1 capsule (30 mg total) by mouth every morning. 01/19/23   Rema Fendt, NP  Magnesium 100 MG CAPS Take 100 mg by mouth daily.    [provider]  metFORMIN (GLUCOPHAGE) 1000 MG tablet Take 1 tablet (1,000 mg total) by mouth 2 (two) times daily with a meal. 01/06/23 04/06/23  Rema Fendt, NP  Multiple Vitamins-Minerals (CENTRUM SILVER 50+WOMEN PO) Take 1 capsule by mouth daily.     [provider]  traZODone (DESYREL) 50 MG tablet Take 1 tablet (50 mg total) by mouth at bedtime. 01/06/23   Rema Fendt, NP  UNABLE TO FIND Med Name: [place]. Pt reports taking 1 scoopful in tea or water    [provider]  valsartan (DIOVAN) 320 MG tablet Take 1 tablet (320 mg total) by mouth daily. 01/06/23 04/06/23  Rema Fendt, NP  vitamin C (ASCORBIC ACID) 500 MG tablet Take 500 mg by mouth daily.    [provider]    Family History Family History  Problem Relation Age of Onset   Asthma Mother    Arthritis Sister    Asthma Brother    Arthritis Brother     Social History Social History   Tobacco Use   Smoking status: Never   Smokeless tobacco: Never  Vaping Use   Vaping status: Never Used  Substance Use Topics   Alcohol use: Yes    Alcohol/week: 1.0 standard drink of alcohol    Types: 1 Glasses of wine per week    Comment: occasionally   Drug use: Never     Allergies   Sulfa antibiotics   Review of Systems Review of Systems  Musculoskeletal:  Positive for back pain and neck pain.     Physical Exam Triage Vital Signs ED Triage Vitals  Encounter Vitals Group     BP 02/17/23 1810 134/75     Systolic BP Percentile --      Diastolic BP Percentile --      Pulse Rate 02/17/23 1810 67     Resp 02/17/23 1810 18     Temp 02/17/23 1810 98.8 F (37.1 C)     Temp Source 02/17/23 1810 Oral     SpO2 02/17/23 1810 96 %     Weight --      Height --      Head Circumference --      Peak Flow --      Pain Score 02/17/23 1809 6     Pain Loc --      Pain Education --      Exclude from Growth Chart --    No data found.  Updated Vital Signs BP 134/75 (BP Location: Left Arm)   Pulse 67   Temp 98.8 F (37.1 C) (Oral)   Resp 18   SpO2 96%   Visual Acuity Right Eye Distance:   Left Eye Distance:   Bilateral Distance:    Right Eye Near:   Left Eye Near:    Bilateral Near:     Physical Exam Vitals reviewed.   Constitutional:      General: She is not in acute distress.  Appearance: She is not toxic-appearing.  HENT:     Mouth/Throat:     Mouth: Mucous membranes are moist.  Eyes:     Extraocular Movements: Extraocular movements intact.     Pupils: Pupils are equal, round, and reactive to light.  Cardiovascular:     Rate and Rhythm: Normal rate and regular rhythm.     Heart sounds: No murmur heard. Pulmonary:     Effort: Pulmonary effort is normal.     Breath sounds: Normal breath sounds.  Musculoskeletal:     Cervical back: Neck supple.     Comments: There is tenderness of the neck.  No obvious deformity.  Lymphadenopathy:     Cervical: No cervical adenopathy.  Skin:    Coloration: Skin is not jaundiced or pale.  Neurological:     General: No focal deficit present.     Mental Status: She is alert.  Psychiatric:        Behavior: Behavior normal.      UC Treatments / Results  Labs (all labs ordered are listed, but only abnormal results are displayed) Labs Reviewed - No data to display  EKG   Radiology DG Chest 2 View  Result Date: 02/17/2023 CLINICAL DATA:  Chronic cough.  Neck and back pain. EXAM: CHEST - 2 VIEW COMPARISON:  06/12/2022 FINDINGS: Mild chronic cardiomegaly. Chronic aortic tortuosity. The lungs are clear except for a few small scars. No infiltrate, collapse or effusion. Surgical clips related to the right breast and axilla. IMPRESSION: No active disease. Mild chronic cardiomegaly and aortic tortuosity. Electronically Signed   By: Paulina Fusi M.D.   On: 02/17/2023 19:49   DG Lumbar Spine 2-3 Views  Result Date: 02/17/2023 CLINICAL DATA:  Back pain over the last 2 months EXAM: LUMBAR SPINE - 2-3 VIEW COMPARISON:  07/15/2021 FINDINGS: Scoliotic curvature convex to the right. Degenerative anterolisthesis at L3-4 of 2 mm. Degenerative anterolisthesis at L4-5 of 8 mm. Disc space narrowing throughout the lumbar region. Lower lumbar facet osteoarthritis. Findings  could certainly be painful. Central pelvic calcifications of unknown nature. Bladder stones not excluded. IMPRESSION: 1. Scoliotic curvature convex to the right. 2. Degenerative anterolisthesis at L3-4 and L4-5. Lumbar region degenerative disc disease and degenerative facet disease. Findings could certainly be painful. Electronically Signed   By: Paulina Fusi M.D.   On: 02/17/2023 19:48   DG Cervical Spine 2-3 Views  Result Date: 02/17/2023 CLINICAL DATA:  Neck pain over the last 2 months. EXAM: CERVICAL SPINE - 2-3 VIEW COMPARISON:  None Available. FINDINGS: Mild scoliotic curvature convex to the left. 2 mm retrolisthesis C3-4. Chronic advanced degenerative spondylosis C3-4 with disc space narrowing. Mild disc space narrowing at the C4-5 level. No visible facet arthropathy. IMPRESSION: Mild scoliotic curvature convex to the left. Chronic advanced degenerative spondylosis at C3-4 with 2 mm retrolisthesis. Mild disc space narrowing at C4-5. Electronically Signed   By: Paulina Fusi M.D.   On: 02/17/2023 19:47    Procedures Procedures (including critical care time)  Medications Ordered in UC Medications - No data to display  Initial Impression / Assessment and Plan / UC Course  I have reviewed the triage vital signs and the nursing notes.  Pertinent labs & imaging results that were available during my care of the patient were reviewed by me and considered in my medical decision making (see chart for details).        X-ray shows some degenerative changes in her C-spine and L-spine but some scoliosis also.  No serious  pathology noted.  Chest x-ray shows some mild cardiomegaly, but no fluid or infiltrate.  Also no mass.  I have sent a prescription for lidocaine patches.  I have asked her to follow-up with her primary care about the neck and back pain.  I have also sent a low-dose muscle relaxer, baclofen, to try to help her pain. Final Clinical Impressions(s) / UC Diagnoses   Final diagnoses:   Neck pain  Acute bilateral low back pain without sciatica  Chronic cough     Discharge Instructions      Your chest x-ray is clear, and does not show any fluid or lumps or bumps.  Your heart is just a little bigger than usual.  Your neck and low back x-rays show a lot of degenerative changes.  No worrisome changes.  Take baclofen 5 mg--1 tablet every 12 hours as needed for muscle spasm or muscle pain.  This medication could cause drowsiness or dizziness  You can apply a lidocaine patch to the painful area once daily and remove it after 12 hours.  Please follow-up with your primary care about these issues      ED Prescriptions     Medication Sig Dispense Auth. Provider   Baclofen 5 MG TABS Take 1 tablet (5 mg total) by mouth every 12 (twelve) hours as needed (muscle pain). 20 tablet Khalila Buechner, Janace Aris, MD   lidocaine (LIDODERM) 5 % Place 1 patch onto the skin daily. Remove & Discard patch within 12 hours or as directed by MD 30 patch Yolinda Duerr, Janace Aris, MD      PDMP not reviewed this encounter.   Zenia Resides, MD 02/17/23 2003

## 2023-02-18 NOTE — Telephone Encounter (Signed)
Schedule appointment?

## 2023-02-18 NOTE — Telephone Encounter (Signed)
Please schedule appointment for further xray

## 2023-02-25 NOTE — Telephone Encounter (Signed)
Patient daughter stated she would call if patient needs follow up appointment patient is doing okay had xray done at urgent care

## 2023-03-05 ENCOUNTER — Other Ambulatory Visit: Payer: Self-pay

## 2023-03-06 ENCOUNTER — Encounter: Payer: Self-pay | Admitting: Cardiology

## 2023-03-06 ENCOUNTER — Ambulatory Visit: Payer: Medicaid Other | Attending: Cardiology | Admitting: Cardiology

## 2023-03-06 VITALS — BP 137/83 | HR 56 | Ht 69.0 in | Wt 223.0 lb

## 2023-03-06 DIAGNOSIS — I951 Orthostatic hypotension: Secondary | ICD-10-CM | POA: Diagnosis not present

## 2023-03-06 DIAGNOSIS — I10 Essential (primary) hypertension: Secondary | ICD-10-CM

## 2023-03-06 DIAGNOSIS — E1169 Type 2 diabetes mellitus with other specified complication: Secondary | ICD-10-CM | POA: Diagnosis not present

## 2023-03-06 DIAGNOSIS — R011 Cardiac murmur, unspecified: Secondary | ICD-10-CM | POA: Diagnosis not present

## 2023-03-06 DIAGNOSIS — E785 Hyperlipidemia, unspecified: Secondary | ICD-10-CM

## 2023-03-06 MED ORDER — VALSARTAN 40 MG PO TABS: 40.0000 mg | ORAL_TABLET | Freq: Every day | ORAL | 3 refills | Status: DC

## 2023-03-06 NOTE — Progress Notes (Unsigned)
Cardiology Office Note:  .   Date:  03/09/2023  ID:  [name] [name], DOB Jun 04, 1955, MRN [id number] PCP: [name] Fendt, NP  [facility] Providers Cardiologist:  [name] Lemma, MD     Chief Complaint  Patient presents with   New Patient (Initial Visit)    Hypertension as well as orthostatic hypotension    Patient Profile: .     [name] [name] is a 67 y.o. female (native of [place]), non-smoker with a PMH notable for essential hypertension, DM-2, HLD, prior DVT, H/O R Br CA who presents here for Evaluation of Orthostatic Hypotension at the request of [name] Fendt, NP.   [name] [name] was seen by Dr. Ricky [name] on 01/06/2023 to establish primary care.  Were noted to be on both carvedilol and valsartan at pretty high doses.  Discussed low-sodium diet.  However was also noted to have orthostatic hypotension.  Referred to Cardiology for hypertension, hyperlipidemia.  Subjective  Discussed the use of AI scribe software for clinical note transcription with the patient, who gave verbal consent to proceed.  History of Present Illness   The patient, with a history of hypertension and diabetes, presented with complaints of dizziness, particularly in the mornings. The dizziness was associated with a drop in blood pressure, which was observed to decrease from 137/83 to 114/74 upon standing. She has noted dizziness, but no syncope or near syncope.  No TIA or amaurosis fugax/CVA symptoms.  She also denies HA or blurred vision from elevated BP - just maybe a bit more SOB than usual.   The patient also reported occasional episodes of heart palpitations, particularly when lying down. These episodes were brief, lasting only a few seconds, and did not cause any associated dizziness. The patient denied any chest pain or pressure but did report occasional shortness of breath, particularly when walking uphill.  The patient also reported some difficulty sleeping and neck pain but denied any episodes of  orthopnea or PND. The patient also reported occasional leg swelling, particularly after prolonged sitting or standing, which improved with leg elevation.   The patient's diabetes and cholesterol were reported to be well-controlled, with a recent HbA1c of 6.1 and favorable lipid profile. The patient also reported numbness in the legs, likely related to diabetic neuropathy.  The patient's symptoms have been ongoing for approximately one to two months.      ROS:  Review of Systems - Negative except symptoms noted in HPI    Objective   Current Meds  Medication Sig   anastrozole (ARIMIDEX) 1 MG tablet Take 1 tablet (1 mg total) by mouth daily.   atorvastatin (LIPITOR) 40 MG tablet Take 1 tablet (40 mg total) by mouth daily.   calcium-vitamin D 500-5 MG-MCG tablet Take 1 tablet by mouth daily. Will bring in dosing to next visit   carvedilol (COREG) 6.25 MG tablet Take 1 tablet (6.25 mg total) by mouth 2 (two) times daily with a meal. => She is currently only taking it once a day   diclofenac (VOLTAREN) 75 MG EC tablet Take 1 tablet (75 mg total) by mouth 2 (two) times daily.   lansoprazole (PREVACID) 30 MG capsule Take 1 capsule (30 mg total) by mouth every morning.   Magnesium 100 MG CAPS Take 100 mg by mouth daily.   metFORMIN (GLUCOPHAGE) 1000 MG tablet Take 1 tablet (1,000 mg total) by mouth 2 (two) times daily with a meal.   Multiple Vitamins-Minerals (CENTRUM SILVER 50+WOMEN PO) Take 1 capsule by mouth daily.  UNABLE TO FIND Med Name: [place]. Pt reports taking 1 scoopful in tea or water   valsartan (DIOVAN) 320 MG tablet Take 1 tablet  by mouth daily. Stop by PCP   vitamin C (ASCORBIC ACID) 500 MG tablet Take 500 mg by mouth daily.   Studies Reviewed: Marland Kitchen   EKG Interpretation Date/Time:  Friday March 06 2023 16:41:10 EDT Ventricular Rate:  56 PR Interval:  154 QRS Duration:  122 QT Interval:  412 QTC Calculation: 397 R Axis:   -42  Text Interpretation: Sinus bradycardia  Left axis deviation Left ventricular hypertrophy with QRS widening ( R in aVL , Cornell product , Romhilt-Estes ) with repolarization abnormality Nonspecific T wave abnormality When compared with ECG of 24-Apr-2022 16:40, PREVIOUS ECG IS PRESENT Confirmed by [name] [name] ([id number]) on 03/09/2023 5:00:07 PM   ECHO 04/17/2020 (during breast cancer treatment): Normal LV size and function.  EF 55 to 60%.    No RWMA.  Mild concentric LVH. GR 1 DD.  Normal aortic and mitral valves.  Borderline ascending aortic dilation of 40 mm.  Normal RVP and RAP. CT chest with contrast 04/26/2022: Mild aortic and coronary calcifications.  Normal heart with no pericardial effusion.  Groundglass opacities associated small scattered patchy consolidations in the inferior RLL.  Also LLL.  Subpleural reticulation and anterolateral aspect of the RUL.  Subsegmental atelectasis of the lingula.  Age-indeterminate superior endplate compression of T4 vertebral artery   LABS(10/2022): Total cholesterol: 136; HDL: 67; LDL: 55; Triglycerides: 63; HbA1c: 6.1, Hemoglobin (Hb): 10, Creatinine (Cr): 0.79, Potassium (K): 4.4  Risk Assessment/Calculations:          Physical Exam:   VS:  BP 137/83   Pulse (!) 56   Ht 5\' 9"  (1.753 m)   Wt 223 lb (101.2 kg)   SpO2 99%   BMI 32.93 kg/m    Orthostatic VS for the past 24 hrs (Last 3 readings):  BP- Lying Pulse- Lying BP- Sitting Pulse- Sitting BP- Standing at 0 minutes Pulse- Standing at 0 minutes BP- Standing at 3 minutes Pulse- Standing at 3 minutes  03/06/23 1647 137/83 61 122/76 64 121/78 68 114/74 72    Wt Readings from Last 3 Encounters:  03/06/23 223 lb (101.2 kg)  01/06/23 206 lb 6.4 oz (93.6 kg)  10/16/22 201 lb 12.8 oz (91.5 kg)    GEN: Well nourished, well developed in no acute distress; mildly obese.  Well-groomed NECK: No JVD; No carotid bruits CARDIAC: RRR, (distant heart sounds) - normal S1, S2; soft 1/6 SEM at RUSB; no additional murmurs, cannot exclude S4 gallop; no  rubs; palpable pedal pulses. RESPIRATORY:  Clear to auscultation without rales, wheezing or rhonchi ; nonlabored, good air movement. ABDOMEN: Soft, non-tender, non-distended EXTREMITIES:  No edema; No deformity     ASSESSMENT AND PLAN: .    Problem List Items Addressed This Visit       Cardiology Problems   Hyperlipidemia associated with type 2 diabetes mellitus (HCC) (Chronic)    Hyperlipidemia Well-controlled with total cholesterol of 136, HDL 67, LDL 55, and triglycerides 63. -Continue current management => atorvastatin 40 mg daily  Diabetes Well-controlled with an A1c of 6.1. -Continue current management => metformin 1000 mg twice daily      Relevant Medications   valsartan (DIOVAN) 40 MG tablet   Orthostatic hypotension - Primary (Chronic)    Morning dizziness and lightheadedness with a significant drop in blood pressure upon standing. Likely secondary to autonomic dysfunction from diabetes. Carvedilol may be  exacerbating symptoms by preventing an appropriate heart rate response. -Wean off Carvedilol over the next two weeks:half tablet twice daily for one week, then half tablet once daily in the morning for one week. -Start Valsartan 40mg :half tablet daily for one week (while still on evening Carvedilol), then full tablet daily at lunchtime. -Encouraged increased fluid intake to 3-4 20oz bottles daily. -Monitor blood pressure at home, report if consistently above 160.      Relevant Medications   valsartan (DIOVAN) 40 MG tablet   Other Relevant Orders   EKG 12-Lead (Completed)   ECHOCARDIOGRAM COMPLETE   Primary hypertension (Chronic)    Blood pressure still borderline elevated supine today, but she did have significant orthostasis with BP drop and no heart rate increase.  As such, would like to reduce her beta-blocker dose and lieu of restarting lower dose of ARB.  Will start with low-dose valsartan since she has been able to tolerate this in the past.  With history of  LVH-likely hypertensive heart disease, will reassess with 2D Echo  Wean off carvedilol-which she is taking 6.25 mg once daily and initiate losartan 40 mg daily.  (See instructions)      Relevant Medications   valsartan (DIOVAN) 40 MG tablet   Other Relevant Orders   EKG 12-Lead (Completed)   ECHOCARDIOGRAM COMPLETE     Other   Heart murmur    Soft murmur noted on examination, possibly a flow murmur due to a thick and stiff heart. -Order echocardiogram to further evaluate.       Patient Instructions: Changed taking carvedilol to 3.125 mg  ( take 1/2 tablet of 6.25 mg)  twice a day for 7 days then  Decrease taking ( once a day) carvedilol 3.125 mg in th evening for 7 days  Start taking Valsartan 20 mg ( 1/2 tablet of 40 mg) in the morning along with taking carvedilol 3.125 mg  for 7 days then stop taking carvedilol all together. Start taking  the increase valsartan 40 mg at lunch time once carvedilol is discontinued    Follow-up: Return in about 3 months (around 06/06/2023).   -Follow-up appointment in January 2025 to review results and reassess blood pressure management.   Total time spent: 29 min spent with patient + 13 min spent charting = 42 min    Signed, Marykay Lex, MD, MS [name] [name], M.D., M.S. Interventional Cardiologist  [facility]  Pager # [phone] Phone # [phone] [address] [place], [place] [address]

## 2023-03-06 NOTE — Patient Instructions (Addendum)
Medication Instructions:    Carvedilol 3.125 mg  ( take 1/2 tablet of 6.25 mg)  twice a day for 7 days then  Decrease taking ( once a day) Carvedilol 3.125 mg in th evening for 7 days  Start taking Valsartan 20 mg ( 1/2 tablet of 40 mg) in the morning along with taking carvedilol 3.125 mg  for 7 days then stop taking Carvedilol all together. Start taking  the increase valsartan 40 mg at lunch time  *If you need a refill on your cardiac medications before your next appointment, please call your pharmacy*   Lab Work: Not needed    Testing/Procedures:  Will be schedule at [facility] [address] Your physician has requested that you have an echocardiogram. Echocardiography is a painless test that uses sound waves to create images of your heart. It provides your doctor with information about the size and shape of your heart and how well your heart's chambers and valves are working. This procedure takes approximately one hour. There are no restrictions for this procedure. Please do NOT wear cologne, perfume, aftershave, or lotions (deodorant is allowed). Please arrive 15 minutes prior to your appointment time.   Follow-Up: At [facility], you and your health needs are our priority.  As part of our continuing mission to provide you with exceptional heart care, we have created designated Provider Care Teams.  These Care Teams include your primary Cardiologist (physician) and Advanced Practice Providers (APPs -  Physician Assistants and Nurse Practitioners) who all work together to provide you with the care you need, when you need it.     Your next appointment:   3 month(s)  The format for your next appointment:   In Person  Provider:   Bryan Lemma, MD    Other Instructions    Monitor blood pressure    Try taking blood pressure at least 3 times a week  if  blood pressure becomes consistently above 160's   Contact the office

## 2023-03-09 ENCOUNTER — Encounter: Payer: Self-pay | Admitting: Cardiology

## 2023-03-09 DIAGNOSIS — R011 Cardiac murmur, unspecified: Secondary | ICD-10-CM | POA: Insufficient documentation

## 2023-03-09 DIAGNOSIS — E1169 Type 2 diabetes mellitus with other specified complication: Secondary | ICD-10-CM | POA: Insufficient documentation

## 2023-03-09 DIAGNOSIS — E785 Hyperlipidemia, unspecified: Secondary | ICD-10-CM | POA: Insufficient documentation

## 2023-03-09 DIAGNOSIS — I10 Essential (primary) hypertension: Secondary | ICD-10-CM | POA: Insufficient documentation

## 2023-03-09 NOTE — Assessment & Plan Note (Signed)
Hyperlipidemia Well-controlled with total cholesterol of 136, HDL 67, LDL 55, and triglycerides 63. -Continue current management => atorvastatin 40 mg daily  Diabetes Well-controlled with an A1c of 6.1. -Continue current management => metformin 1000 mg twice daily

## 2023-03-09 NOTE — Assessment & Plan Note (Signed)
Soft murmur noted on examination, possibly a flow murmur due to a thick and stiff heart. -Order echocardiogram to further evaluate.

## 2023-03-09 NOTE — Assessment & Plan Note (Signed)
Morning dizziness and lightheadedness with a significant drop in blood pressure upon standing. Likely secondary to autonomic dysfunction from diabetes. Carvedilol may be exacerbating symptoms by preventing an appropriate heart rate response. -Wean off Carvedilol over the next two weeks:half tablet twice daily for one week, then half tablet once daily in the morning for one week. -Start Valsartan 40mg :half tablet daily for one week (while still on evening Carvedilol), then full tablet daily at lunchtime. -Encouraged increased fluid intake to 3-4 20oz bottles daily. -Monitor blood pressure at home, report if consistently above 160.

## 2023-03-09 NOTE — Assessment & Plan Note (Addendum)
Blood pressure still borderline elevated supine today, but she did have significant orthostasis with BP drop and no heart rate increase.  As such, would like to reduce her beta-blocker dose and lieu of restarting lower dose of ARB.  Will start with low-dose valsartan since she has been able to tolerate this in the past.  With history of LVH-likely hypertensive heart disease, will reassess with 2D Echo  Wean off carvedilol-which she is taking 6.25 mg once daily and initiate losartan 40 mg daily.  (See instructions)

## 2023-03-18 ENCOUNTER — Telehealth: Payer: Self-pay | Admitting: Hematology and Oncology

## 2023-03-18 NOTE — Telephone Encounter (Signed)
Patient's daughter is aware of rescheduled appointment times/dates

## 2023-03-30 ENCOUNTER — Other Ambulatory Visit (HOSPITAL_COMMUNITY): Payer: No Typology Code available for payment source

## 2023-04-01 NOTE — Telephone Encounter (Signed)
Telephone call  

## 2023-04-02 NOTE — Pre-Procedure Instructions (Signed)
Surgical Instructions   Your procedure is scheduled on April 13, 2023. Report to [facility] Main Entrance "A" at 7:15 A.M., then check in with the Admitting office. Any questions or running late day of surgery: call [phone]  Questions prior to your surgery date: call [phone], Monday-Friday, 8am-4pm. If you experience any cold or flu symptoms such as cough, fever, chills, shortness of breath, etc. between now and your scheduled surgery, please notify us at the above number.     Remember:  Do not eat after midnight the night before your surgery  You may drink clear liquids until 6:45 AM the morning of your surgery.   Clear liquids allowed are: Water, Non-Citrus Juices (without pulp), Carbonated Beverages, Clear Tea (no milk, honey, etc.), Black Coffee Only (NO MILK, CREAM OR POWDERED CREAMER of any kind), and Gatorade.  Patient Instructions  The night before surgery:  No food after midnight. ONLY clear liquids after midnight  The day of surgery (if you have diabetes): Drink ONE (1) 12 oz G2 given to you in your pre admission testing appointment by 6:45 AM the morning of surgery. Drink in one sitting. Do not sip.  This drink was given to you during your hospital  pre-op appointment visit.  Nothing else to drink after completing the  12 oz bottle of G2.         If you have questions, please contact your surgeon's office.     Take these medicines the morning of surgery with A SIP OF WATER: anastrozole (ARIMIDEX)  atorvastatin (LIPITOR)  lansoprazole (PREVACID)    Follow your surgeon's instructions on when to stop Asprin.  If no instructions were given by your surgeon then you will need to call the office to get those instructions.     One week prior to surgery, STOP taking any Aleve, Naproxen, Ibuprofen, Motrin, Advil, Goody's, BC's, all herbal medications, fish oil, and non-prescription vitamins. This includes your medication:diclofenac (VOLTAREN) tablet   WHAT DO  I DO ABOUT MY DIABETES MEDICATION?   Do not take metFORMIN (GLUCOPHAGE) the morning of surgery.   HOW TO MANAGE YOUR DIABETES BEFORE AND AFTER SURGERY  Why is it important to control my blood sugar before and after surgery? Improving blood sugar levels before and after surgery helps healing and can limit problems. A way of improving blood sugar control is eating a healthy diet by:  Eating less sugar and carbohydrates  Increasing activity/exercise  Talking with your doctor about reaching your blood sugar goals High blood sugars (greater than 180 mg/dL) can raise your risk of infections and slow your recovery, so you will need to focus on controlling your diabetes during the weeks before surgery. Make sure that the doctor who takes care of your diabetes knows about your planned surgery including the date and location.  How do I manage my blood sugar before surgery? Check your blood sugar at least 4 times a day, starting 2 days before surgery, to make sure that the level is not too high or low.  Check your blood sugar the morning of your surgery when you wake up and every 2 hours until you get to the Short Stay unit.  If your blood sugar is less than 70 mg/dL, you will need to treat for low blood sugar: Do not take insulin. Treat a low blood sugar (less than 70 mg/dL) with  cup of clear juice (cranberry or apple), 4 glucose tablets, OR glucose gel. Recheck blood sugar in 15 minutes after treatment (to  make sure it is greater than 70 mg/dL). If your blood sugar is not greater than 70 mg/dL on recheck, call [phone] for further instructions. Report your blood sugar to the short stay nurse when you get to Short Stay.  If you are admitted to the hospital after surgery: Your blood sugar will be checked by the staff and you will probably be given insulin after surgery (instead of oral diabetes medicines) to make sure you have good blood sugar levels. The goal for blood sugar control after  surgery is 80-180 mg/dL.                      Do NOT Smoke (Tobacco/Vaping) for 24 hours prior to your procedure.  If you use a CPAP at night, you may bring your mask/headgear for your overnight stay.   You will be asked to remove any contacts, glasses, piercing's, hearing aid's, dentures/partials prior to surgery. Please bring cases for these items if needed.    Patients discharged the day of surgery will not be allowed to drive home, and someone needs to stay with them for 24 hours.  SURGICAL WAITING ROOM VISITATION Patients may have no more than 2 support people in the waiting area - these visitors may rotate.   Pre-op nurse will coordinate an appropriate time for 1 ADULT support person, who may not rotate, to accompany patient in pre-op.  Children under the age of 28 must have an adult with them who is not the patient and must remain in the main waiting area with an adult.  If the patient needs to stay at the hospital during part of their recovery, the visitor guidelines for inpatient rooms apply.  Please refer to the [facility] website for the visitor guidelines for any additional information.   If you received a COVID test during your pre-op visit  it is requested that you wear a mask when out in public, stay away from anyone that may not be feeling well and notify your surgeon if you develop symptoms. If you have been in contact with anyone that has tested positive in the last 10 days please notify you surgeon.      Pre-operative 5 CHG Bathing Instructions   You can play a key role in reducing the risk of infection after surgery. Your skin needs to be as free of germs as possible. You can reduce the number of germs on your skin by washing with CHG (chlorhexidine gluconate) soap before surgery. CHG is an antiseptic soap that kills germs and continues to kill germs even after washing.   DO NOT use if you have an allergy to chlorhexidine/CHG or antibacterial soaps. If your skin  becomes reddened or irritated, stop using the CHG and notify one of our RNs at [phone].   Please shower with the CHG soap starting 4 days before surgery using the following schedule:     Please keep in mind the following:  DO NOT shave, including legs and underarms, starting the day of your first shower.   You may shave your face at any point before/day of surgery.  Place clean sheets on your bed the day you start using CHG soap. Use a clean washcloth (not used since being washed) for each shower. DO NOT sleep with pets once you start using the CHG.   CHG Shower Instructions:  Wash your face and private area with normal soap. If you choose to wash your hair, wash first with your normal shampoo.  After you use shampoo/soap, rinse your hair and body thoroughly to remove shampoo/soap residue.  Turn the water OFF and apply about 3 tablespoons (45 ml) of CHG soap to a CLEAN washcloth.  Apply CHG soap ONLY FROM YOUR NECK DOWN TO YOUR TOES (washing for 3-5 minutes)  DO NOT use CHG soap on face, private areas, open wounds, or sores.  Pay special attention to the area where your surgery is being performed.  If you are having back surgery, having someone wash your back for you may be helpful. Wait 2 minutes after CHG soap is applied, then you may rinse off the CHG soap.  Pat dry with a clean towel  Put on clean clothes/pajamas   If you choose to wear lotion, please use ONLY the CHG-compatible lotions on the back of this paper.   Additional instructions for the day of surgery: DO NOT APPLY any lotions, deodorants, cologne, or perfumes.   Do not bring valuables to the hospital. [facility] is not responsible for any belongings/valuables. Do not wear nail polish, gel polish, artificial nails, or any other type of covering on natural nails (fingers and toes) Do not wear jewelry or makeup Put on clean/comfortable clothes.  Please brush your teeth.  Ask your nurse before applying any  prescription medications to the skin.     CHG Compatible Lotions   Aveeno Moisturizing lotion  Cetaphil Moisturizing Cream  Cetaphil Moisturizing Lotion  Clairol Herbal Essence Moisturizing Lotion, Dry Skin  Clairol Herbal Essence Moisturizing Lotion, Extra Dry Skin  Clairol Herbal Essence Moisturizing Lotion, Normal Skin  Curel Age Defying Therapeutic Moisturizing Lotion with Alpha Hydroxy  Curel Extreme Care Body Lotion  Curel Soothing Hands Moisturizing Hand Lotion  Curel Therapeutic Moisturizing Cream, Fragrance-Free  Curel Therapeutic Moisturizing Lotion, Fragrance-Free  Curel Therapeutic Moisturizing Lotion, Original Formula  Eucerin Daily Replenishing Lotion  Eucerin Dry Skin Therapy Plus Alpha Hydroxy Crme  Eucerin Dry Skin Therapy Plus Alpha Hydroxy Lotion  Eucerin Original Crme  Eucerin Original Lotion  Eucerin Plus Crme Eucerin Plus Lotion  Eucerin TriLipid Replenishing Lotion  Keri Anti-Bacterial Hand Lotion  Keri Deep Conditioning Original Lotion Dry Skin Formula Softly Scented  Keri Deep Conditioning Original Lotion, Fragrance Free Sensitive Skin Formula  Keri Lotion Fast Absorbing Fragrance Free Sensitive Skin Formula  Keri Lotion Fast Absorbing Softly Scented Dry Skin Formula  Keri Original Lotion  Keri Skin Renewal Lotion Keri Silky Smooth Lotion  Keri Silky Smooth Sensitive Skin Lotion  Nivea Body Creamy Conditioning Oil  Nivea Body Extra Enriched Lotion  Nivea Body Original Lotion  Nivea Body Sheer Moisturizing Lotion Nivea Crme  Nivea Skin Firming Lotion  NutraDerm 30 Skin Lotion  NutraDerm Skin Lotion  NutraDerm Therapeutic Skin Cream  NutraDerm Therapeutic Skin Lotion  ProShield Protective Hand Cream  Provon moisturizing lotion  Please read over the following fact sheets that you were given.

## 2023-04-03 ENCOUNTER — Other Ambulatory Visit: Payer: Self-pay

## 2023-04-03 ENCOUNTER — Encounter (HOSPITAL_COMMUNITY)
Admission: RE | Admit: 2023-04-03 | Discharge: 2023-04-03 | Disposition: A | Discharge status: Home / Self Care | Payer: Medicaid Other | Source: Ambulatory Visit | Attending: Orthopaedic Surgery | Admitting: Orthopaedic Surgery

## 2023-04-03 ENCOUNTER — Encounter (HOSPITAL_COMMUNITY): Payer: Self-pay

## 2023-04-03 VITALS — BP 147/76 | HR 73 | Temp 98.6°F | Resp 17 | Ht 69.0 in | Wt 225.8 lb

## 2023-04-03 DIAGNOSIS — E119 Type 2 diabetes mellitus without complications: Secondary | ICD-10-CM | POA: Diagnosis not present

## 2023-04-03 DIAGNOSIS — Z01812 Encounter for preprocedural laboratory examination: Secondary | ICD-10-CM | POA: Diagnosis present

## 2023-04-03 DIAGNOSIS — M1711 Unilateral primary osteoarthritis, right knee: Secondary | ICD-10-CM | POA: Diagnosis not present

## 2023-04-03 DIAGNOSIS — Z01818 Encounter for other preprocedural examination: Secondary | ICD-10-CM

## 2023-04-03 HISTORY — DX: Gastro-esophageal reflux disease without esophagitis: K21.9

## 2023-04-03 HISTORY — DX: Headache, unspecified: R51.9

## 2023-04-03 LAB — SURGICAL PCR SCREEN
MRSA, PCR: NEGATIVE
Staphylococcus aureus: POSITIVE — AB

## 2023-04-03 LAB — CBC
HCT: 31.4 % — ABNORMAL LOW (ref 36.0–46.0)
Hemoglobin: 9.8 g/dL — ABNORMAL LOW (ref 12.0–15.0)
MCH: 26.6 pg (ref 26.0–34.0)
MCHC: 31.2 g/dL (ref 30.0–36.0)
MCV: 85.1 fL (ref 80.0–100.0)
Platelets: 266 10*3/uL (ref 150–400)
RBC: 3.69 MIL/uL — ABNORMAL LOW (ref 3.87–5.11)
RDW: 16.4 % — ABNORMAL HIGH (ref 11.5–15.5)
WBC: 4.5 10*3/uL (ref 4.0–10.5)
nRBC: 0 % (ref 0.0–0.2)

## 2023-04-03 LAB — BASIC METABOLIC PANEL
Anion gap: 5 (ref 5–15)
BUN: 10 mg/dL (ref 8–23)
CO2: 28 mmol/L (ref 22–32)
Calcium: 8.8 mg/dL — ABNORMAL LOW (ref 8.9–10.3)
Chloride: 107 mmol/L (ref 98–111)
Creatinine, Ser: 0.7 mg/dL (ref 0.44–1.00)
GFR, Estimated: >60 mL/min (ref >60)
Glucose, Bld: 109 mg/dL — ABNORMAL HIGH (ref 70–99)
Potassium: 4 mmol/L (ref 3.5–5.1)
Sodium: 140 mmol/L (ref 135–145)

## 2023-04-03 LAB — GLUCOSE, CAPILLARY: Glucose-Capillary: 98 mg/dL (ref 70–99)

## 2023-04-03 LAB — HEMOGLOBIN A1C
Hgb A1c MFr Bld: 6.2 % — ABNORMAL HIGH (ref 4.8–5.6)
Mean Plasma Glucose: 131.24 mg/dL

## 2023-04-03 NOTE — Progress Notes (Signed)
PCP - Ricky Stabs, NP Cardiologist - Dr. Bryan Lemma  PPM/ICD - denies   Chest x-ray - 02/17/23 EKG - 03/06/23 Stress Test - denies ECHO - 04/17/20 Cardiac Cath - denies  Sleep Study - denies   Fasting Blood Sugar - 100-120 Checks Blood Sugar once a day  Last dose of GLP1 agonist-  n/a   Blood Thinner Instructions: n/a Aspirin Instructions: f/u with surgeon  ERAS Protcol - yes PRE-SURGERY G2- given at PAT  COVID TEST- n/a   Anesthesia review: yes, pt recently established with Dr. Herbie Baltimore  Patient denies shortness of breath, fever, cough and chest pain at PAT appointment   All instructions explained to the patient, with a verbal understanding of the material. Patient agrees to go over the instructions while at home for a better understanding.  The opportunity to ask questions was provided.

## 2023-04-06 ENCOUNTER — Ambulatory Visit (HOSPITAL_COMMUNITY): Payer: Medicaid Other | Attending: Cardiology

## 2023-04-06 ENCOUNTER — Ambulatory Visit: Admission: RE | Admit: 2023-04-06 | Payer: No Typology Code available for payment source | Source: Ambulatory Visit

## 2023-04-06 DIAGNOSIS — I10 Essential (primary) hypertension: Secondary | ICD-10-CM | POA: Insufficient documentation

## 2023-04-06 DIAGNOSIS — I951 Orthostatic hypotension: Secondary | ICD-10-CM | POA: Diagnosis present

## 2023-04-06 LAB — ECHOCARDIOGRAM COMPLETE
Calc EF: 56.5 %
S' Lateral: 2.69 cm
Single Plane A2C EF: 56 %
Single Plane A4C EF: 55.7 %

## 2023-04-06 NOTE — Progress Notes (Signed)
Anesthesia Chart Review:  Case: [id number] Date/Time: 04/13/23 0929   Procedure: RIGHT TOTAL KNEE ARTHROPLASTY (Right: Knee)   Anesthesia type: Spinal   Pre-op diagnosis: right  knee osteoarthritis   [facility]: [facility] OR ROOM 06 / [facility] OR   Surgeons: [name], MD       DISCUSSION: Patient is a 67 year old female scheduled for the above procedure.  History includes never smoker, HTN, DM2, GERD, breast cancer (right breast lumpectomy x2, completion right axillary LN dissection, s/p chemoradiation, anastozole started 10/2020; abemaciclib 07/2021-04/2022; left internal jugular Port 02/23/20-07/26/20, received rivaroxaban 04/24/20-08/22/20 for left brachiocephalic catheter associated clot), osteoarthritis (left TKA 11/18/21).   Recent cardiology evaluation by Dr. Herbie Baltimore on 03/09/2023.  Medications adjusted for hypertension with orthostasis (likely secondary to autonomic dysfunction from diabetes). HLD controlled on atorvastatin. Echocardiogram ordered to assess soft murmur. 04/06/23 echo showed LVEF 55 to 60%, no regional wall motion abnormalities, mild LVH, normal diastolic parameters, normal RV systolic function, normal PASP, trivial MR, trivial AR, 40 mm ascending aorta.  [facility] admission 04/24/22-04/27/22 for sepsis secondary to RLL pneumonia with hemoptysis.  Pittore cultures grew rare Enterobacter cloacae, rare Pseudomonas aeruginosa and rare Pseudomonas fluorescens.  She clinically improved with IV antibiotics and discharged on levofloxacin.  She had elevated LFTs of unclear etiology but thought possibly secondary to liver shock as she was hypotensive on presentation.  Hepatitis panel was negative.  She also required transfusion with 2 units PRBC for hemoglobin of 6.9.  Since 04/2022 hospital discharge, her HGB has been 9.7-10.6 per [facility] labs. (H/H 10.7/33.0 prior to left TKA in July 2023). MCV 85.1. She is followed by [facility] Dr. Al Pimple for breast cancer primarily and not for anemia, last visit  01/21/23.    I have reached out to Dr. Roda Shutters regarding PAT lab results.   VS: BP (!) 147/76   Pulse 73   Temp 37 C   Resp 17   Ht 5\' 9"  (1.753 m)   Wt 102.4 kg   SpO2 100%   BMI 33.34 kg/m    PROVIDERS: Rema Fendt, NP is PCP Bryan Lemma, MD is cardiologist Rachel Moulds, MD is [facility]   LABS: See DISCUSSION.  (all labs ordered are listed, but only abnormal results are displayed)  Labs Reviewed  SURGICAL PCR SCREEN - Abnormal; Notable for the following components:      Result Value   Staphylococcus aureus POSITIVE (*)    All other components within normal limits  CBC - Abnormal; Notable for the following components:   RBC 3.69 (*)    Hemoglobin 9.8 (*)    HCT 31.4 (*)    RDW 16.4 (*)    All other components within normal limits  BASIC METABOLIC PANEL - Abnormal; Notable for the following components:   Glucose, Bld 109 (*)    Calcium 8.8 (*)    All other components within normal limits  HEMOGLOBIN A1C - Abnormal; Notable for the following components:   Hgb A1c MFr Bld 6.2 (*)    All other components within normal limits  GLUCOSE, CAPILLARY     IMAGES: CXR 02/17/23: FINDINGS: Mild chronic cardiomegaly. Chronic aortic tortuosity. The lungs are clear except for a few small scars. No infiltrate, collapse or effusion. Surgical clips related to the right breast and axilla. IMPRESSION: No active disease. Mild chronic cardiomegaly and aortic tortuosity.   Xray L-spine 02/17/23: IMPRESSION: 1. Scoliotic curvature convex to the right. 2. Degenerative anterolisthesis at L3-4 and L4-5. Lumbar region degenerative disc disease and degenerative facet  disease. Findings could certainly be painful.  CT Chest 04/26/22: IMPRESSION: 1. Ground-glass opacities with associated scattered small patchy consolidations in the inferior aspect of the right lower lobe are favored to reflect pulmonary hemorrhage given clinical history of hemoptysis. No discrete underlying mass  identified. 2. Small ground-glass opacity in the left lower lobe could reflect additional localized hemorrhage versus infectious versus inflammatory process. 3. Age-indeterminate superior endplate compression fracture of the T4 vertebral body. If clinically desired, further assessment regarding the acuity of the compression fracture could be performed with MRI of the thoracic spine. 4. Aortic and coronary artery atherosclerosis.   EKG: 03/06/23:  Sinus bradycardia at 56 bpm Left axis deviation Left ventricular hypertrophy with QRS widening ( R in aVL , Cornell product , Romhilt-Estes ) with repolarization abnormality Nonspecific T wave abnormality When compared with ECG of 24-Apr-2022 16:40, PREVIOUS ECG IS PRESENT Confirmed by Bryan Lemma ([id number]) on 03/09/2023 5:00:07 PM   CV: Echo 04/06/23: IMPRESSIONS   1. Left ventricular ejection fraction, by estimation, is 55 to 60%. The  left ventricle has normal function. The left ventricle has no regional  wall motion abnormalities. There is mild left ventricular hypertrophy.  Left ventricular diastolic parameters  were normal. The average left ventricular global longitudinal strain is  -18.6 %. The global longitudinal strain is normal.   2. Right ventricular systolic function is normal. The right ventricular  size is normal. There is normal pulmonary artery systolic pressure.   3. Left atrial size was mildly dilated.   4. The mitral valve is abnormal. Trivial mitral valve regurgitation. No  evidence of mitral stenosis.   5. The aortic valve is tricuspid. There is mild calcification of the  aortic valve. There is mild thickening of the aortic valve. Aortic valve  regurgitation is trivial. Aortic valve sclerosis is present, with no  evidence of aortic valve stenosis.   6. Aortic dilatation noted. There is mild dilatation of the ascending  aorta, measuring 40 mm.   7. The inferior vena cava is normal in size with greater than 50%   respiratory variability, suggesting right atrial pressure of 3 mmHg.    Past Medical History:  Diagnosis Date   Arthritis    Breast cancer (HCC)    right   Diabetes mellitus without complication (HCC)    GERD (gastroesophageal reflux disease)    Headache    sometimes   History of MRI    Knee, Hip, and Back.   Hypertension    Personal history of chemotherapy    Personal history of radiation therapy     Past Surgical History:  Procedure Laterality Date   BREAST BIOPSY Right 02/03/2020   x2   BREAST BIOPSY Right 02/06/2020   x2   BREAST LUMPECTOMY Right 07/26/2020   BREAST LUMPECTOMY WITH RADIOACTIVE SEED AND SENTINEL LYMPH NODE BIOPSY Right 07/26/2020   Procedure: RIGHT BREAST LUMPECTOMY WITH RADIOACTIVE SEED X 2 AND SENTINEL LYMPH NODE BIOPSY;  Surgeon: Harriette Bouillon, MD;  Location: [facility];  Service: General;  Laterality: Right;   IR IMAGING GUIDED PORT INSERTION  02/23/2020   PORT-A-CATH REMOVAL N/A 07/26/2020   Procedure: REMOVAL PORT-A-CATH;  Surgeon: Harriette Bouillon, MD;  Location: [facility];  Service: General;  Laterality: N/A;   TOTAL KNEE ARTHROPLASTY Left 11/18/2021   Procedure: LEFT TOTAL KNEE ARTHROPLASTY;  Surgeon: [name], MD;  Location: [facility];  Service: Orthopedics;  Laterality: Left;    MEDICATIONS:  anastrozole (ARIMIDEX) 1 MG tablet   aspirin  EC 81 MG tablet   atorvastatin (LIPITOR) 40 MG tablet   calcium-vitamin D (OSCAL WITH D) 500-5 MG-MCG tablet   carvedilol (COREG) 6.25 MG tablet   diclofenac (VOLTAREN) 75 MG EC tablet   lansoprazole (PREVACID) 30 MG capsule   Magnesium 100 MG CAPS   metFORMIN (GLUCOPHAGE) 1000 MG tablet   Multiple Vitamins-Minerals (CENTRUM SILVER 50+WOMEN PO)   UNABLE TO FIND   valsartan (DIOVAN) 40 MG tablet   vitamin C (ASCORBIC ACID) 500 MG tablet   No current facility-administered medications for this encounter.    Shonna Chock, PA-C Surgical Short Stay/Anesthesiology [facility]  Phone [phone] [facility] Phone [phone] 04/06/2023 5:32 PM

## 2023-04-07 NOTE — Anesthesia Preprocedure Evaluation (Addendum)
Anesthesia Evaluation  Patient identified by MRN, date of birth, ID band Patient awake    Reviewed: Allergy & Precautions, NPO status , Patient's Chart, lab work & pertinent test results, reviewed documented beta blocker date and time   Airway Mallampati: I  TM Distance: >3 FB Neck ROM: Full    Dental  (+) Dental Advisory Given, Missing, Chipped   Pulmonary neg pulmonary ROS   Pulmonary exam normal breath sounds clear to auscultation       Cardiovascular hypertension, Pt. on home beta blockers and Pt. on medications (-) angina + Peripheral Vascular Disease  (-) Past MI Normal cardiovascular exam+ Valvular Problems/Murmurs  Rhythm:Regular Rate:Normal  EKG 03/06/23 Sinus bradycardia Left axis deviation Left ventricular hypertrophy with QRS widening ( R in aVL , Cornell product , Romhilt-Estes ) with repolarization abnormality Nonspecific T wave abnormality  Echo 04/06/23 1. Left ventricular ejection fraction, by estimation, is 55 to 60%. The  left ventricle has normal function. The left ventricle has no regional  wall motion abnormalities. There is mild left ventricular hypertrophy.  Left ventricular diastolic parameters  were normal. The average left ventricular global longitudinal strain is  -18.6 %. The global longitudinal strain is normal.   2. Right ventricular systolic function is normal. The right ventricular  size is normal. There is normal pulmonary artery systolic pressure.   3. Left atrial size was mildly dilated.   4. The mitral valve is abnormal. Trivial mitral valve regurgitation. No  evidence of mitral stenosis.   5. The aortic valve is tricuspid. There is mild calcification of the  aortic valve. There is mild thickening of the aortic valve. Aortic valve  regurgitation is trivial. Aortic valve sclerosis is present, with no  evidence of aortic valve stenosis.   6. Aortic dilatation noted. There is mild dilatation  of the ascending  aorta, measuring 40 mm.   7. The inferior vena cava is normal in size with greater than 50%  respiratory variability, suggesting right atrial pressure of 3 mmHg.     Neuro/Psych  Headaches  negative psych ROS   GI/Hepatic Neg liver ROS,GERD  Medicated,,  Endo/Other  diabetes, Well Controlled, Type 2, Oral Hypoglycemic Agents  Obesity HLD   Renal/GU negative Renal ROS     Musculoskeletal  (+) Arthritis , Osteoarthritis,  OA right knee   Abdominal  (+) + obese  Peds  Hematology  (+) Blood dyscrasia, anemia Plt 200k   Anesthesia Other Findings H/o breast cancer   Reproductive/Obstetrics                              Anesthesia Physical Anesthesia Plan  ASA: 2  Anesthesia Plan: Spinal   Post-op Pain Management: Regional block* and Tylenol PO (pre-op)*   Induction: Intravenous  PONV Risk Score and Plan: 3 and Midazolam, Dexamethasone, Ondansetron, TIVA and Treatment may vary due to age or medical condition  Airway Management Planned: Natural Airway, Simple Face Mask and Nasal Cannula  Additional Equipment: None  Intra-op Plan:   Post-operative Plan:   Informed Consent: I have reviewed the patients History and Physical, chart, labs and discussed the procedure including the risks, benefits and alternatives for the proposed anesthesia with the patient or authorized representative who has indicated his/her understanding and acceptance.     Dental advisory given  Plan Discussed with: CRNA  Anesthesia Plan Comments: (PAT note written 04/07/2023 by Shonna Chock, PA-C. Known anemia, H/H 9.8/31.4. Dr. Roda Shutters is aware.  If T&S desired then will need to be ordered.   )         Anesthesia Quick Evaluation

## 2023-04-10 MED ORDER — TRANEXAMIC ACID 1000 MG/10ML IV SOLN
2000.0000 mg | INTRAVENOUS | Status: DC
  Filled 2023-04-10: qty 20

## 2023-04-13 ENCOUNTER — Observation Stay (HOSPITAL_COMMUNITY)
Admission: RE | Admit: 2023-04-13 | Discharge: 2023-04-14 | Disposition: A | Discharge status: Home / Self Care | Payer: Medicaid Other | Attending: Orthopaedic Surgery | Admitting: Orthopaedic Surgery

## 2023-04-13 ENCOUNTER — Encounter (HOSPITAL_COMMUNITY)
Admission: RE | Disposition: A | Discharge status: Home / Self Care | Payer: Self-pay | Source: Home / Self Care | Attending: Orthopaedic Surgery

## 2023-04-13 ENCOUNTER — Encounter (HOSPITAL_COMMUNITY): Payer: Self-pay | Admitting: Orthopaedic Surgery

## 2023-04-13 ENCOUNTER — Ambulatory Visit (HOSPITAL_COMMUNITY): Payer: Medicaid Other | Admitting: Vascular Surgery

## 2023-04-13 ENCOUNTER — Ambulatory Visit (HOSPITAL_COMMUNITY): Payer: Medicaid Other

## 2023-04-13 ENCOUNTER — Other Ambulatory Visit: Payer: Self-pay

## 2023-04-13 ENCOUNTER — Observation Stay (HOSPITAL_COMMUNITY): Payer: Medicaid Other

## 2023-04-13 DIAGNOSIS — Z7984 Long term (current) use of oral hypoglycemic drugs: Secondary | ICD-10-CM | POA: Diagnosis not present

## 2023-04-13 DIAGNOSIS — Z96651 Presence of right artificial knee joint: Secondary | ICD-10-CM

## 2023-04-13 DIAGNOSIS — E119 Type 2 diabetes mellitus without complications: Secondary | ICD-10-CM | POA: Insufficient documentation

## 2023-04-13 DIAGNOSIS — Z79899 Other long term (current) drug therapy: Secondary | ICD-10-CM | POA: Diagnosis not present

## 2023-04-13 DIAGNOSIS — I10 Essential (primary) hypertension: Secondary | ICD-10-CM | POA: Diagnosis not present

## 2023-04-13 DIAGNOSIS — Z7982 Long term (current) use of aspirin: Secondary | ICD-10-CM | POA: Insufficient documentation

## 2023-04-13 DIAGNOSIS — Z96652 Presence of left artificial knee joint: Secondary | ICD-10-CM | POA: Insufficient documentation

## 2023-04-13 DIAGNOSIS — Z853 Personal history of malignant neoplasm of breast: Secondary | ICD-10-CM | POA: Diagnosis not present

## 2023-04-13 DIAGNOSIS — M1711 Unilateral primary osteoarthritis, right knee: Secondary | ICD-10-CM | POA: Diagnosis present

## 2023-04-13 HISTORY — PX: TOTAL KNEE ARTHROPLASTY: SHX125

## 2023-04-13 LAB — GLUCOSE, CAPILLARY
Glucose-Capillary: 106 mg/dL — ABNORMAL HIGH (ref 70–99)
Glucose-Capillary: 105 mg/dL — ABNORMAL HIGH (ref 70–99)
Glucose-Capillary: 224 mg/dL — ABNORMAL HIGH (ref 70–99)
Glucose-Capillary: 199 mg/dL — ABNORMAL HIGH (ref 70–99)

## 2023-04-13 SURGERY — ARTHROPLASTY, KNEE, TOTAL
Anesthesia: Spinal | Site: Knee | Laterality: Right

## 2023-04-13 MED ORDER — VANCOMYCIN HCL 1000 MG IV SOLR
INTRAVENOUS | Status: AC
  Filled 2023-04-13: qty 20

## 2023-04-13 MED ORDER — DOCUSATE SODIUM 100 MG PO CAPS: 100.0000 mg | ORAL_CAPSULE | Freq: Two times a day (BID) | ORAL | 0 refills | Status: AC

## 2023-04-13 MED ORDER — VANCOMYCIN HCL 1000 MG IV SOLR
INTRAVENOUS | Status: DC | PRN
  Administered 2023-04-13: 1000 mg

## 2023-04-13 MED ORDER — MENTHOL 3 MG MT LOZG: 1.0000 | LOZENGE | OROMUCOSAL | Status: DC | PRN

## 2023-04-13 MED ORDER — ONDANSETRON HCL 4 MG/2ML IJ SOLN
INTRAMUSCULAR | Status: AC
  Filled 2023-04-13: qty 2

## 2023-04-13 MED ORDER — OXYCODONE HCL 5 MG PO TABS: 5.0000 mg | ORAL_TABLET | Freq: Once | ORAL | Status: DC | PRN

## 2023-04-13 MED ORDER — POVIDONE-IODINE 10 % EX SWAB
2.0000 | Freq: Once | CUTANEOUS | Status: AC
  Administered 2023-04-13: 2 via TOPICAL

## 2023-04-13 MED ORDER — ACETAMINOPHEN 500 MG PO TABS
1000.0000 mg | ORAL_TABLET | Freq: Four times a day (QID) | ORAL | Status: AC
  Administered 2023-04-13 – 2023-04-14 (×4): 1000 mg via ORAL
  Filled 2023-04-13 (×4): qty 2

## 2023-04-13 MED ORDER — DEXAMETHASONE SODIUM PHOSPHATE 10 MG/ML IJ SOLN
INTRAMUSCULAR | Status: AC
  Filled 2023-04-13: qty 1

## 2023-04-13 MED ORDER — IRBESARTAN 75 MG PO TABS
37.5000 mg | ORAL_TABLET | Freq: Every day | ORAL | Status: DC
  Administered 2023-04-13: 37.5 mg via ORAL
  Filled 2023-04-13: qty 1

## 2023-04-13 MED ORDER — RIVAROXABAN 10 MG PO TABS: 10.0000 mg | ORAL_TABLET | Freq: Every day | ORAL | 0 refills | Status: DC

## 2023-04-13 MED ORDER — SODIUM CHLORIDE 0.9 % IR SOLN
Status: DC | PRN
  Administered 2023-04-13: 3000 mL

## 2023-04-13 MED ORDER — RIVAROXABAN 10 MG PO TABS
10.0000 mg | ORAL_TABLET | Freq: Every day | ORAL | Status: DC
  Administered 2023-04-14: 10 mg via ORAL
  Filled 2023-04-13: qty 1

## 2023-04-13 MED ORDER — TRANEXAMIC ACID-NACL 1000-0.7 MG/100ML-% IV SOLN
1000.0000 mg | INTRAVENOUS | Status: AC
  Administered 2023-04-13: 1000 mg via INTRAVENOUS
  Filled 2023-04-13: qty 100

## 2023-04-13 MED ORDER — CARVEDILOL 6.25 MG PO TABS
6.2500 mg | ORAL_TABLET | Freq: Two times a day (BID) | ORAL | Status: DC
  Administered 2023-04-14: 6.25 mg via ORAL
  Filled 2023-04-13: qty 1

## 2023-04-13 MED ORDER — MIDAZOLAM HCL 2 MG/2ML IJ SOLN
INTRAMUSCULAR | Status: AC
  Administered 2023-04-13: 2 mg via INTRAVENOUS
  Filled 2023-04-13: qty 2

## 2023-04-13 MED ORDER — DOCUSATE SODIUM 100 MG PO CAPS
100.0000 mg | ORAL_CAPSULE | Freq: Two times a day (BID) | ORAL | Status: DC
  Administered 2023-04-13: 100 mg via ORAL
  Filled 2023-04-13: qty 1

## 2023-04-13 MED ORDER — ONDANSETRON HCL 4 MG/2ML IJ SOLN
INTRAMUSCULAR | Status: DC | PRN
  Administered 2023-04-13: 4 mg via INTRAVENOUS

## 2023-04-13 MED ORDER — FENTANYL CITRATE (PF) 250 MCG/5ML IJ SOLN
INTRAMUSCULAR | Status: AC
  Filled 2023-04-13: qty 5

## 2023-04-13 MED ORDER — HYDROMORPHONE HCL 1 MG/ML IJ SOLN
0.5000 mg | INTRAMUSCULAR | Status: DC | PRN
Start: 2023-04-13 — End: 2023-04-14

## 2023-04-13 MED ORDER — TRANEXAMIC ACID-NACL 1000-0.7 MG/100ML-% IV SOLN
1000.0000 mg | Freq: Once | INTRAVENOUS | Status: AC
  Administered 2023-04-13: 1000 mg via INTRAVENOUS
  Filled 2023-04-13: qty 100

## 2023-04-13 MED ORDER — FENTANYL CITRATE (PF) 100 MCG/2ML IJ SOLN: 50.0000 ug | Freq: Once | INTRAMUSCULAR | Status: AC

## 2023-04-13 MED ORDER — ASPIRIN 81 MG PO CHEW
81.0000 mg | CHEWABLE_TABLET | Freq: Once | ORAL | Status: AC
  Administered 2023-04-13: 81 mg via ORAL
  Filled 2023-04-13: qty 1

## 2023-04-13 MED ORDER — 0.9 % SODIUM CHLORIDE (POUR BTL) OPTIME
TOPICAL | Status: DC | PRN
  Administered 2023-04-13: 1000 mL

## 2023-04-13 MED ORDER — TRANEXAMIC ACID 1000 MG/10ML IV SOLN
INTRAVENOUS | Status: DC | PRN
  Administered 2023-04-13: 2000 mg via TOPICAL

## 2023-04-13 MED ORDER — METOCLOPRAMIDE HCL 5 MG/ML IJ SOLN: 5.0000 mg | Freq: Three times a day (TID) | INTRAMUSCULAR | Status: DC | PRN

## 2023-04-13 MED ORDER — PHENYLEPHRINE HCL-NACL 20-0.9 MG/250ML-% IV SOLN
INTRAVENOUS | Status: DC | PRN
  Administered 2023-04-13: 60 ug/min via INTRAVENOUS

## 2023-04-13 MED ORDER — INSULIN ASPART 100 UNIT/ML IJ SOLN: 0.0000 [IU] | Freq: Three times a day (TID) | INTRAMUSCULAR | Status: DC

## 2023-04-13 MED ORDER — FENTANYL CITRATE (PF) 100 MCG/2ML IJ SOLN
INTRAMUSCULAR | Status: AC
  Administered 2023-04-13: 50 ug via INTRAVENOUS
  Filled 2023-04-13: qty 2

## 2023-04-13 MED ORDER — OXYCODONE HCL 5 MG PO TABS
5.0000 mg | ORAL_TABLET | ORAL | Status: DC | PRN
  Filled 2023-04-13: qty 2

## 2023-04-13 MED ORDER — ORAL CARE MOUTH RINSE: 15.0000 mL | Freq: Once | OROMUCOSAL | Status: AC

## 2023-04-13 MED ORDER — METOCLOPRAMIDE HCL 5 MG PO TABS: 5.0000 mg | ORAL_TABLET | Freq: Three times a day (TID) | ORAL | Status: DC | PRN

## 2023-04-13 MED ORDER — ROPIVACAINE HCL 5 MG/ML IJ SOLN
INTRAMUSCULAR | Status: DC | PRN
  Administered 2023-04-13: 30 mL via PERINEURAL

## 2023-04-13 MED ORDER — PROPOFOL 10 MG/ML IV BOLUS
INTRAVENOUS | Status: AC
  Filled 2023-04-13: qty 20

## 2023-04-13 MED ORDER — OXYCODONE HCL ER 10 MG PO T12A
10.0000 mg | EXTENDED_RELEASE_TABLET | Freq: Two times a day (BID) | ORAL | Status: DC
  Administered 2023-04-13 (×2): 10 mg via ORAL
  Filled 2023-04-13 (×2): qty 1

## 2023-04-13 MED ORDER — METHOCARBAMOL 1000 MG/10ML IJ SOLN: 500.0000 mg | Freq: Four times a day (QID) | INTRAMUSCULAR | Status: DC | PRN

## 2023-04-13 MED ORDER — KETOROLAC TROMETHAMINE 15 MG/ML IJ SOLN
7.5000 mg | Freq: Four times a day (QID) | INTRAMUSCULAR | Status: AC
  Administered 2023-04-13 – 2023-04-14 (×4): 7.5 mg via INTRAVENOUS
  Filled 2023-04-13 (×4): qty 1

## 2023-04-13 MED ORDER — MIDAZOLAM HCL 2 MG/2ML IJ SOLN: 2.0000 mg | Freq: Once | INTRAMUSCULAR | Status: AC

## 2023-04-13 MED ORDER — PROPOFOL 500 MG/50ML IV EMUL
INTRAVENOUS | Status: DC | PRN
  Administered 2023-04-13: 25 ug/kg/min via INTRAVENOUS

## 2023-04-13 MED ORDER — CEFAZOLIN SODIUM-DEXTROSE 2-4 GM/100ML-% IV SOLN
2.0000 g | Freq: Four times a day (QID) | INTRAVENOUS | Status: AC
  Administered 2023-04-13 (×2): 2 g via INTRAVENOUS
  Filled 2023-04-13 (×2): qty 100

## 2023-04-13 MED ORDER — ONDANSETRON HCL 4 MG PO TABS: 4.0000 mg | ORAL_TABLET | Freq: Four times a day (QID) | ORAL | Status: DC | PRN

## 2023-04-13 MED ORDER — BUPIVACAINE-MELOXICAM ER 400-12 MG/14ML IJ SOLN
INTRAMUSCULAR | Status: DC | PRN
  Administered 2023-04-13: 14 mL

## 2023-04-13 MED ORDER — PHENYLEPHRINE 80 MCG/ML (10ML) SYRINGE FOR IV PUSH (FOR BLOOD PRESSURE SUPPORT)
PREFILLED_SYRINGE | INTRAVENOUS | Status: DC | PRN
  Administered 2023-04-13: 80 ug via INTRAVENOUS
  Administered 2023-04-13 (×3): 160 ug via INTRAVENOUS

## 2023-04-13 MED ORDER — OXYCODONE HCL 5 MG PO TABS: 5.0000 mg | ORAL_TABLET | Freq: Three times a day (TID) | ORAL | 0 refills | Status: DC | PRN

## 2023-04-13 MED ORDER — ONDANSETRON HCL 4 MG/2ML IJ SOLN: 4.0000 mg | Freq: Four times a day (QID) | INTRAMUSCULAR | Status: DC | PRN

## 2023-04-13 MED ORDER — OXYCODONE HCL 5 MG/5ML PO SOLN
5.0000 mg | Freq: Once | ORAL | Status: DC | PRN
Start: 2023-04-13 — End: 2023-04-13

## 2023-04-13 MED ORDER — INSULIN ASPART 100 UNIT/ML IJ SOLN: 0.0000 [IU] | Freq: Every day | INTRAMUSCULAR | Status: DC

## 2023-04-13 MED ORDER — ONDANSETRON HCL 4 MG/2ML IJ SOLN: 4.0000 mg | Freq: Once | INTRAMUSCULAR | Status: DC | PRN

## 2023-04-13 MED ORDER — CHLORHEXIDINE GLUCONATE 0.12 % MT SOLN
15.0000 mL | Freq: Once | OROMUCOSAL | Status: AC
  Administered 2023-04-13: 15 mL via OROMUCOSAL
  Filled 2023-04-13: qty 15

## 2023-04-13 MED ORDER — CEFAZOLIN SODIUM-DEXTROSE 2-4 GM/100ML-% IV SOLN
2.0000 g | INTRAVENOUS | Status: AC
  Administered 2023-04-13: 2 g via INTRAVENOUS
  Filled 2023-04-13: qty 100

## 2023-04-13 MED ORDER — LACTATED RINGERS IV SOLN: INTRAVENOUS | Status: DC

## 2023-04-13 MED ORDER — DOXYCYCLINE HYCLATE 100 MG PO CAPS: 100.0000 mg | ORAL_CAPSULE | Freq: Two times a day (BID) | ORAL | 0 refills | Status: DC

## 2023-04-13 MED ORDER — DEXAMETHASONE SODIUM PHOSPHATE 10 MG/ML IJ SOLN
10.0000 mg | Freq: Once | INTRAMUSCULAR | Status: AC
  Administered 2023-04-14: 10 mg via INTRAVENOUS
  Filled 2023-04-13: qty 1

## 2023-04-13 MED ORDER — ACETAMINOPHEN 325 MG PO TABS: 325.0000 mg | ORAL_TABLET | Freq: Four times a day (QID) | ORAL | Status: DC | PRN

## 2023-04-13 MED ORDER — OXYCODONE HCL 5 MG PO TABS
10.0000 mg | ORAL_TABLET | ORAL | Status: DC | PRN
Start: 2023-04-13 — End: 2023-04-14
  Administered 2023-04-14: 10 mg via ORAL

## 2023-04-13 MED ORDER — PHENYLEPHRINE 80 MCG/ML (10ML) SYRINGE FOR IV PUSH (FOR BLOOD PRESSURE SUPPORT)
PREFILLED_SYRINGE | INTRAVENOUS | Status: AC
  Filled 2023-04-13: qty 10

## 2023-04-13 MED ORDER — PROPOFOL 10 MG/ML IV BOLUS
INTRAVENOUS | Status: DC | PRN
  Administered 2023-04-13: 30 mg via INTRAVENOUS
  Administered 2023-04-13: 3 mg via INTRAVENOUS

## 2023-04-13 MED ORDER — HYDROMORPHONE HCL 1 MG/ML IJ SOLN: 0.2500 mg | INTRAMUSCULAR | Status: DC | PRN

## 2023-04-13 MED ORDER — PRONTOSAN WOUND IRRIGATION OPTIME
TOPICAL | Status: DC | PRN
  Administered 2023-04-13: 350 mL via TOPICAL

## 2023-04-13 MED ORDER — DEXAMETHASONE SODIUM PHOSPHATE 10 MG/ML IJ SOLN
INTRAMUSCULAR | Status: DC | PRN
  Administered 2023-04-13: 10 mg via INTRAVENOUS

## 2023-04-13 MED ORDER — PHENOL 1.4 % MT LIQD: 1.0000 | OROMUCOSAL | Status: DC | PRN

## 2023-04-13 MED ORDER — ONDANSETRON HCL 4 MG PO TABS: 4.0000 mg | ORAL_TABLET | Freq: Four times a day (QID) | ORAL | 0 refills | Status: AC | PRN

## 2023-04-13 MED ORDER — PROPOFOL 1000 MG/100ML IV EMUL
INTRAVENOUS | Status: AC
  Filled 2023-04-13: qty 100

## 2023-04-13 MED ORDER — METHOCARBAMOL 500 MG PO TABS
500.0000 mg | ORAL_TABLET | Freq: Four times a day (QID) | ORAL | Status: DC | PRN
  Administered 2023-04-13: 500 mg via ORAL
  Filled 2023-04-13: qty 1

## 2023-04-13 MED ORDER — METHOCARBAMOL 500 MG PO TABS: 500.0000 mg | ORAL_TABLET | Freq: Four times a day (QID) | ORAL | 2 refills | Status: AC | PRN

## 2023-04-13 MED ORDER — BUPIVACAINE-MELOXICAM ER 400-12 MG/14ML IJ SOLN
INTRAMUSCULAR | Status: AC
  Filled 2023-04-13: qty 1

## 2023-04-13 SURGICAL SUPPLY — 75 items
ALCOHOL 70% 16 OZ (MISCELLANEOUS) ×1 IMPLANT
BAG COUNTER SPONGE SURGICOUNT (BAG) IMPLANT
BAG DECANTER FOR FLEXI CONT (MISCELLANEOUS) ×1 IMPLANT
BANDAGE ESMARK 6X9 LF (GAUZE/BANDAGES/DRESSINGS) IMPLANT
BLADE SAG 18X100X1.27 (BLADE) ×1 IMPLANT
BLADE SAW SGTL 73X25 THK (BLADE) ×1 IMPLANT
BNDG ESMARK 6X9 LF (GAUZE/BANDAGES/DRESSINGS) IMPLANT
BOWL SMART MIX CTS (DISPOSABLE) ×1 IMPLANT
CEMENT BONE REFOBACIN R1X40 US (Cement) IMPLANT
CLSR STERI-STRIP ANTIMIC 1/2X4 (GAUZE/BANDAGES/DRESSINGS) ×2 IMPLANT
COMP FEM CMT PERSONA SZ9 RT (Joint) ×1 IMPLANT
COMPONENT FEM CMT PRSONA SZ9RT (Joint) IMPLANT
COOLER ICEMAN CLASSIC (MISCELLANEOUS) ×1 IMPLANT
COVER SURGICAL LIGHT HANDLE (MISCELLANEOUS) ×1 IMPLANT
CUFF TOURN SGL QUICK 42 (TOURNIQUET CUFF) IMPLANT
CUFF TRNQT CYL 34X4.125X (TOURNIQUET CUFF) ×1 IMPLANT
DERMABOND ADVANCED .7 DNX12 (GAUZE/BANDAGES/DRESSINGS) ×1 IMPLANT
DRAPE EXTREMITY T 121X128X90 (DISPOSABLE) ×1 IMPLANT
DRAPE HALF SHEET 40X57 (DRAPES) ×1 IMPLANT
DRAPE INCISE IOBAN 66X45 STRL (DRAPES) ×1 IMPLANT
DRAPE POUCH INSTRU U-SHP 10X18 (DRAPES) ×1 IMPLANT
DRAPE SURG ORHT 6 SPLT 77X108 (DRAPES) IMPLANT
DRAPE U-SHAPE 47X51 STRL (DRAPES) ×2 IMPLANT
DRSG AQUACEL AG ADV 3.5X10 (GAUZE/BANDAGES/DRESSINGS) ×1 IMPLANT
DURAPREP 26ML APPLICATOR (WOUND CARE) ×3 IMPLANT
ELECT CAUTERY BLADE 6.4 (BLADE) ×1 IMPLANT
ELECT PENCIL ROCKER SW 15FT (MISCELLANEOUS) ×1 IMPLANT
ELECT REM PT RETURN 9FT ADLT (ELECTROSURGICAL) ×1 IMPLANT
ELECTRODE REM PT RTRN 9FT ADLT (ELECTROSURGICAL) ×1 IMPLANT
GLOVE BIOGEL PI IND STRL 7.0 (GLOVE) ×2 IMPLANT
GLOVE BIOGEL PI IND STRL 7.5 (GLOVE) ×5 IMPLANT
GLOVE ECLIPSE 7.0 STRL STRAW (GLOVE) ×3 IMPLANT
GLOVE INDICATOR 7.0 STRL GRN (GLOVE) ×1 IMPLANT
GLOVE INDICATOR 7.5 STRL GRN (GLOVE) ×1 IMPLANT
GLOVE SURG SYN 7.5 E (GLOVE) ×2 IMPLANT
GLOVE SURG SYN 7.5 PF PI (GLOVE) ×2 IMPLANT
GLOVE SURG UNDER LTX SZ7.5 (GLOVE) ×2 IMPLANT
GLOVE SURG UNDER POLY LF SZ7 (GLOVE) ×2 IMPLANT
GOWN STRL REUS W/ TWL LRG LVL3 (GOWN DISPOSABLE) ×1 IMPLANT
GOWN STRL SURGICAL XL XLNG (GOWN DISPOSABLE) ×1 IMPLANT
GOWN TOGA ZIPPER T7+ PEEL AWAY (MISCELLANEOUS) ×2 IMPLANT
HOOD PEEL AWAY T7 (MISCELLANEOUS) ×1 IMPLANT
INSERT TIB BEAR EF/8-11 16 RT (Insert) IMPLANT
KIT BASIN OR (CUSTOM PROCEDURE TRAY) ×1 IMPLANT
KIT TURNOVER KIT B (KITS) ×1 IMPLANT
MANIFOLD NEPTUNE II (INSTRUMENTS) ×1 IMPLANT
MARKER SKIN DUAL TIP RULER LAB (MISCELLANEOUS) ×2 IMPLANT
NDL SPNL 18GX3.5 QUINCKE PK (NEEDLE) ×1 IMPLANT
NEEDLE SPNL 18GX3.5 QUINCKE PK (NEEDLE) ×1 IMPLANT
NS IRRIG 1000ML POUR BTL (IV SOLUTION) ×1 IMPLANT
PACK TOTAL JOINT (CUSTOM PROCEDURE TRAY) ×1 IMPLANT
PAD ARMBOARD 7.5X6 YLW CONV (MISCELLANEOUS) ×2 IMPLANT
PAD COLD SHLDR WRAP-ON (PAD) ×1 IMPLANT
PIN DRILL HDLS TROCAR 75 4PK (PIN) IMPLANT
SCREW FEMALE HEX FIX 25X2.5 (ORTHOPEDIC DISPOSABLE SUPPLIES) IMPLANT
SET HNDPC FAN SPRY TIP SCT (DISPOSABLE) ×1 IMPLANT
SOLUTION PRONTOSAN WOUND 350ML (IRRIGATION / IRRIGATOR) ×1 IMPLANT
STAPLER VISISTAT 35W (STAPLE) IMPLANT
STEM POLY PAT PLY 32M KNEE (Knees) IMPLANT
STEM TIB ST PERS 14+30 (Stem) IMPLANT
STEM TIBIA 5 DEG SZ F R KNEE (Knees) IMPLANT
SUCTION TUBE FRAZIER 10FR DISP (SUCTIONS) ×1 IMPLANT
SUT ETHILON 2 0 FS 18 (SUTURE) IMPLANT
SUT MNCRL AB 3-0 PS2 27 (SUTURE) IMPLANT
SUT VIC AB 0 CT1 27XBRD ANBCTR (SUTURE) ×2 IMPLANT
SUT VIC AB 1 CTX 27 (SUTURE) ×3 IMPLANT
SUT VIC AB 2-0 CT1 TAPERPNT 27 (SUTURE) ×4 IMPLANT
SYR 50ML LL SCALE MARK (SYRINGE) ×2 IMPLANT
TIBIA STEM 5 DEG SZ F R KNEE (Knees) ×1 IMPLANT
TOWEL GREEN STERILE (TOWEL DISPOSABLE) ×1 IMPLANT
TOWEL GREEN STERILE FF (TOWEL DISPOSABLE) ×1 IMPLANT
TRAY CATH INTERMITTENT SS 16FR (CATHETERS) IMPLANT
TUBE SUCT ARGYLE STRL (TUBING) ×1 IMPLANT
UNDERPAD 30X36 HEAVY ABSORB (UNDERPADS AND DIAPERS) ×1 IMPLANT
YANKAUER SUCT BULB TIP NO VENT (SUCTIONS) ×2 IMPLANT

## 2023-04-13 NOTE — Anesthesia Procedure Notes (Addendum)
  Anesthesia Regional Block: Adductor canal block   Pre-Anesthetic Checklist: , timeout performed,  Correct Patient, Correct Site, Correct Laterality,  Correct Procedure, Correct Position, site marked,  Risks and benefits discussed,  Surgical consent,  Pre-op evaluation,  At surgeon's request and post-op pain management  Laterality: Right  Prep: chloraprep       Needles:  Injection technique: Single-shot  Needle Type: Echogenic Stimulator Needle     Needle Length: 10cm  Needle Gauge: 21   Needle insertion depth: 7 cm   Additional Needles:   Procedures:,,,, ultrasound used (permanent image in chart),,    Narrative:  Start time: 04/13/2023 8:07 AM End time: 04/13/2023 8:12 AM Injection made incrementally with aspirations every 5 mL.  Performed by: Personally  Anesthesiologist: Mal Amabile, MD  Additional Notes: Timeout performed. Patient sedated. Relevant anatomy ID'd using [place]. Incremental 2-46ml injection of LA with frequent aspiration. Patient tolerated procedure well.

## 2023-04-13 NOTE — Anesthesia Procedure Notes (Signed)
Spinal  Patient location during procedure: OR Start time: 04/13/2023 8:52 AM End time: 04/13/2023 8:56 AM Reason for block: surgical anesthesia Staffing Performed: anesthesiologist  Anesthesiologist: Mal Amabile, MD Performed by: Mal Amabile, MD Authorized by: Mal Amabile, MD   Preanesthetic Checklist Completed: patient identified, IV checked, site marked, risks and benefits discussed, surgical consent, monitors and equipment checked, pre-op evaluation and timeout performed Spinal Block Patient position: sitting Prep: DuraPrep and site prepped and draped Patient monitoring: heart rate, cardiac monitor, continuous pulse ox and blood pressure Approach: midline Location: L3-4 Injection technique: single-shot Needle Needle type: Pencan  Needle gauge: 24 G Needle length: 9 cm Needle insertion depth: 8 cm Assessment Sensory level: T4 Events: CSF return Additional Notes Patient tolerated procedure well. Adequate sensory level.

## 2023-04-13 NOTE — Discharge Instructions (Signed)

## 2023-04-13 NOTE — Op Note (Signed)
Total Knee Arthroplasty Procedure Note  Preoperative diagnosis: Right knee osteoarthritis  Postoperative diagnosis:same  Operative findings: Complete loss joint space from all 3 compartments Varus deformity, osteopenic bone  Operative procedure: Right total knee arthroplasty. CPT [phone]  Surgeon: N. Glee Arvin, MD  Assist: Oneal Grout, PA-C; necessary for the timely completion of procedure and due to complexity of procedure.  Anesthesia: Spinal, regional, local  Tourniquet time: see anesthesia record  Implants used: Zimmer persona Femur: CR 9 Tibia: F with 30 mm cemented stem Patella: 32 mm Polyethylene: 16 mm medial congruent  Indication: [name] [name] is a 67 y.o. year old female with a history of knee pain. Having failed conservative management, the patient elected to proceed with a total knee arthroplasty.  We have reviewed the risk and benefits of the surgery and they elected to proceed after voicing understanding.  Procedure:  After informed consent was obtained and understanding of the risk were voiced including but not limited to bleeding, infection, damage to surrounding structures including nerves and vessels, blood clots, leg length inequality and the failure to achieve desired results, the operative extremity was marked with verbal confirmation of the patient in the holding area.   The patient was then brought to the operating room and transported to the operating room table in the supine position.  A tourniquet was applied to the operative extremity around the upper thigh. The operative limb was then prepped and draped in the usual sterile fashion and preoperative antibiotics were administered.  A time out was performed prior to the start of surgery confirming the correct extremity, preoperative antibiotic administration, as well as team members, implants and instruments available for the case. Correct surgical site was also confirmed with preoperative  radiographs. The limb was then elevated for exsanguination and the tourniquet was inflated. A midline incision was made and a standard medial parapatellar approach was performed.  The infrapatellar fat pad was removed.  Suprapatellar synovium was removed to reveal the anterior distal femoral cortex.  A medial peel was performed to release the capsule and the deep MCL off of the medial tibial plateau back to the semimembranosus.  The patella was then everted which showed complete loss of articular cartilage and was prepared and sized to a 32 mm.  A cover was placed on the patella for protection from retractors.  The knee was then brought into full flexion and we then turned our attention to the femur.  The ACL was sacrificed.  Start site was drilled in the femur and the intramedullary distal femoral cutting guide was placed, set at 3 degrees valgus, taking 10 mm of distal resection. The distal cut was made. Osteophytes were then removed.  Next, the proximal tibial cutting guide was placed with appropriate slope, varus/valgus alignment and depth of resection.  The drop rod was attached to confirm that it was aimed at the second metatarsal.  The proximal tibial cut was made taking 10 mm off the high lateral side. Gap blocks were then used to assess the extension gap and alignment, and appropriate soft tissue releases were performed. Attention was turned back to the femur, which was sized using the sizing guide to a size 9. Appropriate rotation of the femoral component was determined using epicondylar axis, Whiteside's line, and assessing the flexion gap under ligament tension. The appropriate size 4-in-1 cutting block was placed and checked with an angel wing and cuts were made. Posterior femoral osteophytes and uncapped bone were then removed with the curved osteotome.  The menisci were removed.  Trial components were placed, and stability was checked in full extension, mid-flexion, and deep flexion.  PCL was  sacrificed. Proper tibial rotation was determined and marked.  The patella tracked well with a limited lateral release.  The femoral lugs were then drilled. Trial components were then removed and tibial preparation performed.  The trial tibia was pointed to the medial third of the tibial tubercle.  The tibia was sized for a size F component.   The bony surfaces were irrigated with a pulse lavage and then dried. Bone cement was vacuum mixed on the back table, and the final components sized above were cemented into place.  Antibiotic irrigation was placed in the knee joint and soft tissues while the cement cured.  After cement had finished curing, excess cement was removed. The stability of the construct was re-evaluated throughout a range of motion and found to be acceptable. The trial liner was removed, the knee was copiously irrigated, and the knee was re-evaluated for any excess bone debris. The real polyethylene liner, 16 mm thick, was inserted and checked to ensure the locking mechanism had engaged appropriately. The tourniquet was deflated and hemostasis was achieved. The wound was irrigated with normal saline.  One gram of vancomycin powder was placed in the surgical bed.  Topical mixture of 0.25% bupivacaine and meloxicam was placed in the joint for postoperative pain.  Capsular closure was performed with a #1 vicryl in flexion, subcutaneous fat closed with a 0 vicryl suture, then subcutaneous tissue closed with interrupted 2.0 vicryl suture. The skin was then closed with a 2.0 nylon and dermabond. A sterile dressing was applied.  The patient was awakened in the operating room and taken to recovery in stable condition. All sponge, needle, and instrument counts were correct at the end of the case.  Tessa Lerner was necessary for opening, closing, retracting, limb positioning and overall facilitation and completion of the surgery.  Position: supine  Complications: none.  Time Out: performed    Drains/Packing: none  Estimated blood loss: minimal  Returned to Recovery Room: in good condition.   Antibiotics: yes   Mechanical VTE (DVT) Prophylaxis: sequential compression devices, TED thigh-high  Chemical VTE (DVT) Prophylaxis: aspirin POD 0, xarelto POD 1  Fluid Replacement  Crystalloid: see anesthesia record Blood: none  FFP: none   Specimens Removed: 1 to pathology   Sponge and Instrument Count Correct? yes   PACU: portable radiograph - knee AP and Lateral   Plan/RTC: Return in 2 weeks for wound check.   Weight Bearing/Load Lower Extremity: full   Implant Name Type Inv. Item Serial No. Manufacturer Lot No. LRB No. Used Action  CEMENT BONE REFOBACIN [id number] [place] - [id number] Cement CEMENT BONE REFOBACIN R1X40 [place]  ZIMMER RECON(ORTH,TRAU,BIO,SG) [id number] Right 1 Implanted  STEM TIB ST PERS 14+30 - [id number] Stem STEM TIB ST PERS 14+30  ZIMMER RECON(ORTH,TRAU,BIO,SG)  Right 1 Implanted  STEM POLY PAT PLY 63M KNEE - [id number] Knees STEM POLY PAT PLY 63M KNEE  ZIMMER RECON(ORTH,TRAU,BIO,SG)  Right 1 Implanted  INSERT TIB BEAR EF/8-11 16 RT - [id number] Insert INSERT TIB BEAR EF/8-11 16 RT  ZIMMER RECON(ORTH,TRAU,BIO,SG)  Right 1 Implanted  TIBIA STEM 5 DEG SZ F R KNEE - [id number] Knees TIBIA STEM 5 DEG SZ F R KNEE  ZIMMER RECON(ORTH,TRAU,BIO,SG)  Right 1 Implanted  COMP FEM CMT PERSONA SZ9 RT - [id number] Joint COMP FEM CMT PERSONA SZ9 RT  ZIMMER RECON(ORTH,TRAU,BIO,SG)  Right 1 Implanted    N.  Glee Arvin, MD [facility] 10:39 AM

## 2023-04-13 NOTE — Anesthesia Postprocedure Evaluation (Addendum)
Anesthesia Post Note  [name]  Procedure(s) Performed: RIGHT TOTAL KNEE ARTHROPLASTY (Right: Knee)     Patient location during evaluation: PACU Anesthesia Type: Spinal Level of consciousness: oriented and awake and alert Pain management: pain level controlled Vital Signs Assessment: post-procedure vital signs reviewed and stable Respiratory status: spontaneous breathing, respiratory function stable and nonlabored ventilation Cardiovascular status: blood pressure returned to baseline and stable Postop Assessment: no headache, no backache, no apparent nausea or vomiting, spinal receding and patient able to bend at knees Anesthetic complications: no   No notable events documented.  Last Vitals:  Vitals:   04/13/23 1200 04/13/23 1215  BP: 133/72 (!) 145/92  Pulse: 80 61  Resp: 14 17  Temp:    SpO2: 100% 99%    Last Pain:  Vitals:   04/13/23 1145  TempSrc:   PainSc: 0-No pain                 Amaya Blakeman A.

## 2023-04-13 NOTE — Progress Notes (Signed)
Pt was pre-arranged for home health with Iantha Fallen from MD office. Information on the AVS.

## 2023-04-13 NOTE — Transfer of Care (Signed)
Immediate Anesthesia Transfer of Care Note  [name]  Procedure(s) Performed: RIGHT TOTAL KNEE ARTHROPLASTY (Right: Knee)  Patient Location: PACU  Anesthesia Type:Spinal and MAC combined with regional for post-op pain  Level of Consciousness: drowsy  Airway & Oxygen Therapy: Patient Spontanous Breathing, Patient connected to face mask oxygen, and Patient connected to face mask  Post-op Assessment: Report given to RN, Post -op Vital signs reviewed and stable, and Patient moving all extremities X 4  Post vital signs: Reviewed and stable  Last Vitals:  Vitals Value Taken Time  BP 129/75 04/13/23 1117  Temp    Pulse 68 04/13/23 1119  Resp 19 04/13/23 1119  SpO2 100 % 04/13/23 1119  Vitals shown include unfiled device data.  Last Pain:  Vitals:   04/13/23 0638  TempSrc: Oral  PainSc:       Patients Stated Pain Goal: 2 (04/13/23 1610)  Complications: No notable events documented.

## 2023-04-13 NOTE — H&P (Signed)
PREOPERATIVE H&P  Chief Complaint: right  knee osteoarthritis  HPI: [name] [name] is a 67 y.o. female who presents for surgical treatment of right  knee osteoarthritis.  She denies any changes in medical history.  Past Surgical History:  Procedure Laterality Date   BREAST BIOPSY Right 02/03/2020   x2   BREAST BIOPSY Right 02/06/2020   x2   BREAST LUMPECTOMY Right 07/26/2020   BREAST LUMPECTOMY WITH RADIOACTIVE SEED AND SENTINEL LYMPH NODE BIOPSY Right 07/26/2020   Procedure: RIGHT BREAST LUMPECTOMY WITH RADIOACTIVE SEED X 2 AND SENTINEL LYMPH NODE BIOPSY;  Surgeon: Harriette Bouillon, MD;  Location: [facility];  Service: General;  Laterality: Right;   IR IMAGING GUIDED PORT INSERTION  02/23/2020   PORT-A-CATH REMOVAL N/A 07/26/2020   Procedure: REMOVAL PORT-A-CATH;  Surgeon: Harriette Bouillon, MD;  Location:  SURGERY CENTER;  Service: General;  Laterality: N/A;   TOTAL KNEE ARTHROPLASTY Left 11/18/2021   Procedure: LEFT TOTAL KNEE ARTHROPLASTY;  Surgeon: [name], MD;  Location: [facility];  Service: Orthopedics;  Laterality: Left;   Social History   Socioeconomic History   Marital status: Single    Spouse name: Not on file   Number of children: 1   Years of education: Not on file   Highest education level: 9th grade  Occupational History   Not on file  Tobacco Use   Smoking status: Never   Smokeless tobacco: Never  Vaping Use   Vaping status: Never Used  Substance and Sexual Activity   Alcohol use: Yes    Alcohol/week: 1.0 standard drink of alcohol    Types: 1 Glasses of wine per week   Drug use: Never   Sexual activity: Not Currently  Other Topics Concern   Not on file  Social History Narrative   Tobacco use, amount per day now:   Past tobacco use, amount per day:   How many years did you use tobacco:   Alcohol use (drinks per week): 1 drink occasionally.   Diet:   Do you drink/eat things with caffeine: Yes   Marital status:  Single                                 What year were you married?   Do you live in a house, apartment, assisted living, condo, trailer, etc.? House   Is it one or more stories? One   How many persons live in your home? 5   Do you have pets in your home?( please list) Cat   Highest Level of education completed: High School.   Current or past profession: Psychologist, sport and exercise.   Do you exercise?  Yes                                   Type and how often? 2 times weekly.   Do you have a living will? No   Do you have a DNR form?    No                               If not, do you want to discuss one?   Do you have signed POA/HPOA forms?     No  If so, please bring to you appointment    Do you have any difficulty bathing or dressing yourself? No   Do you have difficulty preparing food or eating? No   Do you have difficulty managing your medications? No   Do you have any difficulty managing your finances? No   Do you have any difficulty affording your medications? No          Social Determinants of Health   Financial Resource Strain: Not on file  Food Insecurity: No Food Insecurity (04/25/2022)   Hunger Vital Sign    Worried About Running Out of Food in the Last Year: Never true    Ran Out of Food in the Last Year: Never true  Transportation Needs: No Transportation Needs (04/25/2022)   PRAPARE - Administrator, Civil Service (Medical): No    Lack of Transportation (Non-Medical): No  Physical Activity: Not on file  Stress: Not on file  Social Connections: Not on file   Family History  Problem Relation Age of Onset   Asthma Mother    Arthritis Sister    Asthma Brother    Arthritis Brother    Allergies  Allergen Reactions   Sulfa Antibiotics Itching   Prior to Admission medications   Medication Sig Start Date End Date Taking? Authorizing Provider  anastrozole (ARIMIDEX) 1 MG tablet Take 1 tablet (1 mg total) by mouth daily. 04/11/22  Yes Rachel Moulds, MD  aspirin EC 81  MG tablet Take 81 mg by mouth daily. Swallow whole.   Yes [provider]  calcium-vitamin D (OSCAL WITH D) 500-5 MG-MCG tablet Take 1 tablet by mouth daily.   Yes [provider]  diclofenac (VOLTAREN) 75 MG EC tablet Take 1 tablet (75 mg total) by mouth 2 (two) times daily. 01/19/23  Yes Zonia Kief, Amy J, NP  lansoprazole (PREVACID) 30 MG capsule Take 1 capsule (30 mg total) by mouth every morning. 01/19/23  Yes Zonia Kief, Amy J, NP  Magnesium 100 MG CAPS Take 100 mg by mouth daily.   Yes [provider]  metFORMIN (GLUCOPHAGE) 1000 MG tablet Take 1 tablet (1,000 mg total) by mouth 2 (two) times daily with a meal. 01/06/23 04/13/23 Yes Zonia Kief, Amy J, NP  Multiple Vitamins-Minerals (CENTRUM SILVER 50+WOMEN PO) Take 1 capsule by mouth daily.   Yes [provider]  UNABLE TO FIND Take 1 Scoop by mouth daily. Med Name: [facility]   Yes [provider]  valsartan (DIOVAN) 40 MG tablet Take 1 tablet (40 mg total) by mouth daily. 03/06/23  Yes Marykay Lex, MD  vitamin C (ASCORBIC ACID) 500 MG tablet Take 500 mg by mouth daily.   Yes [provider]  atorvastatin (LIPITOR) 40 MG tablet Take 1 tablet (40 mg total) by mouth daily. 10/21/22   Ngetich, Dinah C, NP  carvedilol (COREG) 6.25 MG tablet Take 1 tablet (6.25 mg total) by mouth 2 (two) times daily with a meal. Patient not taking: Reported on 04/01/2023 11/03/22   Ngetich, Dinah C, NP     Positive ROS: All other systems have been reviewed and were otherwise negative with the exception of those mentioned in the HPI and as above.  Physical Exam: General: Alert, no acute distress Cardiovascular: No pedal edema Respiratory: No cyanosis, no use of accessory musculature GI: abdomen soft Skin: No lesions in the area of chief complaint Neurologic: Sensation intact distally Psychiatric: Patient is competent for consent with normal mood and affect Lymphatic: no lymphedema  MUSCULOSKELETAL: exam  stable  Assessment: right  knee osteoarthritis  Plan: Plan for Procedure(s): RIGHT TOTAL KNEE ARTHROPLASTY  The risks benefits and alternatives were discussed with the patient including but not limited to the risks of nonoperative treatment, versus surgical intervention including infection, bleeding, nerve injury,  blood clots, cardiopulmonary complications, morbidity, mortality, among others, and they were willing to proceed.   Glee Arvin, MD 04/13/2023 6:44 AM

## 2023-04-13 NOTE — Addendum Note (Signed)
Addendum  created 04/13/23 1437 by Eulah Pont, CRNA   Flowsheet accepted, Intraprocedure Event edited

## 2023-04-13 NOTE — Evaluation (Signed)
Physical Therapy Evaluation Patient Details Name: [name] [name] MRN: [id number] DOB: 1955/08/22 Today's Date: 04/13/2023  History of Present Illness  Pt is a 66 y/o female admitted 12/2 for R knee OA s/p elective R TKA. PMHx:  Breast CA, DM, HTN, chemo/Rdx, L TKA  Clinical Impression  Pt admitted with/for R TKA.  Pt doing well, but not yet baseline at CG for gait and S to mod I for bed mobility and transfers.  Pt currently limited functionally due to the problems listed below.  (see problems list.)  Pt will benefit from PT to maximize function and safety to be able to get home safely with available assist            If plan is discharge home, recommend the following: A little help with walking and/or transfers;Two people to help with walking and/or transfers;Assistance with cooking/housework;Assist for transportation   Can travel by private vehicle        Equipment Recommendations Other (comment) (Is thinking about a tub bench)  Recommendations for Other Services       Functional Status Assessment Patient has had a recent decline in their functional status and demonstrates the ability to make significant improvements in function in a reasonable and predictable amount of time.     Precautions / Restrictions Precautions Precautions: Knee;Fall Restrictions Weight Bearing Restrictions: Yes RLE Weight Bearing: Weight bearing as tolerated      Mobility  Bed Mobility Overal bed mobility: Modified Independent                  Transfers Overall transfer level: Needs assistance   Transfers: Sit to/from Stand, Bed to chair/wheelchair/BSC Sit to Stand: Contact guard assist           General transfer comment: cues for safest technique,   no assist    Ambulation/Gait Ambulation/Gait assistance: Contact guard assist Gait Distance (Feet): 260 Feet Assistive device: Rolling walker (2 wheels) Gait Pattern/deviations: Step-to pattern, Step-through pattern   Gait  velocity interpretation: 1.31 - 2.62 ft/sec, indicative of limited community ambulator   General Gait Details: progressed through step to to a step through pattern, mild circumduction R swing through.  Generally stable.  Stairs            Wheelchair Mobility     Tilt Bed    Modified Rankin (Stroke Patients Only)       Balance Overall balance assessment: Needs assistance Sitting-balance support: Feet supported, No upper extremity supported Sitting balance-Leahy Scale: Good     Standing balance support: During functional activity, No upper extremity supported, Bilateral upper extremity supported Standing balance-Leahy Scale: Fair                               Pertinent Vitals/Pain Pain Assessment Pain Assessment: 0-10 Pain Score: 6  Pain Location: knee Pain Descriptors / Indicators: Discomfort, Burning Pain Intervention(s): Monitored during session    Home Living Family/patient expects to be discharged to:: Private residence Living Arrangements: Children Available Help at Discharge: Family;Available 24 hours/day;Available PRN/intermittently Type of Home: House Home Access: Stairs to enter Entrance Stairs-Rails: None Entrance Stairs-Number of Steps: 1   Home Layout: One level Home Equipment: Teacher, English as a foreign language (2 wheels)      Prior Function Prior Level of Function : Independent/Modified Independent;Driving                     Extremity/Trunk Assessment   Upper Extremity Assessment Upper Extremity  Assessment: Overall WFL for tasks assessed    Lower Extremity Assessment Lower Extremity Assessment: Overall WFL for tasks assessed;RLE deficits/detail RLE Deficits / Details: sore, but functionals    Cervical / Trunk Assessment Cervical / Trunk Assessment: Normal  Communication   Communication Communication: No apparent difficulties  Cognition Arousal: Alert Behavior During Therapy: WFL for tasks assessed/performed Overall  Cognitive Status: Within Functional Limits for tasks assessed                                          General Comments General comments (skin integrity, edema, etc.): VSS, R knee flexion 115 degrees flex/0 degrees ext.  Went through the TKA exercises, hard capy given.    Exercises Total Joint Exercises Ankle Circles/Pumps: AROM, 10 reps Quad Sets: Supine Short Arc Quad:  (did 1 rep) Heel Slides: AROM, Right, 10 reps, Supine Hip ABduction/ADduction:  (1 rep) Straight Leg Raises: AROM, Right, 10 reps Goniometric ROM: 115 degrees   Assessment/Plan    PT Assessment Patient needs continued PT services  PT Problem List Decreased strength;Decreased range of motion;Decreased activity tolerance;Decreased balance;Decreased mobility;Decreased knowledge of use of DME;Decreased knowledge of precautions       PT Treatment Interventions Gait training;Stair training;DME instruction;Functional mobility training;Therapeutic activities;Wheelchair mobility training    PT Goals (Current goals can be found in the Care Plan section)  Acute Rehab PT Goals Patient Stated Goal: Independent soon PT Goal Formulation: With patient Time For Goal Achievement: 04/27/23 Potential to Achieve Goals: Good    Frequency 7X/week     Co-evaluation               AM-PAC PT "6 Clicks" Mobility  Outcome Measure Help needed turning from your back to your side while in a flat bed without using bedrails?: None Help needed moving from lying on your back to sitting on the side of a flat bed without using bedrails?: None Help needed moving to and from a bed to a chair (including a wheelchair)?: A Little Help needed standing up from a chair using your arms (e.g., wheelchair or bedside chair)?: A Little Help needed to walk in hospital room?: A Little Help needed climbing 3-5 steps with a railing? : A Little 6 Click Score: 20    End of Session   Activity Tolerance: Patient tolerated treatment  well Patient left: in bed;with call bell/phone within reach Nurse Communication: Mobility status PT Visit Diagnosis: Other abnormalities of gait and mobility (R26.89);Pain Pain - Right/Left: Right Pain - part of body: Knee    Time: 1640-1705 PT Time Calculation (min) (ACUTE ONLY): 25 min   Charges:   PT Evaluation $PT Eval Low Complexity: 1 Low PT Treatments $Gait Training: 8-22 mins PT General Charges $$ ACUTE PT VISIT: 1 Visit         04/13/2023  Jacinto Halim., PT Acute Rehabilitation Services [phone]  (office)  Eliseo Gum Ejay Lashley 04/13/2023, 5:22 PM

## 2023-04-14 ENCOUNTER — Encounter (HOSPITAL_COMMUNITY): Payer: Self-pay | Admitting: Orthopaedic Surgery

## 2023-04-14 DIAGNOSIS — M1711 Unilateral primary osteoarthritis, right knee: Secondary | ICD-10-CM | POA: Diagnosis not present

## 2023-04-14 LAB — CBC
HCT: 30.4 % — ABNORMAL LOW (ref 36.0–46.0)
Hemoglobin: 9.6 g/dL — ABNORMAL LOW (ref 12.0–15.0)
MCH: 26.4 pg (ref 26.0–34.0)
MCHC: 31.6 g/dL (ref 30.0–36.0)
MCV: 83.5 fL (ref 80.0–100.0)
Platelets: 304 10*3/uL (ref 150–400)
RBC: 3.64 MIL/uL — ABNORMAL LOW (ref 3.87–5.11)
RDW: 15.8 % — ABNORMAL HIGH (ref 11.5–15.5)
WBC: 8.2 10*3/uL (ref 4.0–10.5)
nRBC: 0 % (ref 0.0–0.2)

## 2023-04-14 LAB — GLUCOSE, CAPILLARY: Glucose-Capillary: 119 mg/dL — ABNORMAL HIGH (ref 70–99)

## 2023-04-14 MED ORDER — CALCIUM CARBONATE ANTACID 500 MG PO CHEW
200.0000 mg | CHEWABLE_TABLET | Freq: Two times a day (BID) | ORAL | Status: DC | PRN
  Administered 2023-04-14: 200 mg via ORAL
  Filled 2023-04-14: qty 1

## 2023-04-14 NOTE — Evaluation (Signed)
Occupational Therapy Evaluation Patient Details Name: [name] [name] MRN: [id number] DOB: 1955-12-09 Today's Date: 04/14/2023   History of Present Illness Pt is a 67 y/o female admitted 12/2 for R knee OA s/p elective R TKA. PMHx:  Breast CA, DM, HTN, chemo/Rdx, L TKA   Clinical Impression   Patient admitted for the procedure above.  PTA she lives at home with family and needed no assist for ADL, iADL or mobility.  She has ongoing discomfort to R knee, but needs no assist for ADL completion and in room mobility.  No further OT needs exist in the acute setting.  No post acute OT recommended.         If plan is discharge home, recommend the following: Assist for transportation    Functional Status Assessment  Patient has had a recent decline in their functional status and demonstrates the ability to make significant improvements in function in a reasonable and predictable amount of time.  Equipment Recommendations  None recommended by OT    Recommendations for Other Services       Precautions / Restrictions Precautions Precautions: Knee Precaution Booklet Issued: No Restrictions Weight Bearing Restrictions: Yes RLE Weight Bearing: Weight bearing as tolerated      Mobility Bed Mobility Overal bed mobility: Modified Independent                  Transfers Overall transfer level: Modified independent                        Balance Overall balance assessment: Mild deficits observed, not formally tested                                         ADL either performed or assessed with clinical judgement   ADL Overall ADL's : Modified independent                                             Vision Patient Visual Report: No change from baseline       Perception Perception: Not tested       Praxis Praxis: Not tested       Pertinent Vitals/Pain Pain Assessment Pain Assessment: Faces Faces Pain Scale: Hurts a little  bit Pain Location: knee Pain Descriptors / Indicators: Sore Pain Intervention(s): Monitored during session     Extremity/Trunk Assessment Upper Extremity Assessment Upper Extremity Assessment: Overall WFL for tasks assessed   Lower Extremity Assessment Lower Extremity Assessment: Defer to PT evaluation   Cervical / Trunk Assessment Cervical / Trunk Assessment: Normal   Communication Communication Communication: No apparent difficulties   Cognition Arousal: Alert Behavior During Therapy: WFL for tasks assessed/performed Overall Cognitive Status: Within Functional Limits for tasks assessed                                       General Comments   VSS on RA    Exercises     Shoulder Instructions      Home Living Family/patient expects to be discharged to:: Private residence Living Arrangements: Children Available Help at Discharge: Family;Available 24 hours/day;Available PRN/intermittently Type of Home: House Home Access: Stairs to enter [employer] of  Steps: 1 Entrance Stairs-Rails: None Home Layout: One level     Bathroom Shower/Tub: IT trainer: Standard Bathroom Accessibility: Yes How Accessible: Accessible via walker Home Equipment: BSC/3in1;Rolling Walker (2 wheels)          Prior Functioning/Environment Prior Level of Function : Independent/Modified Independent;Driving                        OT Problem List: Pain      OT Treatment/Interventions:      OT Goals(Current goals can be found in the care plan section) Acute Rehab OT Goals Patient Stated Goal: Return home OT Goal Formulation: With patient Time For Goal Achievement: 04/17/23 Potential to Achieve Goals: Good  OT Frequency:      Co-evaluation              AM-PAC OT "6 Clicks" Daily Activity     Outcome Measure Help from another person eating meals?: None Help from another person taking care of personal grooming?:  None Help from another person toileting, which includes using toliet, bedpan, or urinal?: None Help from another person bathing (including washing, rinsing, drying)?: None Help from another person to put on and taking off regular upper body clothing?: None Help from another person to put on and taking off regular lower body clothing?: None 6 Click Score: 24   End of Session Equipment Utilized During Treatment: Rolling walker (2 wheels) Nurse Communication: Mobility status  Activity Tolerance: Patient tolerated treatment well Patient left: in bed;with call bell/phone within reach  OT Visit Diagnosis: Pain Pain - Right/Left: Right Pain - part of body: Knee                Time: 1610-9604 OT Time Calculation (min): 18 min Charges:  OT General Charges $OT Visit: 1 Visit OT Evaluation $OT Eval Moderate Complexity: 1 Mod  04/14/2023  RP, OTR/L  Acute Rehabilitation Services  Office:  [phone]   [name] [name] 04/14/2023, 9:32 AM

## 2023-04-14 NOTE — Progress Notes (Signed)
Subjective: 1 Day Post-Op Procedure(s) (LRB): RIGHT TOTAL KNEE ARTHROPLASTY (Right) Patient reports pain as mild.    Objective: Vital signs in last 24 hours: Temp:  [97.9 F (36.6 C)-99.2 F (37.3 C)] 99.1 F (37.3 C) (12/03 0754) Pulse Rate:  [56-89] 76 (12/03 0754) Resp:  [14-20] 17 (12/03 0754) BP: (121-165)/(72-101) 165/91 (12/03 0754) SpO2:  [99 %-100 %] 100 % (12/03 0754)  Intake/Output from previous day: 12/02 0701 - 12/03 0700 In: 2240 [P.O.:1440; I.V.:600; IV Piggyback:200] Out: 2800 [Urine:2750; Blood:50] Intake/Output this shift: No intake/output data recorded.  No results for input(s): "HGB" in the last 72 hours. No results for input(s): "WBC", "RBC", "HCT", "PLT" in the last 72 hours. No results for input(s): "NA", "K", "CL", "CO2", "BUN", "CREATININE", "GLUCOSE", "CALCIUM" in the last 72 hours. No results for input(s): "LABPT", "INR" in the last 72 hours.  Neurologically intact Neurovascular intact Sensation intact distally Intact pulses distally Dorsiflexion/Plantar flexion intact Incision: dressing C/D/I No cellulitis present Compartment soft   Assessment/Plan: 1 Day Post-Op Procedure(s) (LRB): RIGHT TOTAL KNEE ARTHROPLASTY (Right) Advance diet Up with therapy D/C IV fluids Discharge home with home health As long as hemoglobin stable and is cleared by PT WBAT RLE      Cristie Hem 04/14/2023, 8:28 AM

## 2023-04-14 NOTE — Progress Notes (Signed)
Physical Therapy Treatment Patient Details Name: [name] [name] MRN: [id number] DOB: 05-08-1956 Today's Date: 04/14/2023   History of Present Illness Pt is a 67 y/o female admitted 12/2 for R knee OA s/p elective R TKA. PMHx:  Breast CA, DM, HTN, chemo/Rdx, L TKA    PT Comments  Patient mobilizing well, and with good amount of knee flexion seated and supine though tending to keep knee straight with ambulation.  Stable for d/c and encouraged exercise twice daily and use of ice.  Plans per MD for follow up.      If plan is discharge home, recommend the following: Assistance with cooking/housework;Help with stairs or ramp for entrance;Assist for transportation   Can travel by private vehicle        Equipment Recommendations  None recommended by PT    Recommendations for Other Services       Precautions / Restrictions Precautions Precautions: Knee Precaution Booklet Issued: No Restrictions Weight Bearing Restrictions: Yes RLE Weight Bearing: Weight bearing as tolerated     Mobility  Bed Mobility Overal bed mobility: Modified Independent                  Transfers Overall transfer level: Modified independent   Transfers: Sit to/from Stand Sit to Stand: Supervision           General transfer comment: for set up with RW    Ambulation/Gait Ambulation/Gait assistance: Supervision Gait Distance (Feet): 200 Feet Assistive device: Rolling walker (2 wheels) Gait Pattern/deviations: Step-through pattern, Decreased stride length, Decreased stance time - right       General Gait Details: circumduction on R during swing   Stairs       Number of Stairs:  (discussed technique)     Wheelchair Mobility     Tilt Bed    Modified Rankin (Stroke Patients Only)       Balance Overall balance assessment: Mild deficits observed, not formally tested   Sitting balance-Leahy Scale: Normal (reaching to place towel on the floor for seated knee flexion)      Standing balance support: No upper extremity supported Standing balance-Leahy Scale: Fair                              Cognition Arousal: Alert Behavior During Therapy: WFL for tasks assessed/performed Overall Cognitive Status: Within Functional Limits for tasks assessed                                          Exercises Total Joint Exercises Quad Sets: AROM, Right, 5 reps, Supine Short Arc Quad: AROM, Right, 5 reps, Supine Heel Slides: AROM, Right, Supine, 5 reps Hip ABduction/ADduction: AROM, Right, 5 reps, Standing Straight Leg Raises: AROM, Right, 15 reps, Supine Long Arc Quad: AROM, Right, 5 reps, Seated Knee Flexion: AROM, Seated, Right, 5 reps    General Comments General comments (skin integrity, edema, etc.): Discussed car transfers, importance of exercises and pt relates will have CPM at home.      Pertinent Vitals/Pain Pain Assessment Pain Score: 7  Pain Location: knee Pain Descriptors / Indicators: Aching Pain Intervention(s): Monitored during session, Repositioned, Ice applied    Home Living Family/patient expects to be discharged to:: Private residence Living Arrangements: Children Available Help at Discharge: Family;Available 24 hours/day;Available PRN/intermittently Type of Home: House Home Access: Stairs to enter Entrance Stairs-Rails: None  Entrance Stairs-Number of Steps: 1   Home Layout: One level Home Equipment: BSC/3in1;Rolling Walker (2 wheels)      Prior Function            PT Goals (current goals can now be found in the care plan section) Progress towards PT goals: Progressing toward goals    Frequency    7X/week      PT Plan      Co-evaluation              AM-PAC PT "6 Clicks" Mobility   Outcome Measure  Help needed turning from your back to your side while in a flat bed without using bedrails?: None Help needed moving from lying on your back to sitting on the side of a flat bed without  using bedrails?: None Help needed moving to and from a bed to a chair (including a wheelchair)?: None Help needed standing up from a chair using your arms (e.g., wheelchair or bedside chair)?: None Help needed to walk in hospital room?: A Little Help needed climbing 3-5 steps with a railing? : A Little 6 Click Score: 22    End of Session   Activity Tolerance: Patient tolerated treatment well Patient left: in bed;with call bell/phone within reach   PT Visit Diagnosis: Other abnormalities of gait and mobility (R26.89);Pain Pain - Right/Left: Right Pain - part of body: Knee     Time: 0945-1000 PT Time Calculation (min) (ACUTE ONLY): 15 min  Charges:    $Gait Training: 8-22 mins PT General Charges $$ ACUTE PT VISIT: 1 Visit                     Sheran Lawless, PT Acute Rehabilitation Services [phone] 04/14/2023    [name] [name] 04/14/2023, 10:49 AM

## 2023-04-14 NOTE — Plan of Care (Signed)
Pt doing well. Pt given D/C instructions with verbal understanding. Rx's were sent to the pharmacy by MD. Pt's incision is clean and dry with no sign of infection. Pt's IV was removed prior to D/C. Pt D/C'd home via wheelchair per MD order. Pt is stable @ D/C and has no other needs at this time. Nihaal Friesen, RN  

## 2023-04-14 NOTE — Discharge Summary (Signed)
Patient ID: Miabelle Lieberman MRN: [id number] DOB/AGE: Jan 05, 1956 67 y.o.  Admit date: 04/13/2023 Discharge date: 04/14/2023  Admission Diagnoses:  Principal Problem:   Primary osteoarthritis of right knee Active Problems:   Status post total right knee replacement   Discharge Diagnoses:  Same  Past Medical History:  Diagnosis Date   Arthritis    Breast cancer (HCC)    right   Diabetes mellitus without complication (HCC)    GERD (gastroesophageal reflux disease)    Headache    sometimes   History of MRI    Knee, Hip, and Back.   Hypertension    Personal history of chemotherapy    Personal history of radiation therapy     Surgeries: Procedure(s): RIGHT TOTAL KNEE ARTHROPLASTY on 04/13/2023   Consultants:   Discharged Condition: Improved  Hospital Course: Ahlia Scullin is an 67 y.o. female who was admitted 04/13/2023 for operative treatment ofPrimary osteoarthritis of right knee. Patient has severe unremitting pain that affects sleep, daily activities, and work/hobbies. After pre-op clearance the patient was taken to the operating room on 04/13/2023 and underwent  Procedure(s): RIGHT TOTAL KNEE ARTHROPLASTY.    Patient was given perioperative antibiotics:  Anti-infectives (From admission, onward)    Start     Dose/Rate Route Frequency Ordered Stop   04/13/23 1700  ceFAZolin (ANCEF) IVPB 2g/100 mL premix        2 g 200 mL/hr over 30 Minutes Intravenous Every 6 hours 04/13/23 1210 04/13/23 2246   04/13/23 1017  vancomycin (VANCOCIN) powder  Status:  Discontinued          As needed 04/13/23 1017 04/13/23 1115   04/13/23 0615  ceFAZolin (ANCEF) IVPB 2g/100 mL premix        2 g 200 mL/hr over 30 Minutes Intravenous On call to O.R. 04/13/23 4742 04/13/23 0937   04/13/23 0000  doxycycline (VIBRAMYCIN) 100 MG capsule        100 mg Oral 2 times daily 04/13/23 1642          Patient was given sequential compression devices, early ambulation, and chemoprophylaxis to prevent  DVT.  Patient benefited maximally from hospital stay and there were no complications.    Recent vital signs: Patient Vitals for the past 24 hrs:  BP Temp Temp src Pulse Resp SpO2  04/14/23 0754 (!) 165/91 99.1 F (37.3 C) Oral 76 17 100 %  04/14/23 0512 139/87 99.1 F (37.3 C) Oral 72 20 100 %  04/13/23 2336 123/83 98.3 F (36.8 C) Oral 87 20 99 %  04/13/23 1935 (!) 140/82 98.3 F (36.8 C) Oral 76 20 100 %  04/13/23 1729 134/80 98.5 F (36.9 C) Oral 89 20 99 %  04/13/23 1236 (!) 121/91 97.9 F (36.6 C) Oral 72 20 100 %  04/13/23 1215 (!) 145/92 99.2 F (37.3 C) -- 61 17 99 %  04/13/23 1200 133/72 -- -- 80 14 100 %  04/13/23 1145 (!) 143/86 -- -- (!) 56 14 100 %  04/13/23 1130 (!) 127/101 -- -- 72 20 100 %  04/13/23 1117 129/75 99.2 F (37.3 C) -- 75 20 100 %     Recent laboratory studies: No results for input(s): "WBC", "HGB", "HCT", "PLT", "NA", "K", "CL", "CO2", "BUN", "CREATININE", "GLUCOSE", "INR", "CALCIUM" in the last 72 hours.  Invalid input(s): "PT", "2"   Discharge Medications:   Allergies as of 04/14/2023       Reactions   Sulfa Antibiotics Itching        Medication List  STOP taking these medications    aspirin EC 81 MG tablet   carvedilol 6.25 MG tablet Commonly known as: COREG   diclofenac 75 MG EC tablet Commonly known as: VOLTAREN       TAKE these medications    anastrozole 1 MG tablet Commonly known as: ARIMIDEX Take 1 tablet (1 mg total) by mouth daily.   ascorbic acid 500 MG tablet Commonly known as: VITAMIN C Take 500 mg by mouth daily.   atorvastatin 40 MG tablet Commonly known as: LIPITOR Take 1 tablet (40 mg total) by mouth daily.   calcium-vitamin D 500-5 MG-MCG tablet Commonly known as: OSCAL WITH D Take 1 tablet by mouth daily.   CENTRUM SILVER 50+WOMEN PO Take 1 capsule by mouth daily.   docusate sodium 100 MG capsule Commonly known as: COLACE Take 1 capsule (100 mg total) by mouth 2 (two) times daily.    doxycycline 100 MG capsule Commonly known as: Vibramycin Take 1 capsule (100 mg total) by mouth 2 (two) times daily.   lansoprazole 30 MG capsule Commonly known as: PREVACID Take 1 capsule (30 mg total) by mouth every morning.   Magnesium 100 MG Caps Take 100 mg by mouth daily.   metFORMIN 1000 MG tablet Commonly known as: GLUCOPHAGE Take 1 tablet (1,000 mg total) by mouth 2 (two) times daily with a meal.   methocarbamol 500 MG tablet Commonly known as: ROBAXIN Take 1 tablet (500 mg total) by mouth every 6 (six) hours as needed for muscle spasms.   ondansetron 4 MG tablet Commonly known as: ZOFRAN Take 1 tablet (4 mg total) by mouth every 6 (six) hours as needed for nausea.   oxyCODONE 5 MG immediate release tablet Commonly known as: Oxy IR/ROXICODONE Take 1-2 tablets (5-10 mg total) by mouth 3 (three) times daily as needed for moderate pain (pain score 4-6) (pain score 4-6).   rivaroxaban 10 MG Tabs tablet Commonly known as: XARELTO Take 1 tablet (10 mg total) by mouth daily with breakfast. To be taken after surgery to prevent blood clots   UNABLE TO FIND Take 1 Scoop by mouth daily. Med Name: Ivan Anchors   valsartan 40 MG tablet Commonly known as: DIOVAN Take 1 tablet (40 mg total) by mouth daily.               Durable Medical Equipment  (From admission, onward)           Start     Ordered   04/13/23 1211  DME Walker rolling  Once       Question Answer Comment  Walker: With 5 Inch Wheels   Patient needs a walker to treat with the following condition Status post left partial knee replacement      04/13/23 1210   04/13/23 1211  DME 3 n 1  Once        04/13/23 1210   04/13/23 1211  DME Bedside commode  Once       Question:  Patient needs a bedside commode to treat with the following condition  Answer:  Status post left partial knee replacement   04/13/23 1210            Diagnostic Studies: DG Knee Right Port  Result Date: 04/13/2023 CLINICAL  DATA:  Postop right knee. EXAM: PORTABLE RIGHT KNEE - 1-2 VIEW COMPARISON:  None Available. FINDINGS: Right knee arthroplasty in expected alignment. No periprosthetic lucency or fracture. There has been patellar resurfacing. Recent postsurgical change includes air and edema in  the soft tissues and joint space. IMPRESSION: Right knee arthroplasty without immediate postoperative complication. Electronically Signed   By: Narda Rutherford M.D.   On: 04/13/2023 14:56   ECHOCARDIOGRAM COMPLETE  Result Date: 04/06/2023    ECHOCARDIOGRAM REPORT   Patient Name:   [facility]   Date of Exam: 04/06/2023 Medical Rec #:  [id number]     Height:       69.0 in Accession #:    [id number]    Weight:       225.8 lb Date of Birth:  [birth year]    BSA:          2.175 m Patient Age:    67 years      BP:           157/89 mmHg Patient Gender: F             HR:           74 bpm. Exam Location:  [address] Procedure: 2D Echo, 3D Echo, Cardiac Doppler, Color Doppler and Strain Analysis Indications:    Hypertension [242335]                 Orthostatic hypertension [8295621]  History:        Patient has prior history of Echocardiogram examinations, most                 recent 04/17/2020. Risk Factors:Hypertension and Diabetes.  Sonographer:    Eulah Pont RDCS Referring Phys: [phone] [place] W HARDING  Sonographer Comments: Global longitudinal strain was attempted. IMPRESSIONS  1. Left ventricular ejection fraction, by estimation, is 55 to 60%. The left ventricle has normal function. The left ventricle has no regional wall motion abnormalities. There is mild left ventricular hypertrophy. Left ventricular diastolic parameters were normal. The average left ventricular global longitudinal strain is -18.6 %. The global longitudinal strain is normal.  2. Right ventricular systolic function is normal. The right ventricular size is normal. There is normal pulmonary artery systolic pressure.  3. Left atrial size was mildly dilated.  4. The mitral  valve is abnormal. Trivial mitral valve regurgitation. No evidence of mitral stenosis.  5. The aortic valve is tricuspid. There is mild calcification of the aortic valve. There is mild thickening of the aortic valve. Aortic valve regurgitation is trivial. Aortic valve sclerosis is present, with no evidence of aortic valve stenosis.  6. Aortic dilatation noted. There is mild dilatation of the ascending aorta, measuring 40 mm.  7. The inferior vena cava is normal in size with greater than 50% respiratory variability, suggesting right atrial pressure of 3 mmHg. FINDINGS  Left Ventricle: Left ventricular ejection fraction, by estimation, is 55 to 60%. The left ventricle has normal function. The left ventricle has no regional wall motion abnormalities. The average left ventricular global longitudinal strain is -18.6 %. The global longitudinal strain is normal. The left ventricular internal cavity size was normal in size. There is mild left ventricular hypertrophy. Left ventricular diastolic parameters were normal. Right Ventricle: The right ventricular size is normal. No increase in right ventricular wall thickness. Right ventricular systolic function is normal. There is normal pulmonary artery systolic pressure. The tricuspid regurgitant velocity is 2.60 m/s, and  with an assumed right atrial pressure of 3 mmHg, the estimated right ventricular systolic pressure is 30.0 mmHg. Left Atrium: Left atrial size was mildly dilated. Right Atrium: Right atrial size was normal in size. Pericardium: There is no evidence of pericardial effusion. Mitral Valve: The mitral valve  is abnormal. There is mild thickening of the mitral valve leaflet(s). There is mild calcification of the mitral valve leaflet(s). Trivial mitral valve regurgitation. No evidence of mitral valve stenosis. Tricuspid Valve: The tricuspid valve is normal in structure. Tricuspid valve regurgitation is mild . No evidence of tricuspid stenosis. Aortic Valve: The  aortic valve is tricuspid. There is mild calcification of the aortic valve. There is mild thickening of the aortic valve. Aortic valve regurgitation is trivial. Aortic valve sclerosis is present, with no evidence of aortic valve stenosis. Pulmonic Valve: The pulmonic valve was normal in structure. Pulmonic valve regurgitation is mild. No evidence of pulmonic stenosis. Aorta: Aortic dilatation noted. There is mild dilatation of the ascending aorta, measuring 40 mm. Venous: The inferior vena cava is normal in size with greater than 50% respiratory variability, suggesting right atrial pressure of 3 mmHg. IAS/Shunts: No atrial level shunt detected by color flow Doppler.  LEFT VENTRICLE PLAX 2D LVIDd:         4.17 cm     Diastology LVIDs:         2.69 cm     LV e' medial:    6.92 cm/s LV PW:         1.17 cm     LV E/e' medial:  10.7 LV IVS:        1.15 cm     LV e' lateral:   9.46 cm/s LVOT diam:     1.90 cm     LV E/e' lateral: 7.8 LV SV:         69 LV SV Index:   32          2D Longitudinal Strain LVOT Area:     2.84 cm    2D Strain GLS Avg:     -18.6 %  LV Volumes (MOD) LV vol d, MOD A2C: 71.4 ml 3D Volume EF: LV vol d, MOD A4C: 93.5 ml 3D EF:        55 % LV vol s, MOD A2C: 31.4 ml LV EDV:       185 ml LV vol s, MOD A4C: 41.4 ml LV ESV:       83 ml LV SV MOD A2C:     40.0 ml LV SV:        102 ml LV SV MOD A4C:     93.5 ml LV SV MOD BP:      46.7 ml RIGHT VENTRICLE RV S prime:     15.50 cm/s TAPSE (M-mode): 2.1 cm LEFT ATRIUM             Index        RIGHT ATRIUM           Index LA diam:        3.80 cm 1.75 cm/m   RA Area:     16.60 cm LA Vol (A2C):   39.8 ml 18.30 ml/m  RA Volume:   40.20 ml  18.48 ml/m LA Vol (A4C):   41.7 ml 19.17 ml/m LA Biplane Vol: 41.7 ml 19.17 ml/m  AORTIC VALVE LVOT Vmax:   122.50 cm/s LVOT Vmean:  79.400 cm/s LVOT VTI:    0.242 m  AORTA Ao Root diam: 3.50 cm Ao Asc diam:  4.00 cm MV E velocity: 73.90 cm/s   TRICUSPID VALVE MV A velocity: 113.00 cm/s  TR Peak grad:   27.0 mmHg MV E/A  ratio:  0.65         TR Vmax:  260.00 cm/s                              SHUNTS                             Systemic VTI:  0.24 m                             Systemic Diam: 1.90 cm Charlton Haws MD Electronically signed by Charlton Haws MD Signature Date/Time: 04/06/2023/11:06:44 AM    Final     Disposition: Discharge disposition: 01-Home or Self Care          Follow-up Information     Cristie Hem, PA-C. Schedule an appointment as soon as possible for a visit in 2 week(s).   Specialty: Orthopedic Surgery Contact information: [address] [place] [address] [phone]         [facility] Follow up.   Why: [facility] will contact you for the first home visit. Contact information: [address] [place] [address] [phone]                  Signed: Cristie Hem 04/14/2023, 8:29 AM

## 2023-04-15 ENCOUNTER — Other Ambulatory Visit: Payer: Self-pay | Admitting: Family

## 2023-04-15 ENCOUNTER — Telehealth: Payer: Self-pay

## 2023-04-15 ENCOUNTER — Telehealth: Payer: Self-pay | Admitting: Orthopaedic Surgery

## 2023-04-15 DIAGNOSIS — K219 Gastro-esophageal reflux disease without esophagitis: Secondary | ICD-10-CM

## 2023-04-15 NOTE — Transitions of Care (Post Inpatient/ED Visit) (Signed)
   04/15/2023  Name: [name] [name] MRN: [id number] DOB: 09-17-1955  Today's TOC FU Call Status: Today's TOC FU Call Status:: Successful TOC FU Call Completed TOC FU Call Complete Date: 04/15/23 Patient's Name and Date of Birth confirmed.  Transition Care Management Follow-up Telephone Call Date of Discharge: 04/14/23 Discharge Facility: [facility]) Type of Discharge: Inpatient Admission Primary Inpatient Discharge Diagnosis:: s/p right TKA How have you been since you were released from the hospital?: Better (She reports that she is still in pain but it is getting better.  Her daughter, Elmarie Shiley, also participated in the call) Any questions or concerns?: No  Items Reviewed: Did you receive and understand the discharge instructions provided?: Yes Medications obtained,verified, and reconciled?: Yes (Medications Reviewed) (She has all medications but the doxycycline. I instructed them to contact the pharmacy and check on the status of the order for the doxycycline.) Any new allergies since your discharge?: No Dietary orders reviewed?: Yes Type of Diet Ordered:: heart healthy, low sodium, diabetic Do you have support at home?: Yes People in Home: child(ren), adult Name of Support/Comfort Primary Source: her daughter  Medications Reviewed Today: Medications Reviewed Today   Medications were not reviewed in this encounter     Home Care and Equipment/Supplies: Were Home Health Services Ordered?: Yes Name of Home Health Agency:: [facility] Has Agency set up a time to come to your home?: Yes First Home Health Visit Date: 04/15/23 Any new equipment or medical supplies ordered?: No (She said she has a CPM as well as a BSC)  Functional Questionnaire: Do you need assistance with bathing/showering or dressing?: Yes (her daughter can assist as needed) Do you need assistance with meal preparation?: Yes (her daughter prepares meals) Do you need assistance with eating?: No Do you have  difficulty maintaining continence: No (uses BSC) Do you need assistance with getting out of bed/getting out of a chair/moving?: Yes (ambulates with RW , WBAT RLE) Do you have difficulty managing or taking your medications?: Yes (Her daughter assists with medication management)  Follow up appointments reviewed: PCP Follow-up appointment confirmed?: Yes Date of PCP follow-up appointment?: 05/01/23 Follow-up Provider: Ricky Stabs, NP Specialist Hospital Follow-up appointment confirmed?: Yes Date of Specialist follow-up appointment?: 04/28/23 Follow-Up Specialty Provider:: orhtopedic surgery;  04/29/2023 - oncology.  She is going to cancel the podiatry appointment scheduled for 04/20/2023. Do you need transportation to your follow-up appointment?: No Do you understand care options if your condition(s) worsen?: Yes-patient verbalized understanding    SIGNATURE Robyne Peers, RN

## 2023-04-15 NOTE — Telephone Encounter (Signed)
Complete

## 2023-04-15 NOTE — Telephone Encounter (Signed)
Received call from [facility]) with [facility] needing verbal orders for HHPT   2 Wk 1, 3 Wk 1 and 1 Wk 1. Charri also said patient did not receive Mupirocin ointment for her nose.  The number to contact Emiliano Dyer is   [phone]

## 2023-04-15 NOTE — Telephone Encounter (Signed)
That was to be done pre-op.  Ok for verbal orders

## 2023-04-16 NOTE — Telephone Encounter (Signed)
Called and gave verbal. Mardella Layman said Mupirocin was to be done before surgery, and no reason to do now.

## 2023-04-17 ENCOUNTER — Telehealth: Payer: Self-pay | Admitting: Orthopaedic Surgery

## 2023-04-17 ENCOUNTER — Ambulatory Visit: Payer: No Typology Code available for payment source | Admitting: Hematology and Oncology

## 2023-04-17 NOTE — Telephone Encounter (Signed)
Received call from [facility] with [facility] asked if patient can take Tylenol?  The number to contact Vinnie Langton is [phone]

## 2023-04-20 ENCOUNTER — Ambulatory Visit: Payer: Medicaid Other | Admitting: Podiatry

## 2023-04-20 ENCOUNTER — Telehealth: Payer: Self-pay | Admitting: Orthopaedic Surgery

## 2023-04-20 ENCOUNTER — Other Ambulatory Visit: Payer: Self-pay | Admitting: Physician Assistant

## 2023-04-20 MED ORDER — OXYCODONE HCL 5 MG PO TABS: 5.0000 mg | ORAL_TABLET | Freq: Three times a day (TID) | ORAL | 0 refills | Status: DC | PRN

## 2023-04-20 NOTE — Telephone Encounter (Signed)
Patient called and needs a refill on Oxycodone. CB#[phone]

## 2023-04-20 NOTE — Telephone Encounter (Signed)
sent 

## 2023-04-20 NOTE — Telephone Encounter (Signed)
Yes, do not exceed 3600 mg/day

## 2023-04-20 NOTE — Telephone Encounter (Signed)
Called and left verbal on voicemail.

## 2023-04-22 ENCOUNTER — Ambulatory Visit: Payer: No Typology Code available for payment source | Admitting: Hematology and Oncology

## 2023-04-24 ENCOUNTER — Ambulatory Visit: Payer: No Typology Code available for payment source | Admitting: Adult Health

## 2023-04-28 ENCOUNTER — Ambulatory Visit (INDEPENDENT_AMBULATORY_CARE_PROVIDER_SITE_OTHER): Payer: Medicaid Other | Admitting: Physician Assistant

## 2023-04-28 DIAGNOSIS — Z96651 Presence of right artificial knee joint: Secondary | ICD-10-CM

## 2023-04-28 MED ORDER — OXYCODONE HCL 5 MG PO TABS: 5.0000 mg | ORAL_TABLET | Freq: Three times a day (TID) | ORAL | 0 refills | Status: DC | PRN

## 2023-04-28 NOTE — Progress Notes (Signed)
Post-Op Visit Note   [name]           Date of Birth: 1955-10-11           MRN: [id number] Visit Date: 04/28/2023 PCP: Rema Fendt, NP   Assessment & Plan:  Chief Complaint:  Chief Complaint  Patient presents with   Right Knee - Follow-up    Right total knee arthroplasty 04/13/2023   Visit Diagnoses:  1. Status post total right knee replacement     Plan: Patient is a pleasant 67 year old female who comes in today 2 weeks status post right total knee replacement 04-13-23.  She has been doing okay.  She has been in a fair amount of pain and is taking oxycodone and Robaxin.  She has been compliant taking her Xarelto for DVT prophylaxis.  She has been getting home health physical therapy and is ambulating with a walker.  Examination of the right knee reveals a well healing surgical incision with nylon sutures in place.  No evidence of infection or cellulitis.  She does have moderate swelling of the right lower extremity.  Calf is soft and minimal tenderness.  She is neurovascularly intact distally.  Today, sutures were removed and Steri-Strips applied.  I have put in a referral for outpatient physical therapy.  I refilled her oxycodone.  She will continue taking the Xarelto for DVT prophylaxis.  Follow-up in 4 weeks for repeat evaluation and 2 view x-rays of the right knee.  Call with concerns or questions.  Follow-Up Instructions: Return in about 4 weeks (around 05/26/2023).   Orders:  No orders of the defined types were placed in this encounter.  No orders of the defined types were placed in this encounter.   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   Hypertension    Personal history of chemotherapy    Personal history of radiation therapy     Family History  Problem Relation Age of Onset   Asthma Mother    Arthritis Sister    Asthma Brother    Arthritis Brother     Past Surgical History:  Procedure Laterality Date   BREAST BIOPSY Right 02/03/2020   x2   BREAST BIOPSY Right 02/06/2020   x2   BREAST LUMPECTOMY Right 07/26/2020   BREAST LUMPECTOMY WITH RADIOACTIVE SEED AND SENTINEL LYMPH NODE BIOPSY Right 07/26/2020   Procedure: RIGHT BREAST LUMPECTOMY WITH RADIOACTIVE SEED X 2 AND SENTINEL LYMPH NODE BIOPSY;  Surgeon: Harriette Bouillon, MD;  Location: [facility];  Service: General;  Laterality: Right;   IR IMAGING GUIDED PORT INSERTION  02/23/2020   PORT-A-CATH REMOVAL N/A 07/26/2020   Procedure: REMOVAL PORT-A-CATH;  Surgeon: Harriette Bouillon, MD;  Location: [facility];  Service: General;  Laterality: N/A;   TOTAL KNEE ARTHROPLASTY Left 11/18/2021   Procedure: LEFT TOTAL KNEE ARTHROPLASTY;  Surgeon: [name], MD;  Location: [facility];  Service: Orthopedics;  Laterality: Left;   TOTAL KNEE ARTHROPLASTY Right 04/13/2023   Procedure: RIGHT TOTAL KNEE ARTHROPLASTY;  Surgeon: [name], MD;  Location: [facility] OR;  Service: Orthopedics;  Laterality: Right;   Social  History   Occupational History   Not on file  Tobacco Use   Smoking status: Never   Smokeless tobacco: Never  Vaping Use   Vaping status: Never Used  Substance and Sexual Activity   Alcohol use: Yes    Alcohol/week: 1.0 standard drink of alcohol    Types: 1 Glasses of wine per week   Drug use: Never   Sexual activity: Not Currently

## 2023-04-29 ENCOUNTER — Other Ambulatory Visit: Payer: Self-pay

## 2023-04-29 ENCOUNTER — Encounter: Payer: Self-pay | Admitting: Orthopaedic Surgery

## 2023-04-29 ENCOUNTER — Inpatient Hospital Stay: Payer: Medicaid Other | Attending: Hematology and Oncology | Admitting: Hematology and Oncology

## 2023-04-29 DIAGNOSIS — C50411 Malignant neoplasm of upper-outer quadrant of right female breast: Secondary | ICD-10-CM

## 2023-04-29 DIAGNOSIS — Z96651 Presence of right artificial knee joint: Secondary | ICD-10-CM

## 2023-04-29 DIAGNOSIS — Z170 Estrogen receptor positive status [ER+]: Secondary | ICD-10-CM

## 2023-04-29 NOTE — Progress Notes (Signed)
[facility]  Telephone:(336) [phone] Fax:(336) [phone]     ID: Ophelia Quilty DOB: 17-Feb-1956  MR#: [id number]  JYN#:[id number]  Patient Care Team: Rema Fendt, NP as PCP - General (Nurse Practitioner) Marykay Lex, MD as PCP - Cardiology (Cardiology) Pershing Proud, RN as Oncology Nurse Navigator Donnelly Angelica, RN as Oncology Nurse Navigator Harriette Bouillon, MD as Consulting Physician (General Surgery) Lonie Peak, MD as Attending Physician (Radiation Oncology) Rachel Moulds, MD as Medical Oncologist (Hematology and Oncology) Anselm Lis, RPH-CPP as Pharmacist (Hematology and Oncology) Rachel Moulds, MD  CHIEF COMPLAINT: Estrogen receptor positive breast cancer  CURRENT TREATMENT: Anastrozole   INTERVAL HISTORY:  Tatym returns today for telephone follow up of her estrogen receptor positive breast cancer. She is on anastrozole alone for adjuvant anti estrogen therapy.  Since her last visit, she says she has completed all her dental work.  She is however awaiting dental clearance, she has a follow-up appointment today as well. Other than that she had to go home last month because her brother passed away.  She is doing well overall and would like to come back to see [place] in spring.  COVID 19 VACCINATION STATUS: s/p  [employer]  X2, most recently June 2021, no booster as of 05/01/2020   HISTORY OF CURRENT ILLNESS: From the original intake note:  Lucretia Chatten herself palpated a mass in the right axilla "a long time ago", more recently with complaints of associated pain. She brought it to medical attention and underwent bilateral diagnostic mammography with tomography and bilateral breast ultrasonography at [facility] on 01/31/2020 showing: breast density category B; three right breast masses-- 1.9 cm at 12 o'clock, 2.3 cm at 11 o'clock, 0.4 cm at 1 o'clock; two adjacent enlarged thickened lymph nodes measuring up to 2.1 cm; no evidence of left  breast malignancy.  Accordingly on 02/03/2020 she proceeded to biopsy of the right breast area at 12 o'clock and right axilla. The pathology from this procedure ([id number]) showed: invasive ductal carcinoma, grade 3. Prognostic indicators significant for: estrogen receptor, 100% positive with strong staining intensity and progesterone receptor, 0% negative. Proliferation marker Ki67 at 60%. HER2 equivocal by immunohistochemistry (2+), but negative by fluorescent in situ hybridization with a signals ratio 0.81 and number per cell 2.7.  The biopsied lymph node was found to show metastatic carcinoma. The morphology was considered different from the biopsied mass, and a prognostic panel was performed. Estrogen receptor, 100% positive with strong staining intensity and progesterone receptor, 0% negative. Proliferation marker Ki67 at 70%. HER2 equivcoal by immunohistochemistry (2+), but negative) by fluorescent in situ hybridization with a signals ratio 1.16 and number per cell 3.9.  She underwent additional right breast biopsies on 02/06/2020. Pathology [id number] [id number]) from the mass at 11 o'clock showed: invasive ductal carcinoma, grade 2. Prognostic indicators significant for: estrogen receptor, 100% positive with strong staining intensity and progesterone receptor, 0% negative. Proliferation marker Ki67 at 15%. HER2 equivocal by immunohistochemistry (2+), but negative by fluorescent in situ hybridization with a signals ratio 1.29 and number per cell 2.7.  The biopsied mass at 1 o'clock showed only fibrocystic change.  The patient's subsequent history is as detailed below.   PAST MEDICAL HISTORY: Past Medical History:  Diagnosis Date   Arthritis    Breast cancer (HCC)    right   Diabetes mellitus without complication (HCC)    GERD (gastroesophageal reflux disease)    Headache    sometimes   History of MRI  Knee, Hip, and Back.   Hypertension    Personal history of chemotherapy    Personal  history of radiation therapy     PAST SURGICAL HISTORY: Past Surgical History:  Procedure Laterality Date   BREAST BIOPSY Right 02/03/2020   x2   BREAST BIOPSY Right 02/06/2020   x2   BREAST LUMPECTOMY Right 07/26/2020   BREAST LUMPECTOMY WITH RADIOACTIVE SEED AND SENTINEL LYMPH NODE BIOPSY Right 07/26/2020   Procedure: RIGHT BREAST LUMPECTOMY WITH RADIOACTIVE SEED X 2 AND SENTINEL LYMPH NODE BIOPSY;  Surgeon: Harriette Bouillon, MD;  Location: [facility];  Service: General;  Laterality: Right;   IR IMAGING GUIDED PORT INSERTION  02/23/2020   PORT-A-CATH REMOVAL N/A 07/26/2020   Procedure: REMOVAL PORT-A-CATH;  Surgeon: Harriette Bouillon, MD;  Location: [facility];  Service: General;  Laterality: N/A;   TOTAL KNEE ARTHROPLASTY Left 11/18/2021   Procedure: LEFT TOTAL KNEE ARTHROPLASTY;  Surgeon: [name], MD;  Location: [facility];  Service: Orthopedics;  Laterality: Left;   TOTAL KNEE ARTHROPLASTY Right 04/13/2023   Procedure: RIGHT TOTAL KNEE ARTHROPLASTY;  Surgeon: [name], MD;  Location: [facility] OR;  Service: Orthopedics;  Laterality: Right;    FAMILY HISTORY: Family History  Problem Relation Age of Onset   Asthma Mother    Arthritis Sister    Asthma Brother    Arthritis Brother   [name] died in his 78s from causes unknown to the patient.  [name] died from heart disease at age 65.  The patient had 4 brothers, 1 sister, with no history of cancer in the family to her knowledge   GYNECOLOGIC HISTORY:  No LMP recorded. Patient is postmenopausal. Menarche: 67 years old Age at first live birth: 67 years old GX P 1 LMP 65s Contraceptive HRT no  Hysterectomy? no BSO? no   SOCIAL HISTORY: (updated 02/2020)  Jacolyn is originally from [place].  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  She describes herself as single.  At home she lives with her daughter [name] who works for [employer], her daughters' husband  [name] who is a Insurance underwriter, and their children who are 85 months old and 57 years old.  The patient is [place]    ADVANCED DIRECTIVES: Not in place.  The patient tells me she would intend to name her daughter Elmarie Shiley as her healthcare power of attorney   HEALTH MAINTENANCE: Social History   Tobacco Use   Smoking status: Never   Smokeless tobacco: Never  Vaping Use   Vaping status: Never Used  Substance Use Topics   Alcohol use: Yes    Alcohol/week: 1.0 standard drink of alcohol    Types: 1 Glasses of wine per week   Drug use: Never     Colonoscopy: never done  PAP: 2017 (performed in [place])  Bone density:    Allergies  Allergen Reactions   Sulfa Antibiotics Itching    Current Outpatient Medications  Medication Sig Dispense Refill   anastrozole (ARIMIDEX) 1 MG tablet Take 1 tablet (1 mg total) by mouth daily. 90 tablet 4   atorvastatin (LIPITOR) 40 MG tablet Take 1 tablet (40 mg total) by mouth daily. 90 tablet 1   calcium-vitamin D (OSCAL WITH D) 500-5 MG-MCG tablet Take 1 tablet by mouth daily.     docusate sodium (COLACE) 100 MG capsule Take 1 capsule (100 mg total) by mouth 2 (two) times daily. 10 capsule 0  doxycycline (VIBRAMYCIN) 100 MG capsule Take 1 capsule (100 mg total) by mouth 2 (two) times daily. 20 capsule 0   lansoprazole (PREVACID) 30 MG capsule Take 1 capsule by mouth in the morning 90 capsule 0   Magnesium 100 MG CAPS Take 100 mg by mouth daily.     metFORMIN (GLUCOPHAGE) 1000 MG tablet Take 1 tablet (1,000 mg total) by mouth 2 (two) times daily with a meal. 180 tablet 0   methocarbamol (ROBAXIN) 500 MG tablet Take 1 tablet (500 mg total) by mouth every 6 (six) hours as needed for muscle spasms. 20 tablet 2   Multiple Vitamins-Minerals (CENTRUM SILVER 50+WOMEN PO) Take 1 capsule by mouth daily.     ondansetron (ZOFRAN) 4 MG tablet Take 1 tablet (4 mg total) by mouth every 6 (six) hours as needed for nausea. 20 tablet 0   oxyCODONE (OXY  IR/ROXICODONE) 5 MG immediate release tablet Take 1-2 tablets (5-10 mg total) by mouth 3 (three) times daily as needed for moderate pain (pain score 4-6) (pain score 4-6). 36 tablet 0   rivaroxaban (XARELTO) 10 MG TABS tablet Take 1 tablet (10 mg total) by mouth daily with breakfast. To be taken after surgery to prevent blood clots 30 tablet 0   UNABLE TO FIND Take 1 Scoop by mouth daily. Med Name: Ivan Anchors     valsartan (DIOVAN) 40 MG tablet Take 1 tablet (40 mg total) by mouth daily. 90 tablet 3   vitamin C (ASCORBIC ACID) 500 MG tablet Take 500 mg by mouth daily.     No current facility-administered medications for this visit.    OBJECTIVE: African-American woman in no acute distress  There were no vitals filed for this visit.       There is no height or weight on file to calculate BMI.   Wt Readings from Last 3 Encounters:  04/13/23 225 lb (102.1 kg)  04/03/23 225 lb 12.8 oz (102.4 kg)  03/06/23 223 lb (101.2 kg)     ECOG FS:1 - Symptomatic but completely ambulatory  Physical examination not done, telephone visit LAB RESULTS:  CMP     Component Value Date/Time   NA 140 04/03/2023 1430   K 4.0 04/03/2023 1430   CL 107 04/03/2023 1430   CO2 28 04/03/2023 1430   GLUCOSE 109 (H) 04/03/2023 1430   BUN 10 04/03/2023 1430   CREATININE 0.70 04/03/2023 1430   CREATININE 0.79 10/16/2022 1532   CREATININE 0.75 07/21/2022 0820   CALCIUM 8.8 (L) 04/03/2023 1430   PROT 6.5 10/16/2022 1532   ALBUMIN 4.0 10/16/2022 1532   AST 25 10/16/2022 1532   ALT 22 10/16/2022 1532   ALKPHOS 92 10/16/2022 1532   BILITOT 0.3 10/16/2022 1532   GFRNONAA >60 04/03/2023 1430   GFRNONAA >60 10/16/2022 1532    No results found for: "TOTALPROTELP", "ALBUMINELP", "A1GS", "A2GS", "BETS", "BETA2SER", "GAMS", "MSPIKE", "SPEI"  Lab Results  Component Value Date   WBC 8.2 04/14/2023   NEUTROABS 2.0 10/16/2022   HGB 9.6 (L) 04/14/2023   HCT 30.4 (L) 04/14/2023   MCV 83.5 04/14/2023   PLT 304  04/14/2023    No results found for: "LABCA2"  No components found for: "UJWJXB147"  No results for input(s): "INR" in the last 168 hours.  No results found for: "LABCA2"  No results found for: "WGN562"  No results found for: "CAN125"  No results found for: "CAN153"  No results found for: "CA2729"  No components found for: "HGQUANT"  No results found  for: "CEA1", "CEA" / No results found for: "CEA1", "CEA"   No results found for: "AFPTUMOR"  No results found for: "CHROMOGRNA"  No results found for: "KPAFRELGTCHN", "LAMBDASER", "KAPLAMBRATIO" (kappa/lambda light chains)  No results found for: "HGBA", "HGBA2QUANT", "HGBFQUANT", "HGBSQUAN" (Hemoglobinopathy evaluation)   No results found for: "LDH"  No results found for: "IRON", "TIBC", "IRONPCTSAT" (Iron and TIBC)  No results found for: "FERRITIN"  Urinalysis    Component Value Date/Time   COLORURINE YELLOW 04/24/2022 0254   APPEARANCEUR CLEAR 04/24/2022 0254   LABSPEC 1.011 04/24/2022 0254   PHURINE 5.0 04/24/2022 0254   GLUCOSEU NEGATIVE 04/24/2022 0254   HGBUR NEGATIVE 04/24/2022 0254   BILIRUBINUR NEGATIVE 04/24/2022 0254   KETONESUR NEGATIVE 04/24/2022 0254   PROTEINUR NEGATIVE 04/24/2022 0254   NITRITE NEGATIVE 04/24/2022 0254   LEUKOCYTESUR NEGATIVE 04/24/2022 0254     STUDIES: DG Knee Right Port Result Date: 04/13/2023 CLINICAL DATA:  Postop right knee. EXAM: PORTABLE RIGHT KNEE - 1-2 VIEW COMPARISON:  None Available. FINDINGS: Right knee arthroplasty in expected alignment. No periprosthetic lucency or fracture. There has been patellar resurfacing. Recent postsurgical change includes air and edema in the soft tissues and joint space. IMPRESSION: Right knee arthroplasty without immediate postoperative complication. Electronically Signed   By: Narda Rutherford M.D.   On: 04/13/2023 14:56   ECHOCARDIOGRAM COMPLETE Result Date: 04/06/2023    ECHOCARDIOGRAM REPORT   Patient Name:   [facility]   Date of  Exam: 04/06/2023 Medical Rec #:  [id number]     Height:       69.0 in Accession #:    [id number]    Weight:       225.8 lb Date of Birth:  06-14-1955    BSA:          2.175 m Patient Age:    67 years      BP:           157/89 mmHg Patient Gender: F             HR:           74 bpm. Exam Location:  [address] Procedure: 2D Echo, 3D Echo, Cardiac Doppler, Color Doppler and Strain Analysis Indications:    Hypertension [242335]                 Orthostatic hypertension [8295621]  History:        Patient has prior history of Echocardiogram examinations, most                 recent 04/17/2020. Risk Factors:Hypertension and Diabetes.  Sonographer:    Eulah Pont RDCS Referring Phys: [phone] Jurewicz W HARDING  Sonographer Comments: Global longitudinal strain was attempted. IMPRESSIONS  1. Left ventricular ejection fraction, by estimation, is 55 to 60%. The left ventricle has normal function. The left ventricle has no regional wall motion abnormalities. There is mild left ventricular hypertrophy. Left ventricular diastolic parameters were normal. The average left ventricular global longitudinal strain is -18.6 %. The global longitudinal strain is normal.  2. Right ventricular systolic function is normal. The right ventricular size is normal. There is normal pulmonary artery systolic pressure.  3. Left atrial size was mildly dilated.  4. The mitral valve is abnormal. Trivial mitral valve regurgitation. No evidence of mitral stenosis.  5. The aortic valve is tricuspid. There is mild calcification of the aortic valve. There is mild thickening of the aortic valve. Aortic valve regurgitation is trivial. Aortic valve sclerosis is present, with no  evidence of aortic valve stenosis.  6. Aortic dilatation noted. There is mild dilatation of the ascending aorta, measuring 40 mm.  7. The inferior vena cava is normal in size with greater than 50% respiratory variability, suggesting right atrial pressure of 3 mmHg. FINDINGS  Left  Ventricle: Left ventricular ejection fraction, by estimation, is 55 to 60%. The left ventricle has normal function. The left ventricle has no regional wall motion abnormalities. The average left ventricular global longitudinal strain is -18.6 %. The global longitudinal strain is normal. The left ventricular internal cavity size was normal in size. There is mild left ventricular hypertrophy. Left ventricular diastolic parameters were normal. Right Ventricle: The right ventricular size is normal. No increase in right ventricular wall thickness. Right ventricular systolic function is normal. There is normal pulmonary artery systolic pressure. The tricuspid regurgitant velocity is 2.60 m/s, and  with an assumed right atrial pressure of 3 mmHg, the estimated right ventricular systolic pressure is 30.0 mmHg. Left Atrium: Left atrial size was mildly dilated. Right Atrium: Right atrial size was normal in size. Pericardium: There is no evidence of pericardial effusion. Mitral Valve: The mitral valve is abnormal. There is mild thickening of the mitral valve leaflet(s). There is mild calcification of the mitral valve leaflet(s). Trivial mitral valve regurgitation. No evidence of mitral valve stenosis. Tricuspid Valve: The tricuspid valve is normal in structure. Tricuspid valve regurgitation is mild . No evidence of tricuspid stenosis. Aortic Valve: The aortic valve is tricuspid. There is mild calcification of the aortic valve. There is mild thickening of the aortic valve. Aortic valve regurgitation is trivial. Aortic valve sclerosis is present, with no evidence of aortic valve stenosis. Pulmonic Valve: The pulmonic valve was normal in structure. Pulmonic valve regurgitation is mild. No evidence of pulmonic stenosis. Aorta: Aortic dilatation noted. There is mild dilatation of the ascending aorta, measuring 40 mm. Venous: The inferior vena cava is normal in size with greater than 50% respiratory variability, suggesting right  atrial pressure of 3 mmHg. IAS/Shunts: No atrial level shunt detected by color flow Doppler.  LEFT VENTRICLE PLAX 2D LVIDd:         4.17 cm     Diastology LVIDs:         2.69 cm     LV e' medial:    6.92 cm/s LV PW:         1.17 cm     LV E/e' medial:  10.7 LV IVS:        1.15 cm     LV e' lateral:   9.46 cm/s LVOT diam:     1.90 cm     LV E/e' lateral: 7.8 LV SV:         69 LV SV Index:   32          2D Longitudinal Strain LVOT Area:     2.84 cm    2D Strain GLS Avg:     -18.6 %  LV Volumes (MOD) LV vol d, MOD A2C: 71.4 ml 3D Volume EF: LV vol d, MOD A4C: 93.5 ml 3D EF:        55 % LV vol s, MOD A2C: 31.4 ml LV EDV:       185 ml LV vol s, MOD A4C: 41.4 ml LV ESV:       83 ml LV SV MOD A2C:     40.0 ml LV SV:        102 ml LV SV MOD A4C:  93.5 ml LV SV MOD BP:      46.7 ml RIGHT VENTRICLE RV S prime:     15.50 cm/s TAPSE (M-mode): 2.1 cm LEFT ATRIUM             Index        RIGHT ATRIUM           Index LA diam:        3.80 cm 1.75 cm/m   RA Area:     16.60 cm LA Vol (A2C):   39.8 ml 18.30 ml/m  RA Volume:   40.20 ml  18.48 ml/m LA Vol (A4C):   41.7 ml 19.17 ml/m LA Biplane Vol: 41.7 ml 19.17 ml/m  AORTIC VALVE LVOT Vmax:   122.50 cm/s LVOT Vmean:  79.400 cm/s LVOT VTI:    0.242 m  AORTA Ao Root diam: 3.50 cm Ao Asc diam:  4.00 cm MV E velocity: 73.90 cm/s   TRICUSPID VALVE MV A velocity: 113.00 cm/s  TR Peak grad:   27.0 mmHg MV E/A ratio:  0.65         TR Vmax:        260.00 cm/s                              SHUNTS                             Systemic VTI:  0.24 m                             Systemic Diam: 1.90 cm Charlton Haws MD Electronically signed by Charlton Haws MD Signature Date/Time: 04/06/2023/11:06:44 AM    Final      ELIGIBLE FOR AVAILABLE RESEARCH PROTOCOL: AET  ASSESSMENT: 67 y.o. [place] woman status post right breast upper outer quadrant (12:00) and axillary lymph node biopsy 02/03/2020, both positive for a clinical T1c N1, stage IIB invasive ductal carcinoma, grade 3, estrogen  receptor positive, HER-2 and progesterone receptor negative, with an MIB-1 of 60%.  (a) biopsy of a second right upper outer quadrant T2 lesion (11 o'clock) 02/06/2020 also showed invasive ductal carcinoma, but grade 2, estrogen receptor positive, progesterone receptor and HER-2 negative, with an MIB-1 of 15%  (b) biopsy of a third right breast lesion (1:00) was benign   (1) neoadjuvant chemotherapy consisting of cyclophosphamide and docetaxel every 21 days x 4 starting 03/05/2020  (a) discontinued after 2 cycles because of peripheral neuropathy  (b) cyclophosphamide/doxorubicin started 04/24/2020, repeated every 21 days x4, completed 06/25/2020  (c) echo 04/17/2020 showed EF of 55-60%  (2) right lumpectomy 07/26/2020 found a residual pT1c pN1 invasive ductal carcinoma, grade 2, with negative margins  (a) repeat prognostic indicators show the tumor to be estrogen receptor positive, progesterone receptor and HER2 negative, with an MIB-1 of 2%.  (b) a total of 6 right axillary lymph nodes removed, 2 positive  (3) adjuvant radiation completed 10/17/2020  (4) anastrozole started June 2022  (5) CT scan of the chest 04/23/2020 shows a left brachiocephalicl catheter associated clot  (a) rivaroxaban started 04/24/2020, discontinued 08/22/2020  (b) port removed 07/26/2020  (6) Started abemaciclib in March 2023, took it till Dec 2023, stopped it since then.  Last MRI breast Jan 2023 showed no evidence of breast malignancy. Post surgical collection right breast near right nipple which could have been  causing the drainage.  Mammogram in Nov with stable lumpectomy changes, recommendation was to repeat mammogram in 1 year.  Plan This is a follow-up telephone visit, she is doing very well on anastrozole.  She has now completed all her dental work, we are still awaiting dental clearance, she is hoping to have her follow-up appointment with her dentist scheduled today and she is clearly instructed several  times to request them to fax a dental clearance back so we can get her started on the bisphosphonates.  She otherwise would like to return to clinic for follow-up in spring.  She denies any other health symptoms of concern.  Will see her as scheduled  Total time: 8 min  *Total Encounter Time as defined by the Centers for Medicare and Medicaid Services includes, in addition to the face-to-face time of a patient visit (documented in the note above) non-face-to-face time: obtaining and reviewing outside history, ordering and reviewing medications, tests or procedures, care coordination (communications with other health care professionals or caregivers) and documentation in the medical record.

## 2023-05-01 ENCOUNTER — Ambulatory Visit (INDEPENDENT_AMBULATORY_CARE_PROVIDER_SITE_OTHER): Payer: Medicaid Other | Admitting: Family

## 2023-05-01 VITALS — BP 111/71 | HR 97 | Temp 98.3°F | Ht 68.5 in | Wt 217.0 lb

## 2023-05-01 DIAGNOSIS — Z96651 Presence of right artificial knee joint: Secondary | ICD-10-CM | POA: Diagnosis not present

## 2023-05-01 DIAGNOSIS — Z09 Encounter for follow-up examination after completed treatment for conditions other than malignant neoplasm: Secondary | ICD-10-CM

## 2023-05-01 DIAGNOSIS — R059 Cough, unspecified: Secondary | ICD-10-CM

## 2023-05-01 DIAGNOSIS — M1711 Unilateral primary osteoarthritis, right knee: Secondary | ICD-10-CM | POA: Diagnosis not present

## 2023-05-01 MED ORDER — BENZONATATE 100 MG PO CAPS: 100.0000 mg | ORAL_CAPSULE | Freq: Three times a day (TID) | ORAL | 1 refills | Status: AC | PRN

## 2023-05-01 NOTE — Progress Notes (Unsigned)
TRANSITION OF CARE VISIT     Date of Admission: 04/13/2023  Date of Discharge: 04/14/2023  Transitions of Care Call: 04/15/2023  Discharged from: [facility]   Discharge Diagnosis:  Principal Problem:   Primary osteoarthritis of right knee Active Problems:   Status post total right knee replacement   Summary of Admission per PA note: Hospital Course: [name] [name] is an 67 y.o. female who was admitted 04/13/2023 for operative treatment ofPrimary osteoarthritis of right knee. Patient has severe unremitting pain that affects sleep, daily activities, and work/hobbies. After pre-op clearance the patient was taken to the operating room on 04/13/2023 and underwent  Procedure(s): RIGHT TOTAL KNEE ARTHROPLASTY.     Follow-Ups  Schedule an appointment with Cristie Hem, PA-C (Orthopedic Surgery) in 2 weeks (04/27/2023) Follow up with [facility] will contact you for the first home visit.  04/28/2023 [facility] per PA note:  Visit Diagnoses:  1. Status post total right knee replacement       Plan: Patient is a pleasant 67 year old female who comes in today 2 weeks status post right total knee replacement 04-13-23.  She has been doing okay.  She has been in a fair amount of pain and is taking oxycodone and Robaxin.  She has been compliant taking her Xarelto for DVT prophylaxis.  She has been getting home health physical therapy and is ambulating with a walker.  Examination of the right knee reveals a well healing surgical incision with nylon sutures in place.  No evidence of infection or cellulitis.  She does have moderate swelling of the right lower extremity.  Calf is soft and minimal tenderness.  She is neurovascularly intact distally.  Today, sutures were removed and Steri-Strips applied.  I have put in a referral for outpatient physical therapy.  I refilled her oxycodone.   She will continue taking the Xarelto for DVT prophylaxis.  Follow-up in 4 weeks for repeat evaluation and 2 view x-rays of the right knee.  Call with concerns or questions.   Follow-Up Instructions: Return in about 4 weeks (around 05/26/2023).    Today's visit 05/01/2023: Patient states continued knee and neck pain. Denies red flag symptoms. Established with Orthopedics. Intermittent cough. Denies red flag symptoms. No further issues/concerns for discussion today.   Patient/Caregiver self-reported problems/concerns:  see above  MEDICATIONS  Medication Reconciliation conducted with patient/caregiver? (Yes/ No): Yes  New medications prescribed/discontinued upon discharge? (Yes/No):  Yes  Barriers identified related to medications: No   LABS  Lab Reviewed (Yes/No/NA): Yes  PHYSICAL EXAM:     05/01/2023    2:40 PM 04/14/2023    7:54 AM 04/14/2023    5:12 AM  Vitals with BMI  Height 5' 8.5"    Weight 217 lbs    BMI 32.51    Systolic 111 165 213  Diastolic 71 91 87  Pulse 97 76 72   Physical Exam HENT:     Head: Normocephalic and atraumatic.     Nose: Nose normal.     Mouth/Throat:     Mouth: Mucous membranes are moist.     Pharynx: Oropharynx is clear.  Eyes:     Extraocular Movements: Extraocular movements intact.     Conjunctiva/sclera: Conjunctivae normal.     Pupils: Pupils are equal, round, and reactive to light.  Cardiovascular:     Rate and Rhythm: Normal rate and regular rhythm.     Pulses: Normal pulses.     Heart sounds:  Normal heart sounds.  Pulmonary:     Effort: Pulmonary effort is normal.     Breath sounds: Normal breath sounds.  Musculoskeletal:        General: Normal range of motion.     Right shoulder: Normal.     Left shoulder: Normal.     Right upper arm: Normal.     Left upper arm: Normal.     Right elbow: Normal.     Left elbow: Normal.     Right forearm: Normal.     Left forearm: Normal.     Right wrist: Normal.     Left wrist: Normal.      Right hand: Normal.     Left hand: Normal.     Cervical back: Normal, normal range of motion and neck supple.     Thoracic back: Normal.     Lumbar back: Normal.     Right hip: Normal.     Left hip: Normal.     Right upper leg: Normal.     Left upper leg: Normal.     Right knee: Normal.     Left knee: Normal.     Right lower leg: Normal.     Left lower leg: Normal.     Right ankle: Normal.     Left ankle: Normal.     Right foot: Normal.     Left foot: Normal.  Neurological:     General: No focal deficit present.     Mental Status: She is alert and oriented to person, place, and time.  Psychiatric:        Mood and Affect: Mood normal.        Behavior: Behavior normal.      ASSESSMENT AND PLAN: 1. Hospital discharge follow-up (Primary) - Reviewed hospital course, current medications, ensured proper follow-up in place, and addressed concerns.   2. Primary osteoarthritis of right knee 3. Status post total right knee replacement - Continue present management.  - Keep all scheduled appointments with Orthopedics.   4. Cough, unspecified type - Patient today in office with no cardiopulmonary/acute distress.  - Routine screening.  - Benzonatate capsules as prescribed. Counseled on medication adherence/adverse effects.  - Follow-up with primary provider as scheduled.  - COVID-19, Flu A+B and RSV - benzonatate (TESSALON PERLES) 100 MG capsule; Take 1 capsule (100 mg total) by mouth 3 (three) times daily as needed for cough.  Dispense: 30 capsule; Refill: 1   PATIENT EDUCATION PROVIDED: See AVS   FOLLOW-UP (Include any further testing or referrals):  - Keep all scheduled appointments with Orthopedics.  - Follow-up with primary provider as scheduled.   Patient was given clear instructions to go to Emergency Department or return to medical center if symptoms don't improve, worsen, or new problems develop.The patient verbalized understanding.

## 2023-05-01 NOTE — Progress Notes (Unsigned)
Patient state pain in back of neck, and pain in knees.

## 2023-05-12 ENCOUNTER — Ambulatory Visit
Admission: RE | Admit: 2023-05-12 | Discharge: 2023-05-12 | Disposition: A | Discharge status: Home / Self Care | Payer: Medicaid Other | Source: Ambulatory Visit | Attending: Hematology and Oncology | Admitting: Hematology and Oncology

## 2023-05-12 DIAGNOSIS — Z170 Estrogen receptor positive status [ER+]: Secondary | ICD-10-CM

## 2023-05-25 NOTE — Progress Notes (Signed)
 Post-Op Visit Note   [name]           Date of Birth: [birth year]           MRN: [id number] Visit Date: 05/26/2023 PCP: Lorren Greig PARAS, NP   Assessment & Plan:  Chief Complaint:  Chief Complaint  Patient presents with   Right Knee - Follow-up    Right total knee arthroplasty 04/13/2023   Visit Diagnoses:  1. Status post total left knee replacement   2. Status post total right knee replacement     Plan: Patient is now 6 weeks postop from a right total knee arthroplasty.  Doing well overall.  Reporting standard postsurgical symptoms.  Examination of the right knee shows a fully healed surgical scar.  Range of motion is progressing nicely.  Uses a cane for ambulation.  Implant looks stable on x-rays.  I am happy with her progress.  Continue physical therapy.  Recheck in 6 weeks.  Follow-Up Instructions: Return in about 6 weeks (around 07/07/2023) for with lindsey.   Orders:  Orders Placed This Encounter  Procedures   XR Knee 1-2 Views Right   No orders of the defined types were placed in this encounter.   Imaging: XR Knee 1-2 Views Right Result Date: 05/26/2023 X-rays of the right knee demonstrate a stable right total knee replacement in good alignment    PMFS History: Patient Active Problem List   Diagnosis Date Noted   Status post total right knee replacement 04/13/2023   Hyperlipidemia associated with type 2 diabetes mellitus (HCC) 03/09/2023   Primary hypertension 03/09/2023   Heart murmur 03/09/2023   Orthostatic hypotension 03/06/2023   Sepsis (HCC) 04/24/2022   Primary osteoarthritis of right knee 02/11/2022   Status post total left knee replacement 11/18/2021   Primary osteoarthritis of left knee 08/06/2021   Aortic atherosclerosis (HCC) 04/24/2020   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   Hypertension    Personal history of chemotherapy    Personal history of radiation therapy     Family History  Problem Relation Age of Onset   Asthma Mother    Arthritis Sister    Asthma Brother    Arthritis Brother     Past Surgical History:  Procedure Laterality Date   BREAST BIOPSY Right 02/03/2020   x2   BREAST BIOPSY Right 02/06/2020   x2   BREAST LUMPECTOMY Right 07/26/2020   BREAST LUMPECTOMY WITH RADIOACTIVE SEED AND SENTINEL LYMPH NODE BIOPSY Right 07/26/2020   Procedure: RIGHT BREAST LUMPECTOMY WITH RADIOACTIVE SEED X 2 AND SENTINEL LYMPH NODE BIOPSY;  Surgeon: Vanderbilt Ned, MD;  Location: [facility];  Service: General;  Laterality: Right;   IR IMAGING GUIDED PORT INSERTION  02/23/2020   PORT-A-CATH REMOVAL N/A 07/26/2020   Procedure: REMOVAL PORT-A-CATH;  Surgeon: Vanderbilt Ned, MD;  Location: [facility];  Service: General;  Laterality: N/A;   TOTAL KNEE ARTHROPLASTY Left 11/18/2021   Procedure: LEFT TOTAL KNEE ARTHROPLASTY;  Surgeon: Jerri Kay HERO, MD;  Location:  [facility];  Service: Orthopedics;  Laterality: Left;   TOTAL KNEE ARTHROPLASTY Right 04/13/2023   Procedure: RIGHT TOTAL KNEE ARTHROPLASTY;  Surgeon: Jerri Kay HERO, MD;  Location: [facility];  Service: Orthopedics;  Laterality: Right;   Social History   Occupational History   Not on file  Tobacco Use   Smoking status: Never   Smokeless tobacco: Never  Vaping Use   Vaping status: Never Used  Substance and Sexual Activity   Alcohol use: Yes    Alcohol/week: 1.0 standard drink of alcohol    Types: 1 Glasses of wine per week   Drug use: Never   Sexual activity: Not Currently

## 2023-05-26 ENCOUNTER — Encounter: Payer: Self-pay | Admitting: Orthopaedic Surgery

## 2023-05-26 ENCOUNTER — Other Ambulatory Visit: Payer: Self-pay

## 2023-05-26 ENCOUNTER — Other Ambulatory Visit (INDEPENDENT_AMBULATORY_CARE_PROVIDER_SITE_OTHER): Payer: Self-pay

## 2023-05-26 ENCOUNTER — Ambulatory Visit (INDEPENDENT_AMBULATORY_CARE_PROVIDER_SITE_OTHER): Payer: Medicaid Other | Admitting: Orthopaedic Surgery

## 2023-05-26 DIAGNOSIS — Z96652 Presence of left artificial knee joint: Secondary | ICD-10-CM

## 2023-05-26 DIAGNOSIS — Z96651 Presence of right artificial knee joint: Secondary | ICD-10-CM

## 2023-06-01 ENCOUNTER — Ambulatory Visit: Payer: Medicaid Other | Admitting: Podiatry

## 2023-06-10 ENCOUNTER — Ambulatory Visit: Payer: Medicaid Other | Attending: Cardiology | Admitting: Cardiology

## 2023-06-10 ENCOUNTER — Encounter: Payer: Self-pay | Admitting: Cardiology

## 2023-06-10 VITALS — BP 135/78 | HR 70 | Ht 69.0 in | Wt 220.4 lb

## 2023-06-10 DIAGNOSIS — E785 Hyperlipidemia, unspecified: Secondary | ICD-10-CM

## 2023-06-10 DIAGNOSIS — I700 Atherosclerosis of aorta: Secondary | ICD-10-CM

## 2023-06-10 DIAGNOSIS — I951 Orthostatic hypotension: Secondary | ICD-10-CM

## 2023-06-10 DIAGNOSIS — I358 Other nonrheumatic aortic valve disorders: Secondary | ICD-10-CM | POA: Insufficient documentation

## 2023-06-10 DIAGNOSIS — Z96651 Presence of right artificial knee joint: Secondary | ICD-10-CM

## 2023-06-10 DIAGNOSIS — I10 Essential (primary) hypertension: Secondary | ICD-10-CM

## 2023-06-10 DIAGNOSIS — E1169 Type 2 diabetes mellitus with other specified complication: Secondary | ICD-10-CM

## 2023-06-10 NOTE — Assessment & Plan Note (Addendum)
Hyperlipidemia Well controlled on Atorvastatin 40 mg daily.  (Most recent LDL was 55 as of June 2024) -Continue Atorvastatin as prescribed.  Diabetes Mellitus On Metformin. -Continue Metformin as prescribed.

## 2023-06-10 NOTE — Assessment & Plan Note (Signed)
Well controlled on Valsartan 40mg  daily. No symptoms of orthostatic hypotension since discontinuation of Carvedilol. -Continue Valsartan 40mg  daily.

## 2023-06-10 NOTE — Assessment & Plan Note (Addendum)
Seen on routine imaging studies. Continue cardiovascular risk factor modification with BP, lipid and glycemic control.

## 2023-06-10 NOTE — Progress Notes (Signed)
Cardiology Office Note:  .   Date:  06/10/2023  ID:  [name] [name], DOB 11-23-1955, MRN [id number] PCP: Rema Fendt, NP  [facility] Providers Cardiologist:  Bryan Lemma, MD     Chief Complaint  Patient presents with   Follow-up    Discussed test results and symptoms based on medication changes    Patient Profile: .     [name] [name] is a mildly obese 68 y.o. female (native of [place]) with a PMH notable for essential hypertension (with orthostatic hypotension), DM-2, HLD, prior DVT, H/O R Br CA who presents here for follow-up to discuss results of studies at the request of Rema Fendt, NP.   [name] [name] was seen on initial consultation on March 06, 2023 in consultation for systolic murmur and orthostatic hypotension. => Plan was to wean off of carvedilol completely and start taking low-dose valsartan 20 mg daily titrating to 40 mg daily.  2D echo ordered to assess murmur.  In the interim since her last visit she has had right knee arthroplasty (April 13, 2023).  I reviewed the op note and postop notes.  Subjective  Discussed the use of AI scribe software for clinical note transcription with the patient, who gave verbal consent to proceed.  History of Present Illness   The patient is a 67 year old female with hypertension and aortic sclerosis who presents for follow-up after knee surgery.  In October, there was concern about her blood pressure dropping and a heart murmur. She was taken off some medications, including carvedilol, and is currently on 40 mg of valsartan. No issues with dizziness or feeling faint upon standing. She is also on atorvastatin for cholesterol management and metformin. She was on Xarelto postoperatively but has since stopped.  An echocardiogram performed on April 06, 2023, showed normal left ventricular size and function with an ejection fraction of 55-60%. There is some left atrial dilation and mild diastolic dysfunction, likely related to  her history of hypertension. The aortic valve shows sclerosis without stenosis, and there is trivial regurgitation. Her aorta measures 40 mm, slightly above the upper limit of normal for her size.  No chest pain, pressure, or shortness of breath, both at rest and during activity. She experiences occasional palpitations described as 'a little skip' but no significant heart racing. She notes some ankle swelling if she stands for long periods, which resolves with elevation. No episodes of passing out, dizziness, or sudden changes in vision.  She recently underwent knee surgery and is now able to walk, although she is still getting used to the new knee and experiences some stiffness. She notes that she can walk more because it doesn't hurt as much.       Objective   Studies Reviewed: Marland Kitchen        DIAGNOSTIC Echocardiogram: Left ventricle size and function normal, ejection fraction 55-60%, no regional wall motion abnormalities, mild left atrial dilation, mitral valve normal, aortic valve sclerosis with trivial regurgitation, aorta 40 mm (04/06/2023)--echocardiographic films personally reviewed, I agree with the findings.  Risk Assessment/Calculations:          Physical Exam:   VS:  BP 135/78 (BP Location: Left Arm, Patient Position: Sitting)   Pulse 70   Ht 5\' 9"  (1.753 m)   Wt 220 lb 6.4 oz (100 kg)   SpO2 98%   BMI 32.55 kg/m    Wt Readings from Last 3 Encounters:  06/10/23 220 lb 6.4 oz (100 kg)  05/01/23 217 lb (98.4  kg)  04/13/23 225 lb (102.1 kg)    GEN: Well nourished, well well-groomed in no acute distress; mildly obese, but healthy-appearing NECK: No JVD; No carotid bruits CARDIAC: Normal S1, S2; RRR, no murmurs, rubs, gallops RESPIRATORY:  Clear to auscultation without rales, wheezing or rhonchi ; nonlabored, good air movement. ABDOMEN: Soft, non-tender, non-distended EXTREMITIES:  No edema; No deformity     ASSESSMENT AND PLAN: .    Problem List Items Addressed This Visit        Cardiology Problems   Aortic atherosclerosis (HCC) (Chronic)   Seen on routine imaging studies. Continue cardiovascular risk factor modification with BP, lipid and glycemic control.      Aortic valve sclerosis (Chronic)   Echocardiogram on 04/06/2023 showed aortic sclerosis without stenosis or significant regurgitation. Mild diastolic dysfunction and slight left atrial dilation noted, likely secondary to hypertension and age. -Continue current management and monitor for any new symptoms. -Repeat echocardiogram in 3 years to monitor aortic valve and aorta. -Consider CT scan of chest in 2-3 years to assess aorta, to be ordered by primary care provider.      Hyperlipidemia associated with type 2 diabetes mellitus (HCC) (Chronic)   Hyperlipidemia Well controlled on Atorvastatin 40 mg daily.  (Most recent LDL was 55 as of June 2024) -Continue Atorvastatin as prescribed.  Diabetes Mellitus On Metformin. -Continue Metformin as prescribed.      Orthostatic hypotension - Primary (Chronic)   Notable improvement having stopped carvedilol and increasing hydration. Feeling was that her symptoms were related to orthostasis from autonomic dysfunction and diabetes. Removing the beta-blocker was to allow for better cardiac responsiveness.  We subsequently were able to increase ARB dose to a full 40 mg daily.  -Continue off of carvedilol and with the changed to 40 mg of valsartan -Continue to ensure adequate hydration at home (3 to 4 20 ounce bottles a day) -Monitor home BP levels      Primary hypertension (Chronic)   Well controlled on Valsartan 40mg  daily. No symptoms of orthostatic hypotension since discontinuation of Carvedilol. -Continue Valsartan 40mg  daily.        Other   Status post total right knee replacement (Chronic)   Patient reports improved mobility and no complications. Has completed course of Xarelto postop. -No specific cardiac intervention needed at this time.         General Health Maintenance / Followup Plans -Return to primary care provider for routine follow-up and management of chronic conditions. -Return to cardiology if new symptoms develop or if murmur worsens on examination.  Follow-Up: Return if symptoms worsen or fail to improve, for Followup when necessary.    Signed, Marykay Lex, MD, MS Bryan Lemma, M.D., M.S. Interventional Cardiologist  [facility]  Pager # [phone] Phone # [phone] [address] [place], [place] [address]

## 2023-06-10 NOTE — Assessment & Plan Note (Signed)
Patient reports improved mobility and no complications. Has completed course of Xarelto postop. -No specific cardiac intervention needed at this time.

## 2023-06-10 NOTE — Assessment & Plan Note (Signed)
Echocardiogram on 04/06/2023 showed aortic sclerosis without stenosis or significant regurgitation. Mild diastolic dysfunction and slight left atrial dilation noted, likely secondary to hypertension and age. -Continue current management and monitor for any new symptoms. -Repeat echocardiogram in 3 years to monitor aortic valve and aorta. -Consider CT scan of chest in 2-3 years to assess aorta, to be ordered by primary care provider.

## 2023-06-10 NOTE — Assessment & Plan Note (Signed)
Notable improvement having stopped carvedilol and increasing hydration. Feeling was that her symptoms were related to orthostasis from autonomic dysfunction and diabetes. Removing the beta-blocker was to allow for better cardiac responsiveness.  We subsequently were able to increase ARB dose to a full 40 mg daily.  -Continue off of carvedilol and with the changed to 40 mg of valsartan -Continue to ensure adequate hydration at home (3 to 4 20 ounce bottles a day) -Monitor home BP levels

## 2023-06-10 NOTE — Patient Instructions (Signed)
Medication Instructions:   No changes     Lab Work: Not needed    Testing/Procedures:  Not needed  Follow-Up: At [facility], you and your health needs are our priority.  As part of our continuing mission to provide you with exceptional heart care, we have created designated Provider Care Teams.  These Care Teams include your primary Cardiologist (physician) and Advanced Practice Providers (APPs -  Physician Assistants and Nurse Practitioners) who all work together to provide you with the care you need, when you need it.     Your next appointment:   As needed   The format for your next appointment:   In Person  Provider:   Bryan Lemma, MD

## 2023-07-06 ENCOUNTER — Encounter: Payer: Self-pay | Admitting: Family

## 2023-07-06 ENCOUNTER — Other Ambulatory Visit: Payer: Self-pay | Admitting: *Deleted

## 2023-07-06 ENCOUNTER — Encounter: Payer: Self-pay | Admitting: Hematology and Oncology

## 2023-07-06 MED ORDER — ANASTROZOLE 1 MG PO TABS: 1.0000 mg | ORAL_TABLET | Freq: Every day | ORAL | 4 refills | Status: AC

## 2023-07-07 ENCOUNTER — Ambulatory Visit (INDEPENDENT_AMBULATORY_CARE_PROVIDER_SITE_OTHER): Payer: Medicaid Other | Admitting: Physician Assistant

## 2023-07-07 ENCOUNTER — Encounter: Payer: Self-pay | Admitting: Physician Assistant

## 2023-07-07 DIAGNOSIS — Z96651 Presence of right artificial knee joint: Secondary | ICD-10-CM

## 2023-07-07 NOTE — Progress Notes (Signed)
 Post-Op Visit Note   [name]           Date of Birth: 1955-11-10           MRN: [id number] Visit Date: 07/07/2023 PCP: Rema Fendt, NP   Assessment & Plan:  Chief Complaint:  Chief Complaint  Patient presents with   Right Knee - Follow-up    Right total knee arthroplasty 04/13/2023   Visit Diagnoses:  1. Status post total right knee replacement     Plan: Patient is a pleasant 68 year old female who comes in today 3 months status post right total knee replacement 04-13-23.  She has been doing well.  She has some discomfort to the knee as well as the lower back which is somewhat relieved with diclofenac.  She is still in physical therapy making great progress.  Examination of the right knee reveals range of motion from 0 to 125 degrees.  She is stable to valgus varus stress.  She is neurovascularly intact distally.  At this point coping she may discontinue formal physical therapy when she is ready and primarily work on a home exercise program.  Dental prophylaxis reinforced.  Follow-up in 3 months for repeat evaluation and 2 view x-rays of the right knee.  Call with concerns or questions.  Follow-Up Instructions: Return in about 3 months (around 10/04/2023).   Orders:  No orders of the defined types were placed in this encounter.  No orders of the defined types were placed in this encounter.   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   Hypertension    Personal history of chemotherapy    Personal history of radiation therapy     Family History  Problem Relation Age of Onset   Asthma Mother    Arthritis Sister    Asthma Brother    Arthritis Brother     Past Surgical History:  Procedure Laterality Date   BREAST BIOPSY Right 02/03/2020   x2   BREAST BIOPSY Right 02/06/2020   x2   BREAST LUMPECTOMY Right 07/26/2020   BREAST LUMPECTOMY WITH RADIOACTIVE SEED AND SENTINEL LYMPH NODE BIOPSY Right 07/26/2020   Procedure: RIGHT BREAST LUMPECTOMY WITH RADIOACTIVE SEED X 2 AND SENTINEL LYMPH NODE BIOPSY;  Surgeon: Harriette Bouillon, MD;  Location: [facility];  Service: General;  Laterality: Right;   IR IMAGING GUIDED PORT INSERTION  02/23/2020   PORT-A-CATH REMOVAL N/A 07/26/2020   Procedure: REMOVAL PORT-A-CATH;  Surgeon: Harriette Bouillon, MD;  Location: [facility];  Service: General;  Laterality: N/A;   TOTAL KNEE ARTHROPLASTY Left 11/18/2021   Procedure: LEFT TOTAL KNEE ARTHROPLASTY;  Surgeon: [name], MD;  Location: [facility];  Service: Orthopedics;  Laterality: Left;   TOTAL KNEE ARTHROPLASTY Right 04/13/2023   Procedure: RIGHT TOTAL KNEE ARTHROPLASTY;  Surgeon: [name], MD;  Location: [facility];  Service: Orthopedics;  Laterality: Right;   Social History   Occupational History   Not on file  Tobacco Use   Smoking status: Never   Smokeless tobacco: Never  Vaping Use   Vaping status: Never Used  Substance and Sexual Activity    Alcohol use: Yes    Alcohol/week: 1.0 standard drink of alcohol    Types: 1 Glasses of wine per week   Drug use: Never   Sexual activity: Not Currently

## 2023-07-14 ENCOUNTER — Other Ambulatory Visit: Payer: Self-pay | Admitting: Family

## 2023-07-14 ENCOUNTER — Other Ambulatory Visit: Payer: Self-pay

## 2023-07-14 DIAGNOSIS — K219 Gastro-esophageal reflux disease without esophagitis: Secondary | ICD-10-CM

## 2023-07-15 NOTE — Telephone Encounter (Signed)
 Requested Prescriptions  Pending Prescriptions Disp Refills   lansoprazole (PREVACID) 30 MG capsule [Pharmacy Med Name: Lansoprazole 30 MG Oral Capsule Delayed Release] 90 capsule 0    Sig: Take 1 capsule by mouth in the morning     Gastroenterology: Proton Pump Inhibitors 2 Passed - 07/15/2023 11:03 AM      Passed - ALT in normal range and within 360 days    ALT  Date Value Ref Range Status  10/16/2022 22 0 - 44 U/L Final         Passed - AST in normal range and within 360 days    AST  Date Value Ref Range Status  10/16/2022 25 15 - 41 U/L Final         Passed - Valid encounter within last 12 months    Recent Outpatient Visits           2 months ago Hospital discharge follow-up   [facility], [place], NP   6 months ago Encounter to establish care   [facility], Salomon Fick, NP       Future Appointments             In 2 months Ivin Poot [facility]

## 2023-07-29 ENCOUNTER — Telehealth: Payer: Self-pay | Admitting: Hematology and Oncology

## 2023-07-30 ENCOUNTER — Inpatient Hospital Stay: Payer: No Typology Code available for payment source | Admitting: Hematology and Oncology

## 2023-08-08 ENCOUNTER — Encounter: Payer: Self-pay | Admitting: Family

## 2023-08-13 ENCOUNTER — Ambulatory Visit (INDEPENDENT_AMBULATORY_CARE_PROVIDER_SITE_OTHER): Admitting: Family

## 2023-08-13 ENCOUNTER — Encounter: Payer: Self-pay | Admitting: Family

## 2023-08-13 VITALS — BP 115/73 | HR 74 | Temp 98.4°F | Ht 69.0 in | Wt 221.2 lb

## 2023-08-13 DIAGNOSIS — Z7984 Long term (current) use of oral hypoglycemic drugs: Secondary | ICD-10-CM | POA: Diagnosis not present

## 2023-08-13 DIAGNOSIS — E785 Hyperlipidemia, unspecified: Secondary | ICD-10-CM | POA: Diagnosis not present

## 2023-08-13 DIAGNOSIS — E1165 Type 2 diabetes mellitus with hyperglycemia: Secondary | ICD-10-CM | POA: Diagnosis not present

## 2023-08-13 DIAGNOSIS — E119 Type 2 diabetes mellitus without complications: Secondary | ICD-10-CM

## 2023-08-13 MED ORDER — METFORMIN HCL 500 MG PO TABS: 500.0000 mg | ORAL_TABLET | Freq: Every day | ORAL | 0 refills | Status: DC

## 2023-08-13 MED ORDER — TRUE METRIX BLOOD GLUCOSE TEST VI STRP: 1.0000 | ORAL_STRIP | Freq: Three times a day (TID) | 12 refills | Status: AC

## 2023-08-13 MED ORDER — ATORVASTATIN CALCIUM 40 MG PO TABS
40.0000 mg | ORAL_TABLET | Freq: Every day | ORAL | 0 refills | Status: DC
Start: 2023-08-13 — End: 2023-10-02

## 2023-08-13 MED ORDER — TRUEPLUS LANCETS 28G MISC: 1.0000 | Freq: Three times a day (TID) | 4 refills | Status: DC

## 2023-08-13 MED ORDER — TRUE METRIX METER W/DEVICE KIT: 1.0000 | PACK | Freq: Three times a day (TID) | 0 refills | Status: DC

## 2023-08-13 NOTE — Progress Notes (Signed)
 Patient asking for machine to check blood sugar.

## 2023-08-13 NOTE — Progress Notes (Signed)
 Patient ID: [name] [name], female    DOB: Sep 21, 1955  MRN: [id number]  CC: Chronic Conditions Follow-Up  Subjective: [name] [name] is a 68 y.o. female who presents for chronic conditions follow-up.   Her concerns today include:  - States she is no longer taking Metformin 1000 mg twice daily per her preference. States she has been taking Metformin 500 mg once daily and home blood sugars have been normal. Denies red flag symptoms associated with diabetes. Requests diabetic testing supplies.  - Established with Cardiology for chronic conditions management. Reports she needs refills of Atorvastatin.   Patient Active Problem List   Diagnosis Date Noted   Aortic valve sclerosis 06/10/2023   Status post total right knee replacement 04/13/2023   Hyperlipidemia associated with type 2 diabetes mellitus (HCC) 03/09/2023   Primary hypertension 03/09/2023   Heart murmur 03/09/2023   Orthostatic hypotension 03/06/2023   Sepsis (HCC) 04/24/2022   Primary osteoarthritis of right knee 02/11/2022   Status post total left knee replacement 11/18/2021   Primary osteoarthritis of left knee 08/06/2021   Aortic atherosclerosis (HCC) 04/24/2020   Deep venous thrombosis of left upper extremity (HCC) 04/24/2020   Port-A-Cath in place 03/26/2020   Malignant neoplasm of upper-outer quadrant of right breast in female, estrogen receptor positive (HCC) 02/13/2020     Current Outpatient Medications on File Prior to Visit  Medication Sig Dispense Refill   anastrozole (ARIMIDEX) 1 MG tablet Take 1 tablet (1 mg total) by mouth daily. 90 tablet 4   calcium-vitamin D (OSCAL WITH D) 500-5 MG-MCG tablet Take 1 tablet by mouth daily.     diclofenac (VOLTAREN) 75 MG EC tablet Take 75 mg by mouth 2 (two) times daily.     lansoprazole (PREVACID) 30 MG capsule Take 1 capsule by mouth in the morning 90 capsule 0   Magnesium 100 MG CAPS Take 100 mg by mouth daily.     metFORMIN (GLUCOPHAGE) 1000 MG tablet Take 1 tablet (1,000  mg total) by mouth 2 (two) times daily with a meal. 180 tablet 0   Multiple Vitamins-Minerals (CENTRUM SILVER 50+WOMEN PO) Take 1 capsule by mouth daily.     UNABLE TO FIND Take 1 Scoop by mouth daily. Med Name: Ivan Anchors     valsartan (DIOVAN) 40 MG tablet Take 1 tablet (40 mg total) by mouth daily. 90 tablet 3   vitamin C (ASCORBIC ACID) 500 MG tablet Take 500 mg by mouth daily.     benzonatate (TESSALON PERLES) 100 MG capsule Take 1 capsule (100 mg total) by mouth 3 (three) times daily as needed for cough. (Patient not taking: Reported on 08/13/2023) 30 capsule 1   docusate sodium (COLACE) 100 MG capsule Take 1 capsule (100 mg total) by mouth 2 (two) times daily. (Patient not taking: Reported on 08/13/2023) 10 capsule 0   doxycycline (VIBRAMYCIN) 100 MG capsule Take 1 capsule (100 mg total) by mouth 2 (two) times daily. (Patient not taking: Reported on 08/13/2023) 20 capsule 0   methocarbamol (ROBAXIN) 500 MG tablet Take 1 tablet (500 mg total) by mouth every 6 (six) hours as needed for muscle spasms. (Patient not taking: Reported on 08/13/2023) 20 tablet 2   ondansetron (ZOFRAN) 4 MG tablet Take 1 tablet (4 mg total) by mouth every 6 (six) hours as needed for nausea. (Patient not taking: Reported on 08/13/2023) 20 tablet 0   oxyCODONE (OXY IR/ROXICODONE) 5 MG immediate release tablet Take 1-2 tablets (5-10 mg total) by mouth 3 (three) times daily as  needed for moderate pain (pain score 4-6) (pain score 4-6). (Patient not taking: Reported on 08/13/2023) 36 tablet 0   rivaroxaban (XARELTO) 10 MG TABS tablet Take 1 tablet (10 mg total) by mouth daily with breakfast. To be taken after surgery to prevent blood clots (Patient not taking: Reported on 05/01/2023) 30 tablet 0   No current facility-administered medications on file prior to visit.    Allergies  Allergen Reactions   Sulfa Antibiotics Itching    Social History   Socioeconomic History   Marital status: Single    Spouse name: Not on file   Number  of children: 1   Years of education: Not on file   Highest education level: 9th grade  Occupational History   Not on file  Tobacco Use   Smoking status: Never   Smokeless tobacco: Never  Vaping Use   Vaping status: Never Used  Substance and Sexual Activity   Alcohol use: Yes    Alcohol/week: 1.0 standard drink of alcohol    Types: 1 Glasses of wine per week   Drug use: Never   Sexual activity: Not Currently  Other Topics Concern   Not on file  Social History Narrative   Tobacco use, amount per day now:   Past tobacco use, amount per day:   How many years did you use tobacco:   Alcohol use (drinks per week): 1 drink occasionally.   Diet:   Do you drink/eat things with caffeine: Yes   Marital status:  Single                                What year were you married?   Do you live in a house, apartment, assisted living, condo, trailer, etc.? House   Is it one or more stories? One   How many persons live in your home? 5   Do you have pets in your home?( please list) Cat   Highest Level of education completed: High School.   Current or past profession: Psychologist, sport and exercise.   Do you exercise?  Yes                                   Type and how often? 2 times weekly.   Do you have a living will? No   Do you have a DNR form?    No                               If not, do you want to discuss one?   Do you have signed POA/HPOA forms?     No                   If so, please bring to you appointment    Do you have any difficulty bathing or dressing yourself? No   Do you have difficulty preparing food or eating? No   Do you have difficulty managing your medications? No   Do you have any difficulty managing your finances? No   Do you have any difficulty affording your medications? No          Social Drivers of Corporate investment banker Strain: Not on file  Food Insecurity: No Food Insecurity (04/25/2022)   Hunger Vital Sign    Worried About Running Out of  Food in the Last Year: Never  true    Ran Out of Food in the Last Year: Never true  Transportation Needs: No Transportation Needs (04/25/2022)   PRAPARE - Administrator, Civil Service (Medical): No    Lack of Transportation (Non-Medical): No  Physical Activity: Not on file  Stress: Not on file  Social Connections: Not on file  Intimate Partner Violence: Not At Risk (04/25/2022)   Humiliation, Afraid, Rape, and Kick questionnaire    Fear of Current or Ex-Partner: No    Emotionally Abused: No    Physically Abused: No    Sexually Abused: No    Family History  Problem Relation Age of Onset   Asthma Mother    Arthritis Sister    Asthma Brother    Arthritis Brother     Past Surgical History:  Procedure Laterality Date   BREAST BIOPSY Right 02/03/2020   x2   BREAST BIOPSY Right 02/06/2020   x2   BREAST LUMPECTOMY Right 07/26/2020   BREAST LUMPECTOMY WITH RADIOACTIVE SEED AND SENTINEL LYMPH NODE BIOPSY Right 07/26/2020   Procedure: RIGHT BREAST LUMPECTOMY WITH RADIOACTIVE SEED X 2 AND SENTINEL LYMPH NODE BIOPSY;  Surgeon: Harriette Bouillon, MD;  Location: [facility];  Service: General;  Laterality: Right;   IR IMAGING GUIDED PORT INSERTION  02/23/2020   PORT-A-CATH REMOVAL N/A 07/26/2020   Procedure: REMOVAL PORT-A-CATH;  Surgeon: Harriette Bouillon, MD;  Location: [facility];  Service: General;  Laterality: N/A;   TOTAL KNEE ARTHROPLASTY Left 11/18/2021   Procedure: LEFT TOTAL KNEE ARTHROPLASTY;  Surgeon: [name], MD;  Location: [facility];  Service: Orthopedics;  Laterality: Left;   TOTAL KNEE ARTHROPLASTY Right 04/13/2023   Procedure: RIGHT TOTAL KNEE ARTHROPLASTY;  Surgeon: [name], MD;  Location: [facility];  Service: Orthopedics;  Laterality: Right;    ROS: Review of Systems Negative except as stated above  PHYSICAL EXAM: BP 115/73   Pulse 74   Temp 98.4 F (36.9 C) (Oral)   Ht 5\' 9"  (1.753 m)   Wt 221 lb 3.2 oz (100.3 kg)   SpO2 98%   BMI 32.67 kg/m    Physical Exam HENT:     Head: Normocephalic and atraumatic.     Nose: Nose normal.     Mouth/Throat:     Mouth: Mucous membranes are moist.     Pharynx: Oropharynx is clear.  Eyes:     Extraocular Movements: Extraocular movements intact.     Conjunctiva/sclera: Conjunctivae normal.     Pupils: Pupils are equal, round, and reactive to light.  Cardiovascular:     Rate and Rhythm: Normal rate and regular rhythm.     Pulses: Normal pulses.     Heart sounds: Normal heart sounds.  Pulmonary:     Effort: Pulmonary effort is normal.     Breath sounds: Normal breath sounds.  Musculoskeletal:        General: Normal range of motion.     Cervical back: Normal range of motion and neck supple.  Neurological:     General: No focal deficit present.     Mental Status: She is alert and oriented to person, place, and time.  Psychiatric:        Mood and Affect: Mood normal.        Behavior: Behavior normal.    ASSESSMENT AND PLAN: 1. Type 2 diabetes mellitus with hyperglycemia, without long-term current use of insulin (HCC) (Primary) - Continue Metformin as prescribed.  -  Hemoglobin A1c result pending.  - Routine screening.  - Diabetic testing supplies prescribed.  - Discussed the importance of healthy eating habits, low-carbohydrate diet, low-sugar diet, regular aerobic exercise (at least 150 minutes a week as tolerated) and medication compliance to achieve or maintain control of diabetes. - Follow-up with primary provider as scheduled.  - metFORMIN (GLUCOPHAGE) 500 MG tablet; Take 1 tablet (500 mg total) by mouth daily with breakfast.  Dispense: 90 tablet; Refill: 0 - Basic Metabolic Panel - Hemoglobin A1c - Blood Glucose Monitoring Suppl (TRUE METRIX METER) w/Device KIT; 1 kit by Other route 4 (four) times daily -  before meals and at bedtime. Use as directed  Dispense: 1 kit; Refill: 0 - glucose blood (TRUE METRIX BLOOD GLUCOSE TEST) test strip; 1 each by Other route 4 (four) times daily  -  before meals and at bedtime. Use as instructed  Dispense: 100 each; Refill: 12 - TRUEplus Lancets 28G MISC; 1 each by Other route 4 (four) times daily -  before meals and at bedtime. Use as directed  Dispense: 100 each; Refill: 4  2. Encounter for diabetic foot exam [facility]) - Referral to Podiatry for evaluation/management. - Ambulatory referral to Podiatry  3. Hyperlipidemia, unspecified hyperlipidemia type - Continue Atorvastatin as prescribed. Counseled on medication adherence/adverse effects.  - Keep all scheduled appointments with Cardiology. - atorvastatin (LIPITOR) 40 MG tablet; Take 1 tablet (40 mg total) by mouth daily.  Dispense: 90 tablet; Refill: 0   Patient was given the opportunity to ask questions.  Patient verbalized understanding of the plan and was able to repeat key elements of the plan. Patient was given clear instructions to go to Emergency Department or return to medical center if symptoms don't improve, worsen, or new problems develop.The patient verbalized understanding.   Orders Placed This Encounter  Procedures   Basic Metabolic Panel   Hemoglobin A1c   Ambulatory referral to Podiatry     Requested Prescriptions   Signed Prescriptions Disp Refills   atorvastatin (LIPITOR) 40 MG tablet 90 tablet 0    Sig: Take 1 tablet (40 mg total) by mouth daily.   metFORMIN (GLUCOPHAGE) 500 MG tablet 90 tablet 0    Sig: Take 1 tablet (500 mg total) by mouth daily with breakfast.   Blood Glucose Monitoring Suppl (TRUE METRIX METER) w/Device KIT 1 kit 0    Sig: 1 kit by Other route 4 (four) times daily -  before meals and at bedtime. Use as directed   glucose blood (TRUE METRIX BLOOD GLUCOSE TEST) test strip 100 each 12    Sig: 1 each by Other route 4 (four) times daily -  before meals and at bedtime. Use as instructed   TRUEplus Lancets 28G MISC 100 each 4    Sig: 1 each by Other route 4 (four) times daily -  before meals and at bedtime. Use as directed    Return in  about 3 months (around 11/12/2023) for Follow-Up or next available chronic conditions.  Rema Fendt, NP

## 2023-08-14 ENCOUNTER — Other Ambulatory Visit: Payer: Self-pay | Admitting: Family

## 2023-08-14 ENCOUNTER — Encounter: Payer: Self-pay | Admitting: Family

## 2023-08-14 DIAGNOSIS — J3089 Other allergic rhinitis: Secondary | ICD-10-CM

## 2023-08-14 LAB — BASIC METABOLIC PANEL WITH GFR
BUN/Creatinine Ratio: 16 (ref 12–28)
BUN: 11 mg/dL (ref 8–27)
CO2: 23 mmol/L (ref 20–29)
Calcium: 9.7 mg/dL (ref 8.7–10.3)
Chloride: 103 mmol/L (ref 96–106)
Creatinine, Ser: 0.7 mg/dL (ref 0.57–1.00)
Glucose: 92 mg/dL (ref 70–99)
Potassium: 5.3 mmol/L — ABNORMAL HIGH (ref 3.5–5.2)
Sodium: 141 mmol/L (ref 134–144)
eGFR: 96 mL/min/{1.73_m2} (ref >59)

## 2023-08-14 LAB — HEMOGLOBIN A1C
Est. average glucose Bld gHb Est-mCnc: 146 mg/dL
Hgb A1c MFr Bld: 6.7 % — ABNORMAL HIGH (ref 4.8–5.6)

## 2023-08-14 MED ORDER — CETIRIZINE HCL 10 MG PO TABS: 10.0000 mg | ORAL_TABLET | Freq: Every day | ORAL | 2 refills | Status: AC

## 2023-08-14 NOTE — Telephone Encounter (Signed)
 Complete

## 2023-09-01 ENCOUNTER — Ambulatory Visit (INDEPENDENT_AMBULATORY_CARE_PROVIDER_SITE_OTHER): Admitting: Podiatry

## 2023-09-01 DIAGNOSIS — B351 Tinea unguium: Secondary | ICD-10-CM

## 2023-09-01 DIAGNOSIS — M79675 Pain in left toe(s): Secondary | ICD-10-CM | POA: Diagnosis not present

## 2023-09-01 DIAGNOSIS — E1142 Type 2 diabetes mellitus with diabetic polyneuropathy: Secondary | ICD-10-CM

## 2023-09-01 DIAGNOSIS — M79674 Pain in right toe(s): Secondary | ICD-10-CM

## 2023-09-01 DIAGNOSIS — E119 Type 2 diabetes mellitus without complications: Secondary | ICD-10-CM

## 2023-09-01 NOTE — Progress Notes (Signed)
  Subjective:  Patient ID: [name] [name], female    DOB: 05/02/1956,   MRN: [id number]  Chief Complaint  Patient presents with   Nail Problem    DFC.    68 y.o. female presents for concern of thickened elongated and painful nails that are difficult to trim. Requesting to have them trimmed today. Relates burning and tingling in their feet. Patient is diabetic and last A1c was  Lab Results  Component Value Date   HGBA1C 6.7 (H) 08/13/2023   .   PCP:  Senaida Dama, NP    . Denies any other pedal complaints. Denies n/v/f/c.   Past Medical History:  Diagnosis Date   Arthritis    Breast cancer (HCC)    right   Diabetes mellitus without complication (HCC)    GERD (gastroesophageal reflux disease)    Headache    sometimes   History of MRI    Knee, Hip, and Back.   Hypertension    Personal history of chemotherapy    Personal history of radiation therapy     Objective:  Physical Exam: Vascular: DP/PT pulses 2/4 bilateral. CFT <3 seconds. Absent hair growth on digits. Edema noted to bilateral lower extremities. Xerosis noted bilaterally.  Skin. No lacerations or abrasions bilateral feet. Nails 1-5 bilateral  are thickened discolored and elongated with subungual debris.  Musculoskeletal: MMT 5/5 bilateral lower extremities in DF, PF, Inversion and Eversion. Deceased ROM in DF of ankle joint.  Neurological: Sensation intact to light touch. Protective sensation diminished bilateral.    Assessment:   1. Pain due to onychomycosis of toenails of both feet   2. Type 2 diabetes mellitus with diabetic polyneuropathy, without long-term current use of insulin  (HCC)      Plan:  Patient was evaluated and treated and all questions answered. -Discussed and educated patient on diabetic foot care, especially with  regards to the vascular, neurological and musculoskeletal systems.  -Stressed the importance of good glycemic control and the detriment of not  controlling glucose levels in  relation to the foot. -Discussed supportive shoes at all times and checking feet regularly.  -Mechanically debrided all nails 1-5 bilateral using sterile nail nipper and filed with dremel without incident  -Answered all patient questions -Patient to return  in 3 months for at risk foot care -Patient advised to call the office if any problems or questions arise in the meantime.   Jennefer Moats, DPM

## 2023-09-08 ENCOUNTER — Other Ambulatory Visit: Payer: Self-pay | Admitting: Family

## 2023-09-08 DIAGNOSIS — E1165 Type 2 diabetes mellitus with hyperglycemia: Secondary | ICD-10-CM

## 2023-10-01 ENCOUNTER — Other Ambulatory Visit: Payer: Self-pay | Admitting: Family

## 2023-10-01 DIAGNOSIS — E785 Hyperlipidemia, unspecified: Secondary | ICD-10-CM

## 2023-10-06 ENCOUNTER — Ambulatory Visit: Payer: Medicaid Other | Admitting: Physician Assistant

## 2023-10-12 ENCOUNTER — Other Ambulatory Visit: Payer: Self-pay | Admitting: Family

## 2023-10-12 ENCOUNTER — Telehealth: Payer: Self-pay

## 2023-10-12 DIAGNOSIS — K219 Gastro-esophageal reflux disease without esophagitis: Secondary | ICD-10-CM

## 2023-10-12 NOTE — Telephone Encounter (Signed)
 Verbally confirmed appt for 6/3

## 2023-10-13 ENCOUNTER — Ambulatory Visit: Admitting: Physician Assistant

## 2023-10-13 ENCOUNTER — Inpatient Hospital Stay

## 2023-10-13 ENCOUNTER — Other Ambulatory Visit: Payer: Self-pay | Admitting: *Deleted

## 2023-10-13 ENCOUNTER — Inpatient Hospital Stay: Attending: Hematology and Oncology | Admitting: Hematology and Oncology

## 2023-10-13 ENCOUNTER — Other Ambulatory Visit (INDEPENDENT_AMBULATORY_CARE_PROVIDER_SITE_OTHER): Payer: Self-pay

## 2023-10-13 VITALS — BP 131/75 | HR 74 | Temp 98.4°F | Resp 17 | Wt 228.4 lb

## 2023-10-13 DIAGNOSIS — C50411 Malignant neoplasm of upper-outer quadrant of right female breast: Secondary | ICD-10-CM | POA: Diagnosis not present

## 2023-10-13 DIAGNOSIS — Z170 Estrogen receptor positive status [ER+]: Secondary | ICD-10-CM | POA: Insufficient documentation

## 2023-10-13 DIAGNOSIS — Z79899 Other long term (current) drug therapy: Secondary | ICD-10-CM | POA: Diagnosis not present

## 2023-10-13 DIAGNOSIS — Z9221 Personal history of antineoplastic chemotherapy: Secondary | ICD-10-CM | POA: Diagnosis not present

## 2023-10-13 DIAGNOSIS — Z96651 Presence of right artificial knee joint: Secondary | ICD-10-CM

## 2023-10-13 DIAGNOSIS — Z923 Personal history of irradiation: Secondary | ICD-10-CM | POA: Insufficient documentation

## 2023-10-13 DIAGNOSIS — Z1732 Human epidermal growth factor receptor 2 negative status: Secondary | ICD-10-CM | POA: Insufficient documentation

## 2023-10-13 DIAGNOSIS — Z1722 Progesterone receptor negative status: Secondary | ICD-10-CM | POA: Diagnosis not present

## 2023-10-13 DIAGNOSIS — Z79811 Long term (current) use of aromatase inhibitors: Secondary | ICD-10-CM | POA: Insufficient documentation

## 2023-10-13 DIAGNOSIS — C50811 Malignant neoplasm of overlapping sites of right female breast: Secondary | ICD-10-CM | POA: Diagnosis present

## 2023-10-13 NOTE — Progress Notes (Signed)
 Post-Op Visit Note   [name]           Date of Birth: 1956-03-23           MRN: [id number] Visit Date: 10/13/2023 PCP: Senaida Dama, NP   Assessment & Plan:  Chief Complaint:  Chief Complaint  Patient presents with   Right Knee - Follow-up    Right total knee arthroplasty 04/13/2023   Visit Diagnoses:  1. Status post total right knee replacement     Plan: Patient is a pleasant 68 year old female who comes in today 6 months status post right total knee replacement 05-05-23.  She has been doing well.  No complaints other than some mild stiffness at times.  Examination of her right knee reveals range of motion from 0 to 125 degrees.  She is stable to valgus varus stress.  She is neurovascularly intact distally.  At this point, she will advance with activity as tolerated.  She will follow-up with us  in 6 months for repeat evaluation and 2 view x-rays of the right knee.  Call with concerns or questions.  Follow-Up Instructions: Return in about 6 months (around 04/13/2024).   Orders:  Orders Placed This Encounter  Procedures   XR Knee 1-2 Views Right   No orders of the defined types were placed in this encounter.   Imaging: XR Knee 1-2 Views Right Result Date: 10/13/2023 X-rays demonstrate a well-seated prosthesis without complication   PMFS History: Patient Active Problem List   Diagnosis Date Noted   Aortic valve sclerosis 06/10/2023   Status post total right knee replacement 04/13/2023   Hyperlipidemia associated with type 2 diabetes mellitus (HCC) 03/09/2023   Primary hypertension 03/09/2023   Heart murmur 03/09/2023   Orthostatic hypotension 03/06/2023   Sepsis (HCC) 04/24/2022   Primary osteoarthritis of right knee 02/11/2022   Status post total left knee replacement 11/18/2021   Primary osteoarthritis of left knee 08/06/2021   Aortic atherosclerosis (HCC) 04/24/2020   Deep venous thrombosis of left upper extremity (HCC) 04/24/2020   Port-A-Cath in  place 03/26/2020   Malignant neoplasm of upper-outer quadrant of right breast in female, estrogen receptor positive (HCC) 02/13/2020   Past Medical History:  Diagnosis Date   Arthritis    Breast cancer (HCC)    right   Diabetes mellitus without complication (HCC)    GERD (gastroesophageal reflux disease)    Headache    sometimes   History of MRI    Knee, Hip, and Back.   Hypertension    Personal history of chemotherapy    Personal history of radiation therapy     Family History  Problem Relation Age of Onset   Asthma Mother    Arthritis Sister    Asthma Brother    Arthritis Brother     Past Surgical History:  Procedure Laterality Date   BREAST BIOPSY Right 02/03/2020   x2   BREAST BIOPSY Right 02/06/2020   x2   BREAST LUMPECTOMY Right 07/26/2020   BREAST LUMPECTOMY WITH RADIOACTIVE SEED AND SENTINEL LYMPH NODE BIOPSY Right 07/26/2020   Procedure: RIGHT BREAST LUMPECTOMY WITH RADIOACTIVE SEED X 2 AND SENTINEL LYMPH NODE BIOPSY;  Surgeon: Sim Dryer, MD;  Location: [facility];  Service: General;  Laterality: Right;   IR IMAGING GUIDED PORT INSERTION  02/23/2020   PORT-A-CATH REMOVAL N/A 07/26/2020   Procedure: REMOVAL PORT-A-CATH;  Surgeon: Sim Dryer, MD;  Location: [facility];  Service: General;  Laterality: N/A;   TOTAL KNEE  ARTHROPLASTY Left 11/18/2021   Procedure: LEFT TOTAL KNEE ARTHROPLASTY;  Surgeon: Wes Hamman, MD;  Location: [facility];  Service: Orthopedics;  Laterality: Left;   TOTAL KNEE ARTHROPLASTY Right 04/13/2023   Procedure: RIGHT TOTAL KNEE ARTHROPLASTY;  Surgeon: Wes Hamman, MD;  Location: [facility];  Service: Orthopedics;  Laterality: Right;   Social History   Occupational History   Not on file  Tobacco Use   Smoking status: Never   Smokeless tobacco: Never  Vaping Use   Vaping status: Never Used  Substance and Sexual Activity   Alcohol use: Yes    Alcohol/week: 1.0 standard drink of alcohol    Types: 1  Glasses of wine per week   Drug use: Never   Sexual activity: Not Currently

## 2023-10-13 NOTE — Progress Notes (Signed)
 [facility]  [phone] [phone]     ID: Edeline Greening DOB: 18-Mar-1956  MR#: [id number]  CSN#:[id number]  Patient Care Team: Senaida Dama, NP as PCP - General (Nurse Practitioner) Arleen Lacer, MD as PCP - Cardiology (Cardiology) Auther Bo, RN as Oncology Nurse Navigator Alane Hsu, RN as Oncology Nurse Navigator Sim Dryer, MD as Consulting Physician (General Surgery) Colie Dawes, MD as Attending Physician (Radiation Oncology) Murleen Arms, MD as Medical Oncologist (Hematology and Oncology) Althea Atkinson, RPH-CPP as Pharmacist (Hematology and Oncology) Murleen Arms, MD  CHIEF COMPLAINT: Estrogen receptor positive breast cancer  CURRENT TREATMENT: Anastrozole    INTERVAL HISTORY:  Sebrina returns today for follow up of her estrogen receptor positive breast cancer. She is on anastrozole  alone for adjuvant anti estrogen therapy.   Discussed the use of AI scribe software for clinical note transcription with the patient, who gave verbal consent to proceed.  History of Present Illness Daje Stark is a 68 year old female with hypertension and breast cancer who presents for follow-up.  She has not experienced any more dizzy spells since her blood pressure medication was adjusted. Previously, she experienced dizziness and fainting due to a high dose of her medication. She is currently taking valsartan  40 mg.  She is also on metformin , which was reduced from 1000 mg to 500 mg once daily. She has gained weight and wants to lose it.  She continues to take anastrozole  for breast cancer and reports no changes in her condition. Her last mammogram in December did not show anything concerning for cancer. She engages in walking and sometimes uses a stationary bike for exercise.  No changes in breathing, bowel habits, or urination. Her brother recently passed away from cancer at the age of 25.    COVID 19 VACCINATION STATUS: s/p   [employer]  X2, most recently June 2021, no booster as of 05/01/2020   HISTORY OF CURRENT ILLNESS: From the original intake note:  Angela Vazguez herself palpated a mass in the right axilla "a long time ago", more recently with complaints of associated pain. She brought it to medical attention and underwent bilateral diagnostic mammography with tomography and bilateral breast ultrasonography at [facility] on 01/31/2020 showing: breast density category B; three right breast masses-- 1.9 cm at 12 o'clock, 2.3 cm at 11 o'clock, 0.4 cm at 1 o'clock; two adjacent enlarged thickened lymph nodes measuring up to 2.1 cm; no evidence of left breast malignancy.  Accordingly on 02/03/2020 she proceeded to biopsy of the right breast area at 12 o'clock and right axilla. The pathology from this procedure ([id number]) showed: invasive ductal carcinoma, grade 3. Prognostic indicators significant for: estrogen receptor, 100% positive with strong staining intensity and progesterone receptor, 0% negative. Proliferation marker Ki67 at 60%. HER2 equivocal by immunohistochemistry (2+), but negative by fluorescent in situ hybridization with a signals ratio 0.81 and number per cell 2.7.  The biopsied lymph node was found to show metastatic carcinoma. The morphology was considered different from the biopsied mass, and a prognostic panel was performed. Estrogen receptor, 100% positive with strong staining intensity and progesterone receptor, 0% negative. Proliferation marker Ki67 at 70%. HER2 equivcoal by immunohistochemistry (2+), but negative) by fluorescent in situ hybridization with a signals ratio 1.16 and number per cell 3.9.  She underwent additional right breast biopsies on 02/06/2020. Pathology [id number]) from the mass at 11 o'clock showed: invasive ductal carcinoma, grade 2. Prognostic indicators significant for: estrogen receptor, 100% positive with strong  staining intensity and progesterone receptor, 0% negative.  Proliferation marker Ki67 at 15%. HER2 equivocal by immunohistochemistry (2+), but negative by fluorescent in situ hybridization with a signals ratio 1.29 and number per cell 2.7.  The biopsied mass at 1 o'clock showed only fibrocystic change.  The patient's subsequent history is as detailed below.   PAST MEDICAL HISTORY: Past Medical History:  Diagnosis Date   Arthritis    Breast cancer (HCC)    right   Diabetes mellitus without complication (HCC)    GERD (gastroesophageal reflux disease)    Headache    sometimes   History of MRI    Knee, Hip, and Back.   Hypertension    Personal history of chemotherapy    Personal history of radiation therapy     PAST SURGICAL HISTORY: Past Surgical History:  Procedure Laterality Date   BREAST BIOPSY Right 02/03/2020   x2   BREAST BIOPSY Right 02/06/2020   x2   BREAST LUMPECTOMY Right 07/26/2020   BREAST LUMPECTOMY WITH RADIOACTIVE SEED AND SENTINEL LYMPH NODE BIOPSY Right 07/26/2020   Procedure: RIGHT BREAST LUMPECTOMY WITH RADIOACTIVE SEED X 2 AND SENTINEL LYMPH NODE BIOPSY;  Surgeon: Sim Dryer, MD;  Location: [facility];  Service: General;  Laterality: Right;   IR IMAGING GUIDED PORT INSERTION  02/23/2020   PORT-A-CATH REMOVAL N/A 07/26/2020   Procedure: REMOVAL PORT-A-CATH;  Surgeon: Sim Dryer, MD;  Location: [facility];  Service: General;  Laterality: N/A;   TOTAL KNEE ARTHROPLASTY Left 11/18/2021   Procedure: LEFT TOTAL KNEE ARTHROPLASTY;  Surgeon: Wes Hamman, MD;  Location: [facility];  Service: Orthopedics;  Laterality: Left;   TOTAL KNEE ARTHROPLASTY Right 04/13/2023   Procedure: RIGHT TOTAL KNEE ARTHROPLASTY;  Surgeon: Wes Hamman, MD;  Location: [facility];  Service: Orthopedics;  Laterality: Right;    FAMILY HISTORY: Family History  Problem Relation Age of Onset   Asthma Mother    Arthritis Sister    Asthma Brother    Arthritis Brother   [name] died in his 56s from causes  unknown to the patient.  [name] died from heart disease at age 71.  The patient had 4 brothers, 1 sister, with no history of cancer in the family to her knowledge   GYNECOLOGIC HISTORY:  No LMP recorded. Patient is postmenopausal. Menarche: 68 years old Age at first live birth: 68 years old GX P 1 LMP 83s Contraceptive HRT no  Hysterectomy? no BSO? no   SOCIAL HISTORY: (updated 02/2020)  [place] is originally from [place].  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  She describes herself as single.  At home she lives with her daughter Fontaine Ice who works for [employer], her daughters' husband Daren Eck who is a Insurance underwriter, and their children who are 36 months old and 64 years old.  The patient is [place]    ADVANCED DIRECTIVES: Not in place.  The patient tells me she would intend to name her daughter Jyl Or as her healthcare power of attorney   HEALTH MAINTENANCE: Social History   Tobacco Use   Smoking status: Never   Smokeless tobacco: Never  Vaping Use   Vaping status: Never Used  Substance Use Topics   Alcohol use: Yes    Alcohol/week: 1.0 standard drink of alcohol    Types: 1 Glasses of wine per week   Drug use: Never     Colonoscopy: never done  PAP: 2017 (performed in [place]  [place])  Bone density:    Allergies  Allergen Reactions   Sulfa Antibiotics Itching    Current Outpatient Medications  Medication Sig Dispense Refill   cetirizine  (ZYRTEC ) 10 MG tablet Take 1 tablet (10 mg total) by mouth daily. 30 tablet 2   anastrozole  (ARIMIDEX ) 1 MG tablet Take 1 tablet (1 mg total) by mouth daily. 90 tablet 4   atorvastatin  (LIPITOR) 40 MG tablet Take 1 tablet by mouth once daily 90 tablet 0   benzonatate  (TESSALON  PERLES) 100 MG capsule Take 1 capsule (100 mg total) by mouth 3 (three) times daily as needed for cough. (Patient not taking: Reported on 08/13/2023) 30 capsule 1   Blood Glucose Monitoring Suppl (ACCU-CHEK GUIDE ME) w/Device KIT  USE 1  TO CHECK GLUCOSE 4 TIMES DAILY BEFORE  MEALS  AND  AT  BEDTIME 1 kit 0   calcium -vitamin D (OSCAL WITH D) 500-5 MG-MCG tablet Take 1 tablet by mouth daily.     diclofenac  (VOLTAREN ) 75 MG EC tablet Take 75 mg by mouth 2 (two) times daily.     docusate sodium  (COLACE) 100 MG capsule Take 1 capsule (100 mg total) by mouth 2 (two) times daily. (Patient not taking: Reported on 08/13/2023) 10 capsule 0   doxycycline  (VIBRAMYCIN ) 100 MG capsule Take 1 capsule (100 mg total) by mouth 2 (two) times daily. (Patient not taking: Reported on 08/13/2023) 20 capsule 0   glucose blood (TRUE METRIX BLOOD GLUCOSE TEST) test strip 1 each by Other route 4 (four) times daily -  before meals and at bedtime. Use as instructed 100 each 12   lansoprazole  (PREVACID ) 30 MG capsule Take 1 capsule by mouth in the morning 90 capsule 0   Magnesium  100 MG CAPS Take 100 mg by mouth daily.     metFORMIN  (GLUCOPHAGE ) 1000 MG tablet Take 1 tablet (1,000 mg total) by mouth 2 (two) times daily with a meal. 180 tablet 0   metFORMIN  (GLUCOPHAGE ) 500 MG tablet Take 1 tablet (500 mg total) by mouth daily with breakfast. 90 tablet 0   methocarbamol  (ROBAXIN ) 500 MG tablet Take 1 tablet (500 mg total) by mouth every 6 (six) hours as needed for muscle spasms. (Patient not taking: Reported on 08/13/2023) 20 tablet 2   Multiple Vitamins-Minerals (CENTRUM SILVER 50+WOMEN PO) Take 1 capsule by mouth daily.     ondansetron  (ZOFRAN ) 4 MG tablet Take 1 tablet (4 mg total) by mouth every 6 (six) hours as needed for nausea. (Patient not taking: Reported on 08/13/2023) 20 tablet 0   oxyCODONE  (OXY IR/ROXICODONE ) 5 MG immediate release tablet Take 1-2 tablets (5-10 mg total) by mouth 3 (three) times daily as needed for moderate pain (pain score 4-6) (pain score 4-6). (Patient not taking: Reported on 08/13/2023) 36 tablet 0   rivaroxaban  (XARELTO ) 10 MG TABS tablet Take 1 tablet (10 mg total) by mouth daily with breakfast. To be taken after surgery to prevent  blood clots (Patient not taking: Reported on 05/01/2023) 30 tablet 0   TRUEplus Lancets 28G MISC 1 each by Other route 4 (four) times daily -  before meals and at bedtime. Use as directed 100 each 4   UNABLE TO FIND Take 1 Scoop by mouth daily. Med Name: Manson Seitz     valsartan  (DIOVAN ) 40 MG tablet Take 1 tablet (40 mg total) by mouth daily. 90 tablet 3   vitamin C (ASCORBIC ACID) 500 MG tablet Take 500 mg by mouth daily.     No current facility-administered  medications for this visit.    OBJECTIVE: African-American woman in no acute distress  Vitals:   10/13/23 1337  BP: 131/75  Pulse: 74  Resp: 17  Temp: 98.4 F (36.9 C)  SpO2: 100%         Body mass index is 33.73 kg/m.   Wt Readings from Last 3 Encounters:  10/13/23 228 lb 6.4 oz (103.6 kg)  08/13/23 221 lb 3.2 oz (100.3 kg)  06/10/23 220 lb 6.4 oz (100 kg)     ECOG FS:1 - Symptomatic but completely ambulatory  Physical Exam Constitutional:      Appearance: Normal appearance.  Cardiovascular:     Rate and Rhythm: Normal rate and regular rhythm.  Chest:     Comments: Right breast s.p surgical changes. No masses or regional adenopathy Musculoskeletal:     Cervical back: Normal range of motion and neck supple. No rigidity.  Lymphadenopathy:     Cervical: No cervical adenopathy.  Neurological:     Mental Status: She is alert.      LAB RESULTS:  CMP     Component Value Date/Time   NA 141 08/13/2023 0936   K 5.3 (H) 08/13/2023 0936   CL 103 08/13/2023 0936   CO2 23 08/13/2023 0936   GLUCOSE 92 08/13/2023 0936   GLUCOSE 109 (H) 04/03/2023 1430   BUN 11 08/13/2023 0936   CREATININE 0.70 08/13/2023 0936   CREATININE 0.79 10/16/2022 1532   CREATININE 0.75 07/21/2022 0820   CALCIUM  9.7 08/13/2023 0936   PROT 6.5 10/16/2022 1532   ALBUMIN 4.0 10/16/2022 1532   AST 25 10/16/2022 1532   ALT 22 10/16/2022 1532   ALKPHOS 92 10/16/2022 1532   BILITOT 0.3 10/16/2022 1532   GFRNONAA >60 04/03/2023 1430    GFRNONAA >60 10/16/2022 1532    No results found for: "TOTALPROTELP", "ALBUMINELP", "A1GS", "A2GS", "BETS", "BETA2SER", "GAMS", "MSPIKE", "SPEI"  Lab Results  Component Value Date   WBC 8.2 04/14/2023   NEUTROABS 2.0 10/16/2022   HGB 9.6 (L) 04/14/2023   HCT 30.4 (L) 04/14/2023   MCV 83.5 04/14/2023   PLT 304 04/14/2023    No results found for: "LABCA2"  No components found for: "NGEXBM841"  No results for input(s): "INR" in the last 168 hours.  No results found for: "LABCA2"  No results found for: "LKG401"  No results found for: "CAN125"  No results found for: "CAN153"  No results found for: "CA2729"  No components found for: "HGQUANT"  No results found for: "CEA1", "CEA" / No results found for: "CEA1", "CEA"   No results found for: "AFPTUMOR"  No results found for: "CHROMOGRNA"  No results found for: "KPAFRELGTCHN", "LAMBDASER", "KAPLAMBRATIO" (kappa/lambda light chains)  No results found for: "HGBA", "HGBA2QUANT", "HGBFQUANT", "HGBSQUAN" (Hemoglobinopathy evaluation)   No results found for: "LDH"  No results found for: "IRON", "TIBC", "IRONPCTSAT" (Iron and TIBC)  No results found for: "FERRITIN"  Urinalysis    Component Value Date/Time   COLORURINE YELLOW 04/24/2022 0254   APPEARANCEUR CLEAR 04/24/2022 0254   LABSPEC 1.011 04/24/2022 0254   PHURINE 5.0 04/24/2022 0254   GLUCOSEU NEGATIVE 04/24/2022 0254   HGBUR NEGATIVE 04/24/2022 0254   BILIRUBINUR NEGATIVE 04/24/2022 0254   KETONESUR NEGATIVE 04/24/2022 0254   PROTEINUR NEGATIVE 04/24/2022 0254   NITRITE NEGATIVE 04/24/2022 0254   LEUKOCYTESUR NEGATIVE 04/24/2022 0254     STUDIES: No results found.    ELIGIBLE FOR AVAILABLE RESEARCH PROTOCOL: AET  ASSESSMENT: 68 y.o. [place] woman status post right breast upper outer  quadrant (12:00) and axillary lymph node biopsy 02/03/2020, both positive for a clinical T1c N1, stage IIB invasive ductal carcinoma, grade 3, estrogen receptor  positive, HER-2 and progesterone receptor negative, with an MIB-1 of 60%.  (a) biopsy of a second right upper outer quadrant T2 lesion (11 o'clock) 02/06/2020 also showed invasive ductal carcinoma, but grade 2, estrogen receptor positive, progesterone receptor and HER-2 negative, with an MIB-1 of 15%  (b) biopsy of a third right breast lesion (1:00) was benign   (1) neoadjuvant chemotherapy consisting of cyclophosphamide  and docetaxel  every 21 days x 4 starting 03/05/2020  (a) discontinued after 2 cycles because of peripheral neuropathy  (b) cyclophosphamide /doxorubicin  started 04/24/2020, repeated every 21 days x4, completed 06/25/2020  (c) echo 04/17/2020 showed EF of 55-60%  (2) right lumpectomy 07/26/2020 found a residual pT1c pN1 invasive ductal carcinoma, grade 2, with negative margins  (a) repeat prognostic indicators show the tumor to be estrogen receptor positive, progesterone receptor and HER2 negative, with an MIB-1 of 2%.  (b) a total of 6 right axillary lymph nodes removed, 2 positive  (3) adjuvant radiation completed 10/17/2020  (4) anastrozole  started June 2022  (5) CT scan of the chest 04/23/2020 shows a left brachiocephalicl catheter associated clot  (a) rivaroxaban  started 04/24/2020, discontinued 08/22/2020  (b) port removed 07/26/2020  (6) Started abemaciclib  in March 2023, took it till Dec 2023, stopped it since then.  Last MRI breast Jan 2023 showed no evidence of breast malignancy. Post surgical collection right breast near right nipple which could have been causing the drainage.  Mammogram in Nov with stable lumpectomy changes, recommendation was to repeat mammogram in 1 year.  Plan  Assessment and Plan Assessment & Plan Breast Cancer Breast cancer in remission. Recent mammogram in December showed no concerning findings. She expressed concern about recurrence and interest in blood test monitoring, guardant reveal. - Continue anastrozole  for breast cancer  management. - Perform Guardant reveal for MRD testing  Hypertension Hypertension well-managed with valsartan  40 mg. - Continue valsartan  40 mg for hypertension management.  Type 2 Diabetes Mellitus Type 2 Diabetes Mellitus managed with metformin . Dosage reduced to 500 mg once daily. Focus on weight loss and exercise. - Continue metformin  500 mg once daily. - Encourage weight loss and regular exercise.  Time spent: 20 min  *Total Encounter Time as defined by the Centers for Medicare and Medicaid Services includes, in addition to the face-to-face time of a patient visit (documented in the note above) non-face-to-face time: obtaining and reviewing outside history, ordering and reviewing medications, tests or procedures, care coordination (communications with other health care professionals or caregivers) and documentation in the medical record.

## 2023-10-13 NOTE — Assessment & Plan Note (Signed)
 [name] [name] is a 68 year old woman with h/o stage IIB ER/PR positive breast cancer s/p neoadjuvant chemotherapy, lumpectomy, adjuvant radiation therapy, and began Anastrozole  in 10/2020.   [name] [name] will continue on Anastrozole  daily.  Her lungs sound good today.

## 2023-10-27 ENCOUNTER — Telehealth: Payer: Self-pay

## 2023-10-27 NOTE — Telephone Encounter (Signed)
 Called and spoke with patient's daughter Jyl Or to let her know that per Dr. Arno Bibles: patient's Guardant Reveal test was negative. [name] verbalized appreciation of call and stated that she would update her mother. Denied any other needs at this time.

## 2023-10-28 ENCOUNTER — Encounter: Payer: Self-pay | Admitting: Hematology and Oncology

## 2023-11-02 ENCOUNTER — Ambulatory Visit: Admitting: Orthopedic Surgery

## 2023-11-02 ENCOUNTER — Encounter: Payer: Self-pay | Admitting: Orthopedic Surgery

## 2023-11-02 DIAGNOSIS — M722 Plantar fascial fibromatosis: Secondary | ICD-10-CM | POA: Diagnosis not present

## 2023-11-02 DIAGNOSIS — L600 Ingrowing nail: Secondary | ICD-10-CM

## 2023-11-02 NOTE — Progress Notes (Signed)
 Office Visit Note   [name]           Date of Birth: 03/24/1956           MRN: [id number] Visit Date: 11/02/2023              Requested by: Lorren Greig PARAS, NP [address] [place],  [place] [address] PCP: Lorren Greig PARAS, NP  Chief Complaint  Patient presents with   Right Foot - Pain   Left Foot - Pain      HPI: Patient is a 68 year old woman who is seen for initial evaluation for 2 separate issues.  Patient states that she has had ingrown toenails bilaterally and has had the nails removed she was concerned that she may have an infection.  #2 patient has bilateral plantar fasciitis.  Currently ambulating in flip-flops.  Assessment & Plan: Visit Diagnoses:  1. Plantar fasciitis, bilateral     Plan: Discussed that she does have ingrown nails but no paronychial infection at this time would plan for great toenail excision if she develops a paronychial infection.  Recommended a new balance walking sneaker for her plantar fasciitis as well as Voltaren  gel 3 times a day.  Follow-Up Instructions: Return if symptoms worsen or fail to improve.   Ortho Exam  Patient is alert, oriented, no adenopathy, well-dressed, normal affect, normal respiratory effort. Examination patient has palpable pulses bilaterally.  She has dorsiflexion just past neutral of the ankle bilaterally.  She is tender to palpation over the mid substance of the plantar fascia bilaterally with no plantar fascial fibromatosis.  Lamination of the great toenails she has thickened discolored onychomycotic nails with ingrown toenails bilaterally of the great toenail without paronychial infection.    Imaging: No results found. No images are attached to the encounter.  Labs: Lab Results  Component Value Date   HGBA1C 6.7 (H) 08/13/2023   HGBA1C 6.2 (H) 04/03/2023   HGBA1C 6.1 01/06/2023   REPTSTATUS 04/25/2022 FINAL 04/24/2022   REPTSTATUS 04/28/2022 FINAL 04/24/2022   GRAMSTAIN  04/24/2022     RARE WBC PRESENT, PREDOMINANTLY PMN RARE GRAM NEGATIVE RODS Performed at [facility], [address], [place], [place] [address]    CULT  04/24/2022    RARE ENTEROBACTER CLOACAE RARE PSEUDOMONAS AERUGINOSA RARE PSEUDOMONAS FLUORESCENS    LABORGA PSEUDOMONAS AERUGINOSA 04/24/2022   LABORGA ENTEROBACTER CLOACAE 04/24/2022   LABORGA PSEUDOMONAS FLUORESCENS 04/24/2022     Lab Results  Component Value Date   ALBUMIN 4.0 10/16/2022   ALBUMIN 3.9 06/12/2022   ALBUMIN 2.5 (L) 04/26/2022    Lab Results  Component Value Date   MG 2.7 (H) 04/25/2022   MG 1.1 (L) 04/24/2022   No results found for: VD25OH  No results found for: PREALBUMIN    Latest Ref Rng & Units 04/14/2023    9:44 AM 04/03/2023    2:30 PM 10/16/2022    3:32 PM  CBC EXTENDED  WBC 4.0 - 10.5 K/uL 8.2  4.5  3.1   RBC 3.87 - 5.11 MIL/uL 3.64  3.69  3.30   Hemoglobin 12.0 - 15.0 g/dL 9.6  9.8  89.9   HCT 63.9 - 46.0 % 30.4  31.4  30.9   Platelets 150 - 400 K/uL 304  266  192   NEUT# 1.7 - 7.7 K/uL   2.0   Lymph# 0.7 - 4.0 K/uL   0.8      There is no height or weight on file to calculate  BMI.  Orders:  No orders of the defined types were placed in this encounter.  No orders of the defined types were placed in this encounter.    Procedures: No procedures performed  Clinical Data: No additional findings.  ROS:  All other systems negative, except as noted in the HPI. Review of Systems  Objective: Vital Signs: There were no vitals taken for this visit.  Specialty Comments:  MRI LUMBAR SPINE WITHOUT CONTRAST   TECHNIQUE: Multiplanar, multisequence MR imaging of the lumbar spine was performed. No intravenous contrast was administered.   COMPARISON:  None available.   FINDINGS: Segmentation: Standard. Lowest well-formed disc space labeled the L5-S1 level.   Alignment: Underlying mild sigmoid scoliosis. 2 mm retrolisthesis of T11 on T12. 4 mm anterolisthesis of L3 on L4, with 5  mm anterolisthesis of L4 on L5. Findings chronic and facet mediated.   Vertebrae: Vertebral body height maintained without acute or chronic fracture. Bone marrow signal intensity within normal limits. No discrete or worrisome osseous lesions. No abnormal marrow edema.   Conus medullaris and cauda equina: Conus extends to the L1 level. Conus and cauda equina appear normal.   Paraspinal and other soft tissues: Paraspinous soft tissues within normal limits. Probable fibroid uterus partially visualized. Visualized visceral structures otherwise unremarkable.   Disc levels:   T11-12: Seen only on sagittal projection. 2 mm retrolisthesis. Degenerative intervertebral disc space narrowing with diffuse disc bulge and disc desiccation. Mild bilateral facet hypertrophy. No significant spinal stenosis. Mild right foraminal narrowing. Left neural foramina remains patent.   T12-L1: Unremarkable.   L1-2: Normal interspace. Mild bilateral facet hypertrophy. No canal or foraminal stenosis.   L2-3: Moderate degenerative intervertebral disc space narrowing with diffuse disc bulge and disc desiccation. Associated reactive endplate spurring. Superimposed small left subarticular disc protrusion with inferior migration (series 3, image 9). Additional right foraminal to extraforaminal disc protrusion with slight superior migration present at this level as well (series 6, image 16). Mild to moderate facet and ligament flavum hypertrophy. Resultant mild canal with left lateral recess stenosis. Moderate right with mild to moderate left L2 foraminal narrowing.   L3-4: 4 mm anterolisthesis. Degenerative intervertebral disc space narrowing with diffuse disc bulge, asymmetric to the left. Associated discogenic reactive endplate change with marginal endplate osteophytic spurring. Moderate left worse than right facet arthrosis. Resultant moderate canal and right lateral recess stenosis, with more severe  narrowing of the left lateral recess. Moderate right with severe left L3 foraminal stenosis.   L4-5: 5 mm anterolisthesis. Mild diffuse disc bulge with disc desiccation, asymmetric to the left. Moderate facet and ligament flavum hypertrophy. Resultant moderate to severe canal with bilateral subarticular stenosis, worse on the left. Moderate bilateral L4 foraminal narrowing.   L5-S1: Degenerative intervertebral disc space narrowing with diffuse disc bulge and disc desiccation. Reactive endplate spurring. Superimposed broad-based left subarticular disc protrusion closely approximates and/or contacts the descending left S1 nerve root (series 6, image 34). Mild to moderate bilateral facet hypertrophy. Resultant mild narrowing of the left lateral recess. Central canal remains patent. Moderate left worse than right L5 foraminal stenosis.   IMPRESSION: 1. Multifactorial degenerative changes at L3-4 and L4-5 with resultant moderate to severe canal and bilateral subarticular stenosis, with moderate to severe bilateral L3 and L4 foraminal narrowing as above. 2. Broad-based left subarticular disc protrusion at L5-S1, potentially affecting the descending left S1 nerve root. 3. Right foraminal to extraforaminal disc protrusion at L2-3 with resultant moderate right L2 foraminal stenosis. 4. Additional small left subarticular disc  protrusion at L2-3, potentially affecting the descending left L3 nerve root.     Electronically Signed   By: Morene Hoard M.D.   On: 07/16/2021 04:08  PMFS History: Patient Active Problem List   Diagnosis Date Noted   Aortic valve sclerosis 06/10/2023   Status post total right knee replacement 04/13/2023   Hyperlipidemia associated with type 2 diabetes mellitus (HCC) 03/09/2023   Primary hypertension 03/09/2023   Heart murmur 03/09/2023   Orthostatic hypotension 03/06/2023   Sepsis (HCC) 04/24/2022   Primary osteoarthritis of right knee 02/11/2022    Status post total left knee replacement 11/18/2021   Primary osteoarthritis of left knee 08/06/2021   Aortic atherosclerosis (HCC) 04/24/2020   Deep venous thrombosis of left upper extremity (HCC) 04/24/2020   Port-A-Cath in place 03/26/2020   Malignant neoplasm of upper-outer quadrant of right breast in female, estrogen receptor positive (HCC) 02/13/2020   Past Medical History:  Diagnosis Date   Arthritis    Breast cancer (HCC)    right   Diabetes mellitus without complication (HCC)    GERD (gastroesophageal reflux disease)    Headache    sometimes   History of MRI    Knee, Hip, and Back.   Hypertension    Personal history of chemotherapy    Personal history of radiation therapy     Family History  Problem Relation Age of Onset   Asthma Mother    Arthritis Sister    Asthma Brother    Arthritis Brother     Past Surgical History:  Procedure Laterality Date   BREAST BIOPSY Right 02/03/2020   x2   BREAST BIOPSY Right 02/06/2020   x2   BREAST LUMPECTOMY Right 07/26/2020   BREAST LUMPECTOMY WITH RADIOACTIVE SEED AND SENTINEL LYMPH NODE BIOPSY Right 07/26/2020   Procedure: RIGHT BREAST LUMPECTOMY WITH RADIOACTIVE SEED X 2 AND SENTINEL LYMPH NODE BIOPSY;  Surgeon: Vanderbilt Ned, MD;  Location: [facility];  Service: General;  Laterality: Right;   IR IMAGING GUIDED PORT INSERTION  02/23/2020   PORT-A-CATH REMOVAL N/A 07/26/2020   Procedure: REMOVAL PORT-A-CATH;  Surgeon: Vanderbilt Ned, MD;  Location: [facility];  Service: General;  Laterality: N/A;   TOTAL KNEE ARTHROPLASTY Left 11/18/2021   Procedure: LEFT TOTAL KNEE ARTHROPLASTY;  Surgeon: Jerri Kay HERO, MD;  Location: [facility];  Service: Orthopedics;  Laterality: Left;   TOTAL KNEE ARTHROPLASTY Right 04/13/2023   Procedure: RIGHT TOTAL KNEE ARTHROPLASTY;  Surgeon: Jerri Kay HERO, MD;  Location: [facility];  Service: Orthopedics;  Laterality: Right;   Social History   Occupational History   Not on file   Tobacco Use   Smoking status: Never   Smokeless tobacco: Never  Vaping Use   Vaping status: Never Used  Substance and Sexual Activity   Alcohol use: Yes    Alcohol/week: 1.0 standard drink of alcohol    Types: 1 Glasses of wine per week   Drug use: Never   Sexual activity: Not Currently

## 2023-11-03 LAB — GUARDANT REVEAL

## 2023-11-09 ENCOUNTER — Other Ambulatory Visit: Payer: Self-pay | Admitting: Family

## 2023-11-09 DIAGNOSIS — E1165 Type 2 diabetes mellitus with hyperglycemia: Secondary | ICD-10-CM

## 2023-11-09 NOTE — Telephone Encounter (Signed)
 Complete

## 2023-11-12 ENCOUNTER — Encounter: Payer: Self-pay | Admitting: Family

## 2023-11-12 ENCOUNTER — Ambulatory Visit (INDEPENDENT_AMBULATORY_CARE_PROVIDER_SITE_OTHER): Admitting: Family

## 2023-11-12 VITALS — BP 113/74 | HR 63 | Wt 222.8 lb

## 2023-11-12 DIAGNOSIS — Z7984 Long term (current) use of oral hypoglycemic drugs: Secondary | ICD-10-CM

## 2023-11-12 DIAGNOSIS — E1165 Type 2 diabetes mellitus with hyperglycemia: Secondary | ICD-10-CM

## 2023-11-12 DIAGNOSIS — E785 Hyperlipidemia, unspecified: Secondary | ICD-10-CM

## 2023-11-12 DIAGNOSIS — K219 Gastro-esophageal reflux disease without esophagitis: Secondary | ICD-10-CM | POA: Diagnosis not present

## 2023-11-12 LAB — POCT GLYCOSYLATED HEMOGLOBIN (HGB A1C): HbA1c, POC (controlled diabetic range): 6.2 % (ref 0.0–7.0)

## 2023-11-12 MED ORDER — OMEPRAZOLE 20 MG PO CPDR: 20.0000 mg | DELAYED_RELEASE_CAPSULE | Freq: Every day | ORAL | 0 refills | Status: AC

## 2023-11-12 MED ORDER — ATORVASTATIN CALCIUM 40 MG PO TABS: 40.0000 mg | ORAL_TABLET | Freq: Every day | ORAL | 0 refills | Status: DC

## 2023-11-12 MED ORDER — METFORMIN HCL 500 MG PO TABS: 500.0000 mg | ORAL_TABLET | Freq: Every day | ORAL | 0 refills | Status: DC

## 2023-11-12 NOTE — Progress Notes (Signed)
 Patient ID: [name] [name], female    DOB: 1955-09-13  MRN: [id number]  CC: Chronic Conditions Follow-Up  Subjective: [name] [name] is a 68 y.o. female who presents for chronic conditions follow-up.   Her concerns today include:  - Doing well on Metformin , no issues/concerns. Denies red flag symptoms associated with diabetes.  - Doing well on Atorvastatin , no issues/concerns.  - Would like to try a new acid reflux medication. States doesn't feel current acid reflux medication is helping as it did before. Denies red flag symptoms.  Patient Active Problem List   Diagnosis Date Noted   Aortic valve sclerosis 06/10/2023   Status post total right knee replacement 04/13/2023   Hyperlipidemia associated with type 2 diabetes mellitus (HCC) 03/09/2023   Primary hypertension 03/09/2023   Heart murmur 03/09/2023   Orthostatic hypotension 03/06/2023   Sepsis (HCC) 04/24/2022   Primary osteoarthritis of right knee 02/11/2022   Status post total left knee replacement 11/18/2021   Primary osteoarthritis of left knee 08/06/2021   Aortic atherosclerosis (HCC) 04/24/2020   Deep venous thrombosis of left upper extremity (HCC) 04/24/2020   Port-A-Cath in place 03/26/2020   Malignant neoplasm of upper-outer quadrant of right breast in female, estrogen receptor positive (HCC) 02/13/2020     Current Outpatient Medications on File Prior to Visit  Medication Sig Dispense Refill   anastrozole  (ARIMIDEX ) 1 MG tablet Take 1 tablet (1 mg total) by mouth daily. 90 tablet 4   benzonatate  (TESSALON  PERLES) 100 MG capsule Take 1 capsule (100 mg total) by mouth 3 (three) times daily as needed for cough. 30 capsule 1   Blood Glucose Monitoring Suppl (ACCU-CHEK GUIDE ME) w/Device KIT USE 1  TO CHECK GLUCOSE 4 TIMES DAILY BEFORE  MEALS  AND  AT  BEDTIME 1 kit 0   calcium -vitamin D (OSCAL WITH D) 500-5 MG-MCG tablet Take 1 tablet by mouth daily.     cetirizine  (ZYRTEC ) 10 MG tablet Take 1 tablet (10 mg total) by mouth  daily. 30 tablet 2   diclofenac  (VOLTAREN ) 75 MG EC tablet Take 75 mg by mouth 2 (two) times daily.     docusate sodium  (COLACE) 100 MG capsule Take 1 capsule (100 mg total) by mouth 2 (two) times daily. 10 capsule 0   glucose blood (TRUE METRIX BLOOD GLUCOSE TEST) test strip 1 each by Other route 4 (four) times daily -  before meals and at bedtime. Use as instructed 100 each 12   lansoprazole  (PREVACID ) 30 MG capsule Take 1 capsule by mouth in the morning 90 capsule 0   Magnesium  100 MG CAPS Take 100 mg by mouth daily.     methocarbamol  (ROBAXIN ) 500 MG tablet Take 1 tablet (500 mg total) by mouth every 6 (six) hours as needed for muscle spasms. 20 tablet 2   Multiple Vitamins-Minerals (CENTRUM SILVER 50+WOMEN PO) Take 1 capsule by mouth daily.     ondansetron  (ZOFRAN ) 4 MG tablet Take 1 tablet (4 mg total) by mouth every 6 (six) hours as needed for nausea. 20 tablet 0   TRUEplus Lancets 28G MISC 1 each by Other route 4 (four) times daily -  before meals and at bedtime. Use as directed 100 each 4   UNABLE TO FIND Take 1 Scoop by mouth daily. Med Name: Mervin Masters     valsartan  (DIOVAN ) 40 MG tablet Take 1 tablet (40 mg total) by mouth daily. 90 tablet 3   vitamin C (ASCORBIC ACID) 500 MG tablet Take 500 mg by mouth  daily.     doxycycline  (VIBRAMYCIN ) 100 MG capsule Take 1 capsule (100 mg total) by mouth 2 (two) times daily. (Patient not taking: Reported on 08/13/2023) 20 capsule 0   metFORMIN  (GLUCOPHAGE ) 1000 MG tablet Take 1 tablet (1,000 mg total) by mouth 2 (two) times daily with a meal. 180 tablet 0   oxyCODONE  (OXY IR/ROXICODONE ) 5 MG immediate release tablet Take 1-2 tablets (5-10 mg total) by mouth 3 (three) times daily as needed for moderate pain (pain score 4-6) (pain score 4-6). (Patient not taking: Reported on 08/13/2023) 36 tablet 0   rivaroxaban  (XARELTO ) 10 MG TABS tablet Take 1 tablet (10 mg total) by mouth daily with breakfast. To be taken after surgery to prevent blood clots (Patient not  taking: Reported on 05/01/2023) 30 tablet 0   No current facility-administered medications on file prior to visit.    Allergies  Allergen Reactions   Sulfa Antibiotics Itching    Social History   Socioeconomic History   Marital status: Single    Spouse name: Not on file   Number of children: 1   Years of education: Not on file   Highest education level: 9th grade  Occupational History   Not on file  Tobacco Use   Smoking status: Never   Smokeless tobacco: Never  Vaping Use   Vaping status: Never Used  Substance and Sexual Activity   Alcohol use: Yes    Alcohol/week: 1.0 standard drink of alcohol    Types: 1 Glasses of wine per week   Drug use: Never   Sexual activity: Not Currently  Other Topics Concern   Not on file  Social History Narrative   Tobacco use, amount per day now:   Past tobacco use, amount per day:   How many years did you use tobacco:   Alcohol use (drinks per week): 1 drink occasionally.   Diet:   Do you drink/eat things with caffeine: Yes   Marital status:  Single                                What year were you married?   Do you live in a house, apartment, assisted living, condo, trailer, etc.? House   Is it one or more stories? One   How many persons live in your home? 5   Do you have pets in your home?( please list) Cat   Highest Level of education completed: High School.   Current or past profession: Psychologist, sport and exercise.   Do you exercise?  Yes                                   Type and how often? 2 times weekly.   Do you have a living will? No   Do you have a DNR form?    No                               If not, do you want to discuss one?   Do you have signed POA/HPOA forms?     No                   If so, please bring to you appointment    Do you have any difficulty bathing or dressing yourself? No   Do you have  difficulty preparing food or eating? No   Do you have difficulty managing your medications? No   Do you have any difficulty managing  your finances? No   Do you have any difficulty affording your medications? No          Social Drivers of Corporate investment banker Strain: Low Risk  (11/12/2023)   Overall Financial Resource Strain (CARDIA)    Difficulty of Paying Living Expenses: Not hard at all  Food Insecurity: No Food Insecurity (11/12/2023)   Hunger Vital Sign    Worried About Running Out of Food in the Last Year: Never true    Ran Out of Food in the Last Year: Never true  Transportation Needs: No Transportation Needs (11/12/2023)   PRAPARE - Administrator, Civil Service (Medical): No    Lack of Transportation (Non-Medical): No  Physical Activity: Insufficiently Active (11/12/2023)   Exercise Vital Sign    Days of Exercise per Week: 4 days    Minutes of Exercise per Session: 30 min  Stress: No Stress Concern Present (11/12/2023)   [employer] of Occupational Health - Occupational Stress Questionnaire    Feeling of Stress: Not at all  Social Connections: Not on file  Intimate Partner Violence: Not At Risk (04/25/2022)   Humiliation, Afraid, Rape, and Kick questionnaire    Fear of Current or Ex-Partner: No    Emotionally Abused: No    Physically Abused: No    Sexually Abused: No    Family History  Problem Relation Age of Onset   Asthma Mother    Arthritis Sister    Asthma Brother    Arthritis Brother     Past Surgical History:  Procedure Laterality Date   BREAST BIOPSY Right 02/03/2020   x2   BREAST BIOPSY Right 02/06/2020   x2   BREAST LUMPECTOMY Right 07/26/2020   BREAST LUMPECTOMY WITH RADIOACTIVE SEED AND SENTINEL LYMPH NODE BIOPSY Right 07/26/2020   Procedure: RIGHT BREAST LUMPECTOMY WITH RADIOACTIVE SEED X 2 AND SENTINEL LYMPH NODE BIOPSY;  Surgeon: Vanderbilt Ned, MD;  Location: [facility];  Service: General;  Laterality: Right;   IR IMAGING GUIDED PORT INSERTION  02/23/2020   PORT-A-CATH REMOVAL N/A 07/26/2020   Procedure: REMOVAL PORT-A-CATH;  Surgeon:  Vanderbilt Ned, MD;  Location: [facility];  Service: General;  Laterality: N/A;   TOTAL KNEE ARTHROPLASTY Left 11/18/2021   Procedure: LEFT TOTAL KNEE ARTHROPLASTY;  Surgeon: Jerri Kay HERO, MD;  Location: [facility];  Service: Orthopedics;  Laterality: Left;   TOTAL KNEE ARTHROPLASTY Right 04/13/2023   Procedure: RIGHT TOTAL KNEE ARTHROPLASTY;  Surgeon: Jerri Kay HERO, MD;  Location: [facility];  Service: Orthopedics;  Laterality: Right;    ROS: Review of Systems Negative except as stated above  PHYSICAL EXAM: BP 113/74   Pulse 63   Wt 222 lb 12.8 oz (101.1 kg)   SpO2 96%   BMI 32.90 kg/m   Physical Exam HENT:     Head: Normocephalic and atraumatic.     Nose: Nose normal.     Mouth/Throat:     Mouth: Mucous membranes are moist.     Pharynx: Oropharynx is clear.  Eyes:     Extraocular Movements: Extraocular movements intact.     Conjunctiva/sclera: Conjunctivae normal.     Pupils: Pupils are equal, round, and reactive to light.  Cardiovascular:     Rate and Rhythm: Normal rate and regular rhythm.     Pulses: Normal pulses.  Heart sounds: Normal heart sounds.  Pulmonary:     Effort: Pulmonary effort is normal.     Breath sounds: Normal breath sounds.  Musculoskeletal:        General: Normal range of motion.     Cervical back: Normal range of motion and neck supple.  Neurological:     General: No focal deficit present.     Mental Status: She is alert and oriented to person, place, and time.  Psychiatric:        Mood and Affect: Mood normal.        Behavior: Behavior normal.    Results for orders placed or performed in visit on 11/12/23  POCT glycosylated hemoglobin (Hb A1C)  Result Value Ref Range   Hemoglobin A1C     HbA1c POC (<> result, manual entry)     HbA1c, POC (prediabetic range)     HbA1c, POC (controlled diabetic range) 6.2 0.0 - 7.0 %    ASSESSMENT AND PLAN: 1. Type 2 diabetes mellitus with hyperglycemia, without long-term current use of insulin   (HCC) (Primary) - Continue Metformin  as prescribed.  - Hemoglobin A1c at goal at 6.2%, goal 7%. - Routine screening.  - Discussed the importance of healthy eating habits, low-carbohydrate diet, low-sugar diet, regular aerobic exercise (at least 150 minutes a week as tolerated) and medication compliance to achieve or maintain control of diabetes. Counseled on medication adherence/adverse effects.  - Follow-up with primary provider as scheduled. - POCT glycosylated hemoglobin (Hb A1C) - metFORMIN  (GLUCOPHAGE ) 500 MG tablet; Take 1 tablet (500 mg total) by mouth daily with breakfast.  Dispense: 90 tablet; Refill: 0 - Microalbumin / creatinine urine ratio  2. Hyperlipidemia, unspecified hyperlipidemia type - Continue Atorvastatin  as prescribed. Counseled on medication adherence/adverse effects. - Routine screening.  - Follow-up with primary provider as scheduled. - Lipid panel - atorvastatin  (LIPITOR) 40 MG tablet; Take 1 tablet (40 mg total) by mouth daily.  Dispense: 90 tablet; Refill: 0  3. Gastroesophageal reflux disease, unspecified whether esophagitis present - Omeprazole as prescribed. Counseled on medication adherence/adverse effects. - Follow-up with primary provider in 4 weeks or sooner if needed. - omeprazole (PRILOSEC) 20 MG capsule; Take 1 capsule (20 mg total) by mouth daily.  Dispense: 90 capsule; Refill: 0    Patient was given the opportunity to ask questions.  Patient verbalized understanding of the plan and was able to repeat key elements of the plan. Patient was given clear instructions to go to Emergency Department or return to medical center if symptoms don't improve, worsen, or new problems develop.The patient verbalized understanding.   Orders Placed This Encounter  Procedures   Lipid panel   Microalbumin / creatinine urine ratio   POCT glycosylated hemoglobin (Hb A1C)     Requested Prescriptions   Signed Prescriptions Disp Refills   metFORMIN  (GLUCOPHAGE ) 500  MG tablet 90 tablet 0    Sig: Take 1 tablet (500 mg total) by mouth daily with breakfast.   atorvastatin  (LIPITOR) 40 MG tablet 90 tablet 0    Sig: Take 1 tablet (40 mg total) by mouth daily.    Return in about 3 months (around 02/12/2024) for Follow-Up or next available chronic conditions.  Greig JINNY Drones, NP

## 2023-11-14 LAB — LIPID PANEL
Chol/HDL Ratio: 1.9 ratio (ref 0.0–4.4)
Cholesterol, Total: 127 mg/dL (ref 100–199)
HDL: 66 mg/dL (ref >39)
LDL Chol Calc (NIH): 43 mg/dL (ref 0–99)
Triglycerides: 98 mg/dL (ref 0–149)
VLDL Cholesterol Cal: 18 mg/dL (ref 5–40)

## 2023-11-14 LAB — MICROALBUMIN / CREATININE URINE RATIO
Creatinine, Urine: 197.7 mg/dL
Microalb/Creat Ratio: 5 mg/g{creat} (ref 0–29)
Microalbumin, Urine: 10.1 ug/mL

## 2023-11-16 ENCOUNTER — Ambulatory Visit: Payer: Self-pay | Admitting: Family

## 2023-12-01 ENCOUNTER — Ambulatory Visit: Admitting: Podiatry

## 2024-01-08 ENCOUNTER — Ambulatory Visit (INDEPENDENT_AMBULATORY_CARE_PROVIDER_SITE_OTHER): Admitting: Family

## 2024-01-08 ENCOUNTER — Ambulatory Visit (INDEPENDENT_AMBULATORY_CARE_PROVIDER_SITE_OTHER)

## 2024-01-08 ENCOUNTER — Encounter: Payer: Self-pay | Admitting: Family

## 2024-01-08 VITALS — BP 122/82 | HR 79 | Temp 98.4°F | Resp 16 | Ht 69.0 in | Wt 211.6 lb

## 2024-01-08 DIAGNOSIS — F419 Anxiety disorder, unspecified: Secondary | ICD-10-CM

## 2024-01-08 DIAGNOSIS — M25569 Pain in unspecified knee: Secondary | ICD-10-CM

## 2024-01-08 DIAGNOSIS — R0600 Dyspnea, unspecified: Secondary | ICD-10-CM

## 2024-01-08 DIAGNOSIS — R399 Unspecified symptoms and signs involving the genitourinary system: Secondary | ICD-10-CM

## 2024-01-08 DIAGNOSIS — I10 Essential (primary) hypertension: Secondary | ICD-10-CM | POA: Diagnosis not present

## 2024-01-08 DIAGNOSIS — F32A Depression, unspecified: Secondary | ICD-10-CM

## 2024-01-08 LAB — POCT RAPID STREP A (OFFICE): Rapid Strep A Screen: NEGATIVE

## 2024-01-08 MED ORDER — DICLOFENAC SODIUM 50 MG PO TBEC: 50.0000 mg | DELAYED_RELEASE_TABLET | Freq: Two times a day (BID) | ORAL | 0 refills | Status: DC | PRN

## 2024-01-08 NOTE — Progress Notes (Signed)
 Shortness of breathe when walking, b/p dropping when taking b/p medication so patient is not taking anymore, patient not urinating when she went home to visit however she is urinating ok now that she is back here

## 2024-01-08 NOTE — Progress Notes (Signed)
 Patient ID: [name] [name], female    DOB: April 26, 1956  MRN: [id number]  CC: Chronic Conditions Follow-Up  Subjective: [name] [name] is a 68 y.o. female who presents for chronic conditions follow-up.   Her concerns today include:  - States she quit taking her blood pressure medication 2 weeks ago because it was causing her blood pressure to become too low. Reports since then blood pressure has been normal. She does not complain of red flag symptoms such as but not limited to chest pain, shortness of breath, worst headache of life, nausea/vomiting.  - Reports intermittent shortness of breath with walking. Denies additional symptoms.  - States she is no longer having urinary issues. - Anxiety depression. She denies thoughts of self-harm, suicidal ideations, homicidal ideations. - Doing well on Diclofenac , no issues/concerns. States she prefers Diclofenac  50 mg.   Patient Active Problem List   Diagnosis Date Noted   Aortic valve sclerosis 06/10/2023   Status post total right knee replacement 04/13/2023   Hyperlipidemia associated with type 2 diabetes mellitus (HCC) 03/09/2023   Primary hypertension 03/09/2023   Heart murmur 03/09/2023   Orthostatic hypotension 03/06/2023   Sepsis (HCC) 04/24/2022   Primary osteoarthritis of right knee 02/11/2022   Status post total left knee replacement 11/18/2021   Primary osteoarthritis of left knee 08/06/2021   Aortic atherosclerosis (HCC) 04/24/2020   Deep venous thrombosis of left upper extremity (HCC) 04/24/2020   Port-A-Cath in place 03/26/2020   Malignant neoplasm of upper-outer quadrant of right breast in female, estrogen receptor positive (HCC) 02/13/2020     Current Outpatient Medications on File Prior to Visit  Medication Sig Dispense Refill   anastrozole  (ARIMIDEX ) 1 MG tablet Take 1 tablet (1 mg total) by mouth daily. 90 tablet 4   atorvastatin  (LIPITOR) 40 MG tablet Take 1 tablet (40 mg total) by mouth daily. 90 tablet 0   Blood Glucose  Monitoring Suppl (ACCU-CHEK GUIDE ME) w/Device KIT USE 1  TO CHECK GLUCOSE 4 TIMES DAILY BEFORE  MEALS  AND  AT  BEDTIME 1 kit 0   calcium -vitamin D (OSCAL WITH D) 500-5 MG-MCG tablet Take 1 tablet by mouth daily.     cetirizine  (ZYRTEC ) 10 MG tablet Take 1 tablet (10 mg total) by mouth daily. (Patient taking differently: Take 10 mg by mouth daily. Patient taking as needed) 30 tablet 2   diclofenac  (VOLTAREN ) 75 MG EC tablet Take 75 mg by mouth 2 (two) times daily.     glucose blood (TRUE METRIX BLOOD GLUCOSE TEST) test strip 1 each by Other route 4 (four) times daily -  before meals and at bedtime. Use as instructed 100 each 12   lansoprazole  (PREVACID ) 30 MG capsule Take 1 capsule by mouth in the morning 90 capsule 0   Magnesium  100 MG CAPS Take 100 mg by mouth daily.     metFORMIN  (GLUCOPHAGE ) 500 MG tablet Take 1 tablet (500 mg total) by mouth daily with breakfast. 90 tablet 0   Multiple Vitamins-Minerals (CENTRUM SILVER 50+WOMEN PO) Take 1 capsule by mouth daily.     omeprazole  (PRILOSEC) 20 MG capsule Take 1 capsule (20 mg total) by mouth daily. (Patient taking differently: Take 20 mg by mouth daily. Patient takes sometimes however it does not work well) 90 capsule 0   TRUEplus Lancets 28G MISC 1 each by Other route 4 (four) times daily -  before meals and at bedtime. Use as directed 100 each 4   UNABLE TO FIND Take 1 Scoop by mouth  daily. Med Name: Mervin Masters     vitamin C (ASCORBIC ACID) 500 MG tablet Take 500 mg by mouth daily.     benzonatate  (TESSALON  PERLES) 100 MG capsule Take 1 capsule (100 mg total) by mouth 3 (three) times daily as needed for cough. (Patient not taking: Reported on 01/08/2024) 30 capsule 1   docusate sodium  (COLACE) 100 MG capsule Take 1 capsule (100 mg total) by mouth 2 (two) times daily. (Patient not taking: Reported on 01/08/2024) 10 capsule 0   doxycycline  (VIBRAMYCIN ) 100 MG capsule Take 1 capsule (100 mg total) by mouth 2 (two) times daily. (Patient not taking:  Reported on 08/13/2023) 20 capsule 0   metFORMIN  (GLUCOPHAGE ) 1000 MG tablet Take 1 tablet (1,000 mg total) by mouth 2 (two) times daily with a meal. 180 tablet 0   methocarbamol  (ROBAXIN ) 500 MG tablet Take 1 tablet (500 mg total) by mouth every 6 (six) hours as needed for muscle spasms. (Patient not taking: Reported on 01/08/2024) 20 tablet 2   ondansetron  (ZOFRAN ) 4 MG tablet Take 1 tablet (4 mg total) by mouth every 6 (six) hours as needed for nausea. (Patient not taking: Reported on 01/08/2024) 20 tablet 0   oxyCODONE  (OXY IR/ROXICODONE ) 5 MG immediate release tablet Take 1-2 tablets (5-10 mg total) by mouth 3 (three) times daily as needed for moderate pain (pain score 4-6) (pain score 4-6). (Patient not taking: Reported on 08/13/2023) 36 tablet 0   rivaroxaban  (XARELTO ) 10 MG TABS tablet Take 1 tablet (10 mg total) by mouth daily with breakfast. To be taken after surgery to prevent blood clots (Patient not taking: Reported on 05/01/2023) 30 tablet 0   valsartan  (DIOVAN ) 40 MG tablet Take 1 tablet (40 mg total) by mouth daily. (Patient not taking: Reported on 01/08/2024) 90 tablet 3   No current facility-administered medications on file prior to visit.    Allergies  Allergen Reactions   Sulfa Antibiotics Itching    Social History   Socioeconomic History   Marital status: Single    Spouse name: Not on file   Number of children: 1   Years of education: Not on file   Highest education level: 9th grade  Occupational History   Not on file  Tobacco Use   Smoking status: Never   Smokeless tobacco: Never  Vaping Use   Vaping status: Never Used  Substance and Sexual Activity   Alcohol use: Yes    Alcohol/week: 1.0 standard drink of alcohol    Types: 1 Glasses of wine per week   Drug use: Never   Sexual activity: Not Currently  Other Topics Concern   Not on file  Social History Narrative   Tobacco use, amount per day now:   Past tobacco use, amount per day:   How many years did you use  tobacco:   Alcohol use (drinks per week): 1 drink occasionally.   Diet:   Do you drink/eat things with caffeine: Yes   Marital status:  Single                                What year were you married?   Do you live in a house, apartment, assisted living, condo, trailer, etc.? House   Is it one or more stories? One   How many persons live in your home? 5   Do you have pets in your home?( please list) Cat   Highest Level of education  completed: High School.   Current or past profession: Psychologist, sport and exercise.   Do you exercise?  Yes                                   Type and how often? 2 times weekly.   Do you have a living will? No   Do you have a DNR form?    No                               If not, do you want to discuss one?   Do you have signed POA/HPOA forms?     No                   If so, please bring to you appointment    Do you have any difficulty bathing or dressing yourself? No   Do you have difficulty preparing food or eating? No   Do you have difficulty managing your medications? No   Do you have any difficulty managing your finances? No   Do you have any difficulty affording your medications? No          Social Drivers of Corporate investment banker Strain: Low Risk  (11/12/2023)   Overall Financial Resource Strain (CARDIA)    Difficulty of Paying Living Expenses: Not hard at all  Food Insecurity: No Food Insecurity (11/12/2023)   Hunger Vital Sign    Worried About Running Out of Food in the Last Year: Never true    Ran Out of Food in the Last Year: Never true  Transportation Needs: No Transportation Needs (11/12/2023)   PRAPARE - Administrator, Civil Service (Medical): No    Lack of Transportation (Non-Medical): No  Physical Activity: Insufficiently Active (11/12/2023)   Exercise Vital Sign    Days of Exercise per Week: 4 days    Minutes of Exercise per Session: 30 min  Stress: No Stress Concern Present (11/12/2023)   [employer] of Occupational Health -  Occupational Stress Questionnaire    Feeling of Stress: Not at all  Social Connections: Not on file  Intimate Partner Violence: Not At Risk (04/25/2022)   Humiliation, Afraid, Rape, and Kick questionnaire    Fear of Current or Ex-Partner: No    Emotionally Abused: No    Physically Abused: No    Sexually Abused: No    Family History  Problem Relation Age of Onset   Asthma Mother    Arthritis Sister    Asthma Brother    Arthritis Brother     Past Surgical History:  Procedure Laterality Date   BREAST BIOPSY Right 02/03/2020   x2   BREAST BIOPSY Right 02/06/2020   x2   BREAST LUMPECTOMY Right 07/26/2020   BREAST LUMPECTOMY WITH RADIOACTIVE SEED AND SENTINEL LYMPH NODE BIOPSY Right 07/26/2020   Procedure: RIGHT BREAST LUMPECTOMY WITH RADIOACTIVE SEED X 2 AND SENTINEL LYMPH NODE BIOPSY;  Surgeon: Vanderbilt Ned, MD;  Location: [facility];  Service: General;  Laterality: Right;   IR IMAGING GUIDED PORT INSERTION  02/23/2020   PORT-A-CATH REMOVAL N/A 07/26/2020   Procedure: REMOVAL PORT-A-CATH;  Surgeon: Vanderbilt Ned, MD;  Location: [facility];  Service: General;  Laterality: N/A;   TOTAL KNEE ARTHROPLASTY Left 11/18/2021   Procedure: LEFT TOTAL KNEE ARTHROPLASTY;  Surgeon: Jerri Kay HERO, MD;  Location:  [facility];  Service: Orthopedics;  Laterality: Left;   TOTAL KNEE ARTHROPLASTY Right 04/13/2023   Procedure: RIGHT TOTAL KNEE ARTHROPLASTY;  Surgeon: Jerri Kay HERO, MD;  Location: [facility];  Service: Orthopedics;  Laterality: Right;    ROS: Review of Systems Negative except as stated above  PHYSICAL EXAM: BP 122/82   Pulse 79   Temp 98.4 F (36.9 C) (Oral)   Resp 16   Ht 5' 9 (1.753 m)   Wt 211 lb 9.6 oz (96 kg)   SpO2 96%   BMI 31.25 kg/m   Physical Exam HENT:     Head: Normocephalic and atraumatic.     Nose: Nose normal.     Mouth/Throat:     Mouth: Mucous membranes are moist.     Pharynx: Oropharynx is clear.  Eyes:     Extraocular  Movements: Extraocular movements intact.     Conjunctiva/sclera: Conjunctivae normal.     Pupils: Pupils are equal, round, and reactive to light.  Cardiovascular:     Rate and Rhythm: Normal rate and regular rhythm.     Pulses: Normal pulses.     Heart sounds: Normal heart sounds.  Pulmonary:     Effort: Pulmonary effort is normal.     Breath sounds: Normal breath sounds.  Musculoskeletal:        General: Normal range of motion.     Cervical back: Normal range of motion and neck supple.     Right hip: Normal.     Left hip: Normal.     Right upper leg: Normal.     Left upper leg: Normal.     Right knee: Normal.     Left knee: Normal.     Right lower leg: Normal.     Left lower leg: Normal.     Right ankle: Normal.     Left ankle: Normal.     Right foot: Normal.     Left foot: Normal.  Neurological:     General: No focal deficit present.     Mental Status: She is alert and oriented to person, place, and time.  Psychiatric:        Mood and Affect: Mood normal.        Behavior: Behavior normal.     ASSESSMENT AND PLAN: 1. Primary hypertension (Primary) - Patient self-discontinued blood pressure medication due to states made her blood pressure too low.  - Blood pressure normal today in office.  - Counseled on blood pressure goal of less than 130/80, low-sodium, DASH diet, medication compliance, and 150 minutes of moderate intensity exercise per week as tolerated. Counseled on medication adherence and adverse effects. - Referral to Cardiology for evaluation/management.  - Follow-up with primary provider as scheduled.  - Ambulatory referral to Cardiology  2. Dyspnea, unspecified type - Patient today in office with no cardiopulmonary/acute distress.  - Diagnostic chest xray for evaluation. - Routine screening.  - Referral to Pulmonology for evaluation/management.  - Ambulatory referral to Pulmonology - COVID-19, Flu A+B and RSV - POCT rapid strep A; Future - Culture, Group  A Strep - DG Chest 2 View; Future  3. Lower urinary tract symptoms (LUTS) - Resolved.   4. Anxiety and depression - Patient denies thoughts of self-harm, suicidal ideations, homicidal ideations. - Patient declined pharmacological therapy.  - Patient declined referral to Psychiatry.  - Follow-up with primary provider as scheduled.   5. Knee pain, unspecified chronicity, unspecified laterality - Continue Diclofenac  as prescribed. Counseled on medication adherence/adverse effects.  -  Follow-up with primary provider as scheduled. - diclofenac  (VOLTAREN ) 50 MG EC tablet; Take 1 tablet (50 mg total) by mouth 2 (two) times daily as needed.  Dispense: 180 tablet; Refill: 0   Patient was given the opportunity to ask questions.  Patient verbalized understanding of the plan and was able to repeat key elements of the plan. Patient was given clear instructions to go to Emergency Department or return to medical center if symptoms don't improve, worsen, or new problems develop.The patient verbalized understanding.   Orders Placed This Encounter  Procedures   COVID-19, Flu A+B and RSV   Culture, Group A Strep   DG Chest 2 View   Ambulatory referral to Cardiology   Ambulatory referral to Pulmonology   POCT rapid strep A     Requested Prescriptions   Signed Prescriptions Disp Refills   diclofenac  (VOLTAREN ) 50 MG EC tablet 180 tablet 0    Sig: Take 1 tablet (50 mg total) by mouth 2 (two) times daily as needed.    Return for Follow-up as needed.  Greig JINNY Drones, NP

## 2024-01-09 LAB — COVID-19, FLU A+B AND RSV
Influenza A, NAA: NOT DETECTED
Influenza B, NAA: NOT DETECTED
RSV, NAA: NOT DETECTED
SARS-CoV-2, NAA: NOT DETECTED

## 2024-01-11 LAB — CULTURE, GROUP A STREP: Strep A Culture: NEGATIVE

## 2024-01-12 ENCOUNTER — Ambulatory Visit: Payer: Self-pay | Admitting: Family

## 2024-01-14 ENCOUNTER — Encounter: Payer: Self-pay | Admitting: Cardiology

## 2024-01-25 ENCOUNTER — Other Ambulatory Visit: Payer: Self-pay | Admitting: Family

## 2024-01-25 DIAGNOSIS — E1165 Type 2 diabetes mellitus with hyperglycemia: Secondary | ICD-10-CM

## 2024-01-27 ENCOUNTER — Telehealth: Payer: Self-pay

## 2024-01-27 ENCOUNTER — Encounter: Payer: Self-pay | Admitting: Pulmonary Disease

## 2024-01-27 ENCOUNTER — Ambulatory Visit (INDEPENDENT_AMBULATORY_CARE_PROVIDER_SITE_OTHER): Admitting: Pulmonary Disease

## 2024-01-27 ENCOUNTER — Other Ambulatory Visit (HOSPITAL_COMMUNITY): Payer: Self-pay

## 2024-01-27 DIAGNOSIS — R053 Chronic cough: Secondary | ICD-10-CM | POA: Diagnosis not present

## 2024-01-27 DIAGNOSIS — K219 Gastro-esophageal reflux disease without esophagitis: Secondary | ICD-10-CM

## 2024-01-27 MED ORDER — BUDESONIDE-FORMOTEROL FUMARATE 160-4.5 MCG/ACT IN AERO: 2.0000 | INHALATION_SPRAY | Freq: Two times a day (BID) | RESPIRATORY_TRACT | 6 refills | Status: AC

## 2024-01-27 MED ORDER — LANSOPRAZOLE 30 MG PO CPDR: 30.0000 mg | DELAYED_RELEASE_CAPSULE | Freq: Two times a day (BID) | ORAL | 1 refills | Status: AC

## 2024-01-27 NOTE — Telephone Encounter (Signed)
*  Pulm  Pharmacy Patient Advocate Encounter   Received notification from CoverMyMeds that prior authorization for Breyna  160-4.5MCG/ACT aerosol  is required/requested.   Insurance verification completed.   The patient is insured through [facility] .   Per test claim:  Brand Symbicort  is preferred by the insurance.  If suggested medication is appropriate, Please send in a new RX and discontinue this one. If not, please advise as to why it's not appropriate so that we may request a Prior Authorization. Please note, some preferred medications may still require a PA.  If the suggested medications have not been trialed and there are no contraindications to their use, the PA will not be submitted, as it will not be approved.

## 2024-01-29 ENCOUNTER — Encounter (HOSPITAL_BASED_OUTPATIENT_CLINIC_OR_DEPARTMENT_OTHER): Payer: Self-pay

## 2024-02-01 ENCOUNTER — Encounter: Payer: Self-pay | Admitting: Pulmonary Disease

## 2024-02-01 NOTE — Progress Notes (Signed)
 @Patient  ID: [name] [name], female    DOB: 29-Jul-1955, 68 y.o.   MRN: [id number]  Chief Complaint  Patient presents with   Consult    Persistent cough,     Referring provider: Lorren Greig PARAS, NP  HPI:   68 y.o. woman whom we are seeing for evaluation of dyspnea on exertion and cough.  Chief complaint is chronic cough.  Multiple PCP notes reviewed.  Patient chief complaint is cough.  Present for some months.  Dry.  Seems worse in the mornings and when she lying down.  No alleviating or exacerbating factors.  No position that makes things better or worse.  No seasonal or environmental factors she can identify to make things better or worse.  She endorses significant heartburn symptoms.  Despite taking PPI once a day.  She also has runny nose, postnasal drip symptoms.  They are been diagnosed with asthma.  But he does have a little bit of shortness of breath.  Usually inclines or stairs.  At rest okay and on flat surfaces usually okay.  Chest x-ray 12/2023 demonstrates clear lung and mild hyperinflation on my review interpretation.  Similar appearing lungs 02/2023 chest x-ray on my review and interpretation.  Questionaires / Pulmonary Flowsheets:   ACT:      No data to display          MMRC:     No data to display          Epworth:      No data to display          Tests:   FENO:  No results found for: NITRICOXIDE  PFT:     No data to display          WALK:      No data to display          Imaging: Personally reviewed and as per EMR in discussion this note DG Chest 2 View Result Date: 01/08/2024 CLINICAL DATA:  shortness of breath EXAM: CHEST - 2 VIEW COMPARISON:  None available. FINDINGS: No focal airspace consolidation, pleural effusion, or pneumothorax. Mild cardiomegaly. Tortuous aorta with aortic atherosclerosis. No acute fracture or destructive lesions. Multilevel thoracic osteophytosis. Surgical clips in the right breast and axilla. IMPRESSION:  No acute cardiopulmonary abnormality. Electronically Signed   By: Rogelia Myers M.D.   On: 01/08/2024 22:30    Lab Results: Personally reviewed CBC    Component Value Date/Time   WBC 8.2 04/14/2023 0944   RBC 3.64 (L) 04/14/2023 0944   HGB 9.6 (L) 04/14/2023 0944   HGB 10.0 (L) 10/16/2022 1532   HCT 30.4 (L) 04/14/2023 0944   PLT 304 04/14/2023 0944   PLT 192 10/16/2022 1532   MCV 83.5 04/14/2023 0944   MCH 26.4 04/14/2023 0944   MCHC 31.6 04/14/2023 0944   RDW 15.8 (H) 04/14/2023 0944   LYMPHSABS 0.8 10/16/2022 1532   MONOABS 0.2 10/16/2022 1532   EOSABS 0.1 10/16/2022 1532   BASOSABS 0.0 10/16/2022 1532    BMET    Component Value Date/Time   NA 141 08/13/2023 0936   K 5.3 (H) 08/13/2023 0936   CL 103 08/13/2023 0936   CO2 23 08/13/2023 0936   GLUCOSE 92 08/13/2023 0936   GLUCOSE 109 (H) 04/03/2023 1430   BUN 11 08/13/2023 0936   CREATININE 0.70 08/13/2023 0936   CREATININE 0.79 10/16/2022 1532   CREATININE 0.75 07/21/2022 0820   CALCIUM  9.7 08/13/2023 0936   GFRNONAA >60 04/03/2023 1430  GFRNONAA >60 10/16/2022 1532    BNP No results found for: BNP  ProBNP No results found for: PROBNP  Specialty Problems   None   Allergies  Allergen Reactions   Sulfa Antibiotics Itching    Immunization History  Administered Date(s) Administered    Astrazeneca Covid-19 Vaccine, Pf, 0.5 Ml Non Us   08/13/2019, 08/18/2019   DTaP 09/04/2017, 10/05/2017   Hepatitis B 09/04/2017, 10/05/2017   Influenza,inj,Quad PF,6+ Mos 04/26/2022   Influenza-Unspecified 09/04/2017, 04/19/2019   MMR 09/04/2017   Tdap 04/19/2019   Unspecified SARS-COV-2 Vaccination 09/13/2019, 11/06/2019   Varicella 09/04/2017, 06/30/2018    Past Medical History:  Diagnosis Date   Arthritis    Breast cancer (HCC)    right   Diabetes mellitus without complication (HCC)    GERD (gastroesophageal reflux disease)    Headache    sometimes   History of MRI    Knee, Hip, and Back.    Hypertension    Personal history of chemotherapy    Personal history of radiation therapy     Tobacco History: Social History   Tobacco Use  Smoking Status Never  Smokeless Tobacco Never   Counseling given: Not Answered   Continue to not smoke  Outpatient Encounter Medications as of 01/27/2024  Medication Sig   Accu-Chek Softclix Lancets lancets USE 1  TO CHECK GLUCOSE 4 TIMES DAILY BEFORE  MEALS  AND  AT  BEDTIME   anastrozole  (ARIMIDEX ) 1 MG tablet Take 1 tablet (1 mg total) by mouth daily.   atorvastatin  (LIPITOR) 40 MG tablet Take 1 tablet (40 mg total) by mouth daily.   Blood Glucose Monitoring Suppl (ACCU-CHEK GUIDE ME) w/Device KIT USE 1  TO CHECK GLUCOSE 4 TIMES DAILY BEFORE  MEALS  AND  AT  BEDTIME   budesonide -formoterol  (SYMBICORT ) 160-4.5 MCG/ACT inhaler Inhale 2 puffs into the lungs 2 (two) times daily.   calcium -vitamin D (OSCAL WITH D) 500-5 MG-MCG tablet Take 1 tablet by mouth daily.   cetirizine  (ZYRTEC ) 10 MG tablet Take 1 tablet (10 mg total) by mouth daily. (Patient taking differently: Take 10 mg by mouth daily. Patient taking as needed)   diclofenac  (VOLTAREN ) 50 MG EC tablet Take 1 tablet (50 mg total) by mouth 2 (two) times daily as needed.   diclofenac  (VOLTAREN ) 75 MG EC tablet Take 75 mg by mouth 2 (two) times daily.   glucose blood (TRUE METRIX BLOOD GLUCOSE TEST) test strip 1 each by Other route 4 (four) times daily -  before meals and at bedtime. Use as instructed   Magnesium  100 MG CAPS Take 100 mg by mouth daily.   metFORMIN  (GLUCOPHAGE ) 500 MG tablet Take 1 tablet (500 mg total) by mouth daily with breakfast.   Multiple Vitamins-Minerals (CENTRUM SILVER 50+WOMEN PO) Take 1 capsule by mouth daily.   omeprazole  (PRILOSEC) 20 MG capsule Take 1 capsule (20 mg total) by mouth daily. (Patient taking differently: Take 20 mg by mouth daily. Patient takes sometimes however it does not work well)   UNABLE TO FIND Take 1 Scoop by mouth daily. Med Name: Mervin Masters    vitamin C (ASCORBIC ACID) 500 MG tablet Take 500 mg by mouth daily.   [DISCONTINUED] lansoprazole  (PREVACID ) 30 MG capsule Take 1 capsule by mouth in the morning   benzonatate  (TESSALON  PERLES) 100 MG capsule Take 1 capsule (100 mg total) by mouth 3 (three) times daily as needed for cough. (Patient not taking: Reported on 01/27/2024)   docusate sodium  (COLACE) 100 MG capsule Take 1  capsule (100 mg total) by mouth 2 (two) times daily. (Patient not taking: Reported on 01/27/2024)   lansoprazole  (PREVACID ) 30 MG capsule Take 1 capsule (30 mg total) by mouth 2 (two) times daily before a meal.   methocarbamol  (ROBAXIN ) 500 MG tablet Take 1 tablet (500 mg total) by mouth every 6 (six) hours as needed for muscle spasms. (Patient not taking: Reported on 01/27/2024)   ondansetron  (ZOFRAN ) 4 MG tablet Take 1 tablet (4 mg total) by mouth every 6 (six) hours as needed for nausea. (Patient not taking: Reported on 01/27/2024)   rivaroxaban  (XARELTO ) 10 MG TABS tablet Take 1 tablet (10 mg total) by mouth daily with breakfast. To be taken after surgery to prevent blood clots (Patient not taking: Reported on 01/27/2024)   valsartan  (DIOVAN ) 40 MG tablet Take 1 tablet (40 mg total) by mouth daily. (Patient not taking: Reported on 01/27/2024)   [DISCONTINUED] doxycycline  (VIBRAMYCIN ) 100 MG capsule Take 1 capsule (100 mg total) by mouth 2 (two) times daily. (Patient not taking: Reported on 08/13/2023)   [DISCONTINUED] metFORMIN  (GLUCOPHAGE ) 1000 MG tablet Take 1 tablet (1,000 mg total) by mouth 2 (two) times daily with a meal.   [DISCONTINUED] oxyCODONE  (OXY IR/ROXICODONE ) 5 MG immediate release tablet Take 1-2 tablets (5-10 mg total) by mouth 3 (three) times daily as needed for moderate pain (pain score 4-6) (pain score 4-6). (Patient not taking: Reported on 08/13/2023)   No facility-administered encounter medications on file as of 01/27/2024.    Review of Systems  Review of Systems  No chest pain with exertion.  No orthopnea  or PND.  Comprehensive review of systems otherwise negative. Physical Exam  BP (!) 156/91   Pulse 63   Temp 98.5 F (36.9 C) (Oral)   Ht 5' 7 (1.702 m)   Wt 214 lb (97.1 kg)   SpO2 99%   BMI 33.52 kg/m   Wt Readings from Last 5 Encounters:  01/27/24 214 lb (97.1 kg)  01/08/24 211 lb 9.6 oz (96 kg)  11/12/23 222 lb 12.8 oz (101.1 kg)  10/13/23 228 lb 6.4 oz (103.6 kg)  08/13/23 221 lb 3.2 oz (100.3 kg)    BMI Readings from Last 5 Encounters:  01/27/24 33.52 kg/m  01/08/24 31.25 kg/m  11/12/23 32.90 kg/m  10/13/23 33.73 kg/m  08/13/23 32.67 kg/m     Physical Exam General: Sitting in chair, no acute distress Eyes: EOMI, no icterus Neck: Supple, no JVP Pulmonary: Clear, no work of breathing Cardiovascular: Regular rate and rhythm Abdomen: Nondistended MSK: No synovitis, no joint effusion Neuro: Normal gait, no weakness Psych: Normal mood, full affect   Assessment & Plan:   Chronic cough: Present for months.  High suspicion for multiple contributors including most likely GERD given dry cough.  Uncontrolled acid reflux, see below.  In addition, asthma is quite possible given hyperinflated chest x-ray 12/30/2023 and 03/01/2023 in the setting of a non-smoker.  Trial Symbicort  2 puffs twice daily, assess response.  If not improving, consider addition of nasal sprays, treatment of postnasal drip.  GERD: With breakthrough acid symptoms despite PPI daily.  Increase to twice daily.  Consider referral to GI if not improving.   Return in about 3 months (around 04/27/2024) for f/u Dr. Annella.   Donnice JONELLE Annella, MD 02/01/2024

## 2024-02-02 ENCOUNTER — Encounter: Payer: Self-pay | Admitting: Cardiology

## 2024-02-02 ENCOUNTER — Ambulatory Visit: Attending: Cardiology | Admitting: Cardiology

## 2024-02-02 VITALS — BP 134/80 | HR 56 | Ht 68.0 in | Wt 213.0 lb

## 2024-02-02 DIAGNOSIS — E785 Hyperlipidemia, unspecified: Secondary | ICD-10-CM | POA: Diagnosis present

## 2024-02-02 DIAGNOSIS — I358 Other nonrheumatic aortic valve disorders: Secondary | ICD-10-CM | POA: Diagnosis not present

## 2024-02-02 DIAGNOSIS — E1169 Type 2 diabetes mellitus with other specified complication: Secondary | ICD-10-CM | POA: Insufficient documentation

## 2024-02-02 DIAGNOSIS — I951 Orthostatic hypotension: Secondary | ICD-10-CM | POA: Diagnosis present

## 2024-02-02 DIAGNOSIS — I10 Essential (primary) hypertension: Secondary | ICD-10-CM | POA: Diagnosis not present

## 2024-02-02 MED ORDER — LOSARTAN POTASSIUM 25 MG PO TABS: 25.0000 mg | ORAL_TABLET | Freq: Every evening | ORAL | 3 refills | Status: DC

## 2024-02-02 NOTE — Progress Notes (Signed)
 Cardiology Office Note:  .   Date:  02/09/2024  ID:  [name] [name], DOB 12-Sep-1955, MRN [id number] PCP: Lorren Greig PARAS, NP  [facility] Providers Cardiologist:  Lenis Clay, MD     Chief Complaint  Patient presents with   Follow-up    Reassessment of blood pressure dropping having added back valsartan       Patient Profile: .     [name] [name] is a mildly obese 68 y.o. 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       [name] [name] was last seen on June 10, 2023 for follow-up to discuss Echocardiogram results and reassess blood pressures.  Original consult was on March 06, 2023 at the request of Lorren Greig PARAS, NP.  We stopped her carvedilol  and continued 40 mg losartan  prior to her knee surgery in October 2024.  Was doing well after that.  Still getting used to the stiffness of her knee.  We reviewed the echocardiogram was relatively normal.  Likely cause for murmur was aortic sclerosis but no stenosis.  She noted some skipping heartbeats but nothing prolonged.  Plan was to recheck 2D echo in 3 years, and CTA chest-aorta in 2 to 3 years to reassess aortic size. => Recommended continue aggressive hydration, avoid diuretics, and continue off of carvedilol  with valsartan  40 mg.  Plan was to follow-up as needed.  Subjective  Discussed the use of AI scribe software for clinical note transcription with the patient, who gave verbal consent to proceed.  History of Present Illness [name] [name] is a 68 year old female with hyperlipidemia and aortic sclerosis who presents with recurrent issues of dropping blood pressures.  She has been experiencing recurrent issues with dropping blood pressures since discontinuing valsartan  almost two months ago. Her blood pressure is mostly normal now, sometimes high in the morning but tends to decrease  throughout the day. She has not experienced significant drops in blood pressure since stopping valsartan , although she occasionally feels dizzy when her blood pressure is low.  She is currently taking atorvastatin  for hyperlipidemia and metformin  for diabetes, both of which are well-controlled. She also takes aspirin  81 mg daily, which she purchases over the counter. She previously took Xarelto  but discontinued it after her surgery.  She experiences occasional heart flutters. No dizziness, syncope, chest pain, or pressure. No nausea or queasiness associated with blood pressure drops, although she sometimes feels dizzy. She maintains hydration by drinking water regularly.  Her blood pressure readings at home have varied, with recent measurements showing systolic readings of 144 mmHg and diastolic readings of 94 mmHg, with a pulse of 72. She typically checks her blood pressure in the morning and does not routinely check it in the evening. When her blood pressure is low, she feels dizzy and needs to sit down, but it usually resolves within a few minutes after eating or drinking.  She has a history of aortic sclerosis with a murmur.     Objective   Current Meds  Medication Sig   anastrozole  (ARIMIDEX ) 1 MG tablet Take 1 tablet (1 mg total) by mouth daily.   atorvastatin  (LIPITOR) 40 MG tablet Take 1 tablet (40 mg total) by mouth daily.   benzonatate  (TESSALON  PERLES) 100 MG capsule Take 1 capsule (100 mg total) by mouth 3 (three) times daily as needed for cough.   budesonide -formoterol  (SYMBICORT ) 160-4.5  MCG/ACT inhaler Inhale 2 puffs into the lungs 2 (two) times daily.   calcium -vitamin D (OSCAL WITH D) 500-5 MG-MCG tablet Take 1 tablet by mouth daily.   cetirizine  (ZYRTEC ) 10 MG tablet Take 1 tablet (10 mg total) by mouth daily. (Patient taking differently: Take 10 mg by mouth daily. Patient taking as needed)   diclofenac  (VOLTAREN ) 50 MG EC tablet Take 1 tablet (50 mg total) by mouth 2 (two)  times daily as needed.   diclofenac  (VOLTAREN ) 75 MG EC tablet Take 75 mg by mouth 2 (two) times daily.   docusate sodium  (COLACE) 100 MG capsule Take 1 capsule (100 mg total) by mouth 2 (two) times daily.   glucose blood (TRUE METRIX BLOOD GLUCOSE TEST) test strip 1 each by Other route 4 (four) times daily -  before meals and at bedtime. Use as instructed   lansoprazole  (PREVACID ) 30 MG capsule Take 1 capsule (30 mg total) by mouth 2 (two) times daily before a meal.   Magnesium  100 MG CAPS Take 100 mg by mouth daily.   metFORMIN  (GLUCOPHAGE ) 500 MG tablet Take 1 tablet (500 mg total) by mouth daily with breakfast.   methocarbamol  (ROBAXIN ) 500 MG tablet Take 1 tablet (500 mg total) by mouth every 6 (six) hours as needed for muscle spasms.   Multiple Vitamins-Minerals (CENTRUM SILVER 50+WOMEN PO) Take 1 capsule by mouth daily.   omeprazole  (PRILOSEC) 20 MG capsule Take 1 capsule (20 mg total) by mouth daily. (Patient taking differently: Take 20 mg by mouth daily. Patient takes sometimes however it does not work well)   ondansetron  (ZOFRAN ) 4 MG tablet Take 1 tablet (4 mg total) by mouth every 6 (six) hours as needed for nausea.   rivaroxaban  (XARELTO ) 10 MG TABS tablet - NOT TAKING Take 1 tablet (10 mg total) by mouth daily with breakfast. To be taken after surgery to prevent blood clots   UNABLE TO FIND Take 1 Scoop by mouth daily. Med Name: Mervin Masters   valsartan  (DIOVAN ) 40 MG tablet Take 1 tablet (40 mg total) by mouth daily.=> NOT TAKING   vitamin C (ASCORBIC ACID) 500 MG tablet Take 500 mg by mouth daily.    Studies Reviewed: SABRA   EKG Interpretation Date/Time:  Tuesday February 02 2024 08:04:18 EDT Ventricular Rate:  56 PR Interval:  154 QRS Duration:  112 QT Interval:  426 QTC Calculation: 411 R Axis:   -51  Text Interpretation: Sinus bradycardia Left anterior fascicular block Moderate voltage criteria for LVH, may be normal variant ( R in aVL , Cornell product ) When compared with  ECG of 06-Mar-2023 16:41, No significant change was found Confirmed by Anner Lenis ([id number]) on 02/02/2024 8:09:49 AM    Lab Results  Component Value Date   CHOL 127 11/12/2023   HDL 66 11/12/2023   LDLCALC 43 11/12/2023   TRIG 98 11/12/2023   CHOLHDL 1.9 11/12/2023   Lab Results  Component Value Date   HGBA1C 6.2 11/12/2023    Echocardiogram (04/06/2023): Left ventricle size and function normal, ejection fraction 55-60%, no regional wall motion abnormalities, mild left atrial dilation, mitral valve normal, aortic valve sclerosis with trivial regurgitation, aorta 40 mm (--echocardiographic films personally reviewed, I agree with the findings.   Risk Assessment/Calculations:          Physical Exam:   VS:  BP 134/80   Pulse (!) 56   Ht 5' 8 (1.727 m)   Wt 213 lb (96.6 kg)   SpO2 94%  BMI 32.39 kg/m    Wt Readings from Last 3 Encounters:  02/02/24 213 lb (96.6 kg)  01/27/24 214 lb (97.1 kg)  01/08/24 211 lb 9.6 oz (96 kg)      GEN: Well nourished, well developed in no acute distress; mildly obese.  Otherwise healthy appearing. NECK: No JVD; No carotid bruits CARDIAC: Normal S1, S2; RRR, no murmurs, rubs, gallops RESPIRATORY:  Clear to auscultation without rales, wheezing or rhonchi ; nonlabored, good air movement. ABDOMEN: Soft, non-tender, non-distended EXTREMITIES:  No edema; No deformity      ASSESSMENT AND PLAN: .    Problem List Items Addressed This Visit       Cardiology Problems   Aortic valve sclerosis (Chronic)   Aortic sclerosis without significant valve narrowing. - Follow-up echocardiogram in a couple of years to monitor valve status.      Relevant Medications   losartan  (COZAAR ) 25 MG tablet   Hyperlipidemia associated with type 2 diabetes mellitus (HCC) (Chronic)   Excellent lipid control with most recent LDL of 43 and A1c of 6.3 on 40 mL of atorvastatin  and metformin  plus lifestyle modifications for pacing control.      Relevant Medications    losartan  (COZAAR ) 25 MG tablet   Other Relevant Orders   EKG 12-Lead (Completed)   Orthostatic hypotension - Primary (Chronic)   Hypertension with episodes of orthostatic hypotension.  Need to ensure adequate hydration. Will switch from valsartan  to losartan , but would allow for mild permissive hypertension with blood pressures up to the 140s and 150s if necessary.      Relevant Medications   losartan  (COZAAR ) 25 MG tablet   Other Relevant Orders   EKG 12-Lead (Completed)   Primary hypertension (Chronic)   Blood pressure mostly normal post-valsartan  cessation, occasional morning elevations. No recent dizziness or syncope. Possible dysautonomia affecting blood pressure regulation. Goal: maintain BP in 120s/80s, manage fluctuations. - Initiate losartan  25 mg, half tablet in the evening for 6-8 days, then full dose. - Monitor blood pressure regularly, focus on morning and evening readings. - Ensure adequate hydration and dietary intake to prevent hypotension. - Follow up in 2 months with ambulatory blood pressure monitoring.      Relevant Medications   losartan  (COZAAR ) 25 MG tablet            Follow-Up: Return in about 2 months (around 04/03/2024) for Damien Braver, NP or Katlyn West, NP, Follow-up with APP, Alternate 6 month follow-up with APP & MD.      Signed, Alm MICAEL Clay, MD, MS Alm Clay, M.D., M.S. Interventional Cardiologist  [facility] Pager # [phone]

## 2024-02-02 NOTE — Patient Instructions (Signed)
 Medication Instructions:  Start Losartan  25 mg every evening-  Take a half of a tablet for the first 6 days, then increase to a whole tablet every evening.  *If you need a refill on your cardiac medications before your next appointment, please call your pharmacy*  Lab Work: None ordered If you have labs (blood work) drawn today and your tests are completely normal, you will receive your results only by: MyChart Message (if you have MyChart) OR A paper copy in the mail If you have any lab test that is abnormal or we need to change your treatment, we will call you to review the results.  Testing/Procedures: None ordered  Follow-Up: At [facility], you and your health needs are our priority.  As part of our continuing mission to provide you with exceptional heart care, our providers are all part of one team.  This team includes your primary Cardiologist (physician) and Advanced Practice Providers or APPs (Physician Assistants and Nurse Practitioners) who all work together to provide you with the care you need, when you need it.  Your next appointment:    2 months with Damien Braver, NP or Katlyn West, NP   We recommend signing up for the patient portal called MyChart.  Sign up information is provided on this After Visit Summary.  MyChart is used to connect with patients for Virtual Visits (Telemedicine).  Patients are able to view lab/test results, encounter notes, upcoming appointments, etc.  Non-urgent messages can be sent to your provider as well.   To learn more about what you can do with MyChart, go to [email or web address].

## 2024-02-09 ENCOUNTER — Encounter: Payer: Self-pay | Admitting: Cardiology

## 2024-02-09 ENCOUNTER — Encounter: Payer: Self-pay | Admitting: Family

## 2024-02-09 DIAGNOSIS — E785 Hyperlipidemia, unspecified: Secondary | ICD-10-CM

## 2024-02-09 MED ORDER — ATORVASTATIN CALCIUM 40 MG PO TABS: 40.0000 mg | ORAL_TABLET | Freq: Every day | ORAL | 0 refills | Status: DC

## 2024-02-09 NOTE — Assessment & Plan Note (Signed)
 Blood pressure mostly normal post-valsartan  cessation, occasional morning elevations. No recent dizziness or syncope. Possible dysautonomia affecting blood pressure regulation. Goal: maintain BP in 120s/80s, manage fluctuations. - Initiate losartan  25 mg, half tablet in the evening for 6-8 days, then full dose. - Monitor blood pressure regularly, focus on morning and evening readings. - Ensure adequate hydration and dietary intake to prevent hypotension. - Follow up in 2 months with ambulatory blood pressure monitoring.

## 2024-02-09 NOTE — Assessment & Plan Note (Signed)
 Aortic sclerosis without significant valve narrowing. - Follow-up echocardiogram in a couple of years to monitor valve status.

## 2024-02-09 NOTE — Assessment & Plan Note (Signed)
 Hypertension with episodes of orthostatic hypotension.  Need to ensure adequate hydration. Will switch from valsartan  to losartan , but would allow for mild permissive hypertension with blood pressures up to the 140s and 150s if necessary.

## 2024-02-09 NOTE — Assessment & Plan Note (Signed)
 Excellent lipid control with most recent LDL of 43 and A1c of 6.3 on 40 mL of atorvastatin  and metformin  plus lifestyle modifications for pacing control.

## 2024-02-18 ENCOUNTER — Other Ambulatory Visit: Payer: Self-pay | Admitting: Family

## 2024-02-18 DIAGNOSIS — E1165 Type 2 diabetes mellitus with hyperglycemia: Secondary | ICD-10-CM

## 2024-02-23 ENCOUNTER — Ambulatory Visit: Admitting: Pulmonary Disease

## 2024-02-24 ENCOUNTER — Ambulatory Visit (INDEPENDENT_AMBULATORY_CARE_PROVIDER_SITE_OTHER): Admitting: Family

## 2024-02-24 ENCOUNTER — Other Ambulatory Visit (HOSPITAL_COMMUNITY)
Admission: RE | Admit: 2024-02-24 | Discharge: 2024-02-24 | Disposition: A | Discharge status: Home / Self Care | Source: Ambulatory Visit | Attending: Family | Admitting: Family

## 2024-02-24 ENCOUNTER — Encounter: Payer: Self-pay | Admitting: Family

## 2024-02-24 ENCOUNTER — Ambulatory Visit: Payer: Self-pay | Admitting: Family

## 2024-02-24 VITALS — BP 129/76 | HR 70 | Temp 98.3°F | Resp 16 | Ht 68.0 in | Wt 214.8 lb

## 2024-02-24 DIAGNOSIS — I10 Essential (primary) hypertension: Secondary | ICD-10-CM | POA: Diagnosis not present

## 2024-02-24 DIAGNOSIS — E785 Hyperlipidemia, unspecified: Secondary | ICD-10-CM | POA: Diagnosis not present

## 2024-02-24 DIAGNOSIS — N3000 Acute cystitis without hematuria: Secondary | ICD-10-CM

## 2024-02-24 DIAGNOSIS — R10A0 Flank pain, unspecified side: Secondary | ICD-10-CM | POA: Insufficient documentation

## 2024-02-24 DIAGNOSIS — G43909 Migraine, unspecified, not intractable, without status migrainosus: Secondary | ICD-10-CM | POA: Diagnosis not present

## 2024-02-24 LAB — POCT URINALYSIS DIP (CLINITEK)
Bilirubin, UA: NEGATIVE
Blood, UA: NEGATIVE
Glucose, UA: NEGATIVE mg/dL
Ketones, POC UA: NEGATIVE mg/dL
Nitrite, UA: NEGATIVE
POC PROTEIN,UA: NEGATIVE
Spec Grav, UA: 1.01 (ref 1.010–1.025)
Urobilinogen, UA: 0.2 U/dL
pH, UA: 6.5 (ref 5.0–8.0)

## 2024-02-24 MED ORDER — SUMATRIPTAN SUCCINATE 25 MG PO TABS: ORAL_TABLET | ORAL | 1 refills | Status: AC

## 2024-02-24 NOTE — Progress Notes (Signed)
 3 month follow up,  patient is having headaches in the night that is waking her up.  Patient is having lower flank pain

## 2024-02-24 NOTE — Progress Notes (Signed)
 Patient ID: [name] [name], female    DOB: 1956/03/29  MRN: [id number]  CC: Chronic Conditions Follow-Up  Subjective: [name] [name] is a 68 y.o. female who presents for chronic conditions follow-up.  Her concerns today include:  - Established with Cardiology for chronic conditions management.  - Intermittent headaches. Denies red flag symptoms. Taking over-the-counter medications with minimal improvement.  - Right lower flank pain. Denies red flag symptoms.   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 90 tablet 4   atorvastatin  (LIPITOR) 40 MG tablet Take 1 tablet (40 mg total) by mouth daily. 90 tablet 0   benzonatate  (TESSALON  PERLES) 100 MG capsule Take 1 capsule (100 mg total) by mouth 3 (three) times daily as needed for cough. 30 capsule 1   Blood Glucose Monitoring Suppl (ACCU-CHEK GUIDE ME) w/Device KIT USE 1  TO CHECK GLUCOSE 4 TIMES DAILY BEFORE  MEALS  AND  AT  BEDTIME 1 kit 0   budesonide -formoterol  (SYMBICORT ) 160-4.5 MCG/ACT  inhaler Inhale 2 puffs into the lungs 2 (two) times daily. 1 each 6   calcium -vitamin D (OSCAL WITH D) 500-5 MG-MCG tablet Take 1 tablet by mouth daily.     cetirizine  (ZYRTEC ) 10 MG tablet Take 1 tablet (10 mg total) by mouth daily. (Patient taking differently: Take 10 mg by mouth daily. Patient taking as needed) 30 tablet 2   diclofenac  (VOLTAREN ) 50 MG EC tablet Take 1 tablet (50 mg total) by mouth 2 (two) times daily as needed. 180 tablet 0   docusate sodium  (COLACE) 100 MG capsule Take 1 capsule (100 mg total) by mouth 2 (two) times daily. 10 capsule 0   glucose blood (TRUE METRIX BLOOD GLUCOSE TEST) test strip 1 each by Other route 4 (four) times daily -  before meals and at bedtime. Use as instructed 100 each 12   lansoprazole  (PREVACID ) 30 MG capsule Take 1 capsule (30 mg total) by mouth 2 (two) times daily before a meal. 180 capsule 1   losartan  (COZAAR ) 25 MG tablet Take 1 tablet (25 mg total) by mouth every evening. Take a half a tablet for the first 6 days, then increase to a whole tablet. 90 tablet 3   Magnesium  100 MG CAPS Take 100 mg by mouth daily.     metFORMIN  (GLUCOPHAGE ) 500 MG tablet Take 1 tablet (500 mg total) by mouth daily with breakfast. 90 tablet 0   methocarbamol  (ROBAXIN ) 500 MG tablet Take 1 tablet (500 mg total) by mouth every 6 (six) hours as needed for muscle spasms. 20 tablet  2   Multiple Vitamins-Minerals (CENTRUM SILVER 50+WOMEN PO) Take 1 capsule by mouth daily.     omeprazole  (PRILOSEC) 20 MG capsule Take 1 capsule (20 mg total) by mouth daily. (Patient taking differently: Take 20 mg by mouth daily. Patient takes sometimes however it does not work well) 90 capsule 0   ondansetron  (ZOFRAN ) 4 MG tablet Take 1 tablet (4 mg total) by mouth every 6 (six) hours as needed for nausea. 20 tablet 0   vitamin C (ASCORBIC ACID) 500 MG tablet Take 500 mg by mouth daily.     UNABLE TO FIND Take 1 Scoop by mouth daily. Med Name: Mervin Masters     No current facility-administered  medications on file prior to visit.    Allergies  Allergen Reactions   Sulfa Antibiotics Itching    Social History   Socioeconomic History   Marital status: Single    Spouse name: Not on file   Number of children: 1   Years of education: Not on file   Highest education level: 9th grade  Occupational History   Not on file  Tobacco Use   Smoking status: Never   Smokeless tobacco: Never  Vaping Use   Vaping status: Never Used  Substance and Sexual Activity   Alcohol use: Yes    Alcohol/week: 1.0 standard drink of alcohol    Types: 1 Glasses of wine per week   Drug use: Never   Sexual activity: Not Currently  Other Topics Concern   Not on file  Social History Narrative   Tobacco use, amount per day now:   Past tobacco use, amount per day:   How many years did you use tobacco:   Alcohol use (drinks per week): 1 drink occasionally.   Diet:   Do you drink/eat things with caffeine: Yes   Marital status:  Single                                What year were you married?   Do you live in a house, apartment, assisted living, condo, trailer, etc.? House   Is it one or more stories? One   How many persons live in your home? 5   Do you have pets in your home?( please list) Cat   Highest Level of education completed: High School.   Current or past profession: Psychologist, sport and exercise.   Do you exercise?  Yes                                   Type and how often? 2 times weekly.   Do you have a living will? No   Do you have a DNR form?    No                               If not, do you want to discuss one?   Do you have signed POA/HPOA forms?     No                   If so, please bring to you appointment    Do you have any difficulty bathing or dressing yourself? No   Do you have difficulty preparing food or eating? No   Do you have difficulty managing your medications? No   Do you have  any difficulty managing your finances? No   Do you have any difficulty affording your medications? No           Social Drivers of Corporate investment banker Strain: Low Risk  (11/12/2023)   Overall Financial Resource Strain (CARDIA)    Difficulty of Paying Living Expenses: Not hard at all  Food Insecurity: No Food Insecurity (11/12/2023)   Hunger Vital Sign    Worried About Running Out of Food in the Last Year: Never true    Ran Out of Food in the Last Year: Never true  Transportation Needs: No Transportation Needs (11/12/2023)   PRAPARE - Administrator, Civil Service (Medical): No    Lack of Transportation (Non-Medical): No  Physical Activity: Insufficiently Active (11/12/2023)   Exercise Vital Sign    Days of Exercise per Week: 4 days    Minutes of Exercise per Session: 30 min  Stress: No Stress Concern Present (11/12/2023)   [employer] of Occupational Health - Occupational Stress Questionnaire    Feeling of Stress: Not at all  Social Connections: Not on file  Intimate Partner Violence: Not At Risk (04/25/2022)   Humiliation, Afraid, Rape, and Kick questionnaire    Fear of Current or Ex-Partner: No    Emotionally Abused: No    Physically Abused: No    Sexually Abused: No    Family History  Problem Relation Age of Onset   Asthma Mother    Arthritis Sister    Asthma Brother    Arthritis Brother     Past Surgical History:  Procedure Laterality Date   BREAST BIOPSY Right 02/03/2020   x2   BREAST BIOPSY Right 02/06/2020   x2   BREAST LUMPECTOMY Right 07/26/2020   BREAST LUMPECTOMY WITH RADIOACTIVE SEED AND SENTINEL LYMPH NODE BIOPSY Right 07/26/2020   Procedure: RIGHT BREAST LUMPECTOMY WITH RADIOACTIVE SEED X 2 AND SENTINEL LYMPH NODE BIOPSY;  Surgeon: Vanderbilt Ned, MD;  Location: [facility];  Service: General;  Laterality: Right;   IR IMAGING GUIDED PORT INSERTION  02/23/2020   PORT-A-CATH REMOVAL N/A 07/26/2020   Procedure: REMOVAL PORT-A-CATH;  Surgeon: Vanderbilt Ned, MD;  Location: [facility];  Service: General;   Laterality: N/A;   TOTAL KNEE ARTHROPLASTY Left 11/18/2021   Procedure: LEFT TOTAL KNEE ARTHROPLASTY;  Surgeon: Jerri Kay HERO, MD;  Location: [facility];  Service: Orthopedics;  Laterality: Left;   TOTAL KNEE ARTHROPLASTY Right 04/13/2023   Procedure: RIGHT TOTAL KNEE ARTHROPLASTY;  Surgeon: Jerri Kay HERO, MD;  Location: [facility];  Service: Orthopedics;  Laterality: Right;    ROS: Review of Systems Negative except as stated above  PHYSICAL EXAM: BP 129/76   Pulse 70   Temp 98.3 F (36.8 C) (Oral)   Resp 16   Ht 5' 8 (1.727 m)   Wt 214 lb 12.8 oz (97.4 kg)   SpO2 94%   BMI 32.66 kg/m   Physical Exam HENT:     Head: Normocephalic and atraumatic.     Nose: Nose normal.     Mouth/Throat:     Mouth: Mucous membranes are moist.     Pharynx: Oropharynx is clear.  Eyes:     Extraocular Movements: Extraocular movements intact.     Conjunctiva/sclera: Conjunctivae normal.     Pupils: Pupils are equal, round, and reactive to light.  Cardiovascular:     Rate and Rhythm: Normal rate and regular rhythm.     Pulses: Normal pulses.  Heart sounds: Normal heart sounds.  Pulmonary:     Effort: Pulmonary effort is normal.     Breath sounds: Normal breath sounds.  Abdominal:     General: Bowel sounds are normal.     Palpations: Abdomen is soft.  Musculoskeletal:        General: Normal range of motion.     Cervical back: Normal range of motion and neck supple.  Neurological:     General: No focal deficit present.     Mental Status: She is alert and oriented to person, place, and time.  Psychiatric:        Mood and Affect: Mood normal.        Behavior: Behavior normal.     ASSESSMENT AND PLAN: 1. Primary hypertension (Primary) - Keep all scheduled appointments with established Cardiology.   2. Hyperlipidemia, unspecified hyperlipidemia type - Keep all scheduled appointments with established Cardiology.   3. Migraine without status migrainosus, not intractable, unspecified migraine  type - Sumatriptan as prescribed. Counseled on medication adherence/adverse effects.  - Follow-up with primary provider in 4 weeks or sooner if needed. - SUMAtriptan (IMITREX) 25 MG tablet; Take 25 mg (1 tablet total) by mouth at the start of the headache. May repeat in 2 hours x 1 if headache persists. Max of 2 tablets/24 hours.  Dispense: 90 tablet; Refill: 1  4. Flank pain, unspecified laterality - Routine screening.  - POCT URINALYSIS DIP (CLINITEK); Future - Urine Culture - Cervicovaginal ancillary only - Basic Metabolic Panel - POCT URINALYSIS DIP (CLINITEK)   Patient was given the opportunity to ask questions.  Patient verbalized understanding of the plan and was able to repeat key elements of the plan. Patient was given clear instructions to go to Emergency Department or return to medical center if symptoms don't improve, worsen, or new problems develop.The patient verbalized understanding.   Orders Placed This Encounter  Procedures   Urine Culture   Basic Metabolic Panel   POCT URINALYSIS DIP (CLINITEK)     Requested Prescriptions   Signed Prescriptions Disp Refills   SUMAtriptan (IMITREX) 25 MG tablet 90 tablet 1    Sig: Take 25 mg (1 tablet total) by mouth at the start of the headache. May repeat in 2 hours x 1 if headache persists. Max of 2 tablets/24 hours.    Return in about 3 months (around 05/26/2024) for Follow-Up or next available chronic conditions.  Greig JINNY Drones, NP

## 2024-02-25 LAB — CERVICOVAGINAL ANCILLARY ONLY
Bacterial Vaginitis (gardnerella): NEGATIVE
Candida Glabrata: NEGATIVE
Candida Vaginitis: NEGATIVE
Chlamydia: NEGATIVE
Comment: NEGATIVE
Comment: NEGATIVE
Comment: NEGATIVE
Comment: NEGATIVE
Comment: NEGATIVE
Comment: NORMAL
Neisseria Gonorrhea: NEGATIVE
Trichomonas: NEGATIVE

## 2024-02-25 LAB — BASIC METABOLIC PANEL WITH GFR
BUN/Creatinine Ratio: 11 — ABNORMAL LOW (ref 12–28)
BUN: 11 mg/dL (ref 8–27)
CO2: 23 mmol/L (ref 20–29)
Calcium: 9.7 mg/dL (ref 8.7–10.3)
Chloride: 103 mmol/L (ref 96–106)
Creatinine, Ser: 0.96 mg/dL (ref 0.57–1.00)
Glucose: 85 mg/dL (ref 70–99)
Potassium: 4.4 mmol/L (ref 3.5–5.2)
Sodium: 140 mmol/L (ref 134–144)
eGFR: 66 mL/min/1.73 (ref >59)

## 2024-02-28 LAB — URINE CULTURE

## 2024-02-29 MED ORDER — AMOXICILLIN-POT CLAVULANATE 875-125 MG PO TABS: 1.0000 | ORAL_TABLET | Freq: Two times a day (BID) | ORAL | 0 refills | Status: DC

## 2024-03-14 ENCOUNTER — Encounter: Payer: Self-pay | Admitting: Radiology

## 2024-03-18 ENCOUNTER — Encounter: Payer: Self-pay | Admitting: *Deleted

## 2024-03-18 NOTE — Progress Notes (Signed)
 [facility] orders placed via their portal.

## 2024-04-04 ENCOUNTER — Other Ambulatory Visit: Payer: Self-pay | Admitting: Family

## 2024-04-04 DIAGNOSIS — M25569 Pain in unspecified knee: Secondary | ICD-10-CM

## 2024-04-05 ENCOUNTER — Ambulatory Visit: Admitting: Nurse Practitioner

## 2024-04-05 NOTE — Telephone Encounter (Signed)
 Requested Prescriptions  Pending Prescriptions Disp Refills   diclofenac  (VOLTAREN ) 50 MG EC tablet [Pharmacy Med Name: Diclofenac  Sodium 50 MG Oral Tablet Delayed Release] 180 tablet 0    Sig: Take 1 tablet by mouth twice daily as needed     Analgesics:  NSAIDS Failed - 04/05/2024  3:48 PM      Failed - Manual Review: Labs are only required if the patient has taken medication for more than 8 weeks.      Failed - HGB in normal range and within 360 days    Hemoglobin  Date Value Ref Range Status  04/14/2023 9.6 (L) 12.0 - 15.0 g/dL Final  93/93/7975 89.9 (L) 12.0 - 15.0 g/dL Final         Failed - HCT in normal range and within 360 days    HCT  Date Value Ref Range Status  04/14/2023 30.4 (L) 36.0 - 46.0 % Final         Passed - Cr in normal range and within 360 days    Creatinine  Date Value Ref Range Status  10/16/2022 0.79 0.44 - 1.00 mg/dL Final   Creat  Date Value Ref Range Status  07/21/2022 0.75 0.50 - 1.05 mg/dL Final   Creatinine, Ser  Date Value Ref Range Status  02/24/2024 0.96 0.57 - 1.00 mg/dL Final   Creatinine, Urine  Date Value Ref Range Status  10/24/2022 30 20 - 275 mg/dL Final         Passed - PLT in normal range and within 360 days    Platelets  Date Value Ref Range Status  04/14/2023 304 150 - 400 K/uL Final   Platelet Count  Date Value Ref Range Status  10/16/2022 192 150 - 400 K/uL Final         Passed - eGFR is 30 or above and within 360 days    GFR, Estimated  Date Value Ref Range Status  04/03/2023 >60 >60 mL/min Final    Comment:    (NOTE) Calculated using the CKD-EPI Creatinine Equation (2021)   10/16/2022 >60 >60 mL/min Final    Comment:    (NOTE) Calculated using the CKD-EPI Creatinine Equation (2021)    eGFR  Date Value Ref Range Status  02/24/2024 66 >59 mL/min/1.73 Final         Passed - Patient is not pregnant      Passed - Valid encounter within last 12 months    Recent Outpatient Visits           1 month ago  Primary hypertension   [facility], [place], NP   2 months ago Primary hypertension   [facility] [facility], Amy J, NP   4 months ago Type 2 diabetes mellitus with hyperglycemia, without long-term current use of insulin  [facility])   [place] [facility], Amy J, NP   7 months ago Type 2 diabetes mellitus with hyperglycemia, without long-term current use of insulin  [facility])   [place] [facility], Greig PARAS, NP   11 months ago Hospital discharge follow-up   [facility], [place], NP       Future Appointments             In 3 weeks Parthenia, Olivia HERO, PA-C [facility] Dept of [employer]. [employer] [employer], [employer]

## 2024-04-08 ENCOUNTER — Encounter: Payer: Self-pay | Admitting: Family

## 2024-04-12 NOTE — Progress Notes (Signed)
°  Cardiology Office Note:  .   Date:  04/26/2024  ID:  [name] [name], DOB [birth year], MRN [id number] PCP: Jaycee Greig PARAS, NP  [facility] Providers Cardiologist:  Lenis Clay, MD    History of Present Illness: .   [name] [name] is a 68 y.o. female  with hyperlipidemia and aortic sclerosis. She was seen 02/02/24 with hypertension and episodes of orthostatic hypotension. Valsartan  changed to losartan , adequate hydration and ambulatory BP monitoring.  Patient comes in with daughter and grandson. She gets tired when she walks sometimes. It doesn't occur if she walks consistently. She gets 2500 steps and walks 15-20 min at a time. BP overall well controlled. Only up if she doesn't sleep well. She is staying hydrated and no further dizziness. Has occasional palpitations but better than in the past  ROS:    Studies Reviewed: SABRA         Prior CV Studies:   Echo 03/2023 IMPRESSIONS     1. Left ventricular ejection fraction, by estimation, is 55 to 60%. The  left ventricle has normal function. The left ventricle has no regional  wall motion abnormalities. There is mild left ventricular hypertrophy.  Left ventricular diastolic parameters  were normal. The average left ventricular global longitudinal strain is  -18.6 %. The global longitudinal strain is normal.   2. Right ventricular systolic function is normal. The right ventricular  size is normal. There is normal pulmonary artery systolic pressure.   3. Left atrial size was mildly dilated.   4. The mitral valve is abnormal. Trivial mitral valve regurgitation. No  evidence of mitral stenosis.   5. The aortic valve is tricuspid. There is mild calcification of the  aortic valve. There is mild thickening of the aortic valve. Aortic valve  regurgitation is trivial. Aortic valve sclerosis is present, with no  evidence of aortic valve stenosis.   6. Aortic dilatation noted. There is mild dilatation of the ascending  aorta, measuring 40 mm.    7. The inferior vena cava is normal in size with greater than 50%  respiratory variability, suggesting right atrial pressure of 3 mmHg.    Risk Assessment/Calculations:             Physical Exam:   VS:  BP 130/78   Pulse 98   Ht 5' 8 (1.727 m)   Wt 218 lb 12.8 oz (99.2 kg)   SpO2 97%   BMI 33.27 kg/m    Orhtostatics: No data found. Wt Readings from Last 3 Encounters:  04/26/24 218 lb 12.8 oz (99.2 kg)  04/20/24 223 lb (101.2 kg)  02/24/24 214 lb 12.8 oz (97.4 kg)    GEN: Well nourished, well developed in no acute distress NECK: No JVD; No carotid bruits CARDIAC:  RRR, 1/6 systolic murmur LSB RESPIRATORY:  Clear to auscultation without rales, wheezing or rhonchi  ABDOMEN: Soft, non-tender, non-distended EXTREMITIES:  No edema; No deformity   ASSESSMENT AND PLAN: .   Aortic valve sclerosis -minimal murmur on exam. asymptomatic  HTN with episodic orthostatic hypotension. Valsartan  changed to losartan , adequate hydration and ambulatory BP monitoring. 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 Dr. Clay recommended f/u CTA-will order.check bmet today            Dispo: f/u Dr. Clay 1 yr.  Signed, Olivia Pavy, PA-C

## 2024-04-13 NOTE — Telephone Encounter (Signed)
 Please confirm patient request. Thank you.

## 2024-04-20 ENCOUNTER — Ambulatory Visit: Admitting: Pulmonary Disease

## 2024-04-20 ENCOUNTER — Encounter: Payer: Self-pay | Admitting: Pulmonary Disease

## 2024-04-20 VITALS — BP 114/76 | HR 60 | Temp 98.1°F | Ht 68.0 in | Wt 223.0 lb

## 2024-04-20 DIAGNOSIS — R053 Chronic cough: Secondary | ICD-10-CM | POA: Diagnosis not present

## 2024-04-20 DIAGNOSIS — K219 Gastro-esophageal reflux disease without esophagitis: Secondary | ICD-10-CM

## 2024-04-20 NOTE — Progress Notes (Signed)
 @Patient  ID: [name] [name], female    DOB: 27-Aug-1955, 68 y.o.   MRN: [id number]  Chief Complaint  Patient presents with   Cough    Prod cough with white mucus and occ wheezing with laying flat.     Referring provider: Jaycee Greig PARAS, NP  HPI:   68 y.o. woman whom we are seeing for evaluation of dyspnea on exertion and cough.  Chief complaint is chronic cough.  Most recent cardiology note reviewed.  Most recent PCP note reviewed.  Returns for routine follow-up.  Prescribed Symbicort  at last visit given productive cough reported occasional wheeze.  Prior chest x-ray hyperinflated on my review of patient for and 12/2023 concerning for asthma.  She has been using Symbicort  as prescribed.  This helped her cough some.  Little less frequent.  Still produces clear phlegm.  She endorses rhinorrhea postnasal drip.  HPI at initial visit: Patient chief complaint is cough.  Present for some months.  Dry.  Seems worse in the mornings and when she lying down.  No alleviating or exacerbating factors.  No position that makes things better or worse.  No seasonal or environmental factors she can identify to make things better or worse.  She endorses significant heartburn symptoms.  Despite taking PPI once a day.  She also has runny nose, postnasal drip symptoms.  They are been diagnosed with asthma.  But he does have a little bit of shortness of breath.  Usually inclines or stairs.  At rest okay and on flat surfaces usually okay.  Chest x-ray 12/2023 demonstrates clear lung and mild hyperinflation on my review interpretation.  Similar appearing lungs 02/2023 chest x-ray on my review and interpretation.  Questionaires / Pulmonary Flowsheets:   ACT:      No data to display          MMRC:     No data to display          Epworth:      No data to display          Tests:   FENO:  No results found for: NITRICOXIDE  PFT:     No data to display          WALK:      No data to display           Imaging: Personally reviewed and as per EMR in discussion this note No results found.   Lab Results: Personally reviewed CBC    Component Value Date/Time   WBC 8.2 04/14/2023 0944   RBC 3.64 (L) 04/14/2023 0944   HGB 9.6 (L) 04/14/2023 0944   HGB 10.0 (L) 10/16/2022 1532   HCT 30.4 (L) 04/14/2023 0944   PLT 304 04/14/2023 0944   PLT 192 10/16/2022 1532   MCV 83.5 04/14/2023 0944   MCH 26.4 04/14/2023 0944   MCHC 31.6 04/14/2023 0944   RDW 15.8 (H) 04/14/2023 0944   LYMPHSABS 0.8 10/16/2022 1532   MONOABS 0.2 10/16/2022 1532   EOSABS 0.1 10/16/2022 1532   BASOSABS 0.0 10/16/2022 1532    BMET    Component Value Date/Time   NA 140 02/24/2024 0848   K 4.4 02/24/2024 0848   CL 103 02/24/2024 0848   CO2 23 02/24/2024 0848   GLUCOSE 85 02/24/2024 0848   GLUCOSE 109 (H) 04/03/2023 1430   BUN 11 02/24/2024 0848   CREATININE 0.96 02/24/2024 0848   CREATININE 0.79 10/16/2022 1532   CREATININE 0.75 07/21/2022 0820   CALCIUM  9.7 02/24/2024  0848   GFRNONAA >60 04/03/2023 1430   GFRNONAA >60 10/16/2022 1532    BNP No results found for: BNP  ProBNP No results found for: PROBNP  Specialty Problems   None   Allergies  Allergen Reactions   Sulfa Antibiotics Itching    Immunization History  Administered Date(s) Administered    Astrazeneca Covid-19 Vaccine, Pf, 0.5 Ml Non Us   08/13/2019, 08/18/2019   DTaP 09/04/2017, 10/05/2017   Hepatitis B 09/04/2017, 10/05/2017   Influenza,inj,Quad PF,6+ Mos 04/26/2022   Influenza-Unspecified 09/04/2017, 04/19/2019   MMR 09/04/2017   Tdap 04/19/2019   Unspecified SARS-COV-2 Vaccination 09/13/2019, 11/06/2019   Varicella 09/04/2017, 06/30/2018    Past Medical History:  Diagnosis Date   Arthritis    Breast cancer (HCC)    right   Diabetes mellitus without complication (HCC)    GERD (gastroesophageal reflux disease)    Headache    sometimes   History of MRI    Knee, Hip, and Back.   Hypertension     Personal history of chemotherapy    Personal history of radiation therapy     Tobacco History: Social History   Tobacco Use  Smoking Status Former   Types: Cigarettes  Smokeless Tobacco Never  Tobacco Comments   Smoked 20+ years ago- 04/20/2024 occ smoker    Counseling given: Not Answered Tobacco comments: Smoked 20+ years ago- 04/20/2024 occ smoker    Continue to not smoke  Outpatient Encounter Medications as of 04/20/2024  Medication Sig   Accu-Chek Softclix Lancets lancets USE 1  TO CHECK GLUCOSE 4 TIMES DAILY BEFORE MEAL(S) AND AT BEDTIME   anastrozole  (ARIMIDEX ) 1 MG tablet Take 1 tablet (1 mg total) by mouth daily.   atorvastatin  (LIPITOR) 40 MG tablet Take 1 tablet (40 mg total) by mouth daily.   Blood Glucose Monitoring Suppl (ACCU-CHEK GUIDE ME) w/Device KIT USE 1  TO CHECK GLUCOSE 4 TIMES DAILY BEFORE  MEALS  AND  AT  BEDTIME   budesonide -formoterol  (SYMBICORT ) 160-4.5 MCG/ACT inhaler Inhale 2 puffs into the lungs 2 (two) times daily.   calcium -vitamin D (OSCAL WITH D) 500-5 MG-MCG tablet Take 1 tablet by mouth daily.   cetirizine  (ZYRTEC ) 10 MG tablet Take 1 tablet (10 mg total) by mouth daily.   diclofenac  (VOLTAREN ) 50 MG EC tablet Take 1 tablet by mouth twice daily as needed   docusate sodium  (COLACE) 100 MG capsule Take 1 capsule (100 mg total) by mouth 2 (two) times daily.   glucose blood (TRUE METRIX BLOOD GLUCOSE TEST) test strip 1 each by Other route 4 (four) times daily -  before meals and at bedtime. Use as instructed   lansoprazole  (PREVACID ) 30 MG capsule Take 1 capsule (30 mg total) by mouth 2 (two) times daily before a meal.   losartan  (COZAAR ) 25 MG tablet Take 1 tablet (25 mg total) by mouth every evening. Take a half a tablet for the first 6 days, then increase to a whole tablet.   Magnesium  100 MG CAPS Take 100 mg by mouth daily.   metFORMIN  (GLUCOPHAGE ) 500 MG tablet Take 1 tablet (500 mg total) by mouth daily with breakfast.   methocarbamol  (ROBAXIN )  500 MG tablet Take 1 tablet (500 mg total) by mouth every 6 (six) hours as needed for muscle spasms.   Multiple Vitamins-Minerals (CENTRUM SILVER 50+WOMEN PO) Take 1 capsule by mouth daily.   omeprazole  (PRILOSEC) 20 MG capsule Take 1 capsule (20 mg total) by mouth daily.   ondansetron  (ZOFRAN ) 4 MG tablet Take  1 tablet (4 mg total) by mouth every 6 (six) hours as needed for nausea.   SUMAtriptan  (IMITREX ) 25 MG tablet Take 25 mg (1 tablet total) by mouth at the start of the headache. May repeat in 2 hours x 1 if headache persists. Max of 2 tablets/24 hours.   UNABLE TO FIND Take 1 Scoop by mouth daily. Med Name: Mervin Masters   vitamin C (ASCORBIC ACID) 500 MG tablet Take 500 mg by mouth daily.   amoxicillin -clavulanate (AUGMENTIN ) 875-125 MG tablet Take 1 tablet by mouth 2 (two) times daily.   benzonatate  (TESSALON  PERLES) 100 MG capsule Take 1 capsule (100 mg total) by mouth 3 (three) times daily as needed for cough. (Patient not taking: Reported on 04/20/2024)   No facility-administered encounter medications on file as of 04/20/2024.    Review of Systems  Review of Systems  N/a Physical Exam  BP 114/76 (BP Location: Left Arm, Patient Position: Sitting, Cuff Size: Normal)   Pulse 60   Temp 98.1 F (36.7 C) (Temporal)   Ht 5' 8 (1.727 m)   Wt 223 lb (101.2 kg)   SpO2 96%   BMI 33.91 kg/m   Wt Readings from Last 5 Encounters:  04/20/24 223 lb (101.2 kg)  02/24/24 214 lb 12.8 oz (97.4 kg)  02/02/24 213 lb (96.6 kg)  01/27/24 214 lb (97.1 kg)  01/08/24 211 lb 9.6 oz (96 kg)    BMI Readings from Last 5 Encounters:  04/20/24 33.91 kg/m  02/24/24 32.66 kg/m  02/02/24 32.39 kg/m  01/27/24 33.52 kg/m  01/08/24 31.25 kg/m     Physical Exam General: Sitting in chair, distress Eyes: EOMI Neck: No JVP appreciated Pulmonary: Clear good air excursion Cardiovascular: Warm no edema noted   Assessment & Plan:   Chronic cough: Initial evaluation, present for months.  Likely  multifactoral related to GERD, postnasal drip.  Also hyperinflated chest x-ray in the past, begs question underlying asthma.  Symbicort  prescribed last visit with mild improvement.  To continue.  Consider addition of antihistamine, Flonase in the future for postnasal drip.  Given some improvement, after shared decision making, no further medications added at this time.  GERD: Continue PPI.    Return in about 6 months (around 10/19/2024) for f/u Dr. Annella.   Donnice JONELLE Annella, MD 04/20/2024

## 2024-04-20 NOTE — Patient Instructions (Signed)
 Nice to see you again  Continue Symbicort  2 puffs twice a day every day.  Rinse your mouth out with water after use.  No changes to medication today  To further address runny nose or postnasal drip in the future if it worsens, consider take an antihistamine.  These are available over-the-counter.  Brand names include Zyrtec , Xyzal, Claritin, Allegra.  You can use the generic as well.  In addition, use fluticasone, brand-name Flonase, 1 spray each nostril in the evening, can increase to morning and evening if needed.  I am happy to send a prescription for the Flonase in the future if desired.  Return to clinic in 6 months or sooner as needed with Dr. Annella

## 2024-04-26 ENCOUNTER — Encounter: Payer: Self-pay | Admitting: Physician Assistant

## 2024-04-26 ENCOUNTER — Ambulatory Visit: Attending: Physician Assistant | Admitting: Physician Assistant

## 2024-04-26 VITALS — BP 130/78 | HR 98 | Ht 68.0 in | Wt 218.8 lb

## 2024-04-26 DIAGNOSIS — I358 Other nonrheumatic aortic valve disorders: Secondary | ICD-10-CM | POA: Insufficient documentation

## 2024-04-26 DIAGNOSIS — E1169 Type 2 diabetes mellitus with other specified complication: Secondary | ICD-10-CM | POA: Diagnosis present

## 2024-04-26 DIAGNOSIS — I77819 Aortic ectasia, unspecified site: Secondary | ICD-10-CM | POA: Diagnosis present

## 2024-04-26 DIAGNOSIS — I10 Essential (primary) hypertension: Secondary | ICD-10-CM

## 2024-04-26 DIAGNOSIS — E785 Hyperlipidemia, unspecified: Secondary | ICD-10-CM | POA: Insufficient documentation

## 2024-04-26 LAB — BASIC METABOLIC PANEL WITH GFR
BUN/Creatinine Ratio: 12 (ref 12–28)
BUN: 11 mg/dL (ref 8–27)
CO2: 24 mmol/L (ref 20–29)
Calcium: 9.6 mg/dL (ref 8.7–10.3)
Chloride: 103 mmol/L (ref 96–106)
Creatinine, Ser: 0.91 mg/dL (ref 0.57–1.00)
Glucose: 87 mg/dL (ref 70–99)
Potassium: 4.6 mmol/L (ref 3.5–5.2)
Sodium: 140 mmol/L (ref 134–144)
eGFR: 70 mL/min/1.73 (ref >59)

## 2024-04-26 MED ORDER — LOSARTAN POTASSIUM 25 MG PO TABS: 25.0000 mg | ORAL_TABLET | Freq: Every evening | ORAL | 3 refills | Status: AC

## 2024-04-26 NOTE — Patient Instructions (Signed)
 Medication Instructions:  Your physician recommends that you continue on your current medications as directed. Please refer to the Current Medication list given to you today.  *If you need a refill on your cardiac medications before your next appointment, please call your pharmacy*  Lab Work: Today: BMET If you have labs (blood work) drawn today and your tests are completely normal, you will receive your results only by: MyChart Message (if you have MyChart) OR A paper copy in the mail If you have any lab test that is abnormal or we need to change your treatment, we will call you to review the results.  Testing/Procedures: CTA Non-Cardiac CT Angiography (CTA), is a special type of CT scan that uses a computer to produce multi-dimensional views of major blood vessels throughout the body. In CT angiography, a contrast material is injected through an IV to help visualize the blood vessels   Follow-Up: At [facility], you and your health needs are our priority.  As part of our continuing mission to provide you with exceptional heart care, our providers are all part of one team.  This team includes your primary Cardiologist (physician) and Advanced Practice Providers or APPs (Physician Assistants and Nurse Practitioners) who all work together to provide you with the care you need, when you need it.  Your next appointment:   1 year(s)  Provider:   Alm Clay, MD    We recommend signing up for the patient portal called MyChart.  Sign up information is provided on this After Visit Summary.  MyChart is used to connect with patients for Virtual Visits (Telemedicine).  Patients are able to view lab/test results, encounter notes, upcoming appointments, etc.  Non-urgent messages can be sent to your provider as well.   To learn more about what you can do with MyChart, go to [email or web address].   Other Instructions None

## 2024-04-27 ENCOUNTER — Ambulatory Visit: Payer: Self-pay | Admitting: Physician Assistant

## 2024-04-27 DIAGNOSIS — R161 Splenomegaly, not elsewhere classified: Secondary | ICD-10-CM

## 2024-05-09 ENCOUNTER — Other Ambulatory Visit: Payer: Self-pay

## 2024-05-09 ENCOUNTER — Encounter: Admitting: Family

## 2024-05-09 ENCOUNTER — Encounter: Payer: Self-pay | Admitting: Family

## 2024-05-09 DIAGNOSIS — E785 Hyperlipidemia, unspecified: Secondary | ICD-10-CM

## 2024-05-09 DIAGNOSIS — E1165 Type 2 diabetes mellitus with hyperglycemia: Secondary | ICD-10-CM

## 2024-05-09 MED ORDER — METFORMIN HCL 500 MG PO TABS: 500.0000 mg | ORAL_TABLET | Freq: Every day | ORAL | 0 refills | Status: AC

## 2024-05-09 MED ORDER — ATORVASTATIN CALCIUM 40 MG PO TABS: 40.0000 mg | ORAL_TABLET | Freq: Every day | ORAL | 0 refills | Status: AC

## 2024-05-09 NOTE — Progress Notes (Signed)
 Erroneous encounter-disregard

## 2024-05-11 ENCOUNTER — Ambulatory Visit (HOSPITAL_COMMUNITY)
Admission: RE | Admit: 2024-05-11 | Discharge: 2024-05-11 | Disposition: A | Discharge status: Home / Self Care | Source: Ambulatory Visit | Attending: Student in an Organized Health Care Education/Training Program | Admitting: Student in an Organized Health Care Education/Training Program

## 2024-05-11 DIAGNOSIS — I10 Essential (primary) hypertension: Secondary | ICD-10-CM | POA: Diagnosis present

## 2024-05-11 DIAGNOSIS — E785 Hyperlipidemia, unspecified: Secondary | ICD-10-CM | POA: Insufficient documentation

## 2024-05-11 DIAGNOSIS — I358 Other nonrheumatic aortic valve disorders: Secondary | ICD-10-CM | POA: Diagnosis present

## 2024-05-11 DIAGNOSIS — I77819 Aortic ectasia, unspecified site: Secondary | ICD-10-CM | POA: Diagnosis present

## 2024-05-11 DIAGNOSIS — E1169 Type 2 diabetes mellitus with other specified complication: Secondary | ICD-10-CM | POA: Diagnosis present

## 2024-05-11 MED ORDER — IOHEXOL 350 MG/ML SOLN
75.0000 mL | Freq: Once | INTRAVENOUS | Status: AC | PRN
  Administered 2024-05-11: 75 mL via INTRAVENOUS

## 2024-05-16 ENCOUNTER — Encounter

## 2024-05-16 DIAGNOSIS — C50411 Malignant neoplasm of upper-outer quadrant of right female breast: Secondary | ICD-10-CM

## 2024-10-12 ENCOUNTER — Ambulatory Visit: Admitting: Hematology and Oncology

## 2024-10-19 ENCOUNTER — Ambulatory Visit: Admitting: Pulmonary Disease
